# Patient Record
Sex: Male | Born: 1955 | Race: White | Hispanic: No | Marital: Single | Smoking: Former smoker
Health system: Southern US, Community
[De-identification: ages and names within clinical notes are randomized; demographics above are authoritative.]

## PROBLEM LIST (undated history)

## (undated) DIAGNOSIS — J40 Bronchitis, not specified as acute or chronic: Secondary | ICD-10-CM

## (undated) DIAGNOSIS — J449 Chronic obstructive pulmonary disease, unspecified: Secondary | ICD-10-CM

## (undated) DIAGNOSIS — G4730 Sleep apnea, unspecified: Secondary | ICD-10-CM

## (undated) DIAGNOSIS — I509 Heart failure, unspecified: Secondary | ICD-10-CM

## (undated) DIAGNOSIS — I10 Essential (primary) hypertension: Secondary | ICD-10-CM

## (undated) DIAGNOSIS — E119 Type 2 diabetes mellitus without complications: Secondary | ICD-10-CM

## (undated) DIAGNOSIS — E1142 Type 2 diabetes mellitus with diabetic polyneuropathy: Secondary | ICD-10-CM

## (undated) DIAGNOSIS — E7800 Pure hypercholesterolemia, unspecified: Secondary | ICD-10-CM

## (undated) DIAGNOSIS — G629 Polyneuropathy, unspecified: Secondary | ICD-10-CM

## (undated) HISTORY — DX: Chronic obstructive pulmonary disease, unspecified: J44.9

## (undated) HISTORY — PX: FEMUR SURGERY: SHX943

## (undated) HISTORY — PX: HAND SURGERY: SHX662

## (undated) HISTORY — PX: APPENDECTOMY: SHX54

## (undated) HISTORY — PX: TRACHEOSTOMY: SUR1362

## (undated) HISTORY — DX: Type 2 diabetes mellitus with diabetic polyneuropathy: E11.42

---

## 1998-07-15 ENCOUNTER — Encounter: Payer: Self-pay | Admitting: Emergency Medicine

## 1998-07-15 ENCOUNTER — Inpatient Hospital Stay: Admission: EM | Admit: 1998-07-15 | Discharge: 1998-08-06 | Payer: Self-pay | Admitting: Emergency Medicine

## 1998-07-16 ENCOUNTER — Encounter: Payer: Self-pay | Admitting: Pulmonary Disease

## 1998-07-17 ENCOUNTER — Encounter: Payer: Self-pay | Admitting: Pulmonary Disease

## 1998-07-19 ENCOUNTER — Encounter: Payer: Self-pay | Admitting: *Deleted

## 1998-07-20 ENCOUNTER — Encounter: Payer: Self-pay | Admitting: Pulmonary Disease

## 1998-10-08 ENCOUNTER — Encounter: Payer: Self-pay | Admitting: Emergency Medicine

## 1998-10-08 ENCOUNTER — Emergency Department (HOSPITAL_COMMUNITY): Admission: EM | Admit: 1998-10-08 | Discharge: 1998-10-08 | Payer: Self-pay | Admitting: Emergency Medicine

## 1998-11-08 ENCOUNTER — Ambulatory Visit (HOSPITAL_COMMUNITY): Admission: RE | Admit: 1998-11-08 | Discharge: 1998-11-08 | Payer: Self-pay | Admitting: *Deleted

## 1999-04-30 ENCOUNTER — Ambulatory Visit (HOSPITAL_COMMUNITY): Admission: RE | Admit: 1999-04-30 | Discharge: 1999-04-30 | Payer: Self-pay | Admitting: *Deleted

## 1999-04-30 ENCOUNTER — Encounter: Payer: Self-pay | Admitting: *Deleted

## 1999-05-24 ENCOUNTER — Emergency Department (HOSPITAL_COMMUNITY): Admission: EM | Admit: 1999-05-24 | Discharge: 1999-05-24 | Payer: Self-pay | Admitting: Emergency Medicine

## 1999-06-02 ENCOUNTER — Emergency Department (HOSPITAL_COMMUNITY): Admission: EM | Admit: 1999-06-02 | Discharge: 1999-06-02 | Payer: Self-pay | Admitting: Emergency Medicine

## 1999-07-15 ENCOUNTER — Emergency Department (HOSPITAL_COMMUNITY): Admission: EM | Admit: 1999-07-15 | Discharge: 1999-07-15 | Payer: Self-pay | Admitting: Emergency Medicine

## 2001-06-26 ENCOUNTER — Emergency Department (HOSPITAL_COMMUNITY): Admission: EM | Admit: 2001-06-26 | Discharge: 2001-06-26 | Payer: Self-pay | Admitting: Emergency Medicine

## 2001-06-26 ENCOUNTER — Encounter: Payer: Self-pay | Admitting: Emergency Medicine

## 2002-04-02 ENCOUNTER — Emergency Department (HOSPITAL_COMMUNITY): Admission: EM | Admit: 2002-04-02 | Discharge: 2002-04-02 | Payer: Self-pay | Admitting: Emergency Medicine

## 2003-03-23 ENCOUNTER — Emergency Department (HOSPITAL_COMMUNITY): Admission: EM | Admit: 2003-03-23 | Discharge: 2003-03-24 | Payer: Self-pay | Admitting: Emergency Medicine

## 2003-04-03 ENCOUNTER — Inpatient Hospital Stay (HOSPITAL_COMMUNITY): Admission: AC | Admit: 2003-04-03 | Discharge: 2003-04-19 | Payer: Self-pay

## 2003-04-24 ENCOUNTER — Encounter: Admission: RE | Admit: 2003-04-24 | Discharge: 2003-07-23 | Payer: Self-pay | Admitting: Emergency Medicine

## 2003-06-22 ENCOUNTER — Encounter: Admission: RE | Admit: 2003-06-22 | Discharge: 2003-09-20 | Payer: Self-pay | Admitting: Orthopedic Surgery

## 2003-09-01 ENCOUNTER — Ambulatory Visit (HOSPITAL_COMMUNITY): Admission: RE | Admit: 2003-09-01 | Discharge: 2003-09-02 | Payer: Self-pay | Admitting: Orthopedic Surgery

## 2003-09-01 HISTORY — PX: FEMUR HARDWARE REMOVAL: SUR1124

## 2003-09-21 ENCOUNTER — Encounter: Admission: RE | Admit: 2003-09-21 | Discharge: 2003-10-27 | Payer: Self-pay | Admitting: Orthopedic Surgery

## 2007-06-09 ENCOUNTER — Encounter: Payer: Self-pay | Admitting: Emergency Medicine

## 2007-06-09 ENCOUNTER — Ambulatory Visit: Admission: RE | Admit: 2007-06-09 | Discharge: 2007-06-09 | Payer: Self-pay | Admitting: Emergency Medicine

## 2007-06-09 ENCOUNTER — Ambulatory Visit: Payer: Self-pay | Admitting: *Deleted

## 2009-03-21 ENCOUNTER — Ambulatory Visit: Payer: Self-pay | Admitting: Family Medicine

## 2009-03-21 ENCOUNTER — Ambulatory Visit: Payer: Self-pay | Admitting: Cardiovascular Disease

## 2009-03-21 ENCOUNTER — Inpatient Hospital Stay (HOSPITAL_COMMUNITY): Admission: EM | Admit: 2009-03-21 | Discharge: 2009-03-24 | Payer: Self-pay | Admitting: Emergency Medicine

## 2009-03-23 ENCOUNTER — Encounter: Payer: Self-pay | Admitting: Family Medicine

## 2009-04-04 ENCOUNTER — Telehealth: Payer: Self-pay | Admitting: Critical Care Medicine

## 2010-04-04 NOTE — Progress Notes (Signed)
Summary: dme equipt  Phone Note Call from Patient   Caller: [email or web address]  Call For: Dayanis Bergquist Summary of Call: home health asking for orders for new humidity set up for trach, air compressor, new concentrator (his 55yrs old),need durable hosp bed pt has gained 123pds since he got his last bed needs new one  Initial call taken by: Oneita Jolly,  April 04, 2009 12:20 PM  Follow-up for Phone Call        I have absolutely no idea who this is,  I have never seen this patient   Follow-up by: Storm Frisk MD,  April 04, 2009 4:17 PM  Additional Follow-up for Phone Call Additional follow up Details #1::        well it was a pt set up in hosp and you consulted on the pt that is all i could find i can tell them to use prim dr if you  want me to Additional Follow-up by: Oneita Jolly,  April 04, 2009 4:37 PM    Additional Follow-up for Phone Call Additional follow up Details #2::    I just dont remember  just give them what they need  pt will need an outpt f/u OV   Follow-up by: Storm Frisk MD,  April 04, 2009 4:40 PM  Additional Follow-up for Phone Call Additional follow up Details #3:: Details for Additional Follow-up Action Taken: orders given to [facility] for the items they requested and pt will be made an appt to flu in office Additional Follow-up by: Oneita Jolly,  April 04, 2009 4:46 PM

## 2010-05-20 LAB — DIFFERENTIAL
Basophils Absolute: 0 10*3/uL (ref 0.0–0.1)
Basophils Relative: 0 % (ref 0–1)
Eosinophils Relative: 1 % (ref 0–5)
Lymphocytes Relative: 14 % (ref 12–46)
Monocytes Absolute: 0.5 10*3/uL (ref 0.1–1.0)

## 2010-05-20 LAB — COMPREHENSIVE METABOLIC PANEL
AST: 30 U/L (ref 0–37)
Alkaline Phosphatase: 95 U/L (ref 39–117)
BUN: 22 mg/dL (ref 6–23)
CO2: 34 mEq/L — ABNORMAL HIGH (ref 19–32)
Chloride: 91 mEq/L — ABNORMAL LOW (ref 96–112)
Creatinine, Ser: 1.01 mg/dL (ref 0.4–1.5)
GFR calc Af Amer: >60 mL/min (ref >60)
GFR calc non Af Amer: >60 mL/min (ref >60)
Potassium: 4.4 mEq/L (ref 3.5–5.1)
Total Bilirubin: 0.6 mg/dL (ref 0.3–1.2)

## 2010-05-20 LAB — BASIC METABOLIC PANEL WITH GFR
BUN: 31 mg/dL — ABNORMAL HIGH (ref 6–23)
CO2: 40 meq/L — ABNORMAL HIGH (ref 19–32)
Calcium: 8.9 mg/dL (ref 8.4–10.5)
Chloride: 90 meq/L — ABNORMAL LOW (ref 96–112)
Creatinine, Ser: 0.85 mg/dL (ref 0.4–1.5)
GFR calc non Af Amer: >60 mL/min
Glucose, Bld: 264 mg/dL — ABNORMAL HIGH (ref 70–99)
Potassium: 4.6 meq/L (ref 3.5–5.1)
Sodium: 136 meq/L (ref 135–145)

## 2010-05-20 LAB — GLUCOSE, CAPILLARY
Glucose-Capillary: 205 mg/dL — ABNORMAL HIGH (ref 70–99)
Glucose-Capillary: 263 mg/dL — ABNORMAL HIGH (ref 70–99)
Glucose-Capillary: 273 mg/dL — ABNORMAL HIGH (ref 70–99)
Glucose-Capillary: 283 mg/dL — ABNORMAL HIGH (ref 70–99)
Glucose-Capillary: 284 mg/dL — ABNORMAL HIGH (ref 70–99)
Glucose-Capillary: 285 mg/dL — ABNORMAL HIGH (ref 70–99)
Glucose-Capillary: 288 mg/dL — ABNORMAL HIGH (ref 70–99)
Glucose-Capillary: 290 mg/dL — ABNORMAL HIGH (ref 70–99)
Glucose-Capillary: 334 mg/dL — ABNORMAL HIGH (ref 70–99)
Glucose-Capillary: 344 mg/dL — ABNORMAL HIGH (ref 70–99)
Glucose-Capillary: 384 mg/dL — ABNORMAL HIGH (ref 70–99)
Glucose-Capillary: 413 mg/dL — ABNORMAL HIGH (ref 70–99)

## 2010-05-20 LAB — BASIC METABOLIC PANEL
BUN: 40 mg/dL — ABNORMAL HIGH (ref 6–23)
Calcium: 8.8 mg/dL (ref 8.4–10.5)
Chloride: 89 mEq/L — ABNORMAL LOW (ref 96–112)
GFR calc non Af Amer: >60 mL/min (ref >60)
Glucose, Bld: 304 mg/dL — ABNORMAL HIGH (ref 70–99)
Potassium: 5.1 mEq/L (ref 3.5–5.1)
Potassium: 5.1 mEq/L (ref 3.5–5.1)

## 2010-05-20 LAB — CBC
HCT: 44.5 % (ref 39.0–52.0)
HCT: 45.2 % (ref 39.0–52.0)
HCT: 48.3 % (ref 39.0–52.0)
HCT: 49 % (ref 39.0–52.0)
Hemoglobin: 14.6 g/dL (ref 13.0–17.0)
Hemoglobin: 15 g/dL (ref 13.0–17.0)
Hemoglobin: 15.9 g/dL (ref 13.0–17.0)
Hemoglobin: 16.2 g/dL (ref 13.0–17.0)
MCHC: 32.8 g/dL (ref 30.0–36.0)
MCHC: 32.9 g/dL (ref 30.0–36.0)
MCHC: 33.2 g/dL (ref 30.0–36.0)
MCV: 87.8 fL (ref 78.0–100.0)
MCV: 88.9 fL (ref 78.0–100.0)
MCV: 89 fL (ref 78.0–100.0)
MCV: 89.3 fL (ref 78.0–100.0)
Platelets: 181 K/uL (ref 150–400)
Platelets: 185 10*3/uL (ref 150–400)
Platelets: 197 K/uL (ref 150–400)
Platelets: 203 K/uL (ref 150–400)
RBC: 5 MIL/uL (ref 4.22–5.81)
RBC: 5.08 MIL/uL (ref 4.22–5.81)
RBC: 5.42 MIL/uL (ref 4.22–5.81)
RBC: 5.58 MIL/uL (ref 4.22–5.81)
RDW: 17 % — ABNORMAL HIGH (ref 11.5–15.5)
RDW: 17.3 % — ABNORMAL HIGH (ref 11.5–15.5)
RDW: 17.8 % — ABNORMAL HIGH (ref 11.5–15.5)
WBC: 10.7 K/uL — ABNORMAL HIGH (ref 4.0–10.5)
WBC: 12.1 K/uL — ABNORMAL HIGH (ref 4.0–10.5)
WBC: 12.4 K/uL — ABNORMAL HIGH (ref 4.0–10.5)
WBC: 14.9 10*3/uL — ABNORMAL HIGH (ref 4.0–10.5)

## 2010-05-20 LAB — BRAIN NATRIURETIC PEPTIDE: Pro B Natriuretic peptide (BNP): 141 pg/mL — ABNORMAL HIGH (ref 0.0–100.0)

## 2010-05-20 LAB — CULTURE, RESPIRATORY W GRAM STAIN

## 2010-05-20 LAB — POCT I-STAT 3, ART BLOOD GAS (G3+)
Acid-Base Excess: 10 mmol/L — ABNORMAL HIGH (ref 0.0–2.0)
Bicarbonate: 38.7 meq/L — ABNORMAL HIGH (ref 20.0–24.0)
O2 Saturation: 91 %
Patient temperature: 99.5
TCO2: 41 mmol/L (ref 0–100)
pCO2 arterial: 63.1 mmHg (ref 35.0–45.0)
pH, Arterial: 7.398 (ref 7.350–7.450)
pO2, Arterial: 65 mmHg — ABNORMAL LOW (ref 80.0–100.0)

## 2010-05-20 LAB — POCT CARDIAC MARKERS
CKMB, poc: 4.2 ng/mL (ref 1.0–8.0)
Myoglobin, poc: 316 ng/mL (ref 12–200)
Troponin i, poc: <0.05 ng/mL (ref 0.00–0.09)

## 2010-05-20 LAB — HEMOGLOBIN A1C
Hgb A1c MFr Bld: 10 % — ABNORMAL HIGH (ref 4.6–6.1)
Mean Plasma Glucose: 240 mg/dL

## 2010-06-10 ENCOUNTER — Other Ambulatory Visit: Payer: Self-pay | Admitting: Family Medicine

## 2010-07-19 NOTE — Op Note (Signed)
[place]  Patient:    AMANTE, FOMBY                         MRN: [id number] Proc. Date: 04/30/99 Adm. Date:  [id number] Disc. Date: [id number] Attending:  Harmon Pier CC:         Cornelius Moras. Egbert Garibaldi, D.D.S.                           Operative Report  PREOPERATIVE DIAGNOSIS:  Dental caries, teeth numbers 1, 2, 3, 4, 13, 14, 15, 16K and 32, impacted tooth #17, sleep apnea.  POSTOPERATIVE DIAGNOSIS:  Dental caries, teeth numbers 1, 2, 3, 4, 13, 14, 15, 6K and 32, impacted tooth #17, sleep apnea.  OPERATION PERFORMED:  Surgical extraction of teeth numbers 1, 2, 3, 4, 13, 14, 5, 16, 17, 32 and K.  SURGEON:  Scott R. Egbert Garibaldi, D.D.S.  ANESTHESIA:  General tracheostomy tube.  PREOPERATIVE MEDICINES:  Ancef 1 gm IV piggyback.  INDICATIONS FOR PROCEDURE:  Antavious is a 55 year old Caucasian male referred by ____________ for removal of multiple teeth.  Marks teeth have been hurting on and off for some time.  He does have a history of sleep apnea and obesity, and congestive heart failure.  He has a permanent tracheostomy site.  PAST MEDICAL HISTORY:  No known drug allergies.  MEDICATIONS ON ADMISSION:  Lasix, Prilosec, guanefesin.  HOSPITALIZATIONS:  Numerous surgeries but no joints replacements or valve replacements reported.  He has had a tracheostomy site placed by Dr. Carlena Sax for his sleep apnea disorder.  He does have a history of obesity, congestive heart failure, and sleep apnea as reported.  CLINICAL EXAM:  Extraoral.  A large 55 year old Caucasian male.  Intraoral. Gross caries in numbers 1, 2, 3, 4, 13, 14, 15 and 16 K.  Panorex revealed impacted tooth #17.  Decision was made to do Marks procedure in the main operating room due to  his sleep apnea and obesity conditions.  DESCRIPTION OF PROCEDURE:  The patient was brought to the main operating room, placed in supine position.  The tracheostomy tube was then changed to  a corrugated tracheostomy tube with a cuff on and anesthesia was then induced via IV and inhalational techniques.  At this point the patient was prepped and draped in standard fashion for an intraoral maxillofacial surgical procedure and the oral  cavity was then suctioned and throat pack was placed.  2% Xylocaine with 1:100,000 epinephrine total of 8 cc was given bilateral to inferior alveolar, along the buccal posterior superior alveolar, greater palatine an minor buccal infiltration was done in the maxillary right and left regions.  A #15 blade was then used to  incise from tooth #1 to tooth #4 area where a periosteal elevator was used to reflect the buccal tissues gently.  A #8 round bur under copious normal saline irrigation was then used for minor osseous reduction around tooth numbers 1, 2, 3, and 4.  Using a straight elevator and an upper anatomic forcep for tooth #3, the tooth was then removed.  The palatal root and the mesobuccal root were fractured an using a surgical handpiece, copious normal saline irrigation, minor osseous reduction was then done and a periosteal elevator was used to retrieve the mesial buccal and the palatal roots without complications.  Tooth numbers 1, 2 and 4 were then removed using a straight elevator and a ronguer.  A minor alveoloplasty was then done.  Curettage, irrigation and suture with 3-0 chromic gut suture x 4. t this point the attention was then drawn to the low right side where a full thickness mucoperiosteal flap was advanced for teeth numbers 31 and 32.  Then using a handpiece under copious normal saline irrigation, a trough was then cut on on the buccal tooth 32 at which point the [facility] elevator was used to elevate tooth #32 out of the socket.  Once again, curettage followed and then using a rongeur and light irrigation one 3-0 chromic gut was placed on the patients right side.  At this point the bite block was then transferred  from the patients left side to the right side and the upper left region then addressed.  Using a #15 blade, an incision as then done from tooth #16 to tooth 13 area on the buccal and a periosteal elevator was used to retract the buccal tissue without complications.  Using a #8 round ur and a copious amount of saline irrigation, a buccal trial was then cut from tooth numbers 13, 14, 15 and 16 using a straight elevator and a rongeur.  Tooth numbers 13, 15, and 16 were then removed in segments without problems.  Using a straight elevator followed by upper left hand ____________ forcep tooth #14 was then removed without complications.  Alveoloplasty was then done followed by copious  normal saline irrigation and continuous interlocking sutures then placed in the  patients upper left region.  At this point attention was then drawn to tooth K, at which point a full thickness mucoperiosteal flap was then elevated using a #15 blade and periosteal and using a straight elevator, tooth #K was removed.  A small portion of mesial root was noted to be fractured into the socket at which point a #8 round bur was used to further remove osseous structure, then elevation of a small fragment was then done with a periosteal elevator. Normal saline irrigation followed and then a 3-0 chromic gut suture was then placed.  At this point tooth #17 was then addressed.  A full thickness mucoperiosteal flap was then done from 17 to 18 and a copious normal saline irrigation was then used with a #8 round bur nd a buccal trough was cut around tooth #17.  Minor elevation was then done and tooth #17 was removed without complication.  Curettage followed and irrigation and then one 3-0 chromic gut in a figure-of-eight fashion was then done in this region. The surgical procedure was then found to be complete.  The oral cavity was suctioned, the throat pack was removed and Vaseline was placed on the patients  lips. The patient was then transferred over to his normal uncuffed trach tube and transported to recovery room.  Estimated blood loss for this procedure was 15 cc.  Urine output due to void.  Fluid replacement approximately 500 cc. DD:  04/30/99  TD:  04/30/99 Job: [id number]

## 2010-07-19 NOTE — Op Note (Signed)
NAME:  Tyrone Cunningham, OHLIN                            ACCOUNT NO.:  [id number]   MEDICAL RECORD NO.:  [id number]                   PATIENT TYPE:  OIB   LOCATION:  5019                                 FACILITY:  [facility]   PHYSICIAN:  Elana Alm. Thurston Hole, M.D.              DATE OF BIRTH:  04-14-1955   DATE OF PROCEDURE:  09/01/2003  DATE OF DISCHARGE:                                 OPERATIVE REPORT   PREOPERATIVE DIAGNOSIS:  Retained hardware, left distal femur.   POSTOPERATIVE DIAGNOSIS:  Retained hardware, left distal femur.   PROCEDURE:  Removal of retained hardware, two interlocking distal femoral  rod screws.   SURGEON:  Elana Alm. Thurston Hole, M.D.   ASSISTANT:  Julien Girt, P.A.   ANESTHESIA:  General.   Operative time of 30 minutes.   COMPLICATIONS:  None.   DESCRIPTION OF PROCEDURE:  Mr. Allende was brought to the operating room on  September 01, 2003, and placed on the operating table in supine position.  After  an adequate level of general anesthesia was obtained, he received Ancef 1 g  IV preoperatively for prophylaxis.  His left knee was examined under  anesthesia.  Range of motion 0-120 degrees and knee stable to ligamentous  exam.  Left leg was prepped using sterile Duraprep and draped using sterile  technique after a Foley catheter was placed under sterile conditions.  Initially using fluoroscopy and through his previous distal lateral  incision, a new 3-4 cm incision was made.  The underlying subcutaneous  tissues were incised along with the skin incision.  Using fluoroscopic  control, the two distal interlocking femoral rod screws were removed in  order to dynamize the femoral rod and help improve healing of the femoral  shaft fracture.  These were removed without complications.  The wound was  then irrigated and closed using 2-0 Vicryl and skin staples.  Sterile  dressings were applied.  The patient was then awakened and extubated and  taken to the recovery room in stable  condition.  Needle and sponge counts  correct x2 at the end of the case.                                               Robert A. Thurston Hole, M.D.    RAW/MEDQ  D:  09/01/2003  T:  09/02/2003  Job:  [id number]

## 2010-08-14 ENCOUNTER — Other Ambulatory Visit: Payer: Self-pay | Admitting: Family Medicine

## 2011-02-18 ENCOUNTER — Ambulatory Visit (INDEPENDENT_AMBULATORY_CARE_PROVIDER_SITE_OTHER): Payer: Medicare Other | Admitting: Emergency Medicine

## 2011-02-18 ENCOUNTER — Ambulatory Visit: Payer: Self-pay | Admitting: Emergency Medicine

## 2011-02-18 DIAGNOSIS — R070 Pain in throat: Secondary | ICD-10-CM

## 2011-02-18 DIAGNOSIS — E119 Type 2 diabetes mellitus without complications: Secondary | ICD-10-CM

## 2011-03-05 ENCOUNTER — Ambulatory Visit (INDEPENDENT_AMBULATORY_CARE_PROVIDER_SITE_OTHER): Payer: Medicare Other

## 2011-03-05 DIAGNOSIS — E1065 Type 1 diabetes mellitus with hyperglycemia: Secondary | ICD-10-CM | POA: Diagnosis not present

## 2011-03-05 DIAGNOSIS — R070 Pain in throat: Secondary | ICD-10-CM | POA: Diagnosis not present

## 2011-03-05 DIAGNOSIS — E119 Type 2 diabetes mellitus without complications: Secondary | ICD-10-CM | POA: Diagnosis not present

## 2011-03-05 DIAGNOSIS — R05 Cough: Secondary | ICD-10-CM

## 2011-03-09 ENCOUNTER — Emergency Department (HOSPITAL_COMMUNITY): Payer: Medicare Other

## 2011-03-09 ENCOUNTER — Inpatient Hospital Stay (HOSPITAL_COMMUNITY)
Admission: EM | Admit: 2011-03-09 | Discharge: 2011-03-15 | DRG: 389 | Disposition: A | Discharge status: Home / Self Care | Payer: Medicare Other | Source: Ambulatory Visit | Attending: Family Medicine | Admitting: Family Medicine

## 2011-03-09 ENCOUNTER — Ambulatory Visit (INDEPENDENT_AMBULATORY_CARE_PROVIDER_SITE_OTHER): Payer: Medicare Other

## 2011-03-09 ENCOUNTER — Other Ambulatory Visit: Payer: Self-pay

## 2011-03-09 ENCOUNTER — Encounter: Payer: Self-pay | Admitting: Emergency Medicine

## 2011-03-09 DIAGNOSIS — I10 Essential (primary) hypertension: Secondary | ICD-10-CM | POA: Diagnosis present

## 2011-03-09 DIAGNOSIS — K565 Intestinal adhesions [bands], unspecified as to partial versus complete obstruction: Secondary | ICD-10-CM | POA: Diagnosis present

## 2011-03-09 DIAGNOSIS — K828 Other specified diseases of gallbladder: Secondary | ICD-10-CM | POA: Diagnosis present

## 2011-03-09 DIAGNOSIS — K5900 Constipation, unspecified: Secondary | ICD-10-CM | POA: Diagnosis present

## 2011-03-09 DIAGNOSIS — H9190 Unspecified hearing loss, unspecified ear: Secondary | ICD-10-CM | POA: Diagnosis present

## 2011-03-09 DIAGNOSIS — K56609 Unspecified intestinal obstruction, unspecified as to partial versus complete obstruction: Secondary | ICD-10-CM | POA: Diagnosis present

## 2011-03-09 DIAGNOSIS — K7689 Other specified diseases of liver: Secondary | ICD-10-CM | POA: Diagnosis present

## 2011-03-09 DIAGNOSIS — R0902 Hypoxemia: Secondary | ICD-10-CM | POA: Diagnosis not present

## 2011-03-09 DIAGNOSIS — Z09 Encounter for follow-up examination after completed treatment for conditions other than malignant neoplasm: Secondary | ICD-10-CM | POA: Diagnosis not present

## 2011-03-09 DIAGNOSIS — R4589 Other symptoms and signs involving emotional state: Secondary | ICD-10-CM | POA: Diagnosis present

## 2011-03-09 DIAGNOSIS — Z87891 Personal history of nicotine dependence: Secondary | ICD-10-CM | POA: Diagnosis not present

## 2011-03-09 DIAGNOSIS — G4730 Sleep apnea, unspecified: Secondary | ICD-10-CM | POA: Diagnosis present

## 2011-03-09 DIAGNOSIS — E860 Dehydration: Secondary | ICD-10-CM | POA: Diagnosis not present

## 2011-03-09 DIAGNOSIS — K3189 Other diseases of stomach and duodenum: Secondary | ICD-10-CM | POA: Diagnosis present

## 2011-03-09 DIAGNOSIS — Z23 Encounter for immunization: Secondary | ICD-10-CM | POA: Diagnosis not present

## 2011-03-09 DIAGNOSIS — R509 Fever, unspecified: Secondary | ICD-10-CM

## 2011-03-09 DIAGNOSIS — E119 Type 2 diabetes mellitus without complications: Secondary | ICD-10-CM | POA: Diagnosis present

## 2011-03-09 DIAGNOSIS — I509 Heart failure, unspecified: Secondary | ICD-10-CM | POA: Diagnosis not present

## 2011-03-09 DIAGNOSIS — Z794 Long term (current) use of insulin: Secondary | ICD-10-CM | POA: Diagnosis not present

## 2011-03-09 DIAGNOSIS — R142 Eructation: Secondary | ICD-10-CM | POA: Diagnosis not present

## 2011-03-09 DIAGNOSIS — I517 Cardiomegaly: Secondary | ICD-10-CM | POA: Diagnosis not present

## 2011-03-09 DIAGNOSIS — E7800 Pure hypercholesterolemia, unspecified: Secondary | ICD-10-CM | POA: Diagnosis present

## 2011-03-09 DIAGNOSIS — Z930 Tracheostomy status: Secondary | ICD-10-CM

## 2011-03-09 DIAGNOSIS — J029 Acute pharyngitis, unspecified: Secondary | ICD-10-CM | POA: Diagnosis not present

## 2011-03-09 DIAGNOSIS — J8410 Pulmonary fibrosis, unspecified: Secondary | ICD-10-CM | POA: Diagnosis present

## 2011-03-09 DIAGNOSIS — G609 Hereditary and idiopathic neuropathy, unspecified: Secondary | ICD-10-CM | POA: Diagnosis present

## 2011-03-09 DIAGNOSIS — R141 Gas pain: Secondary | ICD-10-CM | POA: Diagnosis not present

## 2011-03-09 DIAGNOSIS — Z6841 Body Mass Index (BMI) 40.0 and over, adult: Secondary | ICD-10-CM

## 2011-03-09 DIAGNOSIS — G8929 Other chronic pain: Secondary | ICD-10-CM | POA: Diagnosis present

## 2011-03-09 DIAGNOSIS — J40 Bronchitis, not specified as acute or chronic: Secondary | ICD-10-CM | POA: Diagnosis not present

## 2011-03-09 DIAGNOSIS — E785 Hyperlipidemia, unspecified: Secondary | ICD-10-CM | POA: Diagnosis present

## 2011-03-09 DIAGNOSIS — R109 Unspecified abdominal pain: Secondary | ICD-10-CM | POA: Diagnosis not present

## 2011-03-09 HISTORY — DX: Heart failure, unspecified: I50.9

## 2011-03-09 HISTORY — DX: Bronchitis, not specified as acute or chronic: J40

## 2011-03-09 HISTORY — DX: Sleep apnea, unspecified: G47.30

## 2011-03-09 HISTORY — DX: Essential (primary) hypertension: I10

## 2011-03-09 HISTORY — DX: Polyneuropathy, unspecified: G62.9

## 2011-03-09 HISTORY — DX: Pure hypercholesterolemia, unspecified: E78.00

## 2011-03-09 LAB — COMPREHENSIVE METABOLIC PANEL
ALT: 23 U/L (ref 0–53)
AST: 17 U/L (ref 0–37)
Alkaline Phosphatase: 72 U/L (ref 39–117)
Calcium: 10.9 mg/dL — ABNORMAL HIGH (ref 8.4–10.5)
GFR calc Af Amer: >90 mL/min (ref >90)
Glucose, Bld: 241 mg/dL — ABNORMAL HIGH (ref 70–99)
Potassium: 4.3 mEq/L (ref 3.5–5.1)
Sodium: 135 mEq/L (ref 135–145)
Total Protein: 8.7 g/dL — ABNORMAL HIGH (ref 6.0–8.3)

## 2011-03-09 LAB — DIFFERENTIAL
Basophils Absolute: 0 10*3/uL (ref 0.0–0.1)
Eosinophils Absolute: 0 10*3/uL (ref 0.0–0.7)
Lymphocytes Relative: 8 % — ABNORMAL LOW (ref 12–46)
Lymphs Abs: 1.1 10*3/uL (ref 0.7–4.0)
Neutrophils Relative %: 89 % — ABNORMAL HIGH (ref 43–77)

## 2011-03-09 LAB — CBC
Platelets: 197 10*3/uL (ref 150–400)
RBC: 5.36 MIL/uL (ref 4.22–5.81)
RDW: 14.9 % (ref 11.5–15.5)
WBC: 13.7 10*3/uL — ABNORMAL HIGH (ref 4.0–10.5)

## 2011-03-09 MED ORDER — HYDROMORPHONE HCL PF 1 MG/ML IJ SOLN
1.0000 mg | Freq: Once | INTRAMUSCULAR | Status: AC
  Administered 2011-03-09: 1 mg via INTRAVENOUS
  Filled 2011-03-09: qty 1

## 2011-03-09 MED ORDER — ONDANSETRON HCL 4 MG PO TABS: 4.0000 mg | ORAL_TABLET | Freq: Four times a day (QID) | ORAL | Status: DC | PRN

## 2011-03-09 MED ORDER — IOHEXOL 300 MG/ML  SOLN
40.0000 mL | Freq: Once | INTRAMUSCULAR | Status: AC | PRN
  Administered 2011-03-09: 40 mL via ORAL

## 2011-03-09 MED ORDER — ONDANSETRON HCL 4 MG/2ML IJ SOLN
4.0000 mg | Freq: Once | INTRAMUSCULAR | Status: AC
  Administered 2011-03-09: 4 mg via INTRAVENOUS
  Filled 2011-03-09: qty 2

## 2011-03-09 MED ORDER — SODIUM CHLORIDE 0.9 % IV BOLUS (SEPSIS)
500.0000 mL | INTRAVENOUS | Status: AC
  Administered 2011-03-09: 500 mL via INTRAVENOUS

## 2011-03-09 MED ORDER — GABAPENTIN 300 MG PO CAPS
300.0000 mg | ORAL_CAPSULE | Freq: Three times a day (TID) | ORAL | Status: DC
  Administered 2011-03-10 (×2): 300 mg via ORAL
  Filled 2011-03-09 (×4): qty 1

## 2011-03-09 MED ORDER — SODIUM CHLORIDE 0.9 % IV SOLN: Freq: Once | INTRAVENOUS | Status: DC

## 2011-03-09 MED ORDER — IOHEXOL 300 MG/ML  SOLN
100.0000 mL | Freq: Once | INTRAMUSCULAR | Status: AC | PRN
  Administered 2011-03-09: 100 mL via INTRAVENOUS

## 2011-03-09 MED ORDER — INSULIN ASPART 100 UNIT/ML ~~LOC~~ SOLN
0.0000 [IU] | Freq: Three times a day (TID) | SUBCUTANEOUS | Status: DC
  Administered 2011-03-10 (×2): 3 [IU] via SUBCUTANEOUS
  Filled 2011-03-09: qty 3

## 2011-03-09 MED ORDER — PANTOPRAZOLE SODIUM 40 MG IV SOLR
40.0000 mg | INTRAVENOUS | Status: DC
  Administered 2011-03-10 – 2011-03-11 (×3): 40 mg via INTRAVENOUS
  Filled 2011-03-09 (×4): qty 40

## 2011-03-09 MED ORDER — PNEUMOCOCCAL VAC POLYVALENT 25 MCG/0.5ML IJ INJ
0.5000 mL | INJECTION | INTRAMUSCULAR | Status: AC
  Administered 2011-03-10: 0.5 mL via INTRAMUSCULAR
  Filled 2011-03-09: qty 0.5

## 2011-03-09 MED ORDER — FLUTICASONE-SALMETEROL 500-50 MCG/DOSE IN AEPB
1.0000 | INHALATION_SPRAY | Freq: Two times a day (BID) | RESPIRATORY_TRACT | Status: DC
  Administered 2011-03-09 – 2011-03-15 (×12): 1 via RESPIRATORY_TRACT
  Filled 2011-03-09: qty 14

## 2011-03-09 MED ORDER — HEPARIN SODIUM (PORCINE) 5000 UNIT/ML IJ SOLN
5000.0000 [IU] | Freq: Three times a day (TID) | INTRAMUSCULAR | Status: DC
  Administered 2011-03-10 – 2011-03-15 (×16): 5000 [IU] via SUBCUTANEOUS
  Filled 2011-03-09 (×19): qty 1

## 2011-03-09 MED ORDER — FUROSEMIDE 40 MG PO TABS
40.0000 mg | ORAL_TABLET | Freq: Every day | ORAL | Status: DC
  Administered 2011-03-10: 40 mg via ORAL
  Filled 2011-03-09: qty 1

## 2011-03-09 MED ORDER — ONDANSETRON HCL 4 MG/2ML IJ SOLN: 4.0000 mg | Freq: Four times a day (QID) | INTRAMUSCULAR | Status: DC | PRN

## 2011-03-09 MED ORDER — LISINOPRIL 5 MG PO TABS
5.0000 mg | ORAL_TABLET | Freq: Every day | ORAL | Status: DC
  Administered 2011-03-10: 5 mg via ORAL
  Filled 2011-03-09: qty 1

## 2011-03-09 MED ORDER — ALBUTEROL SULFATE HFA 108 (90 BASE) MCG/ACT IN AERS
2.0000 | INHALATION_SPRAY | RESPIRATORY_TRACT | Status: DC | PRN
  Administered 2011-03-09: 2 via RESPIRATORY_TRACT
  Filled 2011-03-09: qty 6.7

## 2011-03-09 MED ORDER — SODIUM CHLORIDE 0.9 % IV SOLN
INTRAVENOUS | Status: DC
  Administered 2011-03-09 – 2011-03-13 (×7): via INTRAVENOUS

## 2011-03-09 MED ORDER — ALBUTEROL SULFATE (5 MG/ML) 0.5% IN NEBU
INHALATION_SOLUTION | RESPIRATORY_TRACT | Status: AC
  Administered 2011-03-09: 19:00:00
  Filled 2011-03-09: qty 1

## 2011-03-09 MED ORDER — SODIUM CHLORIDE 0.9 % IV BOLUS (SEPSIS)
1000.0000 mL | Freq: Once | INTRAVENOUS | Status: AC
  Administered 2011-03-09: 1000 mL via INTRAVENOUS

## 2011-03-09 MED ORDER — HYDROCODONE-ACETAMINOPHEN 5-325 MG PO TABS: 1.0000 | ORAL_TABLET | Freq: Four times a day (QID) | ORAL | Status: DC | PRN

## 2011-03-09 MED ORDER — HYDROMORPHONE HCL PF 1 MG/ML IJ SOLN
1.0000 mg | INTRAMUSCULAR | Status: DC | PRN
  Administered 2011-03-10 – 2011-03-12 (×14): 1 mg via INTRAVENOUS
  Filled 2011-03-09 (×15): qty 1

## 2011-03-09 NOTE — ED Notes (Signed)
Pt cleans trach per self with home bristled. Thick yellow/gray sputum noted.

## 2011-03-09 NOTE — ED Notes (Signed)
Pt talking, ;aughing states I feel so much better

## 2011-03-09 NOTE — ED Provider Notes (Signed)
Medical screening examination/treatment/procedure(s) were performed by non-physician practitioner and as supervising physician I was immediately available for consultation/collaboration.   Guss Farruggia, MD 03/09/11 1704 

## 2011-03-09 NOTE — ED Notes (Signed)
Pt on continuous SPo2 monitor. Pts primary complaint is abdominal pain. Due to size pt has difficulty find position of comfort in stretcher.Reposition pt frequently per request. Resp informed of pt. PA at bedside on arrival.

## 2011-03-09 NOTE — ED Notes (Signed)
Assumed care of pt.  No distress noted.  Pt reports that he is in pain but is noted to be laughing and sitting up talking with friends.  Skin warm, dry and intact.  Neuro intact.

## 2011-03-09 NOTE — ED Notes (Signed)
Patient transported to X-ray on trach collar with o2 intact.

## 2011-03-09 NOTE — ED Notes (Signed)
Pt on blood pressure cuff and continuous pulse oximetry and NRB w/mask over his trach

## 2011-03-09 NOTE — ED Notes (Signed)
Resp at bedside

## 2011-03-09 NOTE — H&P (Signed)
Tyrone Cunningham is an 56 y.o. male.   PCP[facility] Chief Complaint: abdominal pain HPI: This is a 56 YO Caucasian male presenting with the above.  He first started experiencing abdominal pain on Thursday but did not become severe until late Friday. It has been worsening since then. He last ate yesterday around 6 pm because of the abdominal pain which makes it difficult to eat.  He reports this normally happens every 2-3 years. He usually takes Metamucil and it resolves in the next 1-2 days.  This time, the abdominal pain did not resolve. He saw PCP Dr. Cleta Alberts today who was concerned and sent him to the ED by ambulance.   Last had a bowel movement on Wednesday or Thursday. "It wasn't as good as it usually is" when asked about amount and quality of stool.  Difficulty tolerating liquids as well. Last drank about 12 hours ago.   At the time of this interview, patient feels significantly improved ("100% better") since he first came to the ED. The NGT has helped him the most.   ROS: "Very little bit" of nausea this morning Fevers? "Feels hot and sweaty" Denies any new difficulty breathing Last took home medications this morning  Past Medical History  Diagnosis Date  . Bronchitis   . CHF (congestive heart failure)   . Diabetes mellitus   . Sleep apnea   . Hypertension   . Hypercholesteremia   . Hearing loss   . Peripheral neuropathy   . Sleep apnea     Severe sleep apnea requiring tracheostomy  Chronic pain (back, legs, feet, arms)  Past Surgical History  Procedure Date  . Tracheostomy   . Appendectomy   Repair of deviated septum Ear tubes in the past bilaterally for multiple ear infections Tonsillectomy  History reviewed. No pertinent family history. Social History:  reports that he has quit smoking. He has never used smokeless tobacco. His alcohol and drug histories not on file. Lives alone in [place]. Has housekeeper who comes in occasionally.  Quit smoking  12-13 years ago. Had smoked > 1 ppd. Denies recent illicit drug use. Denies drinking alcohol since 30 years ago.   Allergies: No Known Allergies  Medications Prior to Admission  Medication Dose Route Frequency Provider Last Rate Last Dose  . 0.9 %  sodium chloride infusion   Intravenous Once Grant Fontana, [place] 10 mL/hr at 03/09/11 1559    . albuterol (PROVENTIL) (5 MG/ML) 0.5% nebulizer solution           . HYDROmorphone (DILAUDID) injection 1 mg  1 mg Intravenous Once Grant Fontana, PA   1 mg at 03/09/11 1344  . HYDROmorphone (DILAUDID) injection 1 mg  1 mg Intravenous Once Grant Fontana, PA   1 mg at 03/09/11 1517  . HYDROmorphone (DILAUDID) injection 1 mg  1 mg Intravenous Once Grant Fontana, PA   1 mg at 03/09/11 1647  . HYDROmorphone (DILAUDID) injection 1 mg  1 mg Intravenous Once [employer], PA      . iohexol (OMNIPAQUE) 300 MG/ML solution 100 mL  100 mL Intravenous Once PRN Medication Radiologist   100 mL at 03/09/11 1802  . iohexol (OMNIPAQUE) 300 MG/ML solution 40 mL  40 mL Oral Once PRN Medication Radiologist   40 mL at 03/09/11 1550  . ondansetron (ZOFRAN) injection 4 mg  4 mg Intravenous Once Grant Fontana, [place]   4 mg at 03/09/11 1349  . ondansetron (ZOFRAN) injection 4 mg  4  mg Intravenous Once Grant Fontana, [place]   4 mg at 03/09/11 1645  . sodium chloride 0.9 % bolus 1,000 mL  1,000 mL Intravenous Once Grant Fontana, PA   1,000 mL at 03/09/11 1350  . sodium chloride 0.9 % bolus 500 mL  500 mL Intravenous STAT Jennifer A Pitylak, PA   500 mL at 03/09/11 1758  . DISCONTD: 0.9 %  sodium chloride infusion   Intravenous Once Grant Fontana, [place]       No current outpatient prescriptions on file as of 03/09/2011.    Results for orders placed during the hospital encounter of 03/09/11 (from the past 48 hour(s))  CBC     Status: Abnormal   Collection Time   03/09/11  2:11 PM      Component Value Range Comment   WBC 13.7 (*) 4.0 - 10.5 (K/uL)     RBC 5.36  4.22 - 5.81 (MIL/uL)    Hemoglobin 16.1  13.0 - 17.0 (g/dL)    HCT 16.1  09.6 - 04.5 (%)    MCV 87.9  78.0 - 100.0 (fL)    MCH 30.0  26.0 - 34.0 (pg)    MCHC 34.2  30.0 - 36.0 (g/dL)    RDW 40.9  81.1 - 91.4 (%)    Platelets 197  150 - 400 (K/uL)   DIFFERENTIAL     Status: Abnormal   Collection Time   03/09/11  2:11 PM      Component Value Range Comment   Neutrophils Relative 89 (*) 43 - 77 (%)    Neutro Abs 12.2 (*) 1.7 - 7.7 (K/uL)    Lymphocytes Relative 8 (*) 12 - 46 (%)    Lymphs Abs 1.1  0.7 - 4.0 (K/uL)    Monocytes Relative 3  3 - 12 (%)    Monocytes Absolute 0.3  0.1 - 1.0 (K/uL)    Eosinophils Relative 0  0 - 5 (%)    Eosinophils Absolute 0.0  0.0 - 0.7 (K/uL)    Basophils Relative 0  0 - 1 (%)    Basophils Absolute 0.0  0.0 - 0.1 (K/uL)   COMPREHENSIVE METABOLIC PANEL     Status: Abnormal   Collection Time   03/09/11  2:11 PM      Component Value Range Comment   Sodium 135  135 - 145 (mEq/L)    Potassium 4.3  3.5 - 5.1 (mEq/L)    Chloride 91 (*) 96 - 112 (mEq/L)    CO2 33 (*) 19 - 32 (mEq/L)    Glucose, Bld 241 (*) 70 - 99 (mg/dL)    BUN 28 (*) 6 - 23 (mg/dL)    Creatinine, Ser 7.82  0.50 - 1.35 (mg/dL)    Calcium 95.6 (*) 8.4 - 10.5 (mg/dL)    Total Protein 8.7 (*) 6.0 - 8.3 (g/dL)    Albumin 3.8  3.5 - 5.2 (g/dL)    AST 17  0 - 37 (U/L)    ALT 23  0 - 53 (U/L)    Alkaline Phosphatase 72  39 - 117 (U/L)    Total Bilirubin 0.4  0.3 - 1.2 (mg/dL)    GFR calc non Af Amer >90  >90 (mL/min)    GFR calc Af Amer >90  >90 (mL/min)    Ct Abdomen Pelvis W Contrast  03/09/2011  *RADIOLOGY REPORT*  Clinical Data: Abdominal distention.  CT ABDOMEN AND PELVIS WITH CONTRAST  Technique:  Multidetector CT imaging of the abdomen and pelvis was performed  following the standard protocol during bolus administration of intravenous contrast.  Contrast: OMNIPAQUE IOHEXOL 300 MG/ML IV SOLN  Comparison: Abdominal radiographs 03/09/2011.  Findings: The lung bases are clear.   Morbid obesity.  There is diffuse fatty infiltration of the liver but no focal hepatic lesions or intrahepatic biliary dilatation.  Gallbladder is distended.  No common bile duct dilatation.  The pancreas is normal.  The spleen is normal in size.  No focal lesions.  The adrenal glands and kidneys are unremarkable.  There is marked distention of the stomach, duodenum and proximal small bowel.  There is a transition to normal/decompressed small bowel in the mid abdomen.  This is likely due to adhesions.  No masses seen.  The colon is grossly normal.  No mesenteric or retroperitoneal masses or adenopathy.  There are scattered lymph nodes.  The aorta is normal in caliber.  Moderate atherosclerotic changes, advanced for age.  No retroperitoneal process.  The bladder is distended.  The prostate gland and seminal vesicles are unremarkable.  No pelvic mass, adenopathy or free pelvic fluid collections.  No inguinal mass or hernia.  The bony pelvis is intact.  IMPRESSION:  1.  Morbid obesity. 2.  Proximal small bowel obstruction likely due to adhesions in the mid abdomen. 3.  Diffuse fatty infiltration of the liver and gallbladder distention. 4.  Mild distention of the bladder.  Original Report Authenticated By: P. Loralie Champagne, M.D.   Dg Abd Acute W/chest  03/09/2011  *RADIOLOGY REPORT*  Clinical Data: Abdominal distention.  Mid abdominal pain.  ACUTE ABDOMEN SERIES (ABDOMEN 2 VIEW & CHEST 1 VIEW)  Comparison: Chest dated 03/21/2009.  Findings: Enlarged cardiac silhouette with a mild decrease in size. Diffusely prominent interstitial markings with mild improvement. Tracheostomy tube in satisfactory position.  Stomach distended with gas and fluid.  No bowel dilatation or free peritoneal air. Prominent stool in the colon, especially on the right.  Hardware fixation of the left hip.  IMPRESSION:  1.  Cardiomegaly, chronic bronchitic changes and chronic interstitial lung disease. 2.  Gastric distention.  Original Report  Authenticated By: Darrol Angel, M.D.    Review of Systems  HENT: Negative for sore throat.   Respiratory: Negative for hemoptysis.   Cardiovascular: Negative for chest pain.  Gastrointestinal: Negative for heartburn.  Genitourinary: Negative for dysuria and frequency.    Blood pressure 142/82, pulse 105, temperature 98.6 F (37 C), temperature source Oral, resp. rate 20, SpO2 96.00%. Physical Exam  Gen: NAD, NGT in place, accompanied by brother Viviann Spare), morbidly obese Psych: conversant, engaged, appropriate, not anxious appearing HEENT   [place]/AT   Eyes: normal   Ears: L TM normal; R TM could not be visualized due to cerumen; no otalgia or erythema bilaterally   Nose: no nasal congestion or discharge   Throat: no exudates or erythema   Neck: no LAD CV: RRR Pulm: decreased BS, large chest cavity, CTAB withotu w/r/r, fair air movement Abd: obese, decreased BS, soft, mild-moderate diffuse central tenderness, distended Ext: 1+ pitting pretibial edema bilaterally; 2+ pedal pulses bilaterally Skin: excoriations on abdomen, arms, and legs (patient attributes to his new kitten)  Assessment/Plan This is a 56 YO Caucasian male morbidly obese who is s/p appendectomy presenting with worsening abdominal pain for the past 4 days and constipation with a CT abdomen concerning for proximal small bowel obstructions likely due to adhesions. Surgery has been consulted in the ED and requested we admit the patient for management of his other chronic medical problems.  Small bowel obstruction. -Will f/u Surgery recommendations. Patient reports significant improvement in abdominal pain after NGT placed.  -Will continue Dilaudid prn for abdominal pain.  -Will keep NPO for now.   Respiratory: History of severe sleep apnea now with tracheostomy. He does not use CPAP machine at home. History of ?COPD. He uses albuterol at home. He was wheezing on admission but was not wheezing at the time of my interview  with the patient, after he had received an albuterol neb treatment. -Will continue albuterol nebs q4 prn and follow-up on respiratory therapy recommendations.   CV: History of CHF, HTN. 2D-ECHO 03/2009 was of poor quality due to the patient's body habitus and EF could not be calculated, however, was not thought to be significantly depressed.  -Will continue home anti-hypertensives.  -Will continue fluids @ 75 while NPO. Patient with mild pretibial edema.  FEN/GI -NS @ 75 -NPO  PPx -Heparin SQ for DVT PPx -Protonix IV 40 mg qd  Dispo: Pending surgery recommendations and clinical improvement. Patient's brothers asked to be contacted if any concerning events.    Brother Eberardo Demello. Home number [phone].    Brother Lilli Light. Home number [phone].  CODE: FULL   OH PARK, ANGELA 03/09/2011, 7:25 PM

## 2011-03-09 NOTE — ED Provider Notes (Signed)
5:00 PM patient seen and evaluated, care taken over from [place], [place]. Patient reports that he has had diffuse abdominal pain, worse in the upper quadrant for the past four days. Constipated x 4 days as well. Passing gas. Lives at home. Trachea patient. Coughing. Nausea and vomiting this AM. Hx of abdominal surgery, appendectomy. Diffuse tenderness, distended. No guarding. Diaphoretic.   Results for orders placed during the hospital encounter of 03/09/11  CBC      Component Value Range   WBC 13.7 (*) 4.0 - 10.5 (K/uL)   RBC 5.36  4.22 - 5.81 (MIL/uL)   Hemoglobin 16.1  13.0 - 17.0 (g/dL)   HCT 13.0  86.5 - 78.4 (%)   MCV 87.9  78.0 - 100.0 (fL)   MCH 30.0  26.0 - 34.0 (pg)   MCHC 34.2  30.0 - 36.0 (g/dL)   RDW 69.6  29.5 - 28.4 (%)   Platelets 197  150 - 400 (K/uL)  DIFFERENTIAL      Component Value Range   Neutrophils Relative 89 (*) 43 - 77 (%)   Neutro Abs 12.2 (*) 1.7 - 7.7 (K/uL)   Lymphocytes Relative 8 (*) 12 - 46 (%)   Lymphs Abs 1.1  0.7 - 4.0 (K/uL)   Monocytes Relative 3  3 - 12 (%)   Monocytes Absolute 0.3  0.1 - 1.0 (K/uL)   Eosinophils Relative 0  0 - 5 (%)   Eosinophils Absolute 0.0  0.0 - 0.7 (K/uL)   Basophils Relative 0  0 - 1 (%)   Basophils Absolute 0.0  0.0 - 0.1 (K/uL)  COMPREHENSIVE METABOLIC PANEL      Component Value Range   Sodium 135  135 - 145 (mEq/L)   Potassium 4.3  3.5 - 5.1 (mEq/L)   Chloride 91 (*) 96 - 112 (mEq/L)   CO2 33 (*) 19 - 32 (mEq/L)   Glucose, Bld 241 (*) 70 - 99 (mg/dL)   BUN 28 (*) 6 - 23 (mg/dL)   Creatinine, Ser 1.32  0.50 - 1.35 (mg/dL)   Calcium 44.0 (*) 8.4 - 10.5 (mg/dL)   Total Protein 8.7 (*) 6.0 - 8.3 (g/dL)   Albumin 3.8  3.5 - 5.2 (g/dL)   AST 17  0 - 37 (U/L)   ALT 23  0 - 53 (U/L)   Alkaline Phosphatase 72  39 - 117 (U/L)   Total Bilirubin 0.4  0.3 - 1.2 (mg/dL)   GFR calc non Af Amer >90  >90 (mL/min)   GFR calc Af Amer >90  >90 (mL/min)   Ct Abdomen Pelvis W Contrast  03/09/2011  *RADIOLOGY REPORT*  Clinical  Data: Abdominal distention.  CT ABDOMEN AND PELVIS WITH CONTRAST  Technique:  Multidetector CT imaging of the abdomen and pelvis was performed following the standard protocol during bolus administration of intravenous contrast.  Contrast: OMNIPAQUE IOHEXOL 300 MG/ML IV SOLN  Comparison: Abdominal radiographs 03/09/2011.  Findings: The lung bases are clear.  Morbid obesity.  There is diffuse fatty infiltration of the liver but no focal hepatic lesions or intrahepatic biliary dilatation.  Gallbladder is distended.  No common bile duct dilatation.  The pancreas is normal.  The spleen is normal in size.  No focal lesions.  The adrenal glands and kidneys are unremarkable.  There is marked distention of the stomach, duodenum and proximal small bowel.  There is a transition to normal/decompressed small bowel in the mid abdomen.  This is likely due to adhesions.  No masses seen.  The colon is grossly normal.  No mesenteric or retroperitoneal masses or adenopathy.  There are scattered lymph nodes.  The aorta is normal in caliber.  Moderate atherosclerotic changes, advanced for age.  No retroperitoneal process.  The bladder is distended.  The prostate gland and seminal vesicles are unremarkable.  No pelvic mass, adenopathy or free pelvic fluid collections.  No inguinal mass or hernia.  The bony pelvis is intact.  IMPRESSION:  1.  Morbid obesity. 2.  Proximal small bowel obstruction likely due to adhesions in the mid abdomen. 3.  Diffuse fatty infiltration of the liver and gallbladder distention. 4.  Mild distention of the bladder.  Original Report Authenticated By: P. Loralie Champagne, M.D.   Dg Abd Acute W/chest  03/09/2011  *RADIOLOGY REPORT*  Clinical Data: Abdominal distention.  Mid abdominal pain.  ACUTE ABDOMEN SERIES (ABDOMEN 2 VIEW & CHEST 1 VIEW)  Comparison: Chest dated 03/21/2009.  Findings: Enlarged cardiac silhouette with a mild decrease in size. Diffusely prominent interstitial markings with mild  improvement. Tracheostomy tube in satisfactory position.  Stomach distended with gas and fluid.  No bowel dilatation or free peritoneal air. Prominent stool in the colon, especially on the right.  Hardware fixation of the left hip.  IMPRESSION:  1.  Cardiomegaly, chronic bronchitic changes and chronic interstitial lung disease. 2.  Gastric distention.  Original Report Authenticated By: Darrol Angel, M.D.   Patient seen and re-evaluated. Resting comfortably. VSS stable. NAD. Patient notified of testing results. Stated agreement and understanding. Patient stated understanding to treatment plan and diagnosis. NG tube. Pain medication  6:21 PM patient has partial small bowel bowel obstruction, likely due to adhesions. Prior appendectomy. Diffuse tenderness. No free fluid. Continued pain. NG tube placed. Discussed with general surgeon, who will see the patient in the ED. Advised for medicine to admit.   6:51 PM spoke to dr. Cleta Alberts to update him on his status.  6:51 PM spoke to family practice who will admit the patient.   NG tube in place, they removed 3 L of fluid.   Demetrius Charity, [place] 03/09/11 2114

## 2011-03-09 NOTE — ED Notes (Signed)
Pt reports abdominal pain since Thursday. Pt reports vomiting this morning. Pt from [facility] for further eval. Pt presents with abdominal distention. Last normal BM 3-4 days ago.

## 2011-03-09 NOTE — Consult Note (Addendum)
Reason for Consult:sbo Referring Physician: Dr. Lelon Frohlich Tyrone Cunningham is an 56 y.o. male.  HPI: 56 yo wm presents with abd pain since last thurs. He went to medical doc that day for a lung infection and was started on an unknown abx. Felt worse. Developed nausea and vomiting. Minimal gas or bm.  Past Medical History  Diagnosis Date  . Bronchitis   . CHF (congestive heart failure)   . Diabetes mellitus   . Sleep apnea   . Hypertension   . Hypercholesteremia   . Hearing loss   . Peripheral neuropathy   . Sleep apnea     Severe sleep apnea requiring tracheostomy    Past Surgical History  Procedure Date  . Tracheostomy   . Appendectomy     History reviewed. No pertinent family history.  Social History:  reports that he has quit smoking. He has never used smokeless tobacco. His alcohol and drug histories not on file.  Allergies: No Known Allergies  Medications: I have reviewed the patient's current medications.  Results for orders placed during the hospital encounter of 03/09/11 (from the past 48 hour(s))  CBC     Status: Abnormal   Collection Time   03/09/11  2:11 PM      Component Value Range Comment   WBC 13.7 (*) 4.0 - 10.5 (K/uL)    RBC 5.36  4.22 - 5.81 (MIL/uL)    Hemoglobin 16.1  13.0 - 17.0 (g/dL)    HCT 40.9  81.1 - 91.4 (%)    MCV 87.9  78.0 - 100.0 (fL)    MCH 30.0  26.0 - 34.0 (pg)    MCHC 34.2  30.0 - 36.0 (g/dL)    RDW 78.2  95.6 - 21.3 (%)    Platelets 197  150 - 400 (K/uL)   DIFFERENTIAL     Status: Abnormal   Collection Time   03/09/11  2:11 PM      Component Value Range Comment   Neutrophils Relative 89 (*) 43 - 77 (%)    Neutro Abs 12.2 (*) 1.7 - 7.7 (K/uL)    Lymphocytes Relative 8 (*) 12 - 46 (%)    Lymphs Abs 1.1  0.7 - 4.0 (K/uL)    Monocytes Relative 3  3 - 12 (%)    Monocytes Absolute 0.3  0.1 - 1.0 (K/uL)    Eosinophils Relative 0  0 - 5 (%)    Eosinophils Absolute 0.0  0.0 - 0.7 (K/uL)    Basophils Relative 0  0 - 1 (%)    Basophils  Absolute 0.0  0.0 - 0.1 (K/uL)   COMPREHENSIVE METABOLIC PANEL     Status: Abnormal   Collection Time   03/09/11  2:11 PM      Component Value Range Comment   Sodium 135  135 - 145 (mEq/L)    Potassium 4.3  3.5 - 5.1 (mEq/L)    Chloride 91 (*) 96 - 112 (mEq/L)    CO2 33 (*) 19 - 32 (mEq/L)    Glucose, Bld 241 (*) 70 - 99 (mg/dL)    BUN 28 (*) 6 - 23 (mg/dL)    Creatinine, Ser 0.86  0.50 - 1.35 (mg/dL)    Calcium 57.8 (*) 8.4 - 10.5 (mg/dL)    Total Protein 8.7 (*) 6.0 - 8.3 (g/dL)    Albumin 3.8  3.5 - 5.2 (g/dL)    AST 17  0 - 37 (U/L)    ALT 23  0 - 53 (  U/L)    Alkaline Phosphatase 72  39 - 117 (U/L)    Total Bilirubin 0.4  0.3 - 1.2 (mg/dL)    GFR calc non Af Amer >90  >90 (mL/min)    GFR calc Af Amer >90  >90 (mL/min)     Ct Abdomen Pelvis W Contrast  03/09/2011  *RADIOLOGY REPORT*  Clinical Data: Abdominal distention.  CT ABDOMEN AND PELVIS WITH CONTRAST  Technique:  Multidetector CT imaging of the abdomen and pelvis was performed following the standard protocol during bolus administration of intravenous contrast.  Contrast: OMNIPAQUE IOHEXOL 300 MG/ML IV SOLN  Comparison: Abdominal radiographs 03/09/2011.  Findings: The lung bases are clear.  Morbid obesity.  There is diffuse fatty infiltration of the liver but no focal hepatic lesions or intrahepatic biliary dilatation.  Gallbladder is distended.  No common bile duct dilatation.  The pancreas is normal.  The spleen is normal in size.  No focal lesions.  The adrenal glands and kidneys are unremarkable.  There is marked distention of the stomach, duodenum and proximal small bowel.  There is a transition to normal/decompressed small bowel in the mid abdomen.  This is likely due to adhesions.  No masses seen.  The colon is grossly normal.  No mesenteric or retroperitoneal masses or adenopathy.  There are scattered lymph nodes.  The aorta is normal in caliber.  Moderate atherosclerotic changes, advanced for age.  No retroperitoneal  process.  The bladder is distended.  The prostate gland and seminal vesicles are unremarkable.  No pelvic mass, adenopathy or free pelvic fluid collections.  No inguinal mass or hernia.  The bony pelvis is intact.  IMPRESSION:  1.  Morbid obesity. 2.  Proximal small bowel obstruction likely due to adhesions in the mid abdomen. 3.  Diffuse fatty infiltration of the liver and gallbladder distention. 4.  Mild distention of the bladder.  Original Report Authenticated By: P. Loralie Champagne, M.D.   Dg Abd Acute W/chest  03/09/2011  *RADIOLOGY REPORT*  Clinical Data: Abdominal distention.  Mid abdominal pain.  ACUTE ABDOMEN SERIES (ABDOMEN 2 VIEW & CHEST 1 VIEW)  Comparison: Chest dated 03/21/2009.  Findings: Enlarged cardiac silhouette with a mild decrease in size. Diffusely prominent interstitial markings with mild improvement. Tracheostomy tube in satisfactory position.  Stomach distended with gas and fluid.  No bowel dilatation or free peritoneal air. Prominent stool in the colon, especially on the right.  Hardware fixation of the left hip.  IMPRESSION:  1.  Cardiomegaly, chronic bronchitic changes and chronic interstitial lung disease. 2.  Gastric distention.  Original Report Authenticated By: Darrol Angel, M.D.    Review of Systems  Constitutional: Negative.   HENT: Negative.   Eyes: Negative.   Respiratory: Positive for shortness of breath.   Cardiovascular: Negative.   Gastrointestinal: Positive for nausea, vomiting and abdominal pain.  Genitourinary: Negative.   Musculoskeletal: Negative.   Skin: Negative.   Neurological: Negative.   Endo/Heme/Allergies: Negative.   Psychiatric/Behavioral: Negative.    Blood pressure 108/64, pulse 105, temperature 98.6 F (37 C), temperature source Oral, resp. rate 20, SpO2 98.00%. Physical Exam  Constitutional: He is oriented to person, place, and time.       Morbidly obese  HENT:  Head: Normocephalic and atraumatic.  Eyes: Conjunctivae and EOM are  normal. Pupils are equal, round, and reactive to light.  Neck:       trached  Cardiovascular: Normal rate, regular rhythm and normal heart sounds.   Respiratory: Effort normal and breath  sounds normal.  GI: Soft. He exhibits distension. He exhibits no mass. There is no tenderness.  Musculoskeletal: Normal range of motion.  Neurological: He is alert and oriented to person, place, and time.  Skin: Skin is warm and dry.  Psychiatric: He has a normal mood and affect. His behavior is normal.    Assessment/Plan: sbo by ct. Most likely etiology would be adhesions. Agree with ng tube and bowel rest. Repeat abd xrays in am. Will follow  TOTH III,PAUL S 03/09/2011, 9:34 PM

## 2011-03-09 NOTE — ED Notes (Signed)
Pt sitting upright on side of bed for comfort. Pt suctioned with minimal thick secretions noted. Pt states feeling better. Pt dropped trach brush on floor and attempted to reuse. Gave pt new trach care kit, educate on infection control. Pt verbalized understanding. Pt drinking contrast.

## 2011-03-09 NOTE — ED Provider Notes (Signed)
History     CSN: [id number]  Arrival date & time 03/09/11  1304   First MD Initiated Contact with Patient 03/09/11 1307      Chief Complaint  Patient presents with  . Abdominal Pain    (Consider location/radiation/quality/duration/timing/severity/associated sxs/prior treatment) Patient is a 56 y.o. male presenting with abdominal pain. The history is provided by the patient and medical records.  Abdominal Pain The primary symptoms of the illness include abdominal pain, nausea and vomiting. The primary symptoms of the illness do not include fever, fatigue, shortness of breath, diarrhea, hematemesis, hematochezia or dysuria. The current episode started more than 2 days ago. The onset of the illness was sudden. The problem has not changed since onset. The patient has had a change in bowel habit. Risk factors for an acute abdominal problem include a history of abdominal surgery. Additional symptoms associated with the illness include anorexia and constipation. Symptoms associated with the illness do not include diaphoresis, heartburn, urgency, frequency or back pain. Significant associated medical issues include diabetes.  Patient has had generalized abd pain for the past 4 days. States he has not had a bowel movement for the past 3-4 days. He thinks that he has been able to pass a small amount of flatus but less than usual. Developed nausea and vomiting this morning. No urinary sx. Abdomen feels distended. No known aggravating or alleviating factors for pain. Denies associated chest pain, shortness of breath.  Hx of appendectomy but no other abd surgeries.  He went to urgent care this morning and was sent here for further eval.  He has COPD and has a tracheostomy secondary to severe sleep apnea. Sats by EMS were 88%. He is apparently supposed to be on home O2 but is noncompliant with this.  Past Medical History  Diagnosis Date  . Bronchitis   . CHF (congestive heart failure)   . Diabetes  mellitus   . Sleep apnea   . Hypertension   . Hypercholesteremia   . Hearing loss   . Peripheral neuropathy   . Sleep apnea     Severe sleep apnea requiring tracheostomy    Past Surgical History  Procedure Date  . Tracheostomy   . Appendectomy     History reviewed. No pertinent family history.  History  Substance Use Topics  . Smoking status: Former Games developer  . Smokeless tobacco: Never Used  . Alcohol Use:       Review of Systems  Constitutional: Positive for appetite change. Negative for fever, diaphoresis and fatigue.  HENT: Negative for trouble swallowing.   Respiratory: Negative for chest tightness and shortness of breath.   Cardiovascular: Negative for chest pain and palpitations.  Gastrointestinal: Positive for nausea, vomiting, abdominal pain, constipation, abdominal distention and anorexia. Negative for heartburn, diarrhea, blood in stool, hematochezia, anal bleeding, rectal pain and hematemesis.  Genitourinary: Negative for dysuria, urgency, frequency and difficulty urinating.  Musculoskeletal: Negative for myalgias and back pain.  Neurological: Negative for dizziness and weakness.    Allergies  Review of patient's allergies indicates not on file.  Home Medications   Current Outpatient Rx  Name Route Sig Dispense Refill  . ALBUTEROL SULFATE HFA 108 (90 BASE) MCG/ACT IN AERS Inhalation Inhale 2 puffs into the lungs every 6 (six) hours as needed.      . ATORVASTATIN CALCIUM 10 MG PO TABS Oral Take 10 mg by mouth daily.      Marland Kitchen FLUTICASONE-SALMETEROL 500-50 MCG/DOSE IN AEPB Inhalation Inhale 1 puff into the lungs every 12 (  twelve) hours.      . FUROSEMIDE 40 MG PO TABS Oral Take 40 mg by mouth daily.      Marland Kitchen GABAPENTIN 300 MG PO CAPS Oral Take 300 mg by mouth 3 (three) times daily.      Marland Kitchen HYDROCODONE-ACETAMINOPHEN 5-325 MG PO TABS Oral Take 1 tablet by mouth every 6 (six) hours as needed.     . INSULIN ASPART 100 UNIT/ML [place] SOLN Subcutaneous Inject 6-25 Units into  the skin 4 (four) times daily. On sliding scale    . INSULIN GLARGINE 100 UNIT/ML Ricardo SOLN Subcutaneous Inject 80 Units into the skin every morning.     Marland Kitchen LISINOPRIL 5 MG PO TABS Oral Take 5 mg by mouth daily.        BP 152/86  Pulse 87  Temp(Src) 98.6 F (37 C) (Oral)  Resp 20  SpO2 100%  Physical Exam  Nursing note and vitals reviewed. Constitutional: He appears well-developed and well-nourished. No distress.  HENT:  Head: Normocephalic and atraumatic.       tracheostomy  Eyes: Conjunctivae and EOM are normal.  Neck: Normal range of motion.  Cardiovascular: Normal rate, regular rhythm and normal heart sounds.   Pulmonary/Chest: Effort normal. He has wheezes. He exhibits no tenderness.       Diffuse expiratory wheezing  Abdominal: He exhibits distension. Bowel sounds are decreased. There is generalized tenderness.  Musculoskeletal: Normal range of motion.  Neurological: He is alert.  Skin: Skin is warm and dry. No rash noted. He is not diaphoretic.  Psychiatric: He has a normal mood and affect.    ED Course  Procedures (including critical care time)  Labs Reviewed  CBC - Abnormal; Notable for the following:    WBC 13.7 (*)    All other components within normal limits  DIFFERENTIAL - Abnormal; Notable for the following:    Neutrophils Relative 89 (*)    Neutro Abs 12.2 (*)    Lymphocytes Relative 8 (*)    All other components within normal limits  COMPREHENSIVE METABOLIC PANEL - Abnormal; Notable for the following:    Chloride 91 (*)    CO2 33 (*)    Glucose, Bld 241 (*)    BUN 28 (*)    Calcium 10.9 (*)    Total Protein 8.7 (*)    All other components within normal limits   Dg Abd Acute W/chest  03/09/2011  *RADIOLOGY REPORT*  Clinical Data: Abdominal distention.  Mid abdominal pain.  ACUTE ABDOMEN SERIES (ABDOMEN 2 VIEW & CHEST 1 VIEW)  Comparison: Chest dated 03/21/2009.  Findings: Enlarged cardiac silhouette with a mild decrease in size. Diffusely prominent  interstitial markings with mild improvement. Tracheostomy tube in satisfactory position.  Stomach distended with gas and fluid.  No bowel dilatation or free peritoneal air. Prominent stool in the colon, especially on the right.  Hardware fixation of the left hip.  IMPRESSION:  1.  Cardiomegaly, chronic bronchitic changes and chronic interstitial lung disease. 2.  Gastric distention.  Original Report Authenticated By: Darrol Angel, M.D.     No diagnosis found.    MDM  1:39 PM Pt assessed. Abd distended. Will send for acute abd series then labs.  3:22 PM Acute abd series shows gastric distension with fluid/air but otherwise unremarkable. WBC 13.7. Plan to CT abd/pelvis.  4:54 PM Pt done drinking contrast. Awaiting transport to CT. Signout given to [employer], PA-C. She will dispo as appropriate.    Grant Fontana, [place] 03/09/11 1700

## 2011-03-10 ENCOUNTER — Inpatient Hospital Stay (HOSPITAL_COMMUNITY): Payer: Medicare Other

## 2011-03-10 DIAGNOSIS — K56609 Unspecified intestinal obstruction, unspecified as to partial versus complete obstruction: Secondary | ICD-10-CM | POA: Diagnosis present

## 2011-03-10 DIAGNOSIS — E860 Dehydration: Secondary | ICD-10-CM

## 2011-03-10 LAB — GLUCOSE, CAPILLARY
Glucose-Capillary: 181 mg/dL — ABNORMAL HIGH (ref 70–99)
Glucose-Capillary: 166 mg/dL — ABNORMAL HIGH (ref 70–99)

## 2011-03-10 LAB — CBC
MCH: 29.6 pg (ref 26.0–34.0)
MCV: 90.6 fL (ref 78.0–100.0)
Platelets: 202 10*3/uL (ref 150–400)
RDW: 15.4 % (ref 11.5–15.5)
WBC: 13.5 10*3/uL — ABNORMAL HIGH (ref 4.0–10.5)

## 2011-03-10 LAB — URINALYSIS, ROUTINE W REFLEX MICROSCOPIC
Glucose, UA: 100 mg/dL — AB
Leukocytes, UA: NEGATIVE
Protein, ur: >300 mg/dL — AB
Specific Gravity, Urine: >1.046 — ABNORMAL HIGH (ref 1.005–1.030)
Urobilinogen, UA: 0.2 mg/dL (ref 0.0–1.0)

## 2011-03-10 LAB — CREATININE, SERUM
Creatinine, Ser: 0.91 mg/dL (ref 0.50–1.35)
GFR calc non Af Amer: >90 mL/min (ref >90)

## 2011-03-10 LAB — URINE MICROSCOPIC-ADD ON

## 2011-03-10 LAB — BASIC METABOLIC PANEL
Calcium: 10.6 mg/dL — ABNORMAL HIGH (ref 8.4–10.5)
Creatinine, Ser: 0.97 mg/dL (ref 0.50–1.35)
GFR calc Af Amer: >90 mL/min (ref >90)
Sodium: 136 mEq/L (ref 135–145)

## 2011-03-10 MED ORDER — BISACODYL 10 MG RE SUPP
10.0000 mg | Freq: Once | RECTAL | Status: AC
  Administered 2011-03-10: 10 mg via RECTAL
  Filled 2011-03-10: qty 1

## 2011-03-10 MED ORDER — ALBUTEROL SULFATE (5 MG/ML) 0.5% IN NEBU
INHALATION_SOLUTION | RESPIRATORY_TRACT | Status: AC
  Administered 2011-03-10: 2.5 mg
  Filled 2011-03-10: qty 0.5

## 2011-03-10 MED ORDER — INSULIN ASPART 100 UNIT/ML ~~LOC~~ SOLN
0.0000 [IU] | SUBCUTANEOUS | Status: DC
  Administered 2011-03-10 – 2011-03-11 (×4): 3 [IU] via SUBCUTANEOUS
  Administered 2011-03-11: 2 [IU] via SUBCUTANEOUS
  Administered 2011-03-11: 3 [IU] via SUBCUTANEOUS
  Administered 2011-03-11 (×2): 2 [IU] via SUBCUTANEOUS
  Administered 2011-03-12: 5 [IU] via SUBCUTANEOUS
  Administered 2011-03-12: 3 [IU] via SUBCUTANEOUS
  Administered 2011-03-12 (×3): 2 [IU] via SUBCUTANEOUS
  Administered 2011-03-13: 3 [IU] via SUBCUTANEOUS
  Administered 2011-03-13: 5 [IU] via SUBCUTANEOUS
  Administered 2011-03-13: 2 [IU] via SUBCUTANEOUS
  Administered 2011-03-13: 5 [IU] via SUBCUTANEOUS
  Administered 2011-03-13: 2 [IU] via SUBCUTANEOUS
  Administered 2011-03-13: 3 [IU] via SUBCUTANEOUS
  Administered 2011-03-14: 5 [IU] via SUBCUTANEOUS
  Administered 2011-03-14 (×2): 3 [IU] via SUBCUTANEOUS
  Administered 2011-03-14: 5 [IU] via SUBCUTANEOUS

## 2011-03-10 MED ORDER — PHENOL 1.4 % MT LIQD
1.0000 | OROMUCOSAL | Status: DC | PRN
  Administered 2011-03-10: 1 via OROMUCOSAL
  Filled 2011-03-10: qty 177

## 2011-03-10 NOTE — Progress Notes (Signed)
Inpatient Diabetes Program Recommendations  AACE/ADA: New Consensus Statement on Inpatient Glycemic Control (2009)  Target Ranges:  Prepandial:   less than 140 mg/dL      Peak postprandial:   less than 180 mg/dL (1-2 hours)      Critically ill patients:  140 - 180 mg/dL   Home DM medications: Lantus 80 units daily in AM Novolog 6-25 units tid with meals (per SSI)  Inpatient Diabetes Program Recommendations Correction (SSI): Please change Novolog Moderate SSI to Q4 hours while pt NPO (currently ordered tid with meals) HgbA1C: Please check A1C to assess home glucose control.  Note:

## 2011-03-10 NOTE — Progress Notes (Signed)
Ng output clear, some flatus after suppository, abdomen nontender, hopefully will resolve conservatively

## 2011-03-10 NOTE — H&P (Signed)
Family Medicine Teaching Service Attending Note  I interviewed and examined patient Tyrone Cunningham and reviewed their tests and x-rays.  I discussed with Dr. Madolyn Frieze and reviewed their note for today.  I agree with their assessment and plan.     Additionally  Currently no acute distress with mild diffuse abdomen pain.  His belly seems about the same size to him as usual Appreciate surgery consult Continue NPO and limit narcotics as much as possible.  Ambulate. Given degree of NG drainage and his body habitus would increase IVF to 125 cc at least Hopefully will resolve without surgery

## 2011-03-10 NOTE — Progress Notes (Signed)
Family MedicineTeaching Service Daily Resident Note  Patient name: Tyrone Cunningham Medical record number: [id number] Date of birth: Feb 20, 1956 Age: 56 y.o. Gender: male Length of Stay:  LOS: 1 day   Subjective: Patient reportedly feels much better today after bowel decompression with NG tube.  He is concerned about his home medications and would like as many as possible converted IVs that he can maintain his current regimen. He voices no new concerns at this time and is understanding treatment plan  Objective: Vitals: Patient Vitals for the past 24 hrs:  BP Temp Temp src Pulse Resp SpO2 Height Weight  03/10/11 1330 106/42 mmHg 98.6 F (37 C) - 98  20  94 % - -  03/10/11 1239 - - - - - 94 % - -  03/10/11 0925 - - - 105  20  - - -  03/10/11 0924 - - - - - 93 % - -  03/10/11 0500 137/76 mmHg 98.4 F (36.9 C) - 97  18  98 % - -  03/10/11 0300 - - - 97  18  99 % - -  03/09/11 2357 - - - 102  18  93 % - -  03/09/11 2135 131/69 mmHg 99.1 F (37.3 C) Oral 106  20  99 % 5\' 7"  (1.702 m) 360 lb 4.8 oz (163.431 kg)  03/09/11 2100 108/64 mmHg - - 105  - 98 % - -  03/09/11 1902 142/82 mmHg - - 105  - 96 % - -  03/09/11 1759 177/91 mmHg - - 101  - 97 % - -   Wt Readings from Last 3 Encounters:  03/09/11 360 lb 4.8 oz (163.431 kg)    Intake/Output Summary (Last 24 hours) at 03/10/11 1520 Last data filed at 03/10/11 1500  Gross per 24 hour  Intake   1275 ml  Output   5000 ml  Net  -3725 ml   PE: GENERAL:   Adult Caucasian obese male. Examined in [facility] 5100, in no acute distress, nasogastric tube is in place, trach collar in place. Able to speak with manual occlusion of tracheostomy site. HNEENT: Atraumatic, normocephalic, trach collar in place, broad-based neck, THORAX: HEART: Distant heart sounds, regular rate appreciated, no murmur appreciated LUNGS: Decreased breath sounds, clear to auscultation ABDOMEN:  Morbidly obese, positive however hypoactive bowel sounds, minimally tender over the  central abdomen, EXTREMITIES: 2+ out of 4 bilateral lower extremity edema, mild venous stasis changes >SKIN: Multiple small open wounds on upper and lower extremities, no overt signs of cellulitis or overwhelming infection Labs: Hematology:  Lab 03/10/11 0535 03/09/11 1411  WBC 13.5* 13.7*  HGB 15.7 16.1  HCT 48.1 47.1  PLT 202 197  RDW 15.4 14.9  MCV 90.6 87.9  MCHC 32.6 34.2   Metabolic:  Lab 03/10/11 0535 03/10/11 0036 03/09/11 1411  NA 136 -- 135  K 4.5 -- 4.3  CL 92* -- 91*  CO2 33* -- 33*  BUN 31* -- 28*  CREATININE 0.97 0.91 0.89  GLUCOSE 145* -- 241*  CALCIUM 10.6* -- 10.9*   Diabetes Management:  Lab 03/10/11 1159 03/10/11 0751  GLUCAP 193* 181*   Micro: NO micro  Imaging: KUB 1/7: Findings: Decompression of the stomach with nasogastric tube. No  plain film evidence of bowel obstruction. CT demonstrated proximal  small bowel obstructive pattern with loops predominately fluid and  content filled and therefore may not be adequately assessed by  plain film exam.  Upright view is motion degraded limiting detection  of small amount  of free intraperitoneal air. No gross free intraperitoneal air  noted.  IMPRESSION:  Decompressed stomach with nasogastric tube placed. Please see  above.  Medications:    . albuterol      . albuterol      . bisacodyl  10 mg Rectal Once  . Fluticasone-Salmeterol  1 puff Inhalation Q12H  . heparin  5,000 Units Subcutaneous Q8H  .  HYDROmorphone (DILAUDID) injection  1 mg Intravenous Once  .  HYDROmorphone (DILAUDID) injection  1 mg Intravenous Once  . insulin aspart  0-15 Units Subcutaneous Q4H  . ondansetron (ZOFRAN) IV  4 mg Intravenous Once  . pantoprazole (PROTONIX) IV  40 mg Intravenous Q24H  . pneumococcal 23 valent vaccine  0.5 mL Intramuscular Tomorrow-1000  . sodium chloride  500 mL Intravenous STAT  . DISCONTD: sodium chloride   Intravenous Once  . DISCONTD: sodium chloride   Intravenous Once  . DISCONTD:  furosemide  40 mg Oral Daily  . DISCONTD: gabapentin  300 mg Oral TID  . DISCONTD: insulin aspart  0-15 Units Subcutaneous TID WC  . DISCONTD: lisinopril  5 mg Oral Daily   Continuous Infusions:    . sodium chloride 75 mL/hr at 03/10/11 1500   PRN Meds:.albuterol, HYDROmorphone, iohexol, iohexol, ondansetron (ZOFRAN) IV, ondansetron, DISCONTD: HYDROcodone-acetaminophen  Assessment & Plan: 56 year old Caucasian male with morbid obesity who is status post appendectomy now presenting with a small bowel obstruction.  1. Small bowel obstruction:  Patient is currently n.p.o. with NG tube placed. Surgery following with no current plans for surgical intervention. Currently has home medications written by mouth, consideration of transitioning to IV meds acutely.  *Dilaudid IV 2. Respiratory (severe sleep apnea status post tracheostomy): Patient does not use CPAP at home, on home albuterol &Advair will continue home regimen at this time  *Albuterol  *Advair  3. Cardiovascular (CHF, hypertension):  Unknown EF at this time (unable to calculate on last ECHO due to body habitus) however no clinical signs or symptoms of acute CHF exacerbation. We will monitor and if needed control his hypertension with IV antihypertensives until he is able to tolerate by mouth. We will be moderate with fluid resuscitation due to his CHF, however due to his ongoing losses he will likely need continuous IV fluids.  Holding lasix and lisinopril at this time.   4. Endocrine (DM, HLD) - hold statin until POing *SSI  FEN/GI: NPO IVFs: NS @ 37ml/hr PPX: Heparin SQ & Protonix IV 40mg  qd  ======> DISPOSITION:  Continue with n.p.o. and NG tube. Surgery continuing to follow will defer to them for surgical plan. Will convert to by mouth at the recommendation, well-controlled hypertensive and chronic ongoing medical issues with IV medications at this time.  Gaspar Bidding, DO Family Medicine Resident PGY-1 03/10/2011 3:20  PM

## 2011-03-10 NOTE — Progress Notes (Signed)
I interviewed and examined this patient and discussed the care plan with Dr.Rigby and the FPTS team and agree with assessment and plan as documented in the progress note for today.    Tyrone Cunningham A. Sheffield Slider, MD Family Medicine Teaching Service Attending  03/10/2011 7:58 PM

## 2011-03-10 NOTE — Progress Notes (Signed)
Subjective: Pt ok. Feels better with NG in. Good amount of bilious output in canister. Denies flatus but has felt some 'grumblings' Denies much pain  Objective: Vital signs in last 24 hours: Temp:  [98.4 F (36.9 C)-99.1 F (37.3 C)] 98.4 F (36.9 C) (01/07 0500) Pulse Rate:  [87-106] 97  (01/07 0500) Resp:  [18-20] 18  (01/07 0500) BP: (108-177)/(64-91) 137/76 mmHg (01/07 0500) SpO2:  [93 %-100 %] 98 % (01/07 0500) FiO2 (%):  [28 %] 28 % (01/07 0500) Weight:  [163.431 kg (360 lb 4.8 oz)] 360 lb 4.8 oz (163.431 kg) (01/06 2135) Last BM Date: 03/06/11  Intake/Output this shift:    Physical Exam: BP 137/76  Pulse 97  Temp(Src) 98.4 F (36.9 C) (Oral)  Resp 18  Ht 5\' 7"  (1.702 m)  Wt 163.431 kg (360 lb 4.8 oz)  BMI 56.43 kg/m2  SpO2 98% Abdomen: protuberant but soft. Few BS NT  Labs: CBC  Basename 03/10/11 0535 03/09/11 1411  WBC 13.5* 13.7*  HGB 15.7 16.1  HCT 48.1 47.1  PLT 202 197   BMET  Basename 03/10/11 0535 03/10/11 0036 03/09/11 1411  NA 136 -- 135  K 4.5 -- 4.3  CL 92* -- 91*  CO2 33* -- 33*  GLUCOSE 145* -- 241*  BUN 31* -- 28*  CREATININE 0.97 0.91 --  CALCIUM 10.6* -- 10.9*   LFT  Basename 03/09/11 1411  PROT 8.7*  ALBUMIN 3.8  AST 17  ALT 23  ALKPHOS 72  BILITOT 0.4  BILIDIR --  IBILI --  LIPASE --   PT/INR No results found for this basename: LABPROT:2,INR:2 in the last 72 hours ABG No results found for this basename: PHART:2,PCO2:2,PO2:2,HCO3:2 in the last 72 hours  Studies/Results: Ct Abdomen Pelvis W Contrast  03/09/2011  *RADIOLOGY REPORT*  Clinical Data: Abdominal distention.  CT ABDOMEN AND PELVIS WITH CONTRAST  Technique:  Multidetector CT imaging of the abdomen and pelvis was performed following the standard protocol during bolus administration of intravenous contrast.  Contrast: OMNIPAQUE IOHEXOL 300 MG/ML IV SOLN  Comparison: Abdominal radiographs 03/09/2011.  Findings: The lung bases are clear.  Morbid obesity.   There is diffuse fatty infiltration of the liver but no focal hepatic lesions or intrahepatic biliary dilatation.  Gallbladder is distended.  No common bile duct dilatation.  The pancreas is normal.  The spleen is normal in size.  No focal lesions.  The adrenal glands and kidneys are unremarkable.  There is marked distention of the stomach, duodenum and proximal small bowel.  There is a transition to normal/decompressed small bowel in the mid abdomen.  This is likely due to adhesions.  No masses seen.  The colon is grossly normal.  No mesenteric or retroperitoneal masses or adenopathy.  There are scattered lymph nodes.  The aorta is normal in caliber.  Moderate atherosclerotic changes, advanced for age.  No retroperitoneal process.  The bladder is distended.  The prostate gland and seminal vesicles are unremarkable.  No pelvic mass, adenopathy or free pelvic fluid collections.  No inguinal mass or hernia.  The bony pelvis is intact.  IMPRESSION:  1.  Morbid obesity. 2.  Proximal small bowel obstruction likely due to adhesions in the mid abdomen. 3.  Diffuse fatty infiltration of the liver and gallbladder distention. 4.  Mild distention of the bladder.  Original Report Authenticated By: P. Loralie Champagne, M.D.   Dg Abd 2 Views  03/10/2011  *RADIOLOGY REPORT*  Clinical Data: Followup small bowel obstruction.  ABDOMEN -  2 VIEW  Comparison: 03/09/2011  Findings: Decompression of the stomach with nasogastric tube.  No plain film evidence of bowel obstruction.  CT demonstrated proximal small bowel obstructive pattern with loops predominately fluid and content filled and therefore may not be adequately assessed by plain film exam.  Upright view is motion degraded limiting detection of small amount of free intraperitoneal air.  No gross free intraperitoneal air noted.  IMPRESSION: Decompressed stomach with nasogastric tube placed.  Please see above.  Original Report Authenticated By: Fuller Canada, M.D.   Dg Abd Acute  W/chest  03/09/2011  *RADIOLOGY REPORT*  Clinical Data: Abdominal distention.  Mid abdominal pain.  ACUTE ABDOMEN SERIES (ABDOMEN 2 VIEW & CHEST 1 VIEW)  Comparison: Chest dated 03/21/2009.  Findings: Enlarged cardiac silhouette with a mild decrease in size. Diffusely prominent interstitial markings with mild improvement. Tracheostomy tube in satisfactory position.  Stomach distended with gas and fluid.  No bowel dilatation or free peritoneal air. Prominent stool in the colon, especially on the right.  Hardware fixation of the left hip.  IMPRESSION:  1.  Cardiomegaly, chronic bronchitic changes and chronic interstitial lung disease. 2.  Gastric distention.  Original Report Authenticated By: Darrol Angel, M.D.    Assessment: Active Problems:  Small bowel obstruction  Plan: X-rays look better, also looks like some retained colonic stool. Will try suppository, then enema. If we get some bowel function, we can possibly clamp, remove NG tomorrow. Discussed plan of care with pt including possibility of surgery if no improvement.  LOS: 1 day    Marianna Fuss 03/10/2011

## 2011-03-11 ENCOUNTER — Inpatient Hospital Stay (HOSPITAL_COMMUNITY): Payer: Medicare Other

## 2011-03-11 LAB — GLUCOSE, CAPILLARY: Glucose-Capillary: 167 mg/dL — ABNORMAL HIGH (ref 70–99)

## 2011-03-11 MED ORDER — ALBUTEROL SULFATE (5 MG/ML) 0.5% IN NEBU
INHALATION_SOLUTION | RESPIRATORY_TRACT | Status: AC
  Administered 2011-03-11: 2.5 mg via RESPIRATORY_TRACT
  Filled 2011-03-11: qty 0.5

## 2011-03-11 MED ORDER — LORAZEPAM 2 MG/ML IJ SOLN
INTRAMUSCULAR | Status: AC
  Administered 2011-03-11: 1 mg via INTRAVENOUS
  Filled 2011-03-11: qty 1

## 2011-03-11 MED ORDER — LORAZEPAM 2 MG/ML IJ SOLN
1.0000 mg | Freq: Four times a day (QID) | INTRAMUSCULAR | Status: DC | PRN
  Administered 2011-03-11 – 2011-03-12 (×3): 1 mg via INTRAVENOUS
  Filled 2011-03-11 (×2): qty 1

## 2011-03-11 MED ORDER — TIOTROPIUM BROMIDE MONOHYDRATE 18 MCG IN CAPS
18.0000 ug | ORAL_CAPSULE | Freq: Every day | RESPIRATORY_TRACT | Status: DC
  Administered 2011-03-11 – 2011-03-14 (×4): 18 ug via RESPIRATORY_TRACT
  Filled 2011-03-11: qty 5

## 2011-03-11 MED ORDER — ALBUTEROL SULFATE (5 MG/ML) 0.5% IN NEBU
2.5000 mg | INHALATION_SOLUTION | RESPIRATORY_TRACT | Status: DC | PRN
Start: 2011-03-11 — End: 2011-03-15
  Administered 2011-03-11: 2.5 mg via RESPIRATORY_TRACT

## 2011-03-11 MED ORDER — ALBUTEROL SULFATE (5 MG/ML) 0.5% IN NEBU
2.5000 mg | INHALATION_SOLUTION | Freq: Four times a day (QID) | RESPIRATORY_TRACT | Status: DC
  Administered 2011-03-11 – 2011-03-15 (×18): 2.5 mg via RESPIRATORY_TRACT
  Filled 2011-03-11 (×18): qty 0.5

## 2011-03-11 NOTE — Progress Notes (Signed)
Subjective: Denies abdominal pain or nausea/vomiting. Patient received 5 doses of Dilaudid over the past 24 hours; last dose at 01:00 today. He has not been requiring any Zofran.  NGT is hurting is throat and would really like it removed.  Received an enema and had a bowel movement yesterday.   Objective: Vital signs in last 24 hours: Temp:  [98.4 F (36.9 C)-98.7 F (37.1 C)] 98.7 F (37.1 C) (01/08 0453) Pulse Rate:  [96-110] 98  (01/08 0501) Resp:  [16-20] 16  (01/08 0501) BP: (106-134)/(42-78) 121/65 mmHg (01/08 0453) SpO2:  [93 %-100 %] 96 % (01/08 0501) FiO2 (%):  [2 %-28 %] 28 % (01/08 0501) Last BM Date: 03/10/11  Intake/Output from previous day: 01/07 0701 - 01/08 0700 In: 1657 [I.V.:1657] Out: 2300 [Urine:650; Emesis/NG output:1650] Intake/Output this shift:   Physical exam: Gen: NAD, morbidly obese Psych: cantankerous, irritated Abd: obese, NABS, soft, NT, decreased distension  Lab Results:   Basename 03/10/11 0535 03/09/11 1411  WBC 13.5* 13.7*  HGB 15.7 16.1  HCT 48.1 47.1  PLT 202 197   BMET  Basename 03/10/11 0535 03/10/11 0036 03/09/11 1411  NA 136 -- 135  K 4.5 -- 4.3  CL 92* -- 91*  CO2 33* -- 33*  GLUCOSE 145* -- 241*  BUN 31* -- 28*  CREATININE 0.97 0.91 --  CALCIUM 10.6* -- 10.9*   PT/INR No results found for this basename: LABPROT:2,INR:2 in the last 72 hours ABG No results found for this basename: PHART:2,PCO2:2,PO2:2,HCO3:2 in the last 72 hours  Studies/Results: Ct Abdomen Pelvis W Contrast  03/09/2011  *RADIOLOGY REPORT*  Clinical Data: Abdominal distention.  CT ABDOMEN AND PELVIS WITH CONTRAST  Technique:  Multidetector CT imaging of the abdomen and pelvis was performed following the standard protocol during bolus administration of intravenous contrast.  Contrast: OMNIPAQUE IOHEXOL 300 MG/ML IV SOLN  Comparison: Abdominal radiographs 03/09/2011.  Findings: The lung bases are clear.  Morbid obesity.  There is diffuse fatty  infiltration of the liver but no focal hepatic lesions or intrahepatic biliary dilatation.  Gallbladder is distended.  No common bile duct dilatation.  The pancreas is normal.  The spleen is normal in size.  No focal lesions.  The adrenal glands and kidneys are unremarkable.  There is marked distention of the stomach, duodenum and proximal small bowel.  There is a transition to normal/decompressed small bowel in the mid abdomen.  This is likely due to adhesions.  No masses seen.  The colon is grossly normal.  No mesenteric or retroperitoneal masses or adenopathy.  There are scattered lymph nodes.  The aorta is normal in caliber.  Moderate atherosclerotic changes, advanced for age.  No retroperitoneal process.  The bladder is distended.  The prostate gland and seminal vesicles are unremarkable.  No pelvic mass, adenopathy or free pelvic fluid collections.  No inguinal mass or hernia.  The bony pelvis is intact.  IMPRESSION:  1.  Morbid obesity. 2.  Proximal small bowel obstruction likely due to adhesions in the mid abdomen. 3.  Diffuse fatty infiltration of the liver and gallbladder distention. 4.  Mild distention of the bladder.  Original Report Authenticated By: P. Loralie Champagne, M.D.   Dg Abd 2 Views  03/11/2011  *RADIOLOGY REPORT*  Clinical Data: Small bowel obstruction  ABDOMEN - 2 VIEW  Comparison: 03/10/2011  Findings: Stable NG tube position.  Study is limited by patient's large body habitus.  There is nonspecific nonobstructive bowel gas pattern.  Moderate stool seen throughout the  colon.  Postsurgical changes proximal left femur.  No free abdominal air.  IMPRESSION: NG tube in place.  Nonspecific nonobstructive bowel gas pattern. Moderate colonic stool. No free abdominal air.  Original Report Authenticated By: Natasha Mead, M.D.   Dg Abd 2 Views  03/10/2011  *RADIOLOGY REPORT*  Clinical Data: Followup small bowel obstruction.  ABDOMEN - 2 VIEW  Comparison: 03/09/2011  Findings: Decompression of the  stomach with nasogastric tube.  No plain film evidence of bowel obstruction.  CT demonstrated proximal small bowel obstructive pattern with loops predominately fluid and content filled and therefore may not be adequately assessed by plain film exam.  Upright view is motion degraded limiting detection of small amount of free intraperitoneal air.  No gross free intraperitoneal air noted.  IMPRESSION: Decompressed stomach with nasogastric tube placed.  Please see above.  Original Report Authenticated By: Fuller Canada, M.D.   Dg Abd Acute W/chest  03/09/2011  *RADIOLOGY REPORT*  Clinical Data: Abdominal distention.  Mid abdominal pain.  ACUTE ABDOMEN SERIES (ABDOMEN 2 VIEW & CHEST 1 VIEW)  Comparison: Chest dated 03/21/2009.  Findings: Enlarged cardiac silhouette with a mild decrease in size. Diffusely prominent interstitial markings with mild improvement. Tracheostomy tube in satisfactory position.  Stomach distended with gas and fluid.  No bowel dilatation or free peritoneal air. Prominent stool in the colon, especially on the right.  Hardware fixation of the left hip.  IMPRESSION:  1.  Cardiomegaly, chronic bronchitic changes and chronic interstitial lung disease. 2.  Gastric distention.  Original Report Authenticated By: Darrol Angel, M.D.    Assessment/Plan: Small bowel obstruction. Patient doing well with resolution of abdominal pain and nausea. Constipation resolved s/p enema yesterday.   -Will clamp NGT now and if he tolerates this, will remove later today.   -If NGT removed this afternoon, will start clears and Miralax this evening.     LOS: 2 days    OH PARK, ANGELA 03/11/2011

## 2011-03-11 NOTE — Progress Notes (Signed)
Family Medicine Teaching Service Daily Resident Note   Patient name: Tyrone Cunningham Medical record number: [id number] Date of birth: 1955/04/15 Age: 56 y.o. Gender: male Length of Stay:  LOS: 2 days             SUBJECTIVE  Patient really frustrated however his discomfort is much improved. He did required a lot of overnight and states it is not helping very much but he is not wanting to continue to use it. Discussed the plan of care which he was agreeable with the end of the discussion however was upset and initially with the situation. No new concerns at this time and voices understanding of care plan.            OBJECTIVE  Vitals: Patient Vitals for the past 24 hrs:  BP Temp Temp src Pulse Resp SpO2  03/11/11 0501 - - - 98  16  96 %  03/11/11 0453 121/65 mmHg 98.7 F (37.1 C) Oral 98  18  96 %  03/11/11 0119 - - - - - 96 %  03/11/11 0015 - - - 100  18  95 %  03/11/11 0012 - - - - - 95 %  03/10/11 2332 - - - - - 95 %  03/10/11 2120 134/78 mmHg 98.4 F (36.9 C) Oral 110  18  95 %  03/10/11 1921 - - - 105  18  100 %  03/10/11 1525 - - - 96  20  95 %  03/10/11 1330 106/42 mmHg 98.6 F (37 C) - 98  20  94 %  03/10/11 1239 - - - - - 94 %  03/10/11 0925 - - - 105  20  -  03/10/11 0924 - - - - - 93 %   Wt Readings from Last 3 Encounters:  03/09/11 360 lb 4.8 oz (163.431 kg)    Intake/Output Summary (Last 24 hours) at 03/11/11 0844 Last data filed at 03/11/11 0700  Gross per 24 hour  Intake 1657.03 ml  Output   2300 ml  Net -642.97 ml    PE: GENERAL: Adult Caucasian obese male. Examined in [facility] 5100, in no acute distress, nasogastric tube is in place, trach collar in place. Able to speak with manual occlusion of tracheostomy site.  HNEENT: Atraumatic, normocephalic, trach collar in place, broad-based neck,  THORAX:  HEART: Distant heart sounds, regular rate appreciated, no murmur appreciated  LUNGS: Decreased breath sounds, clear to auscultation  ABDOMEN: Morbidly obese, positive  however hypoactive bowel sounds, minimally tender over the central abdomen,  EXTREMITIES: 2+ out of 4 bilateral lower extremity edema, mild venous stasis changes  >SKIN: Multiple small open wounds on upper and lower extremities, no overt signs of cellulitis or overwhelming infection  LABS: Hematology:  Lab 03/10/11 0535 03/09/11 1411  WBC 13.5* 13.7*  HGB 15.7 16.1  HCT 48.1 47.1  PLT 202 197  RDW 15.4 14.9  MCV 90.6 87.9  MCHC 32.6 34.2   Metabolic:  Lab 03/10/11 0535 03/10/11 0036 03/09/11 1411  NA 136 -- 135  K 4.5 -- 4.3  CL 92* -- 91*  CO2 33* -- 33*  BUN 31* -- 28*  CREATININE 0.97 0.91 0.89  GLUCOSE 145* -- 241*  CALCIUM 10.6* -- 10.9*  MG -- -- --  PHOS -- -- --    Lab 03/09/11 1411  ALKPHOS 72  BILITOT 0.4  BILIDIR --  PROT 8.7*  ALT 23  AST 17   Diabetes Management:  Lab 03/11/11 0746  03/11/11 0449 03/10/11 2325  GLUCAP 167* 139* 166*   MICRO None  IMAGING: 2View ABD 03/11/11:  NG tube in place. Nonspecific nonobstructive bowel gas pattern.  Moderate colonic stool.  No free abdominal air.            ASSESSMENT & PLAN  56 year old Caucasian male with morbid obesity who is status post appendectomy now presenting with a small bowel obstruction.   1. Small bowel obstruction:  Plan is to clamp NG tube today we'll and measure residual later. Plan to discontinue NG tube later today, and start on clear liquids. Will advance diet as tolerated over the next 2-3 days; continue to defer to surgery for advancement; no current plans for surgical intervention. Home po meds held continue to monitor.   *Dilaudid IV  2. Respiratory (severe sleep apnea status post tracheostomy): Patient does not use CPAP at home, on home albuterol &Advair will continue home regimen at this time  *Albuterol  *Advair *Spiriva  3. Cardiovascular (CHF, hypertension):  Unknown EF at this time (unable to calculate on last ECHO due to body habitus) however no clinical signs or symptoms  of acute CHF exacerbation. We will monitor and if needed control his hypertension with IV antihypertensives until he is able to tolerate by mouth. We will be moderate with fluid resuscitation due to his CHF, however due to his ongoing losses he will likely need continuous IV fluids.  Holding lasix and lisinopril at this time will continue to monitor and make changes as appropriate.  Would benefit from beta blocker as OP.  Will hold off at this time  4. Endocrine (DM, HLD) - hold statin until POing *SSI  --- FEN/GI: NPO, plan to advance after NG out.   --- IVFs: NS @ 33ml/hr; will plan on KVO with toleration of po fluids  --- PPx: Heparin SQ & Protonix IV 40mg  qd            DISPOSITION  Continue with n.p.o. and NG tube. Surgery continuing to follow will defer to them for surgical plan. Will convert to by mouth at their recommendation.  We will continue to monitor and control HTN and chronic ongoing medical issues with IV medications at this time as needed.   Gaspar Bidding, DO [facility] Resident - PGY-1 03/11/2011 8:44 AM

## 2011-03-11 NOTE — Progress Notes (Signed)
Remove ngt tube today

## 2011-03-11 NOTE — Progress Notes (Signed)
I interviewed and examined this patient and discussed the care plan with Dr. Berline Chough and the [facility] team and agree with assessment and plan as documented in the progress note for today. He has the NG tube out and is taking sips and chips without producing nausea. His abdomen is very quiet vs BS muffled by adipose tissue. No further BM's today per the patient.    Jamiah Homeyer A. Sheffield Slider, MD Family Medicine Teaching Service Attending  03/11/2011 5:36 PM

## 2011-03-12 LAB — GLUCOSE, CAPILLARY
Glucose-Capillary: 121 mg/dL — ABNORMAL HIGH (ref 70–99)
Glucose-Capillary: 122 mg/dL — ABNORMAL HIGH (ref 70–99)
Glucose-Capillary: 124 mg/dL — ABNORMAL HIGH (ref 70–99)
Glucose-Capillary: 190 mg/dL — ABNORMAL HIGH (ref 70–99)

## 2011-03-12 LAB — CBC
MCV: 91.4 fL (ref 78.0–100.0)
Platelets: 177 10*3/uL (ref 150–400)
RBC: 4.86 MIL/uL (ref 4.22–5.81)
WBC: 9.5 10*3/uL (ref 4.0–10.5)

## 2011-03-12 LAB — BASIC METABOLIC PANEL
CO2: 32 mEq/L (ref 19–32)
Calcium: 9.1 mg/dL (ref 8.4–10.5)
Chloride: 97 mEq/L (ref 96–112)
GFR calc Af Amer: >90 mL/min (ref >90)
Sodium: 140 mEq/L (ref 135–145)

## 2011-03-12 MED ORDER — PANTOPRAZOLE SODIUM 40 MG PO TBEC
40.0000 mg | DELAYED_RELEASE_TABLET | Freq: Every day | ORAL | Status: DC
  Administered 2011-03-12 – 2011-03-14 (×3): 40 mg via ORAL
  Filled 2011-03-12 (×3): qty 1

## 2011-03-12 MED ORDER — TRAZODONE HCL 50 MG PO TABS
50.0000 mg | ORAL_TABLET | Freq: Three times a day (TID) | ORAL | Status: DC | PRN
  Administered 2011-03-13 – 2011-03-15 (×3): 50 mg via ORAL
  Filled 2011-03-12 (×3): qty 1

## 2011-03-12 MED ORDER — GABAPENTIN 300 MG PO CAPS
300.0000 mg | ORAL_CAPSULE | Freq: Three times a day (TID) | ORAL | Status: DC
  Administered 2011-03-12 – 2011-03-15 (×9): 300 mg via ORAL
  Filled 2011-03-12 (×11): qty 1

## 2011-03-12 MED ORDER — HYDROCODONE-ACETAMINOPHEN 5-325 MG PO TABS
1.0000 | ORAL_TABLET | Freq: Four times a day (QID) | ORAL | Status: DC | PRN
  Administered 2011-03-12 – 2011-03-13 (×3): 1 via ORAL
  Filled 2011-03-12 (×3): qty 1

## 2011-03-12 MED ORDER — POLYETHYLENE GLYCOL 3350 17 G PO PACK
17.0000 g | PACK | Freq: Two times a day (BID) | ORAL | Status: DC
  Administered 2011-03-12 – 2011-03-15 (×7): 17 g via ORAL
  Filled 2011-03-12 (×8): qty 1

## 2011-03-12 NOTE — Progress Notes (Signed)
Agree with above, he is passing flatus and can advance diet tomorrow as tolerated.

## 2011-03-12 NOTE — Progress Notes (Signed)
  Subjective: No new complaints.  Abdominal pain is minimal. He received several doses of prn Dilaudid in the past 24 hours, but he says this is more for his chronic pain than his abdominal pain. He currently denies any abdominal pain at rest, however, he reports some abdominal pain with movement.  No bowel movements yesterday.  He reports only taking ice chips since yesterday after his NGT was removed. He denies nausea.   Objective: Vital signs in last 24 hours: Temp:  [98.5 F (36.9 C)] 98.5 F (36.9 C) (01/09 0610) Pulse Rate:  [93-103] 95  (01/09 0610) Resp:  [16-20] 18  (01/09 0610) BP: (112-134)/(54-66) 134/66 mmHg (01/09 0610) SpO2:  [93 %-100 %] 95 % (01/09 0610) FiO2 (%):  [28 %] 28 % (01/09 0610) Last BM Date: 03/10/11  Intake/Output from previous day: 04/07/22 0701 - 01/09 0700 In: 1600 [I.V.:1600] Out: 575 [Urine:575] Intake/Output this shift:    Physical exam: Gen: NAD, sitting up in bed, morbidly obese Psych: irritable Abd: obese, soft, NT, some distension    Lab Results:   [facility] 03/12/11 0650 03/10/11 0535  WBC 9.5 13.5*  HGB 14.1 15.7  HCT 44.4 48.1  PLT 177 202   BMET  Basename 03/10/11 0535 03/10/11 0036 03/09/11 1411  NA 136 -- 135  K 4.5 -- 4.3  CL 92* -- 91*  CO2 33* -- 33*  GLUCOSE 145* -- 241*  BUN 31* -- 28*  CREATININE 0.97 0.91 --  CALCIUM 10.6* -- 10.9*   PT/INR No results found for this basename: LABPROT:2,INR:2 in the last 72 hours ABG No results found for this basename: PHART:2,PCO2:2,PO2:2,HCO3:2 in the last 72 hours  Studies/Results: Dg Abd 2 Views  04/08/11  *RADIOLOGY REPORT*  Clinical Data: Small bowel obstruction  ABDOMEN - 2 VIEW  Comparison: 03/10/2011  Findings: Stable NG tube position.  Study is limited by patient's large body habitus.  There is nonspecific nonobstructive bowel gas pattern.  Moderate stool seen throughout the colon.  Postsurgical changes proximal left femur.  No free abdominal air.  IMPRESSION: NG  tube in place.  Nonspecific nonobstructive bowel gas pattern. Moderate colonic stool. No free abdominal air.  Original Report Authenticated By: Natasha Mead, M.D.    Anti-infectives: Anti-infectives    None      Assessment/Plan: Small bowel obstruction. Patient continues to do well after NGT was removed yesterday afternoon.  -Clear liquid diet, advance as tolerated.  -Consider transitioning patient back to his home chronic pain medication regimen. He had been placed on Dilaudid prn during this hospitalization for his acute pain, which is significantly improved.  -Will order Miralax scheduled for constipation PPx. Last bowel movement was on Monday after receiving an enema.    LOS: 3 days    OH PARK, ANGELA 03/12/2011

## 2011-03-12 NOTE — Progress Notes (Signed)
Patient has expressed his desire to have medications to "put him to sleep" . Advised him we are not able to provide sleep medication during the day shift. Pt has complained that he would like to eat solid food, explained to him that his bowel obst was still present and per md he needs to have a bm. Started pt on mirilax at 1154. Pt requested another dose of mirilax at 1530. He said he wants to speed things up. Explained his mirilax is due again tonight. Pt states he wants to walk to the bathroom and probably will not use his call bell for assistance. Encouraged pt to please notify staff when he is ready to amb to br for safety assistance. He refused.

## 2011-03-12 NOTE — Progress Notes (Signed)
PGY-1 Daily Progress Note Family Medicine Teaching Service Tyrone Cunningham. Marti Sleigh, MD Service Pager: [phone]   Subjective: able to tolerate ice chips last night. Not very willing to talk this morning. Says no concerns or questions. Unaware food try of liquids was at bedside but willing to try them. Mild diffuse abdominal pain but no nausea or vomiting.   Objective:  Temp:  [98.5 F (36.9 C)] 98.5 F (36.9 C) (01/09 0610) Pulse Rate:  [93-103] 95  (01/09 0610) Resp:  [16-20] 18  (01/09 0610) BP: (112-134)/(54-66) 134/66 mmHg (01/09 0610) SpO2:  [93 %-100 %] 96 % (01/09 0931) FiO2 (%):  [28 %] 28 % (01/09 0931)  Intake/Output Summary (Last 24 hours) at 03/12/11 1002 Last data filed at 03/12/11 0651  Gross per 24 hour  Intake   1600 ml  Output    575 ml  Net   1025 ml    Gen:  NAD, trach colar in place, obese HEENT: moist mucous membranes CV: Distant heart sounds, Regular rate and rhythm, no murmurs rubs or gallops PULM: clear to auscultation bilaterally but distant, decreased breath sounds ABD: diffuse very mild tenderness. Soft. Normal bowel sounds.  EXT: venous stasis changes. 2+ edema bilaterally Neuro: Alert and oriented x3 SKIN: Multiple small open wounds on upper and lower extremities, no overt signs of cellulitis or overwhelming infection  Labs and imaging:   CBC  Lab 03/12/11 0650 03/10/11 0535 03/09/11 1411  WBC 9.5 13.5* 13.7*  HGB 14.1 15.7 16.1  HCT 44.4 48.1 47.1  PLT 177 202 197   BMET  Lab 03/12/11 0650 03/10/11 0535 03/10/11 0036 03/09/11 1411  NA 140 136 -- 135  K 4.2 4.5 -- 4.3  CL 97 92* -- 91*  CO2 32 33* -- 33*  BUN 24* 31* -- 28*  CREATININE 0.88 0.97 0.91 --  CALCIUM 9.1 10.6* -- 10.9*  PROT -- -- -- 8.7*  BILITOT -- -- -- 0.4  ALKPHOS -- -- -- 72  ALT -- -- -- 23  AST -- -- -- 17  GLUCOSE 130* 145* -- 241*    CBG (last 3)   Basename 03/12/11 0413 03/12/11 0008 03/11/11 1956  GLUCAP 122* 121* 155*      Dg Abd 2  Views  03/11/2011  *RADIOLOGY REPORT*  Clinical Data: Small bowel obstruction  ABDOMEN - 2 VIEW  Comparison: 03/10/2011  Findings: Stable NG tube position.  Study is limited by patient's large body habitus.  There is nonspecific nonobstructive bowel gas pattern.  Moderate stool seen throughout the colon.  Postsurgical changes proximal left femur.  No free abdominal air.  IMPRESSION: NG tube in place.  Nonspecific nonobstructive bowel gas pattern. Moderate colonic stool. No free abdominal air.  Original Report Authenticated By: Natasha Mead, M.D.     Assessment  56 year old Caucasian male with morbid obesity who is status post appendectomy now presenting with a small bowel obstruction.  1. Small bowel obstruction: Clamped NG yesterday then d/ced and allowed ice chips, now advanced to clear liquids. Will advance diet as tolerated over the next 1-2 days; continue to defer to surgery for advancement; no current plans for surgical intervention. Home po meds held continue to monitor.    *Dilaudid IV 1mg  q2. only got 4 doses in 24 hours. Will switch back to chronic pain meds at home of vicodin and neurontin.  2. Respiratory (severe sleep apnea status post tracheostomy): Patient does not use CPAP at home, on home albuterol &Advair will continue home regimen at  this time    *Albuterol prn q4   *Advair    *Spiriva    *will place order to wean o2 as patient not on o2 at home  3. Cardiovascular  1. CHF: 1.  Unknown EF at this time (unable to calculate on last ECHO due to body habitus) however no clinical signs or symptoms of acute CHF exacerbation. No overt signs of failure. On 75 ml/hr IVF-will decrease to 50ml as patient to attempt PO today to avoid volume overload 1.  Holding lasix and lisinopril at this time will continue to monitor and make changes as appropriate. Would benefit from beta blocker as OP. Will hold off at this time 2. HTN-peak at 134/66. Well controlled without medication. Holding home  lisinopril  4. Endocrine (DM, HLD) - hold statin until POing well  *SSI -->received 8 units with blood sugars <155.  5. Anxiety due to stress-1mg  Ativan q6 yesterday. Will change to trazodone 50mg  q8hours due to desire to avoid respiratory depression.  6. Chronic pain-see problem #1     --- FEN/GI: Ice chips  --- IVFs: NS @ 20ml/hr-->50ml; will plan on KVO with toleration of po fluids  --- PPx: Heparin SQ & Protonix IV 40mg  qd --- Dispo: Surgery continuing to follow will defer to them for surgical plan. Advanced diet at their recommendation. We will continue to monitor and control HTN and chronic ongoing medical issues with IV medications at this time as needed. Will order PT today to avoid deconditioning.    Tana Conch, MD PGY1, Family Medicine Teaching Service [phone]

## 2011-03-12 NOTE — Progress Notes (Signed)
I interviewed and examined this patient and discussed the care plan with Dr. Durene Cal and the [facility] team and agree with assessment and plan as documented in the progress note for today. He is more talkative this afternoon. He's been tolerating clear liquids well and hopes the Miralax will cause a BM this evening. Still has little appetite.     Ashtan Girtman A. Sheffield Slider, MD Family Medicine Teaching Service Attending  03/12/2011 3:19 PM

## 2011-03-13 LAB — BASIC METABOLIC PANEL
BUN: 12 mg/dL (ref 6–23)
Calcium: 8.6 mg/dL (ref 8.4–10.5)
GFR calc Af Amer: >90 mL/min (ref >90)
GFR calc non Af Amer: >90 mL/min (ref >90)
Potassium: 5.1 mEq/L (ref 3.5–5.1)
Sodium: 134 mEq/L — ABNORMAL LOW (ref 135–145)

## 2011-03-13 LAB — GLUCOSE, CAPILLARY
Glucose-Capillary: 130 mg/dL — ABNORMAL HIGH (ref 70–99)
Glucose-Capillary: 168 mg/dL — ABNORMAL HIGH (ref 70–99)
Glucose-Capillary: 202 mg/dL — ABNORMAL HIGH (ref 70–99)
Glucose-Capillary: 205 mg/dL — ABNORMAL HIGH (ref 70–99)

## 2011-03-13 MED ORDER — LISINOPRIL 5 MG PO TABS
5.0000 mg | ORAL_TABLET | Freq: Every day | ORAL | Status: DC
  Administered 2011-03-13 – 2011-03-15 (×3): 5 mg via ORAL
  Filled 2011-03-13 (×3): qty 1

## 2011-03-13 MED ORDER — HYDROCODONE-ACETAMINOPHEN 5-325 MG PO TABS
1.0000 | ORAL_TABLET | Freq: Four times a day (QID) | ORAL | Status: DC
  Administered 2011-03-13 – 2011-03-15 (×9): 1 via ORAL
  Filled 2011-03-13 (×9): qty 1

## 2011-03-13 NOTE — Progress Notes (Signed)
Family Medicine Teaching Service Daily Resident Note   Patient name: Tyrone Cunningham Medical record number: [id number] Date of birth: 09/28/1955 Age: 56 y.o. Gender: male Length of Stay:  LOS: 4 days             SUBJECTIVE  Pt reports improved pain but is frustrated with having to ask for his pain meds since he is a chronic pain pt.  Diet is advancing and he is tolerating it well.  NO BM yet.  No other complaints at this time.  Pt does have home O2 on a PRN basis.  Pt refusing PT as this is no different than his baseline activity level.               OBJECTIVE  Vitals: Patient Vitals for the past 24 hrs:  BP Temp Temp src Pulse Resp SpO2  03/13/11 0934 - - - - - 98 %  03/13/11 0933 - - - 104  16  98 %  03/13/11 0334 - - - - - 93 %  03/13/11 0039 - - - - - 96 %  03/12/11 2230 153/84 mmHg 99.2 F (37.3 C) Oral 93  18  97 %  03/12/11 2215 - - - - - 99 %  03/12/11 1643 - - - - - 96 %  03/12/11 1351 130/55 mmHg 97.7 F (36.5 C) Oral 98  18  95 %  03/12/11 1211 - - - - - 94 %   Wt Readings from Last 3 Encounters:  03/09/11 360 lb 4.8 oz (163.431 kg)    Intake/Output Summary (Last 24 hours) at 03/13/11 0957 Last data filed at 03/13/11 0700  Gross per 24 hour  Intake 2901.25 ml  Output   1550 ml  Net 1351.25 ml    PE: GENERAL: Adult Caucasian obese male. Examined in [facility] 5100, in no acute distress, nasogastric tube is in place, trach collar in place. Able to speak with manual occlusion of tracheostomy site.  HNEENT: Atraumatic, normocephalic, trach collar in place, broad-based neck,  THORAX:  HEART: Distant heart sounds, regular rate appreciated, no murmur appreciated  LUNGS: Decreased breath sounds, clear to auscultation  ABDOMEN: Morbidly obese, positive however hypoactive bowel sounds, minimally tender over the central abdomen,  EXTREMITIES: 2+ out of 4 bilateral lower extremity edema, mild venous stasis changes  >SKIN: Multiple small open wounds on upper and lower extremities,  no overt signs of cellulitis or overwhelming infection   LABS: Hematology:  Lab 03/12/11 0650 03/10/11 0535 03/09/11 1411  WBC 9.5 13.5* 13.7*  HGB 14.1 15.7 16.1  HCT 44.4 48.1 47.1  PLT 177 202 197  RDW 15.2 15.4 14.9  MCV 91.4 90.6 87.9  MCHC 31.8 32.6 34.2   Metabolic:  Lab 03/13/11 0835 03/12/11 0650 03/10/11 0535  NA 134* 140 136  K 5.1 4.2 4.5  CL 97 97 92*  CO2 26 32 33*  BUN 12 24* 31*  CREATININE 0.66 0.88 0.97  GLUCOSE 145* 130* 145*  CALCIUM 8.6 9.1 10.6*  MG -- -- --  PHOS -- -- --    Lab 03/09/11 1411  ALKPHOS 72  BILITOT 0.4  BILIDIR --  PROT 8.7*  ALT 23  AST 17    Diabetes Management:  Lab 03/13/11 0746 03/13/11 0415 03/13/11 0006 03/12/11 2011 03/12/11 1719 03/12/11 1143 03/12/11 0745 03/12/11 0413 03/12/11 0008 03/11/11 1956  GLUCAP 168* 149* 182* 190* 144* 191* 124* 122* 121* 155*   MICRO None  IMAGING: none  ASSESSMENT & PLAN  56 year old Caucasian male with morbid obesity who is status post appendectomy now presenting with a small bowel obstruction.  1. Small bowel obstruction: Clamped NG 1/8 then d/ced and allowed ice chips, now advanced to full clear liquids. Will advance diet as tolerated over the next 1-2 days; continue to defer to surgery for advancement; no current plans for surgical intervention. Home po meds held continue to monitor. Will schedule pain meds   *Hydrocodone q 6o 2. Respiratory (severe sleep apnea status post tracheostomy): Patient does not use CPAP at home, on home albuterol &Advair will continue home regimen at this time    *Albuterol prn q4   *Advair    *Spiriva    *weaning o2 3. Cardiovascular (CHF & HTN) Unknown EF at this time (unable to calculate on last ECHO due to body habitus) however no clinical signs or symptoms of acute CHF exacerbation. No overt signs of failure.  153/84 peak today restart home lisinopril today; will continue to hold lasix.Would benefit from beta blocker as OP. Will hold off  at this time - decreased IVFs to 49ml/hr yesterday.   *lisinopril  4. Endocrine (DM, HLD) - hold statin until POing well  *SSI -->received 8 units with blood sugars <155.  5. Anxiety due to stress -Trazodone 50mg  q6o prn  6. Chronic pain-see problem #1     --- FEN/GI: Full Liquid diet, continue to advance.  On Miralax.  Avoiding stimulant laxatives --- IVFs: NS 25ml/hr  --- PPx: Heparin SQ & Protonix IV            DISPOSITION  Continue to advance diet per surgery recommendations.  D/c pending BM.  Continue to monitor BP and BCGs and continue to ween O2.  Has home O2 currently. Hopeful for d/c 1/11.  Gaspar Bidding, DO [facility] Resident - PGY-1 03/13/2011 9:57 AM

## 2011-03-13 NOTE — Progress Notes (Signed)
Pt adamantly refusing to participate with physical therapy at this time. States "I didn't come to the hospital to move around, I came because my belly is hurting." Reports he can do everything for himself when he isn't attached to so many cords. I offered to get them organized, he refused. Observed pt perform supine->sit modI with elevated bed and increased effort on his part. Per pt request PT will sign off at this point. Please reorder if pt more agreeable. Thanks!  Ivonne Andrew PT, DPT [phone]

## 2011-03-13 NOTE — Progress Notes (Signed)
  Subjective: No new complaints.  The abdominal pain is minimal. He is passing gas, however, has not had a bowel movement yet. He has received 2 doses of Miralax.  He is tolerating his clear liquid diet.   Objective: Vital signs in last 24 hours: Temp:  [97.7 F (36.5 C)-99.2 F (37.3 C)] 99.2 F (37.3 C) (01/09 2230) Pulse Rate:  [93-98] 93  (01/09 2230) Resp:  [18] 18  (01/09 2230) BP: (130-153)/(55-84) 153/84 mmHg (01/09 2230) SpO2:  [93 %-99 %] 93 % (01/10 0334) FiO2 (%):  [28 %] 28 % (01/10 0334) Last BM Date: 03/10/11  Intake/Output from previous day: 01/09 0701 - 01/10 0700 In: 2101.3 [P.O.:920; I.V.:1181.3] Out: 1550 [Urine:1550] Intake/Output this shift:    Physical exam: Gen: NAD Psych: less communicative today but alert and appropriate to questions Abd: decreased BS, obese, soft, NT, some distension   Lab Results:   [facility] 03/12/11 0650  WBC 9.5  HGB 14.1  HCT 44.4  PLT 177   BMET  Basename 03/12/11 0650  NA 140  K 4.2  CL 97  CO2 32  GLUCOSE 130*  BUN 24*  CREATININE 0.88  CALCIUM 9.1   PT/INR No results found for this basename: LABPROT:2,INR:2 in the last 72 hours ABG No results found for this basename: PHART:2,PCO2:2,PO2:2,HCO3:2 in the last 72 hours  Studies/Results: No results found.  Anti-infectives: Anti-infectives    None      Assessment/Plan: Small bowel obstruction. Resolving.  -Will advance diet. -Continue Miralax bid.  -Surgery signing-off. We appreciate the primary care team's help in the management of this patient.     LOS: 4 days    OH PARK, [place] 03/13/2011

## 2011-03-13 NOTE — Progress Notes (Signed)
Can advance diet, no evidence obstruction, call back if needed

## 2011-03-14 DIAGNOSIS — E1140 Type 2 diabetes mellitus with diabetic neuropathy, unspecified: Secondary | ICD-10-CM | POA: Insufficient documentation

## 2011-03-14 DIAGNOSIS — I509 Heart failure, unspecified: Secondary | ICD-10-CM | POA: Insufficient documentation

## 2011-03-14 DIAGNOSIS — J449 Chronic obstructive pulmonary disease, unspecified: Secondary | ICD-10-CM | POA: Insufficient documentation

## 2011-03-14 DIAGNOSIS — H9190 Unspecified hearing loss, unspecified ear: Secondary | ICD-10-CM | POA: Insufficient documentation

## 2011-03-14 DIAGNOSIS — E7800 Pure hypercholesterolemia, unspecified: Secondary | ICD-10-CM | POA: Insufficient documentation

## 2011-03-14 DIAGNOSIS — I10 Essential (primary) hypertension: Secondary | ICD-10-CM | POA: Insufficient documentation

## 2011-03-14 LAB — GLUCOSE, CAPILLARY
Glucose-Capillary: 205 mg/dL — ABNORMAL HIGH (ref 70–99)
Glucose-Capillary: 197 mg/dL — ABNORMAL HIGH (ref 70–99)
Glucose-Capillary: 174 mg/dL — ABNORMAL HIGH (ref 70–99)
Glucose-Capillary: 242 mg/dL — ABNORMAL HIGH (ref 70–99)

## 2011-03-14 MED ORDER — SENNOSIDES-DOCUSATE SODIUM 8.6-50 MG PO TABS
1.0000 | ORAL_TABLET | Freq: Every evening | ORAL | Status: DC | PRN
  Administered 2011-03-14: 1 via ORAL
  Filled 2011-03-14: qty 1

## 2011-03-14 MED ORDER — INSULIN ASPART 100 UNIT/ML ~~LOC~~ SOLN
0.0000 [IU] | Freq: Three times a day (TID) | SUBCUTANEOUS | Status: DC
  Administered 2011-03-14: 5 [IU] via SUBCUTANEOUS
  Administered 2011-03-15 (×2): 3 [IU] via SUBCUTANEOUS

## 2011-03-14 MED ORDER — GLYCERIN (LAXATIVE) 2.1 G RE SUPP
1.0000 | Freq: Once | RECTAL | Status: AC
  Administered 2011-03-14: 1 via RECTAL
  Filled 2011-03-14: qty 1

## 2011-03-14 NOTE — Progress Notes (Signed)
I interviewed and examined this patient and discussed the care plan with Dr. Berline Chough and the [facility] team and agree with assessment and plan as documented in the progress note for today.    Tyrone Stineman A. Sheffield Slider, MD Family Medicine Teaching Service Attending  03/14/2011 6:02 PM

## 2011-03-14 NOTE — Discharge Summary (Signed)
Family Medicine Teaching Service DISCHARGE SUMMARY   Patient name: Tyrone Cunningham Medical record number: [id number] Date of birth: May 20, 1955 Age: 56 y.o. Gender: male  Attending Physician: Carney Living, MD Primary Care Provider: Lucilla Edin, MD, MD Consultants: general surgery  Dates of Hospitalization:  03/09/2011 to 03/15/2011 Length of Stay: 6 days  Admission Diagnoses:  Abdominal Pain.   Discharge Diagnoses:  Active Problems:  Small bowel obstruction  Patient Active Problem List  Diagnoses Date Noted  . Bronchitis   . CHF (congestive heart failure)   . Hypertension   . Hypercholesteremia   . Hearing loss   . Peripheral neuropathy   . Small bowel obstruction 03/10/2011  . Sleep apnea     Brief Hospital Course   Tyrone Cunningham is a 56 y.o. year old male who presented with a 2 day history of abdominal pain & anorexia to his PCP who was concerned for SBO due to prior episodes of similar symptoms.  He was transported to the ED by EMS for further evaluation.  On CT scan he was found to have a partial SBO and a NG tube was placed for bowel decompression.  General surgery was consulted and directed the advancement of his diet and ordered an enema that resulted in 1 large bowel movement.  His NG tube was removed on day 3 of hospitalization and his diet was advanced.  The pt did have flatus and was tolerating his advancing diet well however still had not had a BM on his own until day 6 of hospitalization with the assistance of glycerine suppositories and sennacot laxative.  He was felt ready for discharge at that time and is to return to his home regimen of medications for his ongoing chronic medical problems.  He will be maintained on a bowel regimen of metamucil (reports he ran out prior to this episode) as well as provided a prescription for PEG PRN.   The patient did complain of anxiety and difficulty sleeping.  He was initiated on Trazodone and responded well to it.  He request a  prescription at time of discharge and will need to discuss this further with his PCP.  He additionally was treated with Spiriva during this hospitalization for his chronic lung disease to help reduce his chronic albuterol dependence and will be provided a rx for this at time of d/c; continue home advair and albuterol.    Significant Diagnostics:  Hematology:  Lab 03/12/11 0650 03/10/11 0535 03/09/11 1411  WBC 9.5 13.5* 13.7*  HGB 14.1 15.7 16.1  HCT 44.4 48.1 47.1  PLT 177 202 197  RDW 15.2 15.4 14.9  MCV 91.4 90.6 87.9  MCHC 31.8 32.6 34.2   Metabolic:  Lab 03/13/11 0835 03/12/11 0650 03/10/11 0535  NA 134* 140 136  K 5.1 4.2 4.5  CL 97 97 92*  CO2 26 32 33*  BUN 12 24* 31*  CREATININE 0.66 0.88 0.97  GLUCOSE 145* 130* 145*  CALCIUM 8.6 9.1 10.6*  MG -- -- --  PHOS -- -- --    Lab 03/09/11 1411  ALKPHOS 72  BILITOT 0.4  BILIDIR --  PROT 8.7*  ALT 23  AST 17    Lab 03/15/11 0731 03/14/11 2135 03/14/11 1658 03/14/11 1226 03/14/11 0746 03/14/11 0402 03/13/11 2359 03/13/11 2023 03/13/11 1640 03/13/11 1148  GLUCAP 176* 242* 203* 174* 197* 205* 205* 202* 130* 234*   MICRO None  IMAGING: 03/09/11 - Acute Abdomen Series: 1. Cardiomegaly, chronic bronchitic changes and chronic  interstitial lung disease. 2. Gastric distention. 03/09/11 - CT Abdomen/Pelvis 1. Morbid obesity. 2. Proximal small bowel obstruction likely due to adhesions in the mid abdomen. 3. Diffuse fatty infiltration of the liver and gallbladder distention. 4. Mild distention of the bladder. 03/10/11 - Ab XRay: Decompressed stomach with nasogastric tube placed 03/11/11 - Ab XRay: NG tube in place. Nonspecific nonobstructive bowel gas pattern. Moderate colonic stool. No free abdominal air.  Procedures:   NG Tube from 1/6 - 1/9  Discharge Vitals and Exam:  Vitals: Patient Vitals for the past 24 hrs:  BP Temp Temp src Pulse Resp SpO2  03/15/11 0827 - - - - - 94 %  03/15/11 0825 - - - - - 94 %  03/15/11  0603 97/53 mmHg 97.7 F (36.5 C) Oral 102  18  100 %  03/15/11 0423 - - - 85  18  98 %  03/14/11 2317 - - - 86  20  94 %  03/14/11 2147 139/81 mmHg 98.9 F (37.2 C) Oral 87  19  91 %  03/14/11 1929 - - - 85  20  94 %  03/14/11 1546 - - - - - 92 %  03/14/11 1500 143/82 mmHg 98.6 F (37 C) Oral 86  20  97 %  03/14/11 1204 - - - - - 98 %  03/14/11 1201 - - - 90  18  96 %   Wt Readings from Last 3 Encounters:  03/09/11 360 lb 4.8 oz (163.431 kg)    Intake/Output Summary (Last 24 hours) at 03/15/11 0948 Last data filed at 03/15/11 0436  Gross per 24 hour  Intake    480 ml  Output   2000 ml  Net  -1520 ml   PE: GENERAL: Adult Caucasian obese male. Examined in [facility] 5100, in no acute distress, nasogastric tube is in place, trach collar in place. Able to speak with manual occlusion of tracheostomy site.  HNEENT: Atraumatic, normocephalic, trach collar in place, broad-based neck,  THORAX:  HEART: Distant heart sounds, regular rate appreciated, no murmur appreciated  LUNGS: Decreased breath sounds, clear to auscultation  ABDOMEN: Morbidly obese, positive however hypoactive bowel sounds, minimally tender over the central abdomen,  EXTREMITIES: 2+ out of 4 bilateral lower extremity edema, mild venous stasis changes  >SKIN: Multiple small open wounds on upper and lower extremities; no infection, improved.      Discharge Information   Disposition: to home Discharge Diet: Resume diet Discharge Condition:  Improved Discharge Activity: Ad lib   Current Discharge Medication List    START taking these medications   Details  polyethylene glycol powder (MIRALAX) powder Take 17 g by mouth daily. Qty: 255 g, Refills: 0    psyllium (METAMUCIL SMOOTH TEXTURE) 28 % packet Take 1 packet by mouth 2 (two) times daily.    tiotropium (SPIRIVA) 18 MCG inhalation capsule Place 1 capsule (18 mcg total) into inhaler and inhale daily. Qty: 30 capsule, Refills: 0    traZODone (DESYREL) 50 MG tablet  Take 1 tablet (50 mg total) by mouth at bedtime. Qty: 30 tablet, Refills: 0      CONTINUE these medications which have NOT CHANGED   Details  albuterol (PROVENTIL HFA;VENTOLIN HFA) 108 (90 BASE) MCG/ACT inhaler Inhale 2 puffs into the lungs every 6 (six) hours as needed.      atorvastatin (LIPITOR) 10 MG tablet Take 10 mg by mouth daily.      Fluticasone-Salmeterol (ADVAIR) 500-50 MCG/DOSE AEPB Inhale 1 puff into the lungs every  12 (twelve) hours.      furosemide (LASIX) 40 MG tablet Take 40 mg by mouth daily.      gabapentin (NEURONTIN) 300 MG capsule Take 300 mg by mouth 3 (three) times daily.      HYDROcodone-acetaminophen (NORCO) 5-325 MG per tablet Take 1 tablet by mouth every 6 (six) hours as needed.     insulin aspart (NOVOLOG) 100 UNIT/ML injection Inject 6-25 Units into the skin 4 (four) times daily. On sliding scale    insulin glargine (LANTUS) 100 UNIT/ML injection Inject 80 Units into the skin every morning.     lisinopril (PRINIVIL,ZESTRIL) 5 MG tablet Take 5 mg by mouth daily.          Follow Up Issues and Recommendations:    Discharge Orders    Future Appointments: Provider: Department: Dept Phone: Center:   05/20/2011 2:00 PM Lucilla Edin, MD [facility] [phone] [facility]     Future Orders Please Complete By Expires   Diet - low sodium heart healthy      Increase activity slowly         Pending Results: none  Will need follow up regarding his bowel habits.  Pt is to continue with Miralax daily until on regular BM schedule and consider daily fiber supplementation.  Follow respiratory status with new addition of Spiriva.  Will need close follow up of his chronic medical conditions and pt would benefit from dietary modification as an OP.    Gaspar Bidding, DO [facility] Resident - PGY-1 03/15/2011 9:48 AM

## 2011-03-14 NOTE — Progress Notes (Signed)
Family Medicine Teaching Service Daily Resident Note   Patient name: Tyrone Cunningham Medical record number: [id number] Date of birth: Oct 19, 1955 Age: 56 y.o. Gender: male Length of Stay:  LOS: 5 days             SUBJECTIVE  Pt with no new complaints. No BM.  + Flatus             OBJECTIVE  Vitals: Patient Vitals for the past 24 hrs:  BP Temp Temp src Pulse Resp SpO2  03/14/11 1204 - - - - - 98 %  03/14/11 1201 - - - 90  18  96 %  03/14/11 0800 - - - - - 98 %  03/14/11 0618 124/69 mmHg 98.5 F (36.9 C) Oral 84  16  97 %  03/14/11 0000 - - - 88  16  97 %  03/13/11 2140 129/62 mmHg 98.7 F (37.1 C) Oral 87  16  97 %  03/13/11 2059 - - - 89  16  100 %  03/13/11 1641 - - - 91  16  99 %  03/13/11 1434 119/65 mmHg 98.4 F (36.9 C) Oral 94  18  92 %  03/13/11 1250 - - - 92  16  97 %   Wt Readings from Last 3 Encounters:  03/09/11 360 lb 4.8 oz (163.431 kg)    Intake/Output Summary (Last 24 hours) at 03/14/11 1248 Last data filed at 03/14/11 1227  Gross per 24 hour  Intake 1700.81 ml  Output   1925 ml  Net -224.19 ml    PE: GENERAL: Adult Caucasian obese male. Examined in [facility] 5100, in no acute distress, nasogastric tube is in place, trach collar in place. Able to speak with manual occlusion of tracheostomy site.  HNEENT: Atraumatic, normocephalic, trach collar in place, broad-based neck,  THORAX:  HEART: Distant heart sounds, regular rate appreciated, no murmur appreciated  LUNGS: Decreased breath sounds, clear to auscultation  ABDOMEN: Morbidly obese, positive however hypoactive bowel sounds, minimally tender over the central abdomen,  EXTREMITIES: 2+ out of 4 bilateral lower extremity edema, mild venous stasis changes  >SKIN: Multiple small open wounds on upper and lower extremities, no overt signs of cellulitis or overwhelming infection  LABS: Hematology:  Lab 03/12/11 0650 03/10/11 0535 03/09/11 1411  WBC 9.5 13.5* 13.7*  HGB 14.1 15.7 16.1  HCT 44.4 48.1 47.1  PLT  177 202 197  RDW 15.2 15.4 14.9  MCV 91.4 90.6 87.9  MCHC 31.8 32.6 34.2   Metabolic:  Lab 03/13/11 0835 03/12/11 0650 03/10/11 0535  NA 134* 140 136  K 5.1 4.2 4.5  CL 97 97 92*  CO2 26 32 33*  BUN 12 24* 31*  CREATININE 0.66 0.88 0.97  GLUCOSE 145* 130* 145*  CALCIUM 8.6 9.1 10.6*  MG -- -- --  PHOS -- -- --    Lab 03/09/11 1411  ALKPHOS 72  BILITOT 0.4  BILIDIR --  PROT 8.7*  ALT 23  AST 17    Lab 03/14/11 1226 03/14/11 0746 03/14/11 0402 03/13/11 2359 03/13/11 2023 03/13/11 1640 03/13/11 1148 03/13/11 0746 03/13/11 0415 03/13/11 0006  GLUCAP 174* 197* 205* 205* 202* 130* 234* 168* 149* 182*   MICRO None  IMAGING: No new            ASSESSMENT & PLAN  56 year old Caucasian male with morbid obesity who is status post appendectomy now presenting with a small bowel obstruction.   1. Small bowel obstruction: Clamped NG 1/8  then d/ced and allowed ice chips, now advanced to full clear liquids. Will advance diet as tolerated over the next 1-2 days; continue to defer to surgery for advancement; no current plans for surgical intervention. Home po meds held continue to monitor. Will schedule pain meds *Hydrocodone q 6o   2. Respiratory (severe sleep apnea status post tracheostomy): Patient does not use CPAP at home, on home albuterol &Advair will continue home regimen at this time  *Albuterol prn q4  *Advair  *Spiriva  *weaning o2   3. Cardiovascular (CHF & HTN) Unknown EF at this time (unable to calculate on last ECHO due to body habitus) however no clinical signs or symptoms of acute CHF exacerbation. No overt signs of failure. 153/84 peak today restart home lisinopril today; will continue to hold lasix.Would benefit from beta blocker as OP. Will hold off at this time - decreased IVFs to 44ml/hr yesterday.  *lisinopril   4. Endocrine (DM, HLD) - hold statin until POing well *SSI -->received 8 units with blood sugars <155.   5. Anxiety due to stress *Trazodone 50mg   q6o prn   6. Chronic pain-see problem #1   --- FEN/GI: Bariatric Diet. On Miralax BID. Glycerine suppository X 1 --- IVFs: KVO --- PPx: Heparin SQ & Protonix IV            DISPOSITION  Continue to advance diet per surgery recommendations. D/c pending BM. Continue to monitor BP and BCGs and continue to ween O2. Has home O2 currently. Hopeful for d/c later today.  If not in AM post BM.    Gaspar Bidding, DO [facility] Resident - PGY-1 03/14/2011 12:48 PM

## 2011-03-14 NOTE — Progress Notes (Signed)
I interviewed and examined this patient and discussed the care plan with Dr. Berline Chough and the [facility] team and agree with assessment and plan as documented in the progress note for today.    Nikyla Navedo A. Sheffield Slider, MD Family Medicine Teaching Service Attending  03/14/2011 6:11 AM

## 2011-03-14 NOTE — Progress Notes (Signed)
Glycerin supp given earlier without results, passing flatus, no bm. Given prune juice in addition to Miralax.

## 2011-03-15 LAB — GLUCOSE, CAPILLARY: Glucose-Capillary: 176 mg/dL — ABNORMAL HIGH (ref 70–99)

## 2011-03-15 MED ORDER — TRAZODONE HCL 50 MG PO TABS: 50.0000 mg | ORAL_TABLET | Freq: Every day | ORAL | Status: AC

## 2011-03-15 MED ORDER — POLYETHYLENE GLYCOL 3350 17 GM/SCOOP PO POWD: 17.0000 g | Freq: Every day | ORAL | Status: AC

## 2011-03-15 MED ORDER — PSYLLIUM 28 % PO PACK: 1.0000 | PACK | Freq: Two times a day (BID) | ORAL | Status: DC

## 2011-03-15 MED ORDER — TIOTROPIUM BROMIDE MONOHYDRATE 18 MCG IN CAPS: 18.0000 ug | ORAL_CAPSULE | Freq: Every day | RESPIRATORY_TRACT | Status: DC

## 2011-03-15 NOTE — Discharge Summary (Signed)
I interviewed and examined this patient and discussed the care plan with Dr. Berline Chough and the [facility] team and agree with assessment and plan as documented in the discharge note for today.    Jamear Carbonneau A. Sheffield Slider, MD Family Medicine Teaching Service Attending  03/15/2011 12:47 PM

## 2011-03-15 NOTE — Discharge Planning (Signed)
DISCHARGED PER WC WITH ALL PERSONAL BELONGINGS, COPY OF HOME INSTRUCTIONS. PT VERBALIZES UNDERSTANDING OF INSTRUCTIONS, RX SENT TO PT PHARMACY.  ACOMP. BY BROTHER.

## 2011-03-15 NOTE — Progress Notes (Signed)
Pt reported having a small BM this PM.

## 2011-03-21 NOTE — ED Provider Notes (Signed)
Medical screening examination/treatment/procedure(s) were performed by non-physician practitioner and as supervising physician I was immediately available for consultation/collaboration.    Zayda Angell L Derrick Orris, MD 03/21/11 2245 

## 2011-03-26 ENCOUNTER — Ambulatory Visit (INDEPENDENT_AMBULATORY_CARE_PROVIDER_SITE_OTHER): Payer: Medicare Other

## 2011-03-26 DIAGNOSIS — K56609 Unspecified intestinal obstruction, unspecified as to partial versus complete obstruction: Secondary | ICD-10-CM | POA: Diagnosis not present

## 2011-03-26 DIAGNOSIS — J449 Chronic obstructive pulmonary disease, unspecified: Secondary | ICD-10-CM | POA: Diagnosis not present

## 2011-03-26 DIAGNOSIS — G4733 Obstructive sleep apnea (adult) (pediatric): Secondary | ICD-10-CM | POA: Diagnosis not present

## 2011-04-03 DIAGNOSIS — Z9181 History of falling: Secondary | ICD-10-CM | POA: Diagnosis not present

## 2011-04-03 DIAGNOSIS — R269 Unspecified abnormalities of gait and mobility: Secondary | ICD-10-CM | POA: Diagnosis not present

## 2011-04-03 DIAGNOSIS — M5430 Sciatica, unspecified side: Secondary | ICD-10-CM | POA: Diagnosis not present

## 2011-04-03 DIAGNOSIS — H81399 Other peripheral vertigo, unspecified ear: Secondary | ICD-10-CM | POA: Diagnosis not present

## 2011-04-03 DIAGNOSIS — M545 Low back pain: Secondary | ICD-10-CM | POA: Diagnosis not present

## 2011-04-03 DIAGNOSIS — F341 Dysthymic disorder: Secondary | ICD-10-CM | POA: Diagnosis not present

## 2011-04-03 DIAGNOSIS — G541 Lumbosacral plexus disorders: Secondary | ICD-10-CM | POA: Diagnosis not present

## 2011-04-03 DIAGNOSIS — R42 Dizziness and giddiness: Secondary | ICD-10-CM | POA: Diagnosis not present

## 2011-04-10 ENCOUNTER — Telehealth: Payer: Self-pay

## 2011-04-10 NOTE — Telephone Encounter (Signed)
.  [facility] PT STATES DR DAUB HAD WANTED HIM TO GET THE NEBULIZER TO TAKE SEVERAL TIMES A DAY AND DIDN'T KNOW WHETHER DR DAUB WANTED HIM TO CONTINUE TAKING IT. PLEASE CALL [phone] AND HE USES [facility]

## 2011-04-11 ENCOUNTER — Other Ambulatory Visit (HOSPITAL_COMMUNITY): Payer: Self-pay | Admitting: Sports Medicine

## 2011-04-12 NOTE — Telephone Encounter (Signed)
Med records please pull chart.

## 2011-04-14 NOTE — Telephone Encounter (Signed)
Dr. Cleta Alberts,  Do you want this patient to continue nebs?   The chart is in your box in the provider lounge.  csj

## 2011-04-14 NOTE — Telephone Encounter (Signed)
CHART AT NURSES STATION. PLEASE REVIEW

## 2011-04-14 NOTE — Telephone Encounter (Signed)
Please contact Tyrone Cunningham at him now I do want to continue his nebulizers as well as he's continuing to have respiratory difficulty. It is okay to call in refills for his nebulizer.

## 2011-04-15 NOTE — Telephone Encounter (Signed)
Please relay Dr. Ellis Parents response tot he patient. csj

## 2011-04-15 NOTE — Telephone Encounter (Signed)
LMOM to CB. 

## 2011-04-15 NOTE — Telephone Encounter (Signed)
Pt called back and said the only thing he wanted to know when he called [place] was whether Dr Cleta Alberts had increased his nebulizer medication, as discussed at OV, before he called to reorder it. Pt stated that he called [facility] pharmacy and it appears that it was increased. Asked pt if we needed to check with pharmacy to make sure it was correct. Pt stated he thought it was done. Pt agreed to CB if there was a problem and he needed [place] to contact pharmacy.

## 2011-04-16 ENCOUNTER — Other Ambulatory Visit: Payer: Self-pay | Admitting: Family Medicine

## 2011-04-16 MED ORDER — INSULIN GLARGINE 100 UNIT/ML ~~LOC~~ SOLN: 80.0000 [IU] | SUBCUTANEOUS | Status: DC

## 2011-04-29 ENCOUNTER — Other Ambulatory Visit: Payer: Self-pay | Admitting: Physician Assistant

## 2011-04-29 DIAGNOSIS — F4542 Pain disorder with related psychological factors: Secondary | ICD-10-CM | POA: Diagnosis not present

## 2011-04-29 DIAGNOSIS — H81399 Other peripheral vertigo, unspecified ear: Secondary | ICD-10-CM | POA: Diagnosis not present

## 2011-04-29 DIAGNOSIS — M545 Low back pain: Secondary | ICD-10-CM | POA: Diagnosis not present

## 2011-04-29 DIAGNOSIS — F341 Dysthymic disorder: Secondary | ICD-10-CM | POA: Diagnosis not present

## 2011-04-29 DIAGNOSIS — G541 Lumbosacral plexus disorders: Secondary | ICD-10-CM | POA: Diagnosis not present

## 2011-04-29 MED ORDER — TIOTROPIUM BROMIDE MONOHYDRATE 18 MCG IN CAPS: 18.0000 ug | ORAL_CAPSULE | Freq: Every day | RESPIRATORY_TRACT | Status: DC

## 2011-05-02 ENCOUNTER — Other Ambulatory Visit: Payer: Self-pay

## 2011-05-02 MED ORDER — FUROSEMIDE 40 MG PO TABS: 40.0000 mg | ORAL_TABLET | Freq: Every day | ORAL | Status: DC

## 2011-05-03 ENCOUNTER — Other Ambulatory Visit: Payer: Self-pay | Admitting: *Deleted

## 2011-05-03 MED ORDER — FUROSEMIDE 40 MG PO TABS: ORAL_TABLET | ORAL | Status: DC

## 2011-05-17 ENCOUNTER — Other Ambulatory Visit: Payer: Self-pay | Admitting: Emergency Medicine

## 2011-05-20 ENCOUNTER — Ambulatory Visit (INDEPENDENT_AMBULATORY_CARE_PROVIDER_SITE_OTHER): Payer: Medicare Other | Admitting: Emergency Medicine

## 2011-05-20 DIAGNOSIS — R52 Pain, unspecified: Secondary | ICD-10-CM

## 2011-05-20 DIAGNOSIS — I10 Essential (primary) hypertension: Secondary | ICD-10-CM

## 2011-05-20 DIAGNOSIS — E782 Mixed hyperlipidemia: Secondary | ICD-10-CM | POA: Diagnosis not present

## 2011-05-20 DIAGNOSIS — E669 Obesity, unspecified: Secondary | ICD-10-CM

## 2011-05-20 DIAGNOSIS — G4733 Obstructive sleep apnea (adult) (pediatric): Secondary | ICD-10-CM

## 2011-05-20 DIAGNOSIS — E785 Hyperlipidemia, unspecified: Secondary | ICD-10-CM

## 2011-05-20 LAB — POCT GLYCOSYLATED HEMOGLOBIN (HGB A1C): Hemoglobin A1C: 9.9

## 2011-05-20 NOTE — Progress Notes (Signed)
  Subjective:    Patient ID: Tyrone Cunningham, male    DOB: May 08, 1955, 56 y.o.   MRN: [id number]  HPI patient in for recheck. He still is under a great deal of stress related to his situation at home. He has forgot about taking care of his diabetes eats whatever he wants. His breathing has been stable he has had no worsening.    Review of Systems specifically denies chest pain or worsening shortness of breath. His feet have been okay without any new sores or swelling.     Objective:   Physical Exam  HENT:  Head: Normocephalic.  Neck:       Neck exam reveals tracheostomy site to be patent.  Cardiovascular: Normal rate and regular rhythm.  Exam reveals no gallop and no friction rub.   No murmur heard. Pulmonary/Chest: No respiratory distress. He has no wheezes. He has no rales. He exhibits no tenderness.          Assessment & Plan:   Assessment is diabetes not well controlled due to poor compliance. We'll check glucose and hemoglobin A1c. His recovery status is stable. His feet appear to show no signs of ulceration or cellulitis. Hemoglobin A1c 9.9 glucose 268.

## 2011-05-26 ENCOUNTER — Other Ambulatory Visit: Payer: Self-pay | Admitting: *Deleted

## 2011-05-26 MED ORDER — ATORVASTATIN CALCIUM 10 MG PO TABS: 10.0000 mg | ORAL_TABLET | Freq: Every day | ORAL | Status: DC

## 2011-05-27 ENCOUNTER — Other Ambulatory Visit: Payer: Self-pay | Admitting: Physician Assistant

## 2011-05-27 DIAGNOSIS — F4542 Pain disorder with related psychological factors: Secondary | ICD-10-CM | POA: Diagnosis not present

## 2011-05-27 DIAGNOSIS — F341 Dysthymic disorder: Secondary | ICD-10-CM | POA: Diagnosis not present

## 2011-05-27 DIAGNOSIS — M542 Cervicalgia: Secondary | ICD-10-CM | POA: Diagnosis not present

## 2011-05-27 DIAGNOSIS — Z79899 Other long term (current) drug therapy: Secondary | ICD-10-CM | POA: Diagnosis not present

## 2011-05-27 DIAGNOSIS — G541 Lumbosacral plexus disorders: Secondary | ICD-10-CM | POA: Diagnosis not present

## 2011-05-27 DIAGNOSIS — H81399 Other peripheral vertigo, unspecified ear: Secondary | ICD-10-CM | POA: Diagnosis not present

## 2011-05-28 ENCOUNTER — Other Ambulatory Visit: Payer: Self-pay | Admitting: Physician Assistant

## 2011-06-05 ENCOUNTER — Encounter: Payer: Self-pay | Admitting: Physician Assistant

## 2011-07-01 ENCOUNTER — Telehealth: Payer: Self-pay

## 2011-07-01 NOTE — Telephone Encounter (Signed)
PT CAME BY 104 TO GET ALBUTEROL RX CLEARED UP. LAST VISIT DR DAUB RAISED ALBUTEROL TO 5X PER DAY. BUT THE CURRENT REFILL WAS FOR 3X PER DAY. HE IS NOT DUE FOR A REFILL UNTIL MAY 10, BUT HE IS GOING TO RUN OUT BEFORE THEN DUE TO TAKING UP TO 5X/DAY. CAN YOU CHECK INTO THIS PLEASE.  HE GETS HIS MEDS THRU [facility]. THEY SAY THE RX IS FOR 3X.DAY.

## 2011-07-03 NOTE — Telephone Encounter (Signed)
The patient called again regarding his Rx for Albuterol.  Patient states that he will run out of Rx before May 10th and that the rationing of his Albuterol is causing him panic attacks.  Please call patient at [phone]

## 2011-07-03 NOTE — Telephone Encounter (Signed)
Pt reports that it is not the inhaler he needs, but his nebulizer albuterol med that he gets through [facility] that he is afraid he will run out of bf the 10th. Pt thinks he will have enough through the wkend until Dr Cleta Alberts can review this mes. Dr Cleta Alberts did you intend to inc his neb tx to 5 x day? Pt reports that some days he only needs 4, but sometimes does need 5 txs.

## 2011-07-04 ENCOUNTER — Other Ambulatory Visit: Payer: Self-pay | Admitting: Physician Assistant

## 2011-07-04 NOTE — Telephone Encounter (Signed)
Called [facility] and gave VO for change in his nebulizer meds to be used up to 5 x day. They have to fax over a new order to be signed, but will go ahead and ship meds according to old Rx so that he will receive it on Mon since he is almost out, and then will ship him the balance after they receive new order back. LMOM for pt to give him this info.

## 2011-07-04 NOTE — Telephone Encounter (Signed)
Please call patient and let him know we will call the pharmacy so he can have up to 5 neb. Treatments per day. Please go ahead and call this into the pharmacy

## 2011-07-10 ENCOUNTER — Other Ambulatory Visit: Payer: Self-pay | Admitting: Physician Assistant

## 2011-07-15 DIAGNOSIS — H7200 Central perforation of tympanic membrane, unspecified ear: Secondary | ICD-10-CM | POA: Diagnosis not present

## 2011-07-15 DIAGNOSIS — G4730 Sleep apnea, unspecified: Secondary | ICD-10-CM | POA: Diagnosis not present

## 2011-07-22 DIAGNOSIS — F4542 Pain disorder with related psychological factors: Secondary | ICD-10-CM | POA: Diagnosis not present

## 2011-07-22 DIAGNOSIS — H81399 Other peripheral vertigo, unspecified ear: Secondary | ICD-10-CM | POA: Diagnosis not present

## 2011-07-22 DIAGNOSIS — F341 Dysthymic disorder: Secondary | ICD-10-CM | POA: Diagnosis not present

## 2011-07-22 DIAGNOSIS — G541 Lumbosacral plexus disorders: Secondary | ICD-10-CM | POA: Diagnosis not present

## 2011-07-27 ENCOUNTER — Other Ambulatory Visit: Payer: Self-pay | Admitting: Physician Assistant

## 2011-07-27 ENCOUNTER — Other Ambulatory Visit: Payer: Self-pay | Admitting: Internal Medicine

## 2011-07-30 ENCOUNTER — Telehealth: Payer: Self-pay | Admitting: Family Medicine

## 2011-07-30 NOTE — Telephone Encounter (Signed)
Dr. Cleta Alberts completed forms for patient re: [facility] certification.  LMOM for patient to CB--would he like them mailed to him?

## 2011-07-30 NOTE — Telephone Encounter (Signed)
Forms are in TLs box.Marland KitchenMarland Kitchen

## 2011-08-01 ENCOUNTER — Ambulatory Visit (INDEPENDENT_AMBULATORY_CARE_PROVIDER_SITE_OTHER): Payer: Medicare Other | Admitting: Emergency Medicine

## 2011-08-01 VITALS — BP 125/85 | HR 103 | Temp 98.7°F | Resp 18

## 2011-08-01 DIAGNOSIS — R0602 Shortness of breath: Secondary | ICD-10-CM

## 2011-08-01 DIAGNOSIS — B86 Scabies: Secondary | ICD-10-CM

## 2011-08-01 DIAGNOSIS — R21 Rash and other nonspecific skin eruption: Secondary | ICD-10-CM | POA: Diagnosis not present

## 2011-08-01 DIAGNOSIS — G4733 Obstructive sleep apnea (adult) (pediatric): Secondary | ICD-10-CM | POA: Diagnosis not present

## 2011-08-01 DIAGNOSIS — J45909 Unspecified asthma, uncomplicated: Secondary | ICD-10-CM | POA: Diagnosis not present

## 2011-08-01 MED ORDER — ALBUTEROL SULFATE (2.5 MG/3ML) 0.083% IN NEBU: 2.5000 mg | INHALATION_SOLUTION | Freq: Once | RESPIRATORY_TRACT | Status: DC

## 2011-08-01 MED ORDER — IPRATROPIUM BROMIDE 0.02 % IN SOLN: 0.5000 mg | Freq: Once | RESPIRATORY_TRACT | Status: DC

## 2011-08-01 MED ORDER — PERMETHRIN 5 % EX CREA: TOPICAL_CREAM | Freq: Once | CUTANEOUS | Status: DC

## 2011-08-01 MED ORDER — ALBUTEROL SULFATE (2.5 MG/3ML) 0.083% IN NEBU
2.5000 mg | INHALATION_SOLUTION | Freq: Once | RESPIRATORY_TRACT | Status: AC
  Administered 2011-08-01: 2.5 mg via RESPIRATORY_TRACT

## 2011-08-01 NOTE — Progress Notes (Signed)
  Subjective:    Patient ID: Tyrone Cunningham, male    DOB: August 09, 1955, 56 y.o.   MRN: [id number]  HPI patient came because his girlfriend was diagnosed with scabies. He is in a good treatment for this disorder he left his O2 at home. He forgot to get his himself and nebulizer treatments before he came. He feels somewhat short of breath with a cough and some wheezing the    Review of Systems     Objective:   Physical Exam physical exam reveals a gentleman with tracheostomy tube was not in distress. His chest exam reveals occasional diffuse wheezes with poor air exchange. Cardiac exam reveals a rapid rate but no murmurs.  Examination of the skin reveals multiple excoriated areas on his arms and legs.      Assessment & Plan:  Go ahead and give a neb treatment along with some O2 for now so he would be stable to go home on a scooter. Have called in a prescription for Elimite to use for his scabies. Patient's O2 increased to 91 and he felt significantly better after a albuterol treatment. He is going to go ahead and pick up his prescription go home and use his regular medications.

## 2011-08-01 NOTE — Patient Instructions (Signed)
Scabies Scabies are small bugs (mites) that burrow under the skin and cause red bumps and severe itching. These bugs can only be seen with a microscope. Scabies are highly contagious. They can spread easily from person to person by direct contact. They are also spread through sharing clothing or linens that have the scabies mites living in them. It is not unusual for an entire family to become infected through shared towels, clothing, or bedding.  HOME CARE INSTRUCTIONS   Your caregiver may prescribe a cream or lotion to kill the mites. If this cream is prescribed; massage the cream into the entire area of the body from the neck to the bottom of both feet. Also massage the cream into the scalp and face if your child is less than 1 year old. Avoid the eyes and mouth.   Leave the cream on for 8 to12 hours. Do not wash your hands after application. Your child should bathe or shower after the 8 to 12 hour application period. Sometimes it is helpful to apply the cream to your child at right before bedtime.   One treatment is usually effective and will eliminate approximately 95% of infestations. For severe cases, your caregiver may decide to repeat the treatment in 1 week. Everyone in your household should be treated with one application of the cream.   New rashes or burrows should not appear after successful treatment within 24 to 48 hours; however the itching and rash may last for 2 to 4 weeks after successful treatment. If your symptoms persist longer than this, see your caregiver.   Your caregiver also may prescribe a medication to help with the itching or to help the rash go away more quickly.   Scabies can live on clothing or linens for up to 3 days. Your entire child's recently used clothing, towels, stuffed toys, and bed linens should be washed in hot water and then dried in a dryer for at least 20 minutes on high heat. Items that cannot be washed should be enclosed in a plastic bag for at least 3  days.   To help relieve itching, bathe your child in a cool bath or apply cool washcloths to the affected areas.   Your child may return to school after treatment with the prescribed cream.  SEEK MEDICAL CARE IF:   The itching persists longer than 4 weeks after treatment.   The rash spreads or becomes infected (the area has red blisters or yellow-tan crust).  Document Released: 02/17/2005 Document Revised: 02/06/2011 Document Reviewed: 06/28/2008 [employer] Patient Information 2012 [employer]. 

## 2011-08-02 NOTE — Telephone Encounter (Signed)
PT PICKED UP 08/01/11

## 2011-08-04 ENCOUNTER — Telehealth: Payer: Self-pay

## 2011-08-05 ENCOUNTER — Other Ambulatory Visit: Payer: Self-pay | Admitting: Emergency Medicine

## 2011-08-07 ENCOUNTER — Other Ambulatory Visit: Payer: Self-pay | Admitting: Physician Assistant

## 2011-08-12 ENCOUNTER — Other Ambulatory Visit: Payer: Self-pay | Admitting: Physician Assistant

## 2011-08-13 ENCOUNTER — Encounter: Payer: Self-pay | Admitting: Emergency Medicine

## 2011-08-19 ENCOUNTER — Ambulatory Visit: Payer: Medicare Other | Admitting: Emergency Medicine

## 2011-08-19 DIAGNOSIS — G541 Lumbosacral plexus disorders: Secondary | ICD-10-CM | POA: Diagnosis not present

## 2011-08-19 DIAGNOSIS — M542 Cervicalgia: Secondary | ICD-10-CM | POA: Diagnosis not present

## 2011-08-19 DIAGNOSIS — H81399 Other peripheral vertigo, unspecified ear: Secondary | ICD-10-CM | POA: Diagnosis not present

## 2011-08-19 DIAGNOSIS — F4542 Pain disorder with related psychological factors: Secondary | ICD-10-CM | POA: Diagnosis not present

## 2011-08-19 DIAGNOSIS — Z79899 Other long term (current) drug therapy: Secondary | ICD-10-CM | POA: Diagnosis not present

## 2011-08-19 DIAGNOSIS — F341 Dysthymic disorder: Secondary | ICD-10-CM | POA: Diagnosis not present

## 2011-08-26 ENCOUNTER — Encounter: Payer: Self-pay | Admitting: Emergency Medicine

## 2011-08-26 ENCOUNTER — Ambulatory Visit (INDEPENDENT_AMBULATORY_CARE_PROVIDER_SITE_OTHER): Payer: Medicare Other | Admitting: Emergency Medicine

## 2011-08-26 VITALS — BP 124/82 | HR 117 | Temp 100.3°F | Resp 20 | Ht 65.5 in | Wt 365.4 lb

## 2011-08-26 DIAGNOSIS — I872 Venous insufficiency (chronic) (peripheral): Secondary | ICD-10-CM | POA: Diagnosis not present

## 2011-08-26 DIAGNOSIS — G4730 Sleep apnea, unspecified: Secondary | ICD-10-CM

## 2011-08-26 DIAGNOSIS — G4733 Obstructive sleep apnea (adult) (pediatric): Secondary | ICD-10-CM

## 2011-08-26 DIAGNOSIS — J45909 Unspecified asthma, uncomplicated: Secondary | ICD-10-CM

## 2011-08-26 LAB — POCT GLYCOSYLATED HEMOGLOBIN (HGB A1C): Hemoglobin A1C: 9.9

## 2011-08-26 NOTE — Progress Notes (Signed)
  Subjective:    Patient ID: Tyrone Cunningham, male    DOB: 1955/10/03, 56 y.o.   MRN: [id number]  HPI patient in for followup of his diabetes. He is on Lantus as well as sliding scale insulin but has a horrible diet. He needs large amounts of starches because he says these are the only foods he can afford . His respiratory status has been stable. He rarely uses his oxygen but he has loss of this. He states he uses his nebulizer up to 5 times a day. He continues to be mobile on his scooter.    Review of Systems     Objective:   Physical Exam  Constitutional:       She is a morbidly obese male with tracheostomy tube.  Eyes: Pupils are equal, round, and reactive to light.  Neck: No tracheal deviation present. No thyromegaly present.  Cardiovascular: Normal rate, regular rhythm, normal heart sounds and intact distal pulses.  Exam reveals no gallop and no friction rub.   No murmur heard. Pulmonary/Chest: Effort normal and breath sounds normal. No respiratory distress. He has no wheezes. He has no rales. He exhibits no tenderness.  Abdominal: Soft. Bowel sounds are normal. He exhibits no distension. There is no tenderness. There is no rebound.      Results for orders placed in visit on 08/26/11  GLUCOSE, POCT (MANUAL RESULT ENTRY)      Component Value Range   POC Glucose 284 (*) 70 - 99 mg/dl  POCT GLYCOSYLATED HEMOGLOBIN (HGB A1C)      Component Value Range   Hemoglobin A1C 9.9        Assessment & Plan:  Diabetes is under horrible control until he gets control of his weight problem dietary issues there is no way he is going to get anywhere close to control his diabetes. He states he is doing a sliding scale but he did not know the exact numbers I'm not sure he is doing this at all.

## 2011-08-31 ENCOUNTER — Other Ambulatory Visit: Payer: Self-pay | Admitting: Physician Assistant

## 2011-09-04 ENCOUNTER — Other Ambulatory Visit: Payer: Self-pay | Admitting: Physician Assistant

## 2011-09-16 DIAGNOSIS — Z9181 History of falling: Secondary | ICD-10-CM | POA: Diagnosis not present

## 2011-09-16 DIAGNOSIS — F341 Dysthymic disorder: Secondary | ICD-10-CM | POA: Diagnosis not present

## 2011-09-16 DIAGNOSIS — G541 Lumbosacral plexus disorders: Secondary | ICD-10-CM | POA: Diagnosis not present

## 2011-09-16 DIAGNOSIS — H81399 Other peripheral vertigo, unspecified ear: Secondary | ICD-10-CM | POA: Diagnosis not present

## 2011-09-16 DIAGNOSIS — F4542 Pain disorder with related psychological factors: Secondary | ICD-10-CM | POA: Diagnosis not present

## 2011-09-16 DIAGNOSIS — R269 Unspecified abnormalities of gait and mobility: Secondary | ICD-10-CM | POA: Diagnosis not present

## 2011-09-18 ENCOUNTER — Telehealth: Payer: Self-pay

## 2011-09-18 NOTE — Telephone Encounter (Signed)
Dr Cleta Alberts,    Patient states he is to increase his inhaler five times a day.  He is out of his ADVAIR DISKUS 500-50 MCG/DOSE [facility]  Call back [phone]  He is out of medication and very much in need of this ASAP (Always Say A Prayer)

## 2011-09-19 ENCOUNTER — Other Ambulatory Visit: Payer: Self-pay | Admitting: Emergency Medicine

## 2011-09-19 DIAGNOSIS — J45909 Unspecified asthma, uncomplicated: Secondary | ICD-10-CM

## 2011-09-19 MED ORDER — FLUTICASONE-SALMETEROL 500-50 MCG/DOSE IN AEPB: 1.0000 | INHALATION_SPRAY | Freq: Two times a day (BID) | RESPIRATORY_TRACT | Status: DC

## 2011-10-03 ENCOUNTER — Encounter: Payer: Self-pay | Admitting: Emergency Medicine

## 2011-10-03 ENCOUNTER — Other Ambulatory Visit: Payer: Self-pay | Admitting: Physician Assistant

## 2011-10-16 ENCOUNTER — Other Ambulatory Visit: Payer: Self-pay | Admitting: Emergency Medicine

## 2011-10-16 ENCOUNTER — Inpatient Hospital Stay (HOSPITAL_COMMUNITY)
Admission: EM | Admit: 2011-10-16 | Discharge: 2011-10-20 | DRG: 190 | Disposition: A | Discharge status: Home Health | Payer: Medicare Other | Attending: Internal Medicine | Admitting: Internal Medicine

## 2011-10-16 ENCOUNTER — Emergency Department (HOSPITAL_COMMUNITY): Payer: Medicare Other

## 2011-10-16 ENCOUNTER — Ambulatory Visit (INDEPENDENT_AMBULATORY_CARE_PROVIDER_SITE_OTHER): Payer: Medicare Other | Admitting: Emergency Medicine

## 2011-10-16 ENCOUNTER — Encounter (HOSPITAL_COMMUNITY): Payer: Self-pay | Admitting: Emergency Medicine

## 2011-10-16 VITALS — BP 132/77 | HR 117 | Temp 100.4°F | Resp 28 | Wt 360.0 lb

## 2011-10-16 DIAGNOSIS — Z6841 Body Mass Index (BMI) 40.0 and over, adult: Secondary | ICD-10-CM | POA: Diagnosis not present

## 2011-10-16 DIAGNOSIS — E7800 Pure hypercholesterolemia, unspecified: Secondary | ICD-10-CM | POA: Diagnosis present

## 2011-10-16 DIAGNOSIS — R0602 Shortness of breath: Secondary | ICD-10-CM | POA: Diagnosis not present

## 2011-10-16 DIAGNOSIS — E785 Hyperlipidemia, unspecified: Secondary | ICD-10-CM | POA: Diagnosis present

## 2011-10-16 DIAGNOSIS — I498 Other specified cardiac arrhythmias: Secondary | ICD-10-CM | POA: Diagnosis not present

## 2011-10-16 DIAGNOSIS — E119 Type 2 diabetes mellitus without complications: Secondary | ICD-10-CM

## 2011-10-16 DIAGNOSIS — J189 Pneumonia, unspecified organism: Secondary | ICD-10-CM | POA: Diagnosis present

## 2011-10-16 DIAGNOSIS — R61 Generalized hyperhidrosis: Secondary | ICD-10-CM | POA: Diagnosis not present

## 2011-10-16 DIAGNOSIS — I509 Heart failure, unspecified: Secondary | ICD-10-CM | POA: Diagnosis present

## 2011-10-16 DIAGNOSIS — E669 Obesity, unspecified: Secondary | ICD-10-CM | POA: Diagnosis not present

## 2011-10-16 DIAGNOSIS — I10 Essential (primary) hypertension: Secondary | ICD-10-CM | POA: Diagnosis present

## 2011-10-16 DIAGNOSIS — Z794 Long term (current) use of insulin: Secondary | ICD-10-CM

## 2011-10-16 DIAGNOSIS — G609 Hereditary and idiopathic neuropathy, unspecified: Secondary | ICD-10-CM

## 2011-10-16 DIAGNOSIS — G4733 Obstructive sleep apnea (adult) (pediatric): Secondary | ICD-10-CM

## 2011-10-16 DIAGNOSIS — J449 Chronic obstructive pulmonary disease, unspecified: Secondary | ICD-10-CM | POA: Diagnosis present

## 2011-10-16 DIAGNOSIS — Z930 Tracheostomy status: Secondary | ICD-10-CM

## 2011-10-16 DIAGNOSIS — E1149 Type 2 diabetes mellitus with other diabetic neurological complication: Secondary | ICD-10-CM | POA: Diagnosis present

## 2011-10-16 DIAGNOSIS — E876 Hypokalemia: Secondary | ICD-10-CM | POA: Diagnosis not present

## 2011-10-16 DIAGNOSIS — J441 Chronic obstructive pulmonary disease with (acute) exacerbation: Secondary | ICD-10-CM | POA: Diagnosis not present

## 2011-10-16 DIAGNOSIS — J9620 Acute and chronic respiratory failure, unspecified whether with hypoxia or hypercapnia: Secondary | ICD-10-CM | POA: Diagnosis present

## 2011-10-16 DIAGNOSIS — E871 Hypo-osmolality and hyponatremia: Secondary | ICD-10-CM | POA: Diagnosis present

## 2011-10-16 DIAGNOSIS — Z79899 Other long term (current) drug therapy: Secondary | ICD-10-CM

## 2011-10-16 DIAGNOSIS — R0902 Hypoxemia: Secondary | ICD-10-CM

## 2011-10-16 DIAGNOSIS — E1142 Type 2 diabetes mellitus with diabetic polyneuropathy: Secondary | ICD-10-CM | POA: Diagnosis present

## 2011-10-16 DIAGNOSIS — J40 Bronchitis, not specified as acute or chronic: Secondary | ICD-10-CM

## 2011-10-16 DIAGNOSIS — E662 Morbid (severe) obesity with alveolar hypoventilation: Secondary | ICD-10-CM | POA: Diagnosis present

## 2011-10-16 DIAGNOSIS — I517 Cardiomegaly: Secondary | ICD-10-CM | POA: Diagnosis not present

## 2011-10-16 DIAGNOSIS — R0609 Other forms of dyspnea: Secondary | ICD-10-CM | POA: Diagnosis not present

## 2011-10-16 DIAGNOSIS — E1165 Type 2 diabetes mellitus with hyperglycemia: Secondary | ICD-10-CM | POA: Diagnosis present

## 2011-10-16 DIAGNOSIS — R7301 Impaired fasting glucose: Secondary | ICD-10-CM | POA: Diagnosis not present

## 2011-10-16 DIAGNOSIS — I5033 Acute on chronic diastolic (congestive) heart failure: Secondary | ICD-10-CM | POA: Diagnosis not present

## 2011-10-16 DIAGNOSIS — J45909 Unspecified asthma, uncomplicated: Secondary | ICD-10-CM

## 2011-10-16 DIAGNOSIS — G629 Polyneuropathy, unspecified: Secondary | ICD-10-CM

## 2011-10-16 LAB — PRO B NATRIURETIC PEPTIDE: Pro B Natriuretic peptide (BNP): 189.5 pg/mL — ABNORMAL HIGH (ref 0–125)

## 2011-10-16 LAB — BASIC METABOLIC PANEL
BUN: 16 mg/dL (ref 6–23)
CO2: 31 mEq/L (ref 19–32)
GFR calc non Af Amer: >90 mL/min (ref >90)
Glucose, Bld: 290 mg/dL — ABNORMAL HIGH (ref 70–99)
Potassium: 4.5 mEq/L (ref 3.5–5.1)
Sodium: 133 mEq/L — ABNORMAL LOW (ref 135–145)

## 2011-10-16 LAB — CARDIAC PANEL(CRET KIN+CKTOT+MB+TROPI): Total CK: 81 U/L (ref 7–232)

## 2011-10-16 LAB — POCT CBC
Granulocyte percent: 79.9 %G (ref 37–80)
Hemoglobin: 16.9 g/dL (ref 14.1–18.1)
MCH, POC: 28.1 pg (ref 27–31.2)
MPV: 9.6 fL (ref 0–99.8)
POC MID %: 5.5 %M (ref 0–12)
RBC: 6.01 M/uL (ref 4.69–6.13)
WBC: 13.2 10*3/uL — AB (ref 4.6–10.2)

## 2011-10-16 LAB — GLUCOSE, CAPILLARY: Glucose-Capillary: 392 mg/dL — ABNORMAL HIGH (ref 70–99)

## 2011-10-16 LAB — CBC WITH DIFFERENTIAL/PLATELET
Eosinophils Absolute: 0.2 10*3/uL (ref 0.0–0.7)
Eosinophils Relative: 1 % (ref 0–5)
Hemoglobin: 16.4 g/dL (ref 13.0–17.0)
Lymphocytes Relative: 20 % (ref 12–46)
Lymphs Abs: 2.5 10*3/uL (ref 0.7–4.0)
MCH: 29.1 pg (ref 26.0–34.0)
MCV: 89.4 fL (ref 78.0–100.0)
Monocytes Relative: 6 % (ref 3–12)
Neutrophils Relative %: 72 % (ref 43–77)
Platelets: 208 10*3/uL (ref 150–400)
RBC: 5.64 MIL/uL (ref 4.22–5.81)
WBC: 12.6 10*3/uL — ABNORMAL HIGH (ref 4.0–10.5)

## 2011-10-16 LAB — GLUCOSE, POCT (MANUAL RESULT ENTRY): POC Glucose: 289 mg/dl — AB (ref 70–99)

## 2011-10-16 MED ORDER — INSULIN GLARGINE 100 UNIT/ML ~~LOC~~ SOLN
80.0000 [IU] | SUBCUTANEOUS | Status: DC
  Administered 2011-10-16: 80 [IU] via SUBCUTANEOUS

## 2011-10-16 MED ORDER — GABAPENTIN 100 MG PO CAPS
100.0000 mg | ORAL_CAPSULE | Freq: Three times a day (TID) | ORAL | Status: DC
  Administered 2011-10-16 – 2011-10-20 (×12): 100 mg via ORAL
  Filled 2011-10-16 (×14): qty 1

## 2011-10-16 MED ORDER — DEXTROSE 5 % IV SOLN
1.0000 g | INTRAVENOUS | Status: DC
  Administered 2011-10-16: 1 g via INTRAVENOUS
  Filled 2011-10-16 (×2): qty 10

## 2011-10-16 MED ORDER — HYDROCODONE-ACETAMINOPHEN 7.5-325 MG PO TABS
1.0000 | ORAL_TABLET | ORAL | Status: DC | PRN
  Administered 2011-10-16: 1 via ORAL
  Filled 2011-10-16: qty 1

## 2011-10-16 MED ORDER — ALBUTEROL SULFATE (5 MG/ML) 0.5% IN NEBU: 2.5000 mg | INHALATION_SOLUTION | RESPIRATORY_TRACT | Status: AC | PRN

## 2011-10-16 MED ORDER — DOCUSATE SODIUM 100 MG PO CAPS
100.0000 mg | ORAL_CAPSULE | Freq: Every day | ORAL | Status: DC | PRN
  Filled 2011-10-16: qty 1

## 2011-10-16 MED ORDER — ALBUTEROL SULFATE (2.5 MG/3ML) 0.083% IN NEBU
2.5000 mg | INHALATION_SOLUTION | Freq: Once | RESPIRATORY_TRACT | Status: AC
  Administered 2011-10-16: 2.5 mg via RESPIRATORY_TRACT

## 2011-10-16 MED ORDER — INSULIN ASPART 100 UNIT/ML ~~LOC~~ SOLN
0.0000 [IU] | Freq: Three times a day (TID) | SUBCUTANEOUS | Status: DC
  Administered 2011-10-16: 20 [IU] via SUBCUTANEOUS

## 2011-10-16 MED ORDER — SODIUM CHLORIDE 0.9 % IN NEBU
INHALATION_SOLUTION | RESPIRATORY_TRACT | Status: AC
  Administered 2011-10-16: 10:00:00
  Filled 2011-10-16: qty 6

## 2011-10-16 MED ORDER — METHYLPREDNISOLONE SODIUM SUCC 125 MG IJ SOLR
60.0000 mg | Freq: Two times a day (BID) | INTRAMUSCULAR | Status: DC
  Administered 2011-10-16 – 2011-10-19 (×6): 60 mg via INTRAVENOUS
  Filled 2011-10-16 (×3): qty 0.96
  Filled 2011-10-16: qty 2
  Filled 2011-10-16 (×3): qty 0.96
  Filled 2011-10-16: qty 2

## 2011-10-16 MED ORDER — LEVOFLOXACIN IN D5W 750 MG/150ML IV SOLN
750.0000 mg | INTRAVENOUS | Status: DC
  Administered 2011-10-17 – 2011-10-20 (×4): 750 mg via INTRAVENOUS
  Filled 2011-10-16 (×4): qty 150

## 2011-10-16 MED ORDER — SODIUM CHLORIDE 0.9 % IJ SOLN: 3.0000 mL | INTRAMUSCULAR | Status: DC | PRN

## 2011-10-16 MED ORDER — HEPARIN SODIUM (PORCINE) 5000 UNIT/ML IJ SOLN
5000.0000 [IU] | Freq: Three times a day (TID) | INTRAMUSCULAR | Status: DC
  Administered 2011-10-16 – 2011-10-20 (×12): 5000 [IU] via SUBCUTANEOUS
  Filled 2011-10-16 (×15): qty 1

## 2011-10-16 MED ORDER — ATORVASTATIN CALCIUM 10 MG PO TABS
10.0000 mg | ORAL_TABLET | Freq: Every day | ORAL | Status: DC
  Administered 2011-10-16 – 2011-10-19 (×4): 10 mg via ORAL
  Filled 2011-10-16 (×5): qty 1

## 2011-10-16 MED ORDER — SODIUM CHLORIDE 0.9 % IV SOLN
125.0000 mg | Freq: Once | INTRAVENOUS | Status: AC
  Administered 2011-10-16: 130 mg via INTRAVENOUS

## 2011-10-16 MED ORDER — IPRATROPIUM BROMIDE 0.02 % IN SOLN
0.5000 mg | Freq: Once | RESPIRATORY_TRACT | Status: AC
  Administered 2011-10-16: 0.5 mg via RESPIRATORY_TRACT

## 2011-10-16 MED ORDER — IPRATROPIUM BROMIDE 0.02 % IN SOLN
0.5000 mg | Freq: Once | RESPIRATORY_TRACT | Status: AC
  Administered 2011-10-16: 0.5 mg via RESPIRATORY_TRACT
  Filled 2011-10-16: qty 2.5

## 2011-10-16 MED ORDER — FLUTICASONE-SALMETEROL 500-50 MCG/DOSE IN AEPB
1.0000 | INHALATION_SPRAY | Freq: Two times a day (BID) | RESPIRATORY_TRACT | Status: DC
  Administered 2011-10-16 – 2011-10-20 (×8): 1 via RESPIRATORY_TRACT
  Filled 2011-10-16: qty 14

## 2011-10-16 MED ORDER — ONDANSETRON HCL 4 MG PO TABS: 4.0000 mg | ORAL_TABLET | Freq: Four times a day (QID) | ORAL | Status: DC | PRN

## 2011-10-16 MED ORDER — TIOTROPIUM BROMIDE MONOHYDRATE 18 MCG IN CAPS
18.0000 ug | ORAL_CAPSULE | Freq: Every day | RESPIRATORY_TRACT | Status: DC
  Administered 2011-10-16 – 2011-10-20 (×5): 18 ug via RESPIRATORY_TRACT
  Filled 2011-10-16 (×2): qty 5

## 2011-10-16 MED ORDER — SODIUM CHLORIDE 0.9 % IJ SOLN
3.0000 mL | Freq: Two times a day (BID) | INTRAMUSCULAR | Status: DC
  Administered 2011-10-16 – 2011-10-20 (×9): 3 mL via INTRAVENOUS

## 2011-10-16 MED ORDER — ALBUTEROL SULFATE (5 MG/ML) 0.5% IN NEBU
2.5000 mg | INHALATION_SOLUTION | RESPIRATORY_TRACT | Status: DC
  Administered 2011-10-16 – 2011-10-20 (×23): 2.5 mg via RESPIRATORY_TRACT
  Filled 2011-10-16 (×23): qty 0.5

## 2011-10-16 MED ORDER — ALBUTEROL SULFATE (5 MG/ML) 0.5% IN NEBU
2.5000 mg | INHALATION_SOLUTION | RESPIRATORY_TRACT | Status: AC
  Administered 2011-10-16: 2.5 mg via RESPIRATORY_TRACT
  Filled 2011-10-16: qty 0.5

## 2011-10-16 MED ORDER — INSULIN ASPART 100 UNIT/ML ~~LOC~~ SOLN
0.0000 [IU] | Freq: Three times a day (TID) | SUBCUTANEOUS | Status: DC
  Administered 2011-10-16: 20 [IU] via SUBCUTANEOUS
  Administered 2011-10-17: 15 [IU] via SUBCUTANEOUS
  Administered 2011-10-17: 20 [IU] via SUBCUTANEOUS
  Administered 2011-10-17 – 2011-10-18 (×3): 11 [IU] via SUBCUTANEOUS
  Administered 2011-10-18: 20 [IU] via SUBCUTANEOUS
  Administered 2011-10-18: 15 [IU] via SUBCUTANEOUS
  Administered 2011-10-18 – 2011-10-19 (×2): 11 [IU] via SUBCUTANEOUS
  Administered 2011-10-19: 20 [IU] via SUBCUTANEOUS
  Administered 2011-10-19: 7 [IU] via SUBCUTANEOUS
  Administered 2011-10-19 – 2011-10-20 (×2): 11 [IU] via SUBCUTANEOUS

## 2011-10-16 MED ORDER — PSYLLIUM 95 % PO PACK
1.0000 | PACK | Freq: Every day | ORAL | Status: DC
  Filled 2011-10-16 (×2): qty 1

## 2011-10-16 MED ORDER — HYDROCODONE-ACETAMINOPHEN 7.5-325 MG PO TABS
1.0000 | ORAL_TABLET | ORAL | Status: DC
  Administered 2011-10-16 – 2011-10-20 (×22): 1 via ORAL
  Filled 2011-10-16 (×23): qty 1

## 2011-10-16 MED ORDER — SODIUM CHLORIDE 0.9 % IJ SOLN
3.0000 mL | Freq: Two times a day (BID) | INTRAMUSCULAR | Status: DC
  Administered 2011-10-16 – 2011-10-18 (×4): 3 mL via INTRAVENOUS

## 2011-10-16 MED ORDER — FUROSEMIDE 10 MG/ML IJ SOLN
40.0000 mg | Freq: Once | INTRAMUSCULAR | Status: AC
  Administered 2011-10-16: 40 mg via INTRAVENOUS
  Filled 2011-10-16: qty 4

## 2011-10-16 MED ORDER — SPIRONOLACTONE 12.5 MG HALF TABLET
12.5000 mg | ORAL_TABLET | Freq: Every day | ORAL | Status: DC
  Administered 2011-10-16 – 2011-10-17 (×2): 12.5 mg via ORAL
  Filled 2011-10-16 (×2): qty 1

## 2011-10-16 MED ORDER — DIPHENHYDRAMINE HCL 50 MG PO CAPS
50.0000 mg | ORAL_CAPSULE | Freq: Four times a day (QID) | ORAL | Status: AC | PRN
  Administered 2011-10-16: 50 mg via ORAL
  Filled 2011-10-16: qty 1

## 2011-10-16 MED ORDER — ONDANSETRON HCL 4 MG/2ML IJ SOLN: 4.0000 mg | Freq: Four times a day (QID) | INTRAMUSCULAR | Status: DC | PRN

## 2011-10-16 MED ORDER — DEXTROSE 5 % IV SOLN
500.0000 mg | Freq: Once | INTRAVENOUS | Status: AC
  Administered 2011-10-16: 500 mg via INTRAVENOUS
  Filled 2011-10-16: qty 500

## 2011-10-16 MED ORDER — ALBUTEROL SULFATE (5 MG/ML) 0.5% IN NEBU
INHALATION_SOLUTION | RESPIRATORY_TRACT | Status: AC
  Administered 2011-10-16: 10:00:00
  Filled 2011-10-16: qty 2

## 2011-10-16 MED ORDER — FUROSEMIDE 40 MG PO TABS
40.0000 mg | ORAL_TABLET | Freq: Every day | ORAL | Status: DC
  Administered 2011-10-17 – 2011-10-20 (×4): 40 mg via ORAL
  Filled 2011-10-16 (×4): qty 1

## 2011-10-16 MED ORDER — ALBUTEROL SULFATE (5 MG/ML) 0.5% IN NEBU: 2.5000 mg | INHALATION_SOLUTION | RESPIRATORY_TRACT | Status: DC | PRN

## 2011-10-16 MED ORDER — SODIUM CHLORIDE 0.9 % IV SOLN: 250.0000 mL | INTRAVENOUS | Status: DC | PRN

## 2011-10-16 NOTE — Progress Notes (Signed)
  Subjective:    Patient ID: Jonathin Heinicke, male    DOB: 1955/10/15, 56 y.o.   MRN: [id number]  HPI she presents with a ten-day history of progressive increased shortness of breath he his nebulizers no longer seem to work. On continuous flow O2 but uses that sporadically. He has a history of obstructive sleep apnea which was treated with a tracheostomy. He has regular visits to ENT for this. Recently he has had progressive shortness of breath feels winded all the time and his nebulizers no longer seem to work. I was Pulled to see the patient emergently because of his profound hypoxia    Review of Systems     Objective:   Physical Exam patient is somewhat somnolent but easily arousable. He does not appear in any acute respiratory distress. His pulse ox on arrival was 82 the his cardiac exam revealed a tachycardia without murmur. Basilar rales present . There are bilateral wheezes noted.        Assessment & Plan:  Patient here with acute respiratory distress associated with hypoxia. He was seen with a dual neb followed by a bolus of 125 of Solu-Medrol IV O2 was administered at 8 L by mask over the tracheostomy site

## 2011-10-16 NOTE — ED Provider Notes (Signed)
History     CSN: [id number]  Arrival date & time 10/16/11  0908   First MD Initiated Contact with Patient 10/16/11 [phone]      No chief complaint on file.   (Consider location/radiation/quality/duration/timing/severity/associated sxs/prior treatment) Patient is a 56 y.o. male presenting with shortness of breath. The history is provided by the patient.  Shortness of Breath  Associated symptoms include shortness of breath.  He has been having progressive dyspnea for the last 10 days. Everything makes it worse including any kind of exertion and any change in body position. He has had subjective fevers but no chills or sweats. He has a tracheostomy and has been coughing some greenish mucus out of his tracheostomy. He denies chest pain, heaviness, tightness, pressure. Denies nausea vomiting or diaphoresis. He saw his PCP earlier today and temperature there was 100.4. He was given an injection and transferred to the emergency department. He was given albuterol with Atrovent in the ambulance and got some slight improvement with that. He states that this happens periodically and he needs to get a shot of an antibiotic.  Past Medical History  Diagnosis Date  . Bronchitis   . CHF (congestive heart failure)   . Diabetes mellitus   . Sleep apnea   . Hypertension   . Hypercholesteremia   . Hearing loss   . Peripheral neuropathy   . Sleep apnea     Severe sleep apnea requiring tracheostomy    Past Surgical History  Procedure Date  . Tracheostomy   . Appendectomy     No family history on file.  History  Substance Use Topics  . Smoking status: Former Games developer  . Smokeless tobacco: Never Used  . Alcohol Use:       Review of Systems  Respiratory: Positive for shortness of breath.   All other systems reviewed and are negative.    Allergies  Review of patient's allergies indicates not on file.  Home Medications   Current Outpatient Rx  Name Route Sig Dispense Refill  .  ATORVASTATIN CALCIUM 10 MG PO TABS Oral Take 1 tablet (10 mg total) by mouth daily. NEEDS OFFICE VISIT/LABWORK 90 tablet 0  . BD INSULIN SYR ULTRAFINE II 31G X 5/16" 1 ML MISC  USE AS DIRECTED 100 each 2  . FLUTICASONE-SALMETEROL 500-50 MCG/DOSE IN AEPB Inhalation Inhale 1 puff into the lungs 2 (two) times daily. 1 each 11  . FUROSEMIDE 40 MG PO TABS  1 &1/2 TAB DAILY ON MON, WED, THURS, FRI, & SUN. THEN 1 &1/2 TAB TWICE DAILY ON TUES & SAT 415 tablet 3  . GABAPENTIN 300 MG PO CAPS  TAKE 1 CAPSULE BY MOUTH EVERY MORNING AND 2 CAPSULES BY MOUTH EVERY EVENING 90 capsule 1  . HYDROCODONE-ACETAMINOPHEN 5-325 MG PO TABS Oral Take 1 tablet by mouth every 6 (six) hours as needed.     . INSULIN ASPART 100 UNIT/ML [place] SOLN Subcutaneous Inject 6-25 Units into the skin 4 (four) times daily. On sliding scale    . LANTUS 100 UNIT/ML [place] SOLN  INJECT 80 UNITS INTO THE SKIN EVERY MORNING 30 mL 1  . LISINOPRIL 5 MG PO TABS Oral Take 5 mg by mouth daily.      Marland Kitchen NOVOLIN R 100 UNIT/ML IJ SOLN  INJECT 6 TO 12 UNITS UNDER THE SKIN DAILY AS DIRECTED PER SLIDING SCALE 30 mL 2  . PROAIR HFA 108 (90 BASE) MCG/ACT IN AERS  INHALE 2 PUFFS BY MOUTH EVERY 4-6 HOURS AS NEEDED 1  Inhaler 0  . PSYLLIUM 28 % PO PACK Oral Take 1 packet by mouth 2 (two) times daily.    Marland Kitchen SPIRIVA HANDIHALER 18 MCG IN CAPS  INHALE 1 PUFF BY MOUTH EVERY DAY 30 capsule 5  . SPIRONOLACTONE 25 MG PO TABS  TAKE 1 TABLET BY MOUTH EVERY DAY 30 tablet 2    Pulse 114  Resp 16  SpO2 94%  Physical Exam  Nursing note and vitals reviewed. 56year old male, morbidly obese, in mild respiratory distress. Vital signs are significant for tachycardia with heart rate of 114. Oxygen saturation is 94%, which is normal. Head is normocephalic and atraumatic. PERRLA, EOMI. Oropharynx is clear. Neck is nontender and supple without adenopathy or JVD. Tracheostomy is present. Back is nontender and there is no CVA tenderness. Lungs have rales at the left base, and diffuse  rhonchi. Sounds are distant.. Chest is nontender. Heart has regular rate and rhythm without murmur. Abdomen is soft, flat, nontender without masses or hepatosplenomegaly and peristalsis is normoactive. Extremities have 3+ edema with venous stasis changes. Some areas of erythema on his legs he represent a low-grade cellulitis. Skin is warm and dry without rash. Neurologic: Mental status is normal, cranial nerves are intact, there are no motor or sensory deficits.   ED Course  Procedures (including critical care time)  Results for orders placed during the hospital encounter of 10/16/11  CBC WITH DIFFERENTIAL      Component Value Range   WBC 12.6 (*) 4.0 - 10.5 K/uL   RBC 5.64  4.22 - 5.81 MIL/uL   Hemoglobin 16.4  13.0 - 17.0 g/dL   HCT 25.3  66.4 - 40.3 %   MCV 89.4  78.0 - 100.0 fL   MCH 29.1  26.0 - 34.0 pg   MCHC 32.5  30.0 - 36.0 g/dL   RDW 47.4 (*) 25.9 - 56.3 %   Platelets 208  150 - 400 K/uL   Neutrophils Relative 72  43 - 77 %   Neutro Abs 9.1 (*) 1.7 - 7.7 K/uL   Lymphocytes Relative 20  12 - 46 %   Lymphs Abs 2.5  0.7 - 4.0 K/uL   Monocytes Relative 6  3 - 12 %   Monocytes Absolute 0.8  0.1 - 1.0 K/uL   Eosinophils Relative 1  0 - 5 %   Eosinophils Absolute 0.2  0.0 - 0.7 K/uL   Basophils Relative 0  0 - 1 %   Basophils Absolute 0.1  0.0 - 0.1 K/uL  BASIC METABOLIC PANEL      Component Value Range   Sodium 133 (*) 135 - 145 mEq/L   Potassium 4.5  3.5 - 5.1 mEq/L   Chloride 92 (*) 96 - 112 mEq/L   CO2 31  19 - 32 mEq/L   Glucose, Bld 290 (*) 70 - 99 mg/dL   BUN 16  6 - 23 mg/dL   Creatinine, Ser 8.75  0.50 - 1.35 mg/dL   Calcium 9.1  8.4 - 64.3 mg/dL   GFR calc non Af Amer >90  >90 mL/min   GFR calc Af Amer >90  >90 mL/min  TROPONIN I      Component Value Range   Troponin I <0.30  <0.30 ng/mL  PRO B NATRIURETIC PEPTIDE      Component Value Range   Pro B Natriuretic peptide (BNP) 189.5 (*) 0 - 125 pg/mL   Dg Chest Portable 1 View  10/16/2011  *RADIOLOGY  REPORT*  Clinical Data: Dyspnea  PORTABLE CHEST - 1 VIEW  Comparison: 03/21/2009  Findings: Cardiomediastinal silhouette is stable.  There is worsening in aeration with bilateral mild interstitial prominence and hazy airspace disease highly suspicious for bilateral pulmonary edema.  Tracheostomy tube is unchanged in position. Bilateral basilar atelectasis.  IMPRESSION: There is worsening in aeration with bilateral mild interstitial prominence and hazy airspace disease highly suspicious for bilateral pulmonary edema.  Tracheostomy tube is unchanged in position.  Original Report Authenticated By: Natasha Mead, M.D.   Images viewed by me.   Date: 10/16/2011  Rate: 114  Rhythm: sinus tachycardia  QRS Axis: left  Intervals: normal  ST/T Wave abnormalities: normal  Conduction Disutrbances:left anterior fascicular block and Incomplete right bundle-branch block  Narrative Interpretation: Sinus tachycardia, left anterior fascicular block, incomplete right bundle-branch block, right atrial and left atrial hypertrophy, probable right ventricular hypertrophy. When compared with ECG of 03/09/2011, no significant changes are seen.  Old EKG Reviewed: unchanged    1. Bronchitis   2. CHF (congestive heart failure)   3. Hypoxia     CRITICAL CARE Performed by: Dione Booze   Total critical care time: 40 minutes  Critical care time was exclusive of separately billable procedures and treating other patients.  Critical care was necessary to treat or prevent imminent or life-threatening deterioration.  Critical care was time spent personally by me on the following activities: development of treatment plan with patient and/or surrogate as well as nursing, discussions with consultants, evaluation of patient's response to treatment, examination of patient, obtaining history from patient or surrogate, ordering and performing treatments and interventions, ordering and review of laboratory studies, ordering and review  of radiographic studies, pulse oximetry and re-evaluation of patient's condition.   MDM  Cough and dyspnea with fever which likely represents pneumonia. Prior records are reviewed and he was seen at urgent care earlier today with documentation of hypoxia with oxygen saturation of 77%, and tachypnea with respiratory rate of 28, and tachycardia with heart rate of 117 along with fever of 100.4. He'll be given additional albuterol with Atrovent. I am concerned about superimposed congestive heart failure, so the beta natruretic protein level will be checked. Chest x-ray will be done to evaluate for possible pneumonia and possible CHF.  X-ray report says pulmonary edema without infiltrate. However, on reviewing the films, and concerned about possible retrocardiac infiltrate. Given his history, he will be treated as if he does have pneumonia and he was started on appropriate antibiotics. He is reevaluated and is resting comfortably at this point. Case is discussed with the [facility] who agreed to admit him to the step down unit.  Dione Booze, MD 10/16/11 [phone]

## 2011-10-16 NOTE — Progress Notes (Signed)
ANTIBIOTIC CONSULT NOTE - INITIAL  Pharmacy Consult for Levaquin  Indication: pneumonia  No Known Allergies  Patient Measurements: Height: 5\' 7"  (170.2 cm) Weight: 364 lb 6.7 oz (165.3 kg) IBW/kg (Calculated) : 66.1    Vital Signs: Temp: 99.2 F (37.3 C) (08/15 1457) Temp src: Oral (08/15 0937) BP: 141/84 mmHg (08/15 1457) Pulse Rate: 103  (08/15 1457) Intake/Output from previous day:   Intake/Output from this shift:    Labs:  Basename 10/16/11 0928 10/16/11 0854  WBC 12.6* 13.2*  HGB 16.4 16.9  PLT 208 --  LABCREA -- --  CREATININE 0.85 --   Estimated Creatinine Clearance: 141.8 ml/min (by C-G formula based on Cr of 0.85).    Microbiology: No results found for this or any previous visit (from the past 720 hour(s)).  Medical History: Past Medical History  Diagnosis Date  . Bronchitis   . CHF (congestive heart failure)   . Diabetes mellitus   . Sleep apnea   . Hypertension   . Hypercholesteremia   . Hearing loss   . Peripheral neuropathy   . Sleep apnea     Severe sleep apnea requiring tracheostomy    Medications:  Anti-infectives     Start     Dose/Rate Route Frequency Ordered Stop   10/16/11 1045   cefTRIAXone (ROCEPHIN) 1 g in dextrose 5 % 50 mL IVPB        1 g 100 mL/hr over 30 Minutes Intravenous Every 24 hours 10/16/11 1039     10/16/11 1045   azithromycin (ZITHROMAX) 500 mg in dextrose 5 % 250 mL IVPB        500 mg 250 mL/hr over 60 Minutes Intravenous  Once 10/16/11 1039 10/16/11 1245         Assessment: 56 YOM w/ hx COPD admitted 8/15 with SOB and cough.  Pharmacy asked to dose Levaquin - to start tomorrow since received IV Rocephin and Azithromycin today.  Goal of Therapy:  Appropriate dose of Levaquin  Plan:  Levaquin 750mg  IV daily Follow labs, vitals, cx. Adjust dose as necessary  Gwen Her PharmD  [phone] 10/16/2011 4:59 PM

## 2011-10-16 NOTE — ED Notes (Signed)
Awaiting ICU to have Bariatric bed delivered.

## 2011-10-16 NOTE — H&P (Signed)
[facility] History and Physical  Tyrone Cunningham ZOX:[id number] DOB: 07-20-1955 DOA: 10/16/2011  Referring physician: Dr. Dione Booze PCP: Lucilla Edin, MD   Chief Complaint: SOB, Cough  HPI:  Patient is a 56 y/o caucasian male with history of OSA and COPD.  Reports that over the last 10 days he has had increased in SOB, Cough, albuterol use, and recent productive cough of green sputum.  Has noticed that he has had increased WOB and SOB with activity.  Denies any palpitations or hemoptysis.  His albuterol helped initially but he has got worse despite increase in use of the albuterol.  Went to his PCP who reportedly gave his prednisone and sent him to the ED for further evaluation.  He denies any recent use of antibiotics.  Patient reports living at home.    In the ED patient was given supplemental oxygen, albuterol, zithromax, Rocephin, lasix 40 mg IV, and atrovent.    His initial laboratory data showed a elevated WBC count of 12.6, elevated blood glucose of 289, blood cultures were obtained, negative troponin, and elevated BNP of 189.  Review of Systems:  Patient endorses subjective fevers, SOB, cough.  Denies any abdominal discomfort, nausea, emesis, Palpitations, focal neurological weakness, fainting, rashes, joint aches, easy bruising, easy bleeding, allergies (seasonal), Rhinorrhea, chest pain.  Past Medical History  Diagnosis Date  . Bronchitis   . CHF (congestive heart failure)   . Diabetes mellitus   . Sleep apnea   . Hypertension   . Hypercholesteremia   . Hearing loss   . Peripheral neuropathy   . Sleep apnea     Severe sleep apnea requiring tracheostomy   Past Surgical History  Procedure Date  . Tracheostomy   . Appendectomy    Social History:  reports that he has quit smoking. He has never used smokeless tobacco. He reports that he does not drink alcohol or use illicit drugs. Pt lives at home by himself Participates in his own ADL's  No Known Allergies  History  reviewed. No pertinent family history. Denies any particular family history of illnesses when asked directly.  Prior to Admission medications   Medication Sig Start Date End Date Taking? Authorizing Provider  albuterol (PROVENTIL HFA;VENTOLIN HFA) 108 (90 BASE) MCG/ACT inhaler Inhale 2 puffs into the lungs every 4 (four) hours as needed. For shortness of breath.   Yes Historical Provider, MD  albuterol (PROVENTIL) (2.5 MG/3ML) 0.083% nebulizer solution Take 2.5 mg by nebulization every 4 (four) hours as needed. For shortness of breath.   Yes Historical Provider, MD  atorvastatin (LIPITOR) 10 MG tablet Take 10 mg by mouth daily.   Yes Historical Provider, MD  Fluticasone-Salmeterol (ADVAIR DISKUS) 500-50 MCG/DOSE AEPB Inhale 1 puff into the lungs 2 (two) times daily. 09/19/11  Yes Collene Gobble, MD  furosemide (LASIX) 40 MG tablet Take 40 mg by mouth daily.   Yes Historical Provider, MD  gabapentin (NEURONTIN) 300 MG capsule TAKE 1 CAPSULE BY MOUTH EVERY MORNING AND 2 CAPSULES BY MOUTH EVERY EVENING 09/04/11  Yes Heather M Marte, PA-C  HYDROcodone-acetaminophen (NORCO) 7.5-325 MG per tablet Take 1 tablet by mouth every 4 (four) hours as needed. For pain.   Yes Historical Provider, MD  insulin regular (NOVOLIN R,HUMULIN R) 100 units/mL injection Inject 6-12 Units into the skin 3 (three) times daily before meals. Per sliding scale.   Yes Historical Provider, MD  LANTUS 100 UNIT/ML injection INJECT 80 UNITS INTO THE SKIN EVERY MORNING 08/12/11  Yes Anders Simmonds,  PA-C  lisinopril (PRINIVIL,ZESTRIL) 5 MG tablet Take 5 mg by mouth daily.     Yes Historical Provider, MD  psyllium (METAMUCIL SMOOTH TEXTURE) 28 % packet Take 1 packet by mouth 2 (two) times daily. 03/15/11 03/14/12 Yes Andrena Mews, DO  SPIRIVA HANDIHALER 18 MCG inhalation capsule INHALE 1 PUFF BY MOUTH EVERY DAY 07/27/11  Yes Heather M Marte, PA-C  spironolactone (ALDACTONE) 25 MG tablet TAKE 1 TABLET BY MOUTH EVERY DAY 07/27/11  Yes Nelva Nay, PA-C  B-D INS SYR ULTRAFINE 1CC/31G 31G X 5/16" 1 ML MISC USE AS DIRECTED 07/10/11   Pattricia Boss, PA-C  permethrin (ELIMITE) 5 % cream APPLY TO THE AFFECTED AREA ONCE 10/16/11   Sondra Barges, PA-C   Physical Exam: Filed Vitals:   10/16/11 0927 10/16/11 0937  BP:  114/66  Pulse: 114 110  Temp:  99.2 F (37.3 C)  TempSrc:  Oral  Resp: 16 24  SpO2: 94% 96%     General:  Pt in NAD, Sitting up, Alert and oriented x 3  Eyes: EOMI, PERRLA  ENT: no masses, WNL   Neck: Trach in place, clean  Cardiovascular: RRR, No MRG  Respiratory: Rhales more at bases than at apices, with prolonged expiratory phase  Abdomen: soft, NT, ND  Skin: no rhashes, escoriations on abdomen.  Musculoskeletal: moves all extremities without discomfort  Psychiatric: Mood and affect appropriate  Neurologic: None focal, responds to questions appropriately.  Labs on Admission:  Basic Metabolic Panel:  Lab 10/16/11 1610  NA 133*  K 4.5  CL 92*  CO2 31  GLUCOSE 290*  BUN 16  CREATININE 0.85  CALCIUM 9.1  MG --  PHOS --   Liver Function Tests: No results found for this basename: AST:5,ALT:5,ALKPHOS:5,BILITOT:5,PROT:5,ALBUMIN:5 in the last 168 hours No results found for this basename: LIPASE:5,AMYLASE:5 in the last 168 hours No results found for this basename: AMMONIA:5 in the last 168 hours CBC:  Lab 10/16/11 0928 10/16/11 0854  WBC 12.6* 13.2*  NEUTROABS 9.1* --  HGB 16.4 16.9  HCT 50.4 55.2*  MCV 89.4 91.9  PLT 208 --   Cardiac Enzymes:  Lab 10/16/11 0928  CKTOTAL --  CKMB --  CKMBINDEX --  TROPONINI <0.30    BNP (last 3 results)  Basename 10/16/11 0928  PROBNP 189.5*   CBG: No results found for this basename: GLUCAP:5 in the last 168 hours  Radiological Exams on Admission: Dg Chest Portable 1 View  10/16/2011  *RADIOLOGY REPORT*  Clinical Data: Dyspnea  PORTABLE CHEST - 1 VIEW  Comparison: 03/21/2009  Findings: Cardiomediastinal silhouette is stable.  There is  worsening in aeration with bilateral mild interstitial prominence and hazy airspace disease highly suspicious for bilateral pulmonary edema.  Tracheostomy tube is unchanged in position. Bilateral basilar atelectasis.  IMPRESSION: There is worsening in aeration with bilateral mild interstitial prominence and hazy airspace disease highly suspicious for bilateral pulmonary edema.  Tracheostomy tube is unchanged in position.  Original Report Authenticated By: Natasha Mead, M.D.    EKG: Independently reviewed. Pending  Assessment/Plan Active Problems:  Sleep apnea  COPD exacerbation  OSA  CHF (congestive heart failure)  Hypertension  Hypercholesteremia  Community acquired pneumonia  Tracheostomy dependent   1. COPD exacerbation - Place on stepdown initially.  With continued improvement will plan on transferring to floor - nebulizers, systemic steroids, continue home regimen advair and spiriva, place on levaquin starting tomorrow given that he has had rocephin and azithromax today. - Sputum culture  2.  OSA - Will have respiratory therapy assess patient  - supplemental oxygen prn  3. Acute on chronic CHF - At this point uncertain if systolic or diastolic.  Last echo done 1/21 and EF was unable to assess at that time.  - Agree with lasix x 1 time today, continue lasix at home dose for tomorrow. - Will saline lock patient - BNP slightly elevated, will plan on ordering echocardiogram, and cycle cardiac enzymes. - Fluid restrict patient.  4. DM - Not well controlled.  Will place on SSI, monitor CBG's, continue home lantus regimen. - Adjust medications pending blood sugar readings - Diabetic diet.  5. HPL - Stable continue home regimen of statin  Please refer to orders for further details.  Code Status: Full Family Communication: No family at bedside Disposition Plan: Pending improvement in breathing status.  Time spent: > 50 minutes.  Penny Pia [facility] Pager  [phone]  If 7PM-7AM, please contact night-coverage [email or web address] Password [facility] 10/16/2011, 12:36 PM

## 2011-10-16 NOTE — ED Notes (Signed)
ZOX:[id number]<AV> Expected date:10/16/11<BR> Expected time: 9:03 AM<BR> Means of arrival:<BR> Comments:<BR> 81 male nursing home SOB

## 2011-10-16 NOTE — Progress Notes (Signed)
Disposition Note  Tyrone Cunningham, is a 56 y.o. male,   MRN: [id number]  -  DOB - 20-Aug-1955  Outpatient Primary MD for the patient is DAUB, [name], MD   Blood pressure 114/66, pulse 110, temperature 99.2 F (37.3 C), temperature source Oral, resp. rate 24, SpO2 96.00%.  Active Problems:  CHF (congestive heart failure)  Community acquired pneumonia  Sleep apnea  Bronchitis  Hypertension  Hypercholesteremia  Tracheostomy dependent   56 yo male with h/o osa, tracheostomy, CHF (EF undetermined in 2011 Echo), presents after going to his Urgent Care PCP's office this am for worsening SOB over the last 10 days.  His temp was 100.4 and oxygen sat was 77% in PCP office.  He has bilateral pulmonary edema on  CXR, and there is concern for retrocardiac opacities.  WBC 13.2.  Still awaiting serum creatinine.    He received steroids at urgent and antibiotics in the ED this am.  I have requested a Step down bed for Acute on Chronic respiratory failure likely due to a combination of CHF and possible PNA.   Algis Downs, PA-C [facility] Pager: [phone]

## 2011-10-16 NOTE — ED Notes (Signed)
Per pt, Went to UC for SOB this am and received steroid inj and was sent here.

## 2011-10-16 NOTE — ED Notes (Signed)
Pt. Was put on a gown,vitals signs ,EKG was done

## 2011-10-16 NOTE — ED Notes (Signed)
Materials notified of need for trach cleaning kit.

## 2011-10-16 NOTE — ED Notes (Signed)
Pt reports self care of size 7 trach since 2001. Pt performs all care for trach. Pt reports he does not sleep with O2 but will occasionally use O@ if SOB during day.

## 2011-10-17 DIAGNOSIS — I517 Cardiomegaly: Secondary | ICD-10-CM

## 2011-10-17 DIAGNOSIS — E1149 Type 2 diabetes mellitus with other diabetic neurological complication: Secondary | ICD-10-CM | POA: Insufficient documentation

## 2011-10-17 DIAGNOSIS — E119 Type 2 diabetes mellitus without complications: Secondary | ICD-10-CM

## 2011-10-17 LAB — CBC
MCH: 28.6 pg (ref 26.0–34.0)
MCHC: 31.8 g/dL (ref 30.0–36.0)
MCV: 90 fL (ref 78.0–100.0)
Platelets: 214 10*3/uL (ref 150–400)
RDW: 16.9 % — ABNORMAL HIGH (ref 11.5–15.5)

## 2011-10-17 LAB — BASIC METABOLIC PANEL
CO2: 37 mEq/L — ABNORMAL HIGH (ref 19–32)
Calcium: 8.7 mg/dL (ref 8.4–10.5)
Calcium: 9.4 mg/dL (ref 8.4–10.5)
Chloride: 88 mEq/L — ABNORMAL LOW (ref 96–112)
Creatinine, Ser: 0.78 mg/dL (ref 0.50–1.35)
GFR calc Af Amer: >90 mL/min (ref >90)
GFR calc Af Amer: >90 mL/min (ref >90)
Sodium: 130 mEq/L — ABNORMAL LOW (ref 135–145)

## 2011-10-17 LAB — GLUCOSE, CAPILLARY
Glucose-Capillary: 408 mg/dL — ABNORMAL HIGH (ref 70–99)
Glucose-Capillary: 409 mg/dL — ABNORMAL HIGH (ref 70–99)
Glucose-Capillary: 316 mg/dL — ABNORMAL HIGH (ref 70–99)

## 2011-10-17 LAB — CARDIAC PANEL(CRET KIN+CKTOT+MB+TROPI)
CK, MB: 3.3 ng/mL (ref 0.3–4.0)
Relative Index: INVALID (ref 0.0–2.5)
Total CK: 73 U/L (ref 7–232)
Troponin I: <0.3 ng/mL (ref <0.30)

## 2011-10-17 LAB — HEMOGLOBIN A1C: Hgb A1c MFr Bld: 10.7 % — ABNORMAL HIGH (ref <5.7)

## 2011-10-17 MED ORDER — SODIUM POLYSTYRENE SULFONATE 15 GM/60ML PO SUSP
15.0000 g | Freq: Once | ORAL | Status: AC
  Administered 2011-10-17: 15 g via ORAL
  Filled 2011-10-17 (×2): qty 60

## 2011-10-17 MED ORDER — POLYETHYLENE GLYCOL 3350 17 G PO PACK
17.0000 g | PACK | Freq: Every day | ORAL | Status: DC
  Administered 2011-10-17 – 2011-10-20 (×4): 17 g via ORAL
  Filled 2011-10-17 (×4): qty 1

## 2011-10-17 MED ORDER — INSULIN ASPART 100 UNIT/ML ~~LOC~~ SOLN
5.0000 [IU] | Freq: Three times a day (TID) | SUBCUTANEOUS | Status: DC
  Administered 2011-10-17 – 2011-10-18 (×4): 5 [IU] via SUBCUTANEOUS

## 2011-10-17 MED ORDER — DIPHENHYDRAMINE HCL 50 MG PO CAPS
50.0000 mg | ORAL_CAPSULE | Freq: Every evening | ORAL | Status: DC | PRN
  Administered 2011-10-18 – 2011-10-20 (×3): 50 mg via ORAL
  Filled 2011-10-17 (×3): qty 1

## 2011-10-17 MED ORDER — INSULIN GLARGINE 100 UNIT/ML ~~LOC~~ SOLN
90.0000 [IU] | SUBCUTANEOUS | Status: DC
  Administered 2011-10-17 – 2011-10-19 (×3): 90 [IU] via SUBCUTANEOUS

## 2011-10-17 NOTE — Progress Notes (Signed)
  Echocardiogram 2D Echocardiogram has been performed.  Cathie Beams 10/17/2011, 9:53 AM

## 2011-10-17 NOTE — Progress Notes (Signed)
CARE MANAGEMENT NOTE 10/17/2011  Patient:  Tyrone Cunningham, Tyrone Cunningham   Account Number:  [id number]  Date Initiated:  10/17/2011  Documentation initiated by:  DAVIS,RHONDA  Subjective/Objective Assessment:   pt with multiple medical conditions has perm, trach in place o2 dropped down into the low 80's, diabetic, sent from mdo with o2 level     Action/Plan:   from home   Anticipated DC Date:  10/20/2011   Anticipated DC Plan:  HOME W HOME HEALTH SERVICES  In-house referral  NA      DC Planning Services  NA      [facility] Choice  NA   Choice offered to / List presented to:  NA   DME arranged  NA      DME agency  NA     HH arranged  NA      HH agency  NA   Status of service:  In process, will continue to follow Medicare Important Message given?  NA - LOS <3 / Initial given by admissions (If response is "NO", the following Medicare IM given date fields will be blank) Date Medicare IM given:   Date Additional Medicare IM given:    Discharge Disposition:    Per UR Regulation:  Reviewed for med. necessity/level of care/duration of stay  If discussed at Long Length of Stay Meetings, dates discussed:    Comments:  [id number] Earlene Plater, RN, BSN, CCM: CHART REVIEWED AND UPDATED. NO DISCHARGE NEEDS PRESENT AT THIS TIME. CASE MANAGEMENT [phone]

## 2011-10-17 NOTE — Progress Notes (Signed)
Pt is not wearing the metal inner cannula of his trach, stating he breathes better without it. He is keeping it in his pocket. He reluctantly allowed me to clean it.  The inner canula was cleaned, wrapped and placed in a plastic bag and labeled with his name. Pt was shown the debris that was removed and advised this could have become a source of a respiratory infection. Reviewed with pt proper cleaning procedure and supplies. Pt taught procedure back for clarification.   Pt placed bag with inner cannula in his pocket.

## 2011-10-17 NOTE — Progress Notes (Addendum)
[facility] PROGRESS NOTE  Tyrone Cunningham ZOX:[id number] DOB: Feb 10, 1956 DOA: 10/16/2011 PCP: Lucilla Edin, MD  Assessment/Plan: Principal Problem:  *COPD with acute exacerbation Active Problems:  Sleep apnea  CHF (congestive heart failure)  Hypertension  Hypercholesteremia  Community acquired pneumonia  Tracheostomy dependent  OSA (obstructive sleep apnea)  1. COPD exacerbation - nebulizers, systemic steroids, continue home regimen advair and spiriva, place on levaquin  - Sputum culture   2. OSA  - Will have respiratory therapy assess patient  - supplemental oxygen prn   3. Acute on chronic CHF  - At this point uncertain if systolic or diastolic. Last echo done 1/21 and EF was unable to assess at that time.  - lasix at home dose - Will saline lock patient  - BNP slightly elevated, echocardiogram pending, and cycle cardiac enzymes.   4. DM  - Not well controlled. Will place on SSI, monitor CBG's, titrate up home lantus regimen.   - add Diabetic diet.  -watch closely as on steroids  5. Acute on chronic respiratory failure- -trach -weaning off O2 as possible -occasionally on home O2 (wears off and on)  6. Hyponatremia -fluid restrict -corrects some with increased blood sugars  7. hyperkalemia- Hold aldactone and give kay exelate   Code Status: full Family Communication: patient at bedside Disposition Plan: home when better   ?transfer to floor later today    HPI/Subjective: Patient says he feel like he has improved since admission Wanting medications to be changed to his home regimine  Objective: Filed Vitals:   10/17/11 0018 10/17/11 0404 10/17/11 0515 10/17/11 0819  BP:   138/72   Pulse:   101   Temp:  98 F (36.7 C) 98.2 F (36.8 C)   TempSrc:   Oral   Resp:      Height:      Weight:      SpO2: 98% 96% 94% 94%    Intake/Output Summary (Last 24 hours) at 10/17/11 0832 Last data filed at 10/17/11 0500  Gross per 24 hour  Intake    650 ml   Output    975 ml  Net   -325 ml   Filed Weights   10/16/11 1457  Weight: 165.3 kg (364 lb 6.7 oz)    Exam:   General:  Awake, oriented x 3, NAD, obese male with trach  Cardiovascular: slightly sinus tach  Respiratory: dec breath sounds B/L, +wheezing  Abdomen: +BS, soft, obese  Skin: no rashes or lesions  Data Reviewed: Basic Metabolic Panel:  Lab 10/17/11 8119 10/16/11 0928  NA 130* 133*  K 6.0* 4.5  CL 92* 92*  CO2 33* 31  GLUCOSE 315* 290*  BUN 28* 16  CREATININE 0.78 0.85  CALCIUM 8.7 9.1  MG -- --  PHOS -- --   Liver Function Tests: No results found for this basename: AST:5,ALT:5,ALKPHOS:5,BILITOT:5,PROT:5,ALBUMIN:5 in the last 168 hours No results found for this basename: LIPASE:5,AMYLASE:5 in the last 168 hours No results found for this basename: AMMONIA:5 in the last 168 hours CBC:  Lab 10/17/11 0645 10/16/11 0928 10/16/11 0854  WBC 15.3* 12.6* 13.2*  NEUTROABS -- 9.1* --  HGB 15.7 16.4 16.9  HCT 49.3 50.4 55.2*  MCV 90.0 89.4 91.9  PLT 214 208 --   Cardiac Enzymes:  Lab 10/17/11 0645 10/17/11 0012 10/16/11 1617 10/16/11 0928  CKTOTAL 68 73 81 --  CKMB 3.3 3.3 3.4 --  CKMBINDEX -- -- -- --  TROPONINI <0.30 <0.30 <0.30 <0.30   BNP (  last 3 results)  Basename 10/16/11 0928  PROBNP 189.5*   CBG:  Lab 10/17/11 0804 10/16/11 2155 10/16/11 1555  GLUCAP 294* 396* 392*    Recent Results (from the past 240 hour(s))  CULTURE, BLOOD (ROUTINE X 2)     Status: Normal (Preliminary result)   Collection Time   10/16/11  9:49 AM      Component Value Range Status Comment   Specimen Description BLOOD RIGHT HAND   Final    Special Requests BOTTLES DRAWN AEROBIC AND ANAEROBIC 5CC   Final    Culture  Setup Time 10/16/2011 14:19   Final    Culture     Final    Value:        BLOOD CULTURE RECEIVED NO GROWTH TO DATE CULTURE WILL BE HELD FOR 5 DAYS BEFORE ISSUING A FINAL NEGATIVE REPORT   Report Status PENDING   Incomplete   CULTURE, BLOOD (ROUTINE X 2)      Status: Normal (Preliminary result)   Collection Time   10/16/11  9:56 AM      Component Value Range Status Comment   Specimen Description BLOOD LEFT HAND   Final    Special Requests BOTTLES DRAWN AEROBIC AND ANAEROBIC 4CC   Final    Culture  Setup Time 10/16/2011 14:19   Final    Culture     Final    Value:        BLOOD CULTURE RECEIVED NO GROWTH TO DATE CULTURE WILL BE HELD FOR 5 DAYS BEFORE ISSUING A FINAL NEGATIVE REPORT   Report Status PENDING   Incomplete   MRSA PCR SCREENING     Status: Normal   Collection Time   10/16/11  4:34 PM      Component Value Range Status Comment   MRSA by PCR NEGATIVE  NEGATIVE Final      Studies: Dg Chest Portable 1 View  10/16/2011  *RADIOLOGY REPORT*  Clinical Data: Dyspnea  PORTABLE CHEST - 1 VIEW  Comparison: 03/21/2009  Findings: Cardiomediastinal silhouette is stable.  There is worsening in aeration with bilateral mild interstitial prominence and hazy airspace disease highly suspicious for bilateral pulmonary edema.  Tracheostomy tube is unchanged in position. Bilateral basilar atelectasis.  IMPRESSION: There is worsening in aeration with bilateral mild interstitial prominence and hazy airspace disease highly suspicious for bilateral pulmonary edema.  Tracheostomy tube is unchanged in position.  Original Report Authenticated By: Natasha Mead, M.D.    Scheduled Meds:   . albuterol  2.5 mg Nebulization STAT  . albuterol  2.5 mg Nebulization Q4H  . albuterol      . atorvastatin  10 mg Oral QHS  . azithromycin (ZITHROMAX) 500 MG IVPB  500 mg Intravenous Once  . Fluticasone-Salmeterol  1 puff Inhalation BID  . furosemide  40 mg Intravenous Once  . furosemide  40 mg Oral Daily  . gabapentin  100 mg Oral TID  . heparin  5,000 Units Subcutaneous Q8H  . HYDROcodone-acetaminophen  1 tablet Oral Q4H  . insulin aspart  0-20 Units Subcutaneous TID WC & HS  . insulin glargine  80 Units Subcutaneous Q24H  . ipratropium  0.5 mg Nebulization Once  .  levofloxacin (LEVAQUIN) IV  750 mg Intravenous Q24H  . methylPREDNISolone (SOLU-MEDROL) injection  60 mg Intravenous Q12H  . polyethylene glycol  17 g Oral Daily  . sodium chloride  3 mL Intravenous Q12H  . sodium chloride  3 mL Intravenous Q12H  . sodium chloride      .  spironolactone  12.5 mg Oral Daily  . tiotropium  18 mcg Inhalation Daily  . DISCONTD: cefTRIAXone (ROCEPHIN) IVPB 1 gram/50 mL D5W  1 g Intravenous Q24H  . DISCONTD: insulin aspart  0-20 Units Subcutaneous TID WC  . DISCONTD: psyllium  1 packet Oral Daily   Continuous Infusions:   Principal Problem:  *COPD with acute exacerbation Active Problems:  Sleep apnea  CHF (congestive heart failure)  Hypertension  Hypercholesteremia  Community acquired pneumonia  Tracheostomy dependent  OSA (obstructive sleep apnea)    Time spent: 35 min    Tyrone Cunningham  [facility] Pager [phone],  10/17/2011, 8:32 AM  LOS: 1 day

## 2011-10-17 NOTE — Progress Notes (Signed)
PHYSICAL THERAPY NOTE. -- Pt. Observed mobilizing in the room, RN states Pt. Does not need PT at this time. PT signing off.Janaiya Beauchesne PT [phone]

## 2011-10-17 NOTE — Clinical Documentation Improvement (Signed)
BMI DOCUMENTATION CLARIFICATION QUERY  THIS DOCUMENT IS NOT A PERMANENT PART OF THE MEDICAL RECORD  TO RESPOND TO THE THIS QUERY, FOLLOW THE INSTRUCTIONS BELOW:  1. If needed, update documentation for the patient's encounter via the notes activity.  2. Access this query again and click edit on the In [employer].  3. After updating, or not, click F2 to complete all highlighted (required) fields concerning your review. Select "additional documentation in the medical record" OR "no additional documentation provided".  4. Click Sign note button.  5. The deficiency will fall out of your In Basket *Please let us know if you are not able to complete this workflow by phone or e-mail (listed below).         10/17/11  Dear Dr.  Cena Benton, [facility]  In an effort to better capture your patient's severity of illness, reflect appropriate length of stay and utilization of resources, a review of the patient medical record has revealed the following indicators.    Based on your clinical judgment, please clarify and document in a progress note and/or discharge summary the clinical condition associated with the following supporting information:  In responding to this query please exercise your independent judgment.  The fact that a query is asked, does not imply that any particular answer is desired or expected.    Pt's BMI=  57.08                       Please clarify whether or not BMI can be linked to one of he diagnoses listed below and document in pn  and d/c summary.. Thank You!  [facility]ACT[facility]: When linking BMI to a diagnosis please document both BMI and diagnosis together in pn for accuracy of severity of illness (SOI) and risk of mortality (ROM).    Possible Clinical conditions  Morbid Obesity W/ BMI= 57.08  Underweight w/BMI=  Other condition___________________   Cannot Clinically determine _____________  Risk Factors: Sign & Symptoms: Weight:364 lbs Height 5'7" BMI=  57.08  Treatment Monitoring   Reviewed: additional documentation in the medical record ljh  Thank You,  Enis Slipper  RN, BSN, MSN/Inf, CCDS Clinical Documentation Specialist [facility] Dept Pager: [phone] / E-mail: [email or web address] Health Information Management [place]

## 2011-10-18 LAB — BASIC METABOLIC PANEL
CO2: 36 mEq/L — ABNORMAL HIGH (ref 19–32)
Calcium: 9.5 mg/dL (ref 8.4–10.5)
Chloride: 88 mEq/L — ABNORMAL LOW (ref 96–112)
Potassium: 4.9 mEq/L (ref 3.5–5.1)
Sodium: 129 mEq/L — ABNORMAL LOW (ref 135–145)

## 2011-10-18 LAB — CBC
Hemoglobin: 16.1 g/dL (ref 13.0–17.0)
MCH: 28.3 pg (ref 26.0–34.0)
Platelets: 218 10*3/uL (ref 150–400)
RBC: 5.69 MIL/uL (ref 4.22–5.81)
WBC: 15.7 10*3/uL — ABNORMAL HIGH (ref 4.0–10.5)

## 2011-10-18 LAB — GLUCOSE, CAPILLARY: Glucose-Capillary: 297 mg/dL — ABNORMAL HIGH (ref 70–99)

## 2011-10-18 MED ORDER — INSULIN ASPART 100 UNIT/ML ~~LOC~~ SOLN
10.0000 [IU] | Freq: Three times a day (TID) | SUBCUTANEOUS | Status: DC
  Administered 2011-10-18 – 2011-10-20 (×6): 10 [IU] via SUBCUTANEOUS

## 2011-10-18 NOTE — Progress Notes (Signed)
Cm spoke with patient concerning HH needs. Pt states receiving trach supplies from [facility]. Patient states had live-in caretaker up until a week ago. Patient states [facility] provided home health aide until recent incident which resulted in him no longer choosing care from agency. Per pt choice [facility] to provide HHRN/Respiratory care/HHA upon discharge. Patient states friends to assist in home care & clean house this Tuesday & Wednesday. Patient prefers [facility] to come later in week after house is cleaned. No other needs specified at this time. PCP: Lucilla Edin, MD.   Leonie Green [phone]

## 2011-10-18 NOTE — Progress Notes (Addendum)
[facility] PROGRESS NOTE  Tyrone Cunningham ZOX:[id number] DOB: 05/22/1955 DOA: 10/16/2011 PCP: Lucilla Edin, MD  Brief narrative: 56 year old male with history of OHS/OSA, COPD and CHF, Dyslipidemia, DM and HTN who presented with acute onset shortness of breath associated with cough productive of greenish sputum.  Assessment/Plan:  Principal Problem:  *COPD with acute exacerbation - will continue bronchodilators PRN - continue advair and spiriva - continue solumedrol 60 mg Q 12 hours - O2 support via nasal canula to keep O2 saturation above 90% - antibiotics for possible pneumonia, Levaquin  Active Problems:  Sleep apnea (OSA/OHS) - appreciate respiratory follow up   CHF (congestive heart failure), chronic, grade I diastolic - may continue lasix 40 mg daily PO - daily weight and strict intake and output - 2 D ECHO don eon this admission with grossly normal LV function   Hypertension - not sure why BP not recorded in EPIC - continue lasix   Hypercholesteremia - continue atorvastatin   Community acquired pneumonia - continue Levaquin IV - blood culture and respiratory culture negative to date   Tracheostomy dependent - per respiratory care  Hyponatremia - likely diuretic induced - sodium 129 - continue to monitor - check TSH   Diabetes mellitus, uncontrolled with complications including neuropathy - CBG 431, 297, 293, 316 - we will increase novolog to 10 units TIDAC and leave lantus 90 units Q 24 hours - appreciate diabetic education, diabetic consult - A1c on this admission 10.7 - continue gabapentin  Morbid obesity - nutrition consult  Code Status: Full  Family Communication: No family at bedside  Disposition Plan: Home when stable, no PT needed as patient able to ambulate on his own   Manson Passey, MD  [facility] Pager [phone]  If 7PM-7AM, please contact night-coverage [email or web address] Password [email or web address] 10/18/2011, 1:39 PM   LOS: 2  days   Consultants:  Nutrition  Procedures:  none  Antibiotics:  Levaqin  HPI/Subjective: No acute events overnight.  Objective: Filed Vitals:   10/18/11 0809 10/18/11 0817 10/18/11 1254 10/18/11 1256  BP:      Pulse:  101  93  Temp:      TempSrc:      Resp:  20  20  Height:      Weight:      SpO2: 91% 91% 94% 94%    Intake/Output Summary (Last 24 hours) at 10/18/11 1339 Last data filed at 10/18/11 1255  Gross per 24 hour  Intake    840 ml  Output   4275 ml  Net  -3435 ml    Exam:   General:  Pt is alert, follows commands appropriately, not in acute distress, morbidly obese  Cardiovascular: Regular rate and rhythm, S1/S2, no murmurs, no rubs, no gallops  Respiratory: diminished breath sounds bilaterally, wheezing appreciated  Abdomen: Soft, non tender, non distended, bowel sounds present, no guarding  Extremities: No edema, pulses DP and PT palpable bilaterally  Neuro: Grossly nonfocal  Data Reviewed: Basic Metabolic Panel:  Lab 10/18/11 8295 10/17/11 1840 10/17/11 0645 10/16/11 0928  NA 129* 130* 130* 133*  K 4.9 5.1 6.0* 4.5  CL 88* 88* 92* 92*  CO2 36* 37* 33* 31  GLUCOSE 295* 213* 315* 290*  BUN 30* 28* 28* 16  CREATININE 0.81 0.71 0.78 0.85  CALCIUM 9.5 9.4 8.7 9.1  MG -- -- -- --  PHOS -- -- -- --   CBC:  Lab 10/18/11 0531 10/17/11 0645 10/16/11 0928 10/16/11 0854  WBC 15.7*  15.3* 12.6* 13.2*  HGB 16.1 15.7 16.4 16.9  HCT 51.6 49.3 50.4 55.2*  MCV 90.7 90.0 89.4 91.9  PLT 218 214 208 --   Cardiac Enzymes:  Lab 10/17/11 0645 10/17/11 0012 10/16/11 1617 10/16/11 0928  CKTOTAL 68 73 81 --  CKMB 3.3 3.3 3.4 --  CKMBINDEX -- -- -- --  TROPONINI <0.30 <0.30 <0.30 <0.30   BNP: No components found with this basename: POCBNP:5 CBG:  Lab 10/18/11 1207 10/18/11 0736 10/17/11 2153 10/17/11 1646 10/17/11 1319  GLUCAP 431* 297* 293* 316* 408*    Recent Results (from the past 240 hour(s))  CULTURE, BLOOD (ROUTINE X 2)     Status:  Normal (Preliminary result)   Collection Time   10/16/11  9:49 AM      Component Value Range Status Comment   Specimen Description BLOOD RIGHT HAND   Final    Special Requests BOTTLES DRAWN AEROBIC AND ANAEROBIC 5CC   Final    Culture  Setup Time 10/16/2011 14:19   Final    Culture     Final    Value:        BLOOD CULTURE RECEIVED NO GROWTH TO DATE CULTURE WILL BE HELD FOR 5 DAYS BEFORE ISSUING A FINAL NEGATIVE REPORT   Report Status PENDING   Incomplete   CULTURE, BLOOD (ROUTINE X 2)     Status: Normal (Preliminary result)   Collection Time   10/16/11  9:56 AM      Component Value Range Status Comment   Specimen Description BLOOD LEFT HAND   Final    Special Requests BOTTLES DRAWN AEROBIC AND ANAEROBIC 4CC   Final    Culture  Setup Time 10/16/2011 14:19   Final    Culture     Final    Value:        BLOOD CULTURE RECEIVED NO GROWTH TO DATE CULTURE WILL BE HELD FOR 5 DAYS BEFORE ISSUING A FINAL NEGATIVE REPORT   Report Status PENDING   Incomplete   MRSA PCR SCREENING     Status: Normal   Collection Time   10/16/11  4:34 PM      Component Value Range Status Comment   MRSA by PCR NEGATIVE  NEGATIVE Final   CULTURE, RESPIRATORY     Status: Normal (Preliminary result)   Collection Time   10/17/11  5:15 AM      Component Value Range Status Comment   Specimen Description TRACHEAL ASPIRATE   Final    Special Requests NONE   Final    Gram Stain     Final    Value: NO WBC SEEN     NO SQUAMOUS EPITHELIAL CELLS SEEN     NO ORGANISMS SEEN   Culture Culture reincubated for better growth   Final    Report Status PENDING   Incomplete      Studies: No results found.  Scheduled Meds:   . albuterol  2.5 mg Nebulization Q4H  . atorvastatin  10 mg Oral QHS  . Fluticasone-Salmeterol  1 puff Inhalation BID  . furosemide  40 mg Oral Daily  . gabapentin  100 mg Oral TID  . heparin  5,000 Units Subcutaneous Q8H  . HYDROcodacetamin  1 tablet Oral Q4H  . insulin aspart  0-20 Units Subcutaneous TID  WC & HS  . insulin aspart  10 Units Subcutaneous TID WC  . insulin glargine  90 Units Subcutaneous Q24H  . levofloxacin (LEVAQUIN)   750 mg Intravenous Q24H  . methylPREDNISolone  60 mg Intravenous Q12H  . polyethylene glycol  17 g Oral Daily  . sodium polystyrene  15 g Oral Once  . tiotropium  18 mcg Inhalation Daily

## 2011-10-19 LAB — BASIC METABOLIC PANEL
CO2: 37 mEq/L — ABNORMAL HIGH (ref 19–32)
Calcium: 9.8 mg/dL (ref 8.4–10.5)
Creatinine, Ser: 0.7 mg/dL (ref 0.50–1.35)
Glucose, Bld: 267 mg/dL — ABNORMAL HIGH (ref 70–99)

## 2011-10-19 LAB — GLUCOSE, CAPILLARY: Glucose-Capillary: 266 mg/dL — ABNORMAL HIGH (ref 70–99)

## 2011-10-19 LAB — CBC
MCH: 28.5 pg (ref 26.0–34.0)
MCHC: 31.7 g/dL (ref 30.0–36.0)
MCV: 89.7 fL (ref 78.0–100.0)
Platelets: 202 10*3/uL (ref 150–400)
RBC: 5.55 MIL/uL (ref 4.22–5.81)

## 2011-10-19 MED ORDER — METHYLPREDNISOLONE SODIUM SUCC 125 MG IJ SOLR
60.0000 mg | INTRAMUSCULAR | Status: DC
  Administered 2011-10-20: 60 mg via INTRAVENOUS
  Filled 2011-10-19 (×2): qty 0.96

## 2011-10-19 NOTE — Progress Notes (Signed)
Nutrition dx:  Not adhering to nutrition-related recommendations r/t difficult relationship situation AEB pt comments; HgbA1c: 10.7  Intervention:  Brief education related to DM diet;  Provided. Pt was already aware of complex vs simple CHO and which foods contain CHO. Pt stated his past relationship situation caused him to not care anymore about his diet, but he is ready to follow diet recommendations now. Goals of nutrition therapy discussed.  Understanding confirmed.  RD contact information provided.  Monitoring:  Knowledge; for questions.  Please consult RD if new questions present.  Leonette Most RD, LDN

## 2011-10-19 NOTE — Progress Notes (Signed)
[facility] PROGRESS NOTE  Tyrone Cunningham ZOX:[id number] DOB: Sep 22, 1955 DOA: 10/16/2011 PCP: Lucilla Edin, MD  Brief narrative: 56 year old male with history of OHS/OSA, COPD and CHF, Dyslipidemia, DM and HTN who presented with acute onset shortness of breath associated with cough productive of greenish sputum.   Assessment/Plan:   Principal Problem:  *COPD with acute exacerbation  - continue bronchodilators PRN  - continue advair and spiriva  - continue solumedrol 60 mg Q 24 hours - O2 support via nasal canula to keep O2 saturation above 90%  - antibiotics for  pneumonia, Levaquin   Active Problems:  Sleep apnea (OSA/OHS)  - appreciate respiratory follow up   CHF (congestive heart failure), chronic, grade I diastolic  - may continue lasix 40 mg daily PO  - continue daily weight  - 2 D ECHO done on this admission with grossly normal LV function   Hypertension  - BP 109/41 - continue lasix   Hypercholesteremia  - continue atorvastatin   Community acquired pneumonia  - continue Levaquin IV - all blood culture and respiratory culture negative to date   Tracheostomy dependent  - per respiratory care   Hyponatremia  - likely diuretic induced  - sodium 131 today - continue to monitor  - check TSH - pending  Diabetes mellitus, uncontrolled with complications including neuropathy  - CBG 265, 340, 293 -  novolog  10 units TIDAC and lantus 90 units Q 24 hours  - appreciate diabetic education, diabetic consult  - A1c on this admission 10.7  - continue gabapentin   Morbid obesity  - nutrition consulted   Code Status: Full  Family Communication: No family at bedside  Disposition Plan: Home when stable, no PT needed as patient able to ambulate on his own   Consultants:  Nutrition Procedures:  none Antibiotics:  Gaspar Bidding, MD  [facility] Pager [phone]  If 7PM-7AM, please contact night-coverage [email or web address] Password  [name] 10/19/2011, 8:43 AM   LOS: 3 days   HPI/Subjective: No acute events overnight,   Objective: Filed Vitals:   10/19/11 0407 10/19/11 0542 10/19/11 0828 10/19/11 0830  BP:  109/41    Pulse: 87 78  88  Temp:  97.8 F (36.6 C)    TempSrc:  Oral    Resp: 18 20  18   Height:      Weight:  163.25 kg (359 lb 14.4 oz)    SpO2: 97% 98% 96% 96%    Intake/Output Summary (Last 24 hours) at 10/19/11 0843 Last data filed at 10/19/11 0700  Gross per 24 hour  Intake    840 ml  Output   4650 ml  Net  -3810 ml    Exam:   General:  Pt is alert, follows commands appropriately, not in acute distress  Cardiovascular: Regular rate and rhythm, S1/S2, no murmurs, no rubs, no gallops  Respiratory: some wheezing appreciated but no rhonchi  Abdomen: Soft, non tender, non distended, bowel sounds present, no guarding  Extremities: No edema, pulses DP and PT palpable bilaterally  Neuro: Grossly nonfocal  Data Reviewed: Basic Metabolic Panel:  Lab 10/19/11 8295 10/18/11 0531 10/17/11 1840 10/17/11 0645 10/16/11 0928  NA 131* 129* 130* 130* 133*  K 5.1 4.9 5.1 6.0* 4.5  CL 91* 88* 88* 92* 92*  CO2 37* 36* 37* 33* 31  GLUCOSE 267* 295* 213* 315* 290*  BUN 26* 30* 28* 28* 16  CREATININE 0.70 0.81 0.71 0.78 0.85  CALCIUM 9.8 9.5 9.4 8.7  9.1  MG -- -- -- -- --  PHOS -- -- -- -- --   CBC:  Lab 10/19/11 0529 10/18/11 0531 10/17/11 0645 10/16/11 0928 10/16/11 0854  WBC 13.0* 15.7* 15.3* 12.6* 13.2*  NEUTROABS -- -- -- 9.1* --  HGB 15.8 16.1 15.7 16.4 16.9  HCT 49.8 51.6 49.3 50.4 55.2*  MCV 89.7 90.7 90.0 89.4 91.9  PLT 202 218 214 208 --   Cardiac Enzymes:  Lab 10/17/11 0645 10/17/11 0012 10/16/11 1617 10/16/11 0928  CKTOTAL 68 73 81 --  CKMB 3.3 3.3 3.4 --  CKMBINDEX -- -- -- --  TROPONINI <0.30 <0.30 <0.30 <0.30   BNP: No components found with this basename: POCBNP:5 CBG:  Lab 10/19/11 0725 10/18/11 2056 10/18/11 1705 10/18/11 1207 10/18/11 0736  GLUCAP 265* 340* 293*  431* 297*    Recent Results (from the past 240 hour(s))  CULTURE, BLOOD (ROUTINE X 2)     Status: Normal (Preliminary result)   Collection Time   10/16/11  9:49 AM      Component Value Range Status Comment   Specimen Description BLOOD RIGHT HAND   Final    Special Requests BOTTLES DRAWN AEROBIC AND ANAEROBIC 5CC   Final    Culture  Setup Time 10/16/2011 14:19   Final    Culture     Final    Value:        BLOOD CULTURE RECEIVED NO GROWTH TO DATE CULTURE WILL BE HELD FOR 5 DAYS BEFORE ISSUING A FINAL NEGATIVE REPORT   Report Status PENDING   Incomplete   CULTURE, BLOOD (ROUTINE X 2)     Status: Normal (Preliminary result)   Collection Time   10/16/11  9:56 AM      Component Value Range Status Comment   Specimen Description BLOOD LEFT HAND   Final    Special Requests BOTTLES DRAWN AEROBIC AND ANAEROBIC 4CC   Final    Culture  Setup Time 10/16/2011 14:19   Final    Culture     Final    Value:        BLOOD CULTURE RECEIVED NO GROWTH TO DATE CULTURE WILL BE HELD FOR 5 DAYS BEFORE ISSUING A FINAL NEGATIVE REPORT   Report Status PENDING   Incomplete   MRSA PCR SCREENING     Status: Normal   Collection Time   10/16/11  4:34 PM      Component Value Range Status Comment   MRSA by PCR NEGATIVE  NEGATIVE Final   CULTURE, RESPIRATORY     Status: Normal (Preliminary result)   Collection Time   10/17/11  5:15 AM      Component Value Range Status Comment   Specimen Description TRACHEAL ASPIRATE   Final    Special Requests NONE   Final    Gram Stain     Final    Value: NO WBC SEEN     NO SQUAMOUS EPITHELIAL CELLS SEEN     NO ORGANISMS SEEN   Culture Culture reincubated for better growth   Final    Report Status PENDING   Incomplete      Studies: No results found.  Scheduled Meds:   . albuterol  2.5 mg Nebulization Q4H  . atorvastatin  10 mg Oral QHS  . Fluticasone-Salmeterol  1 puff Inhalation BID  . furosemide  40 mg Oral Daily  . gabapentin  100 mg Oral TID  . heparin  5,000 Units  Subcutaneous Q8H  . HYDROcodone-acetaminophen  1 tablet Oral  Q4H  . insulin aspart  0-20 Units Subcutaneous TID WC & HS  . insulin aspart  10 Units Subcutaneous TID WC  . insulin glargine  90 Units Subcutaneous Q24H  . levofloxacin (LEVAQUIN) IV  750 mg Intravenous Q24H  . methylPREDNISolone (SOLU-MEDROL) injection  60 mg Intravenous Q12H  . polyethylene glycol  17 g Oral Daily  . sodium chloride  3 mL Intravenous Q12H  . sodium chloride  3 mL Intravenous Q12H  . tiotropium  18 mcg Inhalation Daily  . DISCONTD: insulin aspart  5 Units Subcutaneous TID WC   Continuous Infusions:

## 2011-10-20 DIAGNOSIS — E1165 Type 2 diabetes mellitus with hyperglycemia: Secondary | ICD-10-CM | POA: Diagnosis present

## 2011-10-20 DIAGNOSIS — E669 Obesity, unspecified: Secondary | ICD-10-CM | POA: Diagnosis present

## 2011-10-20 LAB — GLUCOSE, CAPILLARY: Glucose-Capillary: 284 mg/dL — ABNORMAL HIGH (ref 70–99)

## 2011-10-20 LAB — CULTURE, RESPIRATORY W GRAM STAIN: Gram Stain: NONE SEEN

## 2011-10-20 MED ORDER — LEVOFLOXACIN 750 MG PO TABS: 750.0000 mg | ORAL_TABLET | Freq: Every day | ORAL | Status: AC

## 2011-10-20 MED ORDER — INSULIN GLARGINE 100 UNIT/ML ~~LOC~~ SOLN: 90.0000 [IU] | Freq: Every day | SUBCUTANEOUS | Status: DC

## 2011-10-20 MED ORDER — PREDNISONE 50 MG PO TABS: 50.0000 mg | ORAL_TABLET | Freq: Every day | ORAL | Status: AC

## 2011-10-20 MED ORDER — LEVOFLOXACIN 750 MG PO TABS: 750.0000 mg | ORAL_TABLET | Freq: Every day | ORAL | Status: DC

## 2011-10-20 NOTE — Progress Notes (Signed)
Inpatient Diabetes Program Recommendations  AACE/ADA: New Consensus Statement on Inpatient Glycemic Control (2013)  Target Ranges:  Prepandial:   less than 140 mg/dL      Peak postprandial:   less than 180 mg/dL (1-2 hours)      Critically ill patients:  140 - 180 mg/dL   Reason for Visit: Consult - Uncontrolled DM  Patient is a 56 y.o. male presenting with shortness of breath. Getting ready for discharge now.  States Dr. Cleta Alberts manages DM and he sees him on regular basis.  States he knows HgbA1C is high, because he just hasn't cared about it.  "But I'm going to start caring now."  Checks blood sugars and meds include Lantus 80 units QHS and Novolog S/S.  Marland KitchenActive Problems:  CHF (congestive heart failure)  Community acquired pneumonia  Sleep apnea  Bronchitis  Hypertension  Hypercholesteremia  Tracheostomy dependent Results for CASMER, YEPIZ (MRN [id number]) as of 10/20/2011 14:58  Ref. Range 10/18/2011 20:56 10/19/2011 05:29 10/19/2011 07:25 10/19/2011 11:42 10/19/2011 17:47 10/19/2011 21:20 10/20/2011 07:43 10/20/2011 11:46  Glucose-Capillary Latest Range: 70-99 mg/dL 409 (H)  811 (H) 914 (H) 266 (H) 237 (H) 109 (H) 284 (H)  Results for GABRIAL, DOMINE (MRN [id number]) as of 10/20/2011 14:58  Ref. Range 10/16/2011 09:28  Hemoglobin A1C Latest Range: <5.7 % 10.7 (H)     Sees Dr. Cleta Alberts for diabetes management. Discussed importance of controlling blood sugars at home.  Verbalized understanding and states he will f/u with PCP.

## 2011-10-20 NOTE — Discharge Summary (Signed)
Physician Discharge Summary  Tyrone Cunningham YNW:[id number] DOB: 1955/06/21 DOA: 10/16/2011  PCP: Lucilla Edin, MD  Admit date: 10/16/2011 Discharge date: 10/20/2011  Recommendations for Outpatient Follow-up:  1. Please check k level during outpatient follow up ( k of 5 on discharge and patient on lisinopril, aldactone and lasix) 2. Please check repeat CXR in 3-4 weeks to reevaluate   Discharge Diagnoses:  Principal Problem:  *COPD with acute exacerbation   Active Problems:  Uncontrolled diabetes mellitus  CHF (congestive heart failure), diastolic  Hypertension  Hypercholesteremia  ?Community acquired pneumonia  Tracheostomy dependent  OSA (obstructive sleep apnea)  Obesity   Discharge Condition: fair  Diet recommendation:  Low sodium and diabetic diet  Filed Weights   10/18/11 0543 10/19/11 0542 10/20/11 0624  Weight: 157.7 kg (347 lb 10.7 oz) 163.25 kg (359 lb 14.4 oz) 161.3 kg (355 lb 9.6 oz)    History of present illness:  56 y/o caucasian male with history of OSA and COPD presented with 10 days of  Increased SOB, Cough, albuterol use, and recent productive cough of green sputum. Has noticed that he has had increased WOB and SOB with activity. Denies any palpitations or hemoptysis. His albuterol helped initially but he has got worse despite increase in use of the albuterol. Went to his PCP who reportedly gave his prednisone and sent him to the ED for further evaluation. He denies any recent use of antibiotics.   Hospital Course:  *COPD with acute exacerbation  -Patient started on IV Solu-Medrol schedule nebs and Levaquin along with supplemental oxygen. -Initial symptoms on presentation was concerning for possible pneumonia however chest x-ray was not conclusive. -Blood culture was negative but tracheal aspirate showed moderate staph aureus ( MSSA) -Clinically improving and will be discharged on a course of by mouth prednisone and a total of 7 days of by mouth  Levaquin. -Appears that patient has not been compliant with his home had pain and spitting for along with his nebs and has been counseled to use his medications consistently -patient is trach dependent and necessary care provided per respiratory therapy.  Diabetes mellitus, uncontrolled with complications including neuropathy  -In the setting of dietary noncompliance. Patient also on steroid during hospitalization.  - patient  placed on novolog 10 units TID premeal and lantus dose increased to 90 units Q 24 hours  - appreciate diabetic education, diabetic consult  - A1c on this admission 10.7  - continue gabapentin  -counseled on dietary adherence and medication compliance.  CHF (congestive heart failure), chronic, grade I diastolic  - 2 D ECHO done on this admission with grossly normal LV function  -The home dose Lasix. He is clinically euvolemic  Hypertension  - BP stable - continue lasix   Hypercholesteremia  - continue atorvastatin    Hyponatremia  - likely diuretic induced  - sodium 131 today  - TSH within normal limits   Morbid obesity  -apreciate nutrition recommendations Patient has dietary non adherence.   She clinically stable to be discharged home with outpatient followup with his PCP in one week.    Procedures:  none  Consultations:  none  Discharge Exam: Filed Vitals:   10/20/11 0624  BP: 125/72  Pulse: 68  Temp: 97.8 F (36.6 C)  Resp: 16   Filed Vitals:   10/20/11 0032 10/20/11 0409 10/20/11 0624 10/20/11 0803  BP:   125/72   Pulse: 96 83 68   Temp:   97.8 F (36.6 C)   TempSrc:   Oral  Resp: 20 20 16    Height:      Weight:   161.3 kg (355 lb 9.6 oz)   SpO2: 98% 95% 99% 99%    General: Elderly morbidly obese male sitting up in bed in no acute distress HEENT: No pallor, moist oral mucosa, tracheostomy in place Cardiovascular: Normal S1 and S2 no murmurs rub or gallop Respiratory:  Clear breath sounds bilaterally no added  sounds Abd: soft, NT, nd, bs+ EXT: warm, no edema CNS: AAOX 3   Discharge Instructions  Discharge Orders    Future Appointments: Provider: Department: Dept Phone: Center:   11/25/2011 2:15 PM Collene Gobble, MD [facility] [phone] [facility]     Medication List  As of 10/20/2011 11:33 AM   TAKE these medications         albuterol (2.5 MG/3ML) 0.083% nebulizer solution   Commonly known as: PROVENTIL   Take 2.5 mg by nebulization every 4 (four) hours as needed. For shortness of breath.      albuterol 108 (90 BASE) MCG/ACT inhaler   Commonly known as: PROVENTIL HFA;VENTOLIN HFA   Inhale 2 puffs into the lungs every 4 (four) hours as needed. For shortness of breath.      atorvastatin 10 MG tablet   Commonly known as: LIPITOR   Take 10 mg by mouth daily.      B-D INS SYR ULTRAFINE 1CC/31G 31G X 5/16" 1 ML Misc   Generic drug: Insulin Syringe-Needle U-100   USE AS DIRECTED      Fluticasone-Salmeterol 500-50 MCG/DOSE Aepb   Commonly known as: ADVAIR   Inhale 1 puff into the lungs 2 (two) times daily.      furosemide 40 MG tablet   Commonly known as: LASIX   Take 40 mg by mouth daily.      gabapentin 300 MG capsule   Commonly known as: NEURONTIN   TAKE 1 CAPSULE BY MOUTH EVERY MORNING AND 2 CAPSULES BY MOUTH EVERY EVENING      HYDROcodone-acetaminophen 7.5-325 MG per tablet   Commonly known as: NORCO   Take 1 tablet by mouth every 4 (four) hours as needed. For pain.      insulin glargine 100 UNIT/ML injection   Commonly known as: LANTUS   Inject 90 Units into the skin daily.      insulin regular 100 units/mL injection   Commonly known as: NOVOLIN R,HUMULIN R   Inject 6-12 Units into the skin 3 (three) times daily before meals. Per sliding scale.      levofloxacin 750 MG tablet   Commonly known as: LEVAQUIN   Take 1 tablet (750 mg total) by mouth daily.( until 8/23)      lisinopril 5 MG tablet   Commonly known as: PRINIVIL,ZESTRIL   Take 5 mg by mouth daily.       permethrin 5 % cream   Commonly known as: ELIMITE   APPLY TO THE AFFECTED AREA ONCE      predniSONE 50 MG tablet   Commonly known as: DELTASONE   Take 1 tablet (50 mg total) by mouth daily.      psyllium 28 % packet   Commonly known as: METAMUCIL SMOOTH TEXTURE   Take 1 packet by mouth 2 (two) times daily.      SPIRIVA HANDIHALER 18 MCG inhalation capsule   Generic drug: tiotropium   INHALE 1 PUFF BY MOUTH EVERY DAY      spironolactone 25 MG tablet   Commonly known as: ALDACTONE  TAKE 1 TABLET BY MOUTH EVERY DAY           Follow-up Information    Follow up with DAUB, [name], MD in 1 week. (please check repeat k levels. also check repeat cxr in 4 weeks)    Contact information:   [address] [place] [address] [phone]           The results of significant diagnostics from this hospitalization (including imaging, microbiology, ancillary and laboratory) are listed below for reference.    Significant Diagnostic Studies: Dg Chest Portable 1 View  10/16/2011  *RADIOLOGY REPORT*  Clinical Data: Dyspnea  PORTABLE CHEST - 1 VIEW  Comparison: 03/21/2009  Findings: Cardiomediastinal silhouette is stable.  There is worsening in aeration with bilateral mild interstitial prominence and hazy airspace disease highly suspicious for bilateral pulmonary edema.  Tracheostomy tube is unchanged in position. Bilateral basilar atelectasis.  IMPRESSION: There is worsening in aeration with bilateral mild interstitial prominence and hazy airspace disease highly suspicious for bilateral pulmonary edema.  Tracheostomy tube is unchanged in position.  Original Report Authenticated By: Natasha Mead, M.D.    Microbiology: Recent Results (from the past 240 hour(s))  CULTURE, BLOOD (ROUTINE X 2)     Status: Normal (Preliminary result)   Collection Time   10/16/11  9:49 AM      Component Value Range Status Comment   Specimen Description BLOOD RIGHT HAND   Final    Special  Requests BOTTLES DRAWN AEROBIC AND ANAEROBIC 5CC   Final    Culture  Setup Time 10/16/2011 14:19   Final    Culture     Final    Value:        BLOOD CULTURE RECEIVED NO GROWTH TO DATE CULTURE WILL BE HELD FOR 5 DAYS BEFORE ISSUING A FINAL NEGATIVE REPORT   Report Status PENDING   Incomplete   CULTURE, BLOOD (ROUTINE X 2)     Status: Normal (Preliminary result)   Collection Time   10/16/11  9:56 AM      Component Value Range Status Comment   Specimen Description BLOOD LEFT HAND   Final    Special Requests BOTTLES DRAWN AEROBIC AND ANAEROBIC 4CC   Final    Culture  Setup Time 10/16/2011 14:19   Final    Culture     Final    Value:        BLOOD CULTURE RECEIVED NO GROWTH TO DATE CULTURE WILL BE HELD FOR 5 DAYS BEFORE ISSUING A FINAL NEGATIVE REPORT   Report Status PENDING   Incomplete   MRSA PCR SCREENING     Status: Normal   Collection Time   10/16/11  4:34 PM      Component Value Range Status Comment   MRSA by PCR NEGATIVE  NEGATIVE Final   CULTURE, RESPIRATORY     Status: Normal   Collection Time   10/17/11  5:15 AM      Component Value Range Status Comment   Specimen Description TRACHEAL ASPIRATE   Final    Special Requests NONE   Final    Gram Stain     Final    Value: NO WBC SEEN     NO SQUAMOUS EPITHELIAL CELLS SEEN     NO ORGANISMS SEEN   Culture     Final    Value: MODERATE STAPHYLOCOCCUS AUREUS     Note: RIFAMPIN AND GENTAMICIN SHOULD NOT BE USED AS SINGLE DRUGS FOR TREATMENT OF STAPH INFECTIONS.   Report Status  10/20/2011 FINAL   Final    Organism ID, Bacteria STAPHYLOCOCCUS AUREUS   Final      Labs: Basic Metabolic Panel:  Lab 10/19/11 6578 10/18/11 0531 10/17/11 1840 10/17/11 0645 10/16/11 0928  NA 131* 129* 130* 130* 133*  K 5.1 4.9 5.1 6.0* 4.5  CL 91* 88* 88* 92* 92*  CO2 37* 36* 37* 33* 31  GLUCOSE 267* 295* 213* 315* 290*  BUN 26* 30* 28* 28* 16  CREATININE 0.70 0.81 0.71 0.78 0.85  CALCIUM 9.8 9.5 9.4 8.7 9.1  MG -- -- -- -- --  PHOS -- -- -- -- --    Liver Function Tests: No results found for this basename: AST:5,ALT:5,ALKPHOS:5,BILITOT:5,PROT:5,ALBUMIN:5 in the last 168 hours No results found for this basename: LIPASE:5,AMYLASE:5 in the last 168 hours No results found for this basename: AMMONIA:5 in the last 168 hours CBC:  Lab 10/19/11 0529 10/18/11 0531 10/17/11 0645 10/16/11 0928 10/16/11 0854  WBC 13.0* 15.7* 15.3* 12.6* 13.2*  NEUTROABS -- -- -- 9.1* --  HGB 15.8 16.1 15.7 16.4 16.9  HCT 49.8 51.6 49.3 50.4 55.2*  MCV 89.7 90.7 90.0 89.4 91.9  PLT 202 218 214 208 --   Cardiac Enzymes:  Lab 10/17/11 0645 10/17/11 0012 10/16/11 1617 10/16/11 0928  CKTOTAL 68 73 81 --  CKMB 3.3 3.3 3.4 --  CKMBINDEX -- -- -- --  TROPONINI <0.30 <0.30 <0.30 <0.30   BNP: BNP (last 3 results)  Basename 10/16/11 0928  PROBNP 189.5*   CBG:  Lab 10/20/11 0743 10/19/11 2120 10/19/11 1747 10/19/11 1142 10/19/11 0725  GLUCAP 109* 237* 266* 380* 265*    Time coordinating discharge:45  minutes  Signed:  Angalina Ante  [facility] 10/20/2011, 11:33 AM

## 2011-10-20 NOTE — Care Management Note (Signed)
    Page 1 of 2   10/20/2011     1:49:37 PM   CARE MANAGEMENT NOTE 10/20/2011  Patient:  Tyrone Cunningham, Tyrone Cunningham   Account Number:  [id number]  Date Initiated:  10/17/2011  Documentation initiated by:  DAVIS,RHONDA  Subjective/Objective Assessment:   pt with multiple medical conditions has perm, trach in place o2 dropped down into the low 80's, diabetic, sent from mdo with o2 level     Action/Plan:   from home   Anticipated DC Date:  10/20/2011   Anticipated DC Plan:  HOME W HOME HEALTH SERVICES  In-house referral  NA      DC Planning Services  CM consult      Choice offered to / List presented to:  C-1 Patient   DME arranged  NA        HH arranged  HH-1 RN  HH-7 RESPIRATORY THERAPY      HH agency  [facility]   Status of service:  Completed, signed off Medicare Important Message given?  NA - LOS <3 / Initial given by admissions (If response is "NO", the following Medicare IM given date fields will be blank) Date Medicare IM given:   Date Additional Medicare IM given:    Discharge Disposition:  HOME W HOME HEALTH SERVICES  Per UR Regulation:  Reviewed for med. necessity/level of care/duration of stay  If discussed at Long Length of Stay Meetings, dates discussed:    Comments:  10/20/11 Tyrone Wandel RN,BSN NCM [phone] [facility] SUSAN DALE INFORMED OF D/C HOME W/HH,PATIENT HAS TRACH SUPPLIES,& CONTACTED [facility] DME LIASON(DARIN)ABOUT HOME TRAVEL TANK,& CONCERNS FOR CONCENTRATORS @ HOME,RESP THERAPY WILL EVAL FOR THOSE @ HOME.  10/18/11 1716 Leonie Green [phone] Cm spoke with patient concerning HH needs. Pt states receiving trach supplies from [facility]. Patient states had live-in caretaker up until a week ago. Patient states Lawanna [facility] provided home health aide until recent incident which resulted in him no longer choosing care from agency. Per pt choice [facility] to provide HHRN/Respiratory care/HHA upon discharge. Patient states friends to assist in home care & clean house this  Tuesday & Wednesday. Patient prefers [facility] to come later in week after house is cleaned. No other needs specified at this time. PCP: Lucilla Edin, MD.   [id number] Earlene Plater, RN, BSN, CCM: CHART REVIEWED AND UPDATED. NO DISCHARGE NEEDS PRESENT AT THIS TIME. CASE MANAGEMENT [phone]

## 2011-10-21 ENCOUNTER — Telehealth: Payer: Self-pay

## 2011-10-21 ENCOUNTER — Encounter: Payer: Self-pay | Admitting: Emergency Medicine

## 2011-10-21 DIAGNOSIS — I10 Essential (primary) hypertension: Secondary | ICD-10-CM | POA: Diagnosis not present

## 2011-10-21 DIAGNOSIS — I509 Heart failure, unspecified: Secondary | ICD-10-CM | POA: Diagnosis not present

## 2011-10-21 DIAGNOSIS — E1142 Type 2 diabetes mellitus with diabetic polyneuropathy: Secondary | ICD-10-CM | POA: Diagnosis not present

## 2011-10-21 DIAGNOSIS — E1149 Type 2 diabetes mellitus with other diabetic neurological complication: Secondary | ICD-10-CM | POA: Diagnosis not present

## 2011-10-21 DIAGNOSIS — J441 Chronic obstructive pulmonary disease with (acute) exacerbation: Secondary | ICD-10-CM | POA: Diagnosis not present

## 2011-10-21 DIAGNOSIS — G4733 Obstructive sleep apnea (adult) (pediatric): Secondary | ICD-10-CM | POA: Diagnosis not present

## 2011-10-21 NOTE — Telephone Encounter (Signed)
I called Kenney Houseman and could not leave message, sent through as a page.

## 2011-10-21 NOTE — Telephone Encounter (Signed)
Tanya from [facility] is calling to get some information about this pt who was just released from the hospital and is now in her care.  Best # [phone]

## 2011-10-21 NOTE — Telephone Encounter (Signed)
Tanya from [facility] is calling regarding pt.

## 2011-10-22 ENCOUNTER — Telehealth: Payer: Self-pay

## 2011-10-22 LAB — CULTURE, BLOOD (ROUTINE X 2): Culture: NO GROWTH

## 2011-10-22 NOTE — Telephone Encounter (Signed)
I think if we do get a Child psychotherapist to go out and do an assessment and he would  allow physical therapy to come to his house would be great. If he is agreeable to do this we can send a referral .

## 2011-10-22 NOTE — Telephone Encounter (Signed)
Pt called looking for medication refills.  Wants a return call to discuss.  [phone].

## 2011-10-22 NOTE — Telephone Encounter (Signed)
Dr. Cleta Alberts,  Archie Patten called back, states that she went to patients house and would like to send over orders for patient to have some additional help.  House was very dirty and cluttered, with no place to sit and patient did not appear to be sleeping on his bed.  He used to have help from [employer], but patient told her the government would need to assess his home for further help.  Patient would not let her touch his glucometer, or discuss his lack of test strips.  He said he knew how to take care of his DM.  Archie Patten would like to have PT and a home health aid ordered, but wondered what you thought about getting a Child psychotherapist to evaluate home?  Please advise and we can call her back.

## 2011-10-23 ENCOUNTER — Other Ambulatory Visit: Payer: Self-pay | Admitting: Physician Assistant

## 2011-10-23 ENCOUNTER — Other Ambulatory Visit: Payer: Self-pay

## 2011-10-23 MED ORDER — GABAPENTIN 300 MG PO CAPS: 300.0000 mg | ORAL_CAPSULE | Freq: Three times a day (TID) | ORAL | Status: DC

## 2011-10-23 MED ORDER — "INSULIN SYRINGE-NEEDLE U-100 31G X 5/16"" 1 ML MISC": Status: DC

## 2011-10-23 NOTE — Telephone Encounter (Signed)
Pt stated that someone has already called him and he thinks everything is OK w/his meds now. He will CB or have his pharm contact us if he needs anything.

## 2011-10-23 NOTE — Telephone Encounter (Signed)
Pt CB and stated that his pharm does not have a Rx for his 0.3 syringes for insulin and asked that we call in RFs. Called pharmacy and gave RFs for 1 year per protocol.

## 2011-10-24 NOTE — Telephone Encounter (Signed)
Tonya notified and she will put in order and have it faxed to [place] to sign.

## 2011-10-24 NOTE — Telephone Encounter (Signed)
Ok x 1

## 2011-10-25 DIAGNOSIS — H81399 Other peripheral vertigo, unspecified ear: Secondary | ICD-10-CM | POA: Diagnosis not present

## 2011-10-25 DIAGNOSIS — G541 Lumbosacral plexus disorders: Secondary | ICD-10-CM | POA: Diagnosis not present

## 2011-10-25 DIAGNOSIS — F4542 Pain disorder with related psychological factors: Secondary | ICD-10-CM | POA: Diagnosis not present

## 2011-10-25 DIAGNOSIS — F341 Dysthymic disorder: Secondary | ICD-10-CM | POA: Diagnosis not present

## 2011-10-29 ENCOUNTER — Telehealth: Payer: Self-pay | Admitting: Radiology

## 2011-10-29 ENCOUNTER — Ambulatory Visit (INDEPENDENT_AMBULATORY_CARE_PROVIDER_SITE_OTHER): Payer: Medicare Other | Admitting: Emergency Medicine

## 2011-10-29 ENCOUNTER — Ambulatory Visit: Payer: Medicare Other

## 2011-10-29 VITALS — Wt 353.0 lb

## 2011-10-29 DIAGNOSIS — E669 Obesity, unspecified: Secondary | ICD-10-CM

## 2011-10-29 DIAGNOSIS — I509 Heart failure, unspecified: Secondary | ICD-10-CM | POA: Diagnosis not present

## 2011-10-29 DIAGNOSIS — J209 Acute bronchitis, unspecified: Secondary | ICD-10-CM

## 2011-10-29 DIAGNOSIS — E1169 Type 2 diabetes mellitus with other specified complication: Secondary | ICD-10-CM

## 2011-10-29 DIAGNOSIS — J449 Chronic obstructive pulmonary disease, unspecified: Secondary | ICD-10-CM

## 2011-10-29 DIAGNOSIS — E119 Type 2 diabetes mellitus without complications: Secondary | ICD-10-CM | POA: Diagnosis not present

## 2011-10-29 LAB — POCT CBC
MCH, POC: 27.9 pg (ref 27–31.2)
MCV: 91.6 fL (ref 80–97)
MID (cbc): 0.8 (ref 0–0.9)
POC LYMPH PERCENT: 15.4 %L (ref 10–50)
Platelet Count, POC: 230 10*3/uL (ref 142–424)
RDW, POC: 18.5 %
WBC: 12.4 10*3/uL — AB (ref 4.6–10.2)

## 2011-10-29 LAB — BASIC METABOLIC PANEL
BUN: 21 mg/dL (ref 6–23)
Creat: 0.82 mg/dL (ref 0.50–1.35)

## 2011-10-29 LAB — GLUCOSE, POCT (MANUAL RESULT ENTRY): POC Glucose: 236 mg/dl — AB (ref 70–99)

## 2011-10-29 NOTE — Telephone Encounter (Signed)
Patient in need of portable oxygen equipment and hospital bed, these are ordered, he is currently a patient, can you get these items for him.

## 2011-10-29 NOTE — Progress Notes (Deleted)
  Subjective:    Patient ID: Tyrone Cunningham, male    DOB: 03/22/1955, 56 y.o.   MRN: [id number]  HPI    Review of Systems     Objective:   Physical Exam        Assessment & Plan:   

## 2011-10-29 NOTE — Progress Notes (Signed)
  Subjective:    Patient ID: Tyrone Cunningham, male    DOB: [birth year], 56 y.o.   MRN: [id number]  HPI patient here to follow up from recent hospitalization for acute on chronic CHF. His oxygen level was in the low 80's he now is back up to his baseline 92%. Patient requesting DME for hospital bed and new mattress the hospital bed needs new controls. He also needs smaller portable oxygen machine to take with him when he goes out. Patient requesting an appointment also with the pulmonologist, he has seen Dr Delford Field in the past. Referral made for this today.    Review of Systems     Objective:   Physical Exam  Patient much improved from last office visit. He is obese white male, heavy smoker.. his tracheostomy is in place. His cardiac exam is unremarkable. He is significantly diminished breath sounds in the bases but no audible wheezes. He has severe stasis changes of the lower extremities with 1+ to 2+ pitting edema.     Results for orders placed in visit on 10/29/11  POCT CBC      Component Value Range   WBC 12.4 (*) 4.6 - 10.2 K/uL   Lymph, poc 1.9  0.6 - 3.4   POC LYMPH PERCENT 15.4  10 - 50 %L   MID (cbc) 0.8  0 - 0.9   POC MID % 6.8  0 - 12 %M   POC Granulocyte 9.6 (*) 2 - 6.9   Granulocyte percent 77.8  37 - 80 %G   RBC 5.73  4.69 - 6.13 M/uL   Hemoglobin 16.0  14.1 - 18.1 g/dL   HCT, POC 40.9  81.1 - 53.7 %   MCV 91.6  80 - 97 fL   MCH, POC 27.9  27 - 31.2 pg   MCHC 30.5 (*) 31.8 - 35.4 g/dL   RDW, POC 91.4     Platelet Count, POC 230  142 - 424 K/uL   MPV 10.6  0 - 99.8 fL  GLUCOSE, POCT (MANUAL RESULT ENTRY)      Component Value Range   POC Glucose 236 (*) 70 - 99 mg/dl     [facility] reading (PRIMARY) by  Dr.Daub chest difficult to interpret due to patient's body size he does appear to be having increased markings in the bases with a borderline enlarged heart. There are no consolidated areas and there is no definite evidence of pleural effusions   Assessment & Plan:  Rx for  portable oxygen evaluation and new hospital bed and new mattress have been sent to [facility]. Appointment will be made with pulmonary for their assistance. He may need further cardiac evaluation. We'll try and see if we to get more assistance for him at home

## 2011-10-29 NOTE — Patient Instructions (Addendum)
Please discuss with the visiting nurse on Thursday about how we need to proceed with getting him in the hospital bed. [facility]. We'll also radiodigital portable oxygen. The week regarding her appointment with Dr. Delford Field. He will make a decision as to whether you need to get cardiology involved with your care. Continue same dose of Lantus . Continue to try and eat more healthfully. See me in one month. Continue on the Lasix one a day try and obtain a scale he can monitor your weight and if necessary he can take an extra dose of Lasix

## 2011-10-30 ENCOUNTER — Telehealth: Payer: Self-pay

## 2011-10-30 NOTE — Telephone Encounter (Signed)
Brooke called to report that patient does not have meter and scale to monitor oxygen.  She reported the home was being cleaned.   Patient needs a meter and scale.   Call back number:  [phone]

## 2011-10-30 NOTE — Telephone Encounter (Signed)
It would be fine to write orders for both of these.

## 2011-10-30 NOTE — Telephone Encounter (Signed)
Patient requesting an O2 sat monitor, he is also asking for a scale so he can weigh himself. Is it okay for these orders?

## 2011-10-31 NOTE — Telephone Encounter (Signed)
Nehemiah Settle, RN w/[facility] stated that we just need Rxs for these written so that pt can get from med supply store. Called pt and mailed him the Rxs as requested.

## 2011-11-03 ENCOUNTER — Telehealth: Payer: Self-pay

## 2011-11-03 DIAGNOSIS — Z930 Tracheostomy status: Secondary | ICD-10-CM

## 2011-11-03 DIAGNOSIS — G4730 Sleep apnea, unspecified: Secondary | ICD-10-CM

## 2011-11-03 NOTE — Telephone Encounter (Signed)
Dr Cleta Alberts, do you want to send in an order for a hospital bed for pt? See fax # below.

## 2011-11-03 NOTE — Telephone Encounter (Signed)
PATIENT STATES DR. DAUB NEEDED HIM TO CALL BACK WITH THE FAX NUMBER FOR [facility] SO THAT HE CAN GET A HOSPITAL BED. PLEASE CALL MR. Tyrone Cunningham WHEN IT HAS BEEN DONE. FAX # TO [facility] [phone].    PHONE # FOR MR. Tyrone Cunningham IS [phone]      MBC

## 2011-11-04 ENCOUNTER — Telehealth: Payer: Self-pay

## 2011-11-04 NOTE — Telephone Encounter (Signed)
Order faxed to [facility] and pt notified on VM that was done.

## 2011-11-04 NOTE — Telephone Encounter (Signed)
Natalie at [facility] needs a statement of medical necessity for the patient to have a bed.   Fax number [phone]

## 2011-11-04 NOTE — Telephone Encounter (Signed)
Dr. Ellis Parents notes are clear. From the phone message yesterday: "He suffers from morbid obesity associated with obstructive sleep apnea and has a tracheostomy placed. He must be positioned in the bed appropriately to prevent hypoxia."  Per the phone message yesterday, this was already addressed this afternoon.

## 2011-11-04 NOTE — Telephone Encounter (Signed)
Please place an order for an electrical hospital bed for the patient. He suffers from morbid obesity associated with obstructive sleep apnea and has a tracheostomy placed. He must be positioned in the bed appropriately to prevent hypoxia

## 2011-11-05 ENCOUNTER — Other Ambulatory Visit: Payer: Self-pay | Admitting: Emergency Medicine

## 2011-11-11 DIAGNOSIS — F341 Dysthymic disorder: Secondary | ICD-10-CM | POA: Diagnosis not present

## 2011-11-11 DIAGNOSIS — G541 Lumbosacral plexus disorders: Secondary | ICD-10-CM | POA: Diagnosis not present

## 2011-11-11 DIAGNOSIS — F4542 Pain disorder with related psychological factors: Secondary | ICD-10-CM | POA: Diagnosis not present

## 2011-11-11 DIAGNOSIS — H81399 Other peripheral vertigo, unspecified ear: Secondary | ICD-10-CM | POA: Diagnosis not present

## 2011-11-11 DIAGNOSIS — Z79899 Other long term (current) drug therapy: Secondary | ICD-10-CM | POA: Diagnosis not present

## 2011-11-11 DIAGNOSIS — M542 Cervicalgia: Secondary | ICD-10-CM | POA: Diagnosis not present

## 2011-11-13 NOTE — Progress Notes (Signed)
Completed form (Request for Independent Assessment for Personal Care Services) for [facility], signed by Dr Cleta Alberts, and faxed to [phone] with confirmation.

## 2011-11-20 ENCOUNTER — Other Ambulatory Visit: Payer: Self-pay | Admitting: Physician Assistant

## 2011-11-25 ENCOUNTER — Other Ambulatory Visit: Payer: Self-pay

## 2011-11-25 ENCOUNTER — Encounter: Payer: Self-pay | Admitting: Emergency Medicine

## 2011-11-25 ENCOUNTER — Ambulatory Visit (INDEPENDENT_AMBULATORY_CARE_PROVIDER_SITE_OTHER): Payer: Medicare Other | Admitting: Emergency Medicine

## 2011-11-25 ENCOUNTER — Other Ambulatory Visit: Payer: Self-pay | Admitting: Physician Assistant

## 2011-11-25 VITALS — BP 100/70 | HR 110 | Temp 99.0°F | Resp 20 | Ht 64.5 in | Wt 352.0 lb

## 2011-11-25 DIAGNOSIS — G4730 Sleep apnea, unspecified: Secondary | ICD-10-CM | POA: Diagnosis not present

## 2011-11-25 DIAGNOSIS — Z23 Encounter for immunization: Secondary | ICD-10-CM

## 2011-11-25 DIAGNOSIS — E119 Type 2 diabetes mellitus without complications: Secondary | ICD-10-CM

## 2011-11-25 LAB — GLUCOSE, POCT (MANUAL RESULT ENTRY): POC Glucose: 152 mg/dl — AB (ref 70–99)

## 2011-11-25 MED ORDER — LISINOPRIL 5 MG PO TABS: 5.0000 mg | ORAL_TABLET | Freq: Every day | ORAL | Status: DC

## 2011-11-25 NOTE — Progress Notes (Signed)
  Subjective:    Patient ID: Tyrone Cunningham, male    DOB: 1955/04/26, 56 y.o.   MRN: [id number]  HPI patient in for recheck he has felt well since his roommate left and moved down to [place]. He does not check his sugars at home. He has had no respiratory difficulty since his last visit here. He said home health come to visit with them and see if they can help with his situation. They're trying to get some assistance in the home to help with cleaning. Status has been good he has had no recent respiratory problems. He has already had his flu shot. Patient uses a combination of short acting Humulin insulin By sliding scale as well as Lantus for diabetes control.    Review of Systems  Constitutional: Negative.   HENT: Negative.   Respiratory: Positive for cough, shortness of breath and wheezing.   Cardiovascular: Positive for chest pain.  Gastrointestinal: Negative.   Musculoskeletal:       He has significant stasis changes of both lower extremities right greater than left pulses are present. He has 1+ edema. He had no lacerations present over his toes are bottom of the feet.  Neurological:       He does have some numbness in his feet       Objective:   Physical Exam HEENT exam is unremarkable. His tracheostomy in place. Breath sounds are diminished in the bases but no true wheezes. Cardiac exam tachycardia without murmurs rubs or gallops the examination was lower extremities reveals significant stasis changes of both legs but worse on the right.   Results for orders placed in visit on 11/25/11  GLUCOSE, POCT (MANUAL RESULT ENTRY)      Component Value Range   POC Glucose 152 (*) 70 - 99 mg/dl  POCT GLYCOSYLATED HEMOGLOBIN (HGB A1C)      Component Value Range   Hemoglobin A1C 8.9         Assessment & Plan:  Check sugar and hemoglobin A1c. We'll try and get increased assistance for him at home. Hemoglobin A1c is down to 8.9. This is down 1 point from his A1c 3 months ago. He is eating better  at home hopefully the home health nurses will help him on track with his diet.

## 2011-11-25 NOTE — Progress Notes (Deleted)
  Subjective:    Patient ID: Tyrone Cunningham, male    DOB: [birth year], 56 y.o.   MRN: [id number]  HPI    Review of Systems     Objective:   Physical Exam        Assessment & Plan:

## 2011-11-28 ENCOUNTER — Encounter: Payer: Self-pay | Admitting: Critical Care Medicine

## 2011-11-28 ENCOUNTER — Ambulatory Visit (INDEPENDENT_AMBULATORY_CARE_PROVIDER_SITE_OTHER): Payer: Medicare Other | Admitting: Critical Care Medicine

## 2011-11-28 VITALS — BP 122/60 | HR 118 | Temp 99.2°F | Ht 67.0 in | Wt 359.2 lb

## 2011-11-28 DIAGNOSIS — Z930 Tracheostomy status: Secondary | ICD-10-CM

## 2011-11-28 DIAGNOSIS — J449 Chronic obstructive pulmonary disease, unspecified: Secondary | ICD-10-CM | POA: Diagnosis not present

## 2011-11-28 DIAGNOSIS — J441 Chronic obstructive pulmonary disease with (acute) exacerbation: Secondary | ICD-10-CM

## 2011-11-28 MED ORDER — ALBUTEROL SULFATE (2.5 MG/3ML) 0.083% IN NEBU: 2.5000 mg | INHALATION_SOLUTION | Freq: Four times a day (QID) | RESPIRATORY_TRACT | Status: DC

## 2011-11-28 NOTE — Progress Notes (Signed)
Subjective:    Patient ID: Tyrone Cunningham, male    DOB: [birth year], 56 y.o.   MRN: [id number]  HPI 56 y.o. WM   Copd. Trach dependent.   Pt last seen PW 91yrs ago. Dr Cleta Alberts has been managing the pt.  MOre recently had to go to hosp one month ago for lung infection.   Pt exposed to passive smoke exposure.  Not much mucus out of trach.  Trach in place since 2001 for OSA.    No real chest pain.  Has not seen cardiology.  Pt notes some edema in legs.   Notes some dyspnea at rest.  Pt has oxygen at night.  [facility] is DME.    Past Medical History  Diagnosis Date  . Bronchitis   . CHF (congestive heart failure)   . DM type 2 with diabetic peripheral neuropathy   . Hypertension   . Hypercholesteremia   . Hearing loss   . Peripheral neuropathy   . Sleep apnea     Severe sleep apnea requiring tracheostomy  . COPD (chronic obstructive pulmonary disease)      Family History  Problem Relation Age of Onset  . Coronary artery disease Father      History   Social History  . Marital Status: Single    Spouse Name: N/A    Number of Children: N/A  . Years of Education: N/A   Occupational History  . Not on file.   Social History Main Topics  . Smoking status: Former Smoker -- 2.0 packs/day for 30 years    Types: Cigarettes    Quit date: 07/02/1999  . Smokeless tobacco: Never Used  . Alcohol Use: No  . Drug Use: No  . Sexually Active: No   Other Topics Concern  . Not on file   Social History Narrative  . No narrative on file     No Known Allergies   Outpatient Prescriptions Prior to Visit  Medication Sig Dispense Refill  . ADVAIR DISKUS 500-50 MCG/DOSE AEPB INHALE 1 PUFF BY MOUTH TWICE DAILY  14 each  5  . albuterol (PROVENTIL HFA;VENTOLIN HFA) 108 (90 BASE) MCG/ACT inhaler Inhale 2 puffs into the lungs every 4 (four) hours as needed. For shortness of breath.      Marland Kitchen atorvastatin (LIPITOR) 10 MG tablet TAKE 1 TABLET BY MOUTH DAILY  90 tablet  0  . HYDROcodone-acetaminophen (NORCO)  7.5-325 MG per tablet Take 1 tablet by mouth every 4 (four) hours as needed. For pain.      Marland Kitchen insulin glargine (LANTUS) 100 UNIT/ML injection Inject 90 Units into the skin daily.  30 mL  1  . insulin regular (NOVOLIN R,HUMULIN R) 100 units/mL injection Inject 6-12 Units into the skin 3 (three) times daily before meals. Per sliding scale.      . Insulin Syringe-Needle U-100 (B-D INS SYR ULTRAFINE 1CC/31G) 31G X 5/16" 1 ML MISC Use as Directed  100 each  1  . permethrin (ELIMITE) 5 % cream APPLY TO THE AFFECTED AREA ONCE  60 g  0  . polyethylene glycol powder (GLYCOLAX/MIRALAX) powder ADD 1 CUP OF POWDER IN 8 OUNCES OF WATER EVERY DAY  255 g  3  . SPIRIVA HANDIHALER 18 MCG inhalation capsule INHALE 1 PUFF BY MOUTH EVERY DAY  30 capsule  5  . spironolactone (ALDACTONE) 25 MG tablet TAKE 1 TABLET BY MOUTH EVERY DAY  30 tablet  2  . albuterol (PROVENTIL) (2.5 MG/3ML) 0.083% nebulizer solution Take 2.5 mg by nebulization.  5 x daily For shortness of breath.      Marland Kitchen atorvastatin (LIPITOR) 10 MG tablet Take 10 mg by mouth daily.      . Fluticasone-Salmeterol (ADVAIR DISKUS) 500-50 MCG/DOSE AEPB Inhale 1 puff into the lungs 2 (two) times daily.  1 each  11  . furosemide (LASIX) 40 MG tablet See Notes  60 tablet  0  . gabapentin (NEURONTIN) 300 MG capsule Take 1 capsule (300 mg total) by mouth 3 (three) times daily.  90 capsule  1  . NOVOLIN R 100 UNIT/ML injection INJECT 6 TO 12 UNITS UNDER THE SKIN EVERY DAY AS DIRECTED PER SLIDING SCALE  30 mL  0  . lisinopril (PRINIVIL,ZESTRIL) 5 MG tablet Take 1 tablet (5 mg total) by mouth daily.  90 tablet  0  . psyllium (METAMUCIL SMOOTH TEXTURE) 28 % packet Take 1 packet by mouth 2 (two) times daily.         Review of Systems  Constitutional: Positive for fatigue. Negative for fever, chills, diaphoresis, activity change, appetite change and unexpected weight change.  HENT: Positive for congestion, rhinorrhea, sneezing and dental problem. Negative for hearing loss,  ear pain, nosebleeds, sore throat, facial swelling, mouth sores, trouble swallowing, neck pain, neck stiffness, voice change, postnasal drip, sinus pressure, tinnitus and ear discharge.   Eyes: Negative for photophobia, discharge, itching and visual disturbance.  Respiratory: Positive for cough and shortness of breath. Negative for apnea, choking, chest tightness, wheezing and stridor.   Cardiovascular: Positive for chest pain and palpitations. Negative for leg swelling.  Gastrointestinal: Negative for nausea, vomiting, abdominal pain, constipation, blood in stool and abdominal distention.  Genitourinary: Negative for dysuria, urgency, frequency, hematuria, flank pain, decreased urine volume and difficulty urinating.  Musculoskeletal: Positive for back pain and gait problem. Negative for myalgias, joint swelling and arthralgias.  Skin: Negative for color change, pallor and rash.  Neurological: Negative for dizziness, tremors, seizures, syncope, speech difficulty, weakness, light-headedness, numbness and headaches.  Hematological: Negative for adenopathy. Does not bruise/bleed easily.  Psychiatric/Behavioral: Positive for disturbed wake/sleep cycle. Negative for confusion and agitation. The patient is not nervous/anxious.        Objective:   Physical Exam Filed Vitals:   11/28/11 1629  BP: 122/60  Pulse: 118  Temp: 99.2 F (37.3 C)  TempSrc: Oral  Height: 5\' 7"  (1.702 m)  Weight: 359 lb 3.2 oz (162.932 kg)  SpO2: 90%    Gen: obese , well-nourished, in no distress,  normal affect,metal  trach in place  ENT: No lesions,  mouth clear,  oropharynx clear, no postnasal drip  Neck: No JVD, no TMG, no carotid bruits  Lungs: No use of accessory muscles, no dullness to percussion, distant BS  Cardiovascular: RRR, heart sounds normal, no murmur or gallops, 2+  peripheral edema  Abdomen: soft and NT, no HSM,  BS normal  Musculoskeletal: No deformities, no cyanosis or clubbing  Neuro:  alert, non focal  Skin: Warm, no lesions or rashes  No results found.        Assessment & Plan:   COPD with acute exacerbation Copd Golds D oxygen dependent.  Severe OHS trach dependent Plan Stop ipratroprium in nebulizer Change albuterol to 4 times daily in nebulizer Stay on Advair and Spiriva Continue to use oxygen as before Return for recheck in 2 months    Updated Medication List Outpatient Encounter Prescriptions as of 11/28/2011  Medication Sig Dispense Refill  . ADVAIR DISKUS 500-50 MCG/DOSE AEPB INHALE 1 PUFF BY MOUTH TWICE  DAILY  14 each  5  . albuterol (PROVENTIL HFA;VENTOLIN HFA) 108 (90 BASE) MCG/ACT inhaler Inhale 2 puffs into the lungs every 4 (four) hours as needed. For shortness of breath.      Marland Kitchen albuterol (PROVENTIL) (2.5 MG/3ML) 0.083% nebulizer solution Take 3 mLs (2.5 mg total) by nebulization 4 (four) times daily.  75 mL    . atorvastatin (LIPITOR) 10 MG tablet TAKE 1 TABLET BY MOUTH DAILY  90 tablet  0  . furosemide (LASIX) 40 MG tablet Take 20 mg by mouth 2 (two) times daily.       Marland Kitchen gabapentin (NEURONTIN) 300 MG capsule 2 capsules in the morning and 1 in the evening      . HYDROcodone-acetaminophen (NORCO) 7.5-325 MG per tablet Take 1 tablet by mouth every 4 (four) hours as needed. For pain.      Marland Kitchen insulin glargine (LANTUS) 100 UNIT/ML injection Inject 90 Units into the skin daily.  30 mL  1  . insulin regular (NOVOLIN R,HUMULIN R) 100 units/mL injection Inject 6-12 Units into the skin 3 (three) times daily before meals. Per sliding scale.      . Insulin Syringe-Needle U-100 (B-D INS SYR ULTRAFINE 1CC/31G) 31G X 5/16" 1 ML MISC Use as Directed  100 each  1  . lisinopril (PRINIVIL,ZESTRIL) 10 MG tablet Take 10 mg by mouth daily.      . Multiple Vitamin (MULTIVITAMIN) tablet Take 1 tablet by mouth daily.      . permethrin (ELIMITE) 5 % cream APPLY TO THE AFFECTED AREA ONCE  60 g  0  . polyethylene glycol powder (GLYCOLAX/MIRALAX) powder ADD 1 CUP OF POWDER  IN 8 OUNCES OF WATER EVERY DAY  255 g  3  . SPIRIVA HANDIHALER 18 MCG inhalation capsule INHALE 1 PUFF BY MOUTH EVERY DAY  30 capsule  5  . spironolactone (ALDACTONE) 25 MG tablet TAKE 1 TABLET BY MOUTH EVERY DAY  30 tablet  2  . DISCONTD: albuterol (PROVENTIL) (2.5 MG/3ML) 0.083% nebulizer solution Take 2.5 mg by nebulization. 5 x daily For shortness of breath.      . DISCONTD: atorvastatin (LIPITOR) 10 MG tablet Take 10 mg by mouth daily.      Marland Kitchen DISCONTD: Fluticasone-Salmeterol (ADVAIR DISKUS) 500-50 MCG/DOSE AEPB Inhale 1 puff into the lungs 2 (two) times daily.  1 each  11  . DISCONTD: furosemide (LASIX) 40 MG tablet See Notes  60 tablet  0  . DISCONTD: gabapentin (NEURONTIN) 300 MG capsule Take 1 capsule (300 mg total) by mouth 3 (three) times daily.  90 capsule  1  . DISCONTD: ipratropium (ATROVENT) 0.02 % nebulizer solution Take 500 mcg by nebulization. Five times daily with albuterol      . DISCONTD: NOVOLIN R 100 UNIT/ML injection INJECT 6 TO 12 UNITS UNDER THE SKIN EVERY DAY AS DIRECTED PER SLIDING SCALE  30 mL  0  . DISCONTD: lisinopril (PRINIVIL,ZESTRIL) 5 MG tablet Take 1 tablet (5 mg total) by mouth daily.  90 tablet  0  . DISCONTD: psyllium (METAMUCIL SMOOTH TEXTURE) 28 % packet Take 1 packet by mouth 2 (two) times daily.

## 2011-11-28 NOTE — Patient Instructions (Addendum)
Stop ipratroprium in nebulizer Change albuterol to 4 times daily in nebulizer Stay on Advair and Spiriva Continue to use oxygen as before Return for recheck in 2 months

## 2011-11-30 NOTE — Assessment & Plan Note (Signed)
Copd Golds D oxygen dependent.  Severe OHS trach dependent Plan Stop ipratroprium in nebulizer Change albuterol to 4 times daily in nebulizer Stay on Advair and Spiriva Continue to use oxygen as before Return for recheck in 2 months

## 2011-12-02 DIAGNOSIS — Z930 Tracheostomy status: Secondary | ICD-10-CM | POA: Diagnosis not present

## 2011-12-09 DIAGNOSIS — F4542 Pain disorder with related psychological factors: Secondary | ICD-10-CM | POA: Diagnosis not present

## 2011-12-09 DIAGNOSIS — F341 Dysthymic disorder: Secondary | ICD-10-CM | POA: Diagnosis not present

## 2011-12-09 DIAGNOSIS — G541 Lumbosacral plexus disorders: Secondary | ICD-10-CM | POA: Diagnosis not present

## 2011-12-09 DIAGNOSIS — H81399 Other peripheral vertigo, unspecified ear: Secondary | ICD-10-CM | POA: Diagnosis not present

## 2011-12-17 ENCOUNTER — Telehealth: Payer: Self-pay

## 2011-12-17 NOTE — Telephone Encounter (Signed)
Charlyn Minerva, PT with [facility] needs verbal order for recert for patient's [facility] PT since his 60 days have expired.  Please call Mr. Nunzio Cory at [phone].

## 2011-12-18 NOTE — Telephone Encounter (Signed)
Dr Cleta Alberts has indicated to continue this for him, I have called Maurine Minister with [facility] to advise okay to continue home PT

## 2011-12-20 DIAGNOSIS — I10 Essential (primary) hypertension: Secondary | ICD-10-CM | POA: Diagnosis not present

## 2011-12-20 DIAGNOSIS — E1142 Type 2 diabetes mellitus with diabetic polyneuropathy: Secondary | ICD-10-CM | POA: Diagnosis not present

## 2011-12-20 DIAGNOSIS — I509 Heart failure, unspecified: Secondary | ICD-10-CM | POA: Diagnosis not present

## 2011-12-20 DIAGNOSIS — E1149 Type 2 diabetes mellitus with other diabetic neurological complication: Secondary | ICD-10-CM | POA: Diagnosis not present

## 2011-12-20 DIAGNOSIS — G4733 Obstructive sleep apnea (adult) (pediatric): Secondary | ICD-10-CM | POA: Diagnosis not present

## 2011-12-20 DIAGNOSIS — J441 Chronic obstructive pulmonary disease with (acute) exacerbation: Secondary | ICD-10-CM | POA: Diagnosis not present

## 2011-12-22 ENCOUNTER — Telehealth: Payer: Self-pay

## 2011-12-22 DIAGNOSIS — E1149 Type 2 diabetes mellitus with other diabetic neurological complication: Secondary | ICD-10-CM | POA: Diagnosis not present

## 2011-12-22 DIAGNOSIS — G4733 Obstructive sleep apnea (adult) (pediatric): Secondary | ICD-10-CM | POA: Diagnosis not present

## 2011-12-22 DIAGNOSIS — J441 Chronic obstructive pulmonary disease with (acute) exacerbation: Secondary | ICD-10-CM | POA: Diagnosis not present

## 2011-12-22 DIAGNOSIS — I509 Heart failure, unspecified: Secondary | ICD-10-CM | POA: Diagnosis not present

## 2011-12-22 DIAGNOSIS — E1142 Type 2 diabetes mellitus with diabetic polyneuropathy: Secondary | ICD-10-CM | POA: Diagnosis not present

## 2011-12-22 DIAGNOSIS — I10 Essential (primary) hypertension: Secondary | ICD-10-CM | POA: Diagnosis not present

## 2011-12-22 NOTE — Telephone Encounter (Signed)
Verbal order given. Tyrone Cunningham

## 2011-12-22 NOTE — Telephone Encounter (Signed)
Charlyn Minerva, physical therapist at [facility], is requesting Dr Cleta Alberts to authorize a nurse for disease teaching on diabetes and congestive heart failure, a home health nurse to assist with bathing, and an OT evaluation for this patient. I informed him Dr Cleta Alberts was out of the country until Monday and he would like a PA or another provider to approve this as soon as possible.  Best # for Charlyn Minerva [phone] at [facility]

## 2011-12-22 NOTE — Telephone Encounter (Signed)
I will approve/authorize. What do I need to do?

## 2011-12-24 ENCOUNTER — Telehealth: Payer: Self-pay

## 2011-12-24 DIAGNOSIS — E1142 Type 2 diabetes mellitus with diabetic polyneuropathy: Secondary | ICD-10-CM | POA: Diagnosis not present

## 2011-12-24 DIAGNOSIS — I10 Essential (primary) hypertension: Secondary | ICD-10-CM | POA: Diagnosis not present

## 2011-12-24 DIAGNOSIS — I509 Heart failure, unspecified: Secondary | ICD-10-CM | POA: Diagnosis not present

## 2011-12-24 DIAGNOSIS — G4733 Obstructive sleep apnea (adult) (pediatric): Secondary | ICD-10-CM | POA: Diagnosis not present

## 2011-12-24 DIAGNOSIS — J441 Chronic obstructive pulmonary disease with (acute) exacerbation: Secondary | ICD-10-CM | POA: Diagnosis not present

## 2011-12-24 DIAGNOSIS — E1149 Type 2 diabetes mellitus with other diabetic neurological complication: Secondary | ICD-10-CM | POA: Diagnosis not present

## 2011-12-24 NOTE — Telephone Encounter (Signed)
Ok to give verbal order.

## 2011-12-24 NOTE — Telephone Encounter (Signed)
Patient has declined nursing. He is asking for only home health aide for bathing. Has been advised by [facility] they can not do this for him as requested. I will advise Dr Cleta Alberts of situation when he returns to clinic.

## 2011-12-24 NOTE — Telephone Encounter (Signed)
Candee Furbish, RN from [facility] called asking for orders for nursing visits to monitor/teach DM and CHF for pt 2 x wk for 3 wks and then one add'l visit 3 weeks later. They will put him on a telemonitoring system which will monitor pt's weight/BP QD and contact us if it detects a problem. They would also like orders for home health aid for 2 x wk to help w/bathing. Is it ok for me to give them a verbal order for this?

## 2011-12-27 DIAGNOSIS — I10 Essential (primary) hypertension: Secondary | ICD-10-CM | POA: Diagnosis not present

## 2011-12-27 DIAGNOSIS — J441 Chronic obstructive pulmonary disease with (acute) exacerbation: Secondary | ICD-10-CM | POA: Diagnosis not present

## 2011-12-27 DIAGNOSIS — G4733 Obstructive sleep apnea (adult) (pediatric): Secondary | ICD-10-CM | POA: Diagnosis not present

## 2011-12-27 DIAGNOSIS — E1149 Type 2 diabetes mellitus with other diabetic neurological complication: Secondary | ICD-10-CM | POA: Diagnosis not present

## 2011-12-27 DIAGNOSIS — I509 Heart failure, unspecified: Secondary | ICD-10-CM | POA: Diagnosis not present

## 2011-12-27 DIAGNOSIS — E1142 Type 2 diabetes mellitus with diabetic polyneuropathy: Secondary | ICD-10-CM | POA: Diagnosis not present

## 2011-12-29 DIAGNOSIS — G4733 Obstructive sleep apnea (adult) (pediatric): Secondary | ICD-10-CM | POA: Diagnosis not present

## 2011-12-29 DIAGNOSIS — J441 Chronic obstructive pulmonary disease with (acute) exacerbation: Secondary | ICD-10-CM | POA: Diagnosis not present

## 2011-12-29 DIAGNOSIS — E1149 Type 2 diabetes mellitus with other diabetic neurological complication: Secondary | ICD-10-CM | POA: Diagnosis not present

## 2011-12-29 DIAGNOSIS — I10 Essential (primary) hypertension: Secondary | ICD-10-CM | POA: Diagnosis not present

## 2011-12-29 DIAGNOSIS — I509 Heart failure, unspecified: Secondary | ICD-10-CM | POA: Diagnosis not present

## 2011-12-29 DIAGNOSIS — E1142 Type 2 diabetes mellitus with diabetic polyneuropathy: Secondary | ICD-10-CM | POA: Diagnosis not present

## 2011-12-30 ENCOUNTER — Telehealth: Payer: Self-pay

## 2011-12-30 MED ORDER — GABAPENTIN 300 MG PO CAPS: 300.0000 mg | ORAL_CAPSULE | Freq: Two times a day (BID) | ORAL | Status: DC

## 2011-12-30 MED ORDER — INSULIN REGULAR HUMAN 100 UNIT/ML IJ SOLN: 6.0000 [IU] | Freq: Three times a day (TID) | INTRAMUSCULAR | Status: DC

## 2011-12-30 NOTE — Telephone Encounter (Signed)
Neurontin sent to pharmacy 

## 2011-12-30 NOTE — Telephone Encounter (Signed)
Spoke with pt, needs a refill on Neurontin. Pt states pharmacy has faxed this request several times. Please advise

## 2011-12-30 NOTE — Telephone Encounter (Signed)
Called pt informed RX sent to pharmacy. I sent in a moths worth of his Novalin per pt request.

## 2011-12-31 DIAGNOSIS — I509 Heart failure, unspecified: Secondary | ICD-10-CM | POA: Diagnosis not present

## 2011-12-31 DIAGNOSIS — G4733 Obstructive sleep apnea (adult) (pediatric): Secondary | ICD-10-CM | POA: Diagnosis not present

## 2011-12-31 DIAGNOSIS — J441 Chronic obstructive pulmonary disease with (acute) exacerbation: Secondary | ICD-10-CM | POA: Diagnosis not present

## 2011-12-31 DIAGNOSIS — E1149 Type 2 diabetes mellitus with other diabetic neurological complication: Secondary | ICD-10-CM | POA: Diagnosis not present

## 2011-12-31 DIAGNOSIS — I10 Essential (primary) hypertension: Secondary | ICD-10-CM | POA: Diagnosis not present

## 2011-12-31 DIAGNOSIS — E1142 Type 2 diabetes mellitus with diabetic polyneuropathy: Secondary | ICD-10-CM | POA: Diagnosis not present

## 2012-01-02 DIAGNOSIS — J441 Chronic obstructive pulmonary disease with (acute) exacerbation: Secondary | ICD-10-CM | POA: Diagnosis not present

## 2012-01-02 DIAGNOSIS — I10 Essential (primary) hypertension: Secondary | ICD-10-CM | POA: Diagnosis not present

## 2012-01-02 DIAGNOSIS — G4733 Obstructive sleep apnea (adult) (pediatric): Secondary | ICD-10-CM | POA: Diagnosis not present

## 2012-01-02 DIAGNOSIS — I509 Heart failure, unspecified: Secondary | ICD-10-CM | POA: Diagnosis not present

## 2012-01-02 DIAGNOSIS — E1149 Type 2 diabetes mellitus with other diabetic neurological complication: Secondary | ICD-10-CM | POA: Diagnosis not present

## 2012-01-02 DIAGNOSIS — E1142 Type 2 diabetes mellitus with diabetic polyneuropathy: Secondary | ICD-10-CM | POA: Diagnosis not present

## 2012-01-05 DIAGNOSIS — I509 Heart failure, unspecified: Secondary | ICD-10-CM | POA: Diagnosis not present

## 2012-01-05 DIAGNOSIS — E1142 Type 2 diabetes mellitus with diabetic polyneuropathy: Secondary | ICD-10-CM | POA: Diagnosis not present

## 2012-01-05 DIAGNOSIS — G4733 Obstructive sleep apnea (adult) (pediatric): Secondary | ICD-10-CM | POA: Diagnosis not present

## 2012-01-05 DIAGNOSIS — J441 Chronic obstructive pulmonary disease with (acute) exacerbation: Secondary | ICD-10-CM | POA: Diagnosis not present

## 2012-01-05 DIAGNOSIS — E1149 Type 2 diabetes mellitus with other diabetic neurological complication: Secondary | ICD-10-CM | POA: Diagnosis not present

## 2012-01-05 DIAGNOSIS — I10 Essential (primary) hypertension: Secondary | ICD-10-CM | POA: Diagnosis not present

## 2012-01-07 DIAGNOSIS — J441 Chronic obstructive pulmonary disease with (acute) exacerbation: Secondary | ICD-10-CM | POA: Diagnosis not present

## 2012-01-07 DIAGNOSIS — E1149 Type 2 diabetes mellitus with other diabetic neurological complication: Secondary | ICD-10-CM | POA: Diagnosis not present

## 2012-01-07 DIAGNOSIS — I10 Essential (primary) hypertension: Secondary | ICD-10-CM | POA: Diagnosis not present

## 2012-01-07 DIAGNOSIS — E1142 Type 2 diabetes mellitus with diabetic polyneuropathy: Secondary | ICD-10-CM | POA: Diagnosis not present

## 2012-01-07 DIAGNOSIS — I509 Heart failure, unspecified: Secondary | ICD-10-CM | POA: Diagnosis not present

## 2012-01-07 DIAGNOSIS — G4733 Obstructive sleep apnea (adult) (pediatric): Secondary | ICD-10-CM | POA: Diagnosis not present

## 2012-01-08 DIAGNOSIS — G541 Lumbosacral plexus disorders: Secondary | ICD-10-CM | POA: Diagnosis not present

## 2012-01-08 DIAGNOSIS — F341 Dysthymic disorder: Secondary | ICD-10-CM | POA: Diagnosis not present

## 2012-01-08 DIAGNOSIS — H81399 Other peripheral vertigo, unspecified ear: Secondary | ICD-10-CM | POA: Diagnosis not present

## 2012-01-08 DIAGNOSIS — F4542 Pain disorder with related psychological factors: Secondary | ICD-10-CM | POA: Diagnosis not present

## 2012-01-09 DIAGNOSIS — E1149 Type 2 diabetes mellitus with other diabetic neurological complication: Secondary | ICD-10-CM | POA: Diagnosis not present

## 2012-01-09 DIAGNOSIS — I10 Essential (primary) hypertension: Secondary | ICD-10-CM | POA: Diagnosis not present

## 2012-01-09 DIAGNOSIS — G4733 Obstructive sleep apnea (adult) (pediatric): Secondary | ICD-10-CM | POA: Diagnosis not present

## 2012-01-09 DIAGNOSIS — E1142 Type 2 diabetes mellitus with diabetic polyneuropathy: Secondary | ICD-10-CM | POA: Diagnosis not present

## 2012-01-09 DIAGNOSIS — J441 Chronic obstructive pulmonary disease with (acute) exacerbation: Secondary | ICD-10-CM | POA: Diagnosis not present

## 2012-01-09 DIAGNOSIS — I509 Heart failure, unspecified: Secondary | ICD-10-CM | POA: Diagnosis not present

## 2012-01-12 DIAGNOSIS — E1149 Type 2 diabetes mellitus with other diabetic neurological complication: Secondary | ICD-10-CM | POA: Diagnosis not present

## 2012-01-12 DIAGNOSIS — J441 Chronic obstructive pulmonary disease with (acute) exacerbation: Secondary | ICD-10-CM | POA: Diagnosis not present

## 2012-01-12 DIAGNOSIS — I10 Essential (primary) hypertension: Secondary | ICD-10-CM | POA: Diagnosis not present

## 2012-01-12 DIAGNOSIS — I509 Heart failure, unspecified: Secondary | ICD-10-CM | POA: Diagnosis not present

## 2012-01-12 DIAGNOSIS — E1142 Type 2 diabetes mellitus with diabetic polyneuropathy: Secondary | ICD-10-CM | POA: Diagnosis not present

## 2012-01-12 DIAGNOSIS — G4733 Obstructive sleep apnea (adult) (pediatric): Secondary | ICD-10-CM | POA: Diagnosis not present

## 2012-01-14 ENCOUNTER — Other Ambulatory Visit: Payer: Self-pay | Admitting: Physician Assistant

## 2012-01-14 DIAGNOSIS — E1149 Type 2 diabetes mellitus with other diabetic neurological complication: Secondary | ICD-10-CM | POA: Diagnosis not present

## 2012-01-14 DIAGNOSIS — E1142 Type 2 diabetes mellitus with diabetic polyneuropathy: Secondary | ICD-10-CM | POA: Diagnosis not present

## 2012-01-14 DIAGNOSIS — J441 Chronic obstructive pulmonary disease with (acute) exacerbation: Secondary | ICD-10-CM | POA: Diagnosis not present

## 2012-01-14 DIAGNOSIS — G4733 Obstructive sleep apnea (adult) (pediatric): Secondary | ICD-10-CM | POA: Diagnosis not present

## 2012-01-14 DIAGNOSIS — I10 Essential (primary) hypertension: Secondary | ICD-10-CM | POA: Diagnosis not present

## 2012-01-14 DIAGNOSIS — I509 Heart failure, unspecified: Secondary | ICD-10-CM | POA: Diagnosis not present

## 2012-01-16 ENCOUNTER — Telehealth: Payer: Self-pay

## 2012-01-16 ENCOUNTER — Encounter: Payer: Self-pay | Admitting: Radiology

## 2012-01-16 NOTE — Telephone Encounter (Signed)
PATIENT CALLED IN REGARDS TO RX REFILL REQUEST FOR NOVOLIN AND LANTUS FOR 3 VILES INSTEAD OF ONE. PLEASE CALL PATIENT BACK IF THIS IS POSSIBLE. THANK YOU!

## 2012-01-17 ENCOUNTER — Telehealth: Payer: Self-pay

## 2012-01-17 NOTE — Telephone Encounter (Signed)
Spoke with pt and called in Lantus with 1 refill.

## 2012-01-19 DIAGNOSIS — J441 Chronic obstructive pulmonary disease with (acute) exacerbation: Secondary | ICD-10-CM | POA: Diagnosis not present

## 2012-01-19 DIAGNOSIS — I509 Heart failure, unspecified: Secondary | ICD-10-CM | POA: Diagnosis not present

## 2012-01-19 DIAGNOSIS — G4733 Obstructive sleep apnea (adult) (pediatric): Secondary | ICD-10-CM | POA: Diagnosis not present

## 2012-01-19 DIAGNOSIS — I10 Essential (primary) hypertension: Secondary | ICD-10-CM | POA: Diagnosis not present

## 2012-01-19 DIAGNOSIS — E1142 Type 2 diabetes mellitus with diabetic polyneuropathy: Secondary | ICD-10-CM | POA: Diagnosis not present

## 2012-01-19 DIAGNOSIS — E1149 Type 2 diabetes mellitus with other diabetic neurological complication: Secondary | ICD-10-CM | POA: Diagnosis not present

## 2012-01-21 DIAGNOSIS — M542 Cervicalgia: Secondary | ICD-10-CM | POA: Diagnosis not present

## 2012-01-21 DIAGNOSIS — Z79899 Other long term (current) drug therapy: Secondary | ICD-10-CM | POA: Diagnosis not present

## 2012-01-21 DIAGNOSIS — F341 Dysthymic disorder: Secondary | ICD-10-CM | POA: Diagnosis not present

## 2012-01-21 DIAGNOSIS — F4542 Pain disorder with related psychological factors: Secondary | ICD-10-CM | POA: Diagnosis not present

## 2012-01-21 DIAGNOSIS — H81399 Other peripheral vertigo, unspecified ear: Secondary | ICD-10-CM | POA: Diagnosis not present

## 2012-01-21 DIAGNOSIS — G541 Lumbosacral plexus disorders: Secondary | ICD-10-CM | POA: Diagnosis not present

## 2012-01-22 ENCOUNTER — Telehealth: Payer: Self-pay | Admitting: *Deleted

## 2012-01-22 MED ORDER — TIOTROPIUM BROMIDE MONOHYDRATE 18 MCG IN CAPS: 18.0000 ug | ORAL_CAPSULE | Freq: Every day | RESPIRATORY_TRACT | Status: DC

## 2012-01-22 NOTE — Telephone Encounter (Signed)
Pharmacy requesting refill on spiriva.  Last filled on 12/20/2011

## 2012-01-22 NOTE — Telephone Encounter (Signed)
Sent!

## 2012-01-23 ENCOUNTER — Ambulatory Visit (INDEPENDENT_AMBULATORY_CARE_PROVIDER_SITE_OTHER): Payer: Medicare Other | Admitting: Critical Care Medicine

## 2012-01-23 ENCOUNTER — Encounter: Payer: Self-pay | Admitting: Critical Care Medicine

## 2012-01-23 VITALS — BP 114/70 | HR 101 | Temp 98.8°F | Wt 353.0 lb

## 2012-01-23 DIAGNOSIS — J449 Chronic obstructive pulmonary disease, unspecified: Secondary | ICD-10-CM

## 2012-01-23 NOTE — Assessment & Plan Note (Signed)
Gold stage D. COPD stable at this time Plan Maintain inhaled medications as prescribed  

## 2012-01-23 NOTE — Telephone Encounter (Signed)
LMOM to return call.

## 2012-01-23 NOTE — Progress Notes (Signed)
Subjective:    Patient ID: Tyrone Cunningham, male    DOB: [birth year], 56 y.o.   MRN: [id number]  HPI  56 y.o. WM   Copd. Trach dependent.   Pt last seen PW 9yrs ago. Dr Cleta Alberts has been managing the pt.  MOre recently had to go to hosp one month ago for lung infection.   Pt exposed to passive smoke exposure.  Not much mucus out of trach.  Trach in place since 2001 for OSA.    No real chest pain.  Has not seen cardiology.  Pt notes some edema in legs.   Notes some dyspnea at rest.  Pt has oxygen at night.  [facility] is DME.    01/23/2012 At last OV we recommended: Stop ipratroprium in nebulizer Change albuterol to 4 times daily in nebulizer Stay on Advair and Spiriva Continue to use oxygen as before  No real changes.  PT says is getting stronger.  No chest pain. Some edema in legs.  Notes mild amount of mucus.   Pt denies any significant sore throat, nasal congestion or excess secretions, fever, chills, sweats, unintended weight loss, pleurtic or exertional chest pain, orthopnea PND, or leg swelling Pt denies any increase in rescue therapy over baseline, denies waking up needing it or having any early am or nocturnal exacerbations of coughing/wheezing/or dyspnea. Pt also denies any obvious fluctuation in symptoms with  weather or environmental change or other alleviating or aggravating factors    Past Medical History  Diagnosis Date  . Bronchitis   . CHF (congestive heart failure)   . DM type 2 with diabetic peripheral neuropathy   . Hypertension   . Hypercholesteremia   . Hearing loss   . Peripheral neuropathy   . Sleep apnea     Severe sleep apnea requiring tracheostomy  . COPD (chronic obstructive pulmonary disease)      Family History  Problem Relation Age of Onset  . Coronary artery disease Father      History   Social History  . Marital Status: Single    Spouse Name: N/A    Number of Children: N/A  . Years of Education: N/A   Occupational History  . Not on file.   Social  History Main Topics  . Smoking status: Former Smoker -- 2.0 packs/day for 30 years    Types: Cigarettes    Quit date: 07/02/1999  . Smokeless tobacco: Never Used  . Alcohol Use: No  . Drug Use: No  . Sexually Active: No   Other Topics Concern  . Not on file   Social History Narrative  . No narrative on file     No Known Allergies   Outpatient Prescriptions Prior to Visit  Medication Sig Dispense Refill  . ADVAIR DISKUS 500-50 MCG/DOSE AEPB INHALE 1 PUFF BY MOUTH TWICE DAILY  14 each  5  . albuterol (PROVENTIL HFA;VENTOLIN HFA) 108 (90 BASE) MCG/ACT inhaler Inhale 2 puffs into the lungs every 4 (four) hours as needed. For shortness of breath.      Marland Kitchen albuterol (PROVENTIL) (2.5 MG/3ML) 0.083% nebulizer solution Take 3 mLs (2.5 mg total) by nebulization 4 (four) times daily.  75 mL    . atorvastatin (LIPITOR) 10 MG tablet TAKE 1 TABLET BY MOUTH DAILY  90 tablet  0  . furosemide (LASIX) 40 MG tablet Take 20 mg by mouth 2 (two) times daily.       Marland Kitchen gabapentin (NEURONTIN) 300 MG capsule Take 1 capsule (300 mg total) by mouth  2 (two) times daily. 2 capsules in the morning and 1 in the evening  90 capsule  1  . HYDROcodone-acetaminophen (NORCO) 7.5-325 MG per tablet Take 1 tablet by mouth every 4 (four) hours as needed. For pain.      Marland Kitchen insulin glargine (LANTUS) 100 UNIT/ML injection Inject 90 Units into the skin daily.  30 mL  1  . Insulin Syringe-Needle U-100 (B-D INS SYR ULTRAFINE 1CC/31G) 31G X 5/16" 1 ML MISC Use as Directed  100 each  1  . lisinopril (PRINIVIL,ZESTRIL) 10 MG tablet Take 10 mg by mouth daily.      . Multiple Vitamin (MULTIVITAMIN) tablet Take 1 tablet by mouth daily.      Marland Kitchen NOVOLIN R 100 UNIT/ML injection INJECT 6-12 UINTS IN THE SKIN BEFORE MEALS THREE TIMES DAILY PER SLIDING SCALE  10 mL  0  . permethrin (ELIMITE) 5 % cream APPLY TO THE AFFECTED AREA ONCE  60 g  0  . polyethylene glycol powder (GLYCOLAX/MIRALAX) powder ADD 1 CUP OF POWDER IN 8 OUNCES OF WATER EVERY DAY   255 g  3  . spironolactone (ALDACTONE) 25 MG tablet TAKE 1 TABLET BY MOUTH EVERY DAY  30 tablet  2  . tiotropium (SPIRIVA HANDIHALER) 18 MCG inhalation capsule Place 1 capsule (18 mcg total) into inhaler and inhale daily.  30 capsule  5     Review of Systems  Constitutional: Positive for fatigue. Negative for fever, chills, diaphoresis, activity change, appetite change and unexpected weight change.  HENT: Positive for congestion, rhinorrhea, sneezing and dental problem. Negative for hearing loss, ear pain, nosebleeds, sore throat, facial swelling, mouth sores, trouble swallowing, neck pain, neck stiffness, voice change, postnasal drip, sinus pressure, tinnitus and ear discharge.   Eyes: Negative for photophobia, discharge, itching and visual disturbance.  Respiratory: Positive for cough and shortness of breath. Negative for apnea, choking, chest tightness, wheezing and stridor.   Cardiovascular: Positive for chest pain and palpitations. Negative for leg swelling.  Gastrointestinal: Negative for nausea, vomiting, abdominal pain, constipation, blood in stool and abdominal distention.  Genitourinary: Negative for dysuria, urgency, frequency, hematuria, flank pain, decreased urine volume and difficulty urinating.  Musculoskeletal: Positive for back pain and gait problem. Negative for myalgias, joint swelling and arthralgias.  Skin: Negative for color change, pallor and rash.  Neurological: Negative for dizziness, tremors, seizures, syncope, speech difficulty, weakness, light-headedness, numbness and headaches.  Hematological: Negative for adenopathy. Does not bruise/bleed easily.  Psychiatric/Behavioral: Positive for sleep disturbance. Negative for confusion and agitation. The patient is not nervous/anxious.        Objective:   Physical Exam  Filed Vitals:   01/23/12 1330  BP: 114/70  Pulse: 101  Temp: 98.8 F (37.1 C)  Weight: 353 lb (160.12 kg)  SpO2: 90%    Gen: obese ,  well-nourished, in no distress,  normal affect,metal  trach in place  ENT: No lesions,  mouth clear,  oropharynx clear, no postnasal drip  Neck: No JVD, no TMG, no carotid bruits  Lungs: No use of accessory muscles, no dullness to percussion, distant BS  Cardiovascular: RRR, heart sounds normal, no murmur or gallops, 2+  peripheral edema  Abdomen: soft and NT, no HSM,  BS normal  Musculoskeletal: No deformities, no cyanosis or clubbing  Neuro: alert, non focal  Skin: Warm, no lesions or rashes  No results found.        Assessment & Plan:   [name] with frequent exacerbations Gold stage D. COPD stable  at this time Plan Maintain inhaled medications as prescribed    Updated Medication List Outpatient Encounter Prescriptions as of 01/23/2012  Medication Sig Dispense Refill  . ADVAIR DISKUS 500-50 MCG/DOSE AEPB INHALE 1 PUFF BY MOUTH TWICE DAILY  14 each  5  . albuterol (PROVENTIL HFA;VENTOLIN HFA) 108 (90 BASE) MCG/ACT inhaler Inhale 2 puffs into the lungs every 4 (four) hours as needed. For shortness of breath.      Marland Kitchen albuterol (PROVENTIL) (2.5 MG/3ML) 0.083% nebulizer solution Take 3 mLs (2.5 mg total) by nebulization 4 (four) times daily.  75 mL    . atorvastatin (LIPITOR) 10 MG tablet TAKE 1 TABLET BY MOUTH DAILY  90 tablet  0  . furosemide (LASIX) 40 MG tablet Take 20 mg by mouth 2 (two) times daily.       Marland Kitchen gabapentin (NEURONTIN) 300 MG capsule Take 1 capsule (300 mg total) by mouth 2 (two) times daily. 2 capsules in the morning and 1 in the evening  90 capsule  1  . HYDROcodone-acetaminophen (NORCO) 7.5-325 MG per tablet Take 1 tablet by mouth every 4 (four) hours as needed. For pain.      Marland Kitchen insulin glargine (LANTUS) 100 UNIT/ML injection Inject 90 Units into the skin daily.  30 mL  1  . Insulin Syringe-Needle U-100 (B-D INS SYR ULTRAFINE 1CC/31G) 31G X 5/16" 1 ML MISC Use as Directed  100 each  1  . lisinopril (PRINIVIL,ZESTRIL) 10 MG tablet Take 10 mg by mouth  daily.      . Multiple Vitamin (MULTIVITAMIN) tablet Take 1 tablet by mouth daily.      Marland Kitchen NOVOLIN R 100 UNIT/ML injection INJECT 6-12 UINTS IN THE SKIN BEFORE MEALS THREE TIMES DAILY PER SLIDING SCALE  10 mL  0  . permethrin (ELIMITE) 5 % cream APPLY TO THE AFFECTED AREA ONCE  60 g  0  . polyethylene glycol powder (GLYCOLAX/MIRALAX) powder ADD 1 CUP OF POWDER IN 8 OUNCES OF WATER EVERY DAY  255 g  3  . spironolactone (ALDACTONE) 25 MG tablet TAKE 1 TABLET BY MOUTH EVERY DAY  30 tablet  2  . tiotropium (SPIRIVA HANDIHALER) 18 MCG inhalation capsule Place 1 capsule (18 mcg total) into inhaler and inhale daily.  30 capsule  5

## 2012-01-23 NOTE — Patient Instructions (Signed)
No change in medications. Return in         4 months 

## 2012-01-25 DIAGNOSIS — G4733 Obstructive sleep apnea (adult) (pediatric): Secondary | ICD-10-CM | POA: Diagnosis not present

## 2012-01-25 DIAGNOSIS — E1142 Type 2 diabetes mellitus with diabetic polyneuropathy: Secondary | ICD-10-CM | POA: Diagnosis not present

## 2012-01-25 DIAGNOSIS — I10 Essential (primary) hypertension: Secondary | ICD-10-CM | POA: Diagnosis not present

## 2012-01-25 DIAGNOSIS — E1149 Type 2 diabetes mellitus with other diabetic neurological complication: Secondary | ICD-10-CM | POA: Diagnosis not present

## 2012-01-25 DIAGNOSIS — J441 Chronic obstructive pulmonary disease with (acute) exacerbation: Secondary | ICD-10-CM | POA: Diagnosis not present

## 2012-01-25 DIAGNOSIS — I509 Heart failure, unspecified: Secondary | ICD-10-CM | POA: Diagnosis not present

## 2012-02-16 ENCOUNTER — Telehealth: Payer: Self-pay

## 2012-02-16 NOTE — Telephone Encounter (Signed)
I do not think a refill of Elimite is appropriate. What other medications does he need?

## 2012-02-16 NOTE — Telephone Encounter (Signed)
Pt is requesting elimite for 3 months.  He has an appointment 03/09/2012 with dr. Cleta Alberts.    Please call [phone]  - He is going to run out of his current meds in about six days.

## 2012-02-17 NOTE — Telephone Encounter (Signed)
Called patient to advise. This is a one time only treatment.

## 2012-02-18 DIAGNOSIS — F341 Dysthymic disorder: Secondary | ICD-10-CM | POA: Diagnosis not present

## 2012-02-18 DIAGNOSIS — H81399 Other peripheral vertigo, unspecified ear: Secondary | ICD-10-CM | POA: Diagnosis not present

## 2012-02-18 DIAGNOSIS — F4542 Pain disorder with related psychological factors: Secondary | ICD-10-CM | POA: Diagnosis not present

## 2012-02-18 DIAGNOSIS — G541 Lumbosacral plexus disorders: Secondary | ICD-10-CM | POA: Diagnosis not present

## 2012-02-19 ENCOUNTER — Other Ambulatory Visit: Payer: Self-pay | Admitting: Radiology

## 2012-02-19 MED ORDER — INSULIN GLARGINE 100 UNIT/ML ~~LOC~~ SOLN: 90.0000 [IU] | Freq: Every day | SUBCUTANEOUS | Status: DC

## 2012-02-19 MED ORDER — FUROSEMIDE 40 MG PO TABS: 40.0000 mg | ORAL_TABLET | Freq: Two times a day (BID) | ORAL | Status: DC

## 2012-02-19 NOTE — Telephone Encounter (Signed)
Patient called about his lantus and furosemide. Discussed with Dr Cleta Alberts and sent this in.

## 2012-02-20 ENCOUNTER — Telehealth: Payer: Self-pay | Admitting: Family Medicine

## 2012-02-20 DIAGNOSIS — Z01818 Encounter for other preprocedural examination: Secondary | ICD-10-CM

## 2012-02-23 ENCOUNTER — Other Ambulatory Visit: Payer: Self-pay | Admitting: Physician Assistant

## 2012-02-27 ENCOUNTER — Other Ambulatory Visit: Payer: Self-pay | Admitting: Physician Assistant

## 2012-02-27 ENCOUNTER — Telehealth: Payer: Self-pay

## 2012-02-27 NOTE — Telephone Encounter (Signed)
Patient says [facility] has sent request for generic lipitor refill several times and we have not authorized it. Patient also needs novolin refilled.  [facility] - [place], [place] - [address] AT [facility] [address]

## 2012-02-28 NOTE — Telephone Encounter (Signed)
I cannot refill insulin, please review

## 2012-02-29 MED ORDER — ATORVASTATIN CALCIUM 10 MG PO TABS: 10.0000 mg | ORAL_TABLET | Freq: Every day | ORAL | Status: DC

## 2012-02-29 NOTE — Telephone Encounter (Signed)
Novolin refilled on 02/26/12. I have sent his lipitor. He will need OV for additional refills.

## 2012-03-03 ENCOUNTER — Telehealth: Payer: Self-pay | Admitting: Radiology

## 2012-03-03 NOTE — Telephone Encounter (Signed)
Error

## 2012-03-09 ENCOUNTER — Ambulatory Visit (INDEPENDENT_AMBULATORY_CARE_PROVIDER_SITE_OTHER): Payer: Medicare Other | Admitting: Emergency Medicine

## 2012-03-09 ENCOUNTER — Encounter: Payer: Self-pay | Admitting: Emergency Medicine

## 2012-03-09 ENCOUNTER — Telehealth: Payer: Self-pay

## 2012-03-09 VITALS — BP 133/76 | HR 109 | Temp 98.7°F | Resp 20 | Ht 64.5 in | Wt 361.0 lb

## 2012-03-09 DIAGNOSIS — R0902 Hypoxemia: Secondary | ICD-10-CM

## 2012-03-09 DIAGNOSIS — E669 Obesity, unspecified: Secondary | ICD-10-CM | POA: Diagnosis not present

## 2012-03-09 DIAGNOSIS — G4733 Obstructive sleep apnea (adult) (pediatric): Secondary | ICD-10-CM

## 2012-03-09 DIAGNOSIS — M542 Cervicalgia: Secondary | ICD-10-CM

## 2012-03-09 DIAGNOSIS — G629 Polyneuropathy, unspecified: Secondary | ICD-10-CM

## 2012-03-09 MED ORDER — GABAPENTIN 300 MG PO CAPS: ORAL_CAPSULE | ORAL | Status: DC

## 2012-03-09 NOTE — Telephone Encounter (Signed)
Returned call from voice mail regarding Gabapentin quantity.  Verified with Dr Cleta Alberts quantity should be #150.  Pharmacist aware.

## 2012-03-09 NOTE — Progress Notes (Signed)
  Subjective:    Patient ID: Tyrone Cunningham, male    DOB: 15-Jan-1956, 58 y.o.   MRN: [id number]  HPI patient enters for followup of his obstructive sleep apnea currently stable. Problem #2 is his diabetes mellitus which has been uncontrolled mainly secondary to his poor dietary habits. Problem #3 is his morbid obesity which is restricted because he is premature in his scooter and unable to exercise.    Review of Systems he also is having discomfort in his left forearm and left upper arm. He feels that he may have pulled a muscle in his neck but is not sure.     Objective:   Physical Exam Patient is alert and cooperative he has his O2 with him. Tracheostomy site looks clear of infection. His chest was clear to both auscultation and percussion his cardiac exam with a regular rate and rhythm. Pulse ox was 94%. Abdomen was morbidly obese. Extremity exam reveals severe stasis changes but no ulcerations are seen and there is no skin breakdown.  Results for orders placed in visit on 03/09/12  GLUCOSE, POCT (MANUAL RESULT ENTRY)      Component Value Range   POC Glucose 197 (*) 70 - 99 mg/dl  POCT GLYCOSYLATED HEMOGLOBIN (HGB A1C)      Component Value Range   Hemoglobin A1C 9.7     There is tenderness of the left biceps in the left forearm. He is unable to totally abduct the left arm.    Assessment & Plan:  Patient has to sit upright due to his obstructive sleep apnea and tracheostomy. He needs a electric hospital bed so he can be able to rest in an upright position. He was again advised he needs to be more conscientious about his diet and regular insulin usage. He is under a lot of stress since his roommate has moved back home. He is under pain management so I did not write for a pain medications. He did agree to increase his Neurontin dosage to see if we did give him some relief from his discomfort

## 2012-03-10 ENCOUNTER — Other Ambulatory Visit: Payer: Self-pay | Admitting: Emergency Medicine

## 2012-03-12 ENCOUNTER — Telehealth: Payer: Self-pay | Admitting: Family Medicine

## 2012-03-12 DIAGNOSIS — G4733 Obstructive sleep apnea (adult) (pediatric): Secondary | ICD-10-CM

## 2012-03-12 NOTE — Telephone Encounter (Signed)
Order for advanced home care for hospital bed

## 2012-03-16 ENCOUNTER — Ambulatory Visit (INDEPENDENT_AMBULATORY_CARE_PROVIDER_SITE_OTHER): Payer: Medicare Other | Admitting: Cardiovascular Disease

## 2012-03-16 ENCOUNTER — Encounter: Payer: Self-pay | Admitting: Cardiovascular Disease

## 2012-03-16 VITALS — BP 140/80 | HR 100 | Wt 361.0 lb

## 2012-03-16 DIAGNOSIS — R609 Edema, unspecified: Secondary | ICD-10-CM | POA: Insufficient documentation

## 2012-03-16 DIAGNOSIS — E7800 Pure hypercholesterolemia, unspecified: Secondary | ICD-10-CM

## 2012-03-16 DIAGNOSIS — IMO0002 Reserved for concepts with insufficient information to code with codable children: Secondary | ICD-10-CM

## 2012-03-16 DIAGNOSIS — I10 Essential (primary) hypertension: Secondary | ICD-10-CM | POA: Diagnosis not present

## 2012-03-16 DIAGNOSIS — Z01810 Encounter for preprocedural cardiovascular examination: Secondary | ICD-10-CM | POA: Diagnosis not present

## 2012-03-16 DIAGNOSIS — Z515 Encounter for palliative care: Secondary | ICD-10-CM | POA: Insufficient documentation

## 2012-03-16 DIAGNOSIS — E1165 Type 2 diabetes mellitus with hyperglycemia: Secondary | ICD-10-CM

## 2012-03-16 DIAGNOSIS — G4733 Obstructive sleep apnea (adult) (pediatric): Secondary | ICD-10-CM

## 2012-03-16 NOTE — Assessment & Plan Note (Signed)
Biggest risk of anesthesia or local for dental care is obesity and sleep apnea with tracheostomy.  Cardiac appears stable with normal EF by echo and no chest pain or history of CAD.  Would have Dr Delford Field see regarding pulmonary status for any procedure.  No cardiac contraindications

## 2012-03-16 NOTE — Progress Notes (Signed)
Patient ID: Tyrone Cunningham, male   DOB: 10/14/1955, 57 y.o.   MRN: [id number] 57 yo referred by Dr Cleta Alberts for preop clearance.  In talking to patient he has poor dentition and needs a lot of teeth pulled. He sees the pain clinic and apparently has taken too many narcotics in past.  He indicates he may not want his teeth pulled.  Last hospitalization 8/13.  COPD with morbid obesity and tracheostomy.  Rx for exacerbation and pneumonia.  Echo in August showed no valve disease and grossly normal EF.  Had diagnosis of CHF but BNP only 140 at time.  He is essentially wheel chair bound with hospital bed at home.  Always has edema in legs.  Does not have oxgyen at home.  No chest pain.  No palpitations.  No recent fevers or chills.  Has had flu shot  ROS: Denies fever, malais, weight loss, blurry vision, decreased visual acuity, cough, sputum, SOB, hemoptysis, pleuritic pain, palpitaitons, heartburn, abdominal pain, melena, lower extremity edema, claudication, or rash.  All other systems reviewed and negative   General: Affect appropriate Obese desheveled male HEENT:tracheostomy Neck supple with no adenopathy JVP normal no bruits no thyromegaly Lungs clear with no wheezing and good diaphragmatic motion Heart:  S1/S2 no murmur,rub, gallop or click PMI normal Abdomen: benighn, BS positve, no tenderness, no AAA no bruit.  No HSM or HJR Distal pulses intact with no bruits Plus two bilateral  edema Neuro non-focal Skin warm and dry No muscular weakness  Medications Current Outpatient Prescriptions  Medication Sig Dispense Refill  . ADVAIR DISKUS 500-50 MCG/DOSE AEPB INHALE 1 PUFF BY MOUTH TWICE DAILY  14 each  5  . albuterol (PROVENTIL HFA;VENTOLIN HFA) 108 (90 BASE) MCG/ACT inhaler Inhale 2 puffs into the lungs every 4 (four) hours as needed. For shortness of breath.      Marland Kitchen albuterol (PROVENTIL) (2.5 MG/3ML) 0.083% nebulizer solution Take 3 mLs (2.5 mg total) by nebulization 4 (four) times daily.  75 mL    .  atorvastatin (LIPITOR) 10 MG tablet Take 1 tablet (10 mg total) by mouth daily. Needs office visit  90 tablet  0  . furosemide (LASIX) 40 MG tablet TAKE 1 TABLET BY MOUTH TWICE DAILY .  60 tablet  0  . gabapentin (NEURONTIN) 300 MG capsule Take 2 capsules in the morning 1 capsule at lunch and 2 capsules in the evening  15 capsule  11  . HYDROcodone-acetaminophen (NORCO) 7.5-325 MG per tablet Take 1 tablet by mouth every 4 (four) hours as needed. For pain.      Marland Kitchen insulin glargine (LANTUS) 100 UNIT/ML injection Inject 90 Units into the skin daily.  30 mL  1  . Insulin Syringe-Needle U-100 (B-D INS SYR ULTRAFINE 1CC/31G) 31G X 5/16" 1 ML MISC Use as Directed  100 each  1  . lisinopril (PRINIVIL,ZESTRIL) 10 MG tablet Take 10 mg by mouth daily.      . Multiple Vitamin (MULTIVITAMIN) tablet Take 1 tablet by mouth daily.      Marland Kitchen NOVOLIN R 100 UNIT/ML injection INJECT 6 TO 12 UNITS FOUR TIMES DAILY AS DIRECTED PER SLIDING SCALE  30 mL  0  . permethrin (ELIMITE) 5 % cream APPLY TO THE AFFECTED AREA ONCE  60 g  0  . polyethylene glycol powder (GLYCOLAX/MIRALAX) powder DRINK 1 CAPFUL OF POWDER MIXED IN 8 OUNCES OF WATER EVERY DAY  255 g  3  . spironolactone (ALDACTONE) 25 MG tablet TAKE 1 TABLET BY MOUTH EVERY DAY  30 tablet  2  . tiotropium (SPIRIVA HANDIHALER) 18 MCG inhalation capsule Place 1 capsule (18 mcg total) into inhaler and inhale daily.  30 capsule  5    Allergies Review of patient's allergies indicates no known allergies.  Family History: Family History  Problem Relation Age of Onset  . Coronary artery disease Father     Social History: History   Social History  . Marital Status: Single    Spouse Name: N/A    Number of Children: N/A  . Years of Education: N/A   Occupational History  . Not on file.   Social History Main Topics  . Smoking status: Former Smoker -- 2.0 packs/day for 30 years    Types: Cigarettes    Quit date: 07/02/1999  . Smokeless tobacco: Never Used  .  Alcohol Use: No  . Drug Use: No  . Sexually Active: No   Other Topics Concern  . Not on file   Social History Narrative  . No narrative on file    Electrocardiogram:  10/17/11 SR rate 114 RBBB LAD  Assessment and Plan

## 2012-03-16 NOTE — Assessment & Plan Note (Signed)
Due to poor diet and morbid obesity.  Target A1c 7 or less

## 2012-03-16 NOTE — Assessment & Plan Note (Signed)
Well controlled.  Continue current medications and low sodium Dash type diet.    

## 2012-03-16 NOTE — Patient Instructions (Addendum)
Your physician recommends that you schedule a follow-up appointment in: AS NEEDED  Your physician recommends that you continue on your current medications as directed. Please refer to the Current Medication list given to you today.  

## 2012-03-16 NOTE — Assessment & Plan Note (Signed)
F/U Dr Delford Field.  Had flu shot no active wheezing

## 2012-03-16 NOTE — Assessment & Plan Note (Signed)
Dependant from obesity  Continue current dose of diuretic

## 2012-03-16 NOTE — Assessment & Plan Note (Signed)
Cholesterol is at goal.  Continue current dose of statin and diet Rx.  No myalgias or side effects.  F/U  LFT's in 6 months. No results found for this basename: [facility]   Labs with Dr. Cleta Alberts

## 2012-03-19 DIAGNOSIS — H81399 Other peripheral vertigo, unspecified ear: Secondary | ICD-10-CM | POA: Diagnosis not present

## 2012-03-19 DIAGNOSIS — F4542 Pain disorder with related psychological factors: Secondary | ICD-10-CM | POA: Diagnosis not present

## 2012-03-19 DIAGNOSIS — F341 Dysthymic disorder: Secondary | ICD-10-CM | POA: Diagnosis not present

## 2012-03-19 DIAGNOSIS — Z79899 Other long term (current) drug therapy: Secondary | ICD-10-CM | POA: Diagnosis not present

## 2012-03-19 DIAGNOSIS — M542 Cervicalgia: Secondary | ICD-10-CM | POA: Diagnosis not present

## 2012-03-19 DIAGNOSIS — G541 Lumbosacral plexus disorders: Secondary | ICD-10-CM | POA: Diagnosis not present

## 2012-03-22 ENCOUNTER — Telehealth: Payer: Self-pay

## 2012-03-22 ENCOUNTER — Other Ambulatory Visit: Payer: Self-pay | Admitting: Emergency Medicine

## 2012-03-22 DIAGNOSIS — R52 Pain, unspecified: Secondary | ICD-10-CM

## 2012-03-22 NOTE — Telephone Encounter (Signed)
Tyrone Cunningham cannot be put to sleep for any dental work. If he is going to require more than local anesthesia he needs clearance from Dr. Lazarus Salines or his pulmonary doctor.

## 2012-03-22 NOTE — Telephone Encounter (Signed)
Dr Cleta Alberts, do you want to clear pt for dental work?

## 2012-03-22 NOTE — Telephone Encounter (Signed)
Needs medical clearance for dental work   Dr. Ocie Doyne  - [phone]  Nothing has been scheduled - waiting for okay

## 2012-03-25 ENCOUNTER — Encounter (HOSPITAL_COMMUNITY): Payer: Self-pay | Admitting: *Deleted

## 2012-03-25 ENCOUNTER — Emergency Department (HOSPITAL_COMMUNITY)
Admission: EM | Admit: 2012-03-25 | Discharge: 2012-03-25 | Disposition: A | Discharge status: Home / Self Care | Payer: Medicare Other | Attending: Emergency Medicine | Admitting: Emergency Medicine

## 2012-03-25 ENCOUNTER — Telehealth: Payer: Self-pay

## 2012-03-25 DIAGNOSIS — Z79899 Other long term (current) drug therapy: Secondary | ICD-10-CM | POA: Insufficient documentation

## 2012-03-25 DIAGNOSIS — J4489 Other specified chronic obstructive pulmonary disease: Secondary | ICD-10-CM | POA: Insufficient documentation

## 2012-03-25 DIAGNOSIS — J449 Chronic obstructive pulmonary disease, unspecified: Secondary | ICD-10-CM | POA: Insufficient documentation

## 2012-03-25 DIAGNOSIS — Z7982 Long term (current) use of aspirin: Secondary | ICD-10-CM | POA: Diagnosis not present

## 2012-03-25 DIAGNOSIS — Z8669 Personal history of other diseases of the nervous system and sense organs: Secondary | ICD-10-CM | POA: Insufficient documentation

## 2012-03-25 DIAGNOSIS — I509 Heart failure, unspecified: Secondary | ICD-10-CM | POA: Insufficient documentation

## 2012-03-25 DIAGNOSIS — E7800 Pure hypercholesterolemia, unspecified: Secondary | ICD-10-CM | POA: Insufficient documentation

## 2012-03-25 DIAGNOSIS — I10 Essential (primary) hypertension: Secondary | ICD-10-CM | POA: Diagnosis not present

## 2012-03-25 DIAGNOSIS — Z8709 Personal history of other diseases of the respiratory system: Secondary | ICD-10-CM | POA: Insufficient documentation

## 2012-03-25 DIAGNOSIS — Z87891 Personal history of nicotine dependence: Secondary | ICD-10-CM | POA: Diagnosis not present

## 2012-03-25 DIAGNOSIS — E1142 Type 2 diabetes mellitus with diabetic polyneuropathy: Secondary | ICD-10-CM | POA: Diagnosis not present

## 2012-03-25 DIAGNOSIS — G909 Disorder of the autonomic nervous system, unspecified: Secondary | ICD-10-CM | POA: Insufficient documentation

## 2012-03-25 DIAGNOSIS — E1149 Type 2 diabetes mellitus with other diabetic neurological complication: Secondary | ICD-10-CM | POA: Insufficient documentation

## 2012-03-25 DIAGNOSIS — Z794 Long term (current) use of insulin: Secondary | ICD-10-CM | POA: Insufficient documentation

## 2012-03-25 DIAGNOSIS — E1140 Type 2 diabetes mellitus with diabetic neuropathy, unspecified: Secondary | ICD-10-CM

## 2012-03-25 MED ORDER — ALBUTEROL SULFATE (5 MG/ML) 0.5% IN NEBU
INHALATION_SOLUTION | RESPIRATORY_TRACT | Status: AC
  Administered 2012-03-25: 5 mg
  Filled 2012-03-25: qty 1

## 2012-03-25 MED ORDER — TRAMADOL HCL 50 MG PO TABS: 50.0000 mg | ORAL_TABLET | Freq: Four times a day (QID) | ORAL | Status: DC | PRN

## 2012-03-25 MED ORDER — TRAMADOL HCL 50 MG PO TABS
100.0000 mg | ORAL_TABLET | Freq: Once | ORAL | Status: AC
  Administered 2012-03-25: 100 mg via ORAL
  Filled 2012-03-25: qty 2

## 2012-03-25 NOTE — ED Notes (Signed)
Pt c/o bilateral hand pain from diabetic neuropathy. Pt states that he doesn't have a doctor to manage pain anymore because he didn't like him. Pt denies any other complaints

## 2012-03-25 NOTE — Telephone Encounter (Signed)
HELEN FROM [facility] STATED THEY HAVE BEEN FAXING OVER A FORM TO BE SIGNED BY THE DR AND RETURN TO THEM AND THEY STILL HASN'T HEARD FROM ANYONE. PLEASE CALL [phone] [phone]

## 2012-03-25 NOTE — Telephone Encounter (Signed)
I found the order in the scan stack and it had been faxed back to [facility] on 03/03/12. I refaxed order and received a confirmation. I tried to call Myriam Jacobson back to notify it was sent but there was no ans/no VM on her ext x 3.

## 2012-03-25 NOTE — ED Provider Notes (Signed)
Medical screening examination/treatment/procedure(s) were performed by non-physician practitioner and as supervising physician I was immediately available for consultation/collaboration.   Evagelia Knack H Boaz Berisha, MD 03/25/12 2344 

## 2012-03-25 NOTE — ED Notes (Signed)
PT with hx of neuropathy to hands and feet r/t diabetes is awaiting to see MD he was referred to for this pain.  However, it is taking too long and he is tired of suffering.

## 2012-03-25 NOTE — ED Provider Notes (Signed)
History   This chart was scribed for non-physician practitioner working with Richardean Canal, MD by Gerlean Ren, ED Scribe. This patient was seen in room TR11C/TR11C and the patient's care was started at 5:11 PM.    CSN: [id number]  Arrival date & time 03/25/12  1300   First MD Initiated Contact with Patient 03/25/12 1626      Chief Complaint  Patient presents with  . Extremity Pain     The history is provided by the patient. No language interpreter was used.   Tyrone Cunningham is a 57 y.o. male with h/o CHF, DM-II, HTN, peripheral neuropathy, and COPD who presents to the Emergency Department complaining of pain in bilateral hands and feet consistent with his h/o diabetic peripheral neuropathy. Patient states that the pain is constant.  He has tried everything, but states that he has not tried Ultram, which he requests at this visit.  His pain is moderate. Pt denies any further physical symptoms.  Pt is a former smoker and denies alcohol use.  Past Medical History  Diagnosis Date  . Bronchitis   . CHF (congestive heart failure)   . DM type 2 with diabetic peripheral neuropathy   . Hypertension   . Hypercholesteremia   . Hearing loss   . Peripheral neuropathy   . Sleep apnea     Severe sleep apnea requiring tracheostomy  . COPD (chronic obstructive pulmonary disease)     Past Surgical History  Procedure Date  . Tracheostomy   . Appendectomy   . Hand surgery   . Left femor surgery     Family History  Problem Relation Age of Onset  . Coronary artery disease Father     History  Substance Use Topics  . Smoking status: Former Smoker -- 2.0 packs/day for 30 years    Types: Cigarettes    Quit date: 07/02/1999  . Smokeless tobacco: Never Used  . Alcohol Use: No      Review of Systems  Musculoskeletal:       Hand pain, feet pain    Allergies  Review of patient's allergies indicates no known allergies.  Home Medications   Current Outpatient Rx  Name  Route  Sig  Dispense   Refill  . ADVAIR DISKUS 500-50 MCG/DOSE IN AEPB      INHALE 1 PUFF BY MOUTH TWICE DAILY   14 each   5   . ALBUTEROL SULFATE HFA 108 (90 BASE) MCG/ACT IN AERS   Inhalation   Inhale 2 puffs into the lungs every 4 (four) hours as needed. For shortness of breath.         . ALBUTEROL SULFATE (2.5 MG/3ML) 0.083% IN NEBU   Nebulization   Take 3 mLs (2.5 mg total) by nebulization 4 (four) times daily.   75 mL      . ASPIRIN 325 MG PO TABS   Oral   Take 325 mg by mouth daily.         . ATORVASTATIN CALCIUM 10 MG PO TABS   Oral   Take 1 tablet (10 mg total) by mouth daily. Needs office visit   90 tablet   0   . DIPHENHYDRAMINE-APAP (SLEEP) 25-500 MG PO TABS   Oral   Take 2 tablets by mouth at bedtime as needed. For pain         . FUROSEMIDE 40 MG PO TABS      TAKE 1 TABLET BY MOUTH TWICE DAILY .   60 tablet  0   . GABAPENTIN 300 MG PO CAPS      Take 2 capsules in the morning 1 capsule at lunch and 2 capsules in the evening   15 capsule   11   . HYDROCODONE-ACETAMINOPHEN 7.5-325 MG PO TABS   Oral   Take 1 tablet by mouth every 4 (four) hours as needed. For pain.         . INSULIN GLARGINE 100 UNIT/ML Wisdom SOLN   Subcutaneous   Inject 90 Units into the skin daily.   30 mL   1   . INSULIN SYRINGE-NEEDLE U-100 31G X 5/16" 1 ML MISC      Use as Directed   100 each   1   . LISINOPRIL 10 MG PO TABS   Oral   Take 10 mg by mouth daily.         Marland Kitchen ONE-DAILY MULTI VITAMINS PO TABS   Oral   Take 1 tablet by mouth daily.         Marland Kitchen NOVOLIN R 100 UNIT/ML IJ SOLN      INJECT 6 TO 12 UNITS FOUR TIMES DAILY AS DIRECTED PER SLIDING SCALE   30 mL   0   . PERMETHRIN 5 % EX CREA      APPLY TO THE AFFECTED AREA ONCE   60 g   0   . POLYETHYLENE GLYCOL 3350 PO POWD      DRINK 1 CAPFUL OF POWDER MIXED IN 8 OUNCES OF WATER EVERY DAY   255 g   3   . SPIRONOLACTONE 25 MG PO TABS      TAKE 1 TABLET BY MOUTH EVERY DAY   30 tablet   2   . TIOTROPIUM BROMIDE  MONOHYDRATE 18 MCG IN CAPS   Inhalation   Place 1 capsule (18 mcg total) into inhaler and inhale daily.   30 capsule   5     BP 181/98  Pulse 73  Temp 97.8 F (36.6 C) (Oral)  SpO2 99%  Physical Exam  Nursing note and vitals reviewed. Constitutional: He is oriented to person, place, and time. He appears well-developed and well-nourished. No distress.  HENT:  Head: Normocephalic and atraumatic.       stoma  Eyes: Conjunctivae normal and EOM are normal.  Neck: Normal range of motion. Neck supple.  Cardiovascular: Normal rate, regular rhythm and normal heart sounds.   No murmur heard. Pulmonary/Chest: Effort normal and breath sounds normal. No respiratory distress. He has no wheezes.  Abdominal: He exhibits no distension.  Musculoskeletal: Normal range of motion.  Neurological: He is alert and oriented to person, place, and time.  Skin: Skin is warm and dry.  Psychiatric: He has a normal mood and affect. His behavior is normal. Judgment and thought content normal.    ED Course  Procedures (including critical care time) DIAGNOSTIC STUDIES: Oxygen Saturation is 99% on room air, normal by my interpretation.    COORDINATION OF CARE: 5:16 PM- Patient informed of clinical course, understands medical decision-making process, and agrees with plan.   1. Diabetic neuropathy, painful       MDM  57 year old male requesting pain medicine for diabetic neuropathy. He specifically requests to try Ultram, will give him a prescription for Ultram and have him followup with his primary care provider. No other complaints at this time. Patient is stable and ready for discharge.   I personally performed the services described in this documentation, which was scribed in my presence. The  recorded information has been reviewed and is accurate.          Roxy Horseman, PA-C 03/25/12 1722

## 2012-04-02 ENCOUNTER — Ambulatory Visit (INDEPENDENT_AMBULATORY_CARE_PROVIDER_SITE_OTHER): Payer: Medicare Other | Admitting: Emergency Medicine

## 2012-04-02 VITALS — BP 146/78 | HR 78 | Temp 98.8°F | Resp 22 | Ht 66.0 in | Wt 365.0 lb

## 2012-04-02 DIAGNOSIS — G589 Mononeuropathy, unspecified: Secondary | ICD-10-CM | POA: Diagnosis not present

## 2012-04-02 DIAGNOSIS — G8929 Other chronic pain: Secondary | ICD-10-CM | POA: Diagnosis not present

## 2012-04-02 DIAGNOSIS — G629 Polyneuropathy, unspecified: Secondary | ICD-10-CM

## 2012-04-02 MED ORDER — TRAMADOL HCL 50 MG PO TABS: ORAL_TABLET | ORAL | Status: DC

## 2012-04-02 NOTE — Progress Notes (Signed)
  Subjective:    Patient ID: Tyrone Cunningham, male    DOB: 02/21/1956, 57 y.o.   MRN: [id number]  HPI Pt has been having body aches. He was given ultram for chronic pain. He says his hands are bothering him the most. The patient had been dismissed from pain management. We have been working on his referral to a different pain management Center. About one week ago he went to the emergency room and was given 4 days' worth of medication and told to followup with me .   Review of Systems     Objective:   Physical Exam patient is alert and cooperative. He has degenerative changes of both hands. He is a tracheostomy scar. His chest exam reveals decreased breath sounds in the bases. Abdomen soft nontender.        Assessment & Plan:  Patient here with chronic pain syndrome related to back disease peripheral neuropathy and previous left femur fracture. I did agree to write him for a total of 4 g per 24 hours in one week supplies with one refill in one week . I told him I would be the only person allowed to write this prescription. I told him I was actively working on referral to pain management .

## 2012-04-04 ENCOUNTER — Other Ambulatory Visit: Payer: Self-pay | Admitting: *Deleted

## 2012-04-04 MED ORDER — LISINOPRIL 10 MG PO TABS: 10.0000 mg | ORAL_TABLET | Freq: Every day | ORAL | Status: DC

## 2012-04-04 MED ORDER — SPIRONOLACTONE 25 MG PO TABS: 25.0000 mg | ORAL_TABLET | Freq: Every day | ORAL | Status: DC

## 2012-04-06 NOTE — Telephone Encounter (Signed)
Dr. Randa Evens office called again about clearance for surgery. Gave them Dr. Ellis Parents message about needing clearance from Dr. Lazarus Salines or his pulmonary Dr. Jovita Gamma them Dr. Raye Sorrow phone number.

## 2012-04-08 ENCOUNTER — Other Ambulatory Visit: Payer: Self-pay | Admitting: Physician Assistant

## 2012-04-09 ENCOUNTER — Telehealth: Payer: Self-pay | Admitting: Critical Care Medicine

## 2012-04-09 NOTE — Telephone Encounter (Signed)
I called Dr. Randa Evens office.  They are closed on Fridays.  Will call back on Monday.

## 2012-04-09 NOTE — Telephone Encounter (Signed)
Call Dr Barbette Merino office as well as send this note :  Let them know I have cleared this pt medically for planned teeth extraction procedures.  See form in your file Crystal.   Dorcas Carrow Beeper  [phone]  Cell  [phone]  If no response or cell goes to voicemail, call beeper [phone]

## 2012-04-12 NOTE — Telephone Encounter (Signed)
Called, spoke with Lennox Laity with Dr. Randa Evens office.  She is aware of below per Dr. Delford Field.  I have faxed this note along with last OV note to [phone] along with faxed surgery clearance request from their office.  Jodi aware and voiced no further questions or concerns at this time.

## 2012-04-13 NOTE — Telephone Encounter (Signed)
error 

## 2012-04-16 ENCOUNTER — Other Ambulatory Visit: Payer: Self-pay | Admitting: Emergency Medicine

## 2012-04-16 NOTE — Telephone Encounter (Signed)
Dr. Cleta Alberts I am forwarding this request to you based on your documentation from the office visit.

## 2012-04-16 NOTE — Telephone Encounter (Signed)
I'm going to okay his prescription request. He needs to get in to see me at 104 when he can

## 2012-04-16 NOTE — Telephone Encounter (Signed)
Spoke to pt who reports that he was going to come into walk-in today to see Dr Cleta Alberts but he can't get out of his driveway. He has the referral appt scheduled for 04/22/12, but is requesting RF of his Tramadol until then. Dr Cleta Alberts, please see Rx request in your in basket for review.

## 2012-04-16 NOTE — Telephone Encounter (Signed)
Called patient to advise Dr Cleta Alberts sent in meds. He needs to follow up with Dr Cleta Alberts.

## 2012-04-16 NOTE — Telephone Encounter (Signed)
Pt called to cancel his 2:00 with Dr. Cleta Alberts  Pt called to ask for his medications to be refilled.  Pt called again to let Dr. Cleta Alberts know he has heard from the referral place.   Pt would like to talk with Dr. Cleta Alberts  -  [phone]

## 2012-04-18 ENCOUNTER — Other Ambulatory Visit: Payer: Self-pay | Admitting: Physician Assistant

## 2012-04-22 ENCOUNTER — Other Ambulatory Visit: Payer: Self-pay | Admitting: Pain Medicine

## 2012-04-22 ENCOUNTER — Other Ambulatory Visit (HOSPITAL_COMMUNITY): Payer: Self-pay | Admitting: Oral Surgery

## 2012-04-22 DIAGNOSIS — M79609 Pain in unspecified limb: Secondary | ICD-10-CM | POA: Diagnosis not present

## 2012-04-22 DIAGNOSIS — G894 Chronic pain syndrome: Secondary | ICD-10-CM | POA: Diagnosis not present

## 2012-04-22 DIAGNOSIS — Z79899 Other long term (current) drug therapy: Secondary | ICD-10-CM | POA: Diagnosis not present

## 2012-04-22 DIAGNOSIS — E1142 Type 2 diabetes mellitus with diabetic polyneuropathy: Secondary | ICD-10-CM | POA: Diagnosis not present

## 2012-04-22 DIAGNOSIS — M542 Cervicalgia: Secondary | ICD-10-CM

## 2012-04-27 ENCOUNTER — Encounter (HOSPITAL_COMMUNITY): Payer: Self-pay | Admitting: Pharmacy Technician

## 2012-05-01 ENCOUNTER — Other Ambulatory Visit: Payer: Self-pay | Admitting: Emergency Medicine

## 2012-05-04 ENCOUNTER — Other Ambulatory Visit: Payer: Medicare Other

## 2012-05-06 NOTE — H&P (Signed)
HISTORY AND PHYSICAL  Tyrone Cunningham is a 57 y.o. male patient with CC: painful teeth.  No diagnosis found.  Past Medical History  Diagnosis Date  . Bronchitis   . CHF (congestive heart failure)   . DM type 2 with diabetic peripheral neuropathy   . Hypertension   . Hypercholesteremia   . Hearing loss   . Peripheral neuropathy   . Sleep apnea     Severe sleep apnea requiring tracheostomy  . COPD (chronic obstructive pulmonary disease)     No current facility-administered medications for this encounter.   Current Outpatient Prescriptions  Medication Sig Dispense Refill  . albuterol (PROVENTIL HFA;VENTOLIN HFA) 108 (90 BASE) MCG/ACT inhaler Inhale 2 puffs into the lungs every 4 (four) hours as needed. For shortness of breath.      Marland Kitchen albuterol (PROVENTIL) (2.5 MG/3ML) 0.083% nebulizer solution Take 3 mLs (2.5 mg total) by nebulization 4 (four) times daily.  75 mL    . atorvastatin (LIPITOR) 10 MG tablet Take 1 tablet (10 mg total) by mouth daily. Needs office visit  90 tablet  0  . Fluticasone-Salmeterol (ADVAIR) 500-50 MCG/DOSE AEPB Inhale 1 puff into the lungs every 12 (twelve) hours.      . furosemide (LASIX) 40 MG tablet Take 40 mg by mouth 2 (two) times daily.      Marland Kitchen gabapentin (NEURONTIN) 300 MG capsule Take 300-600 mg by mouth 3 (three) times daily. Take 2 capsules in the morning 1 capsule at lunch and 2 capsules in the evening      . insulin regular (NOVOLIN R,HUMULIN R) 100 units/mL injection Inject 6-12 Units into the skin 4 (four) times daily.      . Insulin Syringe-Needle U-100 (B-D INS SYR ULTRAFINE 1CC/31G) 31G X 5/16" 1 ML MISC Use as Directed  100 each  1  . lisinopril (PRINIVIL,ZESTRIL) 10 MG tablet Take 1 tablet (10 mg total) by mouth daily.  90 tablet  0  . Multiple Vitamin (MULTIVITAMIN) tablet Take 1 tablet by mouth daily.      . polyethylene glycol (MIRALAX / GLYCOLAX) packet Take 17 g by mouth daily.      . pregabalin (LYRICA) 75 MG capsule Take 75 mg by mouth at  bedtime.      Marland Kitchen spironolactone (ALDACTONE) 25 MG tablet Take 1 tablet (25 mg total) by mouth daily.  90 tablet  0  . tiotropium (SPIRIVA HANDIHALER) 18 MCG inhalation capsule Place 1 capsule (18 mcg total) into inhaler and inhale daily.  30 capsule  5  . traMADol (ULTRAM) 50 MG tablet Take 100 mg by mouth every 6 (six) hours as needed for pain.      Marland Kitchen LANTUS 100 UNIT/ML injection INJECT 90 UNITS INTO THE SKIN DAILY  30 mL  0   No Known Allergies Active Problems:   * No active hospital problems. *  Vitals: There were no vitals taken for this visit. Lab results:No results found for this or any previous visit (from the past 24 hour(s)). Radiology Results: No results found. General appearance: alert, cooperative, no distress and morbidly obese Head: Normocephalic, without obvious abnormality, atraumatic Eyes: negative Ears: normal TM's and external ear canals both ears Nose: Nares normal. Septum midline. Mucosa normal. No drainage or sinus tenderness. Throat: Dental caries teeth #'s 5, 6, 8, 9, 11, 12, 18, 19, 29, 30, 31 Neck: no adenopathy, supple, symmetrical, trachea midline, thyroid not enlarged, symmetric, no tenderness/mass/nodules and Patent tracheostomy  Resp: diminished breath sounds bilaterally Cardio: regular rate and  rhythm, S1, S2 normal, no murmur, click, rub or gallop  Assessment:56 WM COPD, OSA, Tracheostomy, Morbid Obesity, CHF, DM2, Chronic Pain with non-restorable teeth #'s 5, 6, 8, 9, 11, 12, 18, 19, 29, 30, 31.  Plan: Extract teeth #'s 5, 6, 8, 9, 11, 12, 18, 19, 29, 30, 31. Alveoloplasty. Day surgery.   [name] 05/06/2012

## 2012-05-07 ENCOUNTER — Encounter (HOSPITAL_COMMUNITY): Payer: Self-pay

## 2012-05-07 ENCOUNTER — Inpatient Hospital Stay (HOSPITAL_COMMUNITY)
Admission: RE | Admit: 2012-05-07 | Discharge: 2012-05-07 | Disposition: A | Discharge status: Home / Self Care | Payer: Medicare Other | Source: Ambulatory Visit

## 2012-05-07 NOTE — Pre-Procedure Instructions (Signed)
Ozzie Knobel  05/07/2012   Your procedure is scheduled on:  Tuesday, March 11  Report to [facility] at 0530 AM.  Call this number if you have problems the morning of surgery: [phone]   Remember:   Do not eat food or drink liquids after midnight.Monday night   Take these medicines the morning of surgery with A SIP OF WATER: Nebulizer treatment, inhaler as needed,Advair,Gabapentin,Spiriva, Tramadol,if needed   Do not wear jewelry, make-up or nail polish.  Do not wear lotions, powders, or perfumes.NO deodorant.   . Men may shave face and neck.  Do not bring valuables to the hospital.  Contacts, dentures or bridgework may not be worn into surgery.     Patients discharged the day of surgery will not be allowed to drive  home.  Name and phone number of your driver:   Special Instructions: Shower using CHG 2 nights before surgery and the night before surgery.  If you shower the day of surgery use CHG.  Use special wash - you have one bottle of CHG for all showers.  You should use approximately 1/3 of the bottle for each shower.   Please read over the following fact sheets that you were given: Pain Booklet, Coughing and Deep Breathing and Surgical Site Infection Prevention

## 2012-05-11 ENCOUNTER — Ambulatory Visit (HOSPITAL_COMMUNITY)
Admission: RE | Admit: 2012-05-11 | Discharge: 2012-05-11 | Disposition: A | Discharge status: Home / Self Care | Payer: Medicare Other | Source: Ambulatory Visit | Attending: Oral Surgery | Admitting: Oral Surgery

## 2012-05-11 ENCOUNTER — Ambulatory Visit (HOSPITAL_COMMUNITY): Payer: Medicare Other

## 2012-05-11 ENCOUNTER — Ambulatory Visit (HOSPITAL_COMMUNITY): Payer: Medicare Other | Admitting: Anesthesiology

## 2012-05-11 ENCOUNTER — Encounter (HOSPITAL_COMMUNITY): Payer: Self-pay | Admitting: Anesthesiology

## 2012-05-11 ENCOUNTER — Encounter (HOSPITAL_COMMUNITY)
Admission: RE | Disposition: A | Discharge status: Home / Self Care | Payer: Self-pay | Source: Ambulatory Visit | Attending: Oral Surgery

## 2012-05-11 DIAGNOSIS — G4730 Sleep apnea, unspecified: Secondary | ICD-10-CM | POA: Diagnosis not present

## 2012-05-11 DIAGNOSIS — E7800 Pure hypercholesterolemia, unspecified: Secondary | ICD-10-CM | POA: Diagnosis not present

## 2012-05-11 DIAGNOSIS — G4733 Obstructive sleep apnea (adult) (pediatric): Secondary | ICD-10-CM

## 2012-05-11 DIAGNOSIS — Z930 Tracheostomy status: Secondary | ICD-10-CM

## 2012-05-11 DIAGNOSIS — K029 Dental caries, unspecified: Secondary | ICD-10-CM | POA: Diagnosis not present

## 2012-05-11 DIAGNOSIS — E1165 Type 2 diabetes mellitus with hyperglycemia: Secondary | ICD-10-CM

## 2012-05-11 DIAGNOSIS — R609 Edema, unspecified: Secondary | ICD-10-CM

## 2012-05-11 DIAGNOSIS — Z01818 Encounter for other preprocedural examination: Secondary | ICD-10-CM | POA: Diagnosis not present

## 2012-05-11 DIAGNOSIS — IMO0002 Reserved for concepts with insufficient information to code with codable children: Secondary | ICD-10-CM

## 2012-05-11 DIAGNOSIS — J449 Chronic obstructive pulmonary disease, unspecified: Secondary | ICD-10-CM | POA: Diagnosis not present

## 2012-05-11 DIAGNOSIS — J4489 Other specified chronic obstructive pulmonary disease: Secondary | ICD-10-CM

## 2012-05-11 DIAGNOSIS — E1142 Type 2 diabetes mellitus with diabetic polyneuropathy: Secondary | ICD-10-CM | POA: Diagnosis not present

## 2012-05-11 DIAGNOSIS — Z794 Long term (current) use of insulin: Secondary | ICD-10-CM | POA: Diagnosis not present

## 2012-05-11 DIAGNOSIS — E119 Type 2 diabetes mellitus without complications: Secondary | ICD-10-CM

## 2012-05-11 DIAGNOSIS — E669 Obesity, unspecified: Secondary | ICD-10-CM

## 2012-05-11 DIAGNOSIS — K056 Periodontal disease, unspecified: Secondary | ICD-10-CM

## 2012-05-11 DIAGNOSIS — I509 Heart failure, unspecified: Secondary | ICD-10-CM

## 2012-05-11 DIAGNOSIS — E1149 Type 2 diabetes mellitus with other diabetic neurological complication: Secondary | ICD-10-CM | POA: Diagnosis not present

## 2012-05-11 DIAGNOSIS — I10 Essential (primary) hypertension: Secondary | ICD-10-CM

## 2012-05-11 DIAGNOSIS — K56609 Unspecified intestinal obstruction, unspecified as to partial versus complete obstruction: Secondary | ICD-10-CM

## 2012-05-11 DIAGNOSIS — J189 Pneumonia, unspecified organism: Secondary | ICD-10-CM

## 2012-05-11 DIAGNOSIS — Z01810 Encounter for preprocedural cardiovascular examination: Secondary | ICD-10-CM

## 2012-05-11 DIAGNOSIS — G629 Polyneuropathy, unspecified: Secondary | ICD-10-CM

## 2012-05-11 HISTORY — PX: MULTIPLE EXTRACTIONS WITH ALVEOLOPLASTY: SHX5342

## 2012-05-11 LAB — CBC
HCT: 45.3 % (ref 39.0–52.0)
MCHC: 34.2 g/dL (ref 30.0–36.0)
MCV: 84.4 fL (ref 78.0–100.0)
Platelets: 234 10*3/uL (ref 150–400)
RDW: 14.4 % (ref 11.5–15.5)

## 2012-05-11 LAB — BASIC METABOLIC PANEL
BUN: 25 mg/dL — ABNORMAL HIGH (ref 6–23)
Calcium: 10.4 mg/dL (ref 8.4–10.5)
Chloride: 91 mEq/L — ABNORMAL LOW (ref 96–112)
Creatinine, Ser: 0.8 mg/dL (ref 0.50–1.35)
GFR calc Af Amer: >90 mL/min (ref >90)

## 2012-05-11 LAB — GLUCOSE, CAPILLARY

## 2012-05-11 SURGERY — MULTIPLE EXTRACTION WITH ALVEOLOPLASTY
Anesthesia: General | Site: Mouth | Wound class: Clean Contaminated

## 2012-05-11 MED ORDER — AMOXICILLIN 500 MG PO CAPS: 500.0000 mg | ORAL_CAPSULE | Freq: Three times a day (TID) | ORAL | Status: DC

## 2012-05-11 MED ORDER — OXYCODONE-ACETAMINOPHEN 5-325 MG PO TABS
ORAL_TABLET | ORAL | Status: AC
  Filled 2012-05-11: qty 2

## 2012-05-11 MED ORDER — SODIUM CHLORIDE 0.9 % IR SOLN
Status: DC | PRN
  Administered 2012-05-11: 1000 mL

## 2012-05-11 MED ORDER — OXYCODONE-ACETAMINOPHEN 10-325 MG PO TABS: 1.0000 | ORAL_TABLET | ORAL | Status: DC | PRN

## 2012-05-11 MED ORDER — FENTANYL CITRATE 0.05 MG/ML IJ SOLN
INTRAMUSCULAR | Status: DC | PRN
  Administered 2012-05-11: 100 ug via INTRAVENOUS

## 2012-05-11 MED ORDER — HYDROMORPHONE HCL PF 1 MG/ML IJ SOLN: 0.2500 mg | INTRAMUSCULAR | Status: DC | PRN

## 2012-05-11 MED ORDER — ALBUTEROL SULFATE (5 MG/ML) 0.5% IN NEBU
INHALATION_SOLUTION | RESPIRATORY_TRACT | Status: AC
  Filled 2012-05-11: qty 0.5

## 2012-05-11 MED ORDER — OXYMETAZOLINE HCL 0.05 % NA SOLN
NASAL | Status: AC
  Filled 2012-05-11: qty 15

## 2012-05-11 MED ORDER — OXYCODONE HCL 5 MG/5ML PO SOLN: 5.0000 mg | Freq: Once | ORAL | Status: DC | PRN

## 2012-05-11 MED ORDER — MIDAZOLAM HCL 5 MG/5ML IJ SOLN
INTRAMUSCULAR | Status: DC | PRN
  Administered 2012-05-11: 2 mg via INTRAVENOUS

## 2012-05-11 MED ORDER — OXYCODONE-ACETAMINOPHEN 5-325 MG PO TABS
2.0000 | ORAL_TABLET | Freq: Once | ORAL | Status: AC
  Administered 2012-05-11: 2 via ORAL

## 2012-05-11 MED ORDER — 0.9 % SODIUM CHLORIDE (POUR BTL) OPTIME
TOPICAL | Status: DC | PRN
  Administered 2012-05-11: 1000 mL

## 2012-05-11 MED ORDER — OXYCODONE HCL 5 MG PO TABS: 5.0000 mg | ORAL_TABLET | Freq: Once | ORAL | Status: DC | PRN

## 2012-05-11 MED ORDER — LIDOCAINE-EPINEPHRINE 2 %-1:100000 IJ SOLN
INTRAMUSCULAR | Status: AC
  Filled 2012-05-11: qty 1

## 2012-05-11 MED ORDER — CEFAZOLIN SODIUM 1-5 GM-% IV SOLN
INTRAVENOUS | Status: AC
  Administered 2012-05-11: 1 g via INTRAVENOUS
  Filled 2012-05-11: qty 50

## 2012-05-11 MED ORDER — ROCURONIUM BROMIDE 100 MG/10ML IV SOLN
INTRAVENOUS | Status: DC | PRN
  Administered 2012-05-11: 50 mg via INTRAVENOUS

## 2012-05-11 MED ORDER — NEOSTIGMINE METHYLSULFATE 1 MG/ML IJ SOLN
INTRAMUSCULAR | Status: DC | PRN
  Administered 2012-05-11: 4 mg via INTRAVENOUS

## 2012-05-11 MED ORDER — LIDOCAINE HCL (CARDIAC) 20 MG/ML IV SOLN
INTRAVENOUS | Status: DC | PRN
  Administered 2012-05-11: 100 mg via INTRAVENOUS

## 2012-05-11 MED ORDER — ARTIFICIAL TEARS OP OINT
TOPICAL_OINTMENT | OPHTHALMIC | Status: DC | PRN
  Administered 2012-05-11: 1 via OPHTHALMIC

## 2012-05-11 MED ORDER — PROPOFOL 10 MG/ML IV BOLUS
INTRAVENOUS | Status: DC | PRN
  Administered 2012-05-11: 180 mg via INTRAVENOUS

## 2012-05-11 MED ORDER — LIDOCAINE-EPINEPHRINE 2 %-1:100000 IJ SOLN
INTRAMUSCULAR | Status: DC | PRN
  Administered 2012-05-11: 13 mL

## 2012-05-11 MED ORDER — ALBUTEROL SULFATE (5 MG/ML) 0.5% IN NEBU
2.5000 mg | INHALATION_SOLUTION | Freq: Four times a day (QID) | RESPIRATORY_TRACT | Status: DC | PRN
  Administered 2012-05-11: 2.5 mg via RESPIRATORY_TRACT

## 2012-05-11 MED ORDER — GLYCOPYRROLATE 0.2 MG/ML IJ SOLN
INTRAMUSCULAR | Status: DC | PRN
  Administered 2012-05-11: 0.6 mg via INTRAVENOUS

## 2012-05-11 MED ORDER — ONDANSETRON HCL 4 MG/2ML IJ SOLN
INTRAMUSCULAR | Status: DC | PRN
  Administered 2012-05-11: 4 mg via INTRAVENOUS

## 2012-05-11 MED ORDER — LACTATED RINGERS IV SOLN
INTRAVENOUS | Status: DC | PRN
  Administered 2012-05-11: 07:00:00 via INTRAVENOUS

## 2012-05-11 SURGICAL SUPPLY — 27 items
BUR CROSS CUT FISSURE 1.6 (BURR) ×2 IMPLANT
BUR EGG ELITE 4.0 (BURR) ×2 IMPLANT
CANISTER SUCTION 2500CC (MISCELLANEOUS) ×2 IMPLANT
CLOTH BEACON ORANGE TIMEOUT ST (SAFETY) ×2 IMPLANT
COVER SURGICAL LIGHT HANDLE (MISCELLANEOUS) ×2 IMPLANT
CRADLE DONUT ADULT HEAD (MISCELLANEOUS) ×2 IMPLANT
DECANTER SPIKE VIAL GLASS SM (MISCELLANEOUS) ×2 IMPLANT
GAUZE PACKING FOLDED 2  STR (GAUZE/BANDAGES/DRESSINGS) ×1
GAUZE PACKING FOLDED 2 STR (GAUZE/BANDAGES/DRESSINGS) ×1 IMPLANT
GLOVE BIO SURGEON STRL SZ 6.5 (GLOVE) ×2 IMPLANT
GLOVE BIO SURGEON STRL SZ7.5 (GLOVE) ×2 IMPLANT
GLOVE BIOGEL PI IND STRL 7.0 (GLOVE) ×1 IMPLANT
GLOVE BIOGEL PI INDICATOR 7.0 (GLOVE) ×1
GOWN STRL NON-REIN LRG LVL3 (GOWN DISPOSABLE) ×2 IMPLANT
GOWN STRL REIN XL XLG (GOWN DISPOSABLE) ×2 IMPLANT
KIT BASIN OR (CUSTOM PROCEDURE TRAY) ×2 IMPLANT
KIT ROOM TURNOVER OR (KITS) ×2 IMPLANT
NEEDLE 22X1 1/2 (OR ONLY) (NEEDLE) ×2 IMPLANT
NS IRRIG 1000ML POUR BTL (IV SOLUTION) ×2 IMPLANT
PAD ARMBOARD 7.5X6 YLW CONV (MISCELLANEOUS) ×4 IMPLANT
SUT CHROMIC 3 0 PS 2 (SUTURE) ×4 IMPLANT
SYR CONTROL 10ML LL (SYRINGE) ×2 IMPLANT
TOWEL OR 17X26 10 PK STRL BLUE (TOWEL DISPOSABLE) ×2 IMPLANT
TRAY ENT MC OR (CUSTOM PROCEDURE TRAY) ×2 IMPLANT
TUBING IRRIGATION (MISCELLANEOUS) ×2 IMPLANT
WATER STERILE IRR 1000ML POUR (IV SOLUTION) IMPLANT
YANKAUER SUCT BULB TIP NO VENT (SUCTIONS) ×2 IMPLANT

## 2012-05-11 NOTE — Transfer of Care (Signed)
Immediate Anesthesia Transfer of Care Note  Patient: Tyrone Cunningham  Procedure(s) Performed: Procedure(s): MULTIPLE EXTRACTION WITH ALVEOLOPLASTY (N/A)  Patient Location: PACU  Anesthesia Type:General  Level of Consciousness: awake and alert   Airway & Oxygen Therapy: Patient Spontanous Breathing and Patient connected to tracheostomy mask oxygen  Post-op Assessment: Report given to PACU RN  Post vital signs: Reviewed and stable  Complications: No apparent anesthesia complications

## 2012-05-11 NOTE — Op Note (Signed)
05/11/2012  8:38 AM  PATIENT:  [name]  57 y.o. male  PRE-OPERATIVE DIAGNOSIS:  NON RESTORABLE TEETH #'s 5, 6, 8, 9, 11, 12, 18, 19, 29, 30, 31  POST-OPERATIVE DIAGNOSIS:  SAME  PROCEDURE:  Procedure(s): MULTIPLE EXTRACTION TEETH #'s 5, 6, 8, 9, 11, 12, 18, 19, 29, 30, 31  WITH ALVEOLOPLASTY  SURGEON:  Surgeon(s): [name], DDS  ANESTHESIA:   local and general  EBL:  minimal  DRAINS: none   SPECIMEN:  No Specimen  COUNTS:  YES  PLAN OF CARE: Discharge to home after PACU  PATIENT DISPOSITION:  PACU - hemodynamically stable.   PROCEDURE DETAILS: Dictation # [id number]  [name], DMD 05/11/2012 8:38 AM

## 2012-05-11 NOTE — H&P (Signed)
H&P documentation  -History and Physical Reviewed  -Patient has been re-examined  -No change in the plan of care  Tyrone Cunningham M  

## 2012-05-11 NOTE — Preoperative (Signed)
Beta Blockers   Reason not to administer Beta Blockers:Not Applicable 

## 2012-05-11 NOTE — Anesthesia Postprocedure Evaluation (Signed)
  Anesthesia Post-op Note  Patient: Tyrone Cunningham  Procedure(s) Performed: Procedure(s): MULTIPLE EXTRACTION WITH ALVEOLOPLASTY (N/A)  Patient Location: PACU  Anesthesia Type:General  Level of Consciousness: awake and alert   Airway and Oxygen Therapy: Patient Spontanous Breathing  Post-op Pain: mild  Post-op Assessment: Post-op Vital signs reviewed, Patient's Cardiovascular Status Stable, Respiratory Function Stable, Patent Airway, No signs of Nausea or vomiting and Pain level controlled  Post-op Vital Signs: stable  Complications: No apparent anesthesia complications

## 2012-05-11 NOTE — Anesthesia Preprocedure Evaluation (Addendum)
Anesthesia Evaluation  Patient identified by MRN, date of birth, ID band Patient awake    Reviewed: Allergy & Precautions, H&P , NPO status , Patient's Chart, lab work & pertinent test results  Airway       Dental   Pulmonary sleep apnea , COPDformer smoker,  + rhonchi   + decreased breath sounds      Cardiovascular hypertension, Pt. on medications +CHF Rhythm:Regular Rate:Normal     Neuro/Psych  Neuromuscular disease    GI/Hepatic   Endo/Other  diabetes, Type 2, Insulin Dependent  Renal/GU      Musculoskeletal   Abdominal (+) + obese,   Peds  Hematology   Anesthesia Other Findings Trach in place  Reproductive/Obstetrics                          Anesthesia Physical Anesthesia Plan  ASA: III  Anesthesia Plan: General   Post-op Pain Management:    Induction: Intravenous  Airway Management Planned: Tracheostomy  Additional Equipment:   Intra-op Plan:   Post-operative Plan: Extubation in OR  Informed Consent: I have reviewed the patients History and Physical, chart, labs and discussed the procedure including the risks, benefits and alternatives for the proposed anesthesia with the patient or authorized representative who has indicated his/her understanding and acceptance.   Dental advisory given  Plan Discussed with: CRNA and Surgeon  Anesthesia Plan Comments:        Anesthesia Quick Evaluation

## 2012-05-12 NOTE — Op Note (Signed)
Tyrone Cunningham, Tyrone Cunningham                  ACCOUNT NO.:  [id number]  MEDICAL RECORD NO.:  [id number]  LOCATION:  [facility]                         FACILITY:  [facility]  PHYSICIAN:  [name], M.D.  DATE OF BIRTH:  [birth year]  DATE OF PROCEDURE:  05/11/2012 DATE OF DISCHARGE:  05/11/2012                              OPERATIVE REPORT   PREOPERATIVE DIAGNOSIS:  Nonrestorable teeth #5, #6, #8, #9, #11, #12, #18, #19, #29, #30, #31.  POSTOPERATIVE DIAGNOSIS:  Nonrestorable teeth #5, #6, #8, #9, #11, #12, #18, #19, #29, #30, #31.  PROCEDURE:  Extraction of teeth #5, #6, #8, #9, #11, #12, #18, #19, #29, #30, #31, alveoplasty right and left maxilla and mandible.  SURGEON:  [name], M.D.  ANESTHESIA:  General, Dr. Gypsy Balsam attending.  INDICATIONS FOR PROCEDURE:  Tyrone Cunningham is a 57 year old male who is referred to me by his general dentist for multiple extractions of nonrestorable teeth.  He has significant past medical history of morbid obesity, obstructive sleep apnea, has a tracheostomy in place.  He is diabetic. Because of his multiple medical problems, it was recommended that general anesthesia be used at the hospital for airway protection and the patient's safety.  PROCEDURE:  The patient was taken to the operating room, placed on the table in a supine position.  General anesthesia was administered intravenously and endotracheal tube was placed through the tracheostomy opening.  The patient was draped for the procedure and then a time-out was performed.  Then the posterior pharynx was suctioned and a throat pack was placed.  Then lidocaine 2% 1:100,000 epinephrine was infiltrated in an inferior alveolar block on the right and left side and buccal and palatal infiltration in the maxilla around the teeth to be removed, total of 14 mL was utilized with a solution.  A bite block was placed in the right side of the mouth and a sweetheart retractor was used to retract the tongue.  A 15 blade was  used make a full-thickness incision around teeth #18 and #19, and carried anteriorly to tooth #21 in the mandible and maxilla.  An incision was made around teeth #8, #9, #11, and #12.  Periosteum was reflected and then interproximal bone was removed around teeth #18 and #19, and around #10 and #11 the teeth were elevated with a 301 elevator and removed in part with the lower #151 forceps and the upper #150 forceps.  Parts of teeth #18 and #19 fractured off and additional bone was removed with the [employer]ker handpiece and fissure bur under irrigation.  Root tip picks were used to remove residual portions of the root tips.  In the maxilla, additional bone was removed after these teeth fractured and they were eventually removed with the 301 elevator and a rongeur.  Then the periosteum was further reflected to expose the alveolar crest in the maxilla mandible on the left side, and an egg-shaped bur was used under irrigation to perform an alveoplasty.  Then, the areas were irrigated and closed with 3-0 chromic.  The bite block and sweetheart were replaced on the other side of the mouth.  A 15 blade was used to make an incision around teeth #5  and #6, and around teeth #29, #30, #31.  The periosteum was reflected with a periosteal elevator.  Interproximal bone was removed with a handpiece under irrigation.  The teeth were elevated with a 301 elevator and forceps were used to remove the teeth.  Portions of teeth fractured and were removed by removing additional bone with the fissure hand piece bur under irrigation and 301 elevator and the rongeurs.  Then, the periosteum was further reflected and the alveolar crest was exposed and egg-shaped bur was used to perform the alveoplasty in the right maxilla and mandible.  Then, the areas were irrigated and closed with 3-0 chromic.  The oral cavity was inspected, found to have good contour, hemostasis, and closure.  The oral cavity was irrigated and  suctioned.  Throat pack was removed.  The patient was awakened and taken to the recovery room breathing spontaneously in good condition.  EBL minimum.  Complications none.  Specimens none.     [name], M.D.     SMJ/MEDQ  D:  05/11/2012  T:  05/12/2012  Job:  [id number]

## 2012-05-15 ENCOUNTER — Encounter (HOSPITAL_COMMUNITY): Payer: Self-pay | Admitting: Oral Surgery

## 2012-05-17 ENCOUNTER — Ambulatory Visit
Admission: RE | Admit: 2012-05-17 | Discharge: 2012-05-17 | Disposition: A | Discharge status: Home / Self Care | Payer: Medicare Other | Source: Ambulatory Visit | Attending: Pain Medicine | Admitting: Pain Medicine

## 2012-05-20 DIAGNOSIS — Z79899 Other long term (current) drug therapy: Secondary | ICD-10-CM | POA: Diagnosis not present

## 2012-05-20 DIAGNOSIS — R209 Unspecified disturbances of skin sensation: Secondary | ICD-10-CM | POA: Diagnosis not present

## 2012-05-20 DIAGNOSIS — M79609 Pain in unspecified limb: Secondary | ICD-10-CM | POA: Diagnosis not present

## 2012-05-20 DIAGNOSIS — G894 Chronic pain syndrome: Secondary | ICD-10-CM | POA: Diagnosis not present

## 2012-05-20 DIAGNOSIS — M25569 Pain in unspecified knee: Secondary | ICD-10-CM | POA: Diagnosis not present

## 2012-05-20 DIAGNOSIS — M542 Cervicalgia: Secondary | ICD-10-CM | POA: Diagnosis not present

## 2012-05-26 ENCOUNTER — Other Ambulatory Visit: Payer: Self-pay | Admitting: Physician Assistant

## 2012-05-26 DIAGNOSIS — Z930 Tracheostomy status: Secondary | ICD-10-CM | POA: Diagnosis not present

## 2012-05-26 DIAGNOSIS — H7200 Central perforation of tympanic membrane, unspecified ear: Secondary | ICD-10-CM | POA: Diagnosis not present

## 2012-05-26 DIAGNOSIS — G4733 Obstructive sleep apnea (adult) (pediatric): Secondary | ICD-10-CM | POA: Diagnosis not present

## 2012-05-28 ENCOUNTER — Telehealth: Payer: Self-pay

## 2012-05-28 ENCOUNTER — Other Ambulatory Visit: Payer: Self-pay | Admitting: Physician Assistant

## 2012-05-28 MED ORDER — INSULIN REGULAR HUMAN 100 UNIT/ML IJ SOLN: 12.0000 [IU] | Freq: Three times a day (TID) | INTRAMUSCULAR | Status: DC

## 2012-05-28 NOTE — Telephone Encounter (Signed)
It is okay to refill this medication. It was prescribed while he was in the hospital and is necessary for control of his diabetes.

## 2012-05-28 NOTE — Telephone Encounter (Signed)
He is to take 12-15 units prior to each meal. They can dispense a one-month supply with refills x1 year

## 2012-05-28 NOTE — Telephone Encounter (Addendum)
Patient is upset we denied pharmacies request to refill his  insulin regular (NOVOLIN R,HUMULIN R) 100 units/mL injection Not prescribed by our ppractice [phone]

## 2012-05-28 NOTE — Telephone Encounter (Signed)
I need you to dose this, patient does not know dosage.

## 2012-06-02 ENCOUNTER — Other Ambulatory Visit: Payer: Self-pay | Admitting: Physician Assistant

## 2012-06-07 ENCOUNTER — Other Ambulatory Visit: Payer: Self-pay | Admitting: Physician Assistant

## 2012-06-08 ENCOUNTER — Encounter: Payer: Self-pay | Admitting: Emergency Medicine

## 2012-06-08 ENCOUNTER — Ambulatory Visit (INDEPENDENT_AMBULATORY_CARE_PROVIDER_SITE_OTHER): Payer: Medicare Other | Admitting: Emergency Medicine

## 2012-06-08 VITALS — BP 130/66 | HR 104 | Temp 99.6°F | Resp 20 | Wt 351.0 lb

## 2012-06-08 DIAGNOSIS — I10 Essential (primary) hypertension: Secondary | ICD-10-CM | POA: Diagnosis not present

## 2012-06-08 DIAGNOSIS — E669 Obesity, unspecified: Secondary | ICD-10-CM

## 2012-06-08 DIAGNOSIS — IMO0001 Reserved for inherently not codable concepts without codable children: Secondary | ICD-10-CM

## 2012-06-08 DIAGNOSIS — G4730 Sleep apnea, unspecified: Secondary | ICD-10-CM | POA: Diagnosis not present

## 2012-06-08 DIAGNOSIS — E119 Type 2 diabetes mellitus without complications: Secondary | ICD-10-CM

## 2012-06-08 LAB — POCT GLYCOSYLATED HEMOGLOBIN (HGB A1C): Hemoglobin A1C: 9.7

## 2012-06-08 LAB — GLUCOSE, POCT (MANUAL RESULT ENTRY): POC Glucose: 242 mg/dl — AB (ref 70–99)

## 2012-06-08 NOTE — Progress Notes (Signed)
  Subjective:    Patient ID: Tyrone Cunningham, male    DOB: 12-14-1955, 57 y.o.   MRN: [id number]  HPI Here for routine follow up. Reports blood pressure has been good. Sugars have not been controlled. Is taking between 15-21 units of insulin. 90 of lantus daily.  Thought he had infection around trach site. Reports pain in lower right side of it. Had difficulty swallowing recently, but it has resolved.   Review of Systems     Objective:   Physical Exam patient is alert and cooperative. His neck is supple the area around trach site looks normal there is no redness. There is a minimal amount of thick  Mucous  material in the floor of the tracheostomy site. Breath sounds are diminished in the bases. Cardiac is regular examination of the feet reveals scaling over the skin bilaterally there are stasis changes bilaterally. There are no ulcerations seen. Capillary refill is less than 3 seconds bilaterally  Results for orders placed in visit on 06/08/12  GLUCOSE, POCT (MANUAL RESULT ENTRY)      Result Value Range   POC Glucose 242 (*) 70 - 99 mg/dl  POCT GLYCOSYLATED HEMOGLOBIN (HGB A1C)      Result Value Range   Hemoglobin A1C 9.7          Assessment & Plan:  Will go ahead and check glucose and A1c. His major issue is inability to exercise, poor eating habits. and persistent with weight issues.

## 2012-06-14 ENCOUNTER — Other Ambulatory Visit: Payer: Self-pay | Admitting: Physician Assistant

## 2012-06-16 ENCOUNTER — Telehealth: Payer: Self-pay

## 2012-06-16 NOTE — Telephone Encounter (Signed)
The forms are in box at TL desk.

## 2012-06-16 NOTE — Telephone Encounter (Signed)
The patient called stating [facility] sent over paperwork for his power chair and would like to know if it has been received. Pt states they resent it again today. Pt has req call back at [phone].

## 2012-06-17 ENCOUNTER — Telehealth: Payer: Self-pay

## 2012-06-17 DIAGNOSIS — M542 Cervicalgia: Secondary | ICD-10-CM | POA: Diagnosis not present

## 2012-06-17 DIAGNOSIS — G894 Chronic pain syndrome: Secondary | ICD-10-CM | POA: Diagnosis not present

## 2012-06-17 DIAGNOSIS — Z79899 Other long term (current) drug therapy: Secondary | ICD-10-CM | POA: Diagnosis not present

## 2012-06-17 DIAGNOSIS — M79609 Pain in unspecified limb: Secondary | ICD-10-CM | POA: Diagnosis not present

## 2012-06-17 NOTE — Telephone Encounter (Signed)
Tried to call, according to paperwork, patient must have face to face exam for wheelchair evaluation, can you please call with appt options? I can talk to him if he has questions, Tyrone Cunningham

## 2012-06-17 NOTE — Telephone Encounter (Signed)
Pt is calling back because he had a missed call. Call back number is [phone]

## 2012-06-17 NOTE — Telephone Encounter (Signed)
Told him he needs appt, he should be contacted with appt schedule.

## 2012-06-17 NOTE — Telephone Encounter (Signed)
His diagnosis is morbid obesity. 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 Peripheral neuropathy. Uncontrolled diabetes. Obstructive sleep apnea. I would be happy to put more information on his last office visit if you can help me get into his chart and I can dictate an addendum .

## 2012-06-17 NOTE — Telephone Encounter (Signed)
What is diagnosis for the power chair? Also we need to do an addendum to last office visit, indicating you did see him face to face and he needs replacement for the wheelchair, please see me about this. Fatema Rabe

## 2012-06-20 ENCOUNTER — Other Ambulatory Visit: Payer: Self-pay | Admitting: Physician Assistant

## 2012-06-21 NOTE — Telephone Encounter (Signed)
Appt made with Dr. Cleta Alberts for 4/28.

## 2012-06-24 ENCOUNTER — Other Ambulatory Visit: Payer: Self-pay | Admitting: *Deleted

## 2012-06-28 ENCOUNTER — Encounter: Payer: Self-pay | Admitting: Emergency Medicine

## 2012-06-28 ENCOUNTER — Ambulatory Visit (INDEPENDENT_AMBULATORY_CARE_PROVIDER_SITE_OTHER): Payer: Medicare Other | Admitting: Emergency Medicine

## 2012-06-28 VITALS — BP 116/79 | HR 109 | Temp 99.1°F | Resp 18 | Wt 351.0 lb

## 2012-06-28 DIAGNOSIS — G4733 Obstructive sleep apnea (adult) (pediatric): Secondary | ICD-10-CM | POA: Diagnosis not present

## 2012-06-28 DIAGNOSIS — R269 Unspecified abnormalities of gait and mobility: Secondary | ICD-10-CM | POA: Diagnosis not present

## 2012-06-28 DIAGNOSIS — R52 Pain, unspecified: Secondary | ICD-10-CM

## 2012-06-28 DIAGNOSIS — Z9989 Dependence on other enabling machines and devices: Secondary | ICD-10-CM

## 2012-06-28 NOTE — Progress Notes (Signed)
  Subjective:    Patient ID: Tyrone Cunningham, male    DOB: 03/13/1955, 58 y.o.   MRN: [id number]  HPI patient enters for evaluation of his status to see if he qualifies for a power wheelchair.Marland Kitchen He has severe COPD with obstructive sleep apnea and  has an indwelling tracheostomy tube. He has diabetes that has been poorly controlled. He has morbid obesity and recurrent need for O2 due to hypoxia. He also suffers from congestive heart failure. He is status post compound fracture of the left femur and because of this is on chronic pain management. He is essentially confined to his power  Wheelchair.. His pain level in the lower extremities ranges from 8-10 with any activity. He also complains of pain in the upper extremities when he has to use his arms for any activity. With activity his upper extremity pain is also a level of 5-6/10. He cannot walk more than 3-4 steps without shortness of breath. He is unstable with the use of a walker due to this history of left femur fracture. He is unable to use a manual wheelchair due to his upper extremity weakness and pain. His apartment is not suitable to accommodate a power scooter    Review of Systems     Objective:   Physical Exam patient is alert and cooperative. He is able to raise his arms over his head but complains of pain with this motion. He has a tracheostomy tube in place. He is unable to lift his left leg due to pain. I assisted him to a standing position and he was not able to ambulate due to to the pain in the lower extremities as well as his significant instability. His standing is extremely wide based. He cannot bear weight on the right or left leg for more than one to 2 seconds. Strength of the upper extremities is 4/5.Marland Kitchen Strength of the lower extremities is 3/5 both legs.        Assessment & Plan:  This is a patient with severe obstructive sleep apnea status post tracheostomy with hypoxia .He is O2 dependent. He has an unstable gait. His pain level is  between 8 to 10 out of 10 despite being under care of pain management. He is unable to manipulate a manual wheelchair. He is unable to use his upper extremities for any period of time. He is unable to perform ADLs without the assistance of his power chair. He is unable to get to the bathroom without the assistance of his power chair the only way he can continue to be independent is to have a power wheelchair .

## 2012-06-29 ENCOUNTER — Encounter: Payer: Self-pay | Admitting: Critical Care Medicine

## 2012-06-29 ENCOUNTER — Ambulatory Visit (INDEPENDENT_AMBULATORY_CARE_PROVIDER_SITE_OTHER): Payer: Medicare Other | Admitting: Critical Care Medicine

## 2012-06-29 VITALS — BP 136/62 | HR 102 | Temp 98.9°F | Ht 65.0 in

## 2012-06-29 DIAGNOSIS — J4489 Other specified chronic obstructive pulmonary disease: Secondary | ICD-10-CM

## 2012-06-29 DIAGNOSIS — J449 Chronic obstructive pulmonary disease, unspecified: Secondary | ICD-10-CM

## 2012-06-29 MED ORDER — ALBUTEROL SULFATE (2.5 MG/3ML) 0.083% IN NEBU: 2.5000 mg | INHALATION_SOLUTION | Freq: Four times a day (QID) | RESPIRATORY_TRACT | Status: DC

## 2012-06-29 NOTE — Assessment & Plan Note (Addendum)
Gold stage D. COPD with stable airway function and chronic tracheotomy status due to severe sleep apnea Plan Maintain nebulized therapy as prescribed Maintain tracheotomy status as prescribed Return 6  months

## 2012-06-29 NOTE — Patient Instructions (Addendum)
No change in medications. Return in        6 months        

## 2012-06-29 NOTE — Progress Notes (Signed)
Subjective:    Patient ID: Tyrone Cunningham, male    DOB: Mar 25, 1955, 57 y.o.   MRN: [id number]  HPI  57 y.o. WM   Copd. Trach dependent.   Pt last seen PW 7yrs ago. Dr Cleta Alberts has been managing the pt.  MOre recently had to go to hosp one month ago for lung infection.   Pt exposed to passive smoke exposure.  Not much mucus out of trach.  Trach in place since 2001 for OSA.    No real chest pain.  Has not seen cardiology.  Pt notes some edema in legs.   Notes some dyspnea at rest.  Pt has oxygen at night.  [facility] is DME.    06/29/2012 No new issues.    Pt denies any significant sore throat, nasal congestion or excess secretions, fever, chills, sweats, unintended weight loss, pleurtic or exertional chest pain, orthopnea PND, or leg swelling Pt denies any increase in rescue therapy over baseline, denies waking up needing it or having any early am or nocturnal exacerbations of coughing/wheezing/or dyspnea. Pt also denies any obvious fluctuation in symptoms with  weather or environmental change or other alleviating or aggravating factors   Past Medical History  Diagnosis Date  . Bronchitis   . CHF (congestive heart failure)   . DM type 2 with diabetic peripheral neuropathy   . Hypertension   . Hypercholesteremia   . Hearing loss   . Peripheral neuropathy   . Sleep apnea     Severe sleep apnea requiring tracheostomy  . COPD (chronic obstructive pulmonary disease)      Family History  Problem Relation Age of Onset  . Coronary artery disease Father      History   Social History  . Marital Status: Single    Spouse Name: N/A    Number of Children: N/A  . Years of Education: N/A   Occupational History  . Not on file.   Social History Main Topics  . Smoking status: Former Smoker -- 2.00 packs/day for 30 years    Types: Cigarettes    Quit date: 07/02/1999  . Smokeless tobacco: Never Used  . Alcohol Use: No  . Drug Use: No  . Sexually Active: No   Other Topics Concern  . Not on file    Social History Narrative  . No narrative on file     No Known Allergies   Outpatient Prescriptions Prior to Visit  Medication Sig Dispense Refill  . atorvastatin (LIPITOR) 10 MG tablet TAKE 1 TABLET BY MOUTH DAILY  90 tablet  0  . Fluticasone-Salmeterol (ADVAIR) 500-50 MCG/DOSE AEPB Inhale 1 puff into the lungs every 12 (twelve) hours.      . furosemide (LASIX) 40 MG tablet TAKE 1 TABLET BY MOUTH TWICE DAILY  60 tablet  0  . gabapentin (NEURONTIN) 300 MG capsule Take 300-600 mg by mouth 3 (three) times daily. Take 2 capsules in the morning 1 capsule at lunch and 2 capsules in the evening      . Insulin Syringe-Needle U-100 (B-D INS SYR ULTRAFINE 1CC/31G) 31G X 5/16" 1 ML MISC Use as Directed  100 each  1  . LANTUS 100 UNIT/ML injection INJECT 90 UNITS SUBCUTANEOUSLY DAILY  30 mL  2  . lisinopril (PRINIVIL,ZESTRIL) 5 MG tablet TAKE 1 TABLET BY MOUTH DAILY  90 tablet  0  . polyethylene glycol powder (GLYCOLAX/MIRALAX) powder DRINK 1 CAPFUL OF POWDER MIXED IN 8 OUNCES OF WATER EVERY DAY  255 g  5  .  pregabalin (LYRICA) 75 MG capsule Take 75 mg by mouth at bedtime.      Marland Kitchen spironolactone (ALDACTONE) 25 MG tablet Take 1 tablet (25 mg total) by mouth daily.  90 tablet  0  . tiotropium (SPIRIVA HANDIHALER) 18 MCG inhalation capsule Place 1 capsule (18 mcg total) into inhaler and inhale daily.  30 capsule  5  . traMADol (ULTRAM) 50 MG tablet Take 100 mg by mouth every 6 (six) hours as needed for pain.      . furosemide (LASIX) 40 MG tablet Take 40 mg by mouth 2 (two) times daily.      . insulin regular (NOVOLIN R,HUMULIN R) 100 units/mL injection Inject 0.12-0.15 mLs (12-15 Units total) into the skin 3 (three) times daily before meals.  10 mL  11  . polyethylene glycol (MIRALAX / GLYCOLAX) packet Take 17 g by mouth daily.      Marland Kitchen lisinopril (PRINIVIL,ZESTRIL) 10 MG tablet Take 1 tablet (10 mg total) by mouth daily.  90 tablet  0  . Multiple Vitamin (MULTIVITAMIN) tablet Take 1 tablet by mouth  daily.      Marland Kitchen oxyCODONE-acetaminophen (PERCOCET) 10-325 MG per tablet Take 1-2 tablets by mouth every 4 (four) hours as needed for pain.  40 tablet  0   No facility-administered medications prior to visit.     Review of Systems  Constitutional: Positive for fatigue. Negative for fever, chills, diaphoresis, activity change, appetite change and unexpected weight change.  HENT: Positive for congestion, rhinorrhea, sneezing and dental problem. Negative for hearing loss, ear pain, nosebleeds, sore throat, facial swelling, mouth sores, trouble swallowing, neck pain, neck stiffness, voice change, postnasal drip, sinus pressure, tinnitus and ear discharge.   Eyes: Negative for photophobia, discharge, itching and visual disturbance.  Respiratory: Positive for cough and shortness of breath. Negative for apnea, choking, chest tightness, wheezing and stridor.   Cardiovascular: Positive for chest pain and palpitations. Negative for leg swelling.  Gastrointestinal: Negative for nausea, vomiting, abdominal pain, constipation, blood in stool and abdominal distention.  Genitourinary: Negative for dysuria, urgency, frequency, hematuria, flank pain, decreased urine volume and difficulty urinating.  Musculoskeletal: Positive for back pain and gait problem. Negative for myalgias, joint swelling and arthralgias.  Skin: Negative for color change, pallor and rash.  Neurological: Negative for dizziness, tremors, seizures, syncope, speech difficulty, weakness, light-headedness, numbness and headaches.  Hematological: Negative for adenopathy. Does not bruise/bleed easily.  Psychiatric/Behavioral: Positive for sleep disturbance. Negative for confusion and agitation. The patient is not nervous/anxious.        Objective:   Physical Exam  Filed Vitals:   06/29/12 1110  BP: 136/62  Pulse: 102  Temp: 98.9 F (37.2 C)  TempSrc: Oral  Height: 5\' 5"  (1.651 m)  SpO2: 93%    Gen: obese , well-nourished, in no  distress,  normal affect,metal  trach in place  ENT: No lesions,  mouth clear,  oropharynx clear, no postnasal drip  Neck: No JVD, no TMG, no carotid bruits  Lungs: No use of accessory muscles, no dullness to percussion, distant BS  Cardiovascular: RRR, heart sounds normal, no murmur or gallops, 2+  peripheral edema  Abdomen: soft and NT, no HSM,  BS normal  Musculoskeletal: No deformities, no cyanosis or clubbing  Neuro: alert, non focal  Skin: Warm, no lesions or rashes  No results found.        Assessment & Plan:   [name] with frequent exacerbations Gold stage D. COPD with stable airway function and chronic  tracheotomy status due to severe sleep apnea Plan Maintain nebulized therapy as prescribed Maintain tracheotomy status as prescribed Return 6  months    Updated Medication List Outpatient Encounter Prescriptions as of 06/29/2012  Medication Sig Dispense Refill  . albuterol (PROVENTIL) (2.5 MG/3ML) 0.083% nebulizer solution Take 3 mLs (2.5 mg total) by nebulization 4 (four) times daily.  270 mL  6  . atorvastatin (LIPITOR) 10 MG tablet TAKE 1 TABLET BY MOUTH DAILY  90 tablet  0  . Fluticasone-Salmeterol (ADVAIR) 500-50 MCG/DOSE AEPB Inhale 1 puff into the lungs every 12 (twelve) hours.      . furosemide (LASIX) 40 MG tablet TAKE 1 TABLET BY MOUTH TWICE DAILY  60 tablet  0  . gabapentin (NEURONTIN) 300 MG capsule Take 300-600 mg by mouth 3 (three) times daily. Take 2 capsules in the morning 1 capsule at lunch and 2 capsules in the evening      . insulin regular (NOVOLIN R,HUMULIN R) 100 units/mL injection Inject 21-24 Units into the skin 4 (four) times daily - after meals and at bedtime.      . Insulin Syringe-Needle U-100 (B-D INS SYR ULTRAFINE 1CC/31G) 31G X 5/16" 1 ML MISC Use as Directed  100 each  1  . LANTUS 100 UNIT/ML injection INJECT 90 UNITS SUBCUTANEOUSLY DAILY  30 mL  2  . lisinopril (PRINIVIL,ZESTRIL) 5 MG tablet TAKE 1 TABLET BY MOUTH DAILY  90  tablet  0  . polyethylene glycol powder (GLYCOLAX/MIRALAX) powder DRINK 1 CAPFUL OF POWDER MIXED IN 8 OUNCES OF WATER EVERY DAY  255 g  5  . pregabalin (LYRICA) 75 MG capsule Take 75 mg by mouth at bedtime.      Marland Kitchen spironolactone (ALDACTONE) 25 MG tablet Take 1 tablet (25 mg total) by mouth daily.  90 tablet  0  . tiotropium (SPIRIVA HANDIHALER) 18 MCG inhalation capsule Place 1 capsule (18 mcg total) into inhaler and inhale daily.  30 capsule  5  . traMADol (ULTRAM) 50 MG tablet Take 100 mg by mouth every 6 (six) hours as needed for pain.      Marland Kitchen VOLTAREN 1 % GEL as needed.      . [DISCONTINUED] albuterol (PROVENTIL) (2.5 MG/3ML) 0.083% nebulizer solution Take 2.5 mg by nebulization 4 (four) times daily.      . [DISCONTINUED] furosemide (LASIX) 40 MG tablet Take 40 mg by mouth 2 (two) times daily.      . [DISCONTINUED] insulin regular (NOVOLIN R,HUMULIN R) 100 units/mL injection Inject 0.12-0.15 mLs (12-15 Units total) into the skin 3 (three) times daily before meals.  10 mL  11  . [DISCONTINUED] polyethylene glycol (MIRALAX / GLYCOLAX) packet Take 17 g by mouth daily.      . [DISCONTINUED] lisinopril (PRINIVIL,ZESTRIL) 10 MG tablet Take 1 tablet (10 mg total) by mouth daily.  90 tablet  0  . [DISCONTINUED] Multiple Vitamin (MULTIVITAMIN) tablet Take 1 tablet by mouth daily.      . [DISCONTINUED] oxyCODONE-acetaminophen (PERCOCET) 10-325 MG per tablet Take 1-2 tablets by mouth every 4 (four) hours as needed for pain.  40 tablet  0   No facility-administered encounter medications on file as of 06/29/2012.

## 2012-07-01 ENCOUNTER — Other Ambulatory Visit: Payer: Self-pay | Admitting: Physician Assistant

## 2012-07-03 ENCOUNTER — Other Ambulatory Visit: Payer: Self-pay | Admitting: Physician Assistant

## 2012-07-15 DIAGNOSIS — M1990 Unspecified osteoarthritis, unspecified site: Secondary | ICD-10-CM | POA: Diagnosis not present

## 2012-07-15 DIAGNOSIS — G894 Chronic pain syndrome: Secondary | ICD-10-CM | POA: Diagnosis not present

## 2012-07-15 DIAGNOSIS — IMO0001 Reserved for inherently not codable concepts without codable children: Secondary | ICD-10-CM | POA: Diagnosis not present

## 2012-07-15 DIAGNOSIS — Z79899 Other long term (current) drug therapy: Secondary | ICD-10-CM | POA: Diagnosis not present

## 2012-07-25 ENCOUNTER — Other Ambulatory Visit: Payer: Self-pay | Admitting: Physician Assistant

## 2012-07-27 ENCOUNTER — Other Ambulatory Visit: Payer: Self-pay | Admitting: Physician Assistant

## 2012-07-31 ENCOUNTER — Other Ambulatory Visit: Payer: Self-pay | Admitting: Physician Assistant

## 2012-08-05 ENCOUNTER — Telehealth: Payer: Self-pay | Admitting: Critical Care Medicine

## 2012-08-05 DIAGNOSIS — Z930 Tracheostomy status: Secondary | ICD-10-CM

## 2012-08-05 DIAGNOSIS — J449 Chronic obstructive pulmonary disease, unspecified: Secondary | ICD-10-CM

## 2012-08-05 NOTE — Telephone Encounter (Signed)
lmomtcb for Melissa to verify msg.

## 2012-08-05 NOTE — Telephone Encounter (Signed)
Order placed lmomtcb to inform Tyrone Cunningham

## 2012-08-05 NOTE — Telephone Encounter (Signed)
i am ok with this order 

## 2012-08-05 NOTE — Telephone Encounter (Signed)
Spoke with Melissa with [facility].  States pt lost or something happened to his inner cannula.  [facility] is not sure if they can order the inner cannula and trach separate so they would like an order for both.  Requesting order for a #7 Jackson trach and inner cannula.  Dr. Delford Field, pls advise if you are ok with this for pt.  Thank you.

## 2012-08-06 NOTE — Telephone Encounter (Signed)
Spoke with Tyrone Cunningham  She is aware order was placed Nothing further needed

## 2012-08-12 DIAGNOSIS — G894 Chronic pain syndrome: Secondary | ICD-10-CM | POA: Diagnosis not present

## 2012-08-12 DIAGNOSIS — Z79899 Other long term (current) drug therapy: Secondary | ICD-10-CM | POA: Diagnosis not present

## 2012-08-12 DIAGNOSIS — G5690 Unspecified mononeuropathy of unspecified upper limb: Secondary | ICD-10-CM | POA: Diagnosis not present

## 2012-08-12 DIAGNOSIS — G609 Hereditary and idiopathic neuropathy, unspecified: Secondary | ICD-10-CM | POA: Diagnosis not present

## 2012-08-27 ENCOUNTER — Other Ambulatory Visit: Payer: Self-pay | Admitting: Physician Assistant

## 2012-08-27 ENCOUNTER — Other Ambulatory Visit: Payer: Self-pay | Admitting: Emergency Medicine

## 2012-08-30 ENCOUNTER — Ambulatory Visit (INDEPENDENT_AMBULATORY_CARE_PROVIDER_SITE_OTHER): Payer: Medicare Other | Admitting: Emergency Medicine

## 2012-08-30 ENCOUNTER — Ambulatory Visit: Payer: Medicare Other

## 2012-08-30 VITALS — BP 131/79 | HR 116 | Temp 99.0°F | Resp 24

## 2012-08-30 DIAGNOSIS — R05 Cough: Secondary | ICD-10-CM

## 2012-08-30 DIAGNOSIS — E119 Type 2 diabetes mellitus without complications: Secondary | ICD-10-CM

## 2012-08-30 DIAGNOSIS — J209 Acute bronchitis, unspecified: Secondary | ICD-10-CM | POA: Diagnosis not present

## 2012-08-30 DIAGNOSIS — R059 Cough, unspecified: Secondary | ICD-10-CM | POA: Diagnosis not present

## 2012-08-30 LAB — POCT CBC
Hemoglobin: 13.3 g/dL — AB (ref 14.1–18.1)
MCH, POC: 28.5 pg (ref 27–31.2)
MPV: 9.8 fL (ref 0–99.8)
POC MID %: 5.8 %M (ref 0–12)
RBC: 4.67 M/uL — AB (ref 4.69–6.13)
WBC: 9.5 10*3/uL (ref 4.6–10.2)

## 2012-08-30 LAB — BASIC METABOLIC PANEL
BUN: 27 mg/dL — ABNORMAL HIGH (ref 6–23)
Chloride: 94 mEq/L — ABNORMAL LOW (ref 96–112)
Potassium: 4.8 mEq/L (ref 3.5–5.3)

## 2012-08-30 MED ORDER — DOXYCYCLINE HYCLATE 100 MG PO CAPS: 100.0000 mg | ORAL_CAPSULE | Freq: Two times a day (BID) | ORAL | Status: DC

## 2012-08-30 NOTE — Progress Notes (Signed)
  Subjective:    Patient ID: Tyrone Cunningham, male    DOB: [birth year], 57 y.o.   MRN: [id number]  HPI patient was in his usual state of health until 6-7 days ago when he developed a productive cough. This has persisted. He is known to have gold D. COPD. He is status post tracheostomy for obstructive sleep apnea. The last time he was admitted to the hospital culture grew staph aureus which was MSSA. He states he is missing the inner piece to this tracheostomy tube and is due to get that tomorrow.    Review of Systems     Objective:   Physical Exam Tyrone Cunningham looks at baseline today. He is not in any respiratory distress. Tracheostomy site appears normal. Chest exam of the abdomen is present in the bases without true rales. His cardiac exam revealed tachycardia without murmur. Extremities show 2+ edema to the knee.  [facility] reading (PRIMARY) by  Dr. Cleta Alberts Chest x-ray shows no significant infiltrates in the upper lobes there are bilateral patchy lower lobe infiltrates with a large heart. Interpretation is difficult due to patient's size. Patient is presenting with an acute exacerbation of chronic bronchitis. His last culture was staph aureus.   Results for orders placed in visit on 06/08/12  GLUCOSE, POCT (MANUAL RESULT ENTRY)      Result Value Range   POC Glucose 242 (*) 70 - 99 mg/dl  POCT GLYCOSYLATED HEMOGLOBIN (HGB A1C)      Result Value Range   Hemoglobin A1C 9.7     Results for orders placed in visit on 08/30/12  POCT CBC      Result Value Range   WBC 9.5  4.6 - 10.2 K/uL   Lymph, poc 1.6  0.6 - 3.4   POC LYMPH PERCENT 16.5  10 - 50 %L   MID (cbc) 0.6  0 - 0.9   POC MID % 5.8  0 - 12 %M   POC Granulocyte 7.4 (*) 2 - 6.9   Granulocyte percent 77.7  37 - 80 %G   RBC 4.67 (*) 4.69 - 6.13 M/uL   Hemoglobin 13.3 (*) 14.1 - 18.1 g/dL   HCT, POC 03.4 (*) 74.2 - 53.7 %   MCV 91.6  80 - 97 fL   MCH, POC 28.5  27 - 31.2 pg   MCHC 31.1 (*) 31.8 - 35.4 g/dL   RDW, POC 59.5     Platelet Count, POC 228   142 - 424 K/uL   MPV 9.8  0 - 99.8 fL  GLUCOSE, POCT (MANUAL RESULT ENTRY)      Result Value Range   POC Glucose 313 (*) 70 - 99 mg/dl       Assessment & Plan:     Will treat with Doxycycline 100mg  BID. He will continue Advair, Spiriva, Albuterol treatment. Home O2. Sugar continues to be out of control. Culture was done of purulent sputum.

## 2012-09-01 LAB — RESPIRATORY CULTURE OR RESPIRATORY AND SPUTUM CULTURE: Gram Stain: NONE SEEN

## 2012-09-07 ENCOUNTER — Other Ambulatory Visit: Payer: Self-pay | Admitting: Emergency Medicine

## 2012-09-09 DIAGNOSIS — Z79899 Other long term (current) drug therapy: Secondary | ICD-10-CM | POA: Diagnosis not present

## 2012-09-09 DIAGNOSIS — G5690 Unspecified mononeuropathy of unspecified upper limb: Secondary | ICD-10-CM | POA: Diagnosis not present

## 2012-09-09 DIAGNOSIS — G894 Chronic pain syndrome: Secondary | ICD-10-CM | POA: Diagnosis not present

## 2012-09-09 DIAGNOSIS — M79609 Pain in unspecified limb: Secondary | ICD-10-CM | POA: Diagnosis not present

## 2012-09-14 ENCOUNTER — Other Ambulatory Visit: Payer: Self-pay | Admitting: Pain Medicine

## 2012-09-14 ENCOUNTER — Ambulatory Visit (INDEPENDENT_AMBULATORY_CARE_PROVIDER_SITE_OTHER): Payer: Medicare Other | Admitting: Emergency Medicine

## 2012-09-14 ENCOUNTER — Encounter: Payer: Self-pay | Admitting: Emergency Medicine

## 2012-09-14 VITALS — BP 128/80 | HR 101 | Temp 99.1°F | Resp 20

## 2012-09-14 DIAGNOSIS — R0602 Shortness of breath: Secondary | ICD-10-CM | POA: Diagnosis not present

## 2012-09-14 DIAGNOSIS — M25562 Pain in left knee: Secondary | ICD-10-CM

## 2012-09-14 DIAGNOSIS — M549 Dorsalgia, unspecified: Secondary | ICD-10-CM

## 2012-09-14 DIAGNOSIS — M542 Cervicalgia: Secondary | ICD-10-CM

## 2012-09-14 DIAGNOSIS — M25561 Pain in right knee: Secondary | ICD-10-CM

## 2012-09-14 DIAGNOSIS — I10 Essential (primary) hypertension: Secondary | ICD-10-CM | POA: Diagnosis not present

## 2012-09-14 DIAGNOSIS — E119 Type 2 diabetes mellitus without complications: Secondary | ICD-10-CM | POA: Diagnosis not present

## 2012-09-14 NOTE — Progress Notes (Signed)
  Subjective:    Patient ID: Tyrone Cunningham, male    DOB: 1955/05/25, 57 y.o.   MRN: [id number]  HPI patient here with recent worsening shortness of breath. He has been decreasing in frequency he uses his albuterol because he has been fearful he will run out. He is alert more albuterol from the mail-order but is waiting for them to send him his supply. He has not had any worsening productive cough. He denies chest pain. He states when he gets in the shower he becomes somewhat claustrophobic and has to get out of the shower .    Review of Systems     Objective:   Physical Exam patient looks about the same. His tracheostomy site is patent. Chest exam reveals rales present in the lower lobes with no true wheezes. Extremity exam reveals trace edema with stasis changes. He has decreased sensation on the bottoms of both feet consistent with his diagnosis of peripheral neuropathy.        Assessment & Plan:  I did reorder his albuterol and ordered his diabetic shoes. I gave him 5 samples of albuterol for his nebulizer machine. I do think some of his respiratory difficulty is related to his known congestive heart failure.

## 2012-09-20 ENCOUNTER — Other Ambulatory Visit: Payer: Self-pay | Admitting: Emergency Medicine

## 2012-09-21 ENCOUNTER — Other Ambulatory Visit: Payer: Self-pay | Admitting: Pain Medicine

## 2012-09-21 ENCOUNTER — Encounter: Payer: Self-pay | Admitting: Cardiology

## 2012-09-21 ENCOUNTER — Ambulatory Visit (INDEPENDENT_AMBULATORY_CARE_PROVIDER_SITE_OTHER): Payer: Medicare Other | Admitting: Cardiology

## 2012-09-21 VITALS — BP 118/70 | HR 109 | Ht 65.0 in | Wt 363.6 lb

## 2012-09-21 DIAGNOSIS — J449 Chronic obstructive pulmonary disease, unspecified: Secondary | ICD-10-CM

## 2012-09-21 DIAGNOSIS — I10 Essential (primary) hypertension: Secondary | ICD-10-CM

## 2012-09-21 DIAGNOSIS — M25561 Pain in right knee: Secondary | ICD-10-CM

## 2012-09-21 DIAGNOSIS — R0602 Shortness of breath: Secondary | ICD-10-CM

## 2012-09-21 DIAGNOSIS — R000 Tachycardia, unspecified: Secondary | ICD-10-CM

## 2012-09-21 DIAGNOSIS — M25562 Pain in left knee: Secondary | ICD-10-CM

## 2012-09-21 DIAGNOSIS — I498 Other specified cardiac arrhythmias: Secondary | ICD-10-CM | POA: Diagnosis not present

## 2012-09-21 DIAGNOSIS — M542 Cervicalgia: Secondary | ICD-10-CM

## 2012-09-21 DIAGNOSIS — J4489 Other specified chronic obstructive pulmonary disease: Secondary | ICD-10-CM

## 2012-09-21 DIAGNOSIS — M545 Low back pain: Secondary | ICD-10-CM

## 2012-09-21 NOTE — Patient Instructions (Addendum)
No Changes were made today  Please schedule an appointment with your pulmonologist Dr.Wright in the next 1-2 weeks  Follow up with Dr.Nishan as needed

## 2012-09-21 NOTE — Progress Notes (Signed)
CARDIOLOGY OFFICE NOTE  Patient ID: Tyrone Cunningham MRN: [id number], DOB/AGE: 59-Jul-1957   Date of Visit: 09/21/2012  Primary Physician: Lucilla Edin, MD Primary Cardiologist: Eden Emms, MD Reason for Visit: SOB  History of Present Illness  Tyrone Cunningham is a 57 y.o. male with Gold stage V COPD, OSA, HTN and DM who presents today for evaluation of SOB, as requested by his PCP. His COPD is followed regularly by Dr. Delford Field.   Tyrone Cunningham reports increasing SOB at rest x 2 days last week. His symptoms were constant for 2 days. He denies CP, palpitations, dizziness, near syncope or syncope. He denies nausea or diaphoresis. He has chronic LE swelling and denies worsening from baseline. He hasn't walked in over one year. He cannot lay flat. He tells me he was running out of his "breathing medicines" 2 weeks ago so he decided to cut his inhaler frequency in half, hoping to conserve his medication until he could afford the refill. About 4 days into this regimen, he noticed increasing SOB which lasted 2 days in total. He states he started using his inhalers again, as directed by Dr. Delford Field, and has noticed the acute / worsened SOB has resolved. He has chronic SOB and states he is now back to baseline since restarting his pulmonary medications.  Past Medical History Past Medical History  Diagnosis Date  . Bronchitis   . CHF (congestive heart failure)   . DM type 2 with diabetic peripheral neuropathy   . Hypertension   . Hypercholesteremia   . Hearing loss   . Peripheral neuropathy   . Sleep apnea     Severe sleep apnea requiring tracheostomy  . COPD (chronic obstructive pulmonary disease)     Past Surgical History Past Surgical History  Procedure Laterality Date  . Tracheostomy    . Appendectomy    . Hand surgery    . Left femor surgery    . Multiple extractions with alveoloplasty N/A 05/11/2012    Procedure: MULTIPLE EXTRACTION WITH ALVEOLOPLASTY;  Surgeon: [name], DDS;  Location: [facility] OR;   Service: Oral Surgery;  Laterality: N/A;    Allergies/Intolerances No Known Allergies  Current Home Medications Current Outpatient Prescriptions  Medication Sig Dispense Refill  . albuterol (PROVENTIL) (2.5 MG/3ML) 0.083% nebulizer solution Take 3 mLs (2.5 mg total) by nebulization 4 (four) times daily.  270 mL  6  . atorvastatin (LIPITOR) 10 MG tablet TAKE 1 TABLET BY MOUTH ONCE DAILY  90 tablet  0  . Fluticasone-Salmeterol (ADVAIR) 500-50 MCG/DOSE AEPB Inhale 1 puff into the lungs every 12 (twelve) hours.      . furosemide (LASIX) 40 MG tablet TAKE 1 TABLET BY MOUTH TWICE DAILY  60 tablet  3  . gabapentin (NEURONTIN) 300 MG capsule Take 300-600 mg by mouth 3 (three) times daily. Take 2 capsules in the morning 1 capsule at lunch and 2 capsules in the evening      . insulin regular (NOVOLIN R,HUMULIN R) 100 units/mL injection Inject 21-24 Units into the skin 4 (four) times daily - after meals and at bedtime.      . Insulin Syringe-Needle U-100 (B-D INS SYR ULTRAFINE 1CC/31G) 31G X 5/16" 1 ML MISC Use as Directed  100 each  1  . LANTUS 100 UNIT/ML injection INJECT 90 UNITS SUBCUTANEOUSLY EVERY DAY  30 mL  0  . lisinopril (PRINIVIL,ZESTRIL) 5 MG tablet TAKE 1 TABLET BY MOUTH EVERY DAY  90 tablet  0  . oxyCODONE-acetaminophen (PERCOCET) 10-325 MG per  tablet Take 1 tablet by mouth every 6 (six) hours as needed for pain.      . polyethylene glycol powder (GLYCOLAX/MIRALAX) powder DRINK 1 CAPFUL OF POWDER MIXED IN 8 OUNCES OF WATER EVERY DAY  255 g  5  . pregabalin (LYRICA) 75 MG capsule Take 75 mg by mouth at bedtime.      Marland Kitchen PRESCRIPTION MEDICATION Gralise 600mg  1 tablet 3 times a day      . PROAIR HFA 108 (90 BASE) MCG/ACT inhaler INHALE 2 PUFFS BY MOUTH EVERY 4-6 HOURS AS NEEDED  8.5 g  4  . SPIRIVA HANDIHALER 18 MCG inhalation capsule PLACE 1 CAPSULE INTO INHALER AND INHALE DAILY  30 capsule  4  . spironolactone (ALDACTONE) 25 MG tablet TAKE 1 TABLET BY MOUTH EVERY DAY  90 tablet  1  . traMADol  (ULTRAM) 50 MG tablet Take 100 mg by mouth every 6 (six) hours as needed for pain.      Marland Kitchen VOLTAREN 1 % GEL as needed.       No current facility-administered medications for this visit.   Social History History   Social History  . Marital Status: Single    Spouse Name: N/A    Number of Children: N/A  . Years of Education: N/A   Occupational History  . Not on file.   Social History Main Topics  . Smoking status: Former Smoker -- 2.00 packs/day for 30 years    Types: Cigarettes    Quit date: 07/02/1999  . Smokeless tobacco: Never Used  . Alcohol Use: No  . Drug Use: No  . Sexually Active: No   Other Topics Concern  . Not on file   Social History Narrative  . No narrative on file    Review of Systems General: No chills, fever, night sweats or weight changes Cardiovascular: No chest pain, dyspnea on exertion, edema, orthopnea, palpitations, paroxysmal nocturnal dyspnea Dermatological: No rash, lesions or masses Respiratory: No cough, dyspnea Urologic: No hematuria, dysuria Abdominal: No nausea, vomiting, diarrhea, bright red blood per rectum, melena, or hematemesis Neurologic: No visual changes, weakness, changes in mental status All other systems reviewed and are otherwise negative except as noted above.  Physical Exam Vitals: Blood pressure 118/70, pulse 109, height 5\' 5"  (1.651 m), weight 363 lb 9.6 oz (164.928 kg), SpO2 90.00%.  General: Well developed, chronically ill appearing 57 y.o. male in no acute distress. HEENT: Normocephalic, atraumatic. EOMs intact. Sclera nonicteric. Oropharynx clear.  Neck: Supple. No JVD. Trach in place. Lungs: Significantly diminished breath sounds bilaterally. Respirations regular and unlabored. No wheezes, rales or rhonchi. Heart: Distant heart tones. Regular S1, S2 without murmurs, rub, S3 or S4. Abdomen: Large, rotund. Cannot lay flat for full abdominal exam.  Extremities: No clubbing or cyanosis. 1+ non-pitting LE edema  bilaterally. Psych: Normal affect. Neuro: Alert and oriented X 3. Moves all extremities spontaneously. Skin: Multiple excoriations on forearms bilaterally.    Diagnostics 12-lead ECG today shows sinus tachycardia at 110 bpm with incomplete RBBB; no ST-T wave abnormalities; PR 162, QRS 96, QT/QTc 322/435  Assessment and Plan 1. SOB x 2 days, resolved SOB in setting of noncompliance with pulmonary medications / inhalers with underlying Gold stage V COPD SOB has resolved with restarting his pulmonary medications 2. HTN Normotensive today Continue current regimen Followed by PCP 3. Sinus tachycardia Likely chronic in setting of severe lung disease 4. Gold stage V COPD Counseled regarding the importance of medication compliance Needs follow-up with Dr. Delford Field in next 1-2  weeks  Signed, Rick Duff, PA-C 09/21/2012, 1:04 PM

## 2012-09-22 ENCOUNTER — Other Ambulatory Visit: Payer: Medicare Other

## 2012-09-23 ENCOUNTER — Other Ambulatory Visit: Payer: Medicare Other

## 2012-09-23 DIAGNOSIS — H7200 Central perforation of tympanic membrane, unspecified ear: Secondary | ICD-10-CM | POA: Diagnosis not present

## 2012-09-23 DIAGNOSIS — J42 Unspecified chronic bronchitis: Secondary | ICD-10-CM | POA: Diagnosis not present

## 2012-09-23 DIAGNOSIS — Z930 Tracheostomy status: Secondary | ICD-10-CM | POA: Diagnosis not present

## 2012-09-24 ENCOUNTER — Ambulatory Visit
Admission: RE | Admit: 2012-09-24 | Discharge: 2012-09-24 | Disposition: A | Discharge status: Home / Self Care | Payer: Medicare Other | Source: Ambulatory Visit | Attending: Pain Medicine | Admitting: Pain Medicine

## 2012-09-24 ENCOUNTER — Other Ambulatory Visit: Payer: Self-pay | Admitting: Pain Medicine

## 2012-09-24 DIAGNOSIS — M542 Cervicalgia: Secondary | ICD-10-CM

## 2012-09-24 DIAGNOSIS — M25561 Pain in right knee: Secondary | ICD-10-CM

## 2012-09-24 DIAGNOSIS — M1710 Unilateral primary osteoarthritis, unspecified knee: Secondary | ICD-10-CM | POA: Diagnosis not present

## 2012-09-24 DIAGNOSIS — M545 Low back pain, unspecified: Secondary | ICD-10-CM

## 2012-09-24 DIAGNOSIS — M25562 Pain in left knee: Secondary | ICD-10-CM

## 2012-09-24 DIAGNOSIS — M47817 Spondylosis without myelopathy or radiculopathy, lumbosacral region: Secondary | ICD-10-CM | POA: Diagnosis not present

## 2012-09-24 DIAGNOSIS — M21869 Other specified acquired deformities of unspecified lower leg: Secondary | ICD-10-CM | POA: Diagnosis not present

## 2012-09-24 DIAGNOSIS — M47812 Spondylosis without myelopathy or radiculopathy, cervical region: Secondary | ICD-10-CM | POA: Diagnosis not present

## 2012-09-30 ENCOUNTER — Telehealth: Payer: Self-pay | Admitting: Emergency Medicine

## 2012-09-30 NOTE — Telephone Encounter (Signed)
This patient suffers from diabetic neuropathy. He has callus formation over the bottoms of both feet and has suffered from secondary cellulitis related to these. He is in need of diabetic shoes

## 2012-10-01 ENCOUNTER — Other Ambulatory Visit: Payer: Self-pay | Admitting: Physician Assistant

## 2012-10-07 DIAGNOSIS — M5137 Other intervertebral disc degeneration, lumbosacral region: Secondary | ICD-10-CM | POA: Diagnosis not present

## 2012-10-07 DIAGNOSIS — Z79899 Other long term (current) drug therapy: Secondary | ICD-10-CM | POA: Diagnosis not present

## 2012-10-07 DIAGNOSIS — G894 Chronic pain syndrome: Secondary | ICD-10-CM | POA: Diagnosis not present

## 2012-10-07 DIAGNOSIS — M5030 Other cervical disc degeneration, unspecified cervical region: Secondary | ICD-10-CM | POA: Diagnosis not present

## 2012-10-21 ENCOUNTER — Other Ambulatory Visit: Payer: Self-pay | Admitting: Emergency Medicine

## 2012-10-22 ENCOUNTER — Other Ambulatory Visit: Payer: Self-pay | Admitting: Physician Assistant

## 2012-10-29 ENCOUNTER — Ambulatory Visit (INDEPENDENT_AMBULATORY_CARE_PROVIDER_SITE_OTHER): Payer: Medicare Other | Admitting: Emergency Medicine

## 2012-10-29 VITALS — BP 142/90 | HR 100 | Temp 98.9°F | Resp 20

## 2012-10-29 DIAGNOSIS — E119 Type 2 diabetes mellitus without complications: Secondary | ICD-10-CM | POA: Diagnosis not present

## 2012-10-29 DIAGNOSIS — L02419 Cutaneous abscess of limb, unspecified: Secondary | ICD-10-CM | POA: Diagnosis not present

## 2012-10-29 DIAGNOSIS — L03115 Cellulitis of right lower limb: Secondary | ICD-10-CM

## 2012-10-29 LAB — GLUCOSE, POCT (MANUAL RESULT ENTRY): POC Glucose: 245 mg/dl — AB (ref 70–99)

## 2012-10-29 MED ORDER — MUPIROCIN 2 % EX OINT: TOPICAL_OINTMENT | Freq: Three times a day (TID) | CUTANEOUS | Status: DC

## 2012-10-29 MED ORDER — CLINDAMYCIN HCL 300 MG PO CAPS: 300.0000 mg | ORAL_CAPSULE | Freq: Three times a day (TID) | ORAL | Status: DC

## 2012-10-29 NOTE — Progress Notes (Signed)
  Subjective:    Patient ID: Tyrone Cunningham, male    DOB: 04-29-1955, 57 y.o.   MRN: [id number]  HPI Pt here complains of right leg sores, with some drainage,redness and tenderness. Cleaned with peroxide and antibiotic with no relief. X 1.5 weeks ago   Review of Systems     Objective:   Physical Exam there are open sores over the right shin which is draining is a serious somewhat purulent fluid. There signs of chronic stasis changes. The foot itself looks normal pulses are present. He does have a neuropathy involving the right foot   Results for orders placed in visit on 10/29/12  GLUCOSE, POCT (MANUAL RESULT ENTRY)      Result Value Range   POC Glucose 245 (*) 70 - 99 mg/dl  POCT GLYCOSYLATED HEMOGLOBIN (HGB A1C)      Result Value Range   Hemoglobin A1C 11.5          Assessment & Plan:  Wound culture was done we'll treat with Cleocin and Bactroban hemoglobin A1c and sugar were done today. He admits that he has not been eating right. He has not been taking his insulin as instructed his weight is up and his hemoglobin A1c is point

## 2012-11-01 LAB — WOUND CULTURE: Gram Stain: NONE SEEN

## 2012-11-04 DIAGNOSIS — M5030 Other cervical disc degeneration, unspecified cervical region: Secondary | ICD-10-CM | POA: Diagnosis not present

## 2012-11-04 DIAGNOSIS — M531 Cervicobrachial syndrome: Secondary | ICD-10-CM | POA: Diagnosis not present

## 2012-11-04 DIAGNOSIS — Z79899 Other long term (current) drug therapy: Secondary | ICD-10-CM | POA: Diagnosis not present

## 2012-11-04 DIAGNOSIS — G894 Chronic pain syndrome: Secondary | ICD-10-CM | POA: Diagnosis not present

## 2012-11-05 ENCOUNTER — Ambulatory Visit (INDEPENDENT_AMBULATORY_CARE_PROVIDER_SITE_OTHER): Payer: Medicare Other | Admitting: Emergency Medicine

## 2012-11-05 VITALS — BP 142/90 | HR 102 | Temp 99.4°F | Resp 20

## 2012-11-05 DIAGNOSIS — E119 Type 2 diabetes mellitus without complications: Secondary | ICD-10-CM | POA: Diagnosis not present

## 2012-11-05 DIAGNOSIS — L0291 Cutaneous abscess, unspecified: Secondary | ICD-10-CM

## 2012-11-05 DIAGNOSIS — B9562 Methicillin resistant Staphylococcus aureus infection as the cause of diseases classified elsewhere: Secondary | ICD-10-CM

## 2012-11-05 NOTE — Patient Instructions (Addendum)
MRSA Overview  MRSA stands for methicillin-resistant Staphylococcus aureus. It is a type of bacteria that is resistant to some common antibiotics. It can cause infections in the skin and many other places in the body. Staphylococcus aureus, often called "staph," is a bacteria that normally lives on the skin or in the nose. Staph on the surface of the skin or in the nose does not cause problems. However, if the staph enters the body through a cut, wound, or break in the skin, an infection can happen.  Up until recently, infections with the MRSA type of staph mainly occurred in hospitals and other healthcare settings. There are now increasing problems with MRSA infections in the community as well. Infections with MRSA may be very serious or even life-threatening. Most MRSA infections are acquired in one of two ways:  · Healthcare-associated MRSA (HA-MRSA)  · This can be acquired by people in any healthcare setting. MRSA can be a big problem for hospitalized people, people in nursing homes, people in rehabilitation facilities, people with weakened immune systems, dialysis patients, and those who have had surgery.  · Community-associated MRSA (CA-MRSA)  · Community spread of MRSA is becoming more common. It is known to spread in crowded settings, in jails and prisons, and in situations where there is close skin-to-skin contact, such as during sporting events or in locker rooms. MRSA can be spread through shared items, such as children's toys, razors, towels, or sports equipment.  CAUSES   All staph, including MRSA, are normally harmless unless they enter the body through a scratch, cut, or wound, such as with surgery. All staph, including MRSA, can be spread from person-to-person by touching contaminated objects or through direct contact.  SPECIAL GROUPS  MRSA can present problems for special groups of people. Some of these groups include:  · Breastfeeding women.  · The most common problem is MRSA infection of the  breast (mastitis). There is evidence that MRSA can be passed to an infant from infected breast milk. Your caregiver may recommend that you stop breastfeeding until the mastitis is under control.  · If you are breastfeeding and have a MRSA infection in a place other than the breast, you may usually continue breastfeeding while under treatment. If taking antibiotics, ask your caregiver if it is safe to continue breastfeeding while taking your prescribed medicines.  · Neonates (babies from birth to 1 month old) and infants (babies from 1 month to 1 year old).  · There is evidence that MRSA can be passed to a newborn at birth if the mother has MRSA on the skin, in or around the birth canal, or an infection in the uterus, cervix, or vagina. MRSA infection can have the same appearance as a normal newborn or infant rash or several other skin infections. This can make it hard to diagnose MRSA.  · Immune compromised people.  · If you have an immune system problem, you may have a higher chance of developing a MRSA infection.  · People after any type of surgery.  · Staph in general, including MRSA, is the most common cause of infections occurring at the site of recent surgery.  · People on long-term steroid medicines.  · These kinds of medicines can lower your resistance to infection. This can increase your chance of getting MRSA.  · People who have had frequent hospitalizations, live in nursing homes or other residential care facilities, have venous or urinary catheters, or have taken multiple courses of antibiotic therapy for any reason.    DIAGNOSIS   Diagnosis of MRSA is done by cultures of fluid samples that may come from:  · Swabs taken from cuts or wounds in infected areas.  · Nasal swabs.  · Saliva or deep cough specimens from the lungs (sputum).  · Urine.  · Blood.  Many people are "colonized" with MRSA but have no signs of infection. This means that people carry the MRSA germ on their skin or in their nose and may  never develop MRSA infection.   TREATMENT   Treatment varies and is based on how serious, how deep, or how extensive the infection is. For example:  · Some skin infections, such as a small boil or abscess, may be treated by draining yellowish-white fluid (pus) from the site of the infection.  · Deeper or more widespread soft tissue infections are usually treated with surgery to drain pus and with antibiotic medicine given by vein or by mouth. This may be recommended even if you are pregnant.  · Serious infections may require a hospital stay.  If antibiotics are given, they may be needed for several weeks.  PREVENTION   Because many people are colonized with staph, including MRSA, preventing the spread of the bacteria from person-to-person is most important. The best way to prevent the spread of bacteria and other germs is through proper hand washing or by using alcohol-based hand disinfectants. The following are other ways to help prevent MRSA infection within the hospital and community settings.   · Healthcare settings:  · Strict hand washing or hand disinfection procedures need to be followed before and after touching every patient.  · Patients infected with MRSA are placed in isolation to prevent the spread of the bacteria.  · Healthcare workers need to wear disposable gowns and gloves when touching or caring for patients infected with MRSA. Visitors may also be asked to wear a gown and gloves.  · Hospital surfaces need to be disinfected frequently.  · Community settings:  · Wash your hands frequently with soap and water for at least 15 seconds. Otherwise, use alcohol-based hand disinfectants when soap and water is not available.  · Make sure people who live with you wash their hands often, too.  · Do not share personal items. For example, avoid sharing razors and other personal hygiene items, towels, clothing, and athletic equipment.  · Wash and dry your clothes and bedding at the warmest temperatures  recommended on the labels.  · Keep wounds covered. Pus from infected sores may contain MRSA and other bacteria. Keep cuts and abrasions clean and covered with germ-free (sterile), dry bandages until they are healed.  · If you have a wound that appears infected, ask your caregiver if a culture for MRSA and other bacteria should be done.  · If you are breastfeeding, talk to your caregiver about MRSA. You may be asked to temporarily stop breastfeeding.  HOME CARE INSTRUCTIONS   · Take your antibiotics as directed. Finish them even if you start to feel better.  · Avoid close contact with those around you as much as possible. Do not use towels, razors, toothbrushes, bedding, or other items that will be used by others.  · To fight the infection, follow your caregiver's instructions for wound care. Wash your hands before and after changing your bandages.  · If you have an intravascular device, such as a catheter, make sure you know how to care for it.  · Be sure to tell any healthcare providers that you have MRSA   so they are aware of your infection.  SEEK IMMEDIATE MEDICAL CARE IF:   · The infection appears to be getting worse. Signs include:  · Increased warmth, redness, or tenderness around the wound site.  · A red line that extends from the infection site.  · A dark color in the area around the infection.  · Wound drainage that is tan, yellow, or green.  · A bad smell coming from the wound.  · You feel sick to your stomach (nauseous) and throw up (vomit) or cannot keep medicine down.  · You have a fever.  · Your baby is older than 3 months with a rectal temperature of 102° F (38.9° C) or higher.  · Your baby is 3 months old or younger with a rectal temperature of 100.4° F (38° C) or higher.  · You have difficulty breathing.  MAKE SURE YOU:   · Understand these instructions.  · Will watch your condition.  · Will get help right away if you are not doing well or get worse.  Document Released: 02/17/2005 Document Revised:  05/12/2011 Document Reviewed: 05/22/2010  [employer]® Patient Information ©2014 [employer].

## 2012-11-05 NOTE — Progress Notes (Signed)
  Subjective:    Patient ID: Tyrone Cunningham, male    DOB: 22-Dec-1955, 57 y.o.   MRN: [id number]  HPI patient here to followup on lesions on his right shin. He has had decreased drainage and decreased redness around the drainage sites. His culture grew MRSA and he was switched from Cleocin to doxycycline. He continues using his new computers and ointment    Review of Systems     Objective:   Physical Exam patient has significant stasis changes bilaterally. There are 2 ulcers each measuring 2 x 2 centimeters over the right mid shin. Pulses are intact        Assessment & Plan:  Patient is a MRSA cellulitis on top of chronic venous stasis changes and diabetes. He will continue his doxycycline and mucopiricin

## 2012-11-11 ENCOUNTER — Ambulatory Visit
Admission: RE | Admit: 2012-11-11 | Discharge: 2012-11-11 | Disposition: A | Discharge status: Home / Self Care | Payer: Medicare Other | Source: Ambulatory Visit | Attending: Pain Medicine | Admitting: Pain Medicine

## 2012-11-11 ENCOUNTER — Other Ambulatory Visit: Payer: Self-pay | Admitting: Pain Medicine

## 2012-11-11 DIAGNOSIS — M25551 Pain in right hip: Secondary | ICD-10-CM

## 2012-11-11 DIAGNOSIS — M169 Osteoarthritis of hip, unspecified: Secondary | ICD-10-CM | POA: Diagnosis not present

## 2012-11-12 ENCOUNTER — Other Ambulatory Visit: Payer: Self-pay | Admitting: Emergency Medicine

## 2012-11-19 ENCOUNTER — Ambulatory Visit (INDEPENDENT_AMBULATORY_CARE_PROVIDER_SITE_OTHER): Payer: Medicare Other | Admitting: Emergency Medicine

## 2012-11-19 VITALS — BP 138/72 | HR 104 | Temp 99.3°F | Resp 20

## 2012-11-19 DIAGNOSIS — Z23 Encounter for immunization: Secondary | ICD-10-CM | POA: Diagnosis not present

## 2012-11-19 DIAGNOSIS — L0291 Cutaneous abscess, unspecified: Secondary | ICD-10-CM

## 2012-11-19 DIAGNOSIS — L0390 Cellulitis, unspecified: Secondary | ICD-10-CM

## 2012-11-19 MED ORDER — DOXYCYCLINE HYCLATE 100 MG PO CAPS: 100.0000 mg | ORAL_CAPSULE | Freq: Two times a day (BID) | ORAL | Status: DC

## 2012-11-19 NOTE — Progress Notes (Signed)
  Subjective:    Patient ID: Tyrone Cunningham, male    DOB: [birth year], 57 y.o.   MRN: [id number]  HPI Pt here for a follow up of the right leg, still has not improved.    Review of Systems     Objective:   Physical Exam patient has significant stasis changes on his leg. There are 2 ulcerations present on the right shin and medial ulcer measures 2 and half by 2 and half centimeters the more lateral ulcer measures 4 x 2 cm. The tissue appears to be healing from the borders.        Assessment & Plan:  Patient here with culture proven saline this with ulceration of the right shin. Patient has severe underlying stasis changes. He will continue on doxycycline twice a day for the next 2 weeks he will continue soap and water cleaning followed by Bactroban ointment.Marland Kitchen

## 2012-11-19 NOTE — Addendum Note (Signed)
Addended by: Braxton Feathers on: 11/19/2012 03:23 PM   Modules accepted: Orders

## 2012-11-20 ENCOUNTER — Other Ambulatory Visit: Payer: Self-pay | Admitting: Emergency Medicine

## 2012-11-22 ENCOUNTER — Other Ambulatory Visit: Payer: Self-pay | Admitting: Physician Assistant

## 2012-11-24 DIAGNOSIS — G5600 Carpal tunnel syndrome, unspecified upper limb: Secondary | ICD-10-CM | POA: Diagnosis not present

## 2012-11-27 ENCOUNTER — Other Ambulatory Visit: Payer: Self-pay | Admitting: Emergency Medicine

## 2012-11-29 ENCOUNTER — Other Ambulatory Visit: Payer: Self-pay | Admitting: Emergency Medicine

## 2012-12-02 ENCOUNTER — Other Ambulatory Visit: Payer: Self-pay | Admitting: Emergency Medicine

## 2012-12-02 DIAGNOSIS — M461 Sacroiliitis, not elsewhere classified: Secondary | ICD-10-CM | POA: Diagnosis not present

## 2012-12-02 DIAGNOSIS — Z79899 Other long term (current) drug therapy: Secondary | ICD-10-CM | POA: Diagnosis not present

## 2012-12-02 DIAGNOSIS — G894 Chronic pain syndrome: Secondary | ICD-10-CM | POA: Diagnosis not present

## 2012-12-02 DIAGNOSIS — G5600 Carpal tunnel syndrome, unspecified upper limb: Secondary | ICD-10-CM | POA: Diagnosis not present

## 2012-12-02 NOTE — Telephone Encounter (Signed)
Pt of Dr.Daub. Is having trouble getting a cream he was advised to use. Uses [facility] at [address]. Call at friends house at [phone].

## 2012-12-03 ENCOUNTER — Other Ambulatory Visit: Payer: Self-pay | Admitting: Emergency Medicine

## 2012-12-03 ENCOUNTER — Ambulatory Visit: Payer: Medicare Other

## 2012-12-03 DIAGNOSIS — L0390 Cellulitis, unspecified: Secondary | ICD-10-CM

## 2012-12-03 MED ORDER — DOXYCYCLINE HYCLATE 100 MG PO CAPS: 100.0000 mg | ORAL_CAPSULE | Freq: Two times a day (BID) | ORAL | Status: DC

## 2012-12-03 MED ORDER — MUPIROCIN 2 % EX OINT: TOPICAL_OINTMENT | CUTANEOUS | Status: DC

## 2012-12-10 ENCOUNTER — Other Ambulatory Visit: Payer: Self-pay | Admitting: Physician Assistant

## 2012-12-14 ENCOUNTER — Ambulatory Visit (INDEPENDENT_AMBULATORY_CARE_PROVIDER_SITE_OTHER): Payer: Medicare Other | Admitting: Emergency Medicine

## 2012-12-14 ENCOUNTER — Encounter: Payer: Self-pay | Admitting: Emergency Medicine

## 2012-12-14 VITALS — BP 127/70 | HR 107 | Temp 99.6°F | Resp 24

## 2012-12-14 DIAGNOSIS — G4733 Obstructive sleep apnea (adult) (pediatric): Secondary | ICD-10-CM | POA: Diagnosis not present

## 2012-12-14 DIAGNOSIS — L0291 Cutaneous abscess, unspecified: Secondary | ICD-10-CM | POA: Diagnosis not present

## 2012-12-14 DIAGNOSIS — E119 Type 2 diabetes mellitus without complications: Secondary | ICD-10-CM

## 2012-12-14 DIAGNOSIS — M79609 Pain in unspecified limb: Secondary | ICD-10-CM

## 2012-12-14 DIAGNOSIS — M79661 Pain in right lower leg: Secondary | ICD-10-CM

## 2012-12-14 LAB — POCT GLYCOSYLATED HEMOGLOBIN (HGB A1C): Hemoglobin A1C: 10.5

## 2012-12-14 NOTE — Progress Notes (Signed)
  Subjective:    Patient ID: Tyrone Cunningham, male    DOB: 1955-10-04, 57 y.o.   MRN: [id number]  HPI patient here to followup obstructive sleep apnea status post placement of a tracheostomy tube. He has been under treatment for his severe cellulitis of his right leg open areas over the right shin. This recently grew MRSA the 2 days ago he had a 1-2 minute episode of severe left pleuritic chest pain but no recurrences since that time. He does have mild tenderness in both calves appear    Review of Systems     Objective:   Physical Exam patient is alert and cooperative he is in no distress. His neck is supple. There is a tracheostomy tube. Chest revealed rhonchi in the bases cardiac revealed a regular rate and rhythm. Extremity exam revealed dependent rubor of the toes. He has severe stasis changes which are worse on the right than the left. He has bilateral lower calf tenderness        Results for orders placed in visit on 12/14/12  GLUCOSE, POCT (MANUAL RESULT ENTRY)      Result Value Range   POC Glucose 359 (*) 70 - 99 mg/dl  POCT GLYCOSYLATED HEMOGLOBIN (HGB A1C)      Result Value Range   Hemoglobin A1C 10.5     Assessment & Plan:  Hemoglobin A1c is down a point which is better. It is still significantly out of control. We'll go ahead and check venous Dopplers of both legs because of his swelling calf tenderness and episode of left-sided chest pain

## 2012-12-17 ENCOUNTER — Other Ambulatory Visit: Payer: Self-pay | Admitting: Emergency Medicine

## 2012-12-17 NOTE — Telephone Encounter (Signed)
Dr Cleta Alberts, last month I put note on RF that pt needed OV/Labs for more RFs. He has been in since then, but chol wasn't discussed and hasn't had lipids checked for a while. Do you want to give pt RFs or have him RTC?

## 2012-12-29 DIAGNOSIS — G894 Chronic pain syndrome: Secondary | ICD-10-CM | POA: Diagnosis not present

## 2012-12-29 DIAGNOSIS — M1710 Unilateral primary osteoarthritis, unspecified knee: Secondary | ICD-10-CM | POA: Diagnosis not present

## 2012-12-29 DIAGNOSIS — M5137 Other intervertebral disc degeneration, lumbosacral region: Secondary | ICD-10-CM | POA: Diagnosis not present

## 2012-12-29 DIAGNOSIS — M461 Sacroiliitis, not elsewhere classified: Secondary | ICD-10-CM | POA: Diagnosis not present

## 2012-12-29 DIAGNOSIS — Z79899 Other long term (current) drug therapy: Secondary | ICD-10-CM | POA: Diagnosis not present

## 2013-01-06 ENCOUNTER — Encounter: Payer: Self-pay | Admitting: Critical Care Medicine

## 2013-01-06 ENCOUNTER — Ambulatory Visit (INDEPENDENT_AMBULATORY_CARE_PROVIDER_SITE_OTHER): Payer: Medicare Other | Admitting: Critical Care Medicine

## 2013-01-06 VITALS — BP 118/64 | HR 99 | Ht 65.0 in

## 2013-01-06 DIAGNOSIS — J449 Chronic obstructive pulmonary disease, unspecified: Secondary | ICD-10-CM | POA: Diagnosis not present

## 2013-01-06 DIAGNOSIS — J9610 Chronic respiratory failure, unspecified whether with hypoxia or hypercapnia: Secondary | ICD-10-CM | POA: Diagnosis not present

## 2013-01-06 NOTE — Progress Notes (Signed)
Subjective:    Patient ID: Tyrone Cunningham, male    DOB: 02/05/1956, 57 y.o.   MRN: [id number]  HPI  57 y.o. WM   Copd. Trach dependent.   Pt last seen PW 38yrs ago. Dr Cleta Alberts has been managing the pt.  MOre recently had to go to hosp one month ago for lung infection.   Pt exposed to passive smoke exposure.  Not much mucus out of trach.  Trach in place since 2001 for OSA.    No real chest pain.  Has not seen cardiology.  Pt notes some edema in legs.   Notes some dyspnea at rest.  Pt has oxygen at night.  [facility] is DME.    01/06/2013 Chief Complaint  Patient presents with  . 6 month follow up    Breathing is unchanged.  C/o increased daytime sleepiness.  Thinks he may need to start wearing his o2 qhs.  Does has occas SOB and coughing in the mornings with clear to slight green mucus.    Pt notes is sleepy daytime. Has not been using a concentrator lately due to the fact that the patient's concentrator is not functioning properly There been no other new issues recently Pt denies any significant sore throat, nasal congestion or excess secretions, fever, chills, sweats, unintended weight loss, pleurtic or exertional chest pain, orthopnea PND, or leg swelling Pt denies any increase in rescue therapy over baseline, denies waking up needing it or having any early am or nocturnal exacerbations of coughing/wheezing/or dyspnea. Pt also denies any obvious fluctuation in symptoms with  weather or environmental change or other alleviating or aggravating factors     Past Medical History  Diagnosis Date  . Bronchitis   . CHF (congestive heart failure)   . DM type 2 with diabetic peripheral neuropathy   . Hypertension   . Hypercholesteremia   . Hearing loss   . Peripheral neuropathy   . Sleep apnea     Severe sleep apnea requiring tracheostomy  . COPD (chronic obstructive pulmonary disease)      Family History  Problem Relation Age of Onset  . Coronary artery disease Father      History   Social  History  . Marital Status: Single    Spouse Name: N/A    Number of Children: N/A  . Years of Education: N/A   Occupational History  . Not on file.   Social History Main Topics  . Smoking status: Former Smoker -- 2.00 packs/day for 30 years    Types: Cigarettes    Quit date: 07/02/1999  . Smokeless tobacco: Never Used  . Alcohol Use: No  . Drug Use: No  . Sexual Activity: No   Other Topics Concern  . Not on file   Social History Narrative  . No narrative on file     No Known Allergies   Outpatient Prescriptions Prior to Visit  Medication Sig Dispense Refill  . ADVAIR DISKUS 500-50 MCG/DOSE AEPB INHALE 1 PUFF BY MOUTH TWICE DAILY  60 each  2  . albuterol (PROVENTIL) (2.5 MG/3ML) 0.083% nebulizer solution Take 3 mLs (2.5 mg total) by nebulization 4 (four) times daily.  270 mL  6  . atorvastatin (LIPITOR) 10 MG tablet TAKE 1 TABLET BY MOUTH DAILY.  30 tablet  0  . furosemide (LASIX) 40 MG tablet TAKE 1 TABLET BY MOUTH TWICE DAILY  180 tablet  0  . gabapentin (NEURONTIN) 300 MG capsule Take 300-600 mg by mouth 3 (three) times daily. 2-3  capsules daily      . insulin regular (NOVOLIN R,HUMULIN R) 100 units/mL injection Inject 21-24 Units into the skin 4 (four) times daily - after meals and at bedtime.      . Insulin Syringe-Needle U-100 (B-D INS SYR ULTRAFINE 1CC/31G) 31G X 5/16" 1 ML MISC Use as Directed  100 each  1  . LANTUS 100 UNIT/ML injection INJECT 90 UNITS UNDER THE SKIN EVERY DAY AS DIRECTED  30 mL  4  . lisinopril (PRINIVIL,ZESTRIL) 5 MG tablet TAKE 1 TABLET BY MOUTH EVERY DAY  90 tablet  0  . oxyCODONE-acetaminophen (PERCOCET) 10-325 MG per tablet Take 1 tablet by mouth every 6 (six) hours as needed for pain.      . polyethylene glycol powder (GLYCOLAX/MIRALAX) powder MIX 1 CAPFUL IN 8 OUNCES OF WATER AND DRINK EVERY DAY  255 g  3  . PROAIR HFA 108 (90 BASE) MCG/ACT inhaler INHALE 2 PUFFS BY MOUTH EVERY 4-6 HOURS AS NEEDED  8.5 g  4  . SPIRIVA HANDIHALER 18 MCG  inhalation capsule PLACE ONE CAPSULE INTO INHALER AND INHALE DAILY  30 capsule  2  . spironolactone (ALDACTONE) 25 MG tablet TAKE 1 TABLET BY MOUTH EVERY DAY  90 tablet  1  . traMADol (ULTRAM) 50 MG tablet Take 100 mg by mouth every 6 (six) hours as needed for pain.      Marland Kitchen VOLTAREN 1 % GEL as needed.      Marland Kitchen ADVAIR DISKUS 500-50 MCG/DOSE AEPB INHALE 1 PUFF BY MOUTH TWICE A DAY  60 each  1  . Fluticasone-Salmeterol (ADVAIR) 500-50 MCG/DOSE AEPB Inhale 1 puff into the lungs every 12 (twelve) hours.      . clindamycin (CLEOCIN) 300 MG capsule Take 1 capsule (300 mg total) by mouth 3 (three) times daily.  42 capsule  0  . doxycycline (VIBRAMYCIN) 100 MG capsule Take 1 capsule (100 mg total) by mouth 2 (two) times daily.  28 capsule  0  . mupirocin ointment (BACTROBAN) 2 % APPLY TOPICALLY THREE TIMES DAILY  22 g  2  . pregabalin (LYRICA) 75 MG capsule Take 75 mg by mouth at bedtime.      Marland Kitchen PRESCRIPTION MEDICATION Gralise 600mg  1 tablet 3 times a day       No facility-administered medications prior to visit.     Review of Systems  Constitutional: Positive for fatigue. Negative for fever, chills, diaphoresis, activity change, appetite change and unexpected weight change.  HENT: Positive for congestion, dental problem, rhinorrhea and sneezing. Negative for ear discharge, ear pain, facial swelling, hearing loss, mouth sores, nosebleeds, postnasal drip, sinus pressure, sore throat, tinnitus, trouble swallowing and voice change.   Eyes: Negative for photophobia, discharge, itching and visual disturbance.  Respiratory: Positive for cough and shortness of breath. Negative for apnea, choking, chest tightness, wheezing and stridor.   Cardiovascular: Positive for chest pain and palpitations. Negative for leg swelling.  Gastrointestinal: Negative for nausea, vomiting, abdominal pain, constipation, blood in stool and abdominal distention.  Genitourinary: Negative for dysuria, urgency, frequency, hematuria, flank  pain, decreased urine volume and difficulty urinating.  Musculoskeletal: Positive for back pain and gait problem. Negative for arthralgias, joint swelling, myalgias, neck pain and neck stiffness.  Skin: Negative for color change, pallor and rash.  Neurological: Negative for dizziness, tremors, seizures, syncope, speech difficulty, weakness, light-headedness, numbness and headaches.  Hematological: Negative for adenopathy. Does not bruise/bleed easily.  Psychiatric/Behavioral: Positive for sleep disturbance. Negative for confusion and agitation. The patient is not  nervous/anxious.        Objective:   Physical Exam  Filed Vitals:   01/06/13 1121  BP: 118/64  Pulse: 99  Height: 5\' 5"  (1.651 m)  SpO2: 90%    Gen: obese , well-nourished, in no distress,  normal affect,metal  trach in place  ENT: No lesions,  mouth clear,  oropharynx clear, no postnasal drip  Neck: No JVD, no TMG, no carotid bruits  Lungs: No use of accessory muscles, no dullness to percussion, distant BS  Cardiovascular: RRR, heart sounds normal, no murmur or gallops, 2+  peripheral edema  Abdomen: soft and NT, no HSM,  BS normal  Musculoskeletal: No deformities, no cyanosis or clubbing  Neuro: alert, non focal  Skin: Warm, no lesions or rashes  No results found.     Assessment & Plan:   [name] with frequent exacerbations Gold stage D. COPD with frequent exacerbations now improved Plan Maintain inhaled medications as prescribed Ensure oxygen therapy is delivered at night  Chronic respiratory failure Chronic respiratory failure and malfunctioning oxygen concentrator We'll obtain oxygen concentrator that has improved functionality    Updated Medication List Outpatient Encounter Prescriptions as of 01/06/2013  Medication Sig  . ADVAIR DISKUS 500-50 MCG/DOSE AEPB INHALE 1 PUFF BY MOUTH TWICE DAILY  . albuterol (PROVENTIL) (2.5 MG/3ML) 0.083% nebulizer solution Take 3 mLs (2.5 mg total) by  nebulization 4 (four) times daily.  Marland Kitchen atorvastatin (LIPITOR) 10 MG tablet TAKE 1 TABLET BY MOUTH DAILY.  . furosemide (LASIX) 40 MG tablet TAKE 1 TABLET BY MOUTH TWICE DAILY  . gabapentin (NEURONTIN) 300 MG capsule Take 300-600 mg by mouth 3 (three) times daily. 2-3 capsules daily  . GRALISE 600 MG TABS Take 3 tablets by mouth every morning.  . insulin regular (NOVOLIN R,HUMULIN R) 100 units/mL injection Inject 21-24 Units into the skin 4 (four) times daily - after meals and at bedtime.  . Insulin Syringe-Needle U-100 (B-D INS SYR ULTRAFINE 1CC/31G) 31G X 5/16" 1 ML MISC Use as Directed  . LANTUS 100 UNIT/ML injection INJECT 90 UNITS UNDER THE SKIN EVERY DAY AS DIRECTED  . lisinopril (PRINIVIL,ZESTRIL) 5 MG tablet TAKE 1 TABLET BY MOUTH EVERY DAY  . oxyCODONE-acetaminophen (PERCOCET) 10-325 MG per tablet Take 1 tablet by mouth every 6 (six) hours as needed for pain.  . polyethylene glycol powder (GLYCOLAX/MIRALAX) powder MIX 1 CAPFUL IN 8 OUNCES OF WATER AND DRINK EVERY DAY  . PROAIR HFA 108 (90 BASE) MCG/ACT inhaler INHALE 2 PUFFS BY MOUTH EVERY 4-6 HOURS AS NEEDED  . SPIRIVA HANDIHALER 18 MCG inhalation capsule PLACE ONE CAPSULE INTO INHALER AND INHALE DAILY  . spironolactone (ALDACTONE) 25 MG tablet TAKE 1 TABLET BY MOUTH EVERY DAY  . traMADol (ULTRAM) 50 MG tablet Take 100 mg by mouth every 6 (six) hours as needed for pain.  Marland Kitchen VOLTAREN 1 % GEL as needed.  . [DISCONTINUED] ADVAIR DISKUS 500-50 MCG/DOSE AEPB INHALE 1 PUFF BY MOUTH TWICE A DAY  . [DISCONTINUED] Fluticasone-Salmeterol (ADVAIR) 500-50 MCG/DOSE AEPB Inhale 1 puff into the lungs every 12 (twelve) hours.  . [DISCONTINUED] clindamycin (CLEOCIN) 300 MG capsule Take 1 capsule (300 mg total) by mouth 3 (three) times daily.  . [DISCONTINUED] doxycycline (VIBRAMYCIN) 100 MG capsule Take 1 capsule (100 mg total) by mouth 2 (two) times daily.  . [DISCONTINUED] mupirocin ointment (BACTROBAN) 2 % APPLY TOPICALLY THREE TIMES DAILY  .  [DISCONTINUED] pregabalin (LYRICA) 75 MG capsule Take 75 mg by mouth at bedtime.  . [DISCONTINUED] PRESCRIPTION  MEDICATION Gralise 600mg  1 tablet 3 times a day

## 2013-01-06 NOTE — Patient Instructions (Signed)
No changes in medications  Return 6 months

## 2013-01-07 NOTE — Assessment & Plan Note (Signed)
Gold stage D. COPD with frequent exacerbations now improved Plan Maintain inhaled medications as prescribed Ensure oxygen therapy is delivered at night

## 2013-01-07 NOTE — Assessment & Plan Note (Signed)
Chronic respiratory failure and malfunctioning oxygen concentrator We'll obtain oxygen concentrator that has improved functionality

## 2013-01-11 ENCOUNTER — Telehealth: Payer: Self-pay | Admitting: Critical Care Medicine

## 2013-01-11 NOTE — Telephone Encounter (Signed)
Spoke with Melissa and she states that she is going to call the Tyrone Cunningham to see what the issues are. They show that they delivered everything the Tyrone Cunningham should need. She states they will speak with him and figure out what is needed and fix it. Carron Curie, CMA

## 2013-01-11 NOTE — Telephone Encounter (Signed)
I spoke with pt. He reports he is having problems with [facility]. He was set up with O2 concentrator but equipment if for nasal. Pt has trach. I called Melissa and she is going to check on this.

## 2013-01-12 ENCOUNTER — Other Ambulatory Visit: Payer: Self-pay | Admitting: Emergency Medicine

## 2013-01-14 ENCOUNTER — Other Ambulatory Visit: Payer: Self-pay | Admitting: Emergency Medicine

## 2013-01-14 NOTE — Telephone Encounter (Signed)
Dr Cleta Alberts, you have recently seen pt in Oct and discussed DM but not any mention of HTN eval. Is it OK to RF pt's Rx for spirolactone for 6 mos or does pt need to RTC? I have pended Rx for your review.

## 2013-01-18 DIAGNOSIS — Z930 Tracheostomy status: Secondary | ICD-10-CM | POA: Diagnosis not present

## 2013-01-18 DIAGNOSIS — G4730 Sleep apnea, unspecified: Secondary | ICD-10-CM | POA: Diagnosis not present

## 2013-01-20 NOTE — Telephone Encounter (Signed)
error 

## 2013-01-26 DIAGNOSIS — IMO0001 Reserved for inherently not codable concepts without codable children: Secondary | ICD-10-CM | POA: Diagnosis not present

## 2013-01-26 DIAGNOSIS — M1710 Unilateral primary osteoarthritis, unspecified knee: Secondary | ICD-10-CM | POA: Diagnosis not present

## 2013-01-26 DIAGNOSIS — Z79899 Other long term (current) drug therapy: Secondary | ICD-10-CM | POA: Diagnosis not present

## 2013-01-26 DIAGNOSIS — G894 Chronic pain syndrome: Secondary | ICD-10-CM | POA: Diagnosis not present

## 2013-01-28 ENCOUNTER — Other Ambulatory Visit: Payer: Self-pay | Admitting: Emergency Medicine

## 2013-02-07 ENCOUNTER — Other Ambulatory Visit: Payer: Self-pay | Admitting: Emergency Medicine

## 2013-02-17 ENCOUNTER — Telehealth: Payer: Self-pay | Admitting: Critical Care Medicine

## 2013-02-17 DIAGNOSIS — J449 Chronic obstructive pulmonary disease, unspecified: Secondary | ICD-10-CM

## 2013-02-17 NOTE — Telephone Encounter (Signed)
i spoke with pt and is aware I will send order to [facility]. Nothing further needed

## 2013-02-22 DIAGNOSIS — Z79899 Other long term (current) drug therapy: Secondary | ICD-10-CM | POA: Diagnosis not present

## 2013-02-22 DIAGNOSIS — M5030 Other cervical disc degeneration, unspecified cervical region: Secondary | ICD-10-CM | POA: Diagnosis not present

## 2013-02-22 DIAGNOSIS — IMO0001 Reserved for inherently not codable concepts without codable children: Secondary | ICD-10-CM | POA: Diagnosis not present

## 2013-02-22 DIAGNOSIS — G894 Chronic pain syndrome: Secondary | ICD-10-CM | POA: Diagnosis not present

## 2013-02-25 ENCOUNTER — Other Ambulatory Visit: Payer: Self-pay | Admitting: Emergency Medicine

## 2013-03-01 ENCOUNTER — Other Ambulatory Visit: Payer: Self-pay | Admitting: Emergency Medicine

## 2013-03-10 ENCOUNTER — Telehealth: Payer: Self-pay | Admitting: *Deleted

## 2013-03-10 MED ORDER — POLYETHYLENE GLYCOL 3350 17 GM/SCOOP PO POWD: 17.0000 g | Freq: Every day | ORAL | Status: DC

## 2013-03-10 NOTE — Telephone Encounter (Signed)
lmom that rx was sent into pharmacy 

## 2013-03-10 NOTE — Telephone Encounter (Signed)
Miralax sent.

## 2013-03-10 NOTE — Telephone Encounter (Signed)
Pt would like a refill on his miralax.  This is second call, did not see in system. He was a little upset because he has been waiting for 2 days already.

## 2013-03-22 ENCOUNTER — Ambulatory Visit (INDEPENDENT_AMBULATORY_CARE_PROVIDER_SITE_OTHER): Payer: Medicare Other | Admitting: Emergency Medicine

## 2013-03-22 ENCOUNTER — Encounter: Payer: Self-pay | Admitting: Emergency Medicine

## 2013-03-22 VITALS — BP 120/70 | HR 105 | Temp 98.8°F | Resp 16

## 2013-03-22 DIAGNOSIS — I878 Other specified disorders of veins: Secondary | ICD-10-CM

## 2013-03-22 DIAGNOSIS — R609 Edema, unspecified: Secondary | ICD-10-CM | POA: Diagnosis not present

## 2013-03-22 DIAGNOSIS — I872 Venous insufficiency (chronic) (peripheral): Secondary | ICD-10-CM | POA: Diagnosis not present

## 2013-03-22 DIAGNOSIS — E119 Type 2 diabetes mellitus without complications: Secondary | ICD-10-CM

## 2013-03-22 LAB — BASIC METABOLIC PANEL
BUN: 22 mg/dL (ref 6–23)
CHLORIDE: 95 meq/L — AB (ref 96–112)
CO2: 28 mEq/L (ref 19–32)
Calcium: 9 mg/dL (ref 8.4–10.5)
Creat: 1.05 mg/dL (ref 0.50–1.35)
Glucose, Bld: 281 mg/dL — ABNORMAL HIGH (ref 70–99)
POTASSIUM: 4.4 meq/L (ref 3.5–5.3)
SODIUM: 131 meq/L — AB (ref 135–145)

## 2013-03-22 LAB — GLUCOSE, POCT (MANUAL RESULT ENTRY): POC Glucose: 341 mg/dl — AB (ref 70–99)

## 2013-03-22 LAB — POCT GLYCOSYLATED HEMOGLOBIN (HGB A1C): HEMOGLOBIN A1C: 12.1

## 2013-03-22 NOTE — Progress Notes (Addendum)
Subjective:    Patient ID: Tyrone Cunningham, male    DOB: 1955-06-14, 58 y.o.   MRN: [id number]  This chart was scribed for Tyrone Chris, MD by Blanchard Kelch, ED Scribe.   Chief Complaint  Patient presents with  . Diabetes    PCP: Lucilla Edin, MD  HPI  [name] is a 58 y.o. male with history of chronic respiratory failure, obesity, DM, tracheostomy dependent, OSA, CHF, HTN, hypercholesteremia and peripheral neuropathy. who presents to office for a follow up regarding his diabetes. He is still using Lantus sliding scale for his diabetes and states his blood sugar has been stable. He states he is doing well. He last saw Dr. Delford Field in November. He recently got a new oxygen machine and he believes it has made a difference in improving his breathing. His pulse ox in the office is 96% when tested by me. He has been skipping his Lasix occasionally if he is going out and doesn't know if there will be a bathroom close because he has not made it to the bathroom in time when taking the medication and going out. He states he has intermittent numbness and tingling in his toes. He goes to pain management once a month.    Patient Active Problem List   Diagnosis Date Noted  . Chronic respiratory failure 01/06/2013  . Edema 03/16/2012  . Obesity 10/20/2011  . DM (diabetes mellitus), type 2 with neurological complications 10/17/2011  . 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 OSA (obstructive sleep apnea) 10/16/2011  . [name] with frequent exacerbations   . CHF (congestive heart failure)   . Hypertension   . Hypercholesteremia   . Hearing loss   . Peripheral neuropathy    Past Medical History  Diagnosis Date  . Bronchitis   . CHF (congestive heart failure)   . DM type 2 with diabetic peripheral neuropathy   . Hypertension   . Hypercholesteremia   . Hearing loss   . Peripheral neuropathy   . Sleep apnea     Severe sleep apnea requiring tracheostomy  . COPD (chronic obstructive pulmonary  disease)    Past Surgical History  Procedure Laterality Date  . Tracheostomy    . Appendectomy    . Hand surgery    . Left femor surgery    . Multiple extractions with alveoloplasty N/A 05/11/2012    Procedure: MULTIPLE EXTRACTION WITH ALVEOLOPLASTY;  Surgeon: [name], DDS;  Location: [facility] OR;  Service: Oral Surgery;  Laterality: N/A;   No Known Allergies Prior to Admission medications   Medication Sig Start Date End Date Taking? Authorizing Provider  albuterol (PROVENTIL) (2.5 MG/3ML) 0.083% nebulizer solution Take 3 mLs (2.5 mg total) by nebulization 4 (four) times daily. 06/29/12   Storm Frisk, MD  atorvastatin (LIPITOR) 10 MG tablet TAKE 1 TABLET BY MOUTH EVERY DAY. PATIENT NEEDS OFFICE VISIT AND LABS FOR ADDITIONAL REFILLS 02/07/13   Collene Gobble, MD  Fluticasone-Salmeterol (ADVAIR DISKUS) 500-50 MCG/DOSE AEPB Inhale 1 puff into the lungs 2 (two) times daily. PATIENT NEEDS OFFICE VISIT FOR ADDITIONAL REFILLS 02/25/13   Collene Gobble, MD  furosemide (LASIX) 40 MG tablet TAKE 1 TABLET BY MOUTH TWICE DAILY 10/21/12   Nelva Nay, PA-C  gabapentin (NEURONTIN) 300 MG capsule Take 300-600 mg by mouth 3 (three) times daily. 2-3 capsules daily 03/09/12   Collene Gobble, MD  gabapentin (NEURONTIN) 300 MG capsule TAKE 2 CAPSULES BY MOUTH EVERY MORNING, TAKE 1 CAPSULE BY MOUTH  AT LUNCH, AND TAKE 2 CAPSULES BY MOUTH EVERY EVENING 01/28/13   Collene Gobble, MD  GRALISE 600 MG TABS Take 3 tablets by mouth every morning.    Historical Provider, MD  insulin regular (NOVOLIN R,HUMULIN R) 100 units/mL injection Inject 21-24 Units into the skin 4 (four) times daily - after meals and at bedtime. 05/28/12   Collene Gobble, MD  Insulin Syringe-Needle U-100 (B-D INS SYR ULTRAFINE 1CC/31G) 31G X 5/16" 1 ML MISC Use as Directed 10/23/11   Nelva Nay, PA-C  LANTUS 100 UNIT/ML injection INJECT 90 UNITS UNDER THE SKIN EVERY DAY AS DIRECTED 10/01/12   Collene Gobble, MD  lisinopril (PRINIVIL,ZESTRIL) 5 MG  tablet Take 1 tablet (5 mg total) by mouth daily. 03/01/13   Collene Gobble, MD  oxyCODONE-acetaminophen (PERCOCET) 10-325 MG per tablet Take 1 tablet by mouth every 6 (six) hours as needed for pain.    Historical Provider, MD  polyethylene glycol powder (GLYCOLAX/MIRALAX) powder Take 17 g by mouth daily. 03/10/13   Eleanore E Egan, PA-C  PROAIR HFA 108 (90 BASE) MCG/ACT inhaler INHALE 2 PUFFS BY MOUTH EVERY 4-6 HOURS AS NEEDED 07/31/12   Collene Gobble, MD  spironolactone (ALDACTONE) 25 MG tablet TAKE 1 TABLET BY MOUTH EVERY DAY 01/14/13   Collene Gobble, MD  tiotropium (SPIRIVA HANDIHALER) 18 MCG inhalation capsule Place 1 capsule (18 mcg total) into inhaler and inhale daily. PATIENT NEEDS OFFICE VISIT FOR ADDITIONAL REFILLS 02/25/13   Collene Gobble, MD  traMADol (ULTRAM) 50 MG tablet Take 100 mg by mouth every 6 (six) hours as needed for pain.    Historical Provider, MD  VOLTAREN 1 % GEL as needed.    Historical Provider, MD   History   Social History  . Marital Status: Single    Spouse Name: N/A    Number of Children: N/A  . Years of Education: N/A   Occupational History  . Not on file.   Social History Main Topics  . Smoking status: Former Smoker -- 2.00 packs/day for 30 years    Types: Cigarettes    Quit date: 07/02/1999  . Smokeless tobacco: Never Used  . Alcohol Use: No  . Drug Use: No  . Sexual Activity: No   Other Topics Concern  . Not on file   Social History Narrative  . No narrative on file    Review of Systems Review of Systems: Consitutional: No fever, chills, fatigue, night sweats, lymphadenopathy, or weight changes. Eyes: No visual changes, eye redness, or discharge. ENT/Mouth: Ears: No otalgia, tinnitus, hearing loss, discharge. Nose: No congestion, rhinorrhea, sinus pain, or epistaxis. Throat: No sore throat, post nasal drip, or teeth pain. Cardiovascular: No CP, palpitations, diaphoresis, DOE, edema, orthopnea, PND. Respiratory: No cough, hemoptysis. Positive  for chronic SOB. Gastrointestinal: No anorexia, dysphagia, reflux, pain, nausea, vomiting, hematemesis, diarrhea, constipation, BRBPR, or melena. Genitourinary: No dysuria, frequency, urgency, hematuria, incontinence, nocturia, decreased urinary stream, discharge, impotence, or testicular pain/masses. Musculoskeletal: No decreased ROM, myalgias, stiffness, joint swelling, or weakness. Skin: No rash, erythema, lesion changes, pain, warmth, jaundice, or pruritis. Neurological: No headache, dizziness, syncope, seizures, tremors, memory loss, coordination problems. paresthesias. Psychological: No anxiety, depression, hallucinations, SI/HI. Endocrine: No fatigue, polydipsia, polyphagia, polyuria All other systems were reviewed and are otherwise negative.      Objective:   Physical Exam  General: Well-developed, well-nourished male in no acute distress; appearance consistent with age of record HENT: normocephalic; atraumatic  Throat: tracheostomy present Eyes: pupils equal,  round and reactive to light; extraocular muscles intact Neck: supple Heart: regular rate and rhythm; no murmurs, rubs or gallops Lungs: clear to auscultation bilaterally; decreased breath sounds both bases Abdomen: soft; nondistended; nontender; no masses or hepatosplenomegaly; bowel sounds present Extremities: No deformity; full range of motion; pulses normal; severe bilateral stasis changes; scaling and peeling of toes and bottoms of both feet Neurologic: Awake, alert and oriented; motor function intact in all extremities and symmetric; no facial droop; decreased sensation in feet bilaterally.  Skin: Warm and dry;  Psychiatric: Normal mood and affect Results for orders placed in visit on 03/22/13  GLUCOSE, POCT (MANUAL RESULT ENTRY)      Result Value Range   POC Glucose 341 (*) 70 - 99 mg/dl  POCT GLYCOSYLATED HEMOGLOBIN (HGB A1C)      Result Value Range   Hemoglobin A1C 12.1          Assessment & Plan:  Patient  has not been watching his diet he's not been taking his insulin appropriately he's not interested in endocrine referral at the present time. I did advise him his high risk for complications related to his uncontrolled diabetes. He does understand and states he will try to work harder on getting his insulin regular and eating the appropriate foods  I personally performed the services described in this documentation, which was scribed in my presence. The recorded information has been reviewed and is accurate.

## 2013-03-24 DIAGNOSIS — IMO0002 Reserved for concepts with insufficient information to code with codable children: Secondary | ICD-10-CM | POA: Diagnosis not present

## 2013-03-24 DIAGNOSIS — G894 Chronic pain syndrome: Secondary | ICD-10-CM | POA: Diagnosis not present

## 2013-03-24 DIAGNOSIS — Z79899 Other long term (current) drug therapy: Secondary | ICD-10-CM | POA: Diagnosis not present

## 2013-03-24 DIAGNOSIS — M1710 Unilateral primary osteoarthritis, unspecified knee: Secondary | ICD-10-CM | POA: Diagnosis not present

## 2013-03-24 DIAGNOSIS — M5030 Other cervical disc degeneration, unspecified cervical region: Secondary | ICD-10-CM | POA: Diagnosis not present

## 2013-03-29 ENCOUNTER — Other Ambulatory Visit: Payer: Self-pay | Admitting: Emergency Medicine

## 2013-04-01 ENCOUNTER — Other Ambulatory Visit: Payer: Self-pay | Admitting: Emergency Medicine

## 2013-04-05 ENCOUNTER — Other Ambulatory Visit: Payer: Self-pay | Admitting: Emergency Medicine

## 2013-04-19 ENCOUNTER — Other Ambulatory Visit: Payer: Self-pay | Admitting: Physician Assistant

## 2013-05-06 ENCOUNTER — Other Ambulatory Visit: Payer: Self-pay | Admitting: Physician Assistant

## 2013-05-06 ENCOUNTER — Other Ambulatory Visit: Payer: Self-pay | Admitting: Emergency Medicine

## 2013-05-12 ENCOUNTER — Other Ambulatory Visit: Payer: Self-pay | Admitting: Emergency Medicine

## 2013-05-12 DIAGNOSIS — Z930 Tracheostomy status: Secondary | ICD-10-CM | POA: Diagnosis not present

## 2013-05-12 DIAGNOSIS — G4730 Sleep apnea, unspecified: Secondary | ICD-10-CM | POA: Diagnosis not present

## 2013-05-19 DIAGNOSIS — G894 Chronic pain syndrome: Secondary | ICD-10-CM | POA: Diagnosis not present

## 2013-05-19 DIAGNOSIS — M47812 Spondylosis without myelopathy or radiculopathy, cervical region: Secondary | ICD-10-CM | POA: Diagnosis not present

## 2013-05-19 DIAGNOSIS — Z79899 Other long term (current) drug therapy: Secondary | ICD-10-CM | POA: Diagnosis not present

## 2013-05-19 DIAGNOSIS — M5030 Other cervical disc degeneration, unspecified cervical region: Secondary | ICD-10-CM | POA: Diagnosis not present

## 2013-05-19 DIAGNOSIS — G5600 Carpal tunnel syndrome, unspecified upper limb: Secondary | ICD-10-CM | POA: Diagnosis not present

## 2013-06-09 ENCOUNTER — Other Ambulatory Visit: Payer: Self-pay | Admitting: Emergency Medicine

## 2013-06-21 ENCOUNTER — Ambulatory Visit (INDEPENDENT_AMBULATORY_CARE_PROVIDER_SITE_OTHER): Payer: Medicare Other | Admitting: Emergency Medicine

## 2013-06-21 ENCOUNTER — Encounter: Payer: Self-pay | Admitting: Emergency Medicine

## 2013-06-21 VITALS — BP 111/63 | HR 122 | Temp 100.2°F | Resp 18 | Wt 358.2 lb

## 2013-06-21 DIAGNOSIS — E1149 Type 2 diabetes mellitus with other diabetic neurological complication: Secondary | ICD-10-CM | POA: Diagnosis not present

## 2013-06-21 DIAGNOSIS — G4733 Obstructive sleep apnea (adult) (pediatric): Secondary | ICD-10-CM

## 2013-06-21 DIAGNOSIS — G894 Chronic pain syndrome: Secondary | ICD-10-CM

## 2013-06-21 LAB — GLUCOSE, POCT (MANUAL RESULT ENTRY): POC GLUCOSE: 138 mg/dL — AB (ref 70–99)

## 2013-06-21 LAB — POCT GLYCOSYLATED HEMOGLOBIN (HGB A1C): Hemoglobin A1C: 11.8

## 2013-06-21 MED ORDER — GABAPENTIN 300 MG PO CAPS: ORAL_CAPSULE | ORAL | Status: DC

## 2013-06-21 NOTE — Progress Notes (Signed)
Subjective:  This chart was scribed for Tyrone Chris, MD by Quintella Reichert, Scribe.  This patient was seen in [facility] Room 22 and the patient's care was started at 3:06 PM.   Patient ID: Tyrone Cunningham, male    DOB: 1955/04/29, 58 y.o.   MRN: [id number]  Chief Complaint  Patient presents with  . 3 month check up    HPI  HPI Comments: Tyrone Cunningham is a 58 y.o. male with h/o COPD (tracheostomy dependent), CHF, chronic respiratory failure, OSA, DM, HTN, hypercholesteremia, and obesity who presents to [facility] for a 15-month check up.  Pt states he is doing okay overall.  He has no new complaints about his breathing.  He is on oxygen more often than he used to but this helps him to feel better.  He hooks the oxygen on a mask over his tracheostomy.  He notes that he has a red rash on his lower legs, worse on the right side where his wife "tried to scrub some bumps off my leg" a few years ago.    Pt is requesting a refill on his Neurontin 300mg .  He takes 3 tablets in the morning, and sometimes 3-4 more in the evening.  He uses 90 units of Lantus/day, and 21-24 units of Novolog, but he admits to being frequently noncompliant.    He states he stopped eating rice and has been eating more salad.  He is still having "a little problem with the sweets."   Pt has his tracheostomy changed regularly by Dr. Lazarus Salines.   Patient Active Problem List   Diagnosis Date Noted  . Chronic respiratory failure 01/06/2013  . Edema 03/16/2012  . Obesity 10/20/2011  . DM (diabetes mellitus), type 2 with neurological complications 10/17/2011  . 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 OSA (obstructive sleep apnea) 10/16/2011  . [name] with frequent exacerbations   . CHF (congestive heart failure)   . Hypertension   . Hypercholesteremia   . Hearing loss   . Peripheral neuropathy     Past Medical History  Diagnosis Date  . Bronchitis   . CHF (congestive heart failure)   . DM type 2 with diabetic peripheral  neuropathy   . Hypertension   . Hypercholesteremia   . Hearing loss   . Peripheral neuropathy   . Sleep apnea     Severe sleep apnea requiring tracheostomy  . COPD (chronic obstructive pulmonary disease)     Past Surgical History  Procedure Laterality Date  . Tracheostomy    . Appendectomy    . Hand surgery    . Left femor surgery    . Multiple extractions with alveoloplasty N/A 05/11/2012    Procedure: MULTIPLE EXTRACTION WITH ALVEOLOPLASTY;  Surgeon: [name], DDS;  Location: [facility] OR;  Service: Oral Surgery;  Laterality: N/A;    Prior to Admission medications   Medication Sig Start Date End Date Taking? Authorizing Provider  albuterol (PROVENTIL) (2.5 MG/3ML) 0.083% nebulizer solution Take 3 mLs (2.5 mg total) by nebulization 4 (four) times daily. 06/29/12   Storm Frisk, MD  atorvastatin (LIPITOR) 10 MG tablet Take 1 tablet (10 mg total) by mouth daily.    Collene Gobble, MD  Fluticasone-Salmeterol (ADVAIR DISKUS) 500-50 MCG/DOSE AEPB Inhale 1 puff into the lungs 2 (two) times daily. 03/29/13   Collene Gobble, MD  furosemide (LASIX) 40 MG tablet TAKE 1 TABLET BY MOUTH TWICE DAILY 10/21/12   Nelva Nay, PA-C  gabapentin (NEURONTIN) 300 MG capsule Take  300-600 mg by mouth 3 (three) times daily. 2-3 capsules daily 03/09/12   Collene GobbleSteven A Daub, MD  gabapentin (NEURONTIN) 300 MG capsule TAKE 2 CAPSULES BY MOUTH EVERY MORNING, TAKE 1 CAPSULE BY MOUTH AT LUNCH, AND TAKE 2 CAPSULES BY MOUTH EVERY EVENING 01/28/13   Collene GobbleSteven A Daub, MD  gabapentin (NEURONTIN) 300 MG capsule TAKE 2 CAPSULES BY MOUTH EVERY MORNING AND 1 CAPSULE BY MOUTH EVERY EVENING    Collene GobbleSteven A Daub, MD  GRALISE 600 MG TABS Take 3 tablets by mouth every morning.    Historical Provider, MD  insulin regular (NOVOLIN R,HUMULIN R) 100 units/mL injection Inject 21-24 Units into the skin 4 (four) times daily - after meals and at bedtime. 05/28/12   Collene GobbleSteven A Daub, MD  Insulin Syringe-Needle U-100 (B-D INS SYR ULTRAFINE 1CC/31G) 31G X  5/16" 1 ML MISC Use as Directed 10/23/11   Heather M Marte, PA-C  LANTUS 100 UNIT/ML injection INJECT 90 UNITS UNDER THE SKIN EVERY DAY AS DIRECTED    Collene GobbleSteven A Daub, MD  lisinopril (PRINIVIL,ZESTRIL) 5 MG tablet TAKE 1 TABLET BY MOUTH EVERY DAY    Ryan M Dunn, PA-C  oxyCODONE-acetaminophen (PERCOCET) 10-325 MG per tablet Take 1 tablet by mouth every 6 (six) hours as needed for pain.    Historical Provider, MD  polyethylene glycol powder (GLYCOLAX/MIRALAX) powder Take 17 g by mouth daily. 03/10/13   Eleanore E Egan, PA-C  PROAIR HFA 108 (90 BASE) MCG/ACT inhaler INHALE 2 PUFFS BY MOUTH EVERY 4-6 HOURS AS NEEDED 07/31/12   Collene GobbleSteven A Daub, MD  SPIRIVA HANDIHALER 18 MCG inhalation capsule PLACE ONE CAPSULE INTO HANDIHALER AND INHALE DAILY 03/29/13   Collene GobbleSteven A Daub, MD  spironolactone (ALDACTONE) 25 MG tablet TAKE 1 TABLET BY MOUTH EVERY DAY 01/14/13   Collene GobbleSteven A Daub, MD  traMADol (ULTRAM) 50 MG tablet Take 100 mg by mouth every 6 (six) hours as needed for pain.    Historical Provider, MD  VOLTAREN 1 % GEL as needed.    Historical Provider, MD    History   Social History  . Marital Status: Single    Spouse Name: N/A    Number of Children: N/A  . Years of Education: N/A   Occupational History  . Not on file.   Social History Main Topics  . Smoking status: Former Smoker -- 2.00 packs/day for 30 years    Types: Cigarettes    Quit date: 07/02/1999  . Smokeless tobacco: Never Used  . Alcohol Use: No  . Drug Use: No  . Sexual Activity: No   Other Topics Concern  . Not on file   Social History Narrative  . No narrative on file     Review of Systems  Respiratory: Positive for shortness of breath.   Skin: Positive for rash.         Objective:   Physical Exam CONSTITUTIONAL: Morbidly obese male HEAD: Normocephalic/atraumatic EYES: EOMI/PERRL ENMT: Mucous membranes moist NECK: supple no meningeal signs. Trach site not infected. SPINE:entire spine nontender CV: S1/S2 noted, no  murmurs/rubs/gallops noted LUNGS: Decreased breath sounds in the bases ABDOMEN: morbidly obesesoft, nontender, no rebound or guarding GU:no cva tenderness NEURO: Pt is awake/alert, moves all extremitiesx4 EXTREMITIES: pulses normal, full ROM. Stasis changes of both lower legs, worse on the right. SKIN: warm, color normal PSYCH: no abnormalities of mood noted   BP 111/63  Pulse 122  Temp(Src) 100.2 F (37.9 C) (Oral)  Resp 18  Wt 358 lb 3.2 oz (162.478 kg)  SpO2 92% Body mass index is 59.61 kg/(m^2).  Results for orders placed in visit on 06/21/13  GLUCOSE, POCT (MANUAL RESULT ENTRY)      Result Value Ref Range   POC Glucose 138 (*) 70 - 99 mg/dl  POCT GLYCOSYLATED HEMOGLOBIN (HGB A1C)      Result Value Ref Range   Hemoglobin A1C 11.8          Assessment & Plan:   Hemoglobin A1c is the same. I'm sure this is all dietary and noncompliance. I don't think changing medications we'll make any difference. I did change his gabapentin he'll continue on his long-acting gabapentin in the morning and at night he can take 3-4 tablets as needed for pain  I personally performed the services described in this documentation, which was scribed in my presence. The recorded information has been reviewed and is accurate.

## 2013-06-30 ENCOUNTER — Other Ambulatory Visit: Payer: Self-pay | Admitting: Physician Assistant

## 2013-07-03 IMAGING — CR DG CHEST 2V
3 series · 3 of 3 positions shown · non-contrast
Comparison: 05/11/2012.

CLINICAL DATA: Cough.

CHEST - 2 VIEW

[PA (1 of 2)]
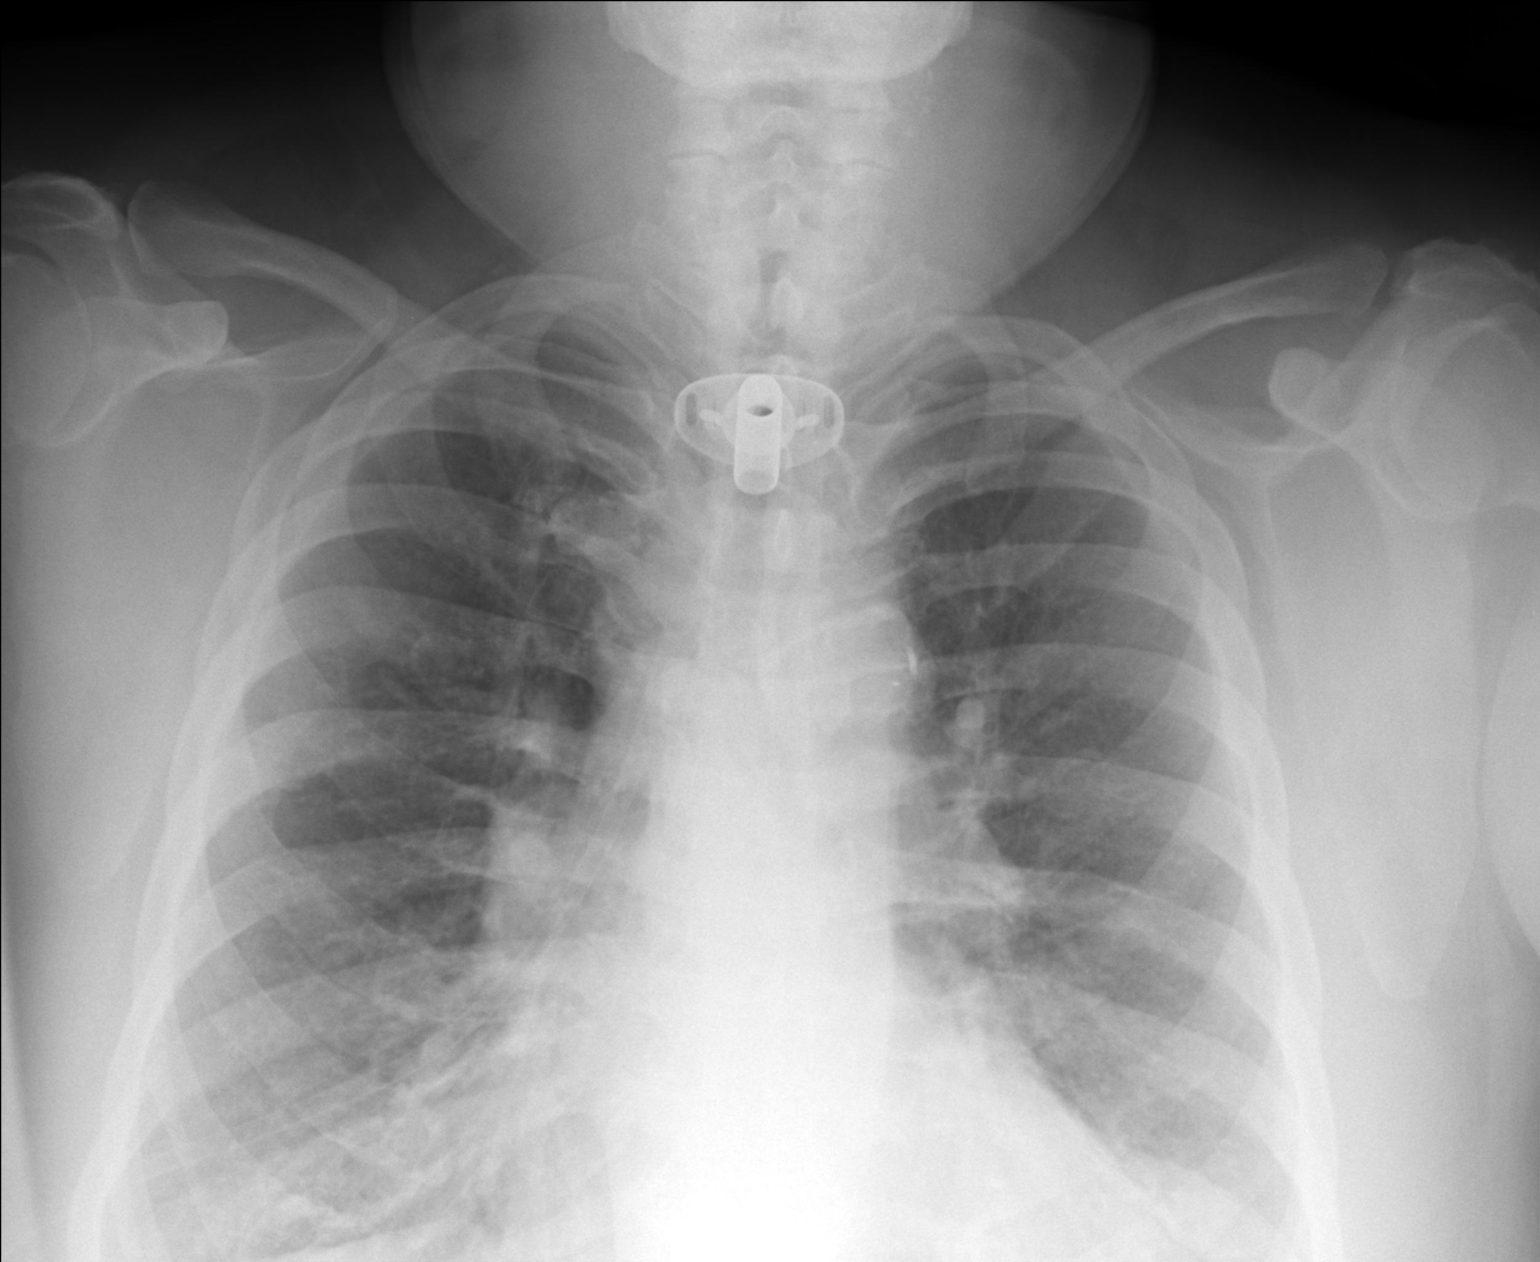

[lateral]
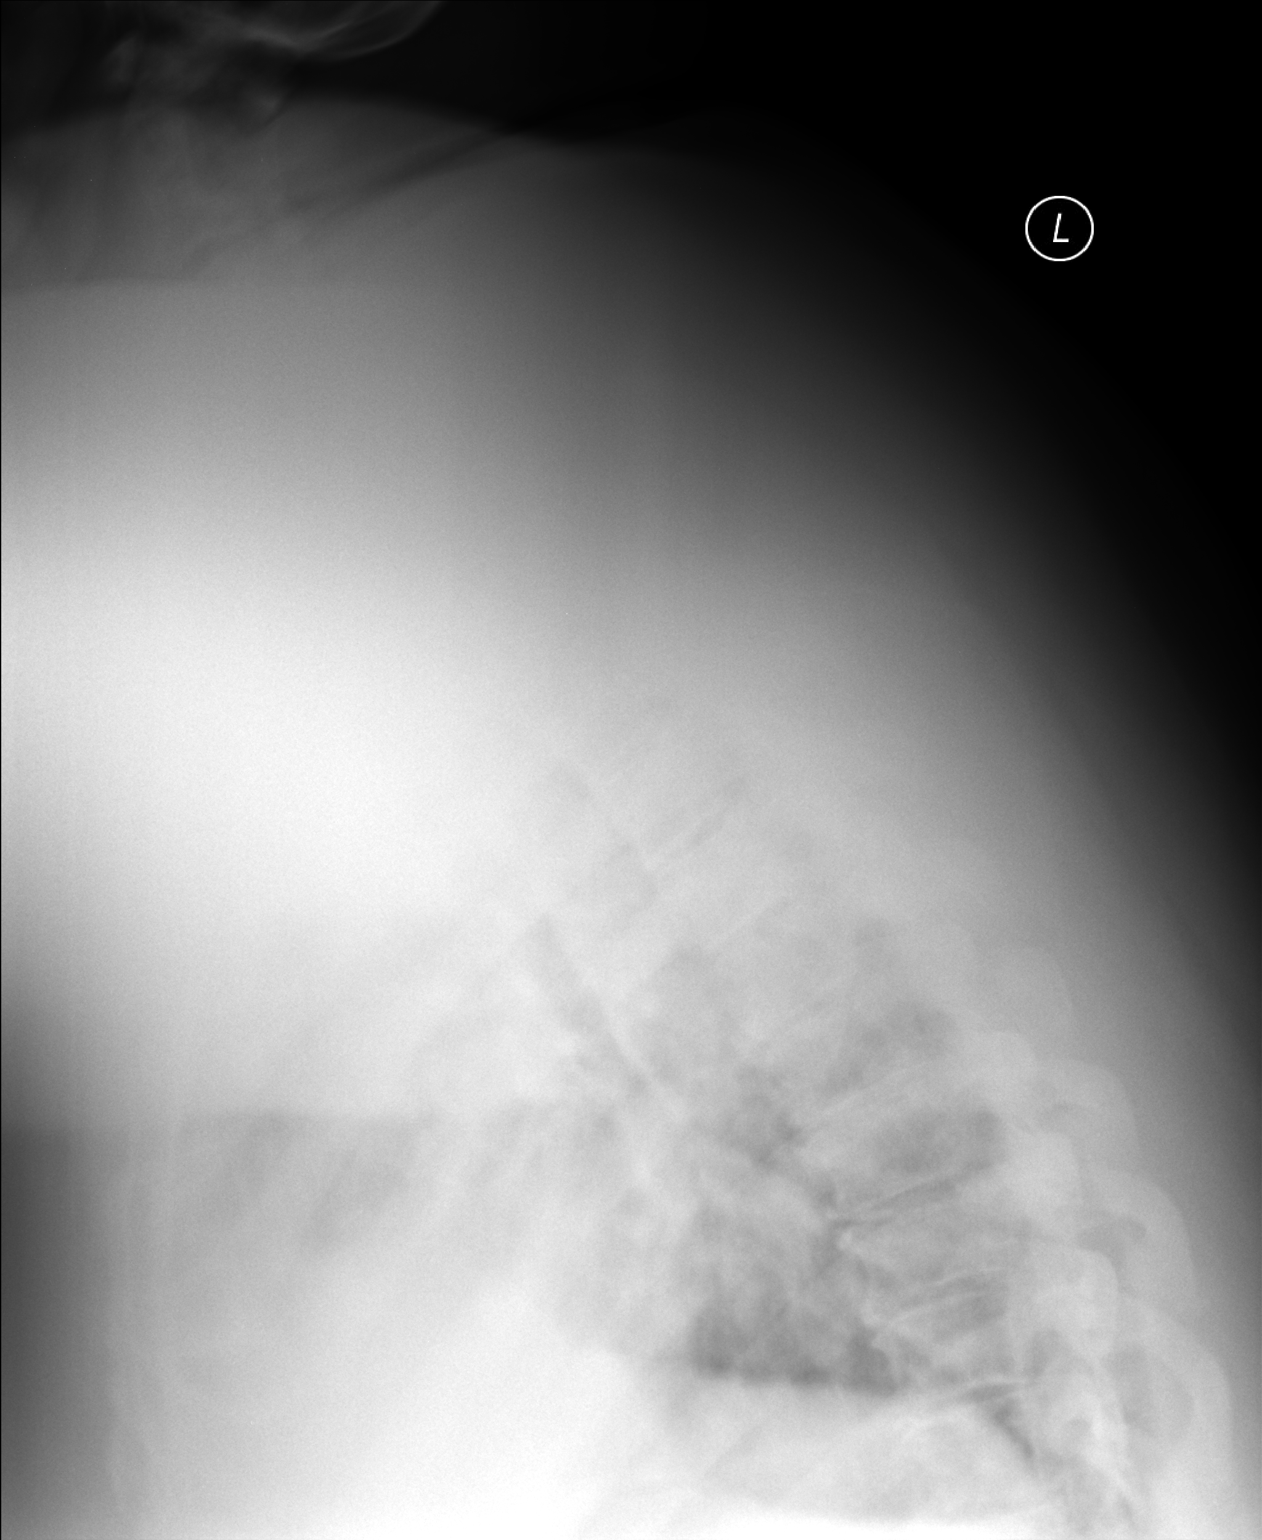

[PA (2 of 2)]
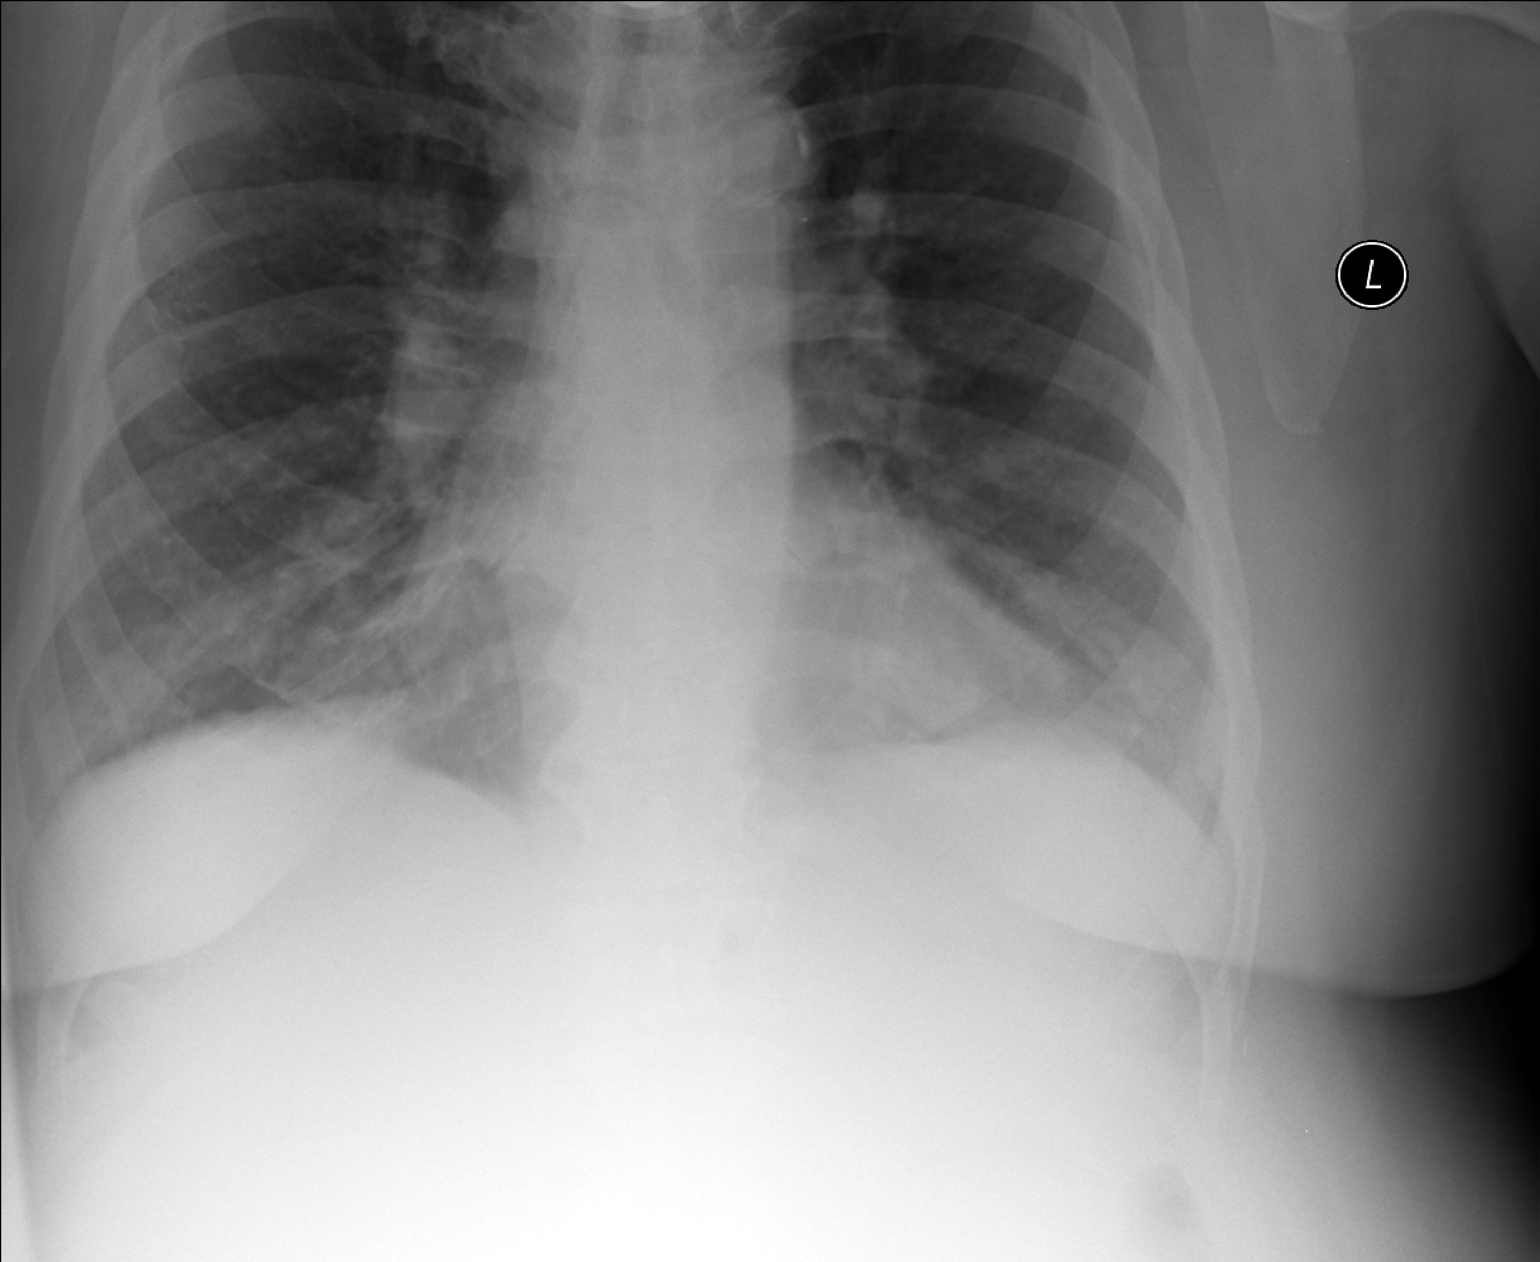

[3 of 3 positions shown; findings below may reference images not displayed]

FINDINGS: Tracheostomy is midline.  Heart size stable.  Mild
diffuse interstitial prominence appears unchanged.  No pleural
fluid.  Lateral view is degraded by motion and body habitus.
IMPRESSION: No acute findings.

Clinically significant discrepancy from primary report, if
provided: None

## 2013-07-09 ENCOUNTER — Other Ambulatory Visit: Payer: Self-pay | Admitting: Emergency Medicine

## 2013-07-14 DIAGNOSIS — M1710 Unilateral primary osteoarthritis, unspecified knee: Secondary | ICD-10-CM | POA: Diagnosis not present

## 2013-07-14 DIAGNOSIS — G894 Chronic pain syndrome: Secondary | ICD-10-CM | POA: Diagnosis not present

## 2013-07-14 DIAGNOSIS — Z79899 Other long term (current) drug therapy: Secondary | ICD-10-CM | POA: Diagnosis not present

## 2013-07-14 DIAGNOSIS — M5030 Other cervical disc degeneration, unspecified cervical region: Secondary | ICD-10-CM | POA: Diagnosis not present

## 2013-07-28 IMAGING — CR DG KNEE 1-2V*L*
3 series · 3 of 3 positions shown · non-contrast
Comparison: None.

CLINICAL DATA: Bilateral knee pain.

LEFT KNEE - 1-2 VIEW

[w knee lat. left * (1 of 2)]
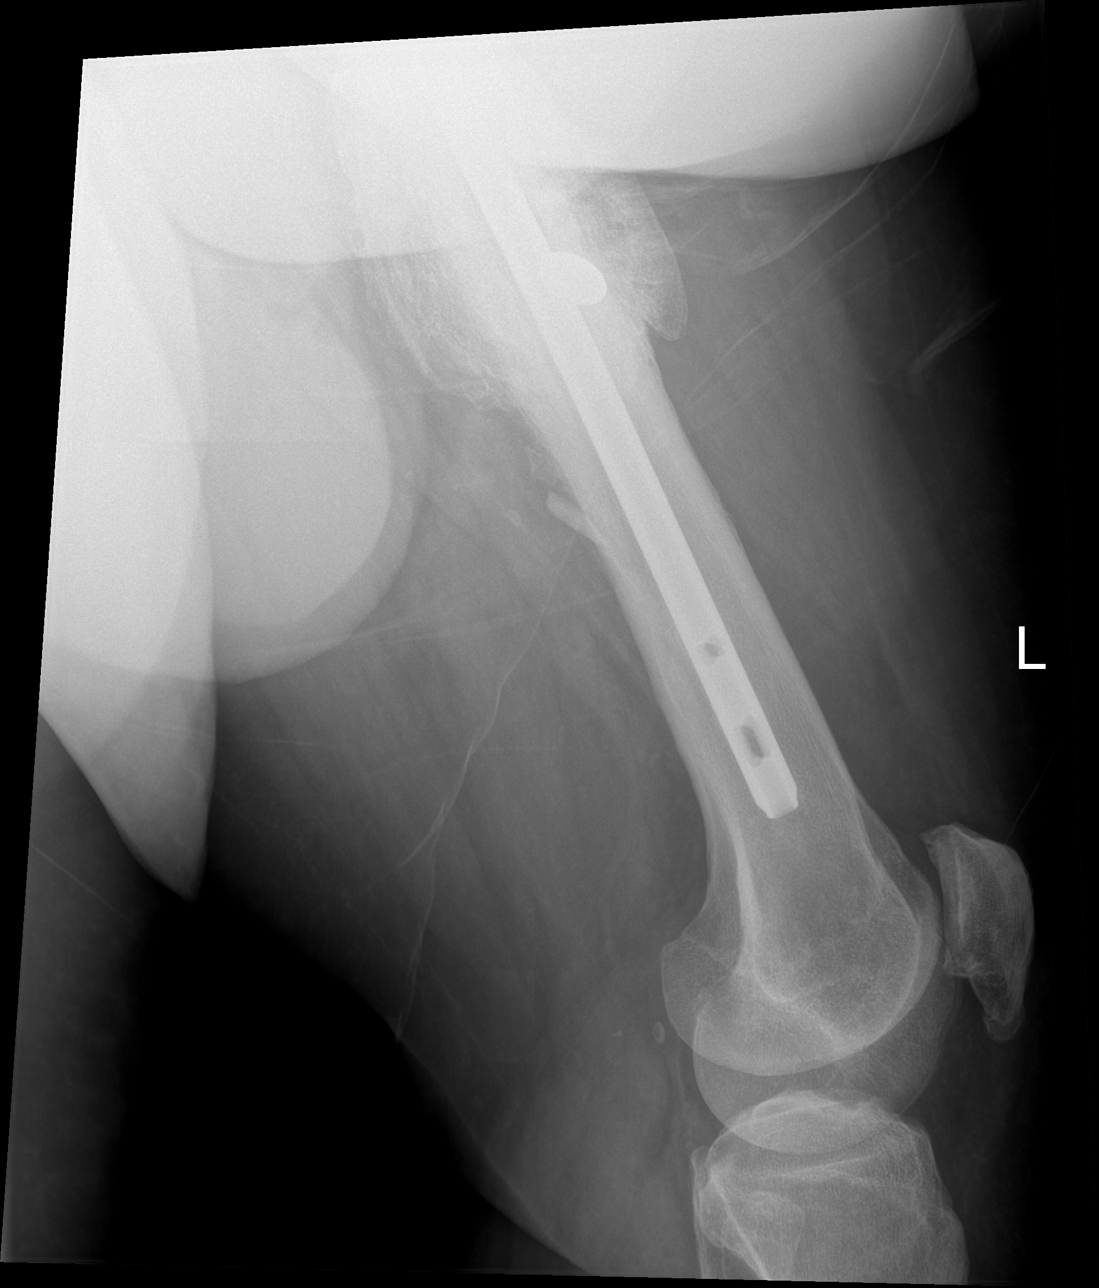

[w knee lat. left * (2 of 2)]
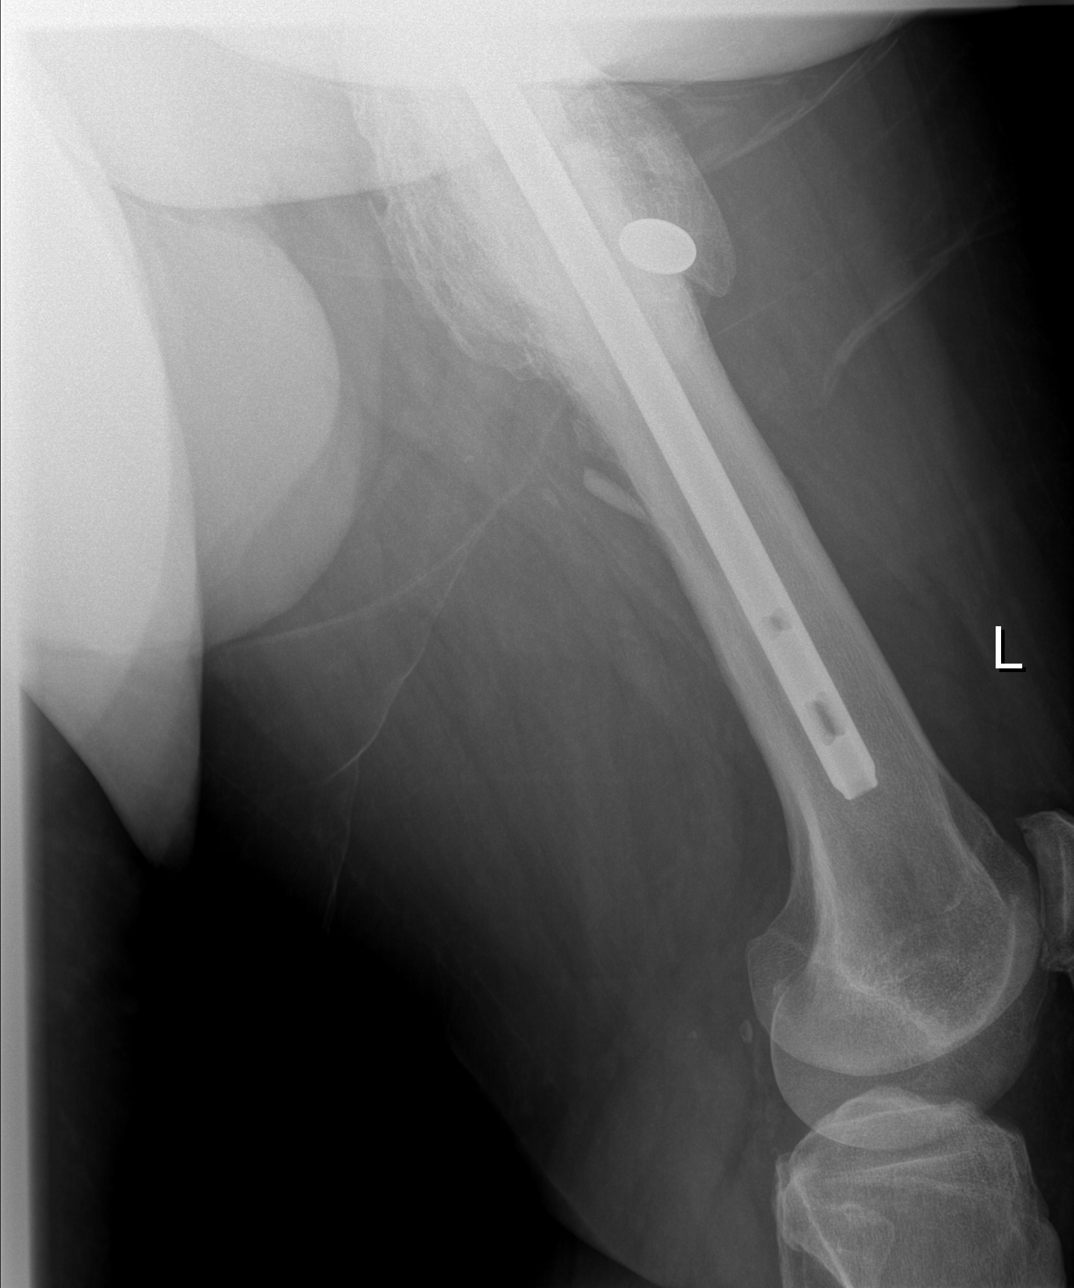

[w knee ap left *]
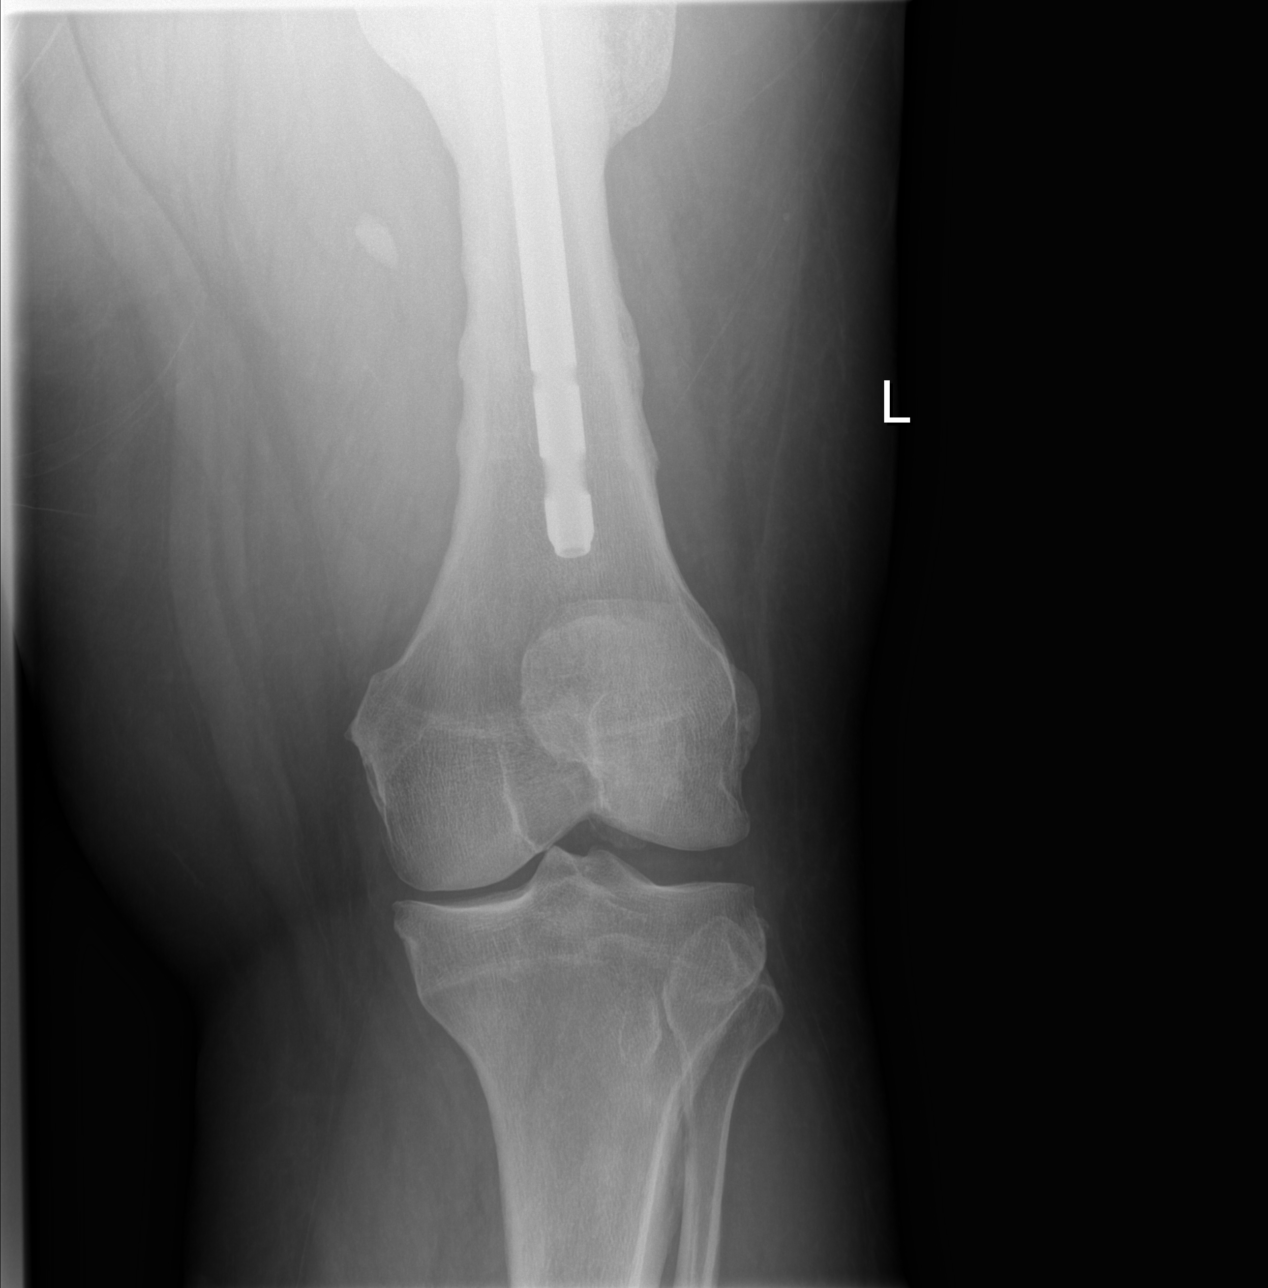

[3 of 3 positions shown; findings below may reference images not displayed]

FINDINGS: Antegrade left femoral nail is present.  Metallic object
projects over the fracture callus, presumably external to the
patient and not present on the prior exam of 3889.  The bone is
markedly hypertrophic around the old fracture plane.  Chronic
indolent infection cannot be excluded.  The left knee shows a mild
varus deformity with medial compartment joint space narrowing.
Patellofemoral osteoarthritis is present with small marginal
osteophytes.  Small likely degenerative effusion.
IMPRESSION: 1.  Moderate medial and patellofemoral osteoarthritis with
associated varus deformity of the knee.
2.  Antegrade left femoral nail with markedly hypertrophic bone
around the healed left VasoSeal femur fracture.  Chronic infection
cannot be excluded however this may represent a complicated
fracture healing without infection as well.  Presumably, this has
been followed by orthopedic surgery.
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
## 2013-08-04 ENCOUNTER — Other Ambulatory Visit: Payer: Self-pay | Admitting: Emergency Medicine

## 2013-08-09 ENCOUNTER — Telehealth: Payer: Self-pay

## 2013-08-09 NOTE — Telephone Encounter (Signed)
[facility] faxed Letter of Med Nec for renewal of Neb meds and equip. Put in Dr Ellis Parents box.

## 2013-08-31 ENCOUNTER — Other Ambulatory Visit: Payer: Self-pay | Admitting: Physician Assistant

## 2013-08-31 NOTE — Telephone Encounter (Signed)
Dr Cleta Albertsaub, you recently saw pt for check up but not for this med. Can we give RFs?

## 2013-09-01 ENCOUNTER — Other Ambulatory Visit: Payer: Self-pay | Admitting: Emergency Medicine

## 2013-09-08 DIAGNOSIS — M1990 Unspecified osteoarthritis, unspecified site: Secondary | ICD-10-CM | POA: Diagnosis not present

## 2013-09-08 DIAGNOSIS — M5030 Other cervical disc degeneration, unspecified cervical region: Secondary | ICD-10-CM | POA: Diagnosis not present

## 2013-09-08 DIAGNOSIS — G894 Chronic pain syndrome: Secondary | ICD-10-CM | POA: Diagnosis not present

## 2013-09-08 DIAGNOSIS — Z79899 Other long term (current) drug therapy: Secondary | ICD-10-CM | POA: Diagnosis not present

## 2013-09-10 ENCOUNTER — Other Ambulatory Visit: Payer: Self-pay | Admitting: Emergency Medicine

## 2013-09-13 ENCOUNTER — Other Ambulatory Visit: Payer: Self-pay | Admitting: Emergency Medicine

## 2013-09-20 ENCOUNTER — Ambulatory Visit (INDEPENDENT_AMBULATORY_CARE_PROVIDER_SITE_OTHER): Payer: Medicare Other | Admitting: Emergency Medicine

## 2013-09-20 ENCOUNTER — Encounter: Payer: Self-pay | Admitting: Emergency Medicine

## 2013-09-20 VITALS — BP 113/70 | HR 115 | Temp 99.9°F | Resp 18

## 2013-09-20 DIAGNOSIS — G4733 Obstructive sleep apnea (adult) (pediatric): Secondary | ICD-10-CM | POA: Diagnosis not present

## 2013-09-20 DIAGNOSIS — Z23 Encounter for immunization: Secondary | ICD-10-CM

## 2013-09-20 DIAGNOSIS — Z125 Encounter for screening for malignant neoplasm of prostate: Secondary | ICD-10-CM

## 2013-09-20 DIAGNOSIS — I10 Essential (primary) hypertension: Secondary | ICD-10-CM | POA: Diagnosis not present

## 2013-09-20 DIAGNOSIS — J441 Chronic obstructive pulmonary disease with (acute) exacerbation: Secondary | ICD-10-CM | POA: Diagnosis not present

## 2013-09-20 DIAGNOSIS — E119 Type 2 diabetes mellitus without complications: Secondary | ICD-10-CM | POA: Diagnosis not present

## 2013-09-20 DIAGNOSIS — R609 Edema, unspecified: Secondary | ICD-10-CM | POA: Diagnosis not present

## 2013-09-20 LAB — COMPREHENSIVE METABOLIC PANEL
ALBUMIN: 3.6 g/dL (ref 3.5–5.2)
ALT: 18 U/L (ref 0–53)
AST: 15 U/L (ref 0–37)
Alkaline Phosphatase: 95 U/L (ref 39–117)
BUN: 20 mg/dL (ref 6–23)
CHLORIDE: 93 meq/L — AB (ref 96–112)
CO2: 30 mEq/L (ref 19–32)
Calcium: 9.7 mg/dL (ref 8.4–10.5)
Creat: 0.9 mg/dL (ref 0.50–1.35)
GLUCOSE: 272 mg/dL — AB (ref 70–99)
Potassium: 4.6 mEq/L (ref 3.5–5.3)
SODIUM: 132 meq/L — AB (ref 135–145)
Total Bilirubin: 0.3 mg/dL (ref 0.2–1.2)
Total Protein: 6.9 g/dL (ref 6.0–8.3)

## 2013-09-20 LAB — CBC
HCT: 47.6 % (ref 39.0–52.0)
HEMOGLOBIN: 16.4 g/dL (ref 13.0–17.0)
MCH: 27.7 pg (ref 26.0–34.0)
MCHC: 34.5 g/dL (ref 30.0–36.0)
MCV: 80.5 fL (ref 78.0–100.0)
PLATELETS: 246 10*3/uL (ref 150–400)
RBC: 5.91 MIL/uL — AB (ref 4.22–5.81)
RDW: 16.2 % — ABNORMAL HIGH (ref 11.5–15.5)
WBC: 10 10*3/uL (ref 4.0–10.5)

## 2013-09-20 LAB — GLUCOSE, POCT (MANUAL RESULT ENTRY): POC GLUCOSE: 304 mg/dL — AB (ref 70–99)

## 2013-09-20 LAB — POCT GLYCOSYLATED HEMOGLOBIN (HGB A1C): HEMOGLOBIN A1C: 14

## 2013-09-20 NOTE — Progress Notes (Signed)
   Subjective:    Patient ID: Tyrone Cunningham, male    DOB: 05/27/1955, 58 y.o.   MRN: [id number]  HPI patient in for followup of his diabetes. His COPD has been stable he uses his nebulizer and inhalers as instructed. He has regular cleaning of his trach site. He has not been compliant regarding his insulin administration or his diet.    Review of Systems     Objective:   Physical Exam patient is alert and cooperative. Trach site does not appear infected. Breath sounds are symmetrical. He is tachycardic without irregularity or murmurs abdomen was protuberant without masses. Glucose 304      Assessment & Plan:  Diabetes continues to be controlled due to lack of exercise dietary indiscretions and am not sure he even checks his sugar as he states he does. He has never brought in the readings

## 2013-09-20 NOTE — Patient Instructions (Signed)
Pneumococcal Conjugate Vaccine  What You Need to Know  Your doctor recommends that you, or your child, get a dose of PCV13 vaccine today.  WHY GET VACCINATED?  Pneumococcal conjugate vaccine (called PCV13 or Prevnar 13) is recommended to protect infants and toddlers, and some older children and adults with certain health conditions, from pneumococcal disease.  Pneumococcal disease is caused by infection with Streptococcus pneumoniae bacteria. These bacteria can spread from person to person through close contact.  Pneumococcal disease can lead to severe health problems, including pneumonia, blood infections, and meningitis.  Meningitis is an infection of the covering of the brain. Pneumococcal meningitis is fairly rare (less than 1 case per 100,000 people each year), but it leads to other health problems, including deafness and brain damage. In children, it is fatal in about 1 case out of 10.  Children younger than two are at higher risk for serious disease than older children.  People with certain medical conditions, people over age 65, and cigarette smokers are also at higher risk.  Before vaccine, pneumococcal infections caused many problems each year in the [place] in children younger than 5, including:  · more than 700 cases of meningitis,  · 13,000 blood infections,  · about 5 million ear infections, and  · about 200 deaths.  About 4,000 adults still die each year because of pneumococcal infections.  Pneumococcal infections can be hard to treat because some strains are resistant to antibiotics. This makes prevention through vaccination even more important.  PCV13 VACCINE  There are more than 90 types of pneumococcal bacteria. PCV13 protects against 13 of them. These 13 strains cause most severe infections in children and about half of infections in adults.   PCV13 is routinely given to children at 2, 4, 6, and 12-15 months of age. Children in this age range are at greatest risk for serious diseases caused  by pneumococcal infection.  PCV13 vaccine may also be recommended for some older children or adults. Your doctor can give you details.  A second type of pneumococcal vaccine, called PPSV23, may also be given to some children and adults, including anyone over age 65. There is a separate Vaccine Information Statement for this vaccine.  PRECAUTIONS   Anyone who has ever had a life-threatening allergic reaction to a dose of this vaccine, to an earlier pneumococcal vaccine called PCV7 (or Prevnar), or to any vaccine containing diphtheria toxoid (for example, DTaP), should not get PCV13.  Anyone with a severe allergy to any component of PCV13 should not get the vaccine. Tell your doctor if the person being vaccinated has any severe allergies.  If the person scheduled for vaccination is sick, your doctor might decide to reschedule the shot on another day.  Your doctor can give you more information about any of these precautions.  RISKS   With any medicine, including vaccines, there is a chance of side effects. These are usually mild and go away on their own, but serious reactions are also possible.  Reported problems associated with PCV13 vary by dose and age, but generally:  · About half of children became drowsy after the shot, had a temporary loss of appetite, or had redness or tenderness where the shot was given.  · About 1 out of 3 had swelling where the shot was given.  · About 1 out of 3 had a mild fever, and about 1 in 20 had a higher fever (over 102.2° F or 39° C).  · Up to about 8   out of 10 became fussy or irritable.  Adults receiving the vaccine have reported redness, pain, and swelling where the shot was given. Mild fever, fatigue, headache, chills, or muscle pain have also been reported.  Life-threatening allergic reactions from any vaccine are very rare.  WHAT IF THERE IS A SERIOUS REACTION?  What should I look for?  Look for anything that concerns you, such as signs of a severe allergic reaction, very high  fever, or behavior changes.  Signs of a severe allergic reaction can include hives, swelling of the face and throat, difficulty breathing, a fast heartbeat, dizziness, and weakness. These would start a few minutes to a few hours after the vaccination.  What should I do?  · If you think it is a severe allergic reaction or other emergency that can't wait, get the person to the nearest hospital or call 9-1-1. Otherwise, call your doctor.  · Afterward, the reaction should be reported to the "Vaccine Adverse Event Reporting System" (VAERS). Your doctor might file this report, or you can do it yourself through the VAERS web site at www.vaers.hhs.gov, or by calling 1-800-822-7967.  VAERS is only for reporting reactions. They do not give medical advice.  THE NATIONAL VACCINE INJURY COMPENSATION PROGRAM  The National Vaccine Injury Compensation Program (VICP) was created in 1986.  Persons who believe they may have been injured by a vaccine can learn about the program and about filing a claim by calling 1-800-338-2382 or visiting the VICP website at www.hrsa.gov/vaccinecompensation.  HOW CAN I LEARN MORE?  · Ask your doctor.  · Call your local or state health department.  · Contact the Centers for Disease Control and Prevention (CDC):  ¨ Call 1-800-232-4636 (1-800-CDC-INFO) or  ¨ Visit CDC's website at www.cdc.gov/vaccines  CDC PCV13 Vaccine VIS (Interim) (04/30/11)  Document Released: 12/15/2005 Document Revised: 06/14/2012 Document Reviewed: 06/09/2012  ExitCare® Patient Information ©2015 [employer]. This information is not intended to replace advice given to you by your health care provider. Make sure you discuss any questions you have with your health care provider.

## 2013-09-21 LAB — PSA, MEDICARE: PSA: 0.31 ng/mL (ref <=4.00)

## 2013-09-28 ENCOUNTER — Other Ambulatory Visit: Payer: Self-pay | Admitting: Emergency Medicine

## 2013-09-30 ENCOUNTER — Other Ambulatory Visit: Payer: Self-pay | Admitting: Physician Assistant

## 2013-10-03 ENCOUNTER — Other Ambulatory Visit: Payer: Self-pay | Admitting: Emergency Medicine

## 2013-10-07 ENCOUNTER — Other Ambulatory Visit: Payer: Self-pay | Admitting: Physician Assistant

## 2013-10-11 ENCOUNTER — Other Ambulatory Visit: Payer: Self-pay | Admitting: Emergency Medicine

## 2013-10-31 ENCOUNTER — Other Ambulatory Visit: Payer: Self-pay | Admitting: Physician Assistant

## 2013-11-02 NOTE — Telephone Encounter (Signed)
Pt has appt on 12/22/13. Dr Cleta Alberts you saw pt in July for check up, but don't see chol discussed and no lipids lab. OK to RF until his Oct appt?

## 2013-11-03 DIAGNOSIS — M1710 Unilateral primary osteoarthritis, unspecified knee: Secondary | ICD-10-CM | POA: Diagnosis not present

## 2013-11-03 DIAGNOSIS — IMO0002 Reserved for concepts with insufficient information to code with codable children: Secondary | ICD-10-CM | POA: Diagnosis not present

## 2013-11-03 DIAGNOSIS — G894 Chronic pain syndrome: Secondary | ICD-10-CM | POA: Diagnosis not present

## 2013-11-03 DIAGNOSIS — M79609 Pain in unspecified limb: Secondary | ICD-10-CM | POA: Diagnosis not present

## 2013-11-03 DIAGNOSIS — Z79899 Other long term (current) drug therapy: Secondary | ICD-10-CM | POA: Diagnosis not present

## 2013-12-01 DIAGNOSIS — Z79899 Other long term (current) drug therapy: Secondary | ICD-10-CM | POA: Diagnosis not present

## 2013-12-01 DIAGNOSIS — M5412 Radiculopathy, cervical region: Secondary | ICD-10-CM | POA: Diagnosis not present

## 2013-12-01 DIAGNOSIS — Z79891 Long term (current) use of opiate analgesic: Secondary | ICD-10-CM | POA: Diagnosis not present

## 2013-12-01 DIAGNOSIS — G894 Chronic pain syndrome: Secondary | ICD-10-CM | POA: Diagnosis not present

## 2013-12-01 DIAGNOSIS — M47812 Spondylosis without myelopathy or radiculopathy, cervical region: Secondary | ICD-10-CM | POA: Diagnosis not present

## 2013-12-03 ENCOUNTER — Other Ambulatory Visit: Payer: Self-pay | Admitting: Emergency Medicine

## 2013-12-13 ENCOUNTER — Other Ambulatory Visit: Payer: Self-pay | Admitting: Physician Assistant

## 2013-12-14 ENCOUNTER — Other Ambulatory Visit: Payer: Self-pay | Admitting: Radiology

## 2013-12-14 DIAGNOSIS — G5602 Carpal tunnel syndrome, left upper limb: Secondary | ICD-10-CM | POA: Diagnosis not present

## 2013-12-14 MED ORDER — INSULIN GLARGINE 100 UNIT/ML ~~LOC~~ SOLN: SUBCUTANEOUS | Status: DC

## 2013-12-14 MED ORDER — "INSULIN SYRINGE-NEEDLE U-100 31G X 5/16"" 1 ML MISC": Status: DC

## 2013-12-22 ENCOUNTER — Ambulatory Visit: Payer: Medicare Other | Admitting: Emergency Medicine

## 2013-12-23 ENCOUNTER — Other Ambulatory Visit: Payer: Self-pay | Admitting: Emergency Medicine

## 2013-12-29 ENCOUNTER — Ambulatory Visit: Payer: Medicare Other | Admitting: Emergency Medicine

## 2014-01-03 ENCOUNTER — Encounter (HOSPITAL_COMMUNITY): Payer: Self-pay | Admitting: *Deleted

## 2014-01-03 ENCOUNTER — Encounter: Payer: Self-pay | Admitting: Emergency Medicine

## 2014-01-03 ENCOUNTER — Emergency Department (HOSPITAL_COMMUNITY): Payer: Medicare Other

## 2014-01-03 ENCOUNTER — Ambulatory Visit (INDEPENDENT_AMBULATORY_CARE_PROVIDER_SITE_OTHER): Payer: Medicare Other | Admitting: Emergency Medicine

## 2014-01-03 ENCOUNTER — Inpatient Hospital Stay (HOSPITAL_COMMUNITY)
Admission: EM | Admit: 2014-01-03 | Discharge: 2014-01-07 | DRG: 418 | Disposition: A | Discharge status: Home Health | Payer: Medicare Other | Attending: Internal Medicine | Admitting: Internal Medicine

## 2014-01-03 VITALS — BP 139/86 | HR 103 | Temp 99.4°F | Resp 18

## 2014-01-03 DIAGNOSIS — R109 Unspecified abdominal pain: Secondary | ICD-10-CM

## 2014-01-03 DIAGNOSIS — G4733 Obstructive sleep apnea (adult) (pediatric): Secondary | ICD-10-CM | POA: Diagnosis present

## 2014-01-03 DIAGNOSIS — E1140 Type 2 diabetes mellitus with diabetic neuropathy, unspecified: Secondary | ICD-10-CM

## 2014-01-03 DIAGNOSIS — I509 Heart failure, unspecified: Secondary | ICD-10-CM | POA: Diagnosis not present

## 2014-01-03 DIAGNOSIS — K5900 Constipation, unspecified: Secondary | ICD-10-CM | POA: Diagnosis present

## 2014-01-03 DIAGNOSIS — E7800 Pure hypercholesterolemia, unspecified: Secondary | ICD-10-CM | POA: Diagnosis present

## 2014-01-03 DIAGNOSIS — K810 Acute cholecystitis: Secondary | ICD-10-CM

## 2014-01-03 DIAGNOSIS — Z79899 Other long term (current) drug therapy: Secondary | ICD-10-CM | POA: Diagnosis not present

## 2014-01-03 DIAGNOSIS — R1011 Right upper quadrant pain: Secondary | ICD-10-CM | POA: Diagnosis not present

## 2014-01-03 DIAGNOSIS — Z23 Encounter for immunization: Secondary | ICD-10-CM | POA: Diagnosis not present

## 2014-01-03 DIAGNOSIS — Z930 Tracheostomy status: Secondary | ICD-10-CM | POA: Diagnosis not present

## 2014-01-03 DIAGNOSIS — R319 Hematuria, unspecified: Secondary | ICD-10-CM

## 2014-01-03 DIAGNOSIS — E785 Hyperlipidemia, unspecified: Secondary | ICD-10-CM | POA: Diagnosis present

## 2014-01-03 DIAGNOSIS — Z6841 Body Mass Index (BMI) 40.0 and over, adult: Secondary | ICD-10-CM | POA: Diagnosis not present

## 2014-01-03 DIAGNOSIS — Z87891 Personal history of nicotine dependence: Secondary | ICD-10-CM

## 2014-01-03 DIAGNOSIS — Z9981 Dependence on supplemental oxygen: Secondary | ICD-10-CM | POA: Diagnosis not present

## 2014-01-03 DIAGNOSIS — Z794 Long term (current) use of insulin: Secondary | ICD-10-CM

## 2014-01-03 DIAGNOSIS — E1142 Type 2 diabetes mellitus with diabetic polyneuropathy: Secondary | ICD-10-CM | POA: Diagnosis present

## 2014-01-03 DIAGNOSIS — N3946 Mixed incontinence: Secondary | ICD-10-CM | POA: Diagnosis not present

## 2014-01-03 DIAGNOSIS — J449 Chronic obstructive pulmonary disease, unspecified: Secondary | ICD-10-CM | POA: Diagnosis present

## 2014-01-03 DIAGNOSIS — H9190 Unspecified hearing loss, unspecified ear: Secondary | ICD-10-CM | POA: Diagnosis present

## 2014-01-03 DIAGNOSIS — E662 Morbid (severe) obesity with alveolar hypoventilation: Secondary | ICD-10-CM | POA: Diagnosis present

## 2014-01-03 DIAGNOSIS — J9620 Acute and chronic respiratory failure, unspecified whether with hypoxia or hypercapnia: Secondary | ICD-10-CM | POA: Diagnosis not present

## 2014-01-03 DIAGNOSIS — J9610 Chronic respiratory failure, unspecified whether with hypoxia or hypercapnia: Secondary | ICD-10-CM | POA: Diagnosis present

## 2014-01-03 DIAGNOSIS — I5030 Unspecified diastolic (congestive) heart failure: Secondary | ICD-10-CM | POA: Diagnosis not present

## 2014-01-03 DIAGNOSIS — I10 Essential (primary) hypertension: Secondary | ICD-10-CM | POA: Diagnosis present

## 2014-01-03 DIAGNOSIS — K8000 Calculus of gallbladder with acute cholecystitis without obstruction: Principal | ICD-10-CM | POA: Diagnosis present

## 2014-01-03 DIAGNOSIS — K8020 Calculus of gallbladder without cholecystitis without obstruction: Secondary | ICD-10-CM | POA: Diagnosis not present

## 2014-01-03 DIAGNOSIS — K7689 Other specified diseases of liver: Secondary | ICD-10-CM | POA: Diagnosis not present

## 2014-01-03 DIAGNOSIS — E1149 Type 2 diabetes mellitus with other diabetic neurological complication: Secondary | ICD-10-CM

## 2014-01-03 DIAGNOSIS — I5032 Chronic diastolic (congestive) heart failure: Secondary | ICD-10-CM | POA: Diagnosis present

## 2014-01-03 DIAGNOSIS — K819 Cholecystitis, unspecified: Secondary | ICD-10-CM | POA: Diagnosis not present

## 2014-01-03 DIAGNOSIS — K5909 Other constipation: Secondary | ICD-10-CM | POA: Diagnosis present

## 2014-01-03 LAB — URINALYSIS, ROUTINE W REFLEX MICROSCOPIC
Bilirubin Urine: NEGATIVE
GLUCOSE, UA: NEGATIVE mg/dL
KETONES UR: NEGATIVE mg/dL
Leukocytes, UA: NEGATIVE
Nitrite: NEGATIVE
Protein, ur: >300 mg/dL — AB
Specific Gravity, Urine: 1.019 (ref 1.005–1.030)
Urobilinogen, UA: 1 mg/dL (ref 0.0–1.0)
pH: 6 (ref 5.0–8.0)

## 2014-01-03 LAB — URINE MICROSCOPIC-ADD ON

## 2014-01-03 LAB — COMPREHENSIVE METABOLIC PANEL
ALBUMIN: 2.8 g/dL — AB (ref 3.5–5.2)
ALK PHOS: 86 U/L (ref 39–117)
ALT: 18 U/L (ref 0–53)
AST: 17 U/L (ref 0–37)
Anion gap: 11 (ref 5–15)
BILIRUBIN TOTAL: 0.3 mg/dL (ref 0.3–1.2)
BUN: 17 mg/dL (ref 6–23)
CHLORIDE: 92 meq/L — AB (ref 96–112)
CO2: 30 mEq/L (ref 19–32)
Calcium: 9.5 mg/dL (ref 8.4–10.5)
Creatinine, Ser: 0.71 mg/dL (ref 0.50–1.35)
GFR calc Af Amer: >90 mL/min (ref >90)
GFR calc non Af Amer: >90 mL/min (ref >90)
Glucose, Bld: 100 mg/dL — ABNORMAL HIGH (ref 70–99)
POTASSIUM: 4.4 meq/L (ref 3.7–5.3)
SODIUM: 133 meq/L — AB (ref 137–147)
Total Protein: 7.7 g/dL (ref 6.0–8.3)

## 2014-01-03 LAB — POCT CBC
GRANULOCYTE PERCENT: 11.7 % — AB (ref 37–80)
HCT, POC: 55.4 % — AB (ref 43.5–53.7)
Hemoglobin: 17.5 g/dL (ref 14.1–18.1)
Lymph, poc: 2.5 (ref 0.6–3.4)
MCH, POC: 26.8 pg — AB (ref 27–31.2)
MCHC: 31.6 g/dL — AB (ref 31.8–35.4)
MCV: 84.7 fL (ref 80–97)
MID (CBC): 0.2 (ref 0–0.9)
MPV: 8.1 fL (ref 0–99.8)
PLATELET COUNT, POC: 247 10*3/uL (ref 142–424)
POC GRANULOCYTE: 81.5 — AB (ref 2–6.9)
POC LYMPH PERCENT: 17.1 %L (ref 10–50)
POC MID %: 1.4 % (ref 0–12)
RBC: 6.54 M/uL — AB (ref 4.69–6.13)
RDW, POC: 17.6 %
WBC: 14.4 10*3/uL — AB (ref 4.6–10.2)

## 2014-01-03 LAB — POCT URINALYSIS DIPSTICK
Bilirubin, UA: NEGATIVE
Glucose, UA: NEGATIVE
Ketones, UA: NEGATIVE
Leukocytes, UA: NEGATIVE
Nitrite, UA: NEGATIVE
PH UA: 6
PROTEIN UA: 300
Spec Grav, UA: 1.02
Urobilinogen, UA: 2

## 2014-01-03 LAB — CBC WITH DIFFERENTIAL/PLATELET
BASOS ABS: 0 10*3/uL (ref 0.0–0.1)
BASOS PCT: 0 % (ref 0–1)
Eosinophils Absolute: 0.1 10*3/uL (ref 0.0–0.7)
Eosinophils Relative: 1 % (ref 0–5)
HCT: 52.8 % — ABNORMAL HIGH (ref 39.0–52.0)
HEMOGLOBIN: 17.2 g/dL — AB (ref 13.0–17.0)
Lymphocytes Relative: 14 % (ref 12–46)
Lymphs Abs: 1.7 10*3/uL (ref 0.7–4.0)
MCH: 27.3 pg (ref 26.0–34.0)
MCHC: 32.6 g/dL (ref 30.0–36.0)
MCV: 83.9 fL (ref 78.0–100.0)
Monocytes Absolute: 0.7 10*3/uL (ref 0.1–1.0)
Monocytes Relative: 5 % (ref 3–12)
NEUTROS ABS: 10 10*3/uL — AB (ref 1.7–7.7)
NEUTROS PCT: 80 % — AB (ref 43–77)
PLATELETS: 180 10*3/uL (ref 150–400)
RBC: 6.29 MIL/uL — ABNORMAL HIGH (ref 4.22–5.81)
RDW: 16.1 % — ABNORMAL HIGH (ref 11.5–15.5)
WBC: 12.4 10*3/uL — ABNORMAL HIGH (ref 4.0–10.5)

## 2014-01-03 LAB — GLUCOSE, POCT (MANUAL RESULT ENTRY)
POC Glucose: 57 mg/dl — AB (ref 70–99)
POC Glucose: 112 mg/dl — AB (ref 70–99)

## 2014-01-03 LAB — POCT GLYCOSYLATED HEMOGLOBIN (HGB A1C): HEMOGLOBIN A1C: 9.1

## 2014-01-03 LAB — LIPASE, BLOOD: Lipase: 35 U/L (ref 11–59)

## 2014-01-03 LAB — CBG MONITORING, ED: GLUCOSE-CAPILLARY: 79 mg/dL (ref 70–99)

## 2014-01-03 MED ORDER — SODIUM CHLORIDE 0.9 % IV BOLUS (SEPSIS)
500.0000 mL | Freq: Once | INTRAVENOUS | Status: AC
  Administered 2014-01-03: 500 mL via INTRAVENOUS

## 2014-01-03 MED ORDER — ALBUTEROL SULFATE (2.5 MG/3ML) 0.083% IN NEBU
2.5000 mg | INHALATION_SOLUTION | Freq: Once | RESPIRATORY_TRACT | Status: AC
  Administered 2014-01-03: 2.5 mg via RESPIRATORY_TRACT
  Filled 2014-01-03: qty 3

## 2014-01-03 MED ORDER — IOHEXOL 300 MG/ML  SOLN
100.0000 mL | Freq: Once | INTRAMUSCULAR | Status: AC | PRN
  Administered 2014-01-03: 100 mL via INTRAVENOUS

## 2014-01-03 NOTE — ED Notes (Signed)
Bed: [id number] Expected date:  Expected time:  Means of arrival:  Comments: Ems- abdominal pain

## 2014-01-03 NOTE — ED Notes (Signed)
[facility]  Pt here [facility] he was at doctor today and was noted to have elevated white count,  Pt has a trach that has been in place for 15 years and some SOB also,  Pt is alert and oriented,  Respiratory at bedside

## 2014-01-03 NOTE — ED Provider Notes (Signed)
CSN: [id number]     Arrival date & time 01/03/14  1628 History   First MD Initiated Contact with Patient 01/03/14 1651     Chief Complaint  Patient presents with  . Constipation     (Consider location/radiation/quality/duration/timing/severity/associated sxs/prior Treatment) The history is provided by the patient. No language interpreter was used.  Garin Mata is a 58 year old male with past medical history of CHF, bronchitis, diabetes, hypertension, hypercholesterolemia, COPD, sleep apnea presenting to the emergency department reporting that "I'm backed up." Patient reported that he has not had a normal bowel movement and approximate 4-5 days, reported that he had a partial bowel movement today at approximately 1:00 PM-reported that he does not know whether or not he needs to strain. Patient reported that he was seen and assessed by his primary care provider, Dr. Cleta Alberts who recommended patient to come to the emergency department secondary to constipation, elevated white blood cell count, history of diabetes and history of SBO approximate 2 years ago. Patient reported that he has been experiencing abdominal pain for the past week-reported that the pain starts high and has now went low. Denied nausea, vomiting, diarrhea, issues with passing of gas, fever, issues with urination, chest pain, shortness of breath, difficulty breathing. PCP Dr. Cleta Alberts  Past Medical History  Diagnosis Date  . Bronchitis   . CHF (congestive heart failure)   . DM type 2 with diabetic peripheral neuropathy   . Hypertension   . Hypercholesteremia   . Hearing loss   . Peripheral neuropathy   . Sleep apnea     Severe sleep apnea requiring tracheostomy  . COPD (chronic obstructive pulmonary disease)    Past Surgical History  Procedure Laterality Date  . Tracheostomy    . Appendectomy    . Hand surgery    . Left femor surgery    . Multiple extractions with alveoloplasty N/A 05/11/2012    Procedure: MULTIPLE EXTRACTION  WITH ALVEOLOPLASTY;  Surgeon: [name], DDS;  Location: [facility] OR;  Service: Oral Surgery;  Laterality: N/A;   Family History  Problem Relation Age of Onset  . Coronary artery disease Father    History  Substance Use Topics  . Smoking status: Former Smoker -- 2.00 packs/day for 30 years    Types: Cigarettes    Quit date: 07/02/1999  . Smokeless tobacco: Never Used  . Alcohol Use: No    Review of Systems  Constitutional: Negative for fever and chills.  Respiratory: Negative for cough, chest tightness and shortness of breath.   Cardiovascular: Negative for chest pain.  Gastrointestinal: Positive for abdominal pain and constipation. Negative for nausea, vomiting, blood in stool and anal bleeding.  Genitourinary: Negative for dysuria, urgency and hematuria.      Allergies  Review of patient's allergies indicates no known allergies.  Home Medications   Prior to Admission medications   Medication Sig Start Date End Date Taking? Authorizing Provider  albuterol (PROVENTIL HFA;VENTOLIN HFA) 108 (90 BASE) MCG/ACT inhaler Inhale 2 puffs into the lungs every 6 (six) hours as needed for wheezing or shortness of breath (shortness of breath).   Yes Historical Provider, MD  albuterol (PROVENTIL) (2.5 MG/3ML) 0.083% nebulizer solution Take 3 mLs (2.5 mg total) by nebulization 4 (four) times daily. 06/29/12  Yes Storm Frisk, MD  atorvastatin (LIPITOR) 10 MG tablet Take 10 mg by mouth daily.   Yes Historical Provider, MD  Fluticasone-Salmeterol (ADVAIR) 500-50 MCG/DOSE AEPB Inhale 1 puff into the lungs 2 (two) times daily.   Yes  Historical Provider, MD  furosemide (LASIX) 40 MG tablet Take 40 mg by mouth daily.   Yes Historical Provider, MD  gabapentin (NEURONTIN) 300 MG capsule Take 3 to 4 tablets at night for pain as needed Patient taking differently: Take 1,200 mg by mouth daily as needed (pain). Take 4 tablets by mouth daily at night as needed for pain. 06/21/13  Yes Collene Gobble, MD   GRALISE 600 MG TABS Take 3 tablets by mouth every morning.   Yes Historical Provider, MD  insulin aspart (NOVOLOG) 100 UNIT/ML injection Inject 21 Units into the skin 3 (three) times daily before meals.    Yes Historical Provider, MD  insulin glargine (LANTUS) 100 UNIT/ML injection Inject 90 Units into the skin daily.   Yes Historical Provider, MD  Insulin Syringe-Needle U-100 (B-D INS SYR ULTRAFINE 1CC/31G) 31G X 5/16" 1 ML MISC Use as Directed 12/14/13  Yes Collene Gobble, MD  lisinopril (PRINIVIL,ZESTRIL) 5 MG tablet Take 5 mg by mouth daily.   Yes Historical Provider, MD  oxyCODONE-acetaminophen (PERCOCET) 10-325 MG per tablet Take 1 tablet by mouth every 6 (six) hours as needed for pain (pain).    Yes Historical Provider, MD  polyethylene glycol (MIRALAX / GLYCOLAX) packet Take 17 g by mouth daily.   Yes Historical Provider, MD  spironolactone (ALDACTONE) 25 MG tablet Take 25 mg by mouth daily.   Yes Historical Provider, MD  tiotropium (SPIRIVA) 18 MCG inhalation capsule Place 18 mcg into inhaler and inhale daily.   Yes Historical Provider, MD  traMADol (ULTRAM) 50 MG tablet Take 100 mg by mouth every 6 (six) hours as needed for severe pain (pain).    Yes Historical Provider, MD  ADVAIR DISKUS 500-50 MCG/DOSE AEPB INHALE 1 PUFF INTO THE LUNGS TWICE DAILY    Collene Gobble, MD  atorvastatin (LIPITOR) 10 MG tablet TAKE ONE TABLET BY MOUTH DAILY 11/02/13   Collene Gobble, MD  furosemide (LASIX) 40 MG tablet TAKE 1 TABLET BY MOUTH TWICE DAILY    Collene Gobble, MD  insulin glargine (LANTUS) 100 UNIT/ML injection INJECT 90 UNITS UNDER THE SKIN EVERY DAY AS DIRECTED 12/14/13   Collene Gobble, MD  lisinopril (PRINIVIL,ZESTRIL) 5 MG tablet TAKE 1 TABLET BY MOUTH DAILY 12/05/13   Collene Gobble, MD  NOVOLIN R 100 UNIT/ML injection USE 21 UNITS FOUR TIMES DAILY BEFORE MEALS PER SLIDING SCALE    Collene Gobble, MD  polyethylene glycol powder (GLYCOLAX/MIRALAX) powder MIX 17 GRAMS(ONE CAPFUL) IN LIQUID OF CHOICE  AND DRINK DAILY    Collene Gobble, MD  PROAIR HFA 108 (90 BASE) MCG/ACT inhaler INHALE 2 PUFFS EVERY 4 TO 6 HOURS AS NEEDED 10/03/13   Collene Gobble, MD  spironolactone (ALDACTONE) 25 MG tablet TAKE 1 TABLET BY MOUTH EVERY DAY 10/12/13   Collene Gobble, MD  tiotropium (SPIRIVA HANDIHALER) 18 MCG inhalation capsule INHALE 1 CAPSULE IN HANDIHALER EVERY DAY AS DIRECTED 12/23/13   Morrell Riddle, PA-C  VOLTAREN 1 % GEL Apply 2 g topically daily as needed (hand pain).     Historical Provider, MD   BP 143/83 mmHg  Pulse 89  Temp(Src) 98.9 F (37.2 C) (Oral)  Resp 20  SpO2 100% Physical Exam  Constitutional: He is oriented to person, place, and time. He appears well-developed and well-nourished. No distress.  HENT:  Head: Normocephalic and atraumatic.  Eyes: Conjunctivae and EOM are normal. Right eye exhibits no discharge. Left eye exhibits no discharge.  Neck: Normal  range of motion. Neck supple.  Cardiovascular: Normal rate, regular rhythm and normal heart sounds.  Exam reveals no friction rub.   No murmur heard. Pulmonary/Chest: Breath sounds normal. No respiratory distress. He has no wheezes. He has no rales.  Trach placed without complication  Abdominal: Soft. He exhibits no distension. There is tenderness in the right lower quadrant, suprapubic area and left lower quadrant. There is no rebound and no guarding.  Obese Bowel sounds normal active and upper quadrants of the abdomen, hypoactive bowel sounds noted to the lower aspects of the abdomen Discomfort on palpation to the right lower quadrant, suprapubic and left lower quadrants of the abdomen Negative peritoneal signs   Musculoskeletal: Normal range of motion.  Neurological: He is alert and oriented to person, place, and time. No cranial nerve deficit. He exhibits normal muscle tone. Coordination normal.  Skin: Skin is warm and dry. No rash noted. He is not diaphoretic. No erythema.  Psychiatric: He has a normal mood and affect. His  behavior is normal. Thought content normal.  Nursing note and vitals reviewed.   ED Course  Procedures (including critical care time)  Results for orders placed or performed during the hospital encounter of 01/03/14  CBC with Differential  Result Value Ref Range   WBC 12.4 (H) 4.0 - 10.5 K/uL   RBC 6.29 (H) 4.22 - 5.81 MIL/uL   Hemoglobin 17.2 (H) 13.0 - 17.0 g/dL   HCT 62.152.8 (H) 30.839.0 - 65.752.0 %   MCV 83.9 78.0 - 100.0 fL   MCH 27.3 26.0 - 34.0 pg   MCHC 32.6 30.0 - 36.0 g/dL   RDW 84.616.1 (H) 96.211.5 - 95.215.5 %   Platelets 180 150 - 400 K/uL   Neutrophils Relative % 80 (H) 43 - 77 %   Neutro Abs 10.0 (H) 1.7 - 7.7 K/uL   Lymphocytes Relative 14 12 - 46 %   Lymphs Abs 1.7 0.7 - 4.0 K/uL   Monocytes Relative 5 3 - 12 %   Monocytes Absolute 0.7 0.1 - 1.0 K/uL   Eosinophils Relative 1 0 - 5 %   Eosinophils Absolute 0.1 0.0 - 0.7 K/uL   Basophils Relative 0 0 - 1 %   Basophils Absolute 0.0 0.0 - 0.1 K/uL  Comprehensive metabolic panel  Result Value Ref Range   Sodium 133 (L) 137 - 147 mEq/L   Potassium 4.4 3.7 - 5.3 mEq/L   Chloride 92 (L) 96 - 112 mEq/L   CO2 30 19 - 32 mEq/L   Glucose, Bld 100 (H) 70 - 99 mg/dL   BUN 17 6 - 23 mg/dL   Creatinine, Ser 8.410.71 0.50 - 1.35 mg/dL   Calcium 9.5 8.4 - 32.410.5 mg/dL   Total Protein 7.7 6.0 - 8.3 g/dL   Albumin 2.8 (L) 3.5 - 5.2 g/dL   AST 17 0 - 37 U/L   ALT 18 0 - 53 U/L   Alkaline Phosphatase 86 39 - 117 U/L   Total Bilirubin 0.3 0.3 - 1.2 mg/dL   GFR calc non Af Amer >90 >90 mL/min   GFR calc Af Amer >90 >90 mL/min   Anion gap 11 5 - 15  Urinalysis, Routine w reflex microscopic  Result Value Ref Range   Color, Urine YELLOW YELLOW   APPearance CLEAR CLEAR   Specific Gravity, Urine 1.019 1.005 - 1.030   pH 6.0 5.0 - 8.0   Glucose, UA NEGATIVE NEGATIVE mg/dL   Hgb urine dipstick TRACE (A) NEGATIVE   Bilirubin  Urine NEGATIVE NEGATIVE   Ketones, ur NEGATIVE NEGATIVE mg/dL   Protein, ur >161 (A) NEGATIVE mg/dL   Urobilinogen, UA 1.0 0.0  - 1.0 mg/dL   Nitrite NEGATIVE NEGATIVE   Leukocytes, UA NEGATIVE NEGATIVE  Lipase, blood  Result Value Ref Range   Lipase 35 11 - 59 U/L  Urine microscopic-add on  Result Value Ref Range   Squamous Epithelial / LPF RARE RARE   WBC, UA 0-2 <3 WBC/hpf   RBC / HPF 0-2 <3 RBC/hpf   Bacteria, UA RARE RARE   Casts HYALINE CASTS (A) NEGATIVE  POC CBG, ED  Result Value Ref Range   Glucose-Capillary 79 70 - 99 mg/dL    Labs Review Labs Reviewed  CBC WITH DIFFERENTIAL - Abnormal; Notable for the following:    WBC 12.4 (*)    RBC 6.29 (*)    Hemoglobin 17.2 (*)    HCT 52.8 (*)    RDW 16.1 (*)    Neutrophils Relative % 80 (*)    Neutro Abs 10.0 (*)    All other components within normal limits  COMPREHENSIVE METABOLIC PANEL - Abnormal; Notable for the following:    Sodium 133 (*)    Chloride 92 (*)    Glucose, Bld 100 (*)    Albumin 2.8 (*)    All other components within normal limits  URINALYSIS, ROUTINE W REFLEX MICROSCOPIC - Abnormal; Notable for the following:    Hgb urine dipstick TRACE (*)    Protein, ur >300 (*)    All other components within normal limits  URINE MICROSCOPIC-ADD ON - Abnormal; Notable for the following:    Casts HYALINE CASTS (*)    All other components within normal limits  LIPASE, BLOOD  CBG MONITORING, ED    Imaging Review Ct Abdomen Pelvis W Contrast  01/03/2014   CLINICAL DATA:  58 year old male with right upper quadrant abdominal pain for the past 4 days, with some associated shortness of breath.  EXAM: CT ABDOMEN AND PELVIS WITH CONTRAST  TECHNIQUE: Multidetector CT imaging of the abdomen and pelvis was performed using the standard protocol following bolus administration of intravenous contrast.  CONTRAST:  OMNIPAQUE IOHEXOL 300 MG/ML  SOLN  COMPARISON:  CT of the abdomen and pelvis 03/09/2011.  FINDINGS: Lower chest:  Mild cardiomegaly.  Mild bibasilar scarring.  Hepatobiliary: Large amount of amorphous high attenuation material layering  dependently in the gallbladder, presumably biliary sludge or noncalcified gallstones. Gallbladder is moderately distended. However, the gallbladder wall does not appear thickened, and there is no pericholecystic fluid or inflammatory changes. Diffuse decreased attenuation throughout the hepatic parenchyma, compatible with hepatic steatosis. No focal cystic or solid hepatic lesions. No intra or extrahepatic biliary ductal dilatation.  Pancreas: Very mild haziness adjacent to the pancreatic head. The appearance of the pancreas is otherwise unremarkable.  Spleen: Unremarkable.  Adrenals/Urinary Tract: Bilateral adrenal glands are normal in appearance. Bilateral kidneys are also normal in appearance mild bilateral perinephric stranding (nonspecific). No hydroureteronephrosis to indicate urinary tract obstruction at this time. Urinary bladder is unremarkable in appearance.  Stomach/Bowel: The stomach is normal in appearance. No pathologic dilatation of small bowel or colon.  Vascular/Lymphatic: Extensive atherosclerosis throughout the abdominal and pelvic vasculature, without evidence of aneurysm or dissection. No pathologically enlarged lymph nodes are noted in the abdomen or pelvis.  Reproductive: Prostate gland is unremarkable in appearance.  Other: No significant volume of ascites.  No pneumoperitoneum.  Musculoskeletal: There are no aggressive appearing lytic or blastic lesions noted in the  visualized portions of the skeleton. Status post ORIF in the left femoral neck.  IMPRESSION: 1. Large amount of biliary sludge and/or small noncalcified gallstones lying dependently in the gallbladder. No definite findings to suggest acute cholecystitis at this time. 2. There is very mild haziness in the peripancreatic fat adjacent to the pancreatic head. The possibility of mild pancreatitis warrants consideration, and correlation with lipase levels is recommended. 3. Hepatic steatosis. 4. Atherosclerosis. 5. Additional  incidental findings, as above.   Electronically Signed   By: Trudie Reed M.D.   On: 01/03/2014 20:06   US Abdomen Limited Ruq  01/03/2014   CLINICAL DATA:  Abdominal pain and constipation.  EXAM: US ABDOMEN LIMITED - RIGHT UPPER QUADRANT  COMPARISON:  None.  FINDINGS: Gallbladder:  Cholelithiasis with sludge and small stones layering in the gallbladder. No gallbladder wall thickening. Murphy's sign is positive. Changes are compatible with acute cholecystitis in the appropriate clinical setting.  Common bile duct:  Diameter: Not visualized due to body habitus and bowel gas. No intrahepatic bile duct dilatation.  Liver:  Diffusely heterogeneous liver parenchymal echotexture likely representing diffuse fatty infiltration. Portions of the liver are not visualized due to body habitus and shadowing.  IMPRESSION: Cholelithiasis with sludge and stones in the gallbladder and positive Murphy's sign. Changes are compatible with acute cholecystitis in the appropriate clinical setting.   Electronically Signed   By: Burman Nieves M.D.   On: 01/03/2014 23:55     EKG Interpretation None      1:33 AM This provider spoke with Dr. Michaell Cowing, Surgeon - discussed case, labs, imaging, vitals, ED course in great detail. Recommended patient to be admitted to Internal Medicine and surgery will consult in the morning.   1:58 PM This provider spoke with Dr. Clyde Lundborg, [facility] - discussed case, labs, imaging, vitals, CCS recommendations. Patient to be admitted to MedSurg.   MDM   Final diagnoses:  Constipation  Abdominal pain  Acute cholecystitis    Medications  sodium chloride 0.9 % bolus 500 mL (500 mLs Intravenous New Bag/Given 01/03/14 2013)  iohexol (OMNIPAQUE) 300 MG/ML solution 100 mL (100 mLs Intravenous Contrast Given 01/03/14 1929)  albuterol (PROVENTIL) (2.5 MG/3ML) 0.083% nebulizer solution 2.5 mg (2.5 mg Nebulization Given 01/03/14 2000)    Filed Vitals:   01/03/14 1814 01/03/14 2004 01/03/14 2105  01/03/14 2337  BP: 155/88  119/72 143/83  Pulse: 104  89 89  Temp:      TempSrc:      Resp: 19  20 20   SpO2: 98% 92% 99% 100%   CBC noted elevated white blood cell count of 12.4 with a left shift of neutrophils at 10.0. Hemoglobin 17.2, hematocrit 52.8. CMP unremarkable. Glucose 100, negative elevated anion gap-11.0 mEq per liter. Lipase negative elevation. Urinalysis noted trace hemoglobin negative for nitrites or leukocytes.CT abdomen and pelvis with contrast noted large amount of biliary sludge and/or small noncalcified gallstones lying dependently in the gallbladder - no definite findings of acute cholecystitis. There is mild haziness in the peripancreatic fat suggesting mild pancreatitis. Right upper quadrant ultrasound identified cholelithiasis with sludge and stones in the gallbladder with positive Murphy sign consistent with acute cholecystitis. This provider spoke with Dr. Michaell Cowing, [facility] surgeon who recommended patient to be admitted to internal medicine hospitalists and that surgical consult in the morning-reported the patient will most likely have cholecystectomy performed tomorrow. Patient started on IV antibiotics. This provider spoke with [facility] - patient to be admitted to MedSurg. Patient understood and agreed to  plan of care - patient stable for transfer.   Raymon MuttonMarissa Lijah Bourque, PA-C 01/04/14 [phone]

## 2014-01-03 NOTE — Progress Notes (Deleted)
Subjective:  This chart was scribed for Tyrone ChrisSteven Daub, MD by Carl Bestelina Holson, Medical Scribe. This patient was seen in Room 22 and the patient's care was started at 2:19 PM.   Patient ID: Tyrone Cunningham, male    DOB: 10/24/1955, 58 y.o.   MRN: [id number]  HPI HPI Comments: Tyrone Cunningham is a 58 y.o. male who presents to the [facility] for a DM follow-up and medication refill.  He has been checking his sugars but is not sure if they are accurate.    He is also complaining of constant abdominal pain with associated constipation that started 10 days ago.  He states that yesterday his pain decreased and today he is only experiencing intermittent pain.  He had a small bowel movement this morning.  He denies vomiting and fever as associated symptoms.     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   OSA (obstructive sleep apnea) 10/16/2011   [name] with frequent exacerbations    CHF (congestive heart failure)    Hypertension    Hypercholesteremia    Hearing loss    Peripheral neuropathy    Past Medical History  Diagnosis Date   Bronchitis    CHF (congestive heart failure)    DM type 2 with diabetic peripheral neuropathy    Hypertension    Hypercholesteremia    Hearing loss    Peripheral neuropathy    Sleep apnea     Severe sleep apnea requiring tracheostomy   COPD (chronic obstructive pulmonary disease)    Past Surgical History  Procedure Laterality Date   Tracheostomy     Appendectomy     Hand surgery     Left femor surgery     Multiple extractions with alveoloplasty N/A 05/11/2012    Procedure: MULTIPLE EXTRACTION WITH ALVEOLOPLASTY;  Surgeon: Georgia LopesScott M Jensen, DDS;  Location: [facility] OR;  Service: Oral Surgery;  Laterality: N/A;   No Known  Allergies Prior to Admission medications   Medication Sig Start Date End Date Taking? Authorizing Provider  ADVAIR DISKUS 500-50 MCG/DOSE AEPB INHALE 1 PUFF INTO THE LUNGS TWICE DAILY    Collene GobbleSteven A Daub, MD  albuterol (PROVENTIL) (2.5 MG/3ML) 0.083% nebulizer solution Take 3 mLs (2.5 mg total) by nebulization 4 (four) times daily. 06/29/12   Storm FriskPatrick E Wright, MD  atorvastatin (LIPITOR) 10 MG tablet TAKE ONE TABLET BY MOUTH DAILY 11/02/13   Collene GobbleSteven A Daub, MD  furosemide (LASIX) 40 MG tablet TAKE 1 TABLET BY MOUTH TWICE DAILY    Collene GobbleSteven A Daub, MD  gabapentin (NEURONTIN) 300 MG capsule Take 3 to 4 tablets at night for pain as needed 06/21/13   Collene GobbleSteven A Daub, MD  GRALISE 600 MG TABS Take 3 tablets by mouth every morning.    Historical Provider, MD  insulin glargine (LANTUS) 100 UNIT/ML injection INJECT 90 UNITS UNDER THE SKIN EVERY DAY AS DIRECTED 12/14/13   Collene GobbleSteven A Daub, MD  Insulin Syringe-Needle U-100 (B-D INS SYR ULTRAFINE 1CC/31G) 31G X 5/16" 1 ML MISC Use as Directed 12/14/13   Collene GobbleSteven A Daub, MD  lisinopril (PRINIVIL,ZESTRIL) 5 MG tablet TAKE 1 TABLET BY MOUTH DAILY 12/05/13   Collene GobbleSteven A Daub, MD  NOVOLIN R 100 UNIT/ML injection USE 21 UNITS FOUR TIMES DAILY BEFORE MEALS PER SLIDING SCALE  Collene Gobble, MD  oxyCODONE-acetaminophen (PERCOCET) 10-325 MG per tablet Take 1 tablet by mouth every 6 (six) hours as needed for pain.    Historical Provider, MD  polyethylene glycol powder (GLYCOLAX/MIRALAX) powder MIX 17 GRAMS(ONE CAPFUL) IN LIQUID OF CHOICE AND DRINK DAILY    Collene Gobble, MD  PROAIR HFA 108 (90 BASE) MCG/ACT inhaler INHALE 2 PUFFS EVERY 4 TO 6 HOURS AS NEEDED 10/03/13   Collene Gobble, MD  spironolactone (ALDACTONE) 25 MG tablet TAKE 1 TABLET BY MOUTH EVERY DAY 10/12/13   Collene Gobble, MD  tiotropium (SPIRIVA HANDIHALER) 18 MCG inhalation capsule INHALE 1 CAPSULE IN HANDIHALER EVERY DAY AS DIRECTED 12/23/13   Morrell Riddle, PA-C  traMADol (ULTRAM) 50 MG tablet Take 100 mg by mouth every 6 (six)  hours as needed for pain.    Historical Provider, MD  VOLTAREN 1 % GEL as needed.    Historical Provider, MD   History   Social History   Marital Status: Single    Spouse Name: N/A    Number of Children: N/A   Years of Education: N/A   Occupational History   Not on file.   Social History Main Topics   Smoking status: Former Smoker -- 2.00 packs/day for 30 years    Types: Cigarettes    Quit date: 07/02/1999   Smokeless tobacco: Never Used   Alcohol Use: No   Drug Use: No   Sexual Activity: No   Other Topics Concern   Not on file   Social History Narrative   No narrative on file     Review of Systems  Constitutional: Negative for fever.  Gastrointestinal: Positive for abdominal pain and constipation. Negative for vomiting.      Objective:  Physical Exam  Constitutional: He is oriented to person, place, and time. He appears well-nourished. No distress.  He has a tracheostomy.  Morbidly obese.  HENT:  Head: Normocephalic and atraumatic.  Eyes: EOM are normal.  Neck: Normal range of motion.  Cardiovascular: Normal rate, regular rhythm and normal heart sounds.   No murmur heard. Pulmonary/Chest: Effort normal. No respiratory distress. He has decreased breath sounds (both bases). He has no wheezes. He has no rales.  Abdominal: Soft. There is no tenderness.  Genitourinary: Rectum normal and prostate normal.  Brown stool.  Not impacted.  Musculoskeletal: Normal range of motion.  Severe stasis changes of both legs.  Poor foot hygiene.   Neurological: He is alert and oriented to person, place, and time.  Skin: Skin is warm and dry. He is not diaphoretic.  Psychiatric: He has a normal mood and affect. His behavior is normal.  Nursing note and vitals reviewed.  Results for orders placed or performed in visit on 01/03/14  POCT CBC  Result Value Ref Range   WBC 14.4 (A) 4.6 - 10.2 K/uL   Lymph, poc 2.5 0.6 - 3.4   POC LYMPH PERCENT 17.1 10 - 50 %L   MID  (cbc) 0.2 0 - 0.9   POC MID % 1.4 0 - 12 %M   POC Granulocyte 81.5 (A) 2 - 6.9   Granulocyte percent 11.7 (A) 37 - 80 %G   RBC 6.54 (A) 4.69 - 6.13 M/uL   Hemoglobin 17.5 14.1 - 18.1 g/dL   HCT, POC 14.7 (A) 82.9 - 53.7 %   MCV 84.7 80 - 97 fL   MCH, POC 26.8 (A) 27 - 31.2 pg   MCHC 31.6 (A) 31.8 - 35.4 g/dL  RDW, POC 17.6 %   Platelet Count, POC 247 142 - 424 K/uL   MPV 8.1 0 - 99.8 fL  POCT glucose (manual entry)  Result Value Ref Range   POC Glucose 57 (A) 70 - 99 mg/dl  POCT glycosylated hemoglobin (Hb A1C)  Result Value Ref Range   Hemoglobin A1C 9.1      BP 139/86 mmHg   Pulse 103   Temp(Src) 99.4 F (37.4 C) (Oral)   Resp 18   SpO2 91% Assessment & Plan:  Suspect abdominal pain is secondary to constipation related to his use of opiates.  Will check a CBC and UA.  If these are unremarkable, will treat as constipation for now.  Recheck if worsening pain.   I personally performed the services described in this documentation, which was scribed in my presence. The recorded information has been reviewed and is accurate.

## 2014-01-03 NOTE — Progress Notes (Addendum)
Subjective:  This chart was scribed for [name], MD by Carl Best, Medical Scribe. This patient was seen in Room 21 and the patient's care was started at 2:19 PM.   Patient ID: Tyrone Cunningham, male    DOB: 1955/04/25, 58 y.o.   MRN: [id number]  HPI HPI Comments: Tyrone Cunningham is a 58 y.o. male who presents to the [facility] for a DM follow-up and medication refill. He has been checking his sugars but is not sure if they are accurate.   He is also complaining of constant abdominal pain with associated constipation that started 10 days ago. He states that yesterday his pain decreased and today he is only experiencing intermittent pain. He had a small bowel movement this morning. He denies vomiting and fever as associated symptoms.   Patient Active Problem List   Diagnosis Date Noted  . Chronic respiratory failure 01/06/2013  . Edema 03/16/2012  . Obesity 10/20/2011  . DM (diabetes mellitus), type 2 with neurological complications 10/17/2011  . 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 OSA (obstructive sleep apnea) 10/16/2011  . [name] with frequent exacerbations   . CHF (congestive heart failure)   . Hypertension   . Hypercholesteremia   . Hearing loss   . Peripheral neuropathy    Past Medical History  Diagnosis Date  . Bronchitis   . CHF (congestive heart failure)   . DM type 2 with diabetic peripheral neuropathy   . Hypertension   . Hypercholesteremia   . Hearing loss   . Peripheral neuropathy   . Sleep apnea     Severe sleep apnea requiring tracheostomy  . COPD (chronic obstructive pulmonary disease)    Past Surgical History  Procedure Laterality Date  . Tracheostomy    . Appendectomy    . Hand surgery    . Left femor surgery    . Multiple extractions with alveoloplasty N/A 05/11/2012    Procedure: MULTIPLE EXTRACTION WITH ALVEOLOPLASTY;  Surgeon: [name], DDS;  Location: [facility] OR;  Service: Oral Surgery;  Laterality: N/A;   No Known  Allergies Prior to Admission medications   Medication Sig Start Date End Date Taking? Authorizing Provider  ADVAIR DISKUS 500-50 MCG/DOSE AEPB INHALE 1 PUFF INTO THE LUNGS TWICE DAILY   Yes Collene Gobble, MD  albuterol (PROVENTIL) (2.5 MG/3ML) 0.083% nebulizer solution Take 3 mLs (2.5 mg total) by nebulization 4 (four) times daily. 06/29/12  Yes Storm Frisk, MD  atorvastatin (LIPITOR) 10 MG tablet TAKE ONE TABLET BY MOUTH DAILY 11/02/13  Yes Collene Gobble, MD  furosemide (LASIX) 40 MG tablet TAKE 1 TABLET BY MOUTH TWICE DAILY   Yes Collene Gobble, MD  gabapentin (NEURONTIN) 300 MG capsule Take 3 to 4 tablets at night for pain as needed 06/21/13  Yes Collene Gobble, MD  GRALISE 600 MG TABS Take 3 tablets by mouth every morning.   Yes Historical Provider, MD  insulin glargine (LANTUS) 100 UNIT/ML injection INJECT 90 UNITS UNDER THE SKIN EVERY DAY AS DIRECTED 12/14/13  Yes Collene Gobble, MD  Insulin Syringe-Needle U-100 (B-D INS SYR ULTRAFINE 1CC/31G) 31G X 5/16" 1 ML MISC Use as Directed 12/14/13  Yes Collene Gobble, MD  lisinopril (PRINIVIL,ZESTRIL) 5 MG tablet TAKE 1 TABLET BY MOUTH DAILY 12/05/13  Yes Collene Gobble, MD  NOVOLIN R 100 UNIT/ML injection USE 21 UNITS FOUR TIMES DAILY BEFORE MEALS PER SLIDING SCALE   Yes Collene Gobble, MD  oxyCODONE-acetaminophen (PERCOCET)  10-325 MG per tablet Take 1 tablet by mouth every 6 (six) hours as needed for pain.   Yes Historical Provider, MD  polyethylene glycol powder (GLYCOLAX/MIRALAX) powder MIX 17 GRAMS(ONE CAPFUL) IN LIQUID OF CHOICE AND DRINK DAILY   Yes Collene GobbleSteven A Jacion Dismore, MD  PROAIR HFA 108 (90 BASE) MCG/ACT inhaler INHALE 2 PUFFS EVERY 4 TO 6 HOURS AS NEEDED 10/03/13  Yes Collene GobbleSteven A Breshay Ilg, MD  spironolactone (ALDACTONE) 25 MG tablet TAKE 1 TABLET BY MOUTH EVERY DAY 10/12/13  Yes Collene GobbleSteven A Tarvis Blossom, MD  tiotropium (SPIRIVA HANDIHALER) 18 MCG inhalation capsule INHALE 1 CAPSULE IN HANDIHALER EVERY DAY AS DIRECTED 12/23/13  Yes Morrell RiddleSarah L Weber, PA-C  traMADol (ULTRAM) 50  MG tablet Take 100 mg by mouth every 6 (six) hours as needed for pain.   Yes Historical Provider, MD  VOLTAREN 1 % GEL as needed.   Yes Historical Provider, MD   History   Social History  . Marital Status: Single    Spouse Name: N/A    Number of Children: N/A  . Years of Education: N/A   Occupational History  . Not on file.   Social History Main Topics  . Smoking status: Former Smoker -- 2.00 packs/day for 30 years    Types: Cigarettes    Quit date: 07/02/1999  . Smokeless tobacco: Never Used  . Alcohol Use: No  . Drug Use: No  . Sexual Activity: No   Other Topics Concern  . Not on file   Social History Narrative     Review of Systems Constitutional: Negative for fever.  Gastrointestinal: Positive for abdominal pain and constipation. Negative for vomiting.   Objective:  Physical Exam Constitutional: He is oriented to person, place, and time. He appears well-nourished. No distress.  He has a tracheostomy. Morbidly obese.  HENT:  Head: Normocephalic and atraumatic.  Eyes: EOM are normal.  Neck: Normal range of motion.  Cardiovascular: Normal rate, regular rhythm and normal heart sounds.  No murmur heard. Pulmonary/Chest: Effort normal. No respiratory distress. He has decreased breath sounds (both bases). He has no wheezes. He has no rales.  Abdominal: Soft. There is no tenderness.  Genitourinary: Rectum normal and prostate normal.  Brown stool. Not impacted.  Musculoskeletal: Normal range of motion.  Severe stasis changes of both legs. Poor foot hygiene.  Neurological: He is alert and oriented to person, place, and time.  Skin: Skin is warm and dry. He is not diaphoretic.  Psychiatric: He has a normal mood and affect. His behavior is normal.  Nursing note and vitals reviewed.  Results for orders placed or performed in visit on 01/03/14  POCT CBC  Result Value Ref Range   WBC 14.4 (A) 4.6 - 10.2 K/uL   Lymph, poc 2.5 0.6 - 3.4   POC LYMPH PERCENT 17.1  10 - 50 %L   MID (cbc) 0.2 0 - 0.9   POC MID % 1.4 0 - 12 %M   POC Granulocyte 81.5 (A) 2 - 6.9   Granulocyte percent 11.7 (A) 37 - 80 %G   RBC 6.54 (A) 4.69 - 6.13 M/uL   Hemoglobin 17.5 14.1 - 18.1 g/dL   HCT, POC 40.955.4 (A) 81.143.5 - 53.7 %   MCV 84.7 80 - 97 fL   MCH, POC 26.8 (A) 27 - 31.2 pg   MCHC 31.6 (A) 31.8 - 35.4 g/dL   RDW, POC 91.417.6 %   Platelet Count, POC 247 142 - 424 K/uL   MPV 8.1 0 - 99.8  fL  POCT glucose (manual entry)  Result Value Ref Range   POC Glucose 57 (A) 70 - 99 mg/dl  POCT glycosylated hemoglobin (Hb A1C)  Result Value Ref Range   Hemoglobin A1C 9.1   POCT urinalysis dipstick  Result Value Ref Range   Color, UA yellow    Clarity, UA clear    Glucose, UA neg    Bilirubin, UA neg    Ketones, UA neg    Spec Grav, UA 1.020    Blood, UA small    pH, UA 6.0    Protein, UA 300    Urobilinogen, UA 2.0    Nitrite, UA neg    Leukocytes, UA Negative     BP 139/86 mmHg  Pulse 103  Temp(Src) 99.4 F (37.4 C) (Oral)  Resp 18  SpO2 91% Assessment & Plan:  No definite specific area of tenderness however patient has had a previous small bowel obstruction. He also has had a previous appendectomy. His white count is elevated and in the setting of his constipation he could have another early small bowel obstruction are even diverticulitis or cholecystitis. [facility] to be evaluated for possible CT abdomen pelvis.white count is 14,400 with a left shift.issue was low but he was given crackers and water and his repeat sugar was in range.  I personally performed the services described in this documentation, which was scribed in my presence. The recorded information has been reviewed and is accurate.

## 2014-01-03 NOTE — ED Notes (Signed)
[facility] after a visit to his PCP for constipation. Patient had been constipated for 2 weeks after he had stopped taking his miralax for a few days.

## 2014-01-03 NOTE — ED Provider Notes (Signed)
Medical screening examination/treatment/procedure(s) were conducted as a shared visit with non-physician practitioner(s) and myself.  I personally evaluated the patient during the encounter.   EKG Interpretation None     Patient here complaining of 4 days of constipation. Seen in urgent care center and concern for small bowel obstruction as patient has a history of this. He denies any vomiting. His abdomen is distended. Will obtain abdominal CT  Toy BakerAnthony T Ireene Ballowe, MD 01/03/14 1807

## 2014-01-03 NOTE — ED Notes (Signed)
Pt being sent from urgent care by Dr Cleta Albertsaub. Pt has trach and hx DM, abdominal pain x1-2 weeks. Hx of SBO. WBC 14.4 with left shift.

## 2014-01-03 NOTE — ED Notes (Signed)
Patient was given a urinal and he will try again soon.

## 2014-01-04 ENCOUNTER — Telehealth: Payer: Self-pay

## 2014-01-04 ENCOUNTER — Encounter (HOSPITAL_COMMUNITY): Payer: Self-pay | Admitting: Surgery

## 2014-01-04 DIAGNOSIS — Z79899 Other long term (current) drug therapy: Secondary | ICD-10-CM | POA: Diagnosis not present

## 2014-01-04 DIAGNOSIS — Z930 Tracheostomy status: Secondary | ICD-10-CM

## 2014-01-04 DIAGNOSIS — I10 Essential (primary) hypertension: Secondary | ICD-10-CM

## 2014-01-04 DIAGNOSIS — K819 Cholecystitis, unspecified: Secondary | ICD-10-CM | POA: Diagnosis not present

## 2014-01-04 DIAGNOSIS — Z6841 Body Mass Index (BMI) 40.0 and over, adult: Secondary | ICD-10-CM | POA: Diagnosis not present

## 2014-01-04 DIAGNOSIS — G4733 Obstructive sleep apnea (adult) (pediatric): Secondary | ICD-10-CM

## 2014-01-04 DIAGNOSIS — I509 Heart failure, unspecified: Secondary | ICD-10-CM | POA: Diagnosis not present

## 2014-01-04 DIAGNOSIS — E1140 Type 2 diabetes mellitus with diabetic neuropathy, unspecified: Secondary | ICD-10-CM

## 2014-01-04 DIAGNOSIS — K810 Acute cholecystitis: Secondary | ICD-10-CM

## 2014-01-04 DIAGNOSIS — I5032 Chronic diastolic (congestive) heart failure: Secondary | ICD-10-CM | POA: Diagnosis not present

## 2014-01-04 DIAGNOSIS — E780 Pure hypercholesterolemia: Secondary | ICD-10-CM | POA: Diagnosis present

## 2014-01-04 DIAGNOSIS — K5909 Other constipation: Secondary | ICD-10-CM | POA: Diagnosis present

## 2014-01-04 DIAGNOSIS — E1142 Type 2 diabetes mellitus with diabetic polyneuropathy: Secondary | ICD-10-CM | POA: Diagnosis present

## 2014-01-04 DIAGNOSIS — H9190 Unspecified hearing loss, unspecified ear: Secondary | ICD-10-CM | POA: Diagnosis present

## 2014-01-04 DIAGNOSIS — I5030 Unspecified diastolic (congestive) heart failure: Secondary | ICD-10-CM

## 2014-01-04 DIAGNOSIS — K5900 Constipation, unspecified: Secondary | ICD-10-CM

## 2014-01-04 DIAGNOSIS — Z794 Long term (current) use of insulin: Secondary | ICD-10-CM | POA: Diagnosis not present

## 2014-01-04 DIAGNOSIS — J449 Chronic obstructive pulmonary disease, unspecified: Secondary | ICD-10-CM | POA: Diagnosis not present

## 2014-01-04 DIAGNOSIS — K8000 Calculus of gallbladder with acute cholecystitis without obstruction: Secondary | ICD-10-CM | POA: Diagnosis not present

## 2014-01-04 DIAGNOSIS — E662 Morbid (severe) obesity with alveolar hypoventilation: Secondary | ICD-10-CM | POA: Diagnosis present

## 2014-01-04 DIAGNOSIS — Z87891 Personal history of nicotine dependence: Secondary | ICD-10-CM | POA: Diagnosis not present

## 2014-01-04 DIAGNOSIS — E785 Hyperlipidemia, unspecified: Secondary | ICD-10-CM | POA: Diagnosis present

## 2014-01-04 DIAGNOSIS — J9610 Chronic respiratory failure, unspecified whether with hypoxia or hypercapnia: Secondary | ICD-10-CM | POA: Diagnosis not present

## 2014-01-04 DIAGNOSIS — R1011 Right upper quadrant pain: Secondary | ICD-10-CM | POA: Diagnosis not present

## 2014-01-04 DIAGNOSIS — J9620 Acute and chronic respiratory failure, unspecified whether with hypoxia or hypercapnia: Secondary | ICD-10-CM | POA: Diagnosis not present

## 2014-01-04 DIAGNOSIS — K8020 Calculus of gallbladder without cholecystitis without obstruction: Secondary | ICD-10-CM | POA: Diagnosis not present

## 2014-01-04 DIAGNOSIS — Z9981 Dependence on supplemental oxygen: Secondary | ICD-10-CM | POA: Diagnosis not present

## 2014-01-04 LAB — COMPREHENSIVE METABOLIC PANEL
ALT: 17 U/L (ref 0–53)
AST: 20 U/L (ref 0–37)
Albumin: 2.8 g/dL — ABNORMAL LOW (ref 3.5–5.2)
Alkaline Phosphatase: 83 U/L (ref 39–117)
Anion gap: 12 (ref 5–15)
BUN: 14 mg/dL (ref 6–23)
CALCIUM: 9.4 mg/dL (ref 8.4–10.5)
CO2: 29 mEq/L (ref 19–32)
CREATININE: 0.68 mg/dL (ref 0.50–1.35)
Chloride: 94 mEq/L — ABNORMAL LOW (ref 96–112)
GFR calc Af Amer: >90 mL/min (ref >90)
GFR calc non Af Amer: >90 mL/min (ref >90)
Glucose, Bld: 74 mg/dL (ref 70–99)
Potassium: 4.9 mEq/L (ref 3.7–5.3)
Sodium: 135 mEq/L — ABNORMAL LOW (ref 137–147)
TOTAL PROTEIN: 7.4 g/dL (ref 6.0–8.3)
Total Bilirubin: 0.7 mg/dL (ref 0.3–1.2)

## 2014-01-04 LAB — COMPLETE METABOLIC PANEL WITH GFR
ALT: 16 U/L (ref 0–53)
AST: 16 U/L (ref 0–37)
Albumin: 3.4 g/dL — ABNORMAL LOW (ref 3.5–5.2)
Alkaline Phosphatase: 94 U/L (ref 39–117)
BILIRUBIN TOTAL: 0.5 mg/dL (ref 0.2–1.2)
BUN: 16 mg/dL (ref 6–23)
CALCIUM: 9.4 mg/dL (ref 8.4–10.5)
CO2: 28 meq/L (ref 19–32)
CREATININE: 0.71 mg/dL (ref 0.50–1.35)
Chloride: 94 mEq/L — ABNORMAL LOW (ref 96–112)
GFR, Est Non African American: >89 mL/min
Glucose, Bld: 32 mg/dL — CL (ref 70–99)
Potassium: 4.5 mEq/L (ref 3.5–5.3)
Sodium: 135 mEq/L (ref 135–145)
Total Protein: 6.9 g/dL (ref 6.0–8.3)

## 2014-01-04 LAB — GLUCOSE, CAPILLARY
GLUCOSE-CAPILLARY: 74 mg/dL (ref 70–99)
GLUCOSE-CAPILLARY: 89 mg/dL (ref 70–99)
GLUCOSE-CAPILLARY: 206 mg/dL — AB (ref 70–99)
Glucose-Capillary: 90 mg/dL (ref 70–99)
Glucose-Capillary: 193 mg/dL — ABNORMAL HIGH (ref 70–99)

## 2014-01-04 LAB — URINE CULTURE: Colony Count: 40000

## 2014-01-04 LAB — CBC
HEMATOCRIT: 53.3 % — AB (ref 39.0–52.0)
HEMOGLOBIN: 17 g/dL (ref 13.0–17.0)
MCH: 27.2 pg (ref 26.0–34.0)
MCHC: 31.9 g/dL (ref 30.0–36.0)
MCV: 85.3 fL (ref 78.0–100.0)
Platelets: 158 10*3/uL (ref 150–400)
RBC: 6.25 MIL/uL — AB (ref 4.22–5.81)
RDW: 16.3 % — ABNORMAL HIGH (ref 11.5–15.5)
WBC: 9.4 10*3/uL (ref 4.0–10.5)

## 2014-01-04 LAB — PROTIME-INR
INR: 1.1 (ref 0.00–1.49)
PROTHROMBIN TIME: 14.4 s (ref 11.6–15.2)

## 2014-01-04 LAB — SURGICAL PCR SCREEN
MRSA, PCR: NEGATIVE
STAPHYLOCOCCUS AUREUS: NEGATIVE

## 2014-01-04 LAB — PRO B NATRIURETIC PEPTIDE: Pro B Natriuretic peptide (BNP): 94.1 pg/mL (ref 0–125)

## 2014-01-04 LAB — APTT: aPTT: 30 seconds (ref 24–37)

## 2014-01-04 MED ORDER — FENTANYL CITRATE 0.05 MG/ML IJ SOLN
25.0000 ug | INTRAMUSCULAR | Status: DC | PRN
  Administered 2014-01-05 (×2): 50 ug via INTRAVENOUS
  Administered 2014-01-06: 25 ug via INTRAVENOUS
  Administered 2014-01-06: 50 ug via INTRAVENOUS
  Administered 2014-01-06 – 2014-01-07 (×2): 25 ug via INTRAVENOUS
  Filled 2014-01-04 (×6): qty 2

## 2014-01-04 MED ORDER — PROMETHAZINE HCL 25 MG/ML IJ SOLN: 6.2500 mg | INTRAMUSCULAR | Status: DC | PRN

## 2014-01-04 MED ORDER — HEPARIN SODIUM (PORCINE) 5000 UNIT/ML IJ SOLN
5000.0000 [IU] | Freq: Three times a day (TID) | INTRAMUSCULAR | Status: AC
  Administered 2014-01-04 (×2): 5000 [IU] via SUBCUTANEOUS
  Filled 2014-01-04 (×4): qty 1

## 2014-01-04 MED ORDER — POLYETHYLENE GLYCOL 3350 17 G PO PACK
17.0000 g | PACK | Freq: Two times a day (BID) | ORAL | Status: DC
  Administered 2014-01-04 – 2014-01-07 (×5): 17 g via ORAL
  Filled 2014-01-04 (×5): qty 1

## 2014-01-04 MED ORDER — LISINOPRIL 5 MG PO TABS
5.0000 mg | ORAL_TABLET | Freq: Every day | ORAL | Status: DC
  Administered 2014-01-04 – 2014-01-07 (×3): 5 mg via ORAL
  Filled 2014-01-04: qty 1
  Filled 2014-01-04: qty 2
  Filled 2014-01-04 (×2): qty 1

## 2014-01-04 MED ORDER — ATORVASTATIN CALCIUM 10 MG PO TABS
10.0000 mg | ORAL_TABLET | Freq: Every day | ORAL | Status: DC
  Administered 2014-01-04 – 2014-01-06 (×3): 10 mg via ORAL
  Filled 2014-01-04 (×5): qty 1

## 2014-01-04 MED ORDER — INSULIN GLARGINE 100 UNIT/ML ~~LOC~~ SOLN
60.0000 [IU] | Freq: Every day | SUBCUTANEOUS | Status: DC
  Filled 2014-01-04: qty 0.6

## 2014-01-04 MED ORDER — SODIUM CHLORIDE 0.9 % IV SOLN: 250.0000 mL | INTRAVENOUS | Status: DC | PRN

## 2014-01-04 MED ORDER — LIP MEDEX EX OINT
1.0000 "application " | TOPICAL_OINTMENT | Freq: Two times a day (BID) | CUTANEOUS | Status: DC
  Administered 2014-01-04 – 2014-01-07 (×6): 1 via TOPICAL
  Filled 2014-01-04 (×5): qty 7

## 2014-01-04 MED ORDER — TIOTROPIUM BROMIDE MONOHYDRATE 18 MCG IN CAPS
18.0000 ug | ORAL_CAPSULE | Freq: Every day | RESPIRATORY_TRACT | Status: DC
  Administered 2014-01-04: 18 ug via RESPIRATORY_TRACT
  Filled 2014-01-04: qty 5

## 2014-01-04 MED ORDER — ALBUTEROL SULFATE (2.5 MG/3ML) 0.083% IN NEBU: 2.5000 mg | INHALATION_SOLUTION | Freq: Four times a day (QID) | RESPIRATORY_TRACT | Status: DC

## 2014-01-04 MED ORDER — DICLOFENAC SODIUM 1 % TD GEL
2.0000 g | Freq: Every day | TRANSDERMAL | Status: DC | PRN
  Filled 2014-01-04: qty 100

## 2014-01-04 MED ORDER — SPIRONOLACTONE 25 MG PO TABS
25.0000 mg | ORAL_TABLET | Freq: Every day | ORAL | Status: DC
Start: 2014-01-04 — End: 2014-01-07
  Administered 2014-01-04 – 2014-01-07 (×3): 25 mg via ORAL
  Filled 2014-01-04 (×4): qty 1

## 2014-01-04 MED ORDER — BUDESONIDE 0.5 MG/2ML IN SUSP
0.5000 mg | Freq: Two times a day (BID) | RESPIRATORY_TRACT | Status: DC
  Administered 2014-01-04 – 2014-01-07 (×7): 0.5 mg via RESPIRATORY_TRACT
  Filled 2014-01-04 (×6): qty 2

## 2014-01-04 MED ORDER — MOMETASONE FURO-FORMOTEROL FUM 200-5 MCG/ACT IN AERO
2.0000 | INHALATION_SPRAY | Freq: Two times a day (BID) | RESPIRATORY_TRACT | Status: DC
  Administered 2014-01-04: 2 via RESPIRATORY_TRACT
  Filled 2014-01-04: qty 8.8

## 2014-01-04 MED ORDER — BISACODYL 10 MG RE SUPP: 10.0000 mg | Freq: Two times a day (BID) | RECTAL | Status: DC | PRN

## 2014-01-04 MED ORDER — CHLORHEXIDINE GLUCONATE 4 % EX LIQD
1.0000 "application " | Freq: Once | CUTANEOUS | Status: DC
  Filled 2014-01-04 (×2): qty 15

## 2014-01-04 MED ORDER — GABAPENTIN 400 MG PO CAPS
1200.0000 mg | ORAL_CAPSULE | Freq: Every day | ORAL | Status: DC | PRN
  Filled 2014-01-04: qty 3

## 2014-01-04 MED ORDER — SODIUM CHLORIDE 0.9 % IJ SOLN
3.0000 mL | Freq: Two times a day (BID) | INTRAMUSCULAR | Status: DC
  Administered 2014-01-04 – 2014-01-07 (×6): 3 mL via INTRAVENOUS

## 2014-01-04 MED ORDER — FUROSEMIDE 40 MG PO TABS
40.0000 mg | ORAL_TABLET | Freq: Every day | ORAL | Status: DC
  Administered 2014-01-04 – 2014-01-07 (×3): 40 mg via ORAL
  Filled 2014-01-04 (×4): qty 1

## 2014-01-04 MED ORDER — POLYETHYLENE GLYCOL 3350 17 G PO PACK
17.0000 g | PACK | Freq: Two times a day (BID) | ORAL | Status: DC | PRN
  Filled 2014-01-04: qty 1

## 2014-01-04 MED ORDER — PIPERACILLIN-TAZOBACTAM 3.375 G IVPB
3.3750 g | Freq: Three times a day (TID) | INTRAVENOUS | Status: DC
  Administered 2014-01-04 – 2014-01-06 (×6): 3.375 g via INTRAVENOUS
  Filled 2014-01-04 (×10): qty 50

## 2014-01-04 MED ORDER — ACETAMINOPHEN 500 MG PO TABS
1000.0000 mg | ORAL_TABLET | Freq: Three times a day (TID) | ORAL | Status: DC
  Administered 2014-01-04 – 2014-01-07 (×8): 1000 mg via ORAL
  Filled 2014-01-04 (×13): qty 2

## 2014-01-04 MED ORDER — CETYLPYRIDINIUM CHLORIDE 0.05 % MT LIQD
7.0000 mL | Freq: Four times a day (QID) | OROMUCOSAL | Status: DC
  Administered 2014-01-04 – 2014-01-07 (×7): 7 mL via OROMUCOSAL

## 2014-01-04 MED ORDER — SODIUM CHLORIDE 0.9 % IJ SOLN: 3.0000 mL | INTRAMUSCULAR | Status: DC | PRN

## 2014-01-04 MED ORDER — MORPHINE SULFATE 2 MG/ML IJ SOLN
2.0000 mg | INTRAMUSCULAR | Status: DC | PRN
  Administered 2014-01-04: 2 mg via INTRAVENOUS
  Filled 2014-01-04: qty 1

## 2014-01-04 MED ORDER — MAGIC MOUTHWASH
15.0000 mL | Freq: Four times a day (QID) | ORAL | Status: DC | PRN
  Filled 2014-01-04: qty 15

## 2014-01-04 MED ORDER — PHENOL 1.4 % MT LIQD
2.0000 | OROMUCOSAL | Status: DC | PRN
  Filled 2014-01-04: qty 177

## 2014-01-04 MED ORDER — TRAMADOL HCL 50 MG PO TABS
100.0000 mg | ORAL_TABLET | Freq: Four times a day (QID) | ORAL | Status: DC | PRN
  Administered 2014-01-06 – 2014-01-07 (×2): 100 mg via ORAL
  Filled 2014-01-04 (×2): qty 2

## 2014-01-04 MED ORDER — ALBUTEROL SULFATE HFA 108 (90 BASE) MCG/ACT IN AERS: 1.0000 | INHALATION_SPRAY | Freq: Four times a day (QID) | RESPIRATORY_TRACT | Status: DC | PRN

## 2014-01-04 MED ORDER — ALUM & MAG HYDROXIDE-SIMETH 200-200-20 MG/5ML PO SUSP: 30.0000 mL | Freq: Four times a day (QID) | ORAL | Status: DC | PRN

## 2014-01-04 MED ORDER — ALBUTEROL SULFATE (2.5 MG/3ML) 0.083% IN NEBU
2.5000 mg | INHALATION_SOLUTION | Freq: Four times a day (QID) | RESPIRATORY_TRACT | Status: DC | PRN
  Administered 2014-01-06: 2.5 mg via RESPIRATORY_TRACT
  Filled 2014-01-04 (×2): qty 3

## 2014-01-04 MED ORDER — OXYCODONE-ACETAMINOPHEN 5-325 MG PO TABS: 1.0000 | ORAL_TABLET | Freq: Four times a day (QID) | ORAL | Status: DC | PRN

## 2014-01-04 MED ORDER — INSULIN ASPART 100 UNIT/ML ~~LOC~~ SOLN
0.0000 [IU] | Freq: Three times a day (TID) | SUBCUTANEOUS | Status: DC
  Administered 2014-01-04: 2 [IU] via SUBCUTANEOUS
  Administered 2014-01-05: 5 [IU] via SUBCUTANEOUS
  Administered 2014-01-05: 1 [IU] via SUBCUTANEOUS

## 2014-01-04 MED ORDER — SODIUM CHLORIDE 0.9 % IV SOLN
3.0000 g | Freq: Once | INTRAVENOUS | Status: DC
  Filled 2014-01-04: qty 3

## 2014-01-04 MED ORDER — POLYETHYLENE GLYCOL 3350 17 G PO PACK: 17.0000 g | PACK | Freq: Every day | ORAL | Status: DC

## 2014-01-04 MED ORDER — DIPHENHYDRAMINE HCL 50 MG/ML IJ SOLN: 12.5000 mg | Freq: Four times a day (QID) | INTRAMUSCULAR | Status: DC | PRN

## 2014-01-04 MED ORDER — ARFORMOTEROL TARTRATE 15 MCG/2ML IN NEBU
15.0000 ug | INHALATION_SOLUTION | Freq: Two times a day (BID) | RESPIRATORY_TRACT | Status: DC
  Administered 2014-01-04 – 2014-01-07 (×7): 15 ug via RESPIRATORY_TRACT
  Filled 2014-01-04 (×12): qty 2

## 2014-01-04 MED ORDER — OXYCODONE-ACETAMINOPHEN 10-325 MG PO TABS: 1.0000 | ORAL_TABLET | Freq: Four times a day (QID) | ORAL | Status: DC | PRN

## 2014-01-04 MED ORDER — MENTHOL 3 MG MT LOZG
1.0000 | LOZENGE | OROMUCOSAL | Status: DC | PRN
  Filled 2014-01-04: qty 9

## 2014-01-04 MED ORDER — ALBUTEROL SULFATE (2.5 MG/3ML) 0.083% IN NEBU
2.5000 mg | INHALATION_SOLUTION | Freq: Four times a day (QID) | RESPIRATORY_TRACT | Status: DC
  Administered 2014-01-04 – 2014-01-07 (×12): 2.5 mg via RESPIRATORY_TRACT
  Filled 2014-01-04 (×13): qty 3

## 2014-01-04 MED ORDER — OXYCODONE HCL 5 MG PO TABS
5.0000 mg | ORAL_TABLET | ORAL | Status: DC | PRN
  Administered 2014-01-04 – 2014-01-06 (×4): 10 mg via ORAL
  Filled 2014-01-04 (×5): qty 2

## 2014-01-04 MED ORDER — CHLORHEXIDINE GLUCONATE 0.12 % MT SOLN
15.0000 mL | Freq: Two times a day (BID) | OROMUCOSAL | Status: DC
  Administered 2014-01-04 – 2014-01-07 (×6): 15 mL via OROMUCOSAL
  Filled 2014-01-04 (×9): qty 15

## 2014-01-04 MED ORDER — GABAPENTIN 300 MG PO CAPS
600.0000 mg | ORAL_CAPSULE | Freq: Two times a day (BID) | ORAL | Status: DC
  Administered 2014-01-04 – 2014-01-07 (×6): 600 mg via ORAL
  Filled 2014-01-04 (×9): qty 2

## 2014-01-04 MED ORDER — INSULIN GLARGINE 100 UNIT/ML ~~LOC~~ SOLN
40.0000 [IU] | Freq: Every day | SUBCUTANEOUS | Status: DC
  Administered 2014-01-04: 40 [IU] via SUBCUTANEOUS
  Filled 2014-01-04 (×2): qty 0.4

## 2014-01-04 MED ORDER — OXYCODONE HCL 5 MG PO TABS: 5.0000 mg | ORAL_TABLET | Freq: Four times a day (QID) | ORAL | Status: DC | PRN

## 2014-01-04 NOTE — Progress Notes (Signed)
[facility] PROGRESS NOTE  Tyrone Cunningham ZOX:[id number] DOB: 29-Nov-1955 DOA: 01/03/2014 PCP: Lucilla Edin, MD  Assessment/Plan: Acute cholecystitis On clear liquids. Continue empiric Zosyn. Currently  Pain well controlled. Zofran when necessary for nausea. Seen by general surgery and planned on  cholecystectomy on 11/5 as patient is cleared for surgery by pulmonary consult. Nothing by mouth after midnight  COPD with chronic respiratory failure and chronic trach penance due to OSA/ OHS [name] changed to cuff 6 Shiley by pulmonary for surgery.recommend monitoring on stepdown postop Continue nebs and home inhaler  Type 2 diabetes mellitus A1c of 9.1. FSG < 100.Reduced Lantus to 40 units for surgery.  Chronic diastolic CHF Euvolemic. Holding Lasix. Continue Aldactone.  Hypertension Continue lisinopril  Chronic constipation Continue MiraLAX  DVT prophylaxis: Subcutaneous heparin   Code Status: full code Family Communication: partner at bedside Disposition Plan:  Home once stable   Consultants:  Surgery  Pulmonary  Procedures:  Cholecystectomy planned for 11/5  Antibiotics:  IV Zosyn  HPI/Subjective: Patient seen and examined.reports abdominal pain to be better. Denies nausea or vomiting  Objective: Filed Vitals:   01/04/14 1720  BP:   Pulse: 80  Temp:   Resp: 18    Intake/Output Summary (Last 24 hours) at 01/04/14 1802 Last data filed at 01/04/14 1512  Gross per 24 hour  Intake    240 ml  Output   1100 ml  Net   -860 ml   Filed Weights   01/04/14 0338  Weight: 155.4 kg (342 lb 9.5 oz)    Exam:   General:  Elderly obese male in no acute distress  HEENT: Trached, moist oral mucosa, no icterus  Chest:scattered rhonchi bilaterally  CVS: Normal S1-S2, no murmurs  Abdomen: Obese, nontender,bowel sounds present  extremities: Warm, no edema   Data Reviewed: Basic Metabolic Panel:  Recent Labs Lab 01/03/14 1506 01/03/14 1738  01/04/14 0750  NA 135 133* 135*  K 4.5 4.4 4.9  CL 94* 92* 94*  CO2 28 30 29   GLUCOSE 32* 100* 74  BUN 16 17 14   CREATININE 0.71 0.71 0.68  CALCIUM 9.4 9.5 9.4   Liver Function Tests:  Recent Labs Lab 01/03/14 1506 01/03/14 1738 01/04/14 0750  AST 16 17 20   ALT 16 18 17   ALKPHOS 94 86 83  BILITOT 0.5 0.3 0.7  PROT 6.9 7.7 7.4  ALBUMIN 3.4* 2.8* 2.8*    Recent Labs Lab 01/03/14 1738  LIPASE 35   No results for input(s): AMMONIA in the last 168 hours. CBC:  Recent Labs Lab 01/03/14 1502 01/03/14 1738 01/04/14 0750  WBC 14.4* 12.4* 9.4  NEUTROABS  --  10.0*  --   HGB 17.5 17.2* 17.0  HCT 55.4* 52.8* 53.3*  MCV 84.7 83.9 85.3  PLT  --  180 158   Cardiac Enzymes: No results for input(s): CKTOTAL, CKMB, CKMBINDEX, TROPONINI in the last 168 hours. BNP (last 3 results)  Recent Labs  01/04/14 0750  PROBNP 94.1   CBG:  Recent Labs Lab 01/03/14 2111 01/04/14 0339 01/04/14 0817 01/04/14 1204 01/04/14 1725  GLUCAP 79 74 90 89 193*    Recent Results (from the past 240 hour(s))  Surgical PCR screen     Status: None   Collection Time: 01/04/14  3:41 AM  Result Value Ref Range Status   MRSA, PCR NEGATIVE NEGATIVE Final   Staphylococcus aureus NEGATIVE NEGATIVE Final    Comment:        The Xpert SA Assay (FDA approved  for NASAL specimens in patients over 60 years of age), is one component of a comprehensive surveillance program.  Test performance has been validated by [facility] for patients greater than or equal to 60 year old. It is not intended to diagnose infection nor to guide or monitor treatment.      Studies: Ct Abdomen Pelvis W Contrast  01/03/2014   CLINICAL DATA:  58 year old male with right upper quadrant abdominal pain for the past 4 days, with some associated shortness of breath.  EXAM: CT ABDOMEN AND PELVIS WITH CONTRAST  TECHNIQUE: Multidetector CT imaging of the abdomen and pelvis was performed using the standard protocol  following bolus administration of intravenous contrast.  CONTRAST:  100mL OMNIPAQUE IOHEXOL 300 MG/ML  SOLN  COMPARISON:  CT of the abdomen and pelvis 03/09/2011.  FINDINGS: Lower chest:  Mild cardiomegaly.  Mild bibasilar scarring.  Hepatobiliary: Large amount of amorphous high attenuation material layering dependently in the gallbladder, presumably biliary sludge or noncalcified gallstones. Gallbladder is moderately distended. However, the gallbladder wall does not appear thickened, and there is no pericholecystic fluid or inflammatory changes. Diffuse decreased attenuation throughout the hepatic parenchyma, compatible with hepatic steatosis. No focal cystic or solid hepatic lesions. No intra or extrahepatic biliary ductal dilatation.  Pancreas: Very mild haziness adjacent to the pancreatic head. The appearance of the pancreas is otherwise unremarkable.  Spleen: Unremarkable.  Adrenals/Urinary Tract: Bilateral adrenal glands are normal in appearance. Bilateral kidneys are also normal in appearance mild bilateral perinephric stranding (nonspecific). No hydroureteronephrosis to indicate urinary tract obstruction at this time. Urinary bladder is unremarkable in appearance.  Stomach/Bowel: The stomach is normal in appearance. No pathologic dilatation of small bowel or colon.  Vascular/Lymphatic: Extensive atherosclerosis throughout the abdominal and pelvic vasculature, without evidence of aneurysm or dissection. No pathologically enlarged lymph nodes are noted in the abdomen or pelvis.  Reproductive: Prostate gland is unremarkable in appearance.  Other: No significant volume of ascites.  No pneumoperitoneum.  Musculoskeletal: There are no aggressive appearing lytic or blastic lesions noted in the visualized portions of the skeleton. Status post ORIF in the left femoral neck.  IMPRESSION: 1. Large amount of biliary sludge and/or small noncalcified gallstones lying dependently in the gallbladder. No definite findings to  suggest acute cholecystitis at this time. 2. There is very mild haziness in the peripancreatic fat adjacent to the pancreatic head. The possibility of mild pancreatitis warrants consideration, and correlation with lipase levels is recommended. 3. Hepatic steatosis. 4. Atherosclerosis. 5. Additional incidental findings, as above.   Electronically Signed   By: Trudie Reedaniel  Entrikin M.D.   On: 01/03/2014 20:06   [place] Abdomen Limited Ruq  01/03/2014   CLINICAL DATA:  Abdominal pain and constipation.  EXAM: US ABDOMEN LIMITED - RIGHT UPPER QUADRANT  COMPARISON:  None.  FINDINGS: Gallbladder:  Cholelithiasis with sludge and small stones layering in the gallbladder. No gallbladder wall thickening. Murphy's sign is positive. Changes are compatible with acute cholecystitis in the appropriate clinical setting.  Common bile duct:  Diameter: Not visualized due to body habitus and bowel gas. No intrahepatic bile duct dilatation.  Liver:  Diffusely heterogeneous liver parenchymal echotexture likely representing diffuse fatty infiltration. Portions of the liver are not visualized due to body habitus and shadowing.  IMPRESSION: Cholelithiasis with sludge and stones in the gallbladder and positive Murphy's sign. Changes are compatible with acute cholecystitis in the appropriate clinical setting.   Electronically Signed   By: Burman NievesWilliam  Stevens M.D.   On: 01/03/2014 23:55  Scheduled Meds: . acetaminophen  1,000 mg Oral TID  . albuterol  2.5 mg Nebulization QID  . antiseptic oral rinse  7 mL Mouth Rinse QID  . arformoterol  15 mcg Nebulization BID  . atorvastatin  10 mg Oral q1800  . budesonide (PULMICORT) nebulizer solution  0.5 mg Nebulization BID  . [START ON 01/05/2014] chlorhexidine  1 application Topical Once  . chlorhexidine  15 mL Mouth Rinse BID  . furosemide  40 mg Oral Daily  . gabapentin  600 mg Oral BID  . heparin  5,000 Units Subcutaneous 3 times per day  . insulin aspart  0-9 Units Subcutaneous TID WC  .  insulin glargine  40 Units Subcutaneous Daily  . lip balm  1 application Topical BID  . lisinopril  5 mg Oral Daily  . piperacillin-tazobactam (ZOSYN)  IV  3.375 g Intravenous 3 times per day  . polyethylene glycol  17 g Oral BID  . sodium chloride  3 mL Intravenous Q12H  . spironolactone  25 mg Oral Daily   Continuous Infusions:     Time spent: 20 minutes    Maybelline Kolarik  [facility] Pager [phone]. If 7PM-7AM, please contact night-coverage at [email or web address], password [facility] 01/04/2014, 6:02 PM  LOS: 1 day

## 2014-01-04 NOTE — Consult Note (Signed)
[facility]  [address] [place], [place] [address] Phone: [phone] FAX: [phone]     [name]  60-Jul-1957 [id number]  CARE TEAM:  PCP: Jenny Reichmann, MD  Outpatient Care Team: Patient Care Team: Darlyne Russian, MD as PCP - General (Family Medicine) Elsie Stain, MD as Attending Physician (Pulmonary Disease) Josue Hector, MD as Consulting Physician (Cardiology) Ladene Artist, MD as Consulting Physician (Gastroenterology)  Inpatient Treatment Team: Treatment Team: Attending Provider: Ivor Costa, MD; Consulting Physician: Nolon Nations, MD; Registered Nurse: Franco Collet, RN; Rounding Team: Fatima Blank, MD; Technician: Arlean Hopping, NT  This patient is a 58 y.o.male who presents today for surgical evaluation at the request of Jamse Mead, Dirk Dress ED PA.   Reason for evaluation: Abd pain ?gallbladder  Morbidly obese male with severe COPD.  Oxygen dependent with trach.  Poorly controlled diabetes.  Heart failure.  I struggled with chronic constipation.  Usually controlled parallax.  Noted it is worsened over the past week or so.  Felt some abdominal pain as well.  Worse with eating.  Pain is more constant.  Tends to be on the right side.  Came to the emergency room.  CT scan done.  No large stool burden.  No bowel obstruction.  There are some question of prior bowel obstruction.  Gallbladder noted to have moderate volume of sludge.  Ultrasound confirms positive Murphy sign.  Some possible peripancreatic inflammation as well.  Suspicious for cholecystitis.  Surgeon Ivin Booty recommended.  Medicine admission given medical complexity be requested with surgical consultation.  Patient notes he still has pain.  Has chronic metal trach stoma.  Talks with putting finger over it.  Narcotic medications to help.  Some decreased appetite.  Only able to eat crackers only.  Some mild nausea.  No retching.  Can walk maybe across the hallway  before feeling very short of breath.  COPD managed by pulmonologist.  Dr. Joya Gaskins saw him last year for clearance for tooth surgery  Past Medical History  Diagnosis Date  . Bronchitis   . CHF (congestive heart failure)   . DM type 2 with diabetic peripheral neuropathy   . Hypertension   . Hypercholesteremia   . Hearing loss   . Peripheral neuropathy   . Sleep apnea     Severe sleep apnea requiring tracheostomy  . COPD (chronic obstructive pulmonary disease)     Past Surgical History  Procedure Laterality Date  . Tracheostomy    . Appendectomy    . Hand surgery    . Femur surgery    . Multiple extractions with alveoloplasty N/A 05/11/2012    Procedure: MULTIPLE EXTRACTION WITH ALVEOLOPLASTY;  Surgeon: Gae Bon, DDS;  Location: [facility];  Service: Oral Surgery;  Laterality: N/A;  . Femur hardware removal  09/01/2003    Removal of retained hardware, two interlocking distal femoral    History   Social History  . Marital Status: Single    Spouse Name: N/A    Number of Children: N/A  . Years of Education: N/A   Occupational History  . Not on file.   Social History Main Topics  . Smoking status: Former Smoker -- 2.00 packs/day for 30 years    Types: Cigarettes    Quit date: 07/02/1999  . Smokeless tobacco: Never Used  . Alcohol Use: No  . Drug Use: No  . Sexual Activity: No   Other Topics Concern  . Not on  file   Social History Narrative    Family History  Problem Relation Age of Onset  . Coronary artery disease Father     Current Facility-Administered Medications  Medication Dose Route Frequency Provider Last Rate Last Dose  . 0.9 %  sodium chloride infusion  250 mL Intravenous PRN Ivor Costa, MD      . albuterol (PROVENTIL) (2.5 MG/3ML) 0.083% nebulizer solution 2.5 mg  2.5 mg Nebulization Q6H PRN Ivor Costa, MD      . albuterol (PROVENTIL) (2.5 MG/3ML) 0.083% nebulizer solution 2.5 mg  2.5 mg Nebulization QID Ivor Costa, MD      . antiseptic oral rinse ([place] /  CETYLPYRIDINIUM CHLORIDE 0.05%) solution 7 mL  7 mL Mouth Rinse QID Ivor Costa, MD      . atorvastatin (LIPITOR) tablet 10 mg  10 mg Oral q1800 Ivor Costa, MD      . chlorhexidine (PERIDEX) 0.12 % solution 15 mL  15 mL Mouth Rinse BID Ivor Costa, MD      . diclofenac sodium (VOLTAREN) 1 % transdermal gel 2 g  2 g Topical Daily PRN Ivor Costa, MD      . furosemide (LASIX) tablet 40 mg  40 mg Oral Daily Ivor Costa, MD      . gabapentin (NEURONTIN) capsule 1,200 mg  1,200 mg Oral Daily PRN Ivor Costa, MD      . gabapentin (NEURONTIN) capsule 600 mg  600 mg Oral BID Ivor Costa, MD      . heparin injection 5,000 Units  5,000 Units Subcutaneous 3 times per day Ivor Costa, MD   5,000 Units at 01/04/14 0456  . insulin aspart (novoLOG) injection 0-9 Units  0-9 Units Subcutaneous TID WC Ivor Costa, MD      . insulin glargine (LANTUS) injection 60 Units  60 Units Subcutaneous Daily Ivor Costa, MD      . lisinopril (PRINIVIL,ZESTRIL) tablet 5 mg  5 mg Oral Daily Ivor Costa, MD      . mometasone-formoterol (DULERA) 200-5 MCG/ACT inhaler 2 puff  2 puff Inhalation BID Ivor Costa, MD      . morphine 2 MG/ML injection 2 mg  2 mg Intravenous Q4H PRN Ivor Costa, MD   2 mg at 01/04/14 0517  . oxyCODONE-acetaminophen (PERCOCET/ROXICET) 5-325 MG per tablet 1 tablet  1 tablet Oral Q6H PRN Ivor Costa, MD       And  . oxyCODONE (Oxy IR/ROXICODONE) immediate release tablet 5 mg  5 mg Oral Q6H PRN Ivor Costa, MD      . piperacillin-tazobactam (ZOSYN) IVPB 3.375 g  3.375 g Intravenous 3 times per day Ivor Costa, MD   3.375 g at 01/04/14 0459  . polyethylene glycol (MIRALAX / GLYCOLAX) packet 17 g  17 g Oral Daily Ivor Costa, MD      . sodium chloride 0.9 % injection 3 mL  3 mL Intravenous Q12H Ivor Costa, MD   3 mL at 01/04/14 0401  . sodium chloride 0.9 % injection 3 mL  3 mL Intravenous PRN Ivor Costa, MD      . spironolactone (ALDACTONE) tablet 25 mg  25 mg Oral Daily Ivor Costa, MD      . tiotropium [facility]) inhalation capsule 18 mcg  18  mcg Inhalation Daily Ivor Costa, MD      . traMADol Veatrice Bourbon) tablet 100 mg  100 mg Oral Q6H PRN Ivor Costa, MD         No Known Allergies  ROS: Constitutional:  No fevers,  chills, sweats.  Weight stable Eyes:  No vision changes, No discharge HENT:  No sore throats, nasal drainage Lymph: No neck swelling, No bruising easily Pulmonary:  No cough, productive sputum CV: +orthopnea, PND  No exertional chest/neck/shoulder/arm pain.  [place] DOE GI:  No personal nor family history of GI/colon cancer, inflammatory bowel disease, irritable bowel syndrome, allergy such as Celiac Sprue, dietary/dairy problems, colitis, ulcers nor gastritis.  No recent sick contacts/gastroenteritis.  No travel outside the country.  No changes in diet. Renal: No UTIs, No hematuria Genital:  No drainage, bleeding, masses Musculoskeletal: No severe joint pain.  Good ROM major joints Skin:  No sores or lesions.  No rashes Heme/Lymph:  No easy bleeding.  No swollen lymph nodes Neuro: No focal weakness/numbness.  No seizures Psych: No suicidal ideation.  No hallucinations  BP 131/59 mmHg  Pulse 95  Temp(Src) 99.1 F (37.3 C) (Oral)  Resp 20  Ht 5' 7"  (1.702 m)  Wt 342 lb 9.5 oz (155.4 kg)  BMI 53.65 kg/m2  SpO2 98%  Physical Exam: General: Pt awakens alert/oriented x4 in mild acute distress Eyes: PERRL, normal EOM. Sclera nonicteric Neuro: CN II-XII intact w/o focal sensory/motor deficits. Lymph: No head/neck/groin lymphadenopathy Psych:  No delerium/psychosis/paranoia HENT: Normocephalic, Mucus membranes moist.  No thrush Neck: Supple, No tracheal deviation.  Trach clean.  No major secretions Chest: No pain.  Fair respiratory excursion.  Dec BS bases CV:  Pulses intact.  Regular rhythm Abdomen: Large panniculus.   Soft, Nondistended.  TTP RUQ ++Murphy's sign.  Elsewhere nontender.  No incarcerated hernias. Ext:  SCDs BLE.  2+ woody edema.  No cyanosis Skin: No petechiae / purpurea.  No major  sores Musculoskeletal: No severe joint pain.  Good ROM major joints   Results:   Labs: Results for orders placed or performed during the hospital encounter of 01/03/14 (from the past 48 hour(s))  CBC with Differential     Status: Abnormal   Collection Time: 01/03/14  5:38 PM  Result Value Ref Range   WBC 12.4 (H) 4.0 - 10.5 K/uL   RBC 6.29 (H) 4.22 - 5.81 MIL/uL   Hemoglobin 17.2 (H) 13.0 - 17.0 g/dL   HCT 52.8 (H) 39.0 - 52.0 %   MCV 83.9 78.0 - 100.0 fL   MCH 27.3 26.0 - 34.0 pg   MCHC 32.6 30.0 - 36.0 g/dL   RDW 16.1 (H) 11.5 - 15.5 %   Platelets 180 150 - 400 K/uL   Neutrophils Relative % 80 (H) 43 - 77 %   Neutro Abs 10.0 (H) 1.7 - 7.7 K/uL   Lymphocytes Relative 14 12 - 46 %   Lymphs Abs 1.7 0.7 - 4.0 K/uL   Monocytes Relative 5 3 - 12 %   Monocytes Absolute 0.7 0.1 - 1.0 K/uL   Eosinophils Relative 1 0 - 5 %   Eosinophils Absolute 0.1 0.0 - 0.7 K/uL   Basophils Relative 0 0 - 1 %   Basophils Absolute 0.0 0.0 - 0.1 K/uL  Comprehensive metabolic panel     Status: Abnormal   Collection Time: 01/03/14  5:38 PM  Result Value Ref Range   Sodium 133 (L) 137 - 147 mEq/L   Potassium 4.4 3.7 - 5.3 mEq/L   Chloride 92 (L) 96 - 112 mEq/L   CO2 30 19 - 32 mEq/L   Glucose, Bld 100 (H) 70 - 99 mg/dL   BUN 17 6 - 23 mg/dL   Creatinine, Ser 0.71 0.50 - 1.35 mg/dL  Calcium 9.5 8.4 - 10.5 mg/dL   Total Protein 7.7 6.0 - 8.3 g/dL   Albumin 2.8 (L) 3.5 - 5.2 g/dL   AST 17 0 - 37 U/L   ALT 18 0 - 53 U/L   Alkaline Phosphatase 86 39 - 117 U/L   Total Bilirubin 0.3 0.3 - 1.2 mg/dL   GFR calc non Af Amer >90 >90 mL/min   GFR calc Af Amer >90 >90 mL/min    Comment: (NOTE) The eGFR has been calculated using the CKD EPI equation. This calculation has not been validated in all clinical situations. eGFR's persistently <90 mL/min signify possible Chronic Kidney Disease.    Anion gap 11 5 - 15  Lipase, blood     Status: None   Collection Time: 01/03/14  5:38 PM  Result Value Ref  Range   Lipase 35 11 - 59 U/L  Urinalysis, Routine w reflex microscopic     Status: Abnormal   Collection Time: 01/03/14  6:14 PM  Result Value Ref Range   Color, Urine YELLOW YELLOW   APPearance CLEAR CLEAR   Specific Gravity, Urine 1.019 1.005 - 1.030   pH 6.0 5.0 - 8.0   Glucose, UA NEGATIVE NEGATIVE mg/dL   Hgb urine dipstick TRACE (A) NEGATIVE   Bilirubin Urine NEGATIVE NEGATIVE   Ketones, ur NEGATIVE NEGATIVE mg/dL   Protein, ur >300 (A) NEGATIVE mg/dL   Urobilinogen, UA 1.0 0.0 - 1.0 mg/dL   Nitrite NEGATIVE NEGATIVE   Leukocytes, UA NEGATIVE NEGATIVE  Urine microscopic-add on     Status: Abnormal   Collection Time: 01/03/14  6:14 PM  Result Value Ref Range   Squamous Epithelial / LPF RARE RARE   WBC, UA 0-2 <3 WBC/hpf   RBC / HPF 0-2 <3 RBC/hpf   Bacteria, UA RARE RARE   Casts HYALINE CASTS (A) NEGATIVE  POC CBG, ED     Status: None   Collection Time: 01/03/14  9:11 PM  Result Value Ref Range   Glucose-Capillary 79 70 - 99 mg/dL  Glucose, capillary     Status: None   Collection Time: 01/04/14  3:39 AM  Result Value Ref Range   Glucose-Capillary 74 70 - 99 mg/dL  Surgical PCR screen     Status: None   Collection Time: 01/04/14  3:41 AM  Result Value Ref Range   MRSA, PCR NEGATIVE NEGATIVE   Staphylococcus aureus NEGATIVE NEGATIVE    Comment:        The Xpert SA Assay (FDA approved for NASAL specimens in patients over 36 years of age), is one component of a comprehensive surveillance program.  Test performance has been validated by [employer] for patients greater than or equal to 11 year old. It is not intended to diagnose infection nor to guide or monitor treatment.     Imaging / Studies: Ct Abdomen Pelvis W Contrast  01/03/2014   CLINICAL DATA:  58 year old male with right upper quadrant abdominal pain for the past 4 days, with some associated shortness of breath.  EXAM: CT ABDOMEN AND PELVIS WITH CONTRAST  TECHNIQUE: Multidetector CT imaging of the  abdomen and pelvis was performed using the standard protocol following bolus administration of intravenous contrast.  CONTRAST:  137m OMNIPAQUE IOHEXOL 300 MG/ML  SOLN  COMPARISON:  CT of the abdomen and pelvis 03/09/2011.  FINDINGS: Lower chest:  Mild cardiomegaly.  Mild bibasilar scarring.  Hepatobiliary: Large amount of amorphous high attenuation material layering dependently in the gallbladder, presumably biliary sludge or noncalcified  gallstones. Gallbladder is moderately distended. However, the gallbladder wall does not appear thickened, and there is no pericholecystic fluid or inflammatory changes. Diffuse decreased attenuation throughout the hepatic parenchyma, compatible with hepatic steatosis. No focal cystic or solid hepatic lesions. No intra or extrahepatic biliary ductal dilatation.  Pancreas: Very mild haziness adjacent to the pancreatic head. The appearance of the pancreas is otherwise unremarkable.  Spleen: Unremarkable.  Adrenals/Urinary Tract: Bilateral adrenal glands are normal in appearance. Bilateral kidneys are also normal in appearance mild bilateral perinephric stranding (nonspecific). No hydroureteronephrosis to indicate urinary tract obstruction at this time. Urinary bladder is unremarkable in appearance.  Stomach/Bowel: The stomach is normal in appearance. No pathologic dilatation of small bowel or colon.  Vascular/Lymphatic: Extensive atherosclerosis throughout the abdominal and pelvic vasculature, without evidence of aneurysm or dissection. No pathologically enlarged lymph nodes are noted in the abdomen or pelvis.  Reproductive: Prostate gland is unremarkable in appearance.  Other: No significant volume of ascites.  No pneumoperitoneum.  Musculoskeletal: There are no aggressive appearing lytic or blastic lesions noted in the visualized portions of the skeleton. Status post ORIF in the left femoral neck.  IMPRESSION: 1. Large amount of biliary sludge and/or small noncalcified gallstones  lying dependently in the gallbladder. No definite findings to suggest acute cholecystitis at this time. 2. There is very mild haziness in the peripancreatic fat adjacent to the pancreatic head. The possibility of mild pancreatitis warrants consideration, and correlation with lipase levels is recommended. 3. Hepatic steatosis. 4. Atherosclerosis. 5. Additional incidental findings, as above.   Electronically Signed   By: Vinnie Langton M.D.   On: 01/03/2014 20:06   US Abdomen Limited Ruq  01/03/2014   CLINICAL DATA:  Abdominal pain and constipation.  EXAM: US ABDOMEN LIMITED - RIGHT UPPER QUADRANT  COMPARISON:  None.  FINDINGS: Gallbladder:  Cholelithiasis with sludge and small stones layering in the gallbladder. No gallbladder wall thickening. Murphy's sign is positive. Changes are compatible with acute cholecystitis in the appropriate clinical setting.  Common bile duct:  Diameter: Not visualized due to body habitus and bowel gas. No intrahepatic bile duct dilatation.  Liver:  Diffusely heterogeneous liver parenchymal echotexture likely representing diffuse fatty infiltration. Portions of the liver are not visualized due to body habitus and shadowing.  IMPRESSION: Cholelithiasis with sludge and stones in the gallbladder and positive Murphy's sign. Changes are compatible with acute cholecystitis in the appropriate clinical setting.   Electronically Signed   By: Lucienne Capers M.D.   On: 01/03/2014 23:55    Medications / Allergies: per chart  Antibiotics: Anti-infectives    Start     Dose/Rate Route Frequency Ordered Stop   01/04/14 0400  piperacillin-tazobactam (ZOSYN) IVPB 3.375 g     3.375 g12.5 mL/hr over 240 Minutes Intravenous 3 times per day 01/04/14 0350     01/04/14 0315  Ampicillin-Sulbactam (UNASYN) 3 g in sodium chloride 0.9 % 100 mL IVPB  Status:  Discontinued     3 g100 mL/hr over 60 Minutes Intravenous  Once 01/04/14 0302 01/04/14 0350      Assessment  Tyrone Cunningham  58 y.o. male        Problem List:  Principal Problem:   Cholecystitis Active Problems:   [name] with frequent exacerbations   CHF (congestive heart failure)   Hypertension   Hypercholesteremia   Tracheostomy dependent   OSA (obstructive sleep apnea)   DM (diabetes mellitus), type 2 with neurological complications   Constipation, chronic   Acute cholecystitis  Acute chole cystitis in medically complicated patient  Plan:  At some point he would benefit from cholecystectomy.  Reasonable to start with laparoscopic approach.  I am concerned given his chronic symptoms at this may be more severe.  Does not look or upon CAT scan.  No evidence of pancreatitis with normal lipase.  CT scan inflammation a soft call at best.  Given his prolonged pain, continue with IV Zosyn for now.  I did discuss with him.  My concern is he has increased operative risk given his severe COPD.  See if pulmonology can evaluate for clearance or tuning up at least this admission.  I did discuss with the patient:  The anatomy & physiology of hepatobiliary & pancreatic function was discussed.  The pathophysiology of gallbladder dysfunction was discussed.  Natural history risks without surgery was discussed.   I feel the risks of no intervention will lead to serious problems that outweigh the operative risks; therefore, I recommended cholecystectomy to remove the pathology.  I explained laparoscopic techniques with possible need for an open approach.  Probable cholangiogram to evaluate the bilary tract was explained as well.    Risks such as bleeding, infection, abscess, leak, injury to other organs, need for further treatment, stroke, heart attack, death, and other risks were discussed.  I noted a good likelihood this will help address the problem.  Possibility that this will not correct all abdominal symptoms was explained.  Goals of post-operative recovery were discussed as well.  We will work to minimize complications.  An  educational handout further explaining the pathology and treatment options was given as well.  Questions were answered.  The patient expresses understanding & wishes to proceed with surgery.  -oxygen and trach care for COPD.  Agree with suggesting diabetic meds.  Elevated hemoglobin A1c is sign of fair control.  Recommend he stay compliant on a bowel regimen with his Miralax.  -VTE prophylaxis- SCDs, etc  -mobilize as tolerated to help recovery    Adin Hector, M.D., F.A.C.S. Gastrointestinal and Minimally Invasive Surgery [facility] [address], [place], [place] [address] [phone] Main / Paging   01/04/2014  Note: Portions of this report may have been transcribed using voice recognition software. Every effort was made to ensure accuracy; however, inadvertent computerized transcription errors may be present.   Any transcriptional errors that result from this process are unintentional.

## 2014-01-04 NOTE — Progress Notes (Signed)
Pt refused his 16:00 neb tx. Pt in no distress at this time. Pt states he wants to rest/sleep.

## 2014-01-04 NOTE — H&P (Signed)
[facility] History and Physical  Tyrone Cunningham ZOX:[id number] DOB: Nov 29, 1955 DOA: 01/03/2014  Referring physician: ED physician PCP: Lucilla Edin, MD  Specialists:   Chief Complaint: Abdominal pain and constipation  HPI: Tyrone Cunningham is a 57 y.o. male past medical history of COPD (trach dependent), CHF, diabetes type 2, hypertension, hyperlipidemia, chronic constipation, who presents with abdominal pain and worsening constipation.  Patient reports that he started having abdominal pain 10 ago. His abdominal pain is located at right upper quadrant, constant, intermittently accentuated. It is aggravated by movement. He does not have nausea, vomiting or diarrhea, but has worsening constipation. He did not have bowel movement in the past 3-4 days. Patient does not have a fever, chills, chest pain. He has chronic cough and shortness of breath due to COPD, which has not changed from his baseline.   Patient was found to have leukocytosis with WBC 12.4, and cholecystitis on abdominal ultrasound. Patient is admitted to inpatient for further evaluation and treatment. Surgery was consulted.  Review of Systems: As presented in the history of presenting illness, rest negative.  Where does patient live?  Lives at home Can patient participate in ADLs? Yes  Allergy: No Known Allergies  Past Medical History  Diagnosis Date  . Bronchitis   . CHF (congestive heart failure)   . DM type 2 with diabetic peripheral neuropathy   . Hypertension   . Hypercholesteremia   . Hearing loss   . Peripheral neuropathy   . Sleep apnea     Severe sleep apnea requiring tracheostomy  . COPD (chronic obstructive pulmonary disease)     Past Surgical History  Procedure Laterality Date  . Tracheostomy    . Appendectomy    . Hand surgery    . Femur surgery    . Multiple extractions with alveoloplasty N/A 05/11/2012    Procedure: MULTIPLE EXTRACTION WITH ALVEOLOPLASTY;  Surgeon: [name], DDS;  Location: [facility]  OR;  Service: Oral Surgery;  Laterality: N/A;  . Femur hardware removal  09/01/2003    Removal of retained hardware, two interlocking distal femoral    Social History:  reports that he quit smoking about 14 years ago. His smoking use included Cigarettes. He has a 60 pack-year smoking history. He has never used smokeless tobacco. He reports that he does not drink alcohol or use illicit drugs.  Family History:  Family History  Problem Relation Age of Onset  . Coronary artery disease Father      Prior to Admission medications   Medication Sig Start Date End Date Taking? Authorizing Provider  albuterol (PROVENTIL HFA;VENTOLIN HFA) 108 (90 BASE) MCG/ACT inhaler Inhale 2 puffs into the lungs every 6 (six) hours as needed for wheezing or shortness of breath (shortness of breath).   Yes Historical Provider, MD  albuterol (PROVENTIL) (2.5 MG/3ML) 0.083% nebulizer solution Take 3 mLs (2.5 mg total) by nebulization 4 (four) times daily. 06/29/12  Yes Storm Frisk, MD  atorvastatin (LIPITOR) 10 MG tablet Take 10 mg by mouth daily.   Yes Historical Provider, MD  Fluticasone-Salmeterol (ADVAIR) 500-50 MCG/DOSE AEPB Inhale 1 puff into the lungs 2 (two) times daily.   Yes Historical Provider, MD  furosemide (LASIX) 40 MG tablet Take 40 mg by mouth daily.   Yes Historical Provider, MD  gabapentin (NEURONTIN) 300 MG capsule Take 3 to 4 tablets at night for pain as needed Patient taking differently: Take 1,200 mg by mouth daily as needed (pain). Take 4 tablets by mouth daily at night as  needed for pain. 06/21/13  Yes Collene GobbleSteven A Daub, MD  GRALISE 600 MG TABS Take 3 tablets by mouth every morning.   Yes Historical Provider, MD  insulin aspart (NOVOLOG) 100 UNIT/ML injection Inject 21 Units into the skin 3 (three) times daily before meals.    Yes Historical Provider, MD  insulin glargine (LANTUS) 100 UNIT/ML injection Inject 90 Units into the skin daily.   Yes Historical Provider, MD  Insulin Syringe-Needle U-100  (B-D INS SYR ULTRAFINE 1CC/31G) 31G X 5/16" 1 ML MISC Use as Directed 12/14/13  Yes Collene GobbleSteven A Daub, MD  lisinopril (PRINIVIL,ZESTRIL) 5 MG tablet Take 5 mg by mouth daily.   Yes Historical Provider, MD  oxyCODONE-acetaminophen (PERCOCET) 10-325 MG per tablet Take 1 tablet by mouth every 6 (six) hours as needed for pain (pain).    Yes Historical Provider, MD  polyethylene glycol (MIRALAX / GLYCOLAX) packet Take 17 g by mouth daily.   Yes Historical Provider, MD  spironolactone (ALDACTONE) 25 MG tablet Take 25 mg by mouth daily.   Yes Historical Provider, MD  tiotropium (SPIRIVA) 18 MCG inhalation capsule Place 18 mcg into inhaler and inhale daily.   Yes Historical Provider, MD  traMADol (ULTRAM) 50 MG tablet Take 100 mg by mouth every 6 (six) hours as needed for severe pain (pain).    Yes Historical Provider, MD  ADVAIR DISKUS 500-50 MCG/DOSE AEPB INHALE 1 PUFF INTO THE LUNGS TWICE DAILY    Collene GobbleSteven A Daub, MD  atorvastatin (LIPITOR) 10 MG tablet TAKE ONE TABLET BY MOUTH DAILY 11/02/13   Collene GobbleSteven A Daub, MD  furosemide (LASIX) 40 MG tablet TAKE 1 TABLET BY MOUTH TWICE DAILY    Collene GobbleSteven A Daub, MD  insulin glargine (LANTUS) 100 UNIT/ML injection INJECT 90 UNITS UNDER THE SKIN EVERY DAY AS DIRECTED 12/14/13   Collene GobbleSteven A Daub, MD  lisinopril (PRINIVIL,ZESTRIL) 5 MG tablet TAKE 1 TABLET BY MOUTH DAILY 12/05/13   Collene GobbleSteven A Daub, MD  NOVOLIN R 100 UNIT/ML injection USE 21 UNITS FOUR TIMES DAILY BEFORE MEALS PER SLIDING SCALE    Collene GobbleSteven A Daub, MD  polyethylene glycol powder (GLYCOLAX/MIRALAX) powder MIX 17 GRAMS(ONE CAPFUL) IN LIQUID OF CHOICE AND DRINK DAILY    Collene GobbleSteven A Daub, MD  PROAIR HFA 108 (90 BASE) MCG/ACT inhaler INHALE 2 PUFFS EVERY 4 TO 6 HOURS AS NEEDED 10/03/13   Collene GobbleSteven A Daub, MD  spironolactone (ALDACTONE) 25 MG tablet TAKE 1 TABLET BY MOUTH EVERY DAY 10/12/13   Collene GobbleSteven A Daub, MD  tiotropium (SPIRIVA HANDIHALER) 18 MCG inhalation capsule INHALE 1 CAPSULE IN HANDIHALER EVERY DAY AS DIRECTED 12/23/13   Morrell RiddleSarah L  Weber, PA-C  VOLTAREN 1 % GEL Apply 2 g topically daily as needed (hand pain).     Historical Provider, MD    Physical Exam: Filed Vitals:   01/03/14 2004 01/03/14 2105 01/03/14 2337 01/04/14 0153  BP:  119/72 143/83 152/82  Pulse:  89 89 89  Temp:      TempSrc:      Resp:  20 20 20   SpO2: 92% 99% 100% 100%   General: Not in acute distress HEENT:       Eyes: PERRL, EOMI, no scleral icterus       ENT: No discharge from the ears and nose, no pharynx injection, no tonsillar enlargement.        Neck: No JVD, no bruit, no mass felt.Trach placed without complication  Cardiac: S1/S2, RRR, No murmurs, gallops or rubs Pulm: Good air movement bilaterally. Clear  to auscultation bilaterally. No rales, wheezing, rhonchi or rubs. Abd: no distension. There is tenderness in the right upper quadrant, suprapubic area and left lower quadrant. No rebound pain. hypoactive bowel sounds  Ext: No edema. 2+DP/PT pulse bilaterally Musculoskeletal: No joint deformities, erythema, or stiffness, ROM full Skin: No rashes.  Neuro: Alert and oriented X3, cranial nerves II-XII grossly intact, muscle strength 5/5 in all extremeties, sensation to light touch intact.  Psych: Patient is not psychotic, no suicidal or hemocidal ideation.  Recent Labs Lab 01/03/14 1738  AST 17  ALT 18  ALKPHOS 86  BILITOT 0.3  PROT 7.7  ALBUMIN 2.8*    Recent Labs Lab 01/03/14 1738  LIPASE 35   No results for input(s): AMMONIA in the last 168 hours. CBC:  Recent Labs Lab 01/03/14 1502 01/03/14 1738  WBC 14.4* 12.4*  NEUTROABS  --  10.0*  HGB 17.5 17.2*  HCT 55.4* 52.8*  MCV 84.7 83.9  PLT  --  180   Cardiac Enzymes: No results for input(s): CKTOTAL, CKMB, CKMBINDEX, TROPONINI in the last 168 hours.  BNP (last 3 results) No results for input(s): PROBNP in the last 8760 hours. CBG:  Recent Labs Lab 01/03/14 2111  GLUCAP 79    Radiological Exams on Admission: Ct Abdomen Pelvis W Contrast  01/03/2014    CLINICAL DATA:  58 year old male with right upper quadrant abdominal pain for the past 4 days, with some associated shortness of breath.  EXAM: CT ABDOMEN AND PELVIS WITH CONTRAST  TECHNIQUE: Multidetector CT imaging of the abdomen and pelvis was performed using the standard protocol following bolus administration of intravenous contrast.  CONTRAST:  100mL OMNIPAQUE IOHEXOL 300 MG/ML  SOLN  COMPARISON:  CT of the abdomen and pelvis 03/09/2011.  FINDINGS: Lower chest:  Mild cardiomegaly.  Mild bibasilar scarring.  Hepatobiliary: Large amount of amorphous high attenuation material layering dependently in the gallbladder, presumably biliary sludge or noncalcified gallstones. Gallbladder is moderately distended. However, the gallbladder wall does not appear thickened, and there is no pericholecystic fluid or inflammatory changes. Diffuse decreased attenuation throughout the hepatic parenchyma, compatible with hepatic steatosis. No focal cystic or solid hepatic lesions. No intra or extrahepatic biliary ductal dilatation.  Pancreas: Very mild haziness adjacent to the pancreatic head. The appearance of the pancreas is otherwise unremarkable.  Spleen: Unremarkable.  Adrenals/Urinary Tract: Bilateral adrenal glands are normal in appearance. Bilateral kidneys are also normal in appearance mild bilateral perinephric stranding (nonspecific). No hydroureteronephrosis to indicate urinary tract obstruction at this time. Urinary bladder is unremarkable in appearance.  Stomach/Bowel: The stomach is normal in appearance. No pathologic dilatation of small bowel or colon.  Vascular/Lymphatic: Extensive atherosclerosis throughout the abdominal and pelvic vasculature, without evidence of aneurysm or dissection. No pathologically enlarged lymph nodes are noted in the abdomen or pelvis.  Reproductive: Prostate gland is unremarkable in appearance.  Other: No significant volume of ascites.  No pneumoperitoneum.  Musculoskeletal: There are no  aggressive appearing lytic or blastic lesions noted in the visualized portions of the skeleton. Status post ORIF in the left femoral neck.  IMPRESSION: 1. Large amount of biliary sludge and/or small noncalcified gallstones lying dependently in the gallbladder. No definite findings to suggest acute cholecystitis at this time. 2. There is very mild haziness in the peripancreatic fat adjacent to the pancreatic head. The possibility of mild pancreatitis warrants consideration, and correlation with lipase levels is recommended. 3. Hepatic steatosis. 4. Atherosclerosis. 5. Additional incidental findings, as above.   Electronically Signed   By: Reuel Boomaniel  Entrikin M.D.   On: 01/03/2014 20:06   US Abdomen Limited Ruq  01/03/2014   CLINICAL DATA:  Abdominal pain and constipation.  EXAM: US ABDOMEN LIMITED - RIGHT UPPER QUADRANT  COMPARISON:  None.  FINDINGS: Gallbladder:  Cholelithiasis with sludge and small stones layering in the gallbladder. No gallbladder wall thickening. Murphy's sign is positive. Changes are compatible with acute cholecystitis in the appropriate clinical setting.  Common bile duct:  Diameter: Not visualized due to body habitus and bowel gas. No intrahepatic bile duct dilatation.  Liver:  Diffusely heterogeneous liver parenchymal echotexture likely representing diffuse fatty infiltration. Portions of the liver are not visualized due to body habitus and shadowing.  IMPRESSION: Cholelithiasis with sludge and stones in the gallbladder and positive Murphy's sign. Changes are compatible with acute cholecystitis in the appropriate clinical setting.   Electronically Signed   By: Burman Nieves M.D.   On: 01/03/2014 23:55    Assessment/Plan Principal Problem:   Cholecystitis Active Problems:   [name] with frequent exacerbations   CHF (congestive heart failure)   Hypertension   Hypercholesteremia   Tracheostomy dependent   OSA (obstructive sleep apnea)   DM (diabetes mellitus), type 2 with  neurological complications   Constipation, chronic  Cholecystitis: as evidenced by abdominal ultrasound. Currently patient is hemodynamically stable, not septic. Surgeon was consulted, possible surgery tomorrow. - will admit to med-surg bed - NPO - IV zosyn - check INR/PTT - EKG - no IVF given his hx of CHF, but will hold lasix - pain control - Zofran for nausea - follow up surgeon's recs  COPD: trach-dependent. Stable at baseline. Lung auscultation is clear bilaterally. - Continue breathing treatment - consult to RT for trach management  DM-II: A1c 9.1. He is on Lantus 90 units daily at home - will decrease Lantus to 60 units daily while on NPO - SSI  Diastolic CHF: 2-D echo on 10/17/11 showed grade 1 diastolic dysfunction. Patient is euvolemic on admission. He was on Lasix at home. - Will hold Lasix In the setting of acute cholecystitis - continue spironolactone - check ProBNP in AM  Constipation: MiraLAX  HTN: continue lisinopril  DVT ppx: SQ Heparin    Code Status: Full code Family Communication: None at bed side.  Disposition Plan: Admit to inpatient   Date of Service 01/04/2014    Lorretta Harp [facility] Pager [phone]  If 7PM-7AM, please contact night-coverage [email or web address] Password [email or web address] 01/04/2014, 2:02 AM

## 2014-01-04 NOTE — Telephone Encounter (Signed)
Lm on Daria VM- Vitals - 1 value per visit 01/04/2014  Weight (lb) 342.6  Height 5\' 7" 

## 2014-01-04 NOTE — Telephone Encounter (Signed)
Daria from [facility] requesting patients height and weight. She needs it for him to get his Shower Chair. Please call her at [phone] ext #[phone] °

## 2014-01-04 NOTE — Consult Note (Signed)
Name: Tyrone BaileyMark Cunningham MRN: [id number] DOB: 09/13/1955    ADMISSION DATE:  01/03/2014 CONSULTATION DATE:  11/4  REFERRING MD :  Michaell CowingGross  CHIEF COMPLAINT:  Chronic Respiratory failure/ pre-op for Lap Chole   BRIEF PATIENT DESCRIPTION:  This is a 58 year old male f/b PW for chronic respiratory failure in setting of [name]. Also chronic trach dependent due to OSA. PCCM asked to see pre-op for pre-op clearance on 11/4 for Cholecystectomy.   SIGNIFICANT EVENTS  11/4: admitted w/ abd pain d/t acute Cholecystitis  11/4: PCCM asked to see pre-op. #7 metal Jean RosenthalJackson changed to 6 cuffed shiley for surgery   STUDIES:  11/3 US abd: Cholelithiasis with sludge and stones in the gallbladder and positive Murphy's sign. Changes are compatible with acute cholecystitis in the appropriate clinical setting. 11/3 CT abd: 1. Large amount of biliary sludge and/or small noncalcified gallstones lying dependently in the gallbladder. No definite findings to suggest acute cholecystitis at this time.2. There is very mild haziness in the peripancreatic fat adjacent to the pancreatic head.   HISTORY OF PRESENT ILLNESS:   This is a 58 year old male f/b PW for chronic respiratory failure in setting of [name]. Also chronic trach dependent due to OSA. Presented 11/3 w/ CC: abd pain and constipation. Dx eval c/w acute cholecystitis. Seen by surgery who asked for pre-op pulm eval.     PAST MEDICAL HISTORY :   has a past medical history of Bronchitis; CHF (congestive heart failure); DM type 2 with diabetic peripheral neuropathy; Hypertension; Hypercholesteremia; Hearing loss; Peripheral neuropathy; Sleep apnea; and COPD (chronic obstructive pulmonary disease).  has past surgical history that includes Tracheostomy; Appendectomy; Hand surgery; Femur Surgery; Multiple extractions with alveoloplasty (N/A, 05/11/2012); and Femur hardware removal (09/01/2003). Prior to Admission medications   Medication Sig Start Date End Date  Taking? Authorizing Provider  albuterol (PROVENTIL HFA;VENTOLIN HFA) 108 (90 BASE) MCG/ACT inhaler Inhale 2 puffs into the lungs every 6 (six) hours as needed for wheezing or shortness of breath (shortness of breath).   Yes Historical Provider, MD  albuterol (PROVENTIL) (2.5 MG/3ML) 0.083% nebulizer solution Take 3 mLs (2.5 mg total) by nebulization 4 (four) times daily. 06/29/12  Yes Storm FriskPatrick E Wright, MD  atorvastatin (LIPITOR) 10 MG tablet Take 10 mg by mouth daily.   Yes Historical Provider, MD  Fluticasone-Salmeterol (ADVAIR) 500-50 MCG/DOSE AEPB Inhale 1 puff into the lungs 2 (two) times daily.   Yes Historical Provider, MD  furosemide (LASIX) 40 MG tablet Take 40 mg by mouth daily.   Yes Historical Provider, MD  gabapentin (NEURONTIN) 300 MG capsule Take 3 to 4 tablets at night for pain as needed Patient taking differently: Take 1,200 mg by mouth daily as needed (pain). Take 4 tablets by mouth daily at night as needed for pain. 06/21/13  Yes Collene GobbleSteven A Daub, MD  GRALISE 600 MG TABS Take 3 tablets by mouth every morning.   Yes Historical Provider, MD  insulin aspart (NOVOLOG) 100 UNIT/ML injection Inject 21 Units into the skin 3 (three) times daily before meals.    Yes Historical Provider, MD  insulin glargine (LANTUS) 100 UNIT/ML injection Inject 90 Units into the skin daily.   Yes Historical Provider, MD  Insulin Syringe-Needle U-100 (B-D INS SYR ULTRAFINE 1CC/31G) 31G X 5/16" 1 ML MISC Use as Directed 12/14/13  Yes Collene GobbleSteven A Daub, MD  lisinopril (PRINIVIL,ZESTRIL) 5 MG tablet Take 5 mg by mouth daily.   Yes Historical Provider, MD  oxyCODONE-acetaminophen (PERCOCET)  10-325 MG per tablet Take 1 tablet by mouth every 6 (six) hours as needed for pain (pain).    Yes Historical Provider, MD  polyethylene glycol (MIRALAX / GLYCOLAX) packet Take 17 g by mouth daily.   Yes Historical Provider, MD  spironolactone (ALDACTONE) 25 MG tablet Take 25 mg by mouth daily.   Yes Historical Provider, MD  tiotropium  (SPIRIVA) 18 MCG inhalation capsule Place 18 mcg into inhaler and inhale daily.   Yes Historical Provider, MD  traMADol (ULTRAM) 50 MG tablet Take 100 mg by mouth every 6 (six) hours as needed for severe pain (pain).    Yes Historical Provider, MD  ADVAIR DISKUS 500-50 MCG/DOSE AEPB INHALE 1 PUFF INTO THE LUNGS TWICE DAILY    Collene GobbleSteven A Daub, MD  atorvastatin (LIPITOR) 10 MG tablet TAKE ONE TABLET BY MOUTH DAILY 11/02/13   Collene GobbleSteven A Daub, MD  furosemide (LASIX) 40 MG tablet TAKE 1 TABLET BY MOUTH TWICE DAILY    Collene GobbleSteven A Daub, MD  insulin glargine (LANTUS) 100 UNIT/ML injection INJECT 90 UNITS UNDER THE SKIN EVERY DAY AS DIRECTED 12/14/13   Collene GobbleSteven A Daub, MD  lisinopril (PRINIVIL,ZESTRIL) 5 MG tablet TAKE 1 TABLET BY MOUTH DAILY 12/05/13   Collene GobbleSteven A Daub, MD  NOVOLIN R 100 UNIT/ML injection USE 21 UNITS FOUR TIMES DAILY BEFORE MEALS PER SLIDING SCALE    Collene GobbleSteven A Daub, MD  polyethylene glycol powder (GLYCOLAX/MIRALAX) powder MIX 17 GRAMS(ONE CAPFUL) IN LIQUID OF CHOICE AND DRINK DAILY    Collene GobbleSteven A Daub, MD  PROAIR HFA 108 (90 BASE) MCG/ACT inhaler INHALE 2 PUFFS EVERY 4 TO 6 HOURS AS NEEDED 10/03/13   Collene GobbleSteven A Daub, MD  spironolactone (ALDACTONE) 25 MG tablet TAKE 1 TABLET BY MOUTH EVERY DAY 10/12/13   Collene GobbleSteven A Daub, MD  tiotropium (SPIRIVA HANDIHALER) 18 MCG inhalation capsule INHALE 1 CAPSULE IN HANDIHALER EVERY DAY AS DIRECTED 12/23/13   Morrell RiddleSarah L Weber, PA-C  VOLTAREN 1 % GEL Apply 2 g topically daily as needed (hand pain).     Historical Provider, MD   No Known Allergies  FAMILY HISTORY:  family history includes Coronary artery disease in his father. SOCIAL HISTORY:  reports that he quit smoking about 14 years ago. His smoking use included Cigarettes. He has a 60 pack-year smoking history. He has never used smokeless tobacco. He reports that he does not drink alcohol or use illicit drugs.  REVIEW OF SYSTEMS:   Constitutional: Negative for fever, chills, weight loss, malaise/fatigue and diaphoresis.    HENT: Negative for hearing loss, ear pain, nosebleeds, congestion, sore throat, neck pain, tinnitus and ear discharge.   Eyes: Negative for blurred vision, double vision, photophobia, pain, discharge and redness.  Respiratory: Negative for cough, hemoptysis, sputum production, shortness of breath, wheezing and stridor.   Cardiovascular: Negative for chest pain, palpitations, orthopnea, claudication, leg swelling and PND.  Gastrointestinal: Negative for heartburn, nausea, vomiting, abdominal pain, diarrhea, constipation, blood in stool and melena.  Genitourinary: Negative for dysuria, urgency, frequency, hematuria and flank pain.  Musculoskeletal: Negative for myalgias, back pain, joint pain and falls.  Skin: Negative for itching and rash.  Neurological: Negative for dizziness, tingling, tremors, sensory change, speech change, focal weakness, seizures, loss of consciousness, weakness and headaches.  Endo/Heme/Allergies: Negative for environmental allergies and polydipsia. Does not bruise/bleed easily.  SUBJECTIVE:   VITAL SIGNS: Temp:  [98.9 F (37.2 C)-99.4 F (37.4 C)] 99.1 F (37.3 C) (11/04 0628) Pulse Rate:  [84-108] 84 (11/04 0958) Resp:  [18-22] 20 (  11/04 0958) BP: (119-155)/(59-88) 131/59 mmHg (11/04 0628) SpO2:  [90 %-100 %] 97 % (11/04 0958) FiO2 (%):  [35 %] 35 % (11/04 0958) Weight:  [155.4 kg (342 lb 9.5 oz)] 155.4 kg (342 lb 9.5 oz) (11/04 0338)  PHYSICAL EXAMINATION: General:  Obese white male, sitting up in chair. No distress Neuro:  Awake, alert, anxious but no focal def  HEENT:  [place], unable to assess JVD. Had 7 metal Jean Rosenthal, have changed him to 6 cuffed. Has some residual blood from trach change trauma. Still able to phonate  Cardiovascular:  rrr Lungs:  Clear  Abdomen:  Obese, + bowel sounds  Musculoskeletal:  Intact  Skin:  Dry, intact    Recent Labs Lab 01/03/14 1738 01/04/14 0750  NA 133* 135*  K 4.4 4.9  CL 92* 94*  CO2 30 29  BUN 17 14  CREATININE  0.71 0.68  GLUCOSE 100* 74    Recent Labs Lab 01/03/14 1502 01/03/14 1738 01/04/14 0750  HGB 17.5 17.2* 17.0  HCT 55.4* 52.8* 53.3*  WBC 14.4* 12.4* 9.4  PLT  --  180 158   Ct Abdomen Pelvis W Contrast  01/03/2014   CLINICAL DATA:  58 year old male with right upper quadrant abdominal pain for the past 4 days, with some associated shortness of breath.  EXAM: CT ABDOMEN AND PELVIS WITH CONTRAST  TECHNIQUE: Multidetector CT imaging of the abdomen and pelvis was performed using the standard protocol following bolus administration of intravenous contrast.  CONTRAST:  OMNIPAQUE IOHEXOL 300 MG/ML  SOLN  COMPARISON:  CT of the abdomen and pelvis 03/09/2011.  FINDINGS: Lower chest:  Mild cardiomegaly.  Mild bibasilar scarring.  Hepatobiliary: Large amount of amorphous high attenuation material layering dependently in the gallbladder, presumably biliary sludge or noncalcified gallstones. Gallbladder is moderately distended. However, the gallbladder wall does not appear thickened, and there is no pericholecystic fluid or inflammatory changes. Diffuse decreased attenuation throughout the hepatic parenchyma, compatible with hepatic steatosis. No focal cystic or solid hepatic lesions. No intra or extrahepatic biliary ductal dilatation.  Pancreas: Very mild haziness adjacent to the pancreatic head. The appearance of the pancreas is otherwise unremarkable.  Spleen: Unremarkable.  Adrenals/Urinary Tract: Bilateral adrenal glands are normal in appearance. Bilateral kidneys are also normal in appearance mild bilateral perinephric stranding (nonspecific). No hydroureteronephrosis to indicate urinary tract obstruction at this time. Urinary bladder is unremarkable in appearance.  Stomach/Bowel: The stomach is normal in appearance. No pathologic dilatation of small bowel or colon.  Vascular/Lymphatic: Extensive atherosclerosis throughout the abdominal and pelvic vasculature, without evidence of aneurysm or dissection.  No pathologically enlarged lymph nodes are noted in the abdomen or pelvis.  Reproductive: Prostate gland is unremarkable in appearance.  Other: No significant volume of ascites.  No pneumoperitoneum.  Musculoskeletal: There are no aggressive appearing lytic or blastic lesions noted in the visualized portions of the skeleton. Status post ORIF in the left femoral neck.  IMPRESSION: 1. Large amount of biliary sludge and/or small noncalcified gallstones lying dependently in the gallbladder. No definite findings to suggest acute cholecystitis at this time. 2. There is very mild haziness in the peripancreatic fat adjacent to the pancreatic head. The possibility of mild pancreatitis warrants consideration, and correlation with lipase levels is recommended. 3. Hepatic steatosis. 4. Atherosclerosis. 5. Additional incidental findings, as above.   Electronically Signed   By: Trudie Reed M.D.   On: 01/03/2014 20:06   US Abdomen Limited Ruq  01/03/2014   CLINICAL DATA:  Abdominal pain and constipation.  EXAM: US ABDOMEN LIMITED - RIGHT UPPER QUADRANT  COMPARISON:  None.  FINDINGS: Gallbladder:  Cholelithiasis with sludge and small stones layering in the gallbladder. No gallbladder wall thickening. Murphy's sign is positive. Changes are compatible with acute cholecystitis in the appropriate clinical setting.  Common bile duct:  Diameter: Not visualized due to body habitus and bowel gas. No intrahepatic bile duct dilatation.  Liver:  Diffusely heterogeneous liver parenchymal echotexture likely representing diffuse fatty infiltration. Portions of the liver are not visualized due to body habitus and shadowing.  IMPRESSION: Cholelithiasis with sludge and stones in the gallbladder and positive Murphy's sign. Changes are compatible with acute cholecystitis in the appropriate clinical setting.   Electronically Signed   By: Burman Nieves M.D.   On: 01/03/2014 23:55    ASSESSMENT / PLAN:  Chronic respiratory failure  GOLD  D COPD OSA Tracheostomy status Acute Cholecystitis   Discussion His respiratory status is at baseline. He is at high risk for prolonged ventilator dependence, and difficulty weaning but these are not absolute contraindications. The fact that he is already tracheostomy dependant actually makes his pulmonary status a little more stable than if he were not. I have changed him to a 6 cuffed trach so that he can be ventilated during general anesthesia.   Plan Ok from [place] stand-point for surgery as long as he accepts the risk for prolonged vent dependence (we have discussed this) Continue bronchodilators (will switch to Brovana/budesonide) Would recover him in the stepdown unit post-op  Once he his off vent and stable we will change him back to 7 metal jackson trach.    Anders Simmonds ACNP-BC [facility] Pulmonary/Critical Care Pager # [phone] OR # [phone] if no answer  ATTENDING NOTE: Patient with tracheostomy due to sleep apnea.  Will need a cholecystectomy.  Trach in place is cuffless.  Will change to a cuffed tracheostomy, six shiley from cuffless 7 metal trach inorder to facilitate vent use during surgery.  Would recommend recovering in the SDU/ICU post op then will exchange trach to cuffless post-op.  Alyson Reedy, M.D. [facility] Pulmonary/Critical Care Medicine. Pager: [phone]. After hours pager: [phone].  01/04/2014, 10:54 AM

## 2014-01-04 NOTE — Telephone Encounter (Signed)
Daria from [facility] requesting patients height and weight. She needs it for him to get his Paediatric nursehower Chair. Please call her at [phone] ext [phone]

## 2014-01-04 NOTE — Progress Notes (Signed)
ANTIBIOTIC CONSULT NOTE - INITIAL  Pharmacy Consult for zosyn Indication: Intra-abdominal infection  No Known Allergies  Patient Measurements: Height: 5\' 7"  (170.2 cm) Weight: (!) 342 lb 9.5 oz (155.4 kg) IBW/kg (Calculated) : 66.1 Adjusted Body Weight:   Vital Signs: Temp: 99.1 F (37.3 C) (11/04 0628) Temp Source: Oral (11/04 0628) BP: 131/59 mmHg (11/04 0628) Pulse Rate: 95 (11/04 0628) Intake/Output from previous day: 11/03 0701 - 11/04 0700 In: -  Out: 300 [Urine:300] Intake/Output from this shift: Total I/O In: -  Out: 300 [Urine:300]  Labs:  Recent Labs  01/03/14 1502 01/03/14 1738  WBC 14.4* 12.4*  HGB 17.5 17.2*  PLT  --  180  CREATININE  --  0.71   Estimated Creatinine Clearance: 141.4 mL/min (by C-G formula based on Cr of 0.71). No results for input(s): VANCOTROUGH, VANCOPEAK, VANCORANDOM, GENTTROUGH, GENTPEAK, GENTRANDOM, TOBRATROUGH, TOBRAPEAK, TOBRARND, AMIKACINPEAK, AMIKACINTROU, AMIKACIN in the last 72 hours.   Microbiology: Recent Results (from the past 720 hour(s))  Surgical PCR screen     Status: None   Collection Time: 01/04/14  3:41 AM  Result Value Ref Range Status   MRSA, PCR NEGATIVE NEGATIVE Final   Staphylococcus aureus NEGATIVE NEGATIVE Final    Comment:        The Xpert SA Assay (FDA approved for NASAL specimens in patients over 60 years of age), is one component of a comprehensive surveillance program.  Test performance has been validated by [facility] for patients greater than or equal to 60 year old. It is not intended to diagnose infection nor to guide or monitor treatment.     Medical History: Past Medical History  Diagnosis Date  . Bronchitis   . CHF (congestive heart failure)   . DM type 2 with diabetic peripheral neuropathy   . Hypertension   . Hypercholesteremia   . Hearing loss   . Peripheral neuropathy   . Sleep apnea     Severe sleep apnea requiring tracheostomy  . COPD (chronic obstructive pulmonary  disease)     Medications:  Anti-infectives    Start     Dose/Rate Route Frequency Ordered Stop   01/04/14 0400  piperacillin-tazobactam (ZOSYN) IVPB 3.375 g     3.375 g12.5 mL/hr over 240 Minutes Intravenous 3 times per day 01/04/14 0350     01/04/14 0315  Ampicillin-Sulbactam (UNASYN) 3 g in sodium chloride 0.9 % 100 mL IVPB  Status:  Discontinued     3 g100 mL/hr over 60 Minutes Intravenous  Once 01/04/14 0302 01/04/14 0350     Assessment: Patient with intra-abdominal infection.  Goal of Therapy:  Zosyn based on renal function   Plan:  Zosyn 3.375g IV Q8H infused over 4hrs.   Darlina GuysGrimsley Jr, Jacquenette ShoneJulian Crowford 01/04/2014,7:00 AM

## 2014-01-04 NOTE — Plan of Care (Signed)
Problem: Phase I Progression Outcomes Goal: Hemodynamically stable Outcome: Completed/Met Date Met:  01/04/14     

## 2014-01-05 ENCOUNTER — Encounter (HOSPITAL_COMMUNITY)
Admission: EM | Disposition: A | Discharge status: Home Health | Payer: Self-pay | Source: Home / Self Care | Attending: Internal Medicine

## 2014-01-05 ENCOUNTER — Inpatient Hospital Stay (HOSPITAL_COMMUNITY): Payer: Medicare Other | Admitting: Anesthesiology

## 2014-01-05 ENCOUNTER — Encounter (HOSPITAL_COMMUNITY): Payer: Self-pay

## 2014-01-05 DIAGNOSIS — R109 Unspecified abdominal pain: Secondary | ICD-10-CM | POA: Diagnosis present

## 2014-01-05 DIAGNOSIS — R1011 Right upper quadrant pain: Secondary | ICD-10-CM

## 2014-01-05 DIAGNOSIS — J9620 Acute and chronic respiratory failure, unspecified whether with hypoxia or hypercapnia: Secondary | ICD-10-CM

## 2014-01-05 HISTORY — PX: CHOLECYSTECTOMY: SHX55

## 2014-01-05 LAB — GLUCOSE, CAPILLARY
GLUCOSE-CAPILLARY: 217 mg/dL — AB (ref 70–99)
Glucose-Capillary: 142 mg/dL — ABNORMAL HIGH (ref 70–99)
Glucose-Capillary: 159 mg/dL — ABNORMAL HIGH (ref 70–99)
Glucose-Capillary: 203 mg/dL — ABNORMAL HIGH (ref 70–99)
Glucose-Capillary: 324 mg/dL — ABNORMAL HIGH (ref 70–99)

## 2014-01-05 SURGERY — LAPAROSCOPIC CHOLECYSTECTOMY
Anesthesia: General

## 2014-01-05 MED ORDER — ALBUTEROL SULFATE (2.5 MG/3ML) 0.083% IN NEBU
INHALATION_SOLUTION | RESPIRATORY_TRACT | Status: AC
  Administered 2014-01-05: 2.5 mg
  Filled 2014-01-05: qty 3

## 2014-01-05 MED ORDER — ACETAMINOPHEN 10 MG/ML IV SOLN
1000.0000 mg | Freq: Once | INTRAVENOUS | Status: AC
  Administered 2014-01-05: 1000 mg via INTRAVENOUS
  Filled 2014-01-05: qty 100

## 2014-01-05 MED ORDER — FENTANYL CITRATE 0.05 MG/ML IJ SOLN
INTRAMUSCULAR | Status: DC | PRN
  Administered 2014-01-05 (×5): 50 ug via INTRAVENOUS

## 2014-01-05 MED ORDER — LACTATED RINGERS IV SOLN
INTRAVENOUS | Status: DC | PRN
  Administered 2014-01-05: 11:00:00 via INTRAVENOUS

## 2014-01-05 MED ORDER — ROCURONIUM BROMIDE 100 MG/10ML IV SOLN
INTRAVENOUS | Status: AC
  Filled 2014-01-05: qty 1

## 2014-01-05 MED ORDER — PHENYLEPHRINE HCL 10 MG/ML IJ SOLN
20.0000 mg | INTRAVENOUS | Status: DC | PRN
  Administered 2014-01-05: 50 ug/min via INTRAVENOUS

## 2014-01-05 MED ORDER — GLYCOPYRROLATE 0.2 MG/ML IJ SOLN
INTRAMUSCULAR | Status: DC | PRN
  Administered 2014-01-05: 0.6 mg via INTRAVENOUS

## 2014-01-05 MED ORDER — INSULIN GLARGINE 100 UNIT/ML ~~LOC~~ SOLN
60.0000 [IU] | Freq: Every day | SUBCUTANEOUS | Status: DC
  Administered 2014-01-05: 60 [IU] via SUBCUTANEOUS
  Filled 2014-01-05: qty 0.6

## 2014-01-05 MED ORDER — INSULIN ASPART 100 UNIT/ML ~~LOC~~ SOLN
0.0000 [IU] | Freq: Every day | SUBCUTANEOUS | Status: DC
Start: 2014-01-05 — End: 2014-01-07
  Administered 2014-01-05: 4 [IU] via SUBCUTANEOUS
  Administered 2014-01-06: 2 [IU] via SUBCUTANEOUS

## 2014-01-05 MED ORDER — INSULIN ASPART 100 UNIT/ML ~~LOC~~ SOLN
0.0000 [IU] | Freq: Three times a day (TID) | SUBCUTANEOUS | Status: DC
  Administered 2014-01-06: 8 [IU] via SUBCUTANEOUS
  Administered 2014-01-06: 3 [IU] via SUBCUTANEOUS
  Administered 2014-01-06: 5 [IU] via SUBCUTANEOUS
  Administered 2014-01-07: 3 [IU] via SUBCUTANEOUS
  Administered 2014-01-07: 2 [IU] via SUBCUTANEOUS

## 2014-01-05 MED ORDER — FENTANYL CITRATE 0.05 MG/ML IJ SOLN
INTRAMUSCULAR | Status: AC
  Filled 2014-01-05: qty 5

## 2014-01-05 MED ORDER — MIDAZOLAM HCL 5 MG/5ML IJ SOLN
INTRAMUSCULAR | Status: DC | PRN
Start: 2014-01-05 — End: 2014-01-05
  Administered 2014-01-05 (×2): 1 mg via INTRAVENOUS

## 2014-01-05 MED ORDER — BUPIVACAINE-EPINEPHRINE (PF) 0.25% -1:200000 IJ SOLN
INTRAMUSCULAR | Status: AC
  Filled 2014-01-05: qty 30

## 2014-01-05 MED ORDER — LACTATED RINGERS IV SOLN
INTRAVENOUS | Status: DC
  Administered 2014-01-05: 1000 mL via INTRAVENOUS

## 2014-01-05 MED ORDER — EPHEDRINE SULFATE 50 MG/ML IJ SOLN
INTRAMUSCULAR | Status: DC | PRN
  Administered 2014-01-05: 15 mg via INTRAVENOUS
  Administered 2014-01-05 (×2): 10 mg via INTRAVENOUS

## 2014-01-05 MED ORDER — HYDROMORPHONE HCL 1 MG/ML IJ SOLN
INTRAMUSCULAR | Status: AC
  Filled 2014-01-05: qty 1

## 2014-01-05 MED ORDER — HYDROGEN PEROXIDE 3 % EX SOLN
CUTANEOUS | Status: AC
  Filled 2014-01-05: qty 473

## 2014-01-05 MED ORDER — PROMETHAZINE HCL 25 MG/ML IJ SOLN: 6.2500 mg | INTRAMUSCULAR | Status: DC | PRN

## 2014-01-05 MED ORDER — PHENYLEPHRINE HCL 10 MG/ML IJ SOLN
INTRAMUSCULAR | Status: DC | PRN
  Administered 2014-01-05 (×2): 120 ug via INTRAVENOUS
  Administered 2014-01-05: 80 ug via INTRAVENOUS

## 2014-01-05 MED ORDER — ROCURONIUM BROMIDE 100 MG/10ML IV SOLN
INTRAVENOUS | Status: DC | PRN
  Administered 2014-01-05: 10 mg via INTRAVENOUS
  Administered 2014-01-05: 20 mg via INTRAVENOUS
  Administered 2014-01-05: 50 mg via INTRAVENOUS

## 2014-01-05 MED ORDER — HYDROMORPHONE HCL 1 MG/ML IJ SOLN
0.2500 mg | INTRAMUSCULAR | Status: DC | PRN
  Administered 2014-01-05 (×4): 0.5 mg via INTRAVENOUS

## 2014-01-05 MED ORDER — METOCLOPRAMIDE HCL 5 MG/ML IJ SOLN
INTRAMUSCULAR | Status: AC
  Filled 2014-01-05: qty 2

## 2014-01-05 MED ORDER — INSULIN GLARGINE 100 UNIT/ML ~~LOC~~ SOLN: 60.0000 [IU] | Freq: Every day | SUBCUTANEOUS | Status: DC

## 2014-01-05 MED ORDER — DEXAMETHASONE SODIUM PHOSPHATE 10 MG/ML IJ SOLN
INTRAMUSCULAR | Status: AC
  Filled 2014-01-05: qty 1

## 2014-01-05 MED ORDER — PROPOFOL 10 MG/ML IV BOLUS
INTRAVENOUS | Status: AC
  Filled 2014-01-05: qty 20

## 2014-01-05 MED ORDER — 0.9 % SODIUM CHLORIDE (POUR BTL) OPTIME
TOPICAL | Status: DC | PRN
  Administered 2014-01-05: 1000 mL

## 2014-01-05 MED ORDER — NEOSTIGMINE METHYLSULFATE 10 MG/10ML IV SOLN
INTRAVENOUS | Status: AC
  Filled 2014-01-05: qty 1

## 2014-01-05 MED ORDER — GLYCOPYRROLATE 0.2 MG/ML IJ SOLN
INTRAMUSCULAR | Status: AC
  Filled 2014-01-05: qty 5

## 2014-01-05 MED ORDER — PHENYLEPHRINE HCL 10 MG/ML IJ SOLN
INTRAMUSCULAR | Status: AC
  Filled 2014-01-05: qty 2

## 2014-01-05 MED ORDER — MEPERIDINE HCL 50 MG/ML IJ SOLN
6.2500 mg | INTRAMUSCULAR | Status: DC | PRN
Start: 2014-01-05 — End: 2014-01-05

## 2014-01-05 MED ORDER — DEXAMETHASONE SODIUM PHOSPHATE 4 MG/ML IJ SOLN
INTRAMUSCULAR | Status: DC | PRN
  Administered 2014-01-05: 10 mg via INTRAVENOUS

## 2014-01-05 MED ORDER — ONDANSETRON HCL 4 MG/2ML IJ SOLN
INTRAMUSCULAR | Status: DC | PRN
  Administered 2014-01-05: 4 mg via INTRAVENOUS

## 2014-01-05 MED ORDER — ONDANSETRON HCL 4 MG/2ML IJ SOLN
INTRAMUSCULAR | Status: AC
  Filled 2014-01-05: qty 2

## 2014-01-05 MED ORDER — LACTATED RINGERS IV SOLN
INTRAVENOUS | Status: DC | PRN
  Administered 2014-01-05: 1000 mL

## 2014-01-05 MED ORDER — NEOSTIGMINE METHYLSULFATE 10 MG/10ML IV SOLN
INTRAVENOUS | Status: DC | PRN
Start: 2014-01-05 — End: 2014-01-05
  Administered 2014-01-05: 5 mg via INTRAVENOUS

## 2014-01-05 MED ORDER — BUPIVACAINE-EPINEPHRINE 0.25% -1:200000 IJ SOLN
INTRAMUSCULAR | Status: DC | PRN
  Administered 2014-01-05: 20 mL

## 2014-01-05 MED ORDER — METOCLOPRAMIDE HCL 5 MG/ML IJ SOLN
INTRAMUSCULAR | Status: DC | PRN
  Administered 2014-01-05: 10 mg via INTRAVENOUS

## 2014-01-05 MED ORDER — MIDAZOLAM HCL 2 MG/2ML IJ SOLN
INTRAMUSCULAR | Status: AC
  Filled 2014-01-05: qty 2

## 2014-01-05 MED ORDER — LABETALOL HCL 5 MG/ML IV SOLN
5.0000 mg | INTRAVENOUS | Status: DC | PRN
  Administered 2014-01-05: 5 mg via INTRAVENOUS

## 2014-01-05 MED ORDER — LABETALOL HCL 5 MG/ML IV SOLN
INTRAVENOUS | Status: AC
  Filled 2014-01-05: qty 4

## 2014-01-05 SURGICAL SUPPLY — 41 items
APPLIER CLIP 5 13 M/L LIGAMAX5 (MISCELLANEOUS) ×3
APPLIER CLIP ROT 10 11.4 M/L (STAPLE)
BENZOIN TINCTURE PRP APPL 2/3 (GAUZE/BANDAGES/DRESSINGS) IMPLANT
CABLE HIGH FREQUENCY MONO STRZ (ELECTRODE) ×3 IMPLANT
CLIP APPLIE 5 13 M/L LIGAMAX5 (MISCELLANEOUS) ×1 IMPLANT
CLIP APPLIE ROT 10 11.4 M/L (STAPLE) IMPLANT
CLOSURE WOUND 1/2 X4 (GAUZE/BANDAGES/DRESSINGS)
COVER MAYO STAND STRL (DRAPES) IMPLANT
DECANTER SPIKE VIAL GLASS SM (MISCELLANEOUS) IMPLANT
DERMABOND ADVANCED (GAUZE/BANDAGES/DRESSINGS) ×2
DERMABOND ADVANCED .7 DNX12 (GAUZE/BANDAGES/DRESSINGS) ×1 IMPLANT
DRAPE C-ARM 42X120 X-RAY (DRAPES) IMPLANT
DRAPE LAPAROSCOPIC ABDOMINAL (DRAPES) ×3 IMPLANT
ELECT REM PT RETURN 9FT ADLT (ELECTROSURGICAL) ×3
ELECTRODE REM PT RTRN 9FT ADLT (ELECTROSURGICAL) ×1 IMPLANT
GLOVE BIO SURGEON STRL SZ7 (GLOVE) ×3 IMPLANT
GLOVE BIOGEL PI IND STRL 7.5 (GLOVE) ×1 IMPLANT
GLOVE BIOGEL PI INDICATOR 7.5 (GLOVE) ×2
GOWN STRL REUS W/TWL LRG LVL3 (GOWN DISPOSABLE) ×3 IMPLANT
GOWN STRL REUS W/TWL XL LVL3 (GOWN DISPOSABLE) ×6 IMPLANT
HOVERMATT SINGLE USE (MISCELLANEOUS) ×3 IMPLANT
KIT BASIN OR (CUSTOM PROCEDURE TRAY) ×3 IMPLANT
LIQUID BAND (GAUZE/BANDAGES/DRESSINGS) ×3 IMPLANT
POUCH RETRIEVAL ECOSAC 10 (ENDOMECHANICALS) ×1 IMPLANT
POUCH RETRIEVAL ECOSAC 10MM (ENDOMECHANICALS) ×2
SET CHOLANGIOGRAPH MIX (MISCELLANEOUS) IMPLANT
SET IRRIG TUBING LAPAROSCOPIC (IRRIGATION / IRRIGATOR) ×3 IMPLANT
SLEEVE XCEL OPT CAN 5 100 (ENDOMECHANICALS) ×6 IMPLANT
SOLUTION ANTI FOG 6CC (MISCELLANEOUS) ×3 IMPLANT
STRIP CLOSURE SKIN 1/2X4 (GAUZE/BANDAGES/DRESSINGS) IMPLANT
SUT MNCRL AB 4-0 PS2 18 (SUTURE) ×3 IMPLANT
SUT VICRYL 0 UR6 27IN ABS (SUTURE) ×3 IMPLANT
TAPE STRIPS DRAPE STRL (GAUZE/BANDAGES/DRESSINGS) ×3 IMPLANT
TOWEL OR 17X26 10 PK STRL BLUE (TOWEL DISPOSABLE) ×3 IMPLANT
TOWEL OR NON WOVEN STRL DISP B (DISPOSABLE) ×3 IMPLANT
TRAY LAPAROSCOPIC (CUSTOM PROCEDURE TRAY) ×3 IMPLANT
TROCAR BLADELESS OPT 5 100 (ENDOMECHANICALS) ×3 IMPLANT
TROCAR BLADELESS OPT 5 150 (ENDOMECHANICALS) ×9 IMPLANT
TROCAR XCEL BLUNT TIP 100MML (ENDOMECHANICALS) ×3 IMPLANT
TROCAR XCEL NON-BLD 11X100MML (ENDOMECHANICALS) IMPLANT
TUBING INSUFFLATION 10FT LAP (TUBING) ×3 IMPLANT

## 2014-01-05 NOTE — Anesthesia Preprocedure Evaluation (Addendum)
Anesthesia Evaluation  Patient identified by MRN, date of birth, ID band Patient awake    Reviewed: Allergy & Precautions, H&P , NPO status , Patient's Chart, lab work & pertinent test results  Airway Mallampati: Trach       Dental  (+) Dental Advisory Given   Pulmonary sleep apnea , COPDformer smoker,  + rhonchi   + decreased breath sounds      Cardiovascular hypertension, Pt. on medications +CHF Rhythm:Regular Rate:Normal     Neuro/Psych  Neuromuscular disease negative psych ROS   GI/Hepatic negative GI ROS, Neg liver ROS,   Endo/Other  diabetes, Type 2, Insulin Dependent  Renal/GU negative Renal ROS     Musculoskeletal negative musculoskeletal ROS (+)   Abdominal (+) + obese,   Peds  Hematology negative hematology ROS (+)   Anesthesia Other Findings Trach in place  Reproductive/Obstetrics                          Anesthesia Physical  Anesthesia Plan  ASA: IV  Anesthesia Plan: General   Post-op Pain Management:    Induction: Inhalational  Airway Management Planned: Tracheostomy  Additional Equipment:   Intra-op Plan:   Post-operative Plan: Extubation in OR  Informed Consent: I have reviewed the patients History and Physical, chart, labs and discussed the procedure including the risks, benefits and alternatives for the proposed anesthesia with the patient or authorized representative who has indicated his/her understanding and acceptance.   Dental advisory given  Plan Discussed with: CRNA  Anesthesia Plan Comments:       Anesthesia Quick Evaluation

## 2014-01-05 NOTE — Progress Notes (Signed)
Breath sounds improved after Albuterol, O2 increased to 98% via trach collar. Continues to be restless. Indicates little improvement in pain after 1mg  Dilaudid IV. Denis shortness of breath.

## 2014-01-05 NOTE — Progress Notes (Signed)
PT has secure trach tie, site is red, drain sponge applies, inner cannula WNL at this time, PT refuses suctioning, emergency equipment is in room at this time.

## 2014-01-05 NOTE — Progress Notes (Signed)
Dr. Renold DonGermeroth in to see regarding HR 115-120 and BP 182/74 after 2mg  dilaudid. Feeling better. C/O feeling hot.

## 2014-01-05 NOTE — Op Note (Signed)
Preoperative diagnosis: acute cholecystitis Postoperative diagnosis: same as above Procedure: Laparoscopic cholecystectomy  Surgeon: Dr. Harden MoMatt Lemmie Steinhaus Asst: Dr Avel Peaceodd Rosenbower Anesthesia: Gen. Estimated blood loss: Minimal Complications: None Drains: None Specimens: Gallbladder and contents to pathology Sponge count correct at completion Description to recovery stable  Indications: This a [age] yom who is morbidly obese and has multiple medical problems.  He has cholecystitis. We discussed laparoscopic cholecystectomy and risks/benefits.  Procedure: After informed consent was obtained he was taken to the operating room. He was given antibiotics. Sequential compression devices were on his legs. He was placed under general anesthesia without complication via an ETT. His abdomen was prepped and draped in the standard sterile surgical fashion. A surgical timeout was then performed.  I infiltrated marcaine above his umbilicus. I made a vertical incision and entered into the peritoneum bluntly. A 0 vicryl pursestring was placed and a hasson trocar introduced.The abdomen was insufflated to 15 mm Hg pressure. I then placed 3 further 5 mm trocars in the epigastrium and right upper quadrant under direct vision without complication. I could not move the gallbladder so I aspirated it.I then was able to grasp the gallbladder.I inserted another 5 mm trocar on the left side to be able to see the triangle.  I was able to dissect the ductal structures. I identified the critical view of safety. His cystic duct was very short so I aborted a cholangiogram. I clipped and divided the cystic artery. I then clipped the duct distally. I divided this. These clips completely traversed the duct and the duct was viable. I then removed the gallbladder from the liver bed without difficulty.  This was then placed in a bag. This was eventually removed from the umbilicus. Hemostasis was observed. I removed my hasson trocar and  closed my pursestring suture. I then placed 2 other 0 vicryl sutures. After I closed this I then removed the trocars and desufflated the abdomen. I then closed the skin with 4-0 Monocryl and glue. Steri-Strips were placed over this. He tolerated this was extended and transferred to the recovery room in stable condition.

## 2014-01-05 NOTE — Care Management Note (Signed)
    Page 1 of 2   01/05/2014     4:20:54 PM CARE MANAGEMENT NOTE 01/05/2014  Patient:  Tyrone Cunningham,Tyrone Cunningham   Account Number:  [id number]  Date Initiated:  01/05/2014  Documentation initiated by:  Indiya Izquierdo  Subjective/Objective Assessment:   [id number]-cholecystectomy in morbidly obese male, trach collar due to reps depression/ on Fi02 35-98% post op, iv labetalol for htn     Action/Plan:   tbd will follow for dcd needs or placement due to size and resp status/pt lives alone and depends on friends and neighbors -no family noted   Anticipated DC Date:  01/08/2014   Anticipated DC Plan:  SKILLED NURSING FACILITY  In-house referral  Clinical Social Worker      DC Planning Services  CM consult      [facility]  [facility]   Choice offered to / List presented to:  NA   DME arranged  NA      DME agency  NA     HH arranged  NA      HH agency  NA   Status of service:  In process, will continue to follow Medicare Important Message given?   (If response is "NO", the following Medicare IM given date fields will be blank) Date Medicare IM given:   Medicare IM given by:   Date Additional Medicare IM given:   Additional Medicare IM given by:    Discharge Disposition:    Per UR Regulation:  Reviewed for med. necessity/level of care/duration of stay  If discussed at Long Length of Stay Meetings, dates discussed:    Comments:  [id number]/Ashaya Raftery Earlene Plateravis, RN, BSN, CCM Chart reviewed. Discharge needs and patient's stay to be reviewed and followed by case manager.

## 2014-01-05 NOTE — Consult Note (Deleted)
Name: Tyrone BaileyMark Cunningham MRN: [id number] DOB: 11/21/1955    ADMISSION DATE:  01/03/2014 CONSULTATION DATE:  11/4  REFERRING MD :  Michaell CowingGross  CHIEF COMPLAINT:  Chronic Respiratory failure/ pre-op for Lap Chole   BRIEF PATIENT DESCRIPTION:  This is a 58 year old male f/b PW for chronic respiratory failure in setting of [name]. Also chronic trach dependent due to OSA. PCCM asked to see pre-op for pre-op clearance on 11/4 for Cholecystectomy.   SIGNIFICANT EVENTS  11/4: admitted w/ abd pain d/t acute Cholecystitis  11/4: PCCM asked to see pre-op. #7 metal Tyrone Cunningham changed to 6 cuffed shiley for surgery  11/5: OR planned with return to SDU/ICU post surgery  STUDIES:  11/3 US abd: Cholelithiasis with sludge and stones in the gallbladder and positive Murphy's sign. Changes are compatible with acute cholecystitis in the appropriate clinical setting. 11/3 CT abd: 1. Large amount of biliary sludge and/or small noncalcified gallstones lying dependently in the gallbladder. No definite findings to suggest acute cholecystitis at this time.2. There is very mild haziness in the peripancreatic fat adjacent to the pancreatic head.   HISTORY OF PRESENT ILLNESS:   This is a 58 year old male f/b PW for chronic respiratory failure in setting of [name]. Also chronic trach dependent due to OSA. Presented 11/3 w/ CC: abd pain and constipation. Dx eval c/w acute cholecystitis. Seen by surgery who asked for pre-op pulm eval.    PAST MEDICAL HISTORY :   has a past medical history of Bronchitis; CHF (congestive heart failure); DM type 2 with diabetic peripheral neuropathy; Hypertension; Hypercholesteremia; Hearing loss; Peripheral neuropathy; Sleep apnea; and COPD (chronic obstructive pulmonary disease).  has past surgical history that includes Tracheostomy; Appendectomy; Hand surgery; Femur Surgery; Multiple extractions with alveoloplasty (N/A, 05/11/2012); and Femur hardware removal (09/01/2003). Prior to Admission  medications   Medication Sig Start Date End Date Taking? Authorizing Provider  albuterol (PROVENTIL HFA;VENTOLIN HFA) 108 (90 BASE) MCG/ACT inhaler Inhale 2 puffs into the lungs every 6 (six) hours as needed for wheezing or shortness of breath (shortness of breath).   Yes Historical Provider, MD  albuterol (PROVENTIL) (2.5 MG/3ML) 0.083% nebulizer solution Take 3 mLs (2.5 mg total) by nebulization 4 (four) times daily. 06/29/12  Yes Storm FriskPatrick E Wright, MD  atorvastatin (LIPITOR) 10 MG tablet Take 10 mg by mouth daily.   Yes Historical Provider, MD  Fluticasone-Salmeterol (ADVAIR) 500-50 MCG/DOSE AEPB Inhale 1 puff into the lungs 2 (two) times daily.   Yes Historical Provider, MD  furosemide (LASIX) 40 MG tablet Take 40 mg by mouth daily.   Yes Historical Provider, MD  gabapentin (NEURONTIN) 300 MG capsule Take 3 to 4 tablets at night for pain as needed Patient taking differently: Take 1,200 mg by mouth daily as needed (pain). Take 4 tablets by mouth daily at night as needed for pain. 06/21/13  Yes Collene GobbleSteven A Daub, MD  GRALISE 600 MG TABS Take 3 tablets by mouth every morning.   Yes Historical Provider, MD  insulin aspart (NOVOLOG) 100 UNIT/ML injection Inject 21 Units into the skin 3 (three) times daily before meals.    Yes Historical Provider, MD  insulin glargine (LANTUS) 100 UNIT/ML injection Inject 90 Units into the skin daily.   Yes Historical Provider, MD  Insulin Syringe-Needle U-100 (B-D INS SYR ULTRAFINE 1CC/31G) 31G X 5/16" 1 ML MISC Use as Directed 12/14/13  Yes Collene GobbleSteven A Daub, MD  lisinopril (PRINIVIL,ZESTRIL) 5 MG tablet Take 5 mg by mouth daily.  Yes Historical Provider, MD  oxyCODONE-acetaminophen (PERCOCET) 10-325 MG per tablet Take 1 tablet by mouth every 6 (six) hours as needed for pain (pain).    Yes Historical Provider, MD  polyethylene glycol (MIRALAX / GLYCOLAX) packet Take 17 g by mouth daily.   Yes Historical Provider, MD  spironolactone (ALDACTONE) 25 MG tablet Take 25 mg by mouth  daily.   Yes Historical Provider, MD  tiotropium (SPIRIVA) 18 MCG inhalation capsule Place 18 mcg into inhaler and inhale daily.   Yes Historical Provider, MD  traMADol (ULTRAM) 50 MG tablet Take 100 mg by mouth every 6 (six) hours as needed for severe pain (pain).    Yes Historical Provider, MD  ADVAIR DISKUS 500-50 MCG/DOSE AEPB INHALE 1 PUFF INTO THE LUNGS TWICE DAILY    Collene Gobble, MD  atorvastatin (LIPITOR) 10 MG tablet TAKE ONE TABLET BY MOUTH DAILY 11/02/13   Collene Gobble, MD  furosemide (LASIX) 40 MG tablet TAKE 1 TABLET BY MOUTH TWICE DAILY    Collene Gobble, MD  insulin glargine (LANTUS) 100 UNIT/ML injection INJECT 90 UNITS UNDER THE SKIN EVERY DAY AS DIRECTED 12/14/13   Collene Gobble, MD  lisinopril (PRINIVIL,ZESTRIL) 5 MG tablet TAKE 1 TABLET BY MOUTH DAILY 12/05/13   Collene Gobble, MD  NOVOLIN R 100 UNIT/ML injection USE 21 UNITS FOUR TIMES DAILY BEFORE MEALS PER SLIDING SCALE    Collene Gobble, MD  polyethylene glycol powder (GLYCOLAX/MIRALAX) powder MIX 17 GRAMS(ONE CAPFUL) IN LIQUID OF CHOICE AND DRINK DAILY    Collene Gobble, MD  PROAIR HFA 108 (90 BASE) MCG/ACT inhaler INHALE 2 PUFFS EVERY 4 TO 6 HOURS AS NEEDED 10/03/13   Collene Gobble, MD  spironolactone (ALDACTONE) 25 MG tablet TAKE 1 TABLET BY MOUTH EVERY DAY 10/12/13   Collene Gobble, MD  tiotropium (SPIRIVA HANDIHALER) 18 MCG inhalation capsule INHALE 1 CAPSULE IN HANDIHALER EVERY DAY AS DIRECTED 12/23/13   Morrell Riddle, PA-C  VOLTAREN 1 % GEL Apply 2 g topically daily as needed (hand pain).     Historical Provider, MD   No Known Allergies  FAMILY HISTORY:  family history includes Coronary artery disease in his father. SOCIAL HISTORY:  reports that he quit smoking about 14 years ago. His smoking use included Cigarettes. He has a 60 pack-year smoking history. He has never used smokeless tobacco. He reports that he does not drink alcohol or use illicit drugs.  REVIEW OF SYSTEMS:   Constitutional: Negative for fever, chills,  weight loss, malaise/fatigue and diaphoresis.  HENT: Negative for hearing loss, ear pain, nosebleeds, congestion, sore throat, neck pain, tinnitus and ear discharge.   Eyes: Negative for blurred vision, double vision, photophobia, pain, discharge and redness.  Respiratory: Negative for cough, hemoptysis, sputum production, shortness of breath, wheezing and stridor.   Cardiovascular: Negative for chest pain, palpitations, orthopnea, claudication, leg swelling and PND.  Gastrointestinal: Negative for heartburn, nausea, vomiting, abdominal pain, diarrhea, constipation, blood in stool and melena.  Genitourinary: Negative for dysuria, urgency, frequency, hematuria and flank pain.  Musculoskeletal: Negative for myalgias, back pain, joint pain and falls.  Skin: Negative for itching and rash.  Neurological: Negative for dizziness, tingling, tremors, sensory change, speech change, focal weakness, seizures, loss of consciousness, weakness and headaches.  Endo/Heme/Allergies: Negative for environmental allergies and polydipsia. Does not bruise/bleed easily.  SUBJECTIVE:   VITAL SIGNS: Temp:  [98.8 F (37.1 C)-99.1 F (37.3 C)] 98.8 F (37.1 C) (11/05 0556) Pulse Rate:  [79-98] 92 (  11/05 0556) Resp:  [18-20] 18 (11/05 0556) BP: (126-135)/(61-68) 132/64 mmHg (11/05 0556) SpO2:  [95 %-99 %] 98 % (11/05 0556) FiO2 (%):  [35 %] 35 % (11/05 0556)  PHYSICAL EXAMINATION: General:  Obese white male, sitting up in chair. No distress Neuro:  Awake, alert, anxious but no focal def  HEENT:  Welcome, unable to assess JVD. Trach CDI. Still able to phonate  Cardiovascular:  rrr Lungs:  Clear  Abdomen:  Obese, + bowel sounds. Tender abdomen  Musculoskeletal:  Intact  Skin:  Dry, intact    Recent Labs Lab 01/03/14 1506 01/03/14 1738 01/04/14 0750  NA 135 133* 135*  K 4.5 4.4 4.9  CL 94* 92* 94*  CO2 28 30 29   BUN 16 17 14   CREATININE 0.71 0.71 0.68  GLUCOSE 32* 100* 74    Recent Labs Lab  01/03/14 1502 01/03/14 1738 01/04/14 0750  HGB 17.5 17.2* 17.0  HCT 55.4* 52.8* 53.3*  WBC 14.4* 12.4* 9.4  PLT  --  180 158   Ct Abdomen Pelvis W Contrast  01/03/2014   CLINICAL DATA:  58 year old male with right upper quadrant abdominal pain for the past 4 days, with some associated shortness of breath.  EXAM: CT ABDOMEN AND PELVIS WITH CONTRAST  TECHNIQUE: Multidetector CT imaging of the abdomen and pelvis was performed using the standard protocol following bolus administration of intravenous contrast.  CONTRAST:  100mL OMNIPAQUE IOHEXOL 300 MG/ML  SOLN  COMPARISON:  CT of the abdomen and pelvis 03/09/2011.  FINDINGS: Lower chest:  Mild cardiomegaly.  Mild bibasilar scarring.  Hepatobiliary: Large amount of amorphous high attenuation material layering dependently in the gallbladder, presumably biliary sludge or noncalcified gallstones. Gallbladder is moderately distended. However, the gallbladder wall does not appear thickened, and there is no pericholecystic fluid or inflammatory changes. Diffuse decreased attenuation throughout the hepatic parenchyma, compatible with hepatic steatosis. No focal cystic or solid hepatic lesions. No intra or extrahepatic biliary ductal dilatation.  Pancreas: Very mild haziness adjacent to the pancreatic head. The appearance of the pancreas is otherwise unremarkable.  Spleen: Unremarkable.  Adrenals/Urinary Tract: Bilateral adrenal glands are normal in appearance. Bilateral kidneys are also normal in appearance mild bilateral perinephric stranding (nonspecific). No hydroureteronephrosis to indicate urinary tract obstruction at this time. Urinary bladder is unremarkable in appearance.  Stomach/Bowel: The stomach is normal in appearance. No pathologic dilatation of small bowel or colon.  Vascular/Lymphatic: Extensive atherosclerosis throughout the abdominal and pelvic vasculature, without evidence of aneurysm or dissection. No pathologically enlarged lymph nodes are noted  in the abdomen or pelvis.  Reproductive: Prostate gland is unremarkable in appearance.  Other: No significant volume of ascites.  No pneumoperitoneum.  Musculoskeletal: There are no aggressive appearing lytic or blastic lesions noted in the visualized portions of the skeleton. Status post ORIF in the left femoral neck.  IMPRESSION: 1. Large amount of biliary sludge and/or small noncalcified gallstones lying dependently in the gallbladder. No definite findings to suggest acute cholecystitis at this time. 2. There is very mild haziness in the peripancreatic fat adjacent to the pancreatic head. The possibility of mild pancreatitis warrants consideration, and correlation with lipase levels is recommended. 3. Hepatic steatosis. 4. Atherosclerosis. 5. Additional incidental findings, as above.   Electronically Signed   By: Trudie Reedaniel  Entrikin M.D.   On: 01/03/2014 20:06   [place] Abdomen Limited Ruq  01/03/2014   CLINICAL DATA:  Abdominal pain and constipation.  EXAM: US ABDOMEN LIMITED - RIGHT UPPER QUADRANT  COMPARISON:  None.  FINDINGS:  Gallbladder:  Cholelithiasis with sludge and small stones layering in the gallbladder. No gallbladder wall thickening. Murphy's sign is positive. Changes are compatible with acute cholecystitis in the appropriate clinical setting.  Common bile duct:  Diameter: Not visualized due to body habitus and bowel gas. No intrahepatic bile duct dilatation.  Liver:  Diffusely heterogeneous liver parenchymal echotexture likely representing diffuse fatty infiltration. Portions of the liver are not visualized due to body habitus and shadowing.  IMPRESSION: Cholelithiasis with sludge and stones in the gallbladder and positive Murphy's sign. Changes are compatible with acute cholecystitis in the appropriate clinical setting.   Electronically Signed   By: Burman Nieves M.D.   On: 01/03/2014 23:55    ASSESSMENT / PLAN:  Chronic respiratory failure  [name] OSA Tracheostomy status Acute  Cholecystitis   Discussion His respiratory status is at baseline. He is at high risk for prolonged ventilator dependence, and difficulty weaning but these are not absolute contraindications. The fact that he is already tracheostomy dependant actually makes his pulmonary status a little more stable than if he were not. I have changed him to a 6 cuffed trach so that he can be ventilated during general anesthesia.   Plan Ok from [facility] stand-point for surgery as long as he accepts the risk for prolonged vent dependence (we have discussed this) Continue bronchodilators (will switch to Brovana/budesonide) Would recover him in the stepdown unit post-op  Once he his off vent and stable we will change him back to 7 metal jackson trach.

## 2014-01-05 NOTE — Progress Notes (Signed)
[facility] PROGRESS NOTE  Tyrone BaileyMark Cunningham ZOX:[id number] DOB: 09/22/1955 DOA: 01/03/2014 PCP: Tyrone Cunningham  Assessment/Plan: Acute cholecystitis  s/p lap choly today. tolerated well. Restarted on clears. Monitor in stepdown overnight due to his trach status. Continue empiric Zosyn for today. D/c in am.  Zofran when necessary for nausea.  pulm following   COPD with chronic respiratory failure and chronic trach penance due to OSA/ OHS Tyrone Cunningham changed to cuff 6 Shiley by pulmonary for surgery Stepdown monitoring post op Continue nebs and home inhaler  Type 2 diabetes mellitus A1c of 9.1. Resume home lantus  Chronic diastolic CHF Euvolemic. Resume lasix. Continue Aldactone.  Hypertension Continue lisinopril  Chronic constipation Continue MiraLAX  DVT prophylaxis: Subcutaneous heparin   Code Status: full code Family Communication: none at bedside Disposition Plan:  Home possibly in 1-2 days   Consultants:  Surgery  Pulmonary  Procedures:  Cholecystectomy on  11/5  Antibiotics:  IV Zosyn  HPI/Subjective: Patient seen and examined.after retunring to stepdown from OR. Reports minimal improvement in pain. Wants to eat.  Objective: Filed Vitals:   01/05/14 1549  BP: 148/79  Pulse: 104  Temp:   Resp: 12    Intake/Output Summary (Last 24 hours) at 01/05/14 1659 Last data filed at 01/05/14 1458  Gross per 24 hour  Intake    650 ml  Output   1450 ml  Net   -800 ml   Filed Weights   01/04/14 0338  Weight: 155.4 kg (342 lb 9.5 oz)    Exam:   General:  Elderly obese male in no acute distress  HEENT: Trached, moist oral mucosa,   Chest:clear bilaterally  CVS: Normal S1-S2, no murmurs  Abdomen: Obese, lap choly site clean. Tender to palpation, bowel sounds present  extremities: Warm, no edema   Data Reviewed: Basic Metabolic Panel:  Recent Labs Lab 01/03/14 1506 01/03/14 1738 01/04/14 0750  NA 135 133* 135*  K 4.5 4.4 4.9  CL  94* 92* 94*  CO2 28 30 29   GLUCOSE 32* 100* 74  BUN 16 17 14   CREATININE 0.71 0.71 0.68  CALCIUM 9.4 9.5 9.4   Liver Function Tests:  Recent Labs Lab 01/03/14 1506 01/03/14 1738 01/04/14 0750  AST 16 17 20   ALT 16 18 17   ALKPHOS 94 86 83  BILITOT 0.5 0.3 0.7  PROT 6.9 7.7 7.4  ALBUMIN 3.4* 2.8* 2.8*    Recent Labs Lab 01/03/14 1738  LIPASE 35   No results for input(s): AMMONIA in the last 168 hours. CBC:  Recent Labs Lab 01/03/14 1502 01/03/14 1738 01/04/14 0750  WBC 14.4* 12.4* 9.4  NEUTROABS  --  10.0*  --   HGB 17.5 17.2* 17.0  HCT 55.4* 52.8* 53.3*  MCV 84.7 83.9 85.3  PLT  --  180 158   Cardiac Enzymes: No results for input(s): CKTOTAL, CKMB, CKMBINDEX, TROPONINI in the last 168 hours. BNP (last 3 results)  Recent Labs  01/04/14 0750  PROBNP 94.1   CBG:  Recent Labs Lab 01/04/14 1725 01/04/14 2236 01/05/14 0738 01/05/14 1147 01/05/14 1321  GLUCAP 193* 206* 142* 159* 203*    Recent Results (from the past 240 hour(s))  Urine culture     Status: None   Collection Time: 01/03/14  3:33 PM  Result Value Ref Range Status   Colony Count 40,000 COLONIES/ML  Final   Organism ID, Bacteria Multiple bacterial morphotypes present, none  Final   Organism ID, Bacteria predominant. Suggest appropriate recollection if  Final   Organism ID, Bacteria clinically indicated.  Final  Surgical PCR screen     Status: None   Collection Time: 01/04/14  3:41 AM  Result Value Ref Range Status   MRSA, PCR NEGATIVE NEGATIVE Final   Staphylococcus aureus NEGATIVE NEGATIVE Final    Comment:        The Xpert SA Assay (FDA approved for NASAL specimens in patients over 60 years of age), is one component of a comprehensive surveillance program.  Test performance has been validated by [facility] for patients greater than or equal to 60 year old. It is not intended to diagnose infection nor to guide or monitor treatment.      Studies: Ct Abdomen Pelvis W  Contrast  01/03/2014   CLINICAL DATA:  [age] year old male with right upper quadrant abdominal pain for the past 4 days, with some associated shortness of breath.  EXAM: CT ABDOMEN AND PELVIS WITH CONTRAST  TECHNIQUE: Multidetector CT imaging of the abdomen and pelvis was performed using the standard protocol following bolus administration of intravenous contrast.  CONTRAST:  100mL OMNIPAQUE IOHEXOL 300 MG/ML  SOLN  COMPARISON:  CT of the abdomen and pelvis 03/09/2011.  FINDINGS: Lower chest:  Mild cardiomegaly.  Mild bibasilar scarring.  Hepatobiliary: Large amount of amorphous high attenuation material layering dependently in the gallbladder, presumably biliary sludge or noncalcified gallstones. Gallbladder is moderately distended. However, the gallbladder wall does not appear thickened, and there is no pericholecystic fluid or inflammatory changes. Diffuse decreased attenuation throughout the hepatic parenchyma, compatible with hepatic steatosis. No focal cystic or solid hepatic lesions. No intra or extrahepatic biliary ductal dilatation.  Pancreas: Very mild haziness adjacent to the pancreatic head. The appearance of the pancreas is otherwise unremarkable.  Spleen: Unremarkable.  Adrenals/Urinary Tract: Bilateral adrenal glands are normal in appearance. Bilateral kidneys are also normal in appearance mild bilateral perinephric stranding (nonspecific). No hydroureteronephrosis to indicate urinary tract obstruction at this time. Urinary bladder is unremarkable in appearance.  Stomach/Bowel: The stomach is normal in appearance. No pathologic dilatation of small bowel or colon.  Vascular/Lymphatic: Extensive atherosclerosis throughout the abdominal and pelvic vasculature, without evidence of aneurysm or dissection. No pathologically enlarged lymph nodes are noted in the abdomen or pelvis.  Reproductive: Prostate gland is unremarkable in appearance.  Other: No significant volume of ascites.  No pneumoperitoneum.   Musculoskeletal: There are no aggressive appearing lytic or blastic lesions noted in the visualized portions of the skeleton. Status post ORIF in the left femoral neck.  IMPRESSION: 1. Large amount of biliary sludge and/or small noncalcified gallstones lying dependently in the gallbladder. No definite findings to suggest acute cholecystitis at this time. 2. There is very mild haziness in the peripancreatic fat adjacent to the pancreatic head. The possibility of mild pancreatitis warrants consideration, and correlation with lipase levels is recommended. 3. Hepatic steatosis. 4. Atherosclerosis. 5. Additional incidental findings, as above.   Electronically Signed   By: Trudie Reedaniel  Entrikin M.D.   On: 01/03/2014 20:06   [place] Abdomen Limited Ruq  01/03/2014   CLINICAL DATA:  Abdominal pain and constipation.  EXAM: US ABDOMEN LIMITED - RIGHT UPPER QUADRANT  COMPARISON:  None.  FINDINGS: Gallbladder:  Cholelithiasis with sludge and small stones layering in the gallbladder. No gallbladder wall thickening. Murphy's sign is positive. Changes are compatible with acute cholecystitis in the appropriate clinical setting.  Common bile duct:  Diameter: Not visualized due to body habitus and bowel gas. No intrahepatic bile duct dilatation.  Liver:  Diffusely heterogeneous  liver parenchymal echotexture likely representing diffuse fatty infiltration. Portions of the liver are not visualized due to body habitus and shadowing.  IMPRESSION: Cholelithiasis with sludge and stones in the gallbladder and positive Murphy's sign. Changes are compatible with acute cholecystitis in the appropriate clinical setting.   Electronically Signed   By: Burman Nieves M.D.   On: 01/03/2014 23:55    Scheduled Meds: . acetaminophen  1,000 mg Oral TID  . albuterol  2.5 mg Nebulization QID  . antiseptic oral rinse  7 mL Mouth Rinse QID  . arformoterol  15 mcg Nebulization BID  . atorvastatin  10 mg Oral q1800  . budesonide (PULMICORT) nebulizer  solution  0.5 mg Nebulization BID  . chlorhexidine  15 mL Mouth Rinse BID  . furosemide  40 mg Oral Daily  . gabapentin  600 mg Oral BID  . HYDROmorphone      . HYDROmorphone      . insulin aspart  0-9 Units Subcutaneous TID WC  . insulin glargine  40 Units Subcutaneous Daily  . labetalol      . lip balm  1 application Topical BID  . lisinopril  5 mg Oral Daily  . piperacillin-tazobactam (ZOSYN)  IV  3.375 g Intravenous 3 times per day  . polyethylene glycol  17 g Oral BID  . sodium chloride  3 mL Intravenous Q12H  . spironolactone  25 mg Oral Daily   Continuous Infusions:     Time spent: 25 minutes    Jahzier Villalon  [facility] Pager [phone]. If 7PM-7AM, please contact night-coverage at [email or web address], password [facility] 01/05/2014, 4:59 PM  LOS: 2 days

## 2014-01-05 NOTE — Anesthesia Postprocedure Evaluation (Signed)
Anesthesia Post Note  Patient: Tyrone BaileyMark Cunningham  Procedure(s) Performed: Procedure(s) (LRB): LAPAROSCOPIC CHOLECYSTECTOMY (N/A)  Anesthesia type: General  Patient location: PACU  Post pain: Pain level controlled  Post assessment: Post-op Vital signs reviewed  Last Vitals: BP 166/75 mmHg  Pulse 105  Temp(Src) 36.7 C (Oral)  Resp 20  Ht 5\' 7"  (1.702 m)  Wt 342 lb 9.5 oz (155.4 kg)  BMI 53.65 kg/m2  SpO2 98%  Post vital signs: Reviewed  Level of consciousness: sedated  Complications: No apparent anesthesia complications

## 2014-01-05 NOTE — Progress Notes (Signed)
Moving all over bed  Upon arrival to PACU. IV not running. IV redressed and running better. Nods head yes when asked if hurting. Unable to give pain score. Breath sounds coarse throughout. Albuterol tx given as ordered . Order would not release.

## 2014-01-05 NOTE — Progress Notes (Signed)
Name: Tyrone BaileyMark Cunningham MRN: [id number] DOB: 04/14/1955    ADMISSION DATE:  01/03/2014 CONSULTATION DATE:  11/4  REFERRING MD :  Michaell CowingGross  CHIEF COMPLAINT:  Chronic Respiratory failure/ pre-op for Lap Chole   BRIEF PATIENT DESCRIPTION:  This is a 58 year old male f/b PW for chronic respiratory failure in setting of [name]. Also chronic trach dependent due to OSA. PCCM asked to see pre-op for pre-op clearance on 11/4 for Cholecystectomy.   SIGNIFICANT EVENTS  11/4: admitted w/ abd pain d/t acute Cholecystitis  11/4: PCCM asked to see pre-op. #7 metal Tyrone Cunningham changed to 6 cuffed shiley for surgery  11/5: OR planned with return to SDU/ICU post surgery  STUDIES:  11/3 US abd: Cholelithiasis with sludge and stones in the gallbladder and positive Murphy's sign. Changes are compatible with acute cholecystitis in the appropriate clinical setting. 11/3 CT abd: 1. Large amount of biliary sludge and/or small noncalcified gallstones lying dependently in the gallbladder. No definite findings to suggest acute cholecystitis at this time.2. There is very mild haziness in the peripancreatic fat adjacent to the pancreatic head.    SUBJECTIVE:  No distress.  VITAL SIGNS: Temp:  [98.8 F (37.1 C)-99.1 F (37.3 C)] 98.8 F (37.1 C) (11/05 0556) Pulse Rate:  [79-98] 92 (11/05 0556) Resp:  [18-20] 18 (11/05 0556) BP: (126-135)/(61-68) 132/64 mmHg (11/05 0556) SpO2:  [95 %-99 %] 95 % (11/05 0909) FiO2 (%):  [35 %] 35 % (11/05 0909)  PHYSICAL EXAMINATION: General:  Obese white male, sitting up in chair. No distress Neuro:  Awake, alert, anxious but no focal def  HEENT:  Tyrone Cunningham, unable to assess JVD. Trach CDI. Still able to phonate  Cardiovascular:  rrr Lungs:  Clear  Abdomen:  Obese, + bowel sounds. Tender abdomen  Musculoskeletal:  Intact  Skin:  Dry, intact    Recent Labs Lab 01/03/14 1506 01/03/14 1738 01/04/14 0750  NA 135 133* 135*  K 4.5 4.4 4.9  CL 94* 92* 94*  CO2 28 30 29   BUN 16  17 14   CREATININE 0.71 0.71 0.68  GLUCOSE 32* 100* 74    Recent Labs Lab 01/03/14 1502 01/03/14 1738 01/04/14 0750  HGB 17.5 17.2* 17.0  HCT 55.4* 52.8* 53.3*  WBC 14.4* 12.4* 9.4  PLT  --  180 158   Ct Abdomen Pelvis W Contrast  01/03/2014   CLINICAL DATA:  58 year old male with right upper quadrant abdominal pain for the past 4 days, with some associated shortness of breath.  EXAM: CT ABDOMEN AND PELVIS WITH CONTRAST  TECHNIQUE: Multidetector CT imaging of the abdomen and pelvis was performed using the standard protocol following bolus administration of intravenous contrast.  CONTRAST:  100mL OMNIPAQUE IOHEXOL 300 MG/ML  SOLN  COMPARISON:  CT of the abdomen and pelvis 03/09/2011.  FINDINGS: Lower chest:  Mild cardiomegaly.  Mild bibasilar scarring.  Hepatobiliary: Large amount of amorphous high attenuation material layering dependently in the gallbladder, presumably biliary sludge or noncalcified gallstones. Gallbladder is moderately distended. However, the gallbladder wall does not appear thickened, and there is no pericholecystic fluid or inflammatory changes. Diffuse decreased attenuation throughout the hepatic parenchyma, compatible with hepatic steatosis. No focal cystic or solid hepatic lesions. No intra or extrahepatic biliary ductal dilatation.  Pancreas: Very mild haziness adjacent to the pancreatic head. The appearance of the pancreas is otherwise unremarkable.  Spleen: Unremarkable.  Adrenals/Urinary Tract: Bilateral adrenal glands are normal in appearance. Bilateral kidneys are also normal in appearance mild bilateral perinephric stranding (nonspecific).  No hydroureteronephrosis to indicate urinary tract obstruction at this time. Urinary bladder is unremarkable in appearance.  Stomach/Bowel: The stomach is normal in appearance. No pathologic dilatation of small bowel or colon.  Vascular/Lymphatic: Extensive atherosclerosis throughout the abdominal and pelvic vasculature, without  evidence of aneurysm or dissection. No pathologically enlarged lymph nodes are noted in the abdomen or pelvis.  Reproductive: Prostate gland is unremarkable in appearance.  Other: No significant volume of ascites.  No pneumoperitoneum.  Musculoskeletal: There are no aggressive appearing lytic or blastic lesions noted in the visualized portions of the skeleton. Status post ORIF in the left femoral neck.  IMPRESSION: 1. Large amount of biliary sludge and/or small noncalcified gallstones lying dependently in the gallbladder. No definite findings to suggest acute cholecystitis at this time. 2. There is very mild haziness in the peripancreatic fat adjacent to the pancreatic head. The possibility of mild pancreatitis warrants consideration, and correlation with lipase levels is recommended. 3. Hepatic steatosis. 4. Atherosclerosis. 5. Additional incidental findings, as above.   Electronically Signed   By: Trudie Reedaniel  Entrikin M.D.   On: 01/03/2014 20:06   [place] Abdomen Limited Ruq  01/03/2014   CLINICAL DATA:  Abdominal pain and constipation.  EXAM: US ABDOMEN LIMITED - RIGHT UPPER QUADRANT  COMPARISON:  None.  FINDINGS: Gallbladder:  Cholelithiasis with sludge and small stones layering in the gallbladder. No gallbladder wall thickening. Murphy's sign is positive. Changes are compatible with acute cholecystitis in the appropriate clinical setting.  Common bile duct:  Diameter: Not visualized due to body habitus and bowel gas. No intrahepatic bile duct dilatation.  Liver:  Diffusely heterogeneous liver parenchymal echotexture likely representing diffuse fatty infiltration. Portions of the liver are not visualized due to body habitus and shadowing.  IMPRESSION: Cholelithiasis with sludge and stones in the gallbladder and positive Murphy's sign. Changes are compatible with acute cholecystitis in the appropriate clinical setting.   Electronically Signed   By: Burman NievesWilliam  Stevens M.D.   On: 01/03/2014 23:55    ASSESSMENT /  PLAN:  Chronic respiratory failure  [name] OSA Tracheostomy status Acute Cholecystitis   Discussion Plan for OR today. Anticipate sig chance he will require prolonged post-op ventilation  [facility] as long as he accepts the risk for prolonged vent dependence (we have discussed this) Continue bronchodilators (will switch to Brovana/budesonide) Would recover him in the stepdown unit post-op  Once he his off vent and stable we will change him back to 7 metal jackson trach.  We will see him post-op   Anders SimmondsPete Babcock ACNP-BC [facility]/Critical Care Pager # [phone] OR # [phone] if no answer  Patient seen and examined, agree with above note.  I dictated the care and orders written for this patient under my direction.  Alyson ReedyWesam G Chequita Mofield, MD [phone]

## 2014-01-05 NOTE — Transfer of Care (Addendum)
Immediate Anesthesia Transfer of Care Note  Patient: Tyrone Cunningham  Procedure(s) Performed: Procedure(s): LAPAROSCOPIC CHOLECYSTECTOMY (N/A)  Patient Location: PACU  Anesthesia Type:General  Level of Consciousness: cooperative, following commands, responds to stimulation, titrating pain meds to patient comfort.  Airway & Oxygen Therapy: Patient spontaneously breathing, ventilating well, oxygen via simple oxygen mask.  Post-op Assessment: Report given to PACU RN, vital signs reviewed and stable per patient baseline, moving all extremities.   Post vital signs: Reviewed and stable.  Complications: No apparent anesthesia complications

## 2014-01-06 ENCOUNTER — Telehealth: Payer: Self-pay | Admitting: Critical Care Medicine

## 2014-01-06 ENCOUNTER — Encounter (HOSPITAL_COMMUNITY): Payer: Self-pay | Admitting: General Surgery

## 2014-01-06 LAB — GLUCOSE, CAPILLARY
Glucose-Capillary: 257 mg/dL — ABNORMAL HIGH (ref 70–99)
Glucose-Capillary: 233 mg/dL — ABNORMAL HIGH (ref 70–99)
Glucose-Capillary: 158 mg/dL — ABNORMAL HIGH (ref 70–99)
Glucose-Capillary: 227 mg/dL — ABNORMAL HIGH (ref 70–99)

## 2014-01-06 MED ORDER — INSULIN GLARGINE 100 UNIT/ML ~~LOC~~ SOLN
90.0000 [IU] | Freq: Every day | SUBCUTANEOUS | Status: DC
  Administered 2014-01-06: 90 [IU] via SUBCUTANEOUS
  Filled 2014-01-06 (×2): qty 0.9

## 2014-01-06 MED ORDER — SENNOSIDES-DOCUSATE SODIUM 8.6-50 MG PO TABS
2.0000 | ORAL_TABLET | Freq: Every day | ORAL | Status: DC
  Administered 2014-01-06: 2 via ORAL
  Filled 2014-01-06 (×2): qty 2

## 2014-01-06 NOTE — Progress Notes (Signed)
[facility] PROGRESS NOTE  [name] Cyran WUJ:[id number] DOB: 01/16/1956 DOA: 01/03/2014 PCP: Lucilla EdinAUB, [name], MD  Brief narrative 58 year old morbidly obese male with history of COPD (trach dependent), diastolic CHF, type 2 diabetes mellitus, hypertension, hyperlipidemia, chronic constipation presented with abdominal pain and worsening constipation. Patient found to have acute cholecystitis on abdominal ultrasound. He was seen by pulmonary and cleared for surgery and underwent laparoscopic cholecystectomy on 11/5 and tolerated well.  Assessment/Plan: Acute cholecystitis Laproscopic cholecystectomy done on 11/5 and tolerated well. Patient monitored on stepdown overnight postop due to trach status. Patient was placed on cuffed trach prior to surgery. Advance diet to low-fat. Continue with pain control and antiemetics. Discontinue antibiotics.  COPD with chronic respiratory failure and chronic trach dependent due to OSA/OHS Has a cuff shiley preop. Change to metal Jean Rosenthaljackson today as patient is transferred to medical floor. Continue nebs and home inhaler  Type 2 diabetes mellitus A1c of 9.1. Resume home dose Lantus and continue sliding scale insulin  Chronic diastolic CHF Lasix and Aldactone. Euvolemic  Chronic constipation Continue MiraLAX. Add Senokot  Morbid obesity Counseled on diet and exercise.  DVT prophylaxis  Diet: Low-fat  Code Status: full code Family Communication: none at bedside Disposition Plan: transfer to medical floor. home tomorrow   Consultants:  [facility]  Pulmonary  Procedures:  lap cholecystectomy on 11/5  Antibiotics:  IV  zosyn 11/4-11/6  HPI/Subjective: Since seen and examined. Reports some abdominal pain over surgical site. Has not had bowel movement since yesterday.  Objective: Filed Vitals:   01/06/14 1059  BP:   Pulse: 74  Temp:   Resp: 18    Intake/Output Summary (Last 24 hours) at 01/06/14 1148 Last data filed at 01/06/14  0600  Gross per 24 hour  Intake   1110 ml  Output    975 ml  Net    135 ml   Filed Weights   01/04/14 0338 01/06/14 0500  Weight: 155.4 kg (342 lb 9.5 oz) 152.363 kg (335 lb 14.4 oz)    Exam:   General: Elderly obese male in no acute distress  HEENT: Trached, moist oral mucosa,   Chest:clear bilaterally  CVS: Normal S1-S2, no murmurs  Abdomen: Obese, lap choly site clean. Minimal tenderness to palpation over incision site. bowel sounds present  extremities: Warm, no edema  Data Reviewed: Basic Metabolic Panel:  Recent Labs Lab 01/03/14 1506 01/03/14 1738 01/04/14 0750  NA 135 133* 135*  K 4.5 4.4 4.9  CL 94* 92* 94*  CO2 28 30 29   GLUCOSE 32* 100* 74  BUN 16 17 14   CREATININE 0.71 0.71 0.68  CALCIUM 9.4 9.5 9.4   Liver Function Tests:  Recent Labs Lab 01/03/14 1506 01/03/14 1738 01/04/14 0750  AST 16 17 20   ALT 16 18 17   ALKPHOS 94 86 83  BILITOT 0.5 0.3 0.7  PROT 6.9 7.7 7.4  ALBUMIN 3.4* 2.8* 2.8*    Recent Labs Lab 01/03/14 1738  LIPASE 35   No results for input(s): AMMONIA in the last 168 hours. CBC:  Recent Labs Lab 01/03/14 1502 01/03/14 1738 01/04/14 0750  WBC 14.4* 12.4* 9.4  NEUTROABS  --  10.0*  --   HGB 17.5 17.2* 17.0  HCT 55.4* 52.8* 53.3*  MCV 84.7 83.9 85.3  PLT  --  180 158   Cardiac Enzymes: No results for input(s): CKTOTAL, CKMB, CKMBINDEX, TROPONINI in the last 168 hours. BNP (last 3 results)  Recent Labs  01/04/14 0750  PROBNP 94.1  CBG:  Recent Labs Lab 01/05/14 0738 01/05/14 1147 01/05/14 1321 01/05/14 1621 01/05/14 2103  GLUCAP 142* 159* 203* 217* 324*    Recent Results (from the past 240 hour(s))  Urine culture     Status: None   Collection Time: 01/03/14  3:33 PM  Result Value Ref Range Status   Colony Count 40,000 COLONIES/ML  Final   Organism ID, Bacteria Multiple bacterial morphotypes present, none  Final   Organism ID, Bacteria predominant. Suggest appropriate recollection if    Final   Organism ID, Bacteria clinically indicated.  Final  Surgical PCR screen     Status: None   Collection Time: 01/04/14  3:41 AM  Result Value Ref Range Status   MRSA, PCR NEGATIVE NEGATIVE Final   Staphylococcus aureus NEGATIVE NEGATIVE Final    Comment:        The Xpert SA Assay (FDA approved for NASAL specimens in patients over 60 years of age), is one component of a comprehensive surveillance program.  Test performance has been validated by [facility] for patients greater than or equal to 177 year old. It is not intended to diagnose infection nor to guide or monitor treatment.      Studies: No results found.  Scheduled Meds: . acetaminophen  1,000 mg Oral TID  . albuterol  2.5 mg Nebulization QID  . antiseptic oral rinse  7 mL Mouth Rinse QID  . arformoterol  15 mcg Nebulization BID  . atorvastatin  10 mg Oral q1800  . budesonide (PULMICORT) nebulizer solution  0.5 mg Nebulization BID  . chlorhexidine  15 mL Mouth Rinse BID  . furosemide  40 mg Oral Daily  . gabapentin  600 mg Oral BID  . insulin aspart  0-15 Units Subcutaneous TID WC  . insulin aspart  0-5 Units Subcutaneous QHS  . insulin glargine  60 Units Subcutaneous QHS  . lip balm  1 application Topical BID  . lisinopril  5 mg Oral Daily  . piperacillin-tazobactam (ZOSYN)  IV  3.375 g Intravenous 3 times per day  . polyethylene glycol  17 g Oral BID  . sodium chloride  3 mL Intravenous Q12H  . spironolactone  25 mg Oral Daily   Continuous Infusions:     Time spent: 25 minutes    Plumer Mittelstaedt  [facility] Pager [phone]. If 7PM-7AM, please contact night-coverage at [email or web address], password [email or web address] 01/06/2014, 11:48 AM  LOS: 3 days

## 2014-01-06 NOTE — Telephone Encounter (Signed)
I spoke with the pt and he states he is supposed to have a trach change before he leaves the hospital and he is concerned this will not happen before his discharge. I advised he needs to speak with his nurse at the hospital about this. He states he has and nothing is being done. I advised I will call his nurse and discuss with her. [facility] I called and spoke with [place] and advised her of the pt concerns. She states she will discuss his concerns with him and make sure he gets what he needs. Nothing further needed. Carron CurieJennifer Shakena Callari, CMA

## 2014-01-06 NOTE — Progress Notes (Signed)
   Name: Tyrone Cunningham MRN: [id number] DOB: 03/10/1955    ADMISSION DATE:  01/03/2014 CONSULTATION DATE:  11/4  REFERRING MD :  Tyrone Cunningham  CHIEF COMPLAINT:  Chronic Respiratory failure/ pre-op for Lap Chole   BRIEF PATIENT DESCRIPTION:  This is a 58 year old male f/b PW for chronic respiratory failure in setting of [name]. Also chronic trach dependent due to OSA. PCCM asked to see pre-op for pre-op clearance on 11/4 for Cholecystectomy.   SIGNIFICANT EVENTS  11/4: admitted w/ abd pain d/t acute Cholecystitis  11/4: PCCM asked to see pre-op. #7 metal Jean RosenthalJackson changed to 6 cuffed shiley for surgery  11/5: OR planned with return to SDU/ICU post surgery  STUDIES:  11/3 US abd: Cholelithiasis with sludge and stones in the gallbladder and positive Murphy's sign. Changes are compatible with acute cholecystitis in the appropriate clinical setting. 11/3 CT abd: 1. Large amount of biliary sludge and/or small noncalcified gallstones lying dependently in the gallbladder. No definite findings to suggest acute cholecystitis at this time.2. There is very mild haziness in the peripancreatic fat adjacent to the pancreatic head.    SUBJECTIVE:  No distress.  VITAL SIGNS: Temp:  [97.4 F (36.3 C)-99.1 F (37.3 C)] 98.1 F (36.7 C) (11/06 0800) Pulse Rate:  [78-117] 78 (11/06 0800) Resp:  [10-27] 16 (11/06 0800) BP: (94-189)/(54-99) 169/94 mmHg (11/06 0800) SpO2:  [86 %-99 %] 96 % (11/06 0848) FiO2 (%):  [35 %-98 %] 40 % (11/06 0848) Weight:  [152.363 kg (335 lb 14.4 oz)] 152.363 kg (335 lb 14.4 oz) (11/06 0500)  PHYSICAL EXAMINATION: General:  Obese white male, sitting up in chair. No distress Neuro:  Awake, alert, anxious but no focal def  HEENT:  Kennedy, unable to assess JVD. Trach CDI. Still able to phonate  Cardiovascular:  rrr Lungs:  Clear  Abdomen:  Obese, + bowel sounds. Tender abdomen  Musculoskeletal:  Intact  Skin:  Dry, intact    Recent Labs Lab 01/03/14 1506 01/03/14 1738  01/04/14 0750  NA 135 133* 135*  K 4.5 4.4 4.9  CL 94* 92* 94*  CO2 28 30 29   BUN 16 17 14   CREATININE 0.71 0.71 0.68  GLUCOSE 32* 100* 74    Recent Labs Lab 01/03/14 1502 01/03/14 1738 01/04/14 0750  HGB 17.5 17.2* 17.0  HCT 55.4* 52.8* 53.3*  WBC 14.4* 12.4* 9.4  PLT  --  180 158   No results found.  ASSESSMENT / PLAN:  Chronic respiratory failure  [name] OSA Tracheostomy status Acute Cholecystitis   Discussion Plan for OR today. Anticipate sig chance he will require prolonged post-op ventilation  Plan Change trach back to cuffless 6 then will need to change to a metal trach when one is available. Continue bronchodilators (will switch to Brovana/budesonide). Continue trach collar. Will need a 7 Tyrone Cunningham once out of the ICU PCCM will sign off, please call back if needed.  Alyson ReedyWesam G. Renika Shiflet, M.D. [facility]/Critical [facility] Medicine. Pager: [phone]. After hours pager: [phone].

## 2014-01-06 NOTE — Progress Notes (Signed)
1 Day Post-Op lap chole Subjective: Tolerating clears, denies nausea, on trach collar  Objective: Vital signs in last 24 hours: Temp:  [97.4 F (36.3 C)-99.1 F (37.3 C)] 98.3 F (36.8 C) (11/06 0410) Pulse Rate:  [79-117] 79 (11/06 0600) Resp:  [10-27] 16 (11/06 0600) BP: (94-189)/(54-109) 136/54 mmHg (11/06 0600) SpO2:  [86 %-99 %] 96 % (11/06 0600) FiO2 (%):  [35 %-98 %] 40 % (11/06 0600) Weight:  [335 lb 14.4 oz (152.363 kg)] 335 lb 14.4 oz (152.363 kg) (11/06 0500)   Intake/Output from previous day: 11/05 0701 - 11/06 0700 In: 1110 [P.O.:360; I.V.:600; IV Piggyback:150] Out: 2425 [Urine:2425] Intake/Output this shift:     General appearance: alert and cooperative GI: soft  Incision: no significant drainage  Lab Results:   Recent Labs  01/03/14 1738 01/04/14 0750  WBC 12.4* 9.4  HGB 17.2* 17.0  HCT 52.8* 53.3*  PLT 180 158   BMET  Recent Labs  01/03/14 1738 01/04/14 0750  NA 133* 135*  K 4.4 4.9  CL 92* 94*  CO2 30 29  GLUCOSE 100* 74  BUN 17 14  CREATININE 0.71 0.68  CALCIUM 9.5 9.4   PT/INR  Recent Labs  01/04/14 0905  LABPROT 14.4  INR 1.10   ABG No results for input(s): PHART, HCO3 in the last 72 hours.  Invalid input(s): PCO2, PO2  MEDS, Scheduled . acetaminophen  1,000 mg Oral TID  . albuterol  2.5 mg Nebulization QID  . antiseptic oral rinse  7 mL Mouth Rinse QID  . arformoterol  15 mcg Nebulization BID  . atorvastatin  10 mg Oral q1800  . budesonide (PULMICORT) nebulizer solution  0.5 mg Nebulization BID  . chlorhexidine  15 mL Mouth Rinse BID  . furosemide  40 mg Oral Daily  . gabapentin  600 mg Oral BID  . insulin aspart  0-15 Units Subcutaneous TID WC  . insulin aspart  0-5 Units Subcutaneous QHS  . insulin glargine  60 Units Subcutaneous QHS  . lip balm  1 application Topical BID  . lisinopril  5 mg Oral Daily  . piperacillin-tazobactam (ZOSYN)  IV  3.375 g Intravenous 3 times per day  . polyethylene glycol  17 g  Oral BID  . sodium chloride  3 mL Intravenous Q12H  . spironolactone  25 mg Oral Daily    Studies/Results: No results found.  Assessment: s/p Procedure(s): LAPAROSCOPIC CHOLECYSTECTOMY Patient Active Problem List   Diagnosis Date Noted  . Abdominal pain   . Constipation, chronic 01/04/2014  . Cholecystitis 01/04/2014  . Acute cholecystitis 01/04/2014  . Chronic respiratory failure 01/06/2013  . Edema 03/16/2012  . Obesity 10/20/2011  . DM (diabetes mellitus), type 2 with neurological complications 10/17/2011  . 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 OSA (obstructive sleep apnea) 10/16/2011  . [name] with frequent exacerbations   . CHF (congestive heart failure)   . Hypertension   . Hypercholesteremia   . Hearing loss   . Peripheral neuropathy     Doing well  Plan: Advance diet to fulls then as tolerated Can d/c when medically stable   LOS: 3 days     .Vanita PandaAlicia C Leanora Murin, MD [facility] [place] [phone]   01/06/2014 7:51 AM

## 2014-01-07 DIAGNOSIS — K5900 Constipation, unspecified: Secondary | ICD-10-CM

## 2014-01-07 LAB — GLUCOSE, CAPILLARY
GLUCOSE-CAPILLARY: 147 mg/dL — AB (ref 70–99)
Glucose-Capillary: 190 mg/dL — ABNORMAL HIGH (ref 70–99)

## 2014-01-07 MED ORDER — SENNOSIDES-DOCUSATE SODIUM 8.6-50 MG PO TABS: 2.0000 | ORAL_TABLET | Freq: Every day | ORAL | Status: DC

## 2014-01-07 MED ORDER — MORPHINE SULFATE 15 MG PO TABS: 15.0000 mg | ORAL_TABLET | Freq: Four times a day (QID) | ORAL | Status: DC | PRN

## 2014-01-07 NOTE — Progress Notes (Signed)
01/07/14 1530  Reviewed discharge instructions with patient. Pt verbalized understanding of discharge instructions. Copy of discharge and prescriptions given to patient.

## 2014-01-07 NOTE — Discharge Instructions (Signed)
Cholecystitis Cholecystitis is an inflammation of your gallbladder. It is usually caused by a buildup of gallstones or sludge (cholelithiasis) in your gallbladder. The gallbladder stores a fluid that helps digest fats (bile). Cholecystitis is serious and needs treatment right away.  CAUSES   Gallstones. Gallstones can block the tube that leads to your gallbladder, causing bile to build up. As bile builds up, the gallbladder becomes inflamed.  Bile duct problems, such as blockage from scarring or kinking.  Tumors. Tumors can stop bile from leaving your gallbladder correctly, causing bile to build up. As bile builds up, the gallbladder becomes inflamed. SYMPTOMS   Nausea.  Vomiting.  Abdominal pain, especially in the upper right area of your abdomen.  Abdominal tenderness or bloating.  Sweating.  Chills.  Fever.  Yellowing of the skin and the whites of the eyes (jaundice). DIAGNOSIS  Your caregiver may order blood tests to look for infection or gallbladder problems. Your caregiver may also order imaging tests, such as an ultrasound or computed tomography (CT) scan. Further tests may include a hepatobiliary iminodiacetic acid (HIDA) scan. This scan allows your caregiver to see your bile move from the liver to the gallbladder and to the small intestine. TREATMENT  A hospital stay is usually necessary to lessen the inflammation of your gallbladder. You may be required to not eat or drink (fast) for a certain amount of time. You may be given medicine to treat pain or an antibiotic medicine to treat an infection. Surgery may be needed to remove your gallbladder (cholecystectomy) once the inflammation has gone down. Surgery may be needed right away if you develop complications such as death of gallbladder tissue (gangrene) or a tear (perforation) of the gallbladder.  HOME CARE INSTRUCTIONS  Home care will depend on your treatment. In general:  If you were given antibiotics, take them as  directed. Finish them even if you start to feel better.  Only take over-the-counter or prescription medicines for pain, discomfort, or fever as directed by your caregiver.  Follow a low-fat diet until you see your caregiver again.  Keep all follow-up visits as directed by your caregiver. SEEK IMMEDIATE MEDICAL CARE IF:   Your pain is increasing and not controlled by medicines.  Your pain moves to another part of your abdomen or to your back.  You have a fever.  You have nausea and vomiting. MAKE SURE YOU:  Understand these instructions.  Will watch your condition.  Will get help right away if you are not doing well or get worse. Document Released: 02/17/2005 Document Revised: 05/12/2011 Document Reviewed: 01/03/2011 ExitCare Patient Information 2015 [employer]. This information is not intended to replace advice given to you by your health care provider. Make sure you discuss any questions you have with your health care provider.  

## 2014-01-07 NOTE — Progress Notes (Signed)
Patient ID: [name] Mundorf, male   DOB: 08/17/1955, 58 y.o.   MRN: [id number] 2 Days Post-Op  Subjective: Patient hoping for discharge today. Still some question about replacing his trach. He has incisional soreness when moving that is better.  Objective: Vital signs in last 24 hours: Temp:  [98.1 F (36.7 C)-98.6 F (37 C)] 98.1 F (36.7 C) (11/07 0601) Pulse Rate:  [76-92] 87 (11/07 0900) Resp:  [20-26] 20 (11/07 0601) BP: (110-134)/(50-95) 129/95 mmHg (11/07 0900) SpO2:  [92 %-100 %] 100 % (11/07 0812) FiO2 (%):  [28 %-35 %] 28 % (11/07 0821) Weight:  [326 lb 9.6 oz (148.145 kg)] 326 lb 9.6 oz (148.145 kg) (11/07 0601) Last BM Date: 01/06/14  Intake/Output from previous day: 11/06 0701 - 11/07 0700 In: -  Out: 2375 [Urine:2375] Intake/Output this shift: Total I/O In: 360 [P.O.:360] Out: 500 [Urine:500]  General appearance: alert, cooperative, no distress and morbidly obese GI: no apparent tenderness Incision/Wound: clean and dry  Lab Results:  No results for input(s): WBC, HGB, HCT, PLT in the last 72 hours. BMET No results for input(s): NA, K, CL, CO2, GLUCOSE, BUN, CREATININE, CALCIUM in the last 72 hours.   Studies/Results: No results found.  Anti-infectives: Anti-infectives    Start     Dose/Rate Route Frequency Ordered Stop   01/04/14 0400  piperacillin-tazobactam (ZOSYN) IVPB 3.375 g  Status:  Discontinued     3.375 g12.5 mL/hr over 240 Minutes Intravenous 3 times per day 01/04/14 0350 01/06/14 1154   01/04/14 0315  Ampicillin-Sulbactam (UNASYN) 3 g in sodium chloride 0.9 % 100 mL IVPB  Status:  Discontinued     3 g100 mL/hr over 60 Minutes Intravenous  Once 01/04/14 0302 01/04/14 0350      Assessment/Plan: s/p Procedure(s): LAPAROSCOPIC CHOLECYSTECTOMY No apparent postoperative complications. Okay for discharge when medically stable.   LOS: 4 days    Thayden Lemire T 01/07/2014

## 2014-01-07 NOTE — Progress Notes (Signed)
CARE MANAGEMENT NOTE 01/07/2014  Patient:  Tyrone Cunningham,Tyrone Cunningham   Account Number:  [id number]  Date Initiated:  01/05/2014  Documentation initiated by:  DAVIS,RHONDA  Subjective/Objective Assessment:   [id number]-cholecystectomy in morbidly obese male, trach collar due to reps depression/ on Fi02 35-98% post op, iv labetalol for htn     Action/Plan:   tbd will follow for dcd needs or placement due to size and resp status/pt lives alone and depends on friends and neighbors -no family noted   Anticipated DC Date:  01/08/2014   Anticipated DC Plan:  SKILLED NURSING FACILITY  In-house referral  Clinical Social Worker      DC Planning Services  CM consult      [facility]  [facility]   Choice offered to / List presented to:  NA   DME arranged  TRACH SUPPLIES      DME agency  [facility]     HH arranged  NA      HH agency  NA   Status of service:  Completed, signed off Medicare Important Message given?  YES (If response is "NO", the following Medicare IM given date fields will be blank) Date Medicare IM given:  01/07/2014 Medicare IM [facility]  [name] Date Additional Medicare IM given:   Additional Medicare IM given by:    Discharge Disposition:  HOME/SELF CARE  Per UR Regulation:  Reviewed for med. necessity/level of care/duration of stay  If discussed at Long Length of Stay Meetings, dates discussed:    Comments:  01/07/2014 1711 [facility]ed [facility] have HH respiratory follow up with pt. Isidoro DonningAlesia Sapphira Harjo RN CCM Case Mgmt phone [phone]  01/07/2014 1200 NCM spoke to pt and [facility]. Isidoro DonningAlesia Gracelyn Coventry RN CCM Case Mgmt phone [phone]  [id number]/Rhonda Earlene Plateravis, RN, BSN, CCM Chart reviewed. Discharge needs and patient's stay to be reviewed and followed by case manager.

## 2014-01-07 NOTE — Plan of Care (Signed)
Problem: Phase I Progression Outcomes Goal: OOB as tolerated unless otherwise ordered Outcome: Progressing  Comments:  Patient up in chair 

## 2014-01-07 NOTE — Progress Notes (Signed)
[facility] for scheduled dc home today. They will have delivered to the room. Isidoro DonningAlesia Karime Scheuermann RN CCM Case Mgmt phone [phone]

## 2014-01-07 NOTE — Discharge Summary (Addendum)
Physician Discharge Summary  Tyrone Cunningham RUE:[id number] DOB: [birth year] DOA: 01/03/2014  PCP: Lucilla Edin, MD  Admit date: 01/03/2014 Discharge date: 01/07/2014  Time spent: 35  minutes  Recommendations for Outpatient Follow-up:  Discharge home with outpatient PCP and surgery follow-up  Discharge Diagnoses:  Principal Problem:   Acute cholecystitis  Active Problems:   [name] with frequent exacerbations   CHF (congestive heart failure)   Hypertension   Hypercholesteremia   Tracheostomy dependent   OSA (obstructive sleep apnea)   DM (diabetes mellitus), type 2 with neurological complications   Constipation, chronic   Abdominal pain   Constipation   Discharge Condition: fair  Diet recommendation: diabetic/low fat diet  Filed Weights   01/04/14 0338 01/06/14 0500 01/07/14 0601  Weight: 155.4 kg (342 lb 9.5 oz) 152.363 kg (335 lb 14.4 oz) 148.145 kg (326 lb 9.6 oz)    History of present illness:  Please refer to admission H&P for details, but in brief, 58 year old morbidly obese male with history of COPD (trach dependent), diastolic CHF, type 2 diabetes mellitus, hypertension, hyperlipidemia, chronic constipation presented with abdominal pain and worsening constipation. Patient found to have acute cholecystitis on abdominal ultrasound. He was seen by pulmonary and cleared for surgery and underwent laparoscopic cholecystectomy on 11/5 and tolerated well.  Hospital Course:  Acute cholecystitis secondary to cholelithiasis Laproscopic cholecystectomy done on 11/5 and tolerated well. Patient monitored on stepdown overnight postop as patient was on trach collar. Patient was placed on cuffed trach prior to surgery. Advance diet to low-fat and tolerating well. Complains of abdominal pain off and on.. -off antibiotics. -Patient clinically stable and can be discharged home with outpatient follow-up with PCP and surgery. Pain medications prescribed.  COPD with chronic respiratory  failure and chronic trach dependent due to OSA/OHS patient has a [name]#7  Trach which was changed to size 6 cuff shiley preop.  Patient now transferred to medical floor and changed to his metal [place] today. He does not have an inner cannula on it and ordered a new trach with inner cannula for home which will be arranged for by his home health. Case manager has sent out the request. -Patient currently stable. Continue home inhalers and nebulizer  Type 2 diabetes mellitus A1c of 9.1. Resume home dose Lantus and sliding scale insulin  Chronic diastolic CHF Continue Lasix and Aldactone. Euvolemic  Chronic constipation Continue MiraLAX. Added Senokot  Morbid obesity Counseled on diet and exercise.  DVT prophylaxis  Diet: Low-fat  Code Status: full code Family Communication: none at bedside Disposition Plan: discharge home with home health   Consultants:  [place] [facility]  Pulmonary  Procedures:  lap cholecystectomy on 11/5  Antibiotics:  IV zosyn 11/4-11/6    Discharge Exam: Filed Vitals:   01/07/14 1400  BP: 119/70  Pulse: 88  Temp: 98.2 F (36.8 C)  Resp: 18    General: elderly obese male on trach in NAD  HEENT: Trached, moist oral mucosa,   Chest:clear bilaterally  CVS: Normal S1-S2, no murmurs  Abdomen: Obese, lap choly site clean. Non tender. bowel sounds present  extremities: Warm, no edema   Discharge Instructions You were cared for by a hospitalist during your hospital stay. If you have any questions about your discharge medications or the care you received while you were in the hospital after you are discharged, you can call the unit and asked to speak with the hospitalist on call if the hospitalist that took care of you is not available. Once  you are discharged, your primary care physician will handle any further medical issues. Please note that NO REFILLS for any discharge medications will be authorized once you are discharged, as it  is imperative that you return to your primary care physician (or establish a relationship with a primary care physician if you do not have one) for your aftercare needs so that they can reassess your need for medications and monitor your lab values.   Current Discharge Medication List    START taking these medications   Details  morphine (MSIR) 15 MG tablet Take 1 tablet (15 mg total) by mouth every 6 (six) hours as needed for severe pain. Qty: 30 tablet, Refills: 0    senna-docusate (SENOKOT-S) 8.6-50 MG per tablet Take 2 tablets by mouth at bedtime. Qty: 30 tablet, Refills: 0      CONTINUE these medications which have NOT CHANGED   Details  albuterol (PROVENTIL HFA;VENTOLIN HFA) 108 (90 BASE) MCG/ACT inhaler Inhale 2 puffs into the lungs every 6 (six) hours as needed for wheezing or shortness of breath (shortness of breath).    albuterol (PROVENTIL) (2.5 MG/3ML) 0.083% nebulizer solution Take 3 mLs (2.5 mg total) by nebulization 4 (four) times daily. Qty: 270 mL, Refills: 6    gabapentin (NEURONTIN) 300 MG capsule Take 3 to 4 tablets at night for pain as needed Qty: 120 capsule, Refills: 11    GRALISE 600 MG TABS Take 3 tablets by mouth every morning.    insulin aspart (NOVOLOG) 100 UNIT/ML injection Inject 21 Units into the skin 3 (three) times daily before meals.     insulin glargine (LANTUS) 100 UNIT/ML injection Inject 90 Units into the skin daily.    Insulin Syringe-Needle U-100 (B-D INS SYR ULTRAFINE 1CC/31G) 31G X 5/16" 1 ML MISC Use as Directed Qty: 100 each, Refills: 11    oxyCODONE-acetaminophen (PERCOCET) 10-325 MG per tablet Take 1 tablet by mouth every 6 (six) hours as needed for pain (pain).     spironolactone (ALDACTONE) 25 MG tablet Take 25 mg by mouth daily.    traMADol (ULTRAM) 50 MG tablet Take 100 mg by mouth every 6 (six) hours as needed for severe pain (pain).     ADVAIR DISKUS 500-50 MCG/DOSE AEPB INHALE 1 PUFF INTO THE LUNGS TWICE DAILY Qty: 60 each,  Refills: 5    atorvastatin (LIPITOR) 10 MG tablet TAKE ONE TABLET BY MOUTH DAILY Qty: 90 tablet, Refills: 0    furosemide (LASIX) 40 MG tablet TAKE 1 TABLET BY MOUTH TWICE DAILY Qty: 180 tablet, Refills: 1    lisinopril (PRINIVIL,ZESTRIL) 5 MG tablet TAKE 1 TABLET BY MOUTH DAILY Qty: 90 tablet, Refills: 0    NOVOLIN R 100 UNIT/ML injection USE 21 UNITS FOUR TIMES DAILY BEFORE MEALS PER SLIDING SCALE Qty: 30 mL, Refills: 5    polyethylene glycol powder (GLYCOLAX/MIRALAX) powder MIX 17 GRAMS(ONE CAPFUL) IN LIQUID OF CHOICE AND DRINK DAILY Qty: 527 g, Refills: 5    tiotropium (SPIRIVA HANDIHALER) 18 MCG inhalation capsule INHALE 1 CAPSULE IN HANDIHALER EVERY DAY AS DIRECTED Qty: 30 capsule, Refills: 0    VOLTAREN 1 % GEL Apply 2 g topically daily as needed (hand pain).       STOP taking these medications     polyethylene glycol (MIRALAX / GLYCOLAX) packet        No Known Allergies Follow-up Information    Follow up with DAUB, [name], MD. Schedule an appointment as soon as possible for a visit in 1 week.  Specialty:  Family Medicine   Contact information:   [address] [place] [address] [phone]       Follow up with [facility], MD. Schedule an appointment as soon as possible for a visit in 2 weeks.   Specialty:  General Surgery   Contact information:   [address] [place] [address] [phone]        The results of significant diagnostics from this hospitalization (including imaging, microbiology, ancillary and laboratory) are listed below for reference.    Significant Diagnostic Studies: Ct Abdomen Pelvis W Contrast  01/03/2014   CLINICAL DATA:  58 year old male with right upper quadrant abdominal pain for the past 4 days, with some associated shortness of breath.  EXAM: CT ABDOMEN AND PELVIS WITH CONTRAST  TECHNIQUE: Multidetector CT imaging of the abdomen and pelvis was performed using the standard protocol following bolus  administration of intravenous contrast.  CONTRAST:  OMNIPAQUE IOHEXOL 300 MG/ML  SOLN  COMPARISON:  CT of the abdomen and pelvis 03/09/2011.  FINDINGS: Lower chest:  Mild cardiomegaly.  Mild bibasilar scarring.  Hepatobiliary: Large amount of amorphous high attenuation material layering dependently in the gallbladder, presumably biliary sludge or noncalcified gallstones. Gallbladder is moderately distended. However, the gallbladder wall does not appear thickened, and there is no pericholecystic fluid or inflammatory changes. Diffuse decreased attenuation throughout the hepatic parenchyma, compatible with hepatic steatosis. No focal cystic or solid hepatic lesions. No intra or extrahepatic biliary ductal dilatation.  Pancreas: Very mild haziness adjacent to the pancreatic head. The appearance of the pancreas is otherwise unremarkable.  Spleen: Unremarkable.  Adrenals/Urinary Tract: Bilateral adrenal glands are normal in appearance. Bilateral kidneys are also normal in appearance mild bilateral perinephric stranding (nonspecific). No hydroureteronephrosis to indicate urinary tract obstruction at this time. Urinary bladder is unremarkable in appearance.  Stomach/Bowel: The stomach is normal in appearance. No pathologic dilatation of small bowel or colon.  Vascular/Lymphatic: Extensive atherosclerosis throughout the abdominal and pelvic vasculature, without evidence of aneurysm or dissection. No pathologically enlarged lymph nodes are noted in the abdomen or pelvis.  Reproductive: Prostate gland is unremarkable in appearance.  Other: No significant volume of ascites.  No pneumoperitoneum.  Musculoskeletal: There are no aggressive appearing lytic or blastic lesions noted in the visualized portions of the skeleton. Status post ORIF in the left femoral neck.  IMPRESSION: 1. Large amount of biliary sludge and/or small noncalcified gallstones lying dependently in the gallbladder. No definite findings to suggest acute  cholecystitis at this time. 2. There is very mild haziness in the peripancreatic fat adjacent to the pancreatic head. The possibility of mild pancreatitis warrants consideration, and correlation with lipase levels is recommended. 3. Hepatic steatosis. 4. Atherosclerosis. 5. Additional incidental findings, as above.   Electronically Signed   By: Trudie Reed M.D.   On: 01/03/2014 20:06   US Abdomen Limited Ruq  01/03/2014   CLINICAL DATA:  Abdominal pain and constipation.  EXAM: US ABDOMEN LIMITED - RIGHT UPPER QUADRANT  COMPARISON:  None.  FINDINGS: Gallbladder:  Cholelithiasis with sludge and small stones layering in the gallbladder. No gallbladder wall thickening. Murphy's sign is positive. Changes are compatible with acute cholecystitis in the appropriate clinical setting.  Common bile duct:  Diameter: Not visualized due to body habitus and bowel gas. No intrahepatic bile duct dilatation.  Liver:  Diffusely heterogeneous liver parenchymal echotexture likely representing diffuse fatty infiltration. Portions of the liver are not visualized due to body habitus and shadowing.  IMPRESSION: Cholelithiasis with sludge  and stones in the gallbladder and positive Murphy's sign. Changes are compatible with acute cholecystitis in the appropriate clinical setting.   Electronically Signed   By: Burman NievesWilliam  Stevens M.D.   On: 01/03/2014 23:55    Microbiology: Recent Results (from the past 240 hour(s))  Urine culture     Status: None   Collection Time: 01/03/14  3:33 PM  Result Value Ref Range Status   Colony Count 40,000 COLONIES/ML  Final   Organism ID, Bacteria Multiple bacterial morphotypes present, none  Final   Organism ID, Bacteria predominant. Suggest appropriate recollection if   Final   Organism ID, Bacteria clinically indicated.  Final  Surgical PCR screen     Status: None   Collection Time: 01/04/14  3:41 AM  Result Value Ref Range Status   MRSA, PCR NEGATIVE NEGATIVE Final   Staphylococcus aureus  NEGATIVE NEGATIVE Final    Comment:        The Xpert SA Assay (FDA approved for NASAL specimens in patients over 60 years of age), is one component of a comprehensive surveillance program.  Test performance has been validated by [facility] for patients greater than or equal to 60 year old. It is not intended to diagnose infection nor to guide or monitor treatment.      Labs: Basic Metabolic Panel:  Recent Labs Lab 01/03/14 1506 01/03/14 1738 01/04/14 0750  NA 135 133* 135*  K 4.5 4.4 4.9  CL 94* 92* 94*  CO2 28 30 29   GLUCOSE 32* 100* 74  BUN 16 17 14   CREATININE 0.71 0.71 0.68  CALCIUM 9.4 9.5 9.4   Liver Function Tests:  Recent Labs Lab 01/03/14 1506 01/03/14 1738 01/04/14 0750  AST 16 17 20   ALT 16 18 17   ALKPHOS 94 86 83  BILITOT 0.5 0.3 0.7  PROT 6.9 7.7 7.4  ALBUMIN 3.4* 2.8* 2.8*    Recent Labs Lab 01/03/14 1738  LIPASE 35   No results for input(s): AMMONIA in the last 168 hours. CBC:  Recent Labs Lab 01/03/14 1502 01/03/14 1738 01/04/14 0750  WBC 14.4* 12.4* 9.4  NEUTROABS  --  10.0*  --   HGB 17.5 17.2* 17.0  HCT 55.4* 52.8* 53.3*  MCV 84.7 83.9 85.3  PLT  --  180 158   Cardiac Enzymes: No results for input(s): CKTOTAL, CKMB, CKMBINDEX, TROPONINI in the last 168 hours. BNP: BNP (last 3 results)  Recent Labs  01/04/14 0750  PROBNP 94.1   CBG:  Recent Labs Lab 01/06/14 1043 01/06/14 1142 01/06/14 1559 01/06/14 2219 01/07/14 0811  GLUCAP 257* 233* 158* 227* 147*       Signed:  Reeanna Acri  [facility] 01/07/2014, 2:44 PM

## 2014-01-16 ENCOUNTER — Ambulatory Visit (INDEPENDENT_AMBULATORY_CARE_PROVIDER_SITE_OTHER): Payer: Medicare Other | Admitting: Emergency Medicine

## 2014-01-16 VITALS — BP 142/78 | HR 105 | Temp 99.6°F | Resp 16

## 2014-01-16 DIAGNOSIS — E1149 Type 2 diabetes mellitus with other diabetic neurological complication: Secondary | ICD-10-CM

## 2014-01-16 DIAGNOSIS — K810 Acute cholecystitis: Secondary | ICD-10-CM | POA: Diagnosis not present

## 2014-01-16 DIAGNOSIS — E1140 Type 2 diabetes mellitus with diabetic neuropathy, unspecified: Secondary | ICD-10-CM

## 2014-01-16 LAB — POCT CBC
GRANULOCYTE PERCENT: 80.7 % — AB (ref 37–80)
HCT, POC: 51.2 % (ref 43.5–53.7)
HEMOGLOBIN: 16.2 g/dL (ref 14.1–18.1)
Lymph, poc: 1.6 (ref 0.6–3.4)
MCH, POC: 27 pg (ref 27–31.2)
MCHC: 31.6 g/dL — AB (ref 31.8–35.4)
MCV: 85.6 fL (ref 80–97)
MID (cbc): 0.3 (ref 0–0.9)
MPV: 7.8 fL (ref 0–99.8)
POC GRANULOCYTE: 7.8 — AB (ref 2–6.9)
POC LYMPH PERCENT: 16.7 %L (ref 10–50)
POC MID %: 2.6 %M (ref 0–12)
Platelet Count, POC: 221 10*3/uL (ref 142–424)
RBC: 5.98 M/uL (ref 4.69–6.13)
RDW, POC: 18.8 %
WBC: 9.7 10*3/uL (ref 4.6–10.2)

## 2014-01-16 LAB — GLUCOSE, POCT (MANUAL RESULT ENTRY): POC GLUCOSE: 183 mg/dL — AB (ref 70–99)

## 2014-01-16 NOTE — Progress Notes (Signed)
Subjective:  This chart was scribed for Tyrone SpareSteven A. Cleta Albertsaub, MD by Richarda Overlieichard Holland, Medical scribe. This patient was seen in ROOM 3 and the patient's care was started 9:22 AM.   Patient ID: Tyrone Cunningham, male    DOB: 08/31/1955, 58 y.o.   MRN: [id number] Chief Complaint  Patient presents with  . Hospitalization Follow-up    gallbladder   . Abdominal Pain    HPI HPI Comments: Tyrone Cunningham is a 58 y.o. male with a history of tracheostomy and DM type 2 who presents to [facility] for a hospitalization follow-up for a cholecystectomy he had on 11/5 by Dr. Dwain SarnaWakefield. He states he was in the hospital for 5 days. Pt reports he is able to eat and able to move his bowels at this time. He reports that his blood sugar has been under control. He reports he is seeing Dr. Dwain SarnaWakefield tomorrow. He denies vomiting. He reports no modifying factors at this time.    Past Medical History  Diagnosis Date  . Bronchitis   . CHF (congestive heart failure)   . DM type 2 with diabetic peripheral neuropathy   . Hypertension   . Hypercholesteremia   . Hearing loss   . Peripheral neuropathy   . Sleep apnea     Severe sleep apnea requiring tracheostomy  . COPD (chronic obstructive pulmonary disease)    No Known Allergies Current Outpatient Prescriptions on File Prior to Visit  Medication Sig Dispense Refill  . ADVAIR DISKUS 500-50 MCG/DOSE AEPB INHALE 1 PUFF INTO THE LUNGS TWICE DAILY 60 each 5  . albuterol (PROVENTIL HFA;VENTOLIN HFA) 108 (90 BASE) MCG/ACT inhaler Inhale 2 puffs into the lungs every 6 (six) hours as needed for wheezing or shortness of breath (shortness of breath).    Marland Kitchen. albuterol (PROVENTIL) (2.5 MG/3ML) 0.083% nebulizer solution Take 3 mLs (2.5 mg total) by nebulization 4 (four) times daily. 270 mL 6  . atorvastatin (LIPITOR) 10 MG tablet TAKE ONE TABLET BY MOUTH DAILY 90 tablet 0  . furosemide (LASIX) 40 MG tablet TAKE 1 TABLET BY MOUTH TWICE DAILY 180 tablet 1  . gabapentin (NEURONTIN) 300 MG capsule Take  3 to 4 tablets at night for pain as needed (Patient taking differently: Take 1,200 mg by mouth daily as needed (pain). Take 4 tablets by mouth daily at night as needed for pain.) 120 capsule 11  . GRALISE 600 MG TABS Take 3 tablets by mouth every morning.    . insulin aspart (NOVOLOG) 100 UNIT/ML injection Inject 21 Units into the skin 3 (three) times daily before meals.     . insulin glargine (LANTUS) 100 UNIT/ML injection Inject 90 Units into the skin daily.    . Insulin Syringe-Needle U-100 (B-D INS SYR ULTRAFINE 1CC/31G) 31G X 5/16" 1 ML MISC Use as Directed 100 each 11  . lisinopril (PRINIVIL,ZESTRIL) 5 MG tablet TAKE 1 TABLET BY MOUTH DAILY 90 tablet 0  . NOVOLIN R 100 UNIT/ML injection USE 21 UNITS FOUR TIMES DAILY BEFORE MEALS PER SLIDING SCALE 30 mL 5  . oxyCODONE-acetaminophen (PERCOCET) 10-325 MG per tablet Take 1 tablet by mouth every 6 (six) hours as needed for pain (pain).     . polyethylene glycol powder (GLYCOLAX/MIRALAX) powder MIX 17 GRAMS(ONE CAPFUL) IN LIQUID OF CHOICE AND DRINK DAILY 527 g 5  . senna-docusate (SENOKOT-S) 8.6-50 MG per tablet Take 2 tablets by mouth at bedtime. 30 tablet 0  . spironolactone (ALDACTONE) 25 MG tablet Take 25 mg by mouth daily.    .Marland Kitchen  tiotropium (SPIRIVA HANDIHALER) 18 MCG inhalation capsule INHALE 1 CAPSULE IN HANDIHALER EVERY DAY AS DIRECTED 30 capsule 0  . traMADol (ULTRAM) 50 MG tablet Take 100 mg by mouth every 6 (six) hours as needed for severe pain (pain).     . VOLTAREN 1 % GEL Apply 2 g topically daily as needed (hand pain).     Marland Kitchen morphine (MSIR) 15 MG tablet Take 1 tablet (15 mg total) by mouth every 6 (six) hours as needed for severe pain. 30 tablet 0   No current facility-administered medications on file prior to visit.   Family History  Problem Relation Age of Onset  . Coronary artery disease Father    Past Surgical History  Procedure Laterality Date  . Tracheostomy    . Appendectomy    . Hand surgery    . Femur surgery    .  Multiple extractions with alveoloplasty N/A 05/11/2012    Procedure: MULTIPLE EXTRACTION WITH ALVEOLOPLASTY;  Surgeon: [name], DDS;  Location: [facility] OR;  Service: Oral Surgery;  Laterality: N/A;  . Femur hardware removal  09/01/2003    Removal of retained hardware, two interlocking distal femoral  . Cholecystectomy N/A 01/05/2014    Procedure: LAPAROSCOPIC CHOLECYSTECTOMY;  Surgeon: Emelia Loron, MD;  Location: [facility] ORS;  Service: General;  Laterality: N/A;   Filed Vitals:   01/16/14 0845  BP: 142/78  Pulse: 105  Temp: 99.6 F (37.6 C)  Resp: 16    Review of Systems  Constitutional: Negative for appetite change.  Gastrointestinal: Negative for vomiting and constipation.       Objective:  Physical Exam  Constitutional: He is oriented to person, place, and time. He appears well-developed and well-nourished.  HENT:  Head: Normocephalic and atraumatic.  Trach tube.   Neck: Normal range of motion. Neck supple. No tracheal deviation present.  Cardiovascular: Normal rate and regular rhythm.   Pulmonary/Chest: Effort normal and breath sounds normal. No respiratory distress. He has no wheezes. He has no rales.  Abdominal: He exhibits no distension. There is no tenderness.  Healed portal sites without abdominal tenderness.   Neurological: He is alert and oriented to person, place, and time.  Skin: Skin is warm and dry.  Psychiatric: He has a normal mood and affect. His behavior is normal.  Nursing note and vitals reviewed.  Results for orders placed or performed in visit on 01/16/14  POCT CBC  Result Value Ref Range   WBC 9.7 4.6 - 10.2 K/uL   Lymph, poc 1.6 0.6 - 3.4   POC LYMPH PERCENT 16.7 10 - 50 %L   MID (cbc) 0.3 0 - 0.9   POC MID % 2.6 0 - 12 %M   POC Granulocyte 7.8 (A) 2 - 6.9   Granulocyte percent 80.7 (A) 37 - 80 %G   RBC 5.98 4.69 - 6.13 M/uL   Hemoglobin 16.2 14.1 - 18.1 g/dL   HCT, POC 96.0 45.4 - 53.7 %   MCV 85.6 80 - 97 fL   MCH, POC 27.0 27 - 31.2 pg    MCHC 31.6 (A) 31.8 - 35.4 g/dL   RDW, POC 09.8 %   Platelet Count, POC 221 142 - 424 K/uL   MPV 7.8 0 - 99.8 fL  POCT glucose (manual entry)  Result Value Ref Range   POC Glucose 183 (A) 70 - 99 mg/dl       Assessment & Plan:  Patient looks great. He did well with his surgery. His white count is  normal. Hemoglobin A1c on last check was 9.1 which is significantly improved. I will make sure he has a follow-up appointment in 3 months at 104. I personally performed the services described in this documentation, which was scribed in my presence. The recorded information has been reviewed and is accurate.

## 2014-01-20 ENCOUNTER — Other Ambulatory Visit: Payer: Self-pay | Admitting: Physician Assistant

## 2014-01-31 ENCOUNTER — Telehealth: Payer: Self-pay

## 2014-01-31 DIAGNOSIS — G5691 Unspecified mononeuropathy of right upper limb: Secondary | ICD-10-CM | POA: Diagnosis not present

## 2014-01-31 DIAGNOSIS — G5602 Carpal tunnel syndrome, left upper limb: Secondary | ICD-10-CM | POA: Diagnosis not present

## 2014-01-31 DIAGNOSIS — G5692 Unspecified mononeuropathy of left upper limb: Secondary | ICD-10-CM | POA: Diagnosis not present

## 2014-01-31 NOTE — Telephone Encounter (Signed)
Please call patient again and leave detailed message. [phone] (H)

## 2014-02-16 ENCOUNTER — Other Ambulatory Visit: Payer: Self-pay | Admitting: Physician Assistant

## 2014-02-21 ENCOUNTER — Telehealth: Payer: Self-pay

## 2014-02-21 NOTE — Telephone Encounter (Signed)
Please see what we need to do to keep this patient on spur Riva. I do not think any change is appropriate

## 2014-02-21 NOTE — Telephone Encounter (Signed)
Dr Cleta Albertsaub, [facility] faxed an notice that they will not be covering Spriva starting Jan 1, and their preferred alternative is Incruse Ellipta. Do you want to Rx this for pt instead?

## 2014-02-27 NOTE — Telephone Encounter (Signed)
Talked w/Dr Cleta Albertsaub who explained that pt is unstable and recently released from hosp. He was started on Spiriva several yrs ago by pulmonary specialists who want him to remain on it as it has been effective for pt. In addition to COPD, pt has tracheostomy and chronic respiratory failure. Completed PA on covermymeds.

## 2014-02-28 NOTE — Telephone Encounter (Signed)
PA was approved from 03/03/14 - 03/03/15. Notified pt. Dr. Cleta Albertsaub, Lorain ChildesFYI

## 2014-03-01 ENCOUNTER — Other Ambulatory Visit: Payer: Self-pay | Admitting: Emergency Medicine

## 2014-03-17 ENCOUNTER — Other Ambulatory Visit: Payer: Self-pay | Admitting: Emergency Medicine

## 2014-03-29 ENCOUNTER — Other Ambulatory Visit: Payer: Self-pay | Admitting: Emergency Medicine

## 2014-04-06 ENCOUNTER — Ambulatory Visit: Payer: Medicare Other | Admitting: Emergency Medicine

## 2014-04-11 ENCOUNTER — Other Ambulatory Visit: Payer: Self-pay | Admitting: Emergency Medicine

## 2014-04-13 ENCOUNTER — Encounter: Payer: Self-pay | Admitting: Emergency Medicine

## 2014-04-13 ENCOUNTER — Other Ambulatory Visit: Payer: Self-pay | Admitting: Emergency Medicine

## 2014-04-13 ENCOUNTER — Ambulatory Visit (INDEPENDENT_AMBULATORY_CARE_PROVIDER_SITE_OTHER): Payer: Medicare Other | Admitting: Emergency Medicine

## 2014-04-13 VITALS — BP 130/74 | HR 106 | Temp 98.9°F | Resp 20 | Ht 65.0 in | Wt 347.0 lb

## 2014-04-13 DIAGNOSIS — E1140 Type 2 diabetes mellitus with diabetic neuropathy, unspecified: Secondary | ICD-10-CM

## 2014-04-13 DIAGNOSIS — R0602 Shortness of breath: Secondary | ICD-10-CM | POA: Diagnosis not present

## 2014-04-13 DIAGNOSIS — E1149 Type 2 diabetes mellitus with other diabetic neurological complication: Secondary | ICD-10-CM

## 2014-04-13 LAB — GLUCOSE, POCT (MANUAL RESULT ENTRY): POC GLUCOSE: 222 mg/dL — AB (ref 70–99)

## 2014-04-13 LAB — POCT GLYCOSYLATED HEMOGLOBIN (HGB A1C): HEMOGLOBIN A1C: 9.4

## 2014-04-13 NOTE — Progress Notes (Addendum)
This chart was scribed for Collene Gobble, MD by Lionel December, ED Scribe. This patient was seen in room 21 and the patient's care was started at 11:09 AM.    Patient ID: Tyrone Cunningham, male    DOB: 1956-01-01, 59 y.o.   MRN: [id number]  HPI  HPI Comments: Tyrone Cunningham is a 59 y.o. male who presents to [facility] with a history of COPD who is tracheostomy dependent is here for a follow up. Patient currently uses Novolog and lantis 90.  Notes that his breathing has gotten a little better. Patient goes to pain management (Dr. Loyola Mast). Patient has not seen an opthalmologist. Patient checks his sugar about 4 times a day but does not keep track of the numbers. He continues on oxygen therapy. He uses 6 L by continuous flow over his tracheostomy.     Patient Active Problem List   Diagnosis Date Noted  . Constipation 01/07/2014  . Abdominal pain   . Constipation, chronic 01/04/2014  . Acute cholecystitis 01/04/2014  . Chronic respiratory failure 01/06/2013  . Edema 03/16/2012  . Obesity 10/20/2011  . DM (diabetes mellitus), type 2 with neurological complications 10/17/2011  . 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 OSA (obstructive sleep apnea) 10/16/2011  . [name] with frequent exacerbations   . CHF (congestive heart failure)   . Hypertension   . Hypercholesteremia   . Hearing loss   . Peripheral neuropathy    Past Medical History  Diagnosis Date  . Bronchitis   . CHF (congestive heart failure)   . DM type 2 with diabetic peripheral neuropathy   . Hypertension   . Hypercholesteremia   . Hearing loss   . Peripheral neuropathy   . Sleep apnea     Severe sleep apnea requiring tracheostomy  . COPD (chronic obstructive pulmonary disease)    Past Surgical History  Procedure Laterality Date  . Tracheostomy    . Appendectomy    . Hand surgery    . Femur surgery    . Multiple extractions with alveoloplasty N/A 05/11/2012    Procedure: MULTIPLE EXTRACTION WITH  ALVEOLOPLASTY;  Surgeon: [name], DDS;  Location: [facility] OR;  Service: Oral Surgery;  Laterality: N/A;  . Femur hardware removal  09/01/2003    Removal of retained hardware, two interlocking distal femoral  . Cholecystectomy N/A 01/05/2014    Procedure: LAPAROSCOPIC CHOLECYSTECTOMY;  Surgeon: Emelia Loron, MD;  Location: [facility] ORS;  Service: General;  Laterality: N/A;   No Known Allergies Prior to Admission medications   Medication Sig Start Date End Date Taking? Authorizing Provider  ADVAIR DISKUS 500-50 MCG/DOSE AEPB INHALE 1 PUFF INTO THE LUNGS TWICE DAILY 03/17/14   Collene Gobble, MD  albuterol (PROVENTIL HFA;VENTOLIN HFA) 108 (90 BASE) MCG/ACT inhaler Inhale 2 puffs into the lungs every 6 (six) hours as needed for wheezing or shortness of breath (shortness of breath).    Historical Provider, MD  albuterol (PROVENTIL) (2.5 MG/3ML) 0.083% nebulizer solution Take 3 mLs (2.5 mg total) by nebulization 4 (four) times daily. 06/29/12   Storm Frisk, MD  atorvastatin (LIPITOR) 10 MG tablet TAKE 1 TABLET BY MOUTH DAILY 03/02/14   Collene Gobble, MD  furosemide (LASIX) 40 MG tablet TAKE 1 TABLET BY MOUTH TWICE DAILY    Collene Gobble, MD  gabapentin (NEURONTIN) 300 MG capsule Take 3 to 4 tablets at night for pain as needed Patient taking differently: Take 1,200 mg by mouth daily  as needed (pain). Take 4 tablets by mouth daily at night as needed for pain. 06/21/13   Collene GobbleSteven A Ciella Obi, MD  GRALISE 600 MG TABS Take 3 tablets by mouth every morning.    Historical Provider, MD  insulin aspart (NOVOLOG) 100 UNIT/ML injection Inject 21 Units into the skin 3 (three) times daily before meals.     Historical Provider, MD  insulin glargine (LANTUS) 100 UNIT/ML injection Inject 90 Units into the skin daily.    Historical Provider, MD  Insulin Syringe-Needle U-100 (INSULIN SYRINGE .3CC/31GX5/16") 31G X 5/16" 0.3 ML MISC USE AS DIRECTED 03/29/14   Chelle S Jeffery, PA-C  lisinopril (PRINIVIL,ZESTRIL) 5 MG tablet TAKE 1  TABLET BY MOUTH DAILY 12/05/13   Collene GobbleSteven A Graviela Nodal, MD  morphine (MSIR) 15 MG tablet Take 1 tablet (15 mg total) by mouth every 6 (six) hours as needed for severe pain. 01/07/14   Nishant Dhungel, MD  NOVOLIN R 100 UNIT/ML injection USE 21 UNITS FOUR TIMES DAILY BEFORE MEALS PER SLIDING SCALE    Collene GobbleSteven A Clarise Chacko, MD  oxyCODONE-acetaminophen (PERCOCET) 10-325 MG per tablet Take 1 tablet by mouth every 6 (six) hours as needed for pain (pain).     Historical Provider, MD  polyethylene glycol powder (GLYCOLAX/MIRALAX) powder MIX 17 GRAMS(ONE CAPFUL) IN LIQUID OF CHOICE AND DRINK DAILY    Collene GobbleSteven A Aneita Kiger, MD  senna-docusate (SENOKOT-S) 8.6-50 MG per tablet Take 2 tablets by mouth at bedtime. 01/07/14   Nishant Dhungel, MD  SPIRIVA HANDIHALER 18 MCG inhalation capsule INHALE THE CONTENTS OF 1 CAPSULE VIA HANDIHALER BY MOUTH EVERY DAY AS DIRECTED 04/12/14   Collene GobbleSteven A Zanyia Silbaugh, MD  spironolactone (ALDACTONE) 25 MG tablet Take 25 mg by mouth daily.    Historical Provider, MD  spironolactone (ALDACTONE) 25 MG tablet TAKE 1 TABLET BY MOUTH EVERY DAY 04/12/14   Collene GobbleSteven A Amylee Lodato, MD  traMADol (ULTRAM) 50 MG tablet Take 100 mg by mouth every 6 (six) hours as needed for severe pain (pain).     Historical Provider, MD  VOLTAREN 1 % GEL Apply 2 g topically daily as needed (hand pain).     Historical Provider, MD   History   Social History  . Marital Status: Single    Spouse Name: N/A  . Number of Children: N/A  . Years of Education: N/A   Occupational History  . Not on file.   Social History Main Topics  . Smoking status: Former Smoker -- 2.00 packs/day for 30 years    Types: Cigarettes    Quit date: 07/02/1999  . Smokeless tobacco: Never Used  . Alcohol Use: No  . Drug Use: No  . Sexual Activity: No   Other Topics Concern  . Not on file   Social History Narrative    Review of Systems     Objective:   Physical Exam CONSTITUTIONAL: disheveled male sitting in his wheel chair. in no distress HEAD:  Normocephalic/atraumatic EYES: EOMI/PERRL ENMT: Mucous membranes moist NECK: trachestomy tube in place SPINE/BACK:entire spine nontender CV: S1/S2 noted, no murmurs/rubs/gallops noted LUNGS: bilateral rhonci but good air exchange ABDOMEN: soft, nontender, no rebound or guarding, bowel sounds noted throughout abdomen GU:no cva tenderness NEURO: Pt is awake/alert/appropriate, moves all extremitiesx4.  No facial droop.   EXTREMITIES: extreme: dependent rubor, pulses present with decreased sensation to filament testing.  SKIN: warm, color normal PSYCH: no abnormalities of mood noted, alert and oriented to situation   I personally performed the services described in this documentation, which was scribed in  my presence. The recorded information has been reviewed and is accurate.   Filed Vitals:   04/13/14 1057  BP: 130/74  Pulse: 106  Temp: 98.9 F (37.2 C)  Resp: 20  Height:  (1.651 m)  Weight: 347 lb (157.398 kg)  SpO2: 91%   Results for orders placed or performed in visit on 04/13/14  POCT glucose (manual entry)  Result Value Ref Range   POC Glucose 222 (A) 70 - 99 mg/dl  POCT glycosylated hemoglobin (Hb A1C)  Result Value Ref Range   Hemoglobin A1C 9.4         Assessment & Plan:  I really have no idea what is going on at home. He does not follow a diet. He does not write down his sugars. It is unclear what dosages of insulin he takes. His weight is up 20 pounds from his last visit and his hemoglobin A1c is up to 9.4. I encouraged him to eat healthy. I encouraged him to write down his sugars when he checks it. He did not appear to be in congestive heart failure but it is worrisome regarding his weight gain of 20 pounds. I will put in a future order for patient to have a BNP done as a lab only encounter when he is in the area.He will continue O2 flow at 6 L over his tracheostomy.

## 2014-04-25 ENCOUNTER — Other Ambulatory Visit: Payer: Self-pay | Admitting: Emergency Medicine

## 2014-04-25 DIAGNOSIS — G4733 Obstructive sleep apnea (adult) (pediatric): Secondary | ICD-10-CM | POA: Diagnosis not present

## 2014-04-25 DIAGNOSIS — Z430 Encounter for attention to tracheostomy: Secondary | ICD-10-CM | POA: Diagnosis not present

## 2014-04-25 DIAGNOSIS — Z930 Tracheostomy status: Secondary | ICD-10-CM | POA: Diagnosis not present

## 2014-04-28 ENCOUNTER — Other Ambulatory Visit: Payer: Self-pay | Admitting: Emergency Medicine

## 2014-05-01 DIAGNOSIS — H2513 Age-related nuclear cataract, bilateral: Secondary | ICD-10-CM | POA: Diagnosis not present

## 2014-05-01 DIAGNOSIS — E11329 Type 2 diabetes mellitus with mild nonproliferative diabetic retinopathy without macular edema: Secondary | ICD-10-CM | POA: Diagnosis not present

## 2014-05-01 LAB — HM DIABETES EYE EXAM

## 2014-05-02 DIAGNOSIS — G894 Chronic pain syndrome: Secondary | ICD-10-CM | POA: Diagnosis not present

## 2014-05-02 DIAGNOSIS — G629 Polyneuropathy, unspecified: Secondary | ICD-10-CM | POA: Diagnosis not present

## 2014-05-02 DIAGNOSIS — Z79899 Other long term (current) drug therapy: Secondary | ICD-10-CM | POA: Diagnosis not present

## 2014-05-02 DIAGNOSIS — G5600 Carpal tunnel syndrome, unspecified upper limb: Secondary | ICD-10-CM | POA: Diagnosis not present

## 2014-05-05 ENCOUNTER — Telehealth: Payer: Self-pay

## 2014-05-05 NOTE — Telephone Encounter (Signed)
Called pt and LMOM to CB Received fax from [facility] to reorder pt's O2. Called to see how many liters he is on.  Form in nurses box

## 2014-05-06 ENCOUNTER — Other Ambulatory Visit: Payer: Self-pay | Admitting: Emergency Medicine

## 2014-05-06 NOTE — Telephone Encounter (Signed)
Pt called back yesterday to say that he is on 6L. Filled out form and sent to be faxed.

## 2014-05-18 ENCOUNTER — Other Ambulatory Visit: Payer: Self-pay | Admitting: Emergency Medicine

## 2014-05-19 ENCOUNTER — Other Ambulatory Visit: Payer: Self-pay | Admitting: Critical Care Medicine

## 2014-05-22 ENCOUNTER — Telehealth: Payer: Self-pay | Admitting: Critical Care Medicine

## 2014-05-22 MED ORDER — ALBUTEROL SULFATE (2.5 MG/3ML) 0.083% IN NEBU: 2.5000 mg | INHALATION_SOLUTION | Freq: Four times a day (QID) | RESPIRATORY_TRACT | Status: DC

## 2014-05-22 NOTE — Telephone Encounter (Signed)
Patient has not seen Dr. Delford FieldWright in 2 years.  This will need to be refilled through Dr. Cleta Albertsaub.    Sent to [facility] Cle[facility] at [facility] for follow up.  Dr. Cleta Albertsaub - patient requesting refill request for his Albuterol.  See message from Pharmacy below.

## 2014-05-22 NOTE — Telephone Encounter (Signed)
It is fine to refill the patient's albuterol.

## 2014-05-25 ENCOUNTER — Encounter: Payer: Self-pay | Admitting: Emergency Medicine

## 2014-05-30 ENCOUNTER — Other Ambulatory Visit: Payer: Self-pay | Admitting: Emergency Medicine

## 2014-06-04 ENCOUNTER — Other Ambulatory Visit: Payer: Self-pay | Admitting: Physician Assistant

## 2014-06-15 ENCOUNTER — Telehealth: Payer: Self-pay | Admitting: Family Medicine

## 2014-06-15 NOTE — Telephone Encounter (Signed)
lmom that Dr Cleta Albertsaub changed his appt on 07/25/14 to 07/11/14 this is his new appt date and patient is to arrive at 1:15

## 2014-06-26 ENCOUNTER — Telehealth: Payer: Self-pay

## 2014-06-26 DIAGNOSIS — M542 Cervicalgia: Secondary | ICD-10-CM | POA: Diagnosis not present

## 2014-06-26 DIAGNOSIS — M1990 Unspecified osteoarthritis, unspecified site: Secondary | ICD-10-CM | POA: Diagnosis not present

## 2014-06-26 DIAGNOSIS — Z79899 Other long term (current) drug therapy: Secondary | ICD-10-CM | POA: Diagnosis not present

## 2014-06-26 DIAGNOSIS — M25569 Pain in unspecified knee: Secondary | ICD-10-CM | POA: Diagnosis not present

## 2014-06-26 DIAGNOSIS — G894 Chronic pain syndrome: Secondary | ICD-10-CM | POA: Diagnosis not present

## 2014-06-26 NOTE — Telephone Encounter (Signed)
Can we rx pt a nebulizer?

## 2014-06-26 NOTE — Telephone Encounter (Signed)
PATIENT WOULD LIKE DR. DAUB TO KNOW THAT HE NEEDS A NEBULIZER MACHINE. HE SAID [facility] WILL NOT LET HIM HAVE ONE. BEST PHONE [phone] (CELL)  MBC

## 2014-06-26 NOTE — Telephone Encounter (Signed)
Patient called about the same thing. Please advise

## 2014-06-26 NOTE — Telephone Encounter (Signed)
Dr daub- Please advise. 

## 2014-06-27 NOTE — Telephone Encounter (Signed)
Yes please send in a request for her nebulizer machine.

## 2014-06-28 ENCOUNTER — Telehealth: Payer: Self-pay

## 2014-06-28 NOTE — Telephone Encounter (Signed)
Delaney Meigsamara-- Please write rx for this.

## 2014-06-28 NOTE — Telephone Encounter (Signed)
Lisa at [facility] is needing to have the cms 484 form completed by dr daub-it has been signed but there is a section that needs to be completed   They have faxed it twice today

## 2014-06-29 ENCOUNTER — Other Ambulatory Visit: Payer: Self-pay | Admitting: Physician Assistant

## 2014-06-29 MED ORDER — UNABLE TO FIND: Status: DC

## 2014-06-29 NOTE — Telephone Encounter (Signed)
Patient has been notified his prescription for his nebulizer is ready for pick up at [address]. Patient stated he would pick it up tomorrow morning.

## 2014-06-29 NOTE — Telephone Encounter (Signed)
To Susy FrizzleMatt is this printed and in my box ??

## 2014-06-29 NOTE — Telephone Encounter (Signed)
I have ordered a new nebulizer machine for pt. Dr. Cleta Albertsaub can print and sign and leave in nurses box. Thanks. This other form may be in your box.

## 2014-06-29 NOTE — Telephone Encounter (Signed)
I will see if Dr. Katrinka BlazingSmith can sign this since Dr. Cleta Albertsaub is not here for a couple of days

## 2014-06-29 NOTE — Telephone Encounter (Signed)
The nebulizer machine ordered and pended, just sign and it should print out where you are. Raydon requested this in previous message. He needs another machine. I have to find the paperwork lisa sent from [facility]. I dont know what a cms 484 form is.

## 2014-07-06 ENCOUNTER — Other Ambulatory Visit: Payer: Self-pay | Admitting: Emergency Medicine

## 2014-07-11 ENCOUNTER — Encounter: Payer: Self-pay | Admitting: Emergency Medicine

## 2014-07-11 ENCOUNTER — Ambulatory Visit (INDEPENDENT_AMBULATORY_CARE_PROVIDER_SITE_OTHER): Payer: Medicare Other | Admitting: Emergency Medicine

## 2014-07-11 VITALS — BP 120/56 | HR 100 | Temp 99.2°F | Resp 16 | Wt 345.0 lb

## 2014-07-11 DIAGNOSIS — H66011 Acute suppurative otitis media with spontaneous rupture of ear drum, right ear: Secondary | ICD-10-CM

## 2014-07-11 DIAGNOSIS — R0602 Shortness of breath: Secondary | ICD-10-CM

## 2014-07-11 DIAGNOSIS — G4733 Obstructive sleep apnea (adult) (pediatric): Secondary | ICD-10-CM

## 2014-07-11 DIAGNOSIS — E1140 Type 2 diabetes mellitus with diabetic neuropathy, unspecified: Secondary | ICD-10-CM | POA: Diagnosis not present

## 2014-07-11 DIAGNOSIS — I878 Other specified disorders of veins: Secondary | ICD-10-CM

## 2014-07-11 DIAGNOSIS — E1149 Type 2 diabetes mellitus with other diabetic neurological complication: Secondary | ICD-10-CM

## 2014-07-11 DIAGNOSIS — L03115 Cellulitis of right lower limb: Secondary | ICD-10-CM | POA: Diagnosis not present

## 2014-07-11 LAB — BASIC METABOLIC PANEL
BUN: 25 mg/dL — ABNORMAL HIGH (ref 6–23)
CHLORIDE: 96 meq/L (ref 96–112)
CO2: 29 meq/L (ref 19–32)
Calcium: 8.6 mg/dL (ref 8.4–10.5)
Creat: 0.85 mg/dL (ref 0.50–1.35)
Glucose, Bld: 169 mg/dL — ABNORMAL HIGH (ref 70–99)
Potassium: 4.6 mEq/L (ref 3.5–5.3)
Sodium: 136 mEq/L (ref 135–145)

## 2014-07-11 LAB — GLUCOSE, POCT (MANUAL RESULT ENTRY): POC Glucose: 194 mg/dl — AB (ref 70–99)

## 2014-07-11 LAB — POCT GLYCOSYLATED HEMOGLOBIN (HGB A1C): Hemoglobin A1C: 10.1

## 2014-07-11 MED ORDER — AMOXICILLIN-POT CLAVULANATE 875-125 MG PO TABS: 1.0000 | ORAL_TABLET | Freq: Two times a day (BID) | ORAL | Status: DC

## 2014-07-11 NOTE — Progress Notes (Addendum)
   Subjective:  This chart was scribed for Tyrone GobbleSteven A Gino Garrabrant, MD by Elon SpannerGarrett Cook, Medical Scribe. This patient was seen in room 23 and the patient's care was started at 1:13 PM.   Patient ID: Tyrone Cunningham, male    DOB: 03/03/1955, 59 y.o.   MRN: [id number]  HPI HPI Comments: Tyrone Cunningham is a 59 y.o. male with a history of HTN, DM and tracheotomy who is wheelchair bound and [facility].  He reports forgetting to take his DM medication regularly and rarely checks his blood sugar.  He notes some decreased sensation in his bilateral feet at times.  He uses at home oxygen except when he leaves the house at which time he uses Spiriva and Advair, allowing him to manage well for several hours without supplemental oxygen.    He reports some right ear pain and drainage onset 1.5 weeks ago that is no longer painful and only produce slight drainage.     He reports a family history of alzheimer's in the mother.   He lives with his girlfriend.  His brother recently had heart surgery.    Review of Systems     Objective:   Physical Exam  Nursing note and vitals reviewed.  CONSTITUTIONAL: Well developed/well nourished HEAD: Normocephalic/atraumatic EYES: EOMI/PERRL ENMT: Mucous membranes moist NECK: supple no meningeal signs there is tracheostomy tube. SPINE/BACK:entire spine nontender CV: S1/S2 noted, no murmurs/rubs/gallops noted LUNGS: Lungs are clear to auscultation bilaterally, no apparent distress there are decreased breath sounds in both bases ABDOMEN: soft, nontender, no rebound or guarding, bowel sounds noted throughout abdomen GU:no cva tenderness NEURO: Pt is awake/alert/appropriate, moves all extremitiesx4.  No facial droop.   EXTREMITIES: pulses normal/equal, full ROM there are no ulcerations. There is callus formation over the medial portion of both heels. SKIN: warm, color normal PSYCH: no abnormalities of mood noted, alert and oriented to situation Results for  orders placed or performed in visit on 07/11/14  POCT glucose (manual entry)  Result Value Ref Range   POC Glucose 194 (A) 70 - 99 mg/dl  POCT glycosylated hemoglobin (Hb A1C)  Result Value Ref Range   Hemoglobin A1C 10.1        Assessment & Plan:   Diabetes is under terrible control. He does not check his sugars. He does not watch his diet. It is unclear whether reaction he takes his medication on schedule. He does not have the money to afford the proper foods  To eat. He states he will try to do better.I personally performed the services described in this documentation, which was scribed in my presence. The recorded information has been reviewed and is accurate.  Earl LitesSteve Tarun Patchell, MD

## 2014-07-13 ENCOUNTER — Other Ambulatory Visit: Payer: Self-pay | Admitting: Physician Assistant

## 2014-07-14 ENCOUNTER — Other Ambulatory Visit: Payer: Self-pay | Admitting: Emergency Medicine

## 2014-07-21 ENCOUNTER — Other Ambulatory Visit: Payer: Self-pay | Admitting: Emergency Medicine

## 2014-07-21 DIAGNOSIS — Z23 Encounter for immunization: Secondary | ICD-10-CM | POA: Diagnosis not present

## 2014-07-25 ENCOUNTER — Ambulatory Visit: Payer: Medicare Other | Admitting: Emergency Medicine

## 2014-08-21 ENCOUNTER — Other Ambulatory Visit: Payer: Self-pay | Admitting: Emergency Medicine

## 2014-08-21 DIAGNOSIS — Z79899 Other long term (current) drug therapy: Secondary | ICD-10-CM | POA: Diagnosis not present

## 2014-08-21 DIAGNOSIS — G629 Polyneuropathy, unspecified: Secondary | ICD-10-CM | POA: Diagnosis not present

## 2014-08-21 DIAGNOSIS — M542 Cervicalgia: Secondary | ICD-10-CM | POA: Diagnosis not present

## 2014-08-21 DIAGNOSIS — M1990 Unspecified osteoarthritis, unspecified site: Secondary | ICD-10-CM | POA: Diagnosis not present

## 2014-08-21 DIAGNOSIS — M79643 Pain in unspecified hand: Secondary | ICD-10-CM | POA: Diagnosis not present

## 2014-08-21 DIAGNOSIS — G894 Chronic pain syndrome: Secondary | ICD-10-CM | POA: Diagnosis not present

## 2014-08-24 ENCOUNTER — Other Ambulatory Visit: Payer: Self-pay | Admitting: Emergency Medicine

## 2014-09-02 ENCOUNTER — Other Ambulatory Visit: Payer: Self-pay | Admitting: Emergency Medicine

## 2014-09-12 DIAGNOSIS — Z930 Tracheostomy status: Secondary | ICD-10-CM | POA: Diagnosis not present

## 2014-09-12 DIAGNOSIS — H6611 Chronic tubotympanic suppurative otitis media, right ear: Secondary | ICD-10-CM | POA: Diagnosis not present

## 2014-09-12 DIAGNOSIS — Z430 Encounter for attention to tracheostomy: Secondary | ICD-10-CM | POA: Diagnosis not present

## 2014-09-12 DIAGNOSIS — H7201 Central perforation of tympanic membrane, right ear: Secondary | ICD-10-CM | POA: Diagnosis not present

## 2014-09-26 ENCOUNTER — Telehealth: Payer: Self-pay | Admitting: Emergency Medicine

## 2014-09-26 NOTE — Telephone Encounter (Signed)
Mr. Costlow came and dropped off paperwork for him to receive a bus pass. This needs to be filled out by Dr. Cleta Alberts. He'd like a phone call when the paperwork is ready. I'll place the form in the Nurse's Box in 102. Pt ph# [phone] Thank you.

## 2014-10-05 ENCOUNTER — Other Ambulatory Visit: Payer: Self-pay | Admitting: Emergency Medicine

## 2014-10-07 ENCOUNTER — Other Ambulatory Visit: Payer: Self-pay | Admitting: Physician Assistant

## 2014-10-10 ENCOUNTER — Other Ambulatory Visit: Payer: Self-pay | Admitting: Emergency Medicine

## 2014-10-10 DIAGNOSIS — H7201 Central perforation of tympanic membrane, right ear: Secondary | ICD-10-CM | POA: Diagnosis not present

## 2014-10-11 ENCOUNTER — Telehealth: Payer: Self-pay

## 2014-10-11 NOTE — Telephone Encounter (Signed)
Tyrone Cunningham from [facility] states pt put in an application to them and brought the form filled out by the Dr, it is a few things missing on it and they cannot accept Have questions. Please call [phone]

## 2014-10-12 NOTE — Telephone Encounter (Signed)
Spoke with Victorino Dike and advised explanation to why Arjay is unable to ride [employer].

## 2014-10-16 DIAGNOSIS — G5600 Carpal tunnel syndrome, unspecified upper limb: Secondary | ICD-10-CM | POA: Diagnosis not present

## 2014-10-16 DIAGNOSIS — M542 Cervicalgia: Secondary | ICD-10-CM | POA: Diagnosis not present

## 2014-10-16 DIAGNOSIS — Z79899 Other long term (current) drug therapy: Secondary | ICD-10-CM | POA: Diagnosis not present

## 2014-10-16 DIAGNOSIS — G629 Polyneuropathy, unspecified: Secondary | ICD-10-CM | POA: Diagnosis not present

## 2014-10-16 DIAGNOSIS — G894 Chronic pain syndrome: Secondary | ICD-10-CM | POA: Diagnosis not present

## 2014-10-16 DIAGNOSIS — M1990 Unspecified osteoarthritis, unspecified site: Secondary | ICD-10-CM | POA: Diagnosis not present

## 2014-10-23 ENCOUNTER — Other Ambulatory Visit: Payer: Self-pay | Admitting: Emergency Medicine

## 2014-10-24 ENCOUNTER — Ambulatory Visit: Payer: Medicare Other | Admitting: Emergency Medicine

## 2014-10-26 ENCOUNTER — Encounter: Payer: Self-pay | Admitting: Emergency Medicine

## 2014-10-26 ENCOUNTER — Ambulatory Visit (INDEPENDENT_AMBULATORY_CARE_PROVIDER_SITE_OTHER): Payer: Medicare Other | Admitting: Emergency Medicine

## 2014-10-26 DIAGNOSIS — G4733 Obstructive sleep apnea (adult) (pediatric): Secondary | ICD-10-CM

## 2014-10-26 DIAGNOSIS — E1140 Type 2 diabetes mellitus with diabetic neuropathy, unspecified: Secondary | ICD-10-CM | POA: Diagnosis not present

## 2014-10-26 DIAGNOSIS — R0602 Shortness of breath: Secondary | ICD-10-CM

## 2014-10-26 DIAGNOSIS — R509 Fever, unspecified: Secondary | ICD-10-CM | POA: Diagnosis not present

## 2014-10-26 DIAGNOSIS — E1149 Type 2 diabetes mellitus with other diabetic neurological complication: Secondary | ICD-10-CM

## 2014-10-26 LAB — POCT CBC
Granulocyte percent: 79.2 %G (ref 37–80)
HCT, POC: 54.7 % — AB (ref 43.5–53.7)
HEMOGLOBIN: 16.3 g/dL (ref 14.1–18.1)
LYMPH, POC: 1.5 (ref 0.6–3.4)
MCH, POC: 25.4 pg — AB (ref 27–31.2)
MCHC: 29.8 g/dL — AB (ref 31.8–35.4)
MCV: 85.2 fL (ref 80–97)
MID (cbc): 0.5 (ref 0–0.9)
MPV: 7.3 fL (ref 0–99.8)
PLATELET COUNT, POC: 203 10*3/uL (ref 142–424)
POC Granulocyte: 7.8 — AB (ref 2–6.9)
POC LYMPH %: 15.4 % (ref 10–50)
POC MID %: 5.4 %M (ref 0–12)
RBC: 6.42 M/uL — AB (ref 4.69–6.13)
RDW, POC: 17.5 %
WBC: 9.9 10*3/uL (ref 4.6–10.2)

## 2014-10-26 LAB — COMPLETE METABOLIC PANEL WITH GFR
ALBUMIN: 3.3 g/dL — AB (ref 3.6–5.1)
ALK PHOS: 118 U/L — AB (ref 40–115)
ALT: 18 U/L (ref 9–46)
AST: 12 U/L (ref 10–35)
BILIRUBIN TOTAL: 0.4 mg/dL (ref 0.2–1.2)
BUN: 13 mg/dL (ref 7–25)
CALCIUM: 9 mg/dL (ref 8.6–10.3)
CO2: 30 mmol/L (ref 20–31)
Chloride: 95 mmol/L — ABNORMAL LOW (ref 98–110)
Creat: 0.83 mg/dL (ref 0.70–1.25)
GFR, Est African American: >89 mL/min (ref >=60)
GFR, Est Non African American: >89 mL/min (ref >=60)
Glucose, Bld: 315 mg/dL — ABNORMAL HIGH (ref 65–99)
POTASSIUM: 5.2 mmol/L (ref 3.5–5.3)
Sodium: 135 mmol/L (ref 135–146)
TOTAL PROTEIN: 6.8 g/dL (ref 6.1–8.1)

## 2014-10-26 LAB — GLUCOSE, POCT (MANUAL RESULT ENTRY): POC Glucose: 368 mg/dl — AB (ref 70–99)

## 2014-10-26 LAB — POCT GLYCOSYLATED HEMOGLOBIN (HGB A1C): HEMOGLOBIN A1C: 10.3

## 2014-10-26 LAB — LIPID PANEL
CHOL/HDL RATIO: 4.1 ratio (ref <=5.0)
CHOLESTEROL: 134 mg/dL (ref 125–200)
HDL: 33 mg/dL — ABNORMAL LOW (ref >=40)
LDL Cholesterol: 78 mg/dL (ref <130)
Triglycerides: 113 mg/dL (ref <150)
VLDL: 23 mg/dL (ref <30)

## 2014-10-26 MED ORDER — DOXYCYCLINE HYCLATE 100 MG PO TABS: 100.0000 mg | ORAL_TABLET | Freq: Two times a day (BID) | ORAL | Status: DC

## 2014-10-26 NOTE — Progress Notes (Addendum)
Patient ID: Tyrone Cunningham, male   DOB: 01-16-1956, 59 y.o.   MRN: [id number]    This chart was scribed for Tyrone Lites, MD by [facility], medical scribe at [facility].The patient was seen in exam room 22 and the patient's care was started at 1:34 PM.  Chief Complaint:  Chief Complaint  Patient presents with  . Diabetes   HPI: Tyrone Cunningham is a 59 y.o. male with a history of HTN, DM and tracheotomy who is wheelchair bound and reports to [facility] today for a diabetes follow up. He takes 100 units of lantus daily. Last A1C was 10.1 at 07/11/2014. Diet is not as good as it should be. Girlfriend is stressing him out, she has sporadic anger spells. Has not smoked for 15 years. He has cellulitis of the both legs, occasional soreness and pain.  Past Medical History  Diagnosis Date  . Bronchitis   . CHF (congestive heart failure)   . DM type 2 with diabetic peripheral neuropathy   . Hypertension   . Hypercholesteremia   . Hearing loss   . Peripheral neuropathy   . Sleep apnea     Severe sleep apnea requiring tracheostomy  . COPD (chronic obstructive pulmonary disease)    Past Surgical History  Procedure Laterality Date  . Tracheostomy    . Appendectomy    . Hand surgery    . Femur surgery    . Multiple extractions with alveoloplasty N/A 05/11/2012    Procedure: MULTIPLE EXTRACTION WITH ALVEOLOPLASTY;  Surgeon: [name], DDS;  Location: [facility] OR;  Service: Oral Surgery;  Laterality: N/A;  . Femur hardware removal  09/01/2003    Removal of retained hardware, two interlocking distal femoral  . Cholecystectomy N/A 01/05/2014    Procedure: LAPAROSCOPIC CHOLECYSTECTOMY;  Surgeon: Emelia Loron, MD;  Location: [facility] ORS;  Service: General;  Laterality: N/A;   Social History   Social History  . Marital Status: Single    Spouse Name: N/A  . Number of Children: N/A  . Years of Education: N/A   Social History Main Topics  . Smoking status: Former Smoker -- 2.00 packs/day for 30  years    Types: Cigarettes    Quit date: 07/02/1999  . Smokeless tobacco: Never Used  . Alcohol Use: No  . Drug Use: No  . Sexual Activity: No   Other Topics Concern  . None   Social History Narrative   Family History  Problem Relation Age of Onset  . Coronary artery disease Father    Not on File Prior to Admission medications   Medication Sig Start Date End Date Taking? Authorizing Provider  ADVAIR DISKUS 500-50 MCG/DOSE AEPB INHALE 1 PUFF BY MOUTH INTO THE LUNGS TWICE DAILY 04/17/14   Collene Gobble, MD  albuterol (PROVENTIL HFA;VENTOLIN HFA) 108 (90 BASE) MCG/ACT inhaler Inhale 2 puffs into the lungs every 6 (six) hours as needed for wheezing or shortness of breath (shortness of breath).    Historical Provider, MD  albuterol (PROVENTIL) (2.5 MG/3ML) 0.083% nebulizer solution Take 3 mLs (2.5 mg total) by nebulization 4 (four) times daily. 05/22/14   Collene Gobble, MD  amoxicillin-clavulanate (AUGMENTIN) 875-125 MG per tablet Take 1 tablet by mouth 2 (two) times daily. 07/11/14   Collene Gobble, MD  atorvastatin (LIPITOR) 10 MG tablet Take 1 tablet (10 mg total) by mouth daily. PATIENT NEEDS OFFICE VISIT/FASTING LABS FOR ADDITIONAL REFILLS 08/25/14   Collene Gobble, MD  furosemide (LASIX) 40 MG  tablet TAKE 1 TABLET BY MOUTH TWICE DAILY 09/05/14   Collene Gobble, MD  gabapentin (NEURONTIN) 300 MG capsule TAKE 3 TO 4 CAPSULES BY MOUTH EVERY NIGHT AT BEDTIME AS NEEDED FOR PAIN 10/11/14   Morrell Riddle, PA-C  GRALISE 600 MG TABS Take 3 tablets by mouth every morning.    Historical Provider, MD  insulin aspart (NOVOLOG) 100 UNIT/ML injection Inject 21 Units into the skin 3 (three) times daily before meals.     Historical Provider, MD  insulin glargine (LANTUS) 100 UNIT/ML injection Inject 90 Units into the skin daily.    Historical Provider, MD  insulin regular (NOVOLIN R) 100 units/mL injection Inject 21 units four times daily before meals and as needed per sliding scale. PATIENT NEEDS OFFICE VISIT  FOR ADDITIONAL REFILLS 10/06/14   Collene Gobble, MD  Insulin Syringe-Needle U-100 (INSULIN SYRINGE .3CC/31GX5/16") 31G X 5/16" 0.3 ML MISC USE AS DIRECTED 03/29/14   Chelle Jeffery, PA-C  lisinopril (PRINIVIL,ZESTRIL) 5 MG tablet TAKE 1 TABLET BY MOUTH DAILY 07/21/14   Collene Gobble, MD  morphine (MSIR) 15 MG tablet Take 1 tablet (15 mg total) by mouth every 6 (six) hours as needed for severe pain. Patient not taking: Reported on 07/11/2014 01/07/14   Nishant Dhungel, MD  oxyCODONE-acetaminophen (PERCOCET) 10-325 MG per tablet Take 1 tablet by mouth every 6 (six) hours as needed for pain (pain).     Historical Provider, MD  polyethylene glycol powder (GLYCOLAX/MIRALAX) powder MIX 1 CAPFUL IN 8 OUNCES OF LIQUID EVERY DAY 04/30/14   Collene Gobble, MD  senna-docusate (SENOKOT-S) 8.6-50 MG per tablet Take 2 tablets by mouth at bedtime. Patient not taking: Reported on 07/11/2014 01/07/14   Theda Belfast Dhungel, MD  SPIRIVA HANDIHALER 18 MCG inhalation capsule INHALE THE CONTENTS OF 1 CAPSULE VIA HANDIHALER BY MOUTH ONCE DAILY AS DIRECTED 10/09/14   Collene Gobble, MD  spironolactone (ALDACTONE) 25 MG tablet TAKE 1 TABLET BY MOUTH EVERY DAY 10/24/14   Chelle Jeffery, PA-C  traMADol (ULTRAM) 50 MG tablet Take 100 mg by mouth every 6 (six) hours as needed for severe pain (pain).     Historical Provider, MD  UNABLE TO FIND Nebulizer machine DX: J44.9, I50.9 06/29/14   Ethelda Chick, MD  VOLTAREN 1 % GEL Apply 2 g topically daily as needed (hand pain).     Historical Provider, MD   ROS: The patient denies fevers, chills, night sweats, unintentional weight loss, chest pain, palpitations, wheezing, dyspnea on exertion, nausea, vomiting, abdominal pain, dysuria, hematuria, melena, numbness, weakness, or tingling.  All other systems have been reviewed and were otherwise negative with the exception of those mentioned in the HPI and as above.    PHYSICAL EXAM: Filed Vitals:   10/26/14 1325  BP: 154/86  Pulse: 112  Temp: 100  F (37.8 C)  Resp: 22   There is no weight on file to calculate BMI.  General: Alert, no acute distress HEENT:  Normocephalic, atraumatic, oropharynx patent. There is a tracheostomy tube with some purulent discharge in the stoma Poor dentition   Eye: E[facility], [facility] Cardiovascular:  Regular rate and rhythm, no rubs murmurs or gallops.  No Carotid bruits, radial pulse intact. No pedal edema.  Respiratory: decreased breath sounds in the bases. No wheezes, rales, or rhonchi.  No cyanosis, no use of accessory musculature Abdominal: No organomegaly, abdomen is soft and non-tender, positive bowel sounds.  protuberant without masses. Musculoskeletal: Gait intact. No edema, tenderness, bilateral severe stasis changes with  redness around the stasis changes. No sores on his feet. Multiple sores on the rest of this feet. Skin: No rashes. Neurologic: Facial musculature symmetric. Psychiatric: Patient acts appropriately throughout our interaction. Lymphatic: No cervical or submandibular lymphadenopathy  LABS: Results for orders placed or performed in visit on 10/26/14  POCT glucose (manual entry)  Result Value Ref Range   POC Glucose 368 (A) 70 - 99 mg/dl  POCT CBC  Result Value Ref Range   WBC 9.9 4.6 - 10.2 K/uL   Lymph, poc 1.5 0.6 - 3.4   POC LYMPH PERCENT 15.4 10 - 50 %L   MID (cbc) 0.5 0 - 0.9   POC MID % 5.4 0 - 12 %M   POC Granulocyte 7.8 (A) 2 - 6.9   Granulocyte percent 79.2 37 - 80 %G   RBC 6.42 (A) 4.69 - 6.13 M/uL   Hemoglobin 16.3 14.1 - 18.1 g/dL   HCT, POC 16.1 (A) 09.6 - 53.7 %   MCV 85.2 80 - 97 fL   MCH, POC 25.4 (A) 27 - 31.2 pg   MCHC 29.8 (A) 31.8 - 35.4 g/dL   RDW, POC 04.5 %   Platelet Count, POC 203 142 - 424 K/uL   MPV 7.3 0 - 99.8 fL  POCT glycosylated hemoglobin (Hb A1C)  Result Value Ref Range   Hemoglobin A1C 10.3    ASSESSMENT/PLAN: His temp today was a little higher than usual. There was mild redness around his stasis changes. I did put him on  doxycycline for 10 days to help with this. Blood sugar remains out of control. His diet and weight are significant issues.I personally performed the services described in this documentation, which was scribed in my presence. The recorded information has been reviewed and is accurate. Gross sideeffects, risk and benefits, and alternatives of medications d/w patient. Patient is aware that all medications have potential sideeffects and we are unable to predict every sideeffect or drug-drug interaction that may occur.I personally performed the services described in this documentation, which was scribed in my presence. The recorded information has been reviewed and is accurate.   [name] MD 10/26/2014 1:34 PM

## 2014-10-27 LAB — MICROALBUMIN, URINE: MICROALB UR: 248.7 mg/dL — AB (ref <2.0)

## 2014-10-28 LAB — HEMOGLOBIN A1C: HEMOGLOBIN A1C: 10.3 % — AB (ref 4.0–6.0)

## 2014-11-05 ENCOUNTER — Other Ambulatory Visit: Payer: Self-pay | Admitting: Emergency Medicine

## 2014-11-06 ENCOUNTER — Other Ambulatory Visit: Payer: Self-pay | Admitting: Emergency Medicine

## 2014-11-08 ENCOUNTER — Other Ambulatory Visit: Payer: Self-pay | Admitting: *Deleted

## 2014-11-08 DIAGNOSIS — E1165 Type 2 diabetes mellitus with hyperglycemia: Secondary | ICD-10-CM

## 2014-11-08 DIAGNOSIS — IMO0002 Reserved for concepts with insufficient information to code with codable children: Secondary | ICD-10-CM

## 2014-11-08 NOTE — Patient Outreach (Signed)
[facility]) Care Management  11/08/2014  Tyrone Cunningham 11-Feb-1956 [id number]   MD referral:     Telephone call to patient who was advised of reason for call and of [facility] care management services.  HIPPA verification received.  Patient voices he has no health care concerns at this time but does voice that diabetes is out of control.  States he does not wish to talk about blood sugar levels. States he has had diabetic education training and  self administers insulin several times daily when he remembers to do it.  States his major concern now is to get a friend out of his home whom he has tried to help. Patient admits that he is stressed about the situation.  Patient is declining any type of health care teaching regarding his diabetic condition at this time. Patient declines depression screening.   Recommendations made to patient regarding referral to community clinical social worker regarding his stress level. Patient states he would  be willing to take a call and or possibly seeing social worker some where other than his home. States she does not wish to have anyone come to his home.   Plan: will refer to [name] to refer to clinical social worker.    Colleen Can, RN BSN CCM Care Management Coordinator [facility] Care Management  [phone]

## 2014-11-09 NOTE — Patient Outreach (Signed)
[facility]) Care Management  11/09/2014  Atha Mcbain [birth year] [id number]   Request from Colleen Can, RN to assign SW, assigned [employer], LCSW.  Thanks, Corrie Mckusick. Sharlee Blew [facility] Care Management [facility] CM Assistant Phone: [phone] Fax: [phone]

## 2014-11-13 ENCOUNTER — Other Ambulatory Visit: Payer: Self-pay | Admitting: Physician Assistant

## 2014-11-14 ENCOUNTER — Other Ambulatory Visit: Payer: Self-pay | Admitting: *Deleted

## 2014-11-14 NOTE — Patient Outreach (Addendum)
[facility]) Care Management  11/14/2014  [name] [birth year] [id number]  [name] received a new referral on patient from Colleen Can, Telephonic Nurse Case Manager with [facility] Care Management, indicating that patient may benefit from social work services to assist with coping with feelings of anxiety and depression.  Mrs. Tyrone Cunningham went on to say that patient is currently living in a stressful home environment with his roommate, in addition to being stressed about his current medical conditions (Heart Failure, Chronic Obstructive Pulmonary Disease, Uncontrolled Diabetes Mellitus, Type II).  Mrs. Tyrone Cunningham reports that patient is wheelchair bound, with limited ability to leave his home. [name] made an initial attempt to try and contact patient today to perform phone assessment, as well as assess and assist with social work needs and services, without success.  A HIPAA compliant message was left for patient on voicemail.  [name] is currently awaiting a return call.  [name] will fax a barriers letter to patient's Primary Care Physician, Dr. [name].  [name] will prescribe and print EMMI information to review with patient at the initial home visit.  Danford Bad, BSW, MSW, LCSW  Licensed Restaurant manager, fast food [facility]  Mailing [place] [address] [place], [place] [address] Physical Address-[address], [place], [place] [address] Toll Free Main # [phone] Fax # [phone] Cell # [phone]  Fax # [phone]  [email or web address]

## 2014-11-18 ENCOUNTER — Other Ambulatory Visit: Payer: Self-pay | Admitting: Emergency Medicine

## 2014-11-20 ENCOUNTER — Other Ambulatory Visit: Payer: Self-pay

## 2014-11-20 ENCOUNTER — Other Ambulatory Visit: Payer: Self-pay | Admitting: *Deleted

## 2014-11-20 NOTE — Telephone Encounter (Signed)
Pt states he needs a hand held bed side urinal.  Please advise  [phone]

## 2014-11-20 NOTE — Patient Outreach (Signed)
[facility]) Care Management  11/20/2014  Tyrone Cunningham 24-Dec-1955 [id number]  [name] made a second attempt to try and contact patient today to perform phone assessment, as well as assess and assist with social work needs and services, without success. A HIPAA compliant message was left on voicemail.  [name] is currently awaiting a return call.  [name] received a message from patient's Primary Care Physician, Dr. Lesle Cunningham indicating that "patient is able to get around using a motorized wheelchair. He does not have money for proper food and is very nonadherent to dietary issues. His roommate creates a stressful home environment".  [name] will continue to try and make contact with patient to address these issues.  Danford Bad, BSW, MSW, LCSW  Licensed Restaurant manager, fast food [facility]  Mailing [place] [address] [place], [place] [address] Physical Address[address], [place], [place] [address] Toll Free Main # [phone] Fax # [phone] Cell # [phone]  Fax # [phone]  [email or web address]

## 2014-11-21 MED ORDER — UNABLE TO FIND
Status: DC
Start: 2014-11-21 — End: 2016-09-13

## 2014-11-21 NOTE — Telephone Encounter (Signed)
That is fine for [place] to order that but I don't know how it should be ordered. Please help me with this

## 2014-11-21 NOTE — Telephone Encounter (Signed)
Spoke to pt and advised we will send in a Rx for urinal for him. He verified that he would like the Rx to be faxed to [facility] at [phone]. Dr Cleta Alberts, I have pended the Rx. Can you please print, sign, and have someone fax to [facility]? 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 If you would like to change or add to that, please do so before printing. Thank you.

## 2014-11-22 ENCOUNTER — Other Ambulatory Visit: Payer: Self-pay | Admitting: *Deleted

## 2014-11-22 NOTE — Telephone Encounter (Signed)
Faxed Rx

## 2014-11-22 NOTE — Patient Outreach (Signed)
[facility]) Care Management  11/22/2014  Tyrone Cunningham [birth year] [id number]   [name] has not been successful, in her many attempts, to get in contact with patient by phone to perform the initial phone assessment, as well as assess and assist with social work needs and services.  Therefore, [name] mailed patient a packet of resource information, including all of the following contents:  Prescribed and printed EMMI information pertaining specifically to Heart Failure, Chronic Obstructive Pulmonary Disease, Diabetes, Signs & Symptoms of Depression, and Anxiety-Related Treatment Options A List of [employer] and Resources that Assist with Finances "[employer] Book - Free Meals in [place]" A complete listing of all counseling services and resources in [place] that accept Medicare and Medicaid with a concentration on Depression and Anxiety Disorders A consent to receive case management services through [name] with [employer] Care Management [name]'s business card, including all contact information.  [name] will continue to await a return call from patient.  [name] will fax a correspondence letter to patient's Primary Care Physician, Dr. [name] to ensure that Dr. Cleta Alberts is aware of [name]'s involvement with patient's care.  Danford Bad, BSW, MSW, LCSW  Licensed Restaurant manager, fast food [facility]  Mailing [place] [address], [place], [place] [address] Physical Address[address], [place], [place] [address] Toll Free Main # [phone] Fax # [phone] Cell # [phone]  Fax # [phone]  [email or web address]

## 2014-11-27 ENCOUNTER — Other Ambulatory Visit: Payer: Self-pay | Admitting: *Deleted

## 2014-11-28 ENCOUNTER — Encounter: Payer: Self-pay | Admitting: *Deleted

## 2014-11-28 NOTE — Patient Outreach (Addendum)
[facility]) Care Management  11/28/2014  Tyrone Cunningham 09/28/1955 [id number]   [name] made a forth and final attempt to try and contact patient today to perform phone assessment, as well as assess and assist with social work needs and services, without success.  A HIPAA compliant message was left for patient on voicemail.  [name] continues to await a return call.  [name] will mail an outreach letter to patient's home, encouraging patient to contact [name] at his earliest convenience, if patient is interested in receiving social work services and resources through [name] with [employer].  If [name] does not receive a return call from patient within the next 10 business days, [name] will proceed with case closure.  Required number of phone attempts will have been made and outreach letter mailed.   In the message left for patient, [name] explained that [name] mailed a packet of information to patient's home, including all of the following information: Prescribed and printed EMMI information pertaining specifically to Heart Failure, Chronic Obstructive Pulmonary Disease, Diabetes, Signs & Symptoms of Depression, and Anxiety-Related Treatment Options A List of [employer] and Resources that Assist with Finances "[employer] Book - Free Meals in [place]" A complete listing of all counseling services and resources in [place] that accept Medicare and Medicaid with a concentration on Depression and Anxiety Disorders A consent to receive case management services through [name] with [employer] Care Management [name]'s business card, including all contact information. [name] is hopeful that patient will make the connection and return [name]'s call.  In the meantime, [name] will fax a correspondence letter to patient's Primary Care Physician, Dr. [name].  [name] will also notify patient's Telephonic Nurse Case Manager, also with [facility] Care Management, Colleen Can.  Danford Bad, BSW, MSW, LCSW  Licensed Restaurant manager, fast food [facility]  Mailing [place] [address] [place], [place] [address] Physical Address-[address], [place], [place] [address] Toll Free Main # [phone] Fax # [phone] Cell # [phone]  Fax # [phone]  [email or web address]

## 2014-11-30 NOTE — Patient Outreach (Signed)
[facility]) Care Management  11/30/2014  Tyrone Cunningham 05/27/1955 [id number]   Notification from Danford Bad, LCSW to close case due to patient refused [facility] Care Management services.  Thanks, Corrie Mckusick. Sharlee Blew [facility] Care Management [facility] CM Assistant Phone: [phone] Fax: [phone]

## 2014-12-01 ENCOUNTER — Other Ambulatory Visit: Payer: Self-pay | Admitting: Physician Assistant

## 2014-12-05 ENCOUNTER — Encounter: Payer: Self-pay | Admitting: Emergency Medicine

## 2014-12-05 DIAGNOSIS — Z23 Encounter for immunization: Secondary | ICD-10-CM | POA: Diagnosis not present

## 2014-12-10 ENCOUNTER — Other Ambulatory Visit: Payer: Self-pay | Admitting: Emergency Medicine

## 2014-12-11 ENCOUNTER — Other Ambulatory Visit: Payer: Self-pay | Admitting: Physician Assistant

## 2014-12-11 DIAGNOSIS — M792 Neuralgia and neuritis, unspecified: Secondary | ICD-10-CM | POA: Diagnosis not present

## 2014-12-11 DIAGNOSIS — M545 Low back pain: Secondary | ICD-10-CM | POA: Diagnosis not present

## 2014-12-11 DIAGNOSIS — Z79899 Other long term (current) drug therapy: Secondary | ICD-10-CM | POA: Diagnosis not present

## 2014-12-11 DIAGNOSIS — G894 Chronic pain syndrome: Secondary | ICD-10-CM | POA: Diagnosis not present

## 2014-12-21 DIAGNOSIS — Z930 Tracheostomy status: Secondary | ICD-10-CM | POA: Diagnosis not present

## 2014-12-21 DIAGNOSIS — Z430 Encounter for attention to tracheostomy: Secondary | ICD-10-CM | POA: Diagnosis not present

## 2014-12-21 DIAGNOSIS — H7202 Central perforation of tympanic membrane, left ear: Secondary | ICD-10-CM | POA: Diagnosis not present

## 2014-12-25 ENCOUNTER — Encounter: Payer: Self-pay | Admitting: Family Medicine

## 2015-01-08 DIAGNOSIS — Z79899 Other long term (current) drug therapy: Secondary | ICD-10-CM | POA: Diagnosis not present

## 2015-01-08 DIAGNOSIS — G894 Chronic pain syndrome: Secondary | ICD-10-CM | POA: Diagnosis not present

## 2015-01-08 DIAGNOSIS — M792 Neuralgia and neuritis, unspecified: Secondary | ICD-10-CM | POA: Diagnosis not present

## 2015-01-08 DIAGNOSIS — M545 Low back pain: Secondary | ICD-10-CM | POA: Diagnosis not present

## 2015-01-17 ENCOUNTER — Other Ambulatory Visit: Payer: Self-pay | Admitting: Emergency Medicine

## 2015-01-27 ENCOUNTER — Other Ambulatory Visit: Payer: Self-pay | Admitting: Physician Assistant

## 2015-02-06 ENCOUNTER — Ambulatory Visit: Payer: Medicare Other | Admitting: Emergency Medicine

## 2015-02-06 DIAGNOSIS — M79603 Pain in arm, unspecified: Secondary | ICD-10-CM | POA: Diagnosis not present

## 2015-02-06 DIAGNOSIS — M545 Low back pain: Secondary | ICD-10-CM | POA: Diagnosis not present

## 2015-02-06 DIAGNOSIS — Z79899 Other long term (current) drug therapy: Secondary | ICD-10-CM | POA: Diagnosis not present

## 2015-02-06 DIAGNOSIS — G894 Chronic pain syndrome: Secondary | ICD-10-CM | POA: Diagnosis not present

## 2015-02-06 DIAGNOSIS — M792 Neuralgia and neuritis, unspecified: Secondary | ICD-10-CM | POA: Diagnosis not present

## 2015-02-16 ENCOUNTER — Other Ambulatory Visit: Payer: Self-pay | Admitting: Emergency Medicine

## 2015-02-16 ENCOUNTER — Other Ambulatory Visit: Payer: Self-pay | Admitting: Physician Assistant

## 2015-02-27 ENCOUNTER — Telehealth: Payer: Self-pay | Admitting: Family Medicine

## 2015-02-27 NOTE — Telephone Encounter (Signed)
Tried to call patient to set up appointment but phone was out of order .  Will send a letter to have him call and set up appointment to follow up on diabetes.

## 2015-03-03 ENCOUNTER — Other Ambulatory Visit: Payer: Self-pay | Admitting: Emergency Medicine

## 2015-03-04 ENCOUNTER — Other Ambulatory Visit: Payer: Self-pay | Admitting: Physician Assistant

## 2015-03-05 ENCOUNTER — Other Ambulatory Visit: Payer: Self-pay | Admitting: Emergency Medicine

## 2015-03-08 DIAGNOSIS — G894 Chronic pain syndrome: Secondary | ICD-10-CM | POA: Diagnosis not present

## 2015-03-08 DIAGNOSIS — M79643 Pain in unspecified hand: Secondary | ICD-10-CM | POA: Diagnosis not present

## 2015-03-08 DIAGNOSIS — Z79899 Other long term (current) drug therapy: Secondary | ICD-10-CM | POA: Diagnosis not present

## 2015-03-08 DIAGNOSIS — M545 Low back pain: Secondary | ICD-10-CM | POA: Diagnosis not present

## 2015-03-16 ENCOUNTER — Other Ambulatory Visit: Payer: Self-pay | Admitting: Physician Assistant

## 2015-03-21 ENCOUNTER — Inpatient Hospital Stay (HOSPITAL_COMMUNITY)
Admission: EM | Admit: 2015-03-21 | Discharge: 2015-03-24 | DRG: 190 | Disposition: A | Discharge status: Home Health | Payer: Medicare Other | Attending: Internal Medicine | Admitting: Internal Medicine

## 2015-03-21 ENCOUNTER — Other Ambulatory Visit: Payer: Self-pay | Admitting: Physician Assistant

## 2015-03-21 ENCOUNTER — Emergency Department (HOSPITAL_COMMUNITY): Payer: Medicare Other

## 2015-03-21 ENCOUNTER — Ambulatory Visit (INDEPENDENT_AMBULATORY_CARE_PROVIDER_SITE_OTHER): Payer: Medicare Other | Admitting: Emergency Medicine

## 2015-03-21 ENCOUNTER — Encounter (HOSPITAL_COMMUNITY): Payer: Self-pay | Admitting: Emergency Medicine

## 2015-03-21 VITALS — BP 144/82 | HR 115 | Temp 99.0°F | Resp 18

## 2015-03-21 DIAGNOSIS — Z7982 Long term (current) use of aspirin: Secondary | ICD-10-CM

## 2015-03-21 DIAGNOSIS — G4733 Obstructive sleep apnea (adult) (pediatric): Secondary | ICD-10-CM

## 2015-03-21 DIAGNOSIS — E7800 Pure hypercholesterolemia, unspecified: Secondary | ICD-10-CM | POA: Diagnosis not present

## 2015-03-21 DIAGNOSIS — Z8249 Family history of ischemic heart disease and other diseases of the circulatory system: Secondary | ICD-10-CM | POA: Diagnosis not present

## 2015-03-21 DIAGNOSIS — J441 Chronic obstructive pulmonary disease with (acute) exacerbation: Secondary | ICD-10-CM

## 2015-03-21 DIAGNOSIS — E785 Hyperlipidemia, unspecified: Secondary | ICD-10-CM | POA: Diagnosis present

## 2015-03-21 DIAGNOSIS — H9190 Unspecified hearing loss, unspecified ear: Secondary | ICD-10-CM | POA: Diagnosis present

## 2015-03-21 DIAGNOSIS — J9621 Acute and chronic respiratory failure with hypoxia: Secondary | ICD-10-CM | POA: Diagnosis present

## 2015-03-21 DIAGNOSIS — E669 Obesity, unspecified: Secondary | ICD-10-CM | POA: Diagnosis present

## 2015-03-21 DIAGNOSIS — R0602 Shortness of breath: Secondary | ICD-10-CM | POA: Diagnosis not present

## 2015-03-21 DIAGNOSIS — E1149 Type 2 diabetes mellitus with other diabetic neurological complication: Secondary | ICD-10-CM

## 2015-03-21 DIAGNOSIS — Z794 Long term (current) use of insulin: Secondary | ICD-10-CM

## 2015-03-21 DIAGNOSIS — Z7951 Long term (current) use of inhaled steroids: Secondary | ICD-10-CM

## 2015-03-21 DIAGNOSIS — J449 Chronic obstructive pulmonary disease, unspecified: Secondary | ICD-10-CM | POA: Diagnosis present

## 2015-03-21 DIAGNOSIS — J4489 Other specified chronic obstructive pulmonary disease: Secondary | ICD-10-CM

## 2015-03-21 DIAGNOSIS — J9610 Chronic respiratory failure, unspecified whether with hypoxia or hypercapnia: Secondary | ICD-10-CM | POA: Diagnosis not present

## 2015-03-21 DIAGNOSIS — J9611 Chronic respiratory failure with hypoxia: Secondary | ICD-10-CM | POA: Diagnosis present

## 2015-03-21 DIAGNOSIS — Z6841 Body Mass Index (BMI) 40.0 and over, adult: Secondary | ICD-10-CM | POA: Diagnosis not present

## 2015-03-21 DIAGNOSIS — R05 Cough: Secondary | ICD-10-CM | POA: Diagnosis not present

## 2015-03-21 DIAGNOSIS — I878 Other specified disorders of veins: Secondary | ICD-10-CM | POA: Diagnosis not present

## 2015-03-21 DIAGNOSIS — R911 Solitary pulmonary nodule: Secondary | ICD-10-CM | POA: Diagnosis present

## 2015-03-21 DIAGNOSIS — E1142 Type 2 diabetes mellitus with diabetic polyneuropathy: Secondary | ICD-10-CM | POA: Diagnosis present

## 2015-03-21 DIAGNOSIS — Z930 Tracheostomy status: Secondary | ICD-10-CM | POA: Diagnosis not present

## 2015-03-21 DIAGNOSIS — I10 Essential (primary) hypertension: Secondary | ICD-10-CM | POA: Diagnosis present

## 2015-03-21 DIAGNOSIS — J189 Pneumonia, unspecified organism: Secondary | ICD-10-CM | POA: Diagnosis not present

## 2015-03-21 DIAGNOSIS — Z87891 Personal history of nicotine dependence: Secondary | ICD-10-CM | POA: Diagnosis not present

## 2015-03-21 DIAGNOSIS — Z79899 Other long term (current) drug therapy: Secondary | ICD-10-CM | POA: Diagnosis not present

## 2015-03-21 DIAGNOSIS — I5032 Chronic diastolic (congestive) heart failure: Secondary | ICD-10-CM | POA: Diagnosis present

## 2015-03-21 DIAGNOSIS — I509 Heart failure, unspecified: Secondary | ICD-10-CM

## 2015-03-21 LAB — COMPREHENSIVE METABOLIC PANEL
ALBUMIN: 2.9 g/dL — AB (ref 3.5–5.0)
ALK PHOS: 110 U/L (ref 38–126)
ALT: 20 U/L (ref 17–63)
AST: 20 U/L (ref 15–41)
Anion gap: 8 (ref 5–15)
BUN: 13 mg/dL (ref 6–20)
CALCIUM: 8.7 mg/dL — AB (ref 8.9–10.3)
CHLORIDE: 95 mmol/L — AB (ref 101–111)
CO2: 30 mmol/L (ref 22–32)
CREATININE: 0.81 mg/dL (ref 0.61–1.24)
GFR calc Af Amer: >60 mL/min (ref >60)
GFR calc non Af Amer: >60 mL/min (ref >60)
GLUCOSE: 197 mg/dL — AB (ref 65–99)
Potassium: 4.5 mmol/L (ref 3.5–5.1)
SODIUM: 133 mmol/L — AB (ref 135–145)
Total Bilirubin: 1.2 mg/dL (ref 0.3–1.2)
Total Protein: 7.3 g/dL (ref 6.5–8.1)

## 2015-03-21 LAB — CBC WITH DIFFERENTIAL/PLATELET
Basophils Absolute: 0 10*3/uL (ref 0.0–0.1)
Basophils Relative: 0 %
EOS ABS: 0.1 10*3/uL (ref 0.0–0.7)
EOS PCT: 1 %
HCT: 52.3 % — ABNORMAL HIGH (ref 39.0–52.0)
HEMOGLOBIN: 17.3 g/dL — AB (ref 13.0–17.0)
LYMPHS ABS: 1.5 10*3/uL (ref 0.7–4.0)
LYMPHS PCT: 14 %
MCH: 30.2 pg (ref 26.0–34.0)
MCHC: 33.1 g/dL (ref 30.0–36.0)
MCV: 91.4 fL (ref 78.0–100.0)
MONOS PCT: 8 %
Monocytes Absolute: 0.9 10*3/uL (ref 0.1–1.0)
NEUTROS PCT: 77 %
Neutro Abs: 8 10*3/uL — ABNORMAL HIGH (ref 1.7–7.7)
Platelets: 200 10*3/uL (ref 150–400)
RBC: 5.72 MIL/uL (ref 4.22–5.81)
RDW: 15.4 % (ref 11.5–15.5)
WBC: 10.5 10*3/uL (ref 4.0–10.5)

## 2015-03-21 LAB — POCT CBC
Granulocyte percent: 80.2 %G — AB (ref 37–80)
HCT, POC: 51.4 % (ref 43.5–53.7)
Hemoglobin: 17.2 g/dL (ref 14.1–18.1)
Lymph, poc: 1.4 (ref 0.6–3.4)
MCH: 29.1 pg (ref 27–31.2)
MCHC: 33.4 g/dL (ref 31.8–35.4)
MCV: 87.1 fL (ref 80–97)
MID (CBC): 0.7 (ref 0–0.9)
MPV: 7.5 fL (ref 0–99.8)
PLATELET COUNT, POC: 226 10*3/uL (ref 142–424)
POC Granulocyte: 8.6 — AB (ref 2–6.9)
POC LYMPH %: 12.9 % (ref 10–50)
POC MID %: 6.9 %M (ref 0–12)
RBC: 5.9 M/uL (ref 4.69–6.13)
RDW, POC: 16.5 %
WBC: 10.7 10*3/uL — AB (ref 4.6–10.2)

## 2015-03-21 LAB — BASIC METABOLIC PANEL
BUN: 12 mg/dL (ref 7–25)
CALCIUM: 8.7 mg/dL (ref 8.6–10.3)
CO2: 31 mmol/L (ref 20–31)
Chloride: 95 mmol/L — ABNORMAL LOW (ref 98–110)
Creat: 0.76 mg/dL (ref 0.70–1.25)
Glucose, Bld: 205 mg/dL — ABNORMAL HIGH (ref 65–99)
Potassium: 4.9 mmol/L (ref 3.5–5.3)
SODIUM: 133 mmol/L — AB (ref 135–146)

## 2015-03-21 LAB — POCT GLYCOSYLATED HEMOGLOBIN (HGB A1C): HEMOGLOBIN A1C: 10.2

## 2015-03-21 LAB — CBG MONITORING, ED: GLUCOSE-CAPILLARY: 152 mg/dL — AB (ref 65–99)

## 2015-03-21 LAB — BRAIN NATRIURETIC PEPTIDE: B Natriuretic Peptide: 164.4 pg/mL — ABNORMAL HIGH (ref 0.0–100.0)

## 2015-03-21 LAB — GLUCOSE, CAPILLARY: GLUCOSE-CAPILLARY: 303 mg/dL — AB (ref 65–99)

## 2015-03-21 LAB — TROPONIN I

## 2015-03-21 LAB — GLUCOSE, POCT (MANUAL RESULT ENTRY): POC GLUCOSE: 231 mg/dL — AB (ref 70–99)

## 2015-03-21 MED ORDER — SODIUM CHLORIDE 0.9 % IJ SOLN
3.0000 mL | Freq: Two times a day (BID) | INTRAMUSCULAR | Status: DC
Start: 2015-03-21 — End: 2015-03-24
  Administered 2015-03-21 – 2015-03-24 (×6): 3 mL via INTRAVENOUS

## 2015-03-21 MED ORDER — ALBUTEROL SULFATE (2.5 MG/3ML) 0.083% IN NEBU
2.5000 mg | INHALATION_SOLUTION | Freq: Once | RESPIRATORY_TRACT | Status: AC
  Administered 2015-03-21: 2.5 mg via RESPIRATORY_TRACT

## 2015-03-21 MED ORDER — SPIRONOLACTONE 25 MG PO TABS
25.0000 mg | ORAL_TABLET | Freq: Every day | ORAL | Status: DC
  Administered 2015-03-22 – 2015-03-24 (×2): 25 mg via ORAL
  Filled 2015-03-21 (×3): qty 1

## 2015-03-21 MED ORDER — POLYETHYLENE GLYCOL 3350 17 G PO PACK
17.0000 g | PACK | Freq: Every day | ORAL | Status: DC
  Administered 2015-03-21 – 2015-03-24 (×4): 17 g via ORAL
  Filled 2015-03-21 (×4): qty 1

## 2015-03-21 MED ORDER — IPRATROPIUM-ALBUTEROL 0.5-2.5 (3) MG/3ML IN SOLN
3.0000 mL | Freq: Four times a day (QID) | RESPIRATORY_TRACT | Status: DC
  Administered 2015-03-21 – 2015-03-22 (×2): 3 mL via RESPIRATORY_TRACT
  Filled 2015-03-21 (×2): qty 3

## 2015-03-21 MED ORDER — INSULIN GLARGINE 100 UNIT/ML ~~LOC~~ SOLN
90.0000 [IU] | Freq: Every day | SUBCUTANEOUS | Status: DC
  Administered 2015-03-21 – 2015-03-23 (×3): 90 [IU] via SUBCUTANEOUS
  Filled 2015-03-21 (×5): qty 0.9

## 2015-03-21 MED ORDER — ONDANSETRON HCL 4 MG/2ML IJ SOLN: 4.0000 mg | Freq: Four times a day (QID) | INTRAMUSCULAR | Status: DC | PRN

## 2015-03-21 MED ORDER — OXYCODONE-ACETAMINOPHEN 10-325 MG PO TABS: 1.0000 | ORAL_TABLET | Freq: Four times a day (QID) | ORAL | Status: DC | PRN

## 2015-03-21 MED ORDER — OXYCODONE HCL 5 MG PO TABS
5.0000 mg | ORAL_TABLET | Freq: Four times a day (QID) | ORAL | Status: DC | PRN
Start: 2015-03-21 — End: 2015-03-24
  Administered 2015-03-21 – 2015-03-24 (×7): 5 mg via ORAL
  Filled 2015-03-21 (×7): qty 1

## 2015-03-21 MED ORDER — IPRATROPIUM BROMIDE 0.02 % IN SOLN: 0.5000 mg | Freq: Once | RESPIRATORY_TRACT | Status: DC

## 2015-03-21 MED ORDER — ACETAMINOPHEN 650 MG RE SUPP: 650.0000 mg | Freq: Four times a day (QID) | RECTAL | Status: DC | PRN

## 2015-03-21 MED ORDER — OXYCODONE-ACETAMINOPHEN 5-325 MG PO TABS
1.0000 | ORAL_TABLET | Freq: Four times a day (QID) | ORAL | Status: DC | PRN
  Administered 2015-03-21 – 2015-03-24 (×7): 1 via ORAL
  Filled 2015-03-21 (×7): qty 1

## 2015-03-21 MED ORDER — LISINOPRIL 5 MG PO TABS
5.0000 mg | ORAL_TABLET | Freq: Every day | ORAL | Status: DC
  Administered 2015-03-22 – 2015-03-24 (×2): 5 mg via ORAL
  Filled 2015-03-21 (×3): qty 1

## 2015-03-21 MED ORDER — IPRATROPIUM-ALBUTEROL 0.5-2.5 (3) MG/3ML IN SOLN: 3.0000 mL | RESPIRATORY_TRACT | Status: DC | PRN

## 2015-03-21 MED ORDER — METHYLPREDNISOLONE SODIUM SUCC 125 MG IJ SOLR
125.0000 mg | Freq: Once | INTRAMUSCULAR | Status: AC
  Administered 2015-03-21: 125 mg via INTRAVENOUS
  Filled 2015-03-21: qty 2

## 2015-03-21 MED ORDER — GABAPENTIN 300 MG PO CAPS
300.0000 mg | ORAL_CAPSULE | Freq: Every evening | ORAL | Status: DC | PRN
  Filled 2015-03-21: qty 1

## 2015-03-21 MED ORDER — METHYLPREDNISOLONE SODIUM SUCC 125 MG IJ SOLR
60.0000 mg | Freq: Four times a day (QID) | INTRAMUSCULAR | Status: DC
  Administered 2015-03-21 – 2015-03-23 (×7): 60 mg via INTRAVENOUS
  Filled 2015-03-21 (×8): qty 0.96

## 2015-03-21 MED ORDER — ATORVASTATIN CALCIUM 10 MG PO TABS
10.0000 mg | ORAL_TABLET | Freq: Every day | ORAL | Status: DC
  Administered 2015-03-21 – 2015-03-23 (×3): 10 mg via ORAL
  Filled 2015-03-21 (×5): qty 1

## 2015-03-21 MED ORDER — DEXTROSE 5 % IV SOLN
1.0000 g | Freq: Once | INTRAVENOUS | Status: DC
  Filled 2015-03-21: qty 1

## 2015-03-21 MED ORDER — ACETAMINOPHEN 325 MG PO TABS: 650.0000 mg | ORAL_TABLET | Freq: Four times a day (QID) | ORAL | Status: DC | PRN

## 2015-03-21 MED ORDER — FUROSEMIDE 40 MG PO TABS
40.0000 mg | ORAL_TABLET | Freq: Two times a day (BID) | ORAL | Status: DC
  Administered 2015-03-22 – 2015-03-24 (×5): 40 mg via ORAL
  Filled 2015-03-21 (×5): qty 1

## 2015-03-21 MED ORDER — ALBUTEROL SULFATE (2.5 MG/3ML) 0.083% IN NEBU: 2.5000 mg | INHALATION_SOLUTION | RESPIRATORY_TRACT | Status: DC

## 2015-03-21 MED ORDER — DEXTROSE 5 % IV SOLN
500.0000 mg | Freq: Once | INTRAVENOUS | Status: AC
  Administered 2015-03-21: 500 mg via INTRAVENOUS
  Filled 2015-03-21: qty 500

## 2015-03-21 MED ORDER — INSULIN ASPART 100 UNIT/ML ~~LOC~~ SOLN
0.0000 [IU] | Freq: Three times a day (TID) | SUBCUTANEOUS | Status: DC
  Administered 2015-03-22: 20 [IU] via SUBCUTANEOUS
  Administered 2015-03-22: 11 [IU] via SUBCUTANEOUS
  Administered 2015-03-22: 15 [IU] via SUBCUTANEOUS
  Administered 2015-03-23: 7 [IU] via SUBCUTANEOUS
  Administered 2015-03-23: 15 [IU] via SUBCUTANEOUS
  Administered 2015-03-23 – 2015-03-24 (×3): 11 [IU] via SUBCUTANEOUS

## 2015-03-21 MED ORDER — DICLOFENAC SODIUM 1 % TD GEL: 2.0000 g | Freq: Every day | TRANSDERMAL | Status: DC | PRN

## 2015-03-21 MED ORDER — SODIUM CHLORIDE 0.9 % IV SOLN: 20.0000 mL | INTRAVENOUS | Status: DC

## 2015-03-21 MED ORDER — TRAMADOL HCL 50 MG PO TABS
100.0000 mg | ORAL_TABLET | Freq: Four times a day (QID) | ORAL | Status: DC | PRN
  Administered 2015-03-22 – 2015-03-24 (×5): 100 mg via ORAL
  Filled 2015-03-21 (×5): qty 2

## 2015-03-21 MED ORDER — DEXTROSE 5 % IV SOLN
1.0000 g | Freq: Once | INTRAVENOUS | Status: AC
  Administered 2015-03-21: 1 g via INTRAVENOUS
  Filled 2015-03-21: qty 10

## 2015-03-21 MED ORDER — ONDANSETRON HCL 4 MG PO TABS: 4.0000 mg | ORAL_TABLET | Freq: Four times a day (QID) | ORAL | Status: DC | PRN

## 2015-03-21 MED ORDER — HEPARIN SODIUM (PORCINE) 5000 UNIT/ML IJ SOLN
5000.0000 [IU] | Freq: Three times a day (TID) | INTRAMUSCULAR | Status: DC
  Administered 2015-03-21 – 2015-03-24 (×8): 5000 [IU] via SUBCUTANEOUS
  Filled 2015-03-21 (×11): qty 1

## 2015-03-21 NOTE — ED Notes (Signed)
Pt stated "The IV looked kinda funny so I tried to fix it and this here happened." Upon assessment, IV catheter had been completely removed from vein. This nurse cleaned up any bleeding that occurred.

## 2015-03-21 NOTE — ED Notes (Signed)
Patient transported to CT 

## 2015-03-21 NOTE — ED Notes (Signed)
Patient transported to X-ray 

## 2015-03-21 NOTE — Telephone Encounter (Signed)
Patient wants to see if he can get his medication so he won't have to go to the hospital from being stopped. He's aware that he needs an appointment but he wants he wants his medication regardless. [phone]

## 2015-03-21 NOTE — ED Notes (Signed)
EMS started having SOB for about 1 week.  HX of COPD.  EMS states he doesn't feel any worse today just decided to go to dr today.  EMS picked up from [facility] urgent care

## 2015-03-21 NOTE — H&P (Signed)
[facility] History and Physical  Dawaun Brancato ONG:[id number] DOB: August 30, 1955 DOA: 03/21/2015  Referring physician: Emergency Department PCP: Lucilla Edin, MD  Specialists:   Chief Complaint: SOB  HPI: Tyrone Cunningham is a 60 y.o. male with a hx of obesity, chronic hypoxia, diabetes type 2, hypertension, hyperlipidemia, OSA who initially presented to urgent care date of hospital admission with complaints of increasing shortness of breath 1 week as well as a cough productive of green mucus. At urgent care, patient was noted to be hypoxic with an O2 saturation of 80%. He was given O2, breathing treatments, and the patient was referred to the emergency department. In the ED, patient was again noted to be dyspneic and the patient was continued with neb treatments as well as IV steroids. Of note, chest x-ray was notable for nodular opacity measuring 2.2 x 1.0 cm over the right midlung, without frank edema or consolidation. Given patient's hypoxia, hospital service was consulted for consideration for admission.  Review of Systems: Review of Systems  Constitutional: Negative for fever, chills and weight loss.  HENT: Negative for hearing loss and tinnitus.   Eyes: Negative for pain and discharge.  Respiratory: Positive for cough, sputum production, shortness of breath and wheezing. Negative for hemoptysis.   Cardiovascular: Negative for palpitations and orthopnea.  Gastrointestinal: Negative for heartburn and nausea.  Genitourinary: Negative for urgency and frequency.  Musculoskeletal: Negative for back pain, falls and neck pain.  Neurological: Negative for tingling, tremors, seizures and loss of consciousness.  Psychiatric/Behavioral: Negative for hallucinations and memory loss.      Past Medical History  Diagnosis Date  . Bronchitis   . CHF (congestive heart failure) (HCC)   . DM type 2 with diabetic peripheral neuropathy (HCC)   . Hypertension   . Hypercholesteremia   . Hearing loss   .  Peripheral neuropathy (HCC)   . Sleep apnea     Severe sleep apnea requiring tracheostomy  . COPD (chronic obstructive pulmonary disease) [facility])    Past Surgical History  Procedure Laterality Date  . Tracheostomy    . Appendectomy    . Hand surgery    . Femur surgery    . Multiple extractions with alveoloplasty N/A 05/11/2012    Procedure: MULTIPLE EXTRACTION WITH ALVEOLOPLASTY;  Surgeon: [name], DDS;  Location: [facility] OR;  Service: Oral Surgery;  Laterality: N/A;  . Femur hardware removal  09/01/2003    Removal of retained hardware, two interlocking distal femoral  . Cholecystectomy N/A 01/05/2014    Procedure: LAPAROSCOPIC CHOLECYSTECTOMY;  Surgeon: Emelia Loron, MD;  Location: [facility] ORS;  Service: General;  Laterality: N/A;   Social History:  reports that he quit smoking about 15 years ago. His smoking use included Cigarettes. He has a 60 pack-year smoking history. He has never used smokeless tobacco. He reports that he does not drink alcohol or use illicit drugs.  where does patient live--home, ALF, SNF? and with whom if at home?  Can patient participate in ADLs?  No Known Allergies  Family History  Problem Relation Age of Onset  . Coronary artery disease Father      Prior to Admission medications   Medication Sig Start Date End Date Taking? Authorizing Provider  ADVAIR DISKUS 500-50 MCG/DOSE AEPB INHALE 1 PUFF BY MOUTH INTO THE LUNGS TWICE DAILY 03/05/15  Yes Collene Gobble, MD  albuterol (PROVENTIL HFA) 108 (90 BASE) MCG/ACT inhaler INHALE 2 PUFFS BY MOUTH EVERY 4 TO 6 HOURS AS NEEDED.  "OV NEEDED FOR REFILLS"  11/07/14  Yes Chelle Jeffery, PA-C  atorvastatin (LIPITOR) 10 MG tablet TAKE 1 TABLET(10 MG) BY MOUTH DAILY 11/20/14  Yes Collene Gobble, MD  furosemide (LASIX) 40 MG tablet TAKE 1 TABLET BY MOUTH TWICE DAILY   "OFFICE VISIT NEEDED FOR REFILLS" 12/11/14  Yes Chelle Jeffery, PA-C  gabapentin (NEURONTIN) 300 MG capsule TAKE 3 TO 4 CAPSULES BY MOUTH EVERY NIGHT AT BEDTIME AS  NEEDED FOR PAIN 11/15/14  Yes Collene Gobble, MD  GRALISE 600 MG TABS Take 3 tablets by mouth every morning.   Yes Historical Provider, MD  LANTUS 100 UNIT/ML injection INJECT 90 UNITS UNDER THE SKIN EVERY DAY AS DIRECTED 01/19/15  Yes Collene Gobble, MD  lisinopril (PRINIVIL,ZESTRIL) 5 MG tablet TAKE 1 TABLET BY MOUTH DAILY 07/21/14  Yes Collene Gobble, MD  NOVOLIN R 100 UNIT/ML injection INJECT 21 UNITS INTO THE SKIN FOUR TIMES DAILY BEFORE MEALS AND AS NEEDED PER SLIDING SCALE 01/19/15  Yes Collene Gobble, MD  oxyCODONE-acetaminophen (PERCOCET) 10-325 MG per tablet Take 1 tablet by mouth every 6 (six) hours as needed for pain (pain).    Yes Historical Provider, MD  polyethylene glycol powder (GLYCOLAX/MIRALAX) powder MIX ONE CAPFUL IN LIQUID OF CHOICE AND DRINK DAILY 01/28/15  Yes Chelle Jeffery, PA-C  spironolactone (ALDACTONE) 25 MG tablet TAKE 1 TABLET BY MOUTH EVERY DAY 10/24/14  Yes Chelle Jeffery, PA-C  tiotropium (SPIRIVA HANDIHALER) 18 MCG inhalation capsule INHALE TH CONTENTS OF 1 CAPSULE BY MOUTH VIA HANDIHALER ONCE DAILY AS DIRECTED  'OFFICE VISIT NEEDED' Patient taking differently: INHALE TH CONTENTS OF 1 CAPSULE BY MOUTH VIA HANDIHALER twice DAILY AS DIRECTED  'OFFICE VISIT NEEDED' 02/18/15  Yes Collene Gobble, MD  traMADol (ULTRAM) 50 MG tablet Take 100 mg by mouth every 6 (six) hours as needed for severe pain (pain).    Yes Historical Provider, MD  VOLTAREN 1 % GEL Apply 2 g topically daily as needed (hand pain).    Yes Historical Provider, MD  doxycycline (VIBRA-TABS) 100 MG tablet Take 1 tablet (100 mg total) by mouth 2 (two) times daily. Patient not taking: Reported on 03/21/2015 10/26/14   Collene Gobble, MD  Insulin Syringe-Needle U-100 (INSULIN SYRINGE .3CC/31GX5/16") 31G X 5/16" 0.3 ML MISC USE AS DIRECTED 03/29/14   Chelle Jeffery, PA-C  morphine (MSIR) 15 MG tablet Take 1 tablet (15 mg total) by mouth every 6 (six) hours as needed for severe pain. Patient not taking: Reported on  03/21/2015 01/07/14   Nishant Dhungel, MD  senna-docusate (SENOKOT-S) 8.6-50 MG per tablet Take 2 tablets by mouth at bedtime. Patient not taking: Reported on 03/21/2015 01/07/14   Eddie North, MD  UNABLE TO FIND Nebulizer machine DX: J44.9, I50.9 06/29/14   Ethelda Chick, MD  UNABLE TO FIND Hand held bedside urinal. Dx code: R26.9 11/21/14   Collene Gobble, MD   Physical Exam: Filed Vitals:   03/21/15 1245 03/21/15 1310 03/21/15 1537  BP: 107/79  131/87  Pulse: 111  105  Temp: 99.1 F (37.3 C)    TempSrc: Oral    Resp: 20  20  SpO2: 89% 95% 90%     General:  Awake, in nad  Eyes: PERRL B  ENT: membranes moist, dentition fair  Neck: trachea midline, neck supple  Cardiovascular: regular, s1, s2  Respiratory: wheezing throughout, decreased BS throughout   Abdomen: soft, obese, nondistended  Skin: normal skin turgor, no abnormal skin lesions seen  Musculoskeletal: perfused, no clubbing  Psychiatric: mood/affect normal//  no auditory/visual hallucinations  Neurologic: cn2-12 grossly intact, strength/sensation intact  Labs on Admission:  Basic Metabolic Panel:  Recent Labs Lab 03/21/15 1304  NA 133*  K 4.5  CL 95*  CO2 30  GLUCOSE 197*  BUN 13  CREATININE 0.81  CALCIUM 8.7*   Liver Function Tests:  Recent Labs Lab 03/21/15 1304  AST 20  ALT 20  ALKPHOS 110  BILITOT 1.2  PROT 7.3  ALBUMIN 2.9*   No results for input(s): LIPASE, AMYLASE in the last 168 hours. No results for input(s): AMMONIA in the last 168 hours. CBC:  Recent Labs Lab 03/21/15 1151 03/21/15 1304  WBC 10.7* 10.5  NEUTROABS  --  8.0*  HGB 17.2 17.3*  HCT 51.4 52.3*  MCV 87.1 91.4  PLT  --  200   Cardiac Enzymes:  Recent Labs Lab 03/21/15 1304  TROPONINI <0.03    BNP (last 3 results)  Recent Labs  03/21/15 1303  BNP 164.4*    ProBNP (last 3 results) No results for input(s): PROBNP in the last 8760 hours.  CBG: No results for input(s): GLUCAP in the last 168  hours.  Radiological Exams on Admission: Dg Chest 2 View  03/21/2015  CLINICAL DATA:  Shortness of breath and chest pain. Cough for 10 days EXAM: CHEST  2 VIEW COMPARISON:  August 30, 2012 FINDINGS: There is a 2.2 x 1.0 cm nodular opacity in the right mid lung region. There is no apparent edema or consolidation. Heart is upper normal in size with pulmonary vascularity within normal limits. Tracheostomy catheter tip is 5.1 cm above the carina. No pneumothorax. No adenopathy. No bone lesions. There is degenerative change in the thoracic spine. IMPRESSION: Nodular opacity right mid lung measuring 2.2 x 1.0 cm. Advise noncontrast enhanced chest CT to further assess. No frank edema or consolidation. Electronically Signed   By: Bretta Bang III M.D.   On: 03/21/2015 13:36     Assessment/Plan Principal Problem:   [name] with frequent exacerbations Active Problems:   CHF (congestive heart failure) (HCC)   Hypertension   Hypercholesteremia   Tracheostomy dependent (HCC)   DM (diabetes mellitus), type 2 with neurological complications (HCC)   Obesity   Chronic respiratory failure (HCC)   1. COPD exacerbation 1. Pt with decreased air movement 2. Continue on scheduled IV steroids 3. Cont on scheduled nebs as needed 4. Admit to med-tele 2. Possible Chronic Diastolic CHF 1. Last documented 2d echo from 2013 with normal LVEF 2. Stable 3. HTN 1. BP stable 2. Cont home meds 4. HLD 1. Cont statin per home regimen 5. Chronic hypoxia, trach dependent 1. Cont O2 as tolerated 2. Nebs and steroids per above 6. DM2 1. Cont home insulin regimen 2. Cont on SSI coverage 7. DVT prophylaxis 1. Heparin subq 8. Incidental Lung nodule 1. Pt could not tolerate CT in ED secondary to sob while laying flat 2. Consider trying re-imaging when respiratory function improves  Code Status: Full Family Communication: Pt in room Disposition Plan: Admit to med-tele   CHIU, Scheryl Marten [facility] Pager [phone]  If 7PM-7AM, please contact night-coverage [email or web address] Password [email or web address] 03/21/2015, 4:03 PM

## 2015-03-21 NOTE — ED Notes (Signed)
Pt had  of albuterol and EMS gave  albuterol

## 2015-03-21 NOTE — ED Provider Notes (Signed)
CSN: [id number]     Arrival date & time 03/21/15  1224 History   First MD Initiated Contact with Patient 03/21/15 1227     Chief Complaint  Patient presents with  . Shortness of Breath     HPI  Patient presents with concern of dyspnea, congestion, cough. Symptoms began about one week ago, since onset have been progressive. Patient went to his physician today, was found to be particularly dyspneic, sent here for evaluation. En route, the patient had 2 albuterol sessions. He notes that he has improved since he received these. He denies objective fever, but states that he always has a mild fever. No vomiting. Patient has a notable history of tracheostomy, CHF, COPD.   Past Medical History  Diagnosis Date  . Bronchitis   . CHF (congestive heart failure) (HCC)   . DM type 2 with diabetic peripheral neuropathy (HCC)   . Hypertension   . Hypercholesteremia   . Hearing loss   . Peripheral neuropathy (HCC)   . Sleep apnea     Severe sleep apnea requiring tracheostomy  . COPD (chronic obstructive pulmonary disease) [facility])    Past Surgical History  Procedure Laterality Date  . Tracheostomy    . Appendectomy    . Hand surgery    . Femur surgery    . Multiple extractions with alveoloplasty N/A 05/11/2012    Procedure: MULTIPLE EXTRACTION WITH ALVEOLOPLASTY;  Surgeon: [name], DDS;  Location: [facility] OR;  Service: Oral Surgery;  Laterality: N/A;  . Femur hardware removal  09/01/2003    Removal of retained hardware, two interlocking distal femoral  . Cholecystectomy N/A 01/05/2014    Procedure: LAPAROSCOPIC CHOLECYSTECTOMY;  Surgeon: Emelia Loron, MD;  Location: [facility] ORS;  Service: General;  Laterality: N/A;   Family History  Problem Relation Age of Onset  . Coronary artery disease Father    Social History  Substance Use Topics  . Smoking status: Former Smoker -- 2.00 packs/day for 30 years    Types: Cigarettes    Quit date: 07/02/1999  . Smokeless tobacco: Never Used  .  Alcohol Use: No    Review of Systems  Constitutional:       Per HPI, otherwise negative  HENT:       Per HPI, otherwise negative  Respiratory:       Per HPI, otherwise negative  Cardiovascular:       Per HPI, otherwise negative  Gastrointestinal: Negative for vomiting.  Endocrine:       Negative aside from HPI  Genitourinary:       Neg aside from HPI   Musculoskeletal:       Per HPI, otherwise negative  Skin: Negative.   Neurological: Negative for syncope.      Allergies  Review of patient's allergies indicates no known allergies.  Home Medications   Prior to Admission medications   Medication Sig Start Date End Date Taking? Authorizing Provider  ADVAIR DISKUS 500-50 MCG/DOSE AEPB INHALE 1 PUFF BY MOUTH INTO THE LUNGS TWICE DAILY 03/05/15  Yes Collene Gobble, MD  albuterol (PROVENTIL HFA) 108 (90 BASE) MCG/ACT inhaler INHALE 2 PUFFS BY MOUTH EVERY 4 TO 6 HOURS AS NEEDED.  "OV NEEDED FOR REFILLS" 11/07/14  Yes Chelle Jeffery, PA-C  atorvastatin (LIPITOR) 10 MG tablet TAKE 1 TABLET(10 MG) BY MOUTH DAILY 11/20/14  Yes Collene Gobble, MD  furosemide (LASIX) 40 MG tablet TAKE 1 TABLET BY MOUTH TWICE DAILY   "OFFICE VISIT NEEDED FOR REFILLS" 12/11/14  Yes  Chelle Jeffery, PA-C  gabapentin (NEURONTIN) 300 MG capsule TAKE 3 TO 4 CAPSULES BY MOUTH EVERY NIGHT AT BEDTIME AS NEEDED FOR PAIN 11/15/14  Yes Collene Gobble, MD  GRALISE 600 MG TABS Take 3 tablets by mouth every morning.   Yes Historical Provider, MD  LANTUS 100 UNIT/ML injection INJECT 90 UNITS UNDER THE SKIN EVERY DAY AS DIRECTED 01/19/15  Yes Collene Gobble, MD  lisinopril (PRINIVIL,ZESTRIL) 5 MG tablet TAKE 1 TABLET BY MOUTH DAILY 07/21/14  Yes Collene Gobble, MD  NOVOLIN R 100 UNIT/ML injection INJECT 21 UNITS INTO THE SKIN FOUR TIMES DAILY BEFORE MEALS AND AS NEEDED PER SLIDING SCALE 01/19/15  Yes Collene Gobble, MD  oxyCODONE-acetaminophen (PERCOCET) 10-325 MG per tablet Take 1 tablet by mouth every 6 (six) hours as needed for pain  (pain).    Yes Historical Provider, MD  polyethylene glycol powder (GLYCOLAX/MIRALAX) powder MIX ONE CAPFUL IN LIQUID OF CHOICE AND DRINK DAILY 01/28/15  Yes Chelle Jeffery, PA-C  spironolactone (ALDACTONE) 25 MG tablet TAKE 1 TABLET BY MOUTH EVERY DAY 10/24/14  Yes Chelle Jeffery, PA-C  tiotropium (SPIRIVA HANDIHALER) 18 MCG inhalation capsule INHALE TH CONTENTS OF 1 CAPSULE BY MOUTH VIA HANDIHALER ONCE DAILY AS DIRECTED  'OFFICE VISIT NEEDED' Patient taking differently: INHALE TH CONTENTS OF 1 CAPSULE BY MOUTH VIA HANDIHALER twice DAILY AS DIRECTED  'OFFICE VISIT NEEDED' 02/18/15  Yes Collene Gobble, MD  traMADol (ULTRAM) 50 MG tablet Take 100 mg by mouth every 6 (six) hours as needed for severe pain (pain).    Yes Historical Provider, MD  VOLTAREN 1 % GEL Apply 2 g topically daily as needed (hand pain).    Yes Historical Provider, MD  doxycycline (VIBRA-TABS) 100 MG tablet Take 1 tablet (100 mg total) by mouth 2 (two) times daily. Patient not taking: Reported on 03/21/2015 10/26/14   Collene Gobble, MD  Insulin Syringe-Needle U-100 (INSULIN SYRINGE .3CC/31GX5/16") 31G X 5/16" 0.3 ML MISC USE AS DIRECTED 03/29/14   Chelle Jeffery, PA-C  morphine (MSIR) 15 MG tablet Take 1 tablet (15 mg total) by mouth every 6 (six) hours as needed for severe pain. Patient not taking: Reported on 03/21/2015 01/07/14   Nishant Dhungel, MD  senna-docusate (SENOKOT-S) 8.6-50 MG per tablet Take 2 tablets by mouth at bedtime. Patient not taking: Reported on 03/21/2015 01/07/14   Eddie North, MD  UNABLE TO FIND Nebulizer machine DX: J44.9, I50.9 06/29/14   Ethelda Chick, MD  UNABLE TO FIND Hand held bedside urinal. Dx code: R26.9 11/21/14   Collene Gobble, MD   BP 107/79 mmHg  Pulse 111  Temp(Src) 99.1 F (37.3 C) (Oral)  Resp 20  SpO2 95% Physical Exam  Constitutional: He is oriented to person, place, and time. He appears well-developed. No distress.  Morbidly obese male with trach, speaking, awake, alert, oriented  appropriately  HENT:  Head: Normocephalic and atraumatic.  Eyes: Conjunctivae and EOM are normal.  Neck:    Cardiovascular: Regular rhythm.  Tachycardia present.   Pulmonary/Chest: No stridor. Tachypnea noted. He has decreased breath sounds. He has wheezes.  Abdominal: He exhibits no distension.  Musculoskeletal: He exhibits no edema.  Neurological: He is alert and oriented to person, place, and time.  Skin: Skin is warm and dry.  Psychiatric: He has a normal mood and affect.  Nursing note and vitals reviewed.   ED Course  Procedures (including critical care time) Labs Review Labs Reviewed  COMPREHENSIVE METABOLIC PANEL - Abnormal; Notable for the  following:    Sodium 133 (*)    Chloride 95 (*)    Glucose, Bld 197 (*)    Calcium 8.7 (*)    Albumin 2.9 (*)    All other components within normal limits  BRAIN NATRIURETIC PEPTIDE - Abnormal; Notable for the following:    B Natriuretic Peptide 164.4 (*)    All other components within normal limits  CBC WITH DIFFERENTIAL/PLATELET - Abnormal; Notable for the following:    Hemoglobin 17.3 (*)    HCT 52.3 (*)    Neutro Abs 8.0 (*)    All other components within normal limits  TROPONIN I    Imaging Review Dg Chest 2 View  03/21/2015  CLINICAL DATA:  Shortness of breath and chest pain. Cough for 10 days EXAM: CHEST  2 VIEW COMPARISON:  August 30, 2012 FINDINGS: There is a 2.2 x 1.0 cm nodular opacity in the right mid lung region. There is no apparent edema or consolidation. Heart is upper normal in size with pulmonary vascularity within normal limits. Tracheostomy catheter tip is 5.1 cm above the carina. No pneumothorax. No adenopathy. No bone lesions. There is degenerative change in the thoracic spine. IMPRESSION: Nodular opacity right mid lung measuring 2.2 x 1.0 cm. Advise noncontrast enhanced chest CT to further assess. No frank edema or consolidation. Electronically Signed   By: Bretta Bang III M.D.   On: 03/21/2015 13:36      After the initial x-ray discussed the findings with patient. Specifically we discussed the need to differentiate pneumonia versus mass. Patient was amenable to attempting CT scan.  I have personally reviewed and evaluated these images and lab results as part of my medical decision-making.    On repeat exam the patient is tachycardic, hypoxic, 87%, with trach collar. Lung sounds have improved. Patient was unable to tolerate CT scan. With concern for pneumonia, antibiotics will be started.  MDM  Obese male with tracheostomy presents with dyspnea, and on exam is tachypneic, tachycardic, hypoxic, with coarse breath sounds. After multiple nebulizer treatments, the patient improved. However, with concern for pneumonia, COPD exacerbation, as well as possible malignancy, patient continued to receive supplemental oxygen, antibiotics, steroids, and was admitted for further evaluation and management.  Gerhard Munch, MD 03/21/15 1537

## 2015-03-21 NOTE — Progress Notes (Signed)
By signing my name below, I, Raven Small, attest that this documentation has been prepared under the direction and in the presence of [name], MD.  Electronically Signed: Andrew Au, ED Scribe. 03/21/2015. 11:09 AM.  Chief Complaint:  Chief Complaint  Patient presents with  . Follow-up  . Medication Refill    miralax    HPI: Tyrone Cunningham is a 60 y.o. male who reports to [facility] today complaining of worsening SOB that began 1 week ago. Pt states he has not been feeling well for the past week. He reports associated productive cough with dark green mucous through tracheostomy tube, fatigue, gurgling for 1 week and improved mild CP 1 week ago.  Past Medical History  Diagnosis Date  . Bronchitis   . CHF (congestive heart failure) (HCC)   . DM type 2 with diabetic peripheral neuropathy (HCC)   . Hypertension   . Hypercholesteremia   . Hearing loss   . Peripheral neuropathy (HCC)   . Sleep apnea     Severe sleep apnea requiring tracheostomy  . COPD (chronic obstructive pulmonary disease) [facility])    Past Surgical History  Procedure Laterality Date  . Tracheostomy    . Appendectomy    . Hand surgery    . Femur surgery    . Multiple extractions with alveoloplasty N/A 05/11/2012    Procedure: MULTIPLE EXTRACTION WITH ALVEOLOPLASTY;  Surgeon: [name], DDS;  Location: [facility] OR;  Service: Oral Surgery;  Laterality: N/A;  . Femur hardware removal  09/01/2003    Removal of retained hardware, two interlocking distal femoral  . Cholecystectomy N/A 01/05/2014    Procedure: LAPAROSCOPIC CHOLECYSTECTOMY;  Surgeon: Emelia Loron, MD;  Location: [facility] ORS;  Service: General;  Laterality: N/A;   Social History   Social History  . Marital Status: Single    Spouse Name: N/A  . Number of Children: N/A  . Years of Education: N/A   Social History Main Topics  . Smoking status: Former Smoker -- 2.00 packs/day for 30 years    Types: Cigarettes    Quit date: 07/02/1999  . Smokeless tobacco:  Never Used  . Alcohol Use: No  . Drug Use: No  . Sexual Activity: No   Other Topics Concern  . None   Social History Narrative   Family History  Problem Relation Age of Onset  . Coronary artery disease Father    No Known Allergies Prior to Admission medications   Medication Sig Start Date End Date Taking? Authorizing Provider  ADVAIR DISKUS 500-50 MCG/DOSE AEPB INHALE 1 PUFF BY MOUTH INTO THE LUNGS TWICE DAILY 03/05/15  Yes Collene Gobble, MD  albuterol (PROVENTIL HFA) 108 (90 BASE) MCG/ACT inhaler INHALE 2 PUFFS BY MOUTH EVERY 4 TO 6 HOURS AS NEEDED.  "OV NEEDED FOR REFILLS" 11/07/14  Yes Chelle Jeffery, PA-C  atorvastatin (LIPITOR) 10 MG tablet TAKE 1 TABLET(10 MG) BY MOUTH DAILY 11/20/14  Yes Collene Gobble, MD  furosemide (LASIX) 40 MG tablet TAKE 1 TABLET BY MOUTH TWICE DAILY   "OFFICE VISIT NEEDED FOR REFILLS" 12/11/14  Yes Chelle Jeffery, PA-C  gabapentin (NEURONTIN) 300 MG capsule TAKE 3 TO 4 CAPSULES BY MOUTH EVERY NIGHT AT BEDTIME AS NEEDED FOR PAIN 11/15/14  Yes Collene Gobble, MD  GRALISE 600 MG TABS Take 3 tablets by mouth every morning.   Yes Historical Provider, MD  insulin aspart (NOVOLOG) 100 UNIT/ML injection Inject 21 Units into the skin 3 (three) times daily before meals.  Yes Historical Provider, MD  insulin glargine (LANTUS) 100 UNIT/ML injection Inject 90 Units into the skin daily.   Yes Historical Provider, MD  Insulin Syringe-Needle U-100 (INSULIN SYRINGE .3CC/31GX5/16") 31G X 5/16" 0.3 ML MISC USE AS DIRECTED 03/29/14  Yes Chelle Jeffery, PA-C  LANTUS 100 UNIT/ML injection INJECT 90 UNITS UNDER THE SKIN EVERY DAY AS DIRECTED 01/19/15  Yes Collene Gobble, MD  lisinopril (PRINIVIL,ZESTRIL) 5 MG tablet TAKE 1 TABLET BY MOUTH DAILY 07/21/14  Yes Collene Gobble, MD  NOVOLIN R 100 UNIT/ML injection INJECT 21 UNITS INTO THE SKIN FOUR TIMES DAILY BEFORE MEALS AND AS NEEDED PER SLIDING SCALE 01/19/15  Yes Collene Gobble, MD  oxyCODONE-acetaminophen (PERCOCET) 10-325 MG per tablet  Take 1 tablet by mouth every 6 (six) hours as needed for pain (pain).    Yes Historical Provider, MD  spironolactone (ALDACTONE) 25 MG tablet TAKE 1 TABLET BY MOUTH EVERY DAY 10/24/14  Yes Chelle Jeffery, PA-C  tiotropium (SPIRIVA HANDIHALER) 18 MCG inhalation capsule INHALE TH CONTENTS OF 1 CAPSULE BY MOUTH VIA HANDIHALER ONCE DAILY AS DIRECTED  'OFFICE VISIT NEEDED' 02/18/15  Yes Collene Gobble, MD  traMADol (ULTRAM) 50 MG tablet Take 100 mg by mouth every 6 (six) hours as needed for severe pain (pain).    Yes Historical Provider, MD  UNABLE TO FIND Nebulizer machine DX: J44.9, I50.9 06/29/14  Yes Ethelda Chick, MD  UNABLE TO FIND Hand held bedside urinal. Dx code: R26.9 11/21/14  Yes Collene Gobble, MD  VOLTAREN 1 % GEL Apply 2 g topically daily as needed (hand pain).    Yes Historical Provider, MD  doxycycline (VIBRA-TABS) 100 MG tablet Take 1 tablet (100 mg total) by mouth 2 (two) times daily. Patient not taking: Reported on 03/21/2015 10/26/14   Collene Gobble, MD  morphine (MSIR) 15 MG tablet Take 1 tablet (15 mg total) by mouth every 6 (six) hours as needed for severe pain. Patient not taking: Reported on 03/21/2015 01/07/14   Nishant Dhungel, MD  polyethylene glycol powder (GLYCOLAX/MIRALAX) powder MIX ONE CAPFUL IN LIQUID OF CHOICE AND DRINK DAILY Patient not taking: Reported on 03/21/2015 01/28/15   Chelle Jeffery, PA-C  senna-docusate (SENOKOT-S) 8.6-50 MG per tablet Take 2 tablets by mouth at bedtime. Patient not taking: Reported on 03/21/2015 01/07/14   Nishant Dhungel, MD     ROS: The patient denies fevers, chills, night sweats, unintentional weight loss, chest pain, palpitations, wheezing, dyspnea on exertion, nausea, vomiting, abdominal pain, dysuria, hematuria, melena, numbness, weakness, or tingling.   All other systems have been reviewed and were otherwise negative with the exception of those mentioned in the HPI and as above.    PHYSICAL EXAM: Filed Vitals:   03/21/15 1111  BP:  144/82  Pulse: 115  Temp: 99 F (37.2 C)  Resp: 18   He has spO2: 90 with pulse 112 on 2 L of O2.    General: Alert, Somewhat somnolent but not in distress.  HEENT:  Normocephalic, atraumatic, oropharynx patent.  Eye: Nonie Hoyer [facility] Cardiovascular:  Regular rate and rhythm, no rubs murmurs or gallops.  No Carotid bruits, radial pulse intact. No pedal edema.  Respiratory: Clear to auscultation bilaterally.  No wheezes or rales. No cyanosis, no use of accessory musculature. Coarse rhonchi on the right. Tracheostomy tube in place.   Abdominal: No organomegaly, abdomen is soft and non-tender, positive bowel sounds.  No masses. Musculoskeletal: Gait intact. Severe statis changes with 2+ edema.  Skin: No rashes.  Neurologic:  Facial musculature symmetric. Psychiatric: Patient acts appropriately throughout our interaction. Lymphatic: No cervical or submandibular lymphadenopathy    LABS: Results for orders placed or performed in visit on 03/21/15  POCT CBC  Result Value Ref Range   WBC 10.7 (A) 4.6 - 10.2 K/uL   Lymph, poc 1.4 0.6 - 3.4   POC LYMPH PERCENT 12.9 10 - 50 %L   MID (cbc) 0.7 0 - 0.9   POC MID % 6.9 0 - 12 %M   POC Granulocyte 8.6 (A) 2 - 6.9   Granulocyte percent 80.2 (A) 37 - 80 %G   RBC 5.90 4.69 - 6.13 M/uL   Hemoglobin 17.2 14.1 - 18.1 g/dL   HCT, POC 16.1 09.6 - 53.7 %   MCV 87.1 80 - 97 fL   MCH, POC 29.1 27 - 31.2 pg   MCHC 33.4 31.8 - 35.4 g/dL   RDW, POC 04.5 %   Platelet Count, POC 226 142 - 424 K/uL   MPV 7.5 0 - 99.8 fL  POCT glucose (manual entry)  Result Value Ref Range   POC Glucose 231 (A) 70 - 99 mg/dl  POCT glycosylated hemoglobin (Hb A1C)  Result Value Ref Range   Hemoglobin A1C 10.2     EKG/XRAY:    EKG:  Sinus tachy Q wave VI-VII. Possible previous septal infarct this is essentially unchanged from previous.  ASSESSMENT/PLAN: Patient with COPD( On examination at arrival his pulse ox was 80%. He was placed on O2 given an albuterol  nebulizer treatment. His diabetes is not been under any type of control. He typically cannot afford proper foods and his insulin administration has been very erratic in the past. There are rhonchi and wheezes noted on the right. He will be sent to the hospital for further evaluation. Some of his respiratory symptoms could be cardiac related.I personally performed the services described in this documentation, which was scribed in my presence. The recorded information has been reviewed and is accurate.Michaell Cowing sideeffects, risk and benefits, and alternatives of medications d/w patient. Patient is aware that all medications have potential sideeffects and we are unable to predict every sideeffect or drug-drug interaction that may occur.  [name] MD 03/21/2015 11:08 AM

## 2015-03-21 NOTE — ED Notes (Signed)
MD at bedside. 

## 2015-03-21 NOTE — ED Notes (Signed)
Bed: WA03 Expected date:  Expected time:  Means of arrival:  Comments: Ems-copd,trach,sob

## 2015-03-22 ENCOUNTER — Other Ambulatory Visit: Payer: Self-pay | Admitting: *Deleted

## 2015-03-22 DIAGNOSIS — J9610 Chronic respiratory failure, unspecified whether with hypoxia or hypercapnia: Secondary | ICD-10-CM

## 2015-03-22 LAB — COMPREHENSIVE METABOLIC PANEL
ALBUMIN: 2.8 g/dL — AB (ref 3.5–5.0)
ALT: 20 U/L (ref 17–63)
ANION GAP: 10 (ref 5–15)
AST: 16 U/L (ref 15–41)
Alkaline Phosphatase: 99 U/L (ref 38–126)
BUN: 27 mg/dL — ABNORMAL HIGH (ref 6–20)
CHLORIDE: 94 mmol/L — AB (ref 101–111)
CO2: 30 mmol/L (ref 22–32)
Calcium: 9.4 mg/dL (ref 8.9–10.3)
Creatinine, Ser: 1.08 mg/dL (ref 0.61–1.24)
GFR calc non Af Amer: >60 mL/min (ref >60)
Glucose, Bld: 305 mg/dL — ABNORMAL HIGH (ref 65–99)
POTASSIUM: 5.4 mmol/L — AB (ref 3.5–5.1)
SODIUM: 134 mmol/L — AB (ref 135–145)
TOTAL PROTEIN: 7.5 g/dL (ref 6.5–8.1)
Total Bilirubin: 0.5 mg/dL (ref 0.3–1.2)

## 2015-03-22 LAB — CBC
HCT: 52.1 % — ABNORMAL HIGH (ref 39.0–52.0)
Hemoglobin: 16.6 g/dL (ref 13.0–17.0)
MCH: 29.4 pg (ref 26.0–34.0)
MCHC: 31.9 g/dL (ref 30.0–36.0)
MCV: 92.4 fL (ref 78.0–100.0)
PLATELETS: 204 10*3/uL (ref 150–400)
RBC: 5.64 MIL/uL (ref 4.22–5.81)
RDW: 15.2 % (ref 11.5–15.5)
WBC: 8.2 10*3/uL (ref 4.0–10.5)

## 2015-03-22 LAB — GLUCOSE, CAPILLARY
GLUCOSE-CAPILLARY: 326 mg/dL — AB (ref 65–99)
Glucose-Capillary: 359 mg/dL — ABNORMAL HIGH (ref 65–99)
Glucose-Capillary: 285 mg/dL — ABNORMAL HIGH (ref 65–99)
Glucose-Capillary: 289 mg/dL — ABNORMAL HIGH (ref 65–99)

## 2015-03-22 LAB — BRAIN NATRIURETIC PEPTIDE: Brain Natriuretic Peptide: 136 pg/mL — ABNORMAL HIGH (ref 0.0–100.0)

## 2015-03-22 MED ORDER — IPRATROPIUM-ALBUTEROL 0.5-2.5 (3) MG/3ML IN SOLN
3.0000 mL | Freq: Four times a day (QID) | RESPIRATORY_TRACT | Status: DC
  Administered 2015-03-22 – 2015-03-24 (×9): 3 mL via RESPIRATORY_TRACT
  Filled 2015-03-22 (×9): qty 3

## 2015-03-22 NOTE — Care Management Note (Signed)
Case Management Note  Patient Details  Name: Tyrone Cunningham MRN: [id number] Date of Birth: 19-Apr-1955  Subjective/Objective:  60 y/o m admitted w/COPD.Hx:DM, chf,copd. From home. Has rw,power w/c,trach [facility] dme-he says he has no travel tank. [facility] dme rep Lecretia notified to f/u.  [facility] chosen for HHRN-med mgmnt,disease mgnt,copd protocal.PT cons-likely need HHPT.[facility] rep Clydie Braun aware of referral. Await HHRN/HHPT orders,f52f.  [facility] also following.              Action/Plan:d/c plan home w/HHC.   Expected Discharge Date:   (unknown)               Expected Discharge Plan:  Home/Self Care  In-House Referral:     Discharge planning Services  CM Consult  Post Acute Care Choice:    Choice offered to:     DME Arranged:    DME Agency:     HH Arranged:    HH Agency:     Status of Service:  In process, will continue to follow  Medicare Important Message Given:    Date Medicare IM Given:    Medicare IM give by:    Date Additional Medicare IM Given:    Additional Medicare Important Message give by:     If discussed at Long Length of Stay Meetings, dates discussed:    Additional Comments:  Lanier Clam, RN 03/22/2015, 11:46 AM

## 2015-03-22 NOTE — Clinical Documentation Improvement (Signed)
Internal Medicine  Can the diagnosis of Chronic Respiratory Failure be further specified? Please document response in next progress note; NOT in BPA drop down box. Thanks!   Document Acuity - Acute, Chronic, Acute on Chronic  Document Inclusion Of - Hypoxia, Hypercapnia, Combination of Both  Other  Clinically Undetermined  Document any associated diagnoses/conditions.  Supporting Information:  Janina Mayo dependent on room air when presented  Oxygen saturations ranging from 88 to 89% in room air  Respiratory rate 18 to 25; Heart rate > 100  Placed in 10L FiO2 with improvement in O2 saturations (93-99%)  Please exercise your independent, professional judgment when responding. A specific answer is not anticipated or expected.  Thank You,  Shellee Milo RN, BSN, CCDS Health Information Management [place] [phone]: Cell: [phone]

## 2015-03-22 NOTE — Progress Notes (Addendum)
Inpatient Diabetes Program Recommendations  AACE/ADA: New Consensus Statement on Inpatient Glycemic Control (2015)  Target Ranges:  Prepandial:   less than 140 mg/dL      Peak postprandial:   less than 180 mg/dL (1-2 hours)      Critically ill patients:  140 - 180 mg/dL    Results for TARIN, NAVAREZ (MRN [id number]) as of 03/22/2015 09:27  Ref. Range 03/21/2015 11:51  Hemoglobin A1C Unknown 10.2    Results for IOANE, BHOLA (MRN [id number]) as of 03/22/2015 09:27  Ref. Range 03/21/2015 16:29 03/21/2015 21:36 03/22/2015 07:56  Glucose-Capillary Latest Ref Range: 65-99 mg/dL 865 (H) 784 (H) 696 (H)    Admit with: SOB  History: DM, OSA, CHF, COPD  Home DM Meds: Lantus 90 units QHS       Novolin Regular insulin- 21 units QID with meals + SSI  Current Insulin Orders: Lantus 90 units QHS       Novolog Resistant SSI (0-20 units) TID AC     -Note patient currently receiving IV Solumedrol 60 mg Q6 hours.  -Expect glucose levels to be significantly elevated with steroid use.  -Glucose levels not well controlled at home as evidenced by A1c of 10.2%.  Patient sees Dr. Cle[facility]ts with [facility].  Per record review of Dr. Ellis Parents notes, patient with poor nutritional choices and sporadic insulin usage at home.  Saw that [facility] attempted to make contact with patient, however, [facility] could not make contact after 4 attempts.      MD- Please consider the following in-hospital insulin adjustments while patient getting IV steroids:   1. Change Novolog Resistant SSI to Q4 hour coverage (currently ordered as TID AC)  2. Start Novolog Meal Coverage- Novolog 6 units tidwc    --Will follow patient during hospitalization--  Ambrose Finland RN, MSN, CDE Diabetes Coordinator Inpatient Glycemic Control Team Team Pager: [phone] (8a-5p)

## 2015-03-22 NOTE — Progress Notes (Signed)
[facility] PROGRESS NOTE  Tyrone Cunningham BJY:[id number] DOB: May 29, 1955 DOA: 03/21/2015 PCP: Tyrone Edin, MD  HPI/Brief narrative 60 y.o. male with a hx of obesity, chronic hypoxia, diabetes type 2, hypertension, hyperlipidemia, OSA who initially presented to urgent care date of hospital admission with complaints of increasing shortness of breath 1 week as well as a cough productive of green mucus. At urgent care, patient was noted to be hypoxic with an O2 saturation of 80%. He was given O2, breathing treatments, and the patient was referred to the emergency department. In the ED, patient was again noted to be dyspneic and the patient was continued with neb treatments as well as IV steroids. Of note, chest x-ray was notable for nodular opacity measuring 2.2 x 1.0 cm over the right midlung, without frank edema or consolidation. Given patient's hypoxia, hospital service was consulted for consideration for admission.  Assessment/Plan: 1. Acute on chronic respiratory failure secondary to Cunningham exacerbation 1. Pt with decreased air movement on admit 2. Continue on scheduled IV steroids 3. Cont on scheduled nebs as needed 4. Improved overnight, still wheezing 2. Possible Chronic Diastolic CHF 1. Last documented 2d echo from 2013 with normal LVEF 2. Stable 3. Appears compensated 3. HTN 1. BP stable 2. Cont home meds 4. HLD 1. Cont statin per home regimen 5. Chronic hypoxia, trach dependent 1. Cont O2 as tolerated 2. Continue nebs and steroids per above 6. DM2 1. Cont home insulin regimen 2. Cont on SSI coverage 7. DVT prophylaxis 1. Heparin subq 8. Incidental Lung nodule 1. Pt could not tolerate CT in ED secondary to sob while laying flat 2. Consider trying re-imaging when respiratory function improves  Code Status: Full Family Communication: Pt in room Disposition Plan: anticipate d/c when off IV meds   Consultants:    Procedures:    Antibiotics: Anti-infectives    Start     Dose/Rate Route Frequency Ordered Stop   03/21/15 1600  azithromycin (ZITHROMAX) 500 mg in dextrose 5 % 250 mL IVPB     500 mg 250 mL/hr over 60 Minutes Intravenous  Once 03/21/15 1551 03/21/15 1809   03/21/15 1600  cefTRIAXone (ROCEPHIN) 1 g in dextrose 5 % 50 mL IVPB     1 g 100 mL/hr over 30 Minutes Intravenous  Once 03/21/15 1551 03/21/15 1825   03/21/15 1545  ceFEPIme (MAXIPIME) 1 g in dextrose 5 % 50 mL IVPB  Status:  Discontinued     1 g 100 mL/hr over 30 Minutes Intravenous  Once 03/21/15 1535 03/21/15 1551      HPI/Subjective: Feels better today, inquiring about going home  Objective: Filed Vitals:   03/22/15 0743 03/22/15 0942 03/22/15 1212 03/22/15 1331  BP:  128/73  117/48  Pulse: 78   85  Temp:    98 F (36.7 C)  TempSrc:    Oral  Resp: 20   20  Height:      Weight:      SpO2: 98%  94% 96%    Intake/Output Summary (Last 24 hours) at 03/22/15 1524 Last data filed at 03/22/15 0950  Gross per 24 hour  Intake    600 ml  Output    800 ml  Net   -200 ml   Filed Weights   03/22/15 0034  Weight: 154.1 kg (339 lb 11.7 oz)    Exam:   General:  Awake, in nad  Cardiovascular: regular, s1, s2  Respiratory: normal resp effort, end-expiratory wheezing  Abdomen: soft, obese, nondistended  Musculoskeletal: perfused, no clubbing   Data Reviewed: Basic Metabolic Panel:  Recent Labs Lab 03/21/15 1131 03/21/15 1304 03/22/15 0855  NA 133* 133* 134*  Cunningham 4.9 4.5 5.4*  CL 95* 95* 94*  CO2 GLUCOSE 205* 197* 305*  BUN 12 13 27*  CREATININE 0.76 0.81 1.08  CALCIUM 8.7 8.7* 9.4   Liver Function Tests:  Recent Labs Lab 03/21/15 1304 03/22/15 0855  AST 20 16  ALT 20 20  ALKPHOS 110 99  BILITOT 1.2 0.5  PROT 7.3 7.5  ALBUMIN 2.9* 2.8*   No results for input(s): LIPASE, AMYLASE in the last 168 hours. No results for input(s): AMMONIA in the last 168 hours. CBC:  Recent Labs Lab 03/21/15 1151 03/21/15 1304 03/22/15 0530  WBC  10.7* 10.5 8.2  NEUTROABS  --  8.0*  --   HGB 17.2 17.3* 16.6  HCT 51.4 52.3* 52.1*  MCV 87.1 91.4 92.4  PLT  --  200 204   Cardiac Enzymes:  Recent Labs Lab 03/21/15 1304  TROPONINI <0.03   BNP (last 3 results)  Recent Labs  03/21/15 1303  BNP 164.4*    ProBNP (last 3 results) No results for input(s): PROBNP in the last 8760 hours.  CBG:  Recent Labs Lab 03/21/15 1629 03/21/15 2136 03/22/15 0756 03/22/15 1135  GLUCAP 152* 303* 326* 359*    No results found for this or any previous visit (from the past 240 hour(s)).   Studies: Dg Chest 2 View  03/21/2015  CLINICAL DATA:  Shortness of breath and chest pain. Cough for 10 days EXAM: CHEST  2 VIEW COMPARISON:  August 30, 2012 FINDINGS: There is a 2.2 x 1.0 cm nodular opacity in the right mid lung region. There is no apparent edema or consolidation. Heart is upper normal in size with pulmonary vascularity within normal limits. Tracheostomy catheter tip is 5.1 cm above the carina. No pneumothorax. No adenopathy. No bone lesions. There is degenerative change in the thoracic spine. IMPRESSION: Nodular opacity right mid lung measuring 2.2 x 1.0 cm. Advise noncontrast enhanced chest CT to further assess. No frank edema or consolidation. Electronically Signed   By: Tyrone Cunningham M.D.   On: 03/21/2015 13:36    Scheduled Meds: . atorvastatin  10 mg Oral q1800  . furosemide  40 mg Oral BID  . heparin  5,000 Units Subcutaneous 3 times per day  . insulin aspart  0-20 Units Subcutaneous TID WC  . insulin glargine  90 Units Subcutaneous QHS  . ipratropium-albuterol  3 mL Nebulization QID  . lisinopril  5 mg Oral Daily  . methylPREDNISolone (SOLU-MEDROL) injection  60 mg Intravenous Q6H  . polyethylene glycol  17 g Oral Daily  . sodium chloride  3 mL Intravenous Q12H  . spironolactone  25 mg Oral Daily   Continuous Infusions: . sodium chloride      Principal Problem:   Tyrone Cunningham with frequent exacerbations Active  Problems:   CHF (congestive heart failure) (HCC)   Hypertension   Hypercholesteremia   Tracheostomy dependent (HCC)   DM (diabetes mellitus), type 2 with neurological complications (HCC)   Obesity   Chronic respiratory failure (HCC)   Cunningham with exacerbation (HCC)    Tyrone Cunningham  [facility] Pager [phone]. If 7PM-7AM, please contact night-coverage at [email or web address], password [facility] 03/22/2015, 3:24 PM  LOS: 1 day

## 2015-03-22 NOTE — Plan of Care (Signed)
Problem: Education: Goal: Knowledge of [facility] Education information/materials will improve Outcome: Progressing .  Problem: Health Behavior/Discharge Planning: Goal: Ability to manage health-related needs will improve Outcome: Progressing .  Problem: Physical Regulation: Goal: Ability to maintain clinical measurements within normal limits will improve Continue. Goal: Will remain free from infection Outcome: Progressing .  Problem: Skin Integrity: Goal: Risk for impaired skin integrity will decrease Continue

## 2015-03-22 NOTE — Consult Note (Addendum)
   [facility] CM Inpatient Consult   03/22/2015  Tyrone Cunningham [birth year] [id number]   Received referral from inpatient RNCM. Went to bedside speak with patient about [facility] services. He reports he is familiar with [facility] as he states "I did not need the program before, but now I do". He states he even thought about calling the after hours [facility] Care Management line before he came to ED. Written consent obtained for [facility] Care Management follow up. Will request for [facility] RNCM to be assigned for COPD and DM symptom and disease management and to assess for [facility] LCSW needs. He confirms his Primary Care MD is Dr. Cleta Alberts. States he rides the bus to MD appointments. Denies having issues with affording medications. He states he does not like his living situation however. Reports his friend, [name], lives with him but does not help. Confirmed his best contact number is his cell at [phone]. He also reports " I do not answer the phone on numbers I do not know". He states he will call back if a message is left however. Encouraged him to call  [facility] RNCM back once he is called for [facility] to be able to assist. Explained that [facility] will not interfere or replace services provided by home health. Spoke with inpatient RNCM who states patient will have [facility] for home health. [facility] Care Management packet information and contact numbers left at bedside. Appreciative of visit.   Raiford Noble, MSN-Ed, RN,BSN [facility] Liaison [phone]

## 2015-03-23 ENCOUNTER — Telehealth: Payer: Self-pay | Admitting: Physician Assistant

## 2015-03-23 DIAGNOSIS — J441 Chronic obstructive pulmonary disease with (acute) exacerbation: Principal | ICD-10-CM

## 2015-03-23 LAB — GLUCOSE, CAPILLARY
GLUCOSE-CAPILLARY: 318 mg/dL — AB (ref 65–99)
GLUCOSE-CAPILLARY: 283 mg/dL — AB (ref 65–99)
Glucose-Capillary: 244 mg/dL — ABNORMAL HIGH (ref 65–99)
Glucose-Capillary: 277 mg/dL — ABNORMAL HIGH (ref 65–99)

## 2015-03-23 LAB — BASIC METABOLIC PANEL
ANION GAP: 10 (ref 5–15)
BUN: 36 mg/dL — AB (ref 6–20)
CALCIUM: 9.2 mg/dL (ref 8.9–10.3)
CO2: 35 mmol/L — ABNORMAL HIGH (ref 22–32)
Chloride: 90 mmol/L — ABNORMAL LOW (ref 101–111)
Creatinine, Ser: 1.05 mg/dL (ref 0.61–1.24)
GFR calc Af Amer: >60 mL/min (ref >60)
Glucose, Bld: 363 mg/dL — ABNORMAL HIGH (ref 65–99)
POTASSIUM: 5.2 mmol/L — AB (ref 3.5–5.1)
SODIUM: 135 mmol/L (ref 135–145)

## 2015-03-23 MED ORDER — METHYLPREDNISOLONE SODIUM SUCC 125 MG IJ SOLR
60.0000 mg | Freq: Two times a day (BID) | INTRAMUSCULAR | Status: DC
  Administered 2015-03-23 – 2015-03-24 (×2): 60 mg via INTRAVENOUS
  Filled 2015-03-23 (×2): qty 0.96

## 2015-03-23 NOTE — Progress Notes (Addendum)
Inpatient Diabetes Program Recommendations  AACE/ADA: New Consensus Statement on Inpatient Glycemic Control (2015)  Target Ranges:  Prepandial:   less than 140 mg/dL      Peak postprandial:   less than 180 mg/dL (1-2 hours)      Critically ill patients:  140 - 180 mg/dL    Results for CHRISTIA, COAXUM (MRN [id number]) as of 03/23/2015 09:41  Ref. Range 03/22/2015 07:56 03/22/2015 11:35 03/22/2015 16:36 03/22/2015 21:11  Glucose-Capillary Latest Ref Range: 65-99 mg/dL 742 (H) 595 (H) 638 (H) 289 (H)   Results for BAYLER, GEHRIG (MRN [id number]) as of 03/23/2015 09:41  Ref. Range 03/23/2015 07:22  Glucose-Capillary Latest Ref Range: 65-99 mg/dL 188 (H)    Admit with: SOB  History: DM, OSA, CHF, COPD  Home DM Meds: Lantus 90 units QHS  Novolin Regular insulin- 21 units QID with meals + SSI  Current Insulin Orders: Lantus 90 units QHS   Novolog Resistant SSI (0-20 units) TID AC     -Note patient currently receiving IV Solumedrol 60 mg Q6 hours.  -Expect glucose levels to be significantly elevated with steroid use.  -Glucose levels not well controlled at home as evidenced by A1c of 10.2%. Patient sees Dr. Cleta Alberts with [facility]. Per record review of Dr. Ellis Parents notes, patient with poor nutritional choices and sporadic insulin usage at home. Saw that [facility] attempted to make contact with patient, however, [facility] could not make contact after 4 attempts.      MD- Please consider the following in-hospital insulin adjustments while patient getting IV steroids:   1. Start Novolog Meal Coverage- Novolog 10 units tidwc (per notes, patient takes 21 units Regular insulin with meals at home)  2. Increase Lantus to 100 units (would order as Lantus 50 units bid- start 1st dose today)     --Will follow patient during hospitalization--  Ambrose Finland RN, MSN, CDE Diabetes  Coordinator Inpatient Glycemic Control Team Team Pager: [phone] (8a-5p)

## 2015-03-23 NOTE — Evaluation (Addendum)
Physical Therapy Evaluation Patient Details Name: Tyrone Cunningham MRN: [id number] DOB: 1955-08-14 Today's Date: 03/23/2015   History of Present Illness  60 y.o. male with h/o DM, OSA, obesity, CHF admitted with COPD exacerbation, hypoxia, lung nodule found on imaging.  Clinical Impression  Pt reports he's non-ambulatory at baseline due to respiratory issues, he's independent with his power WC. Pt demonstrated independence with simulated WC transfer. SaO2 96% on 10L trach collar with activity. He appears to be at baseline with mobility, no further PT indicated, will sign off.     Follow Up Recommendations No PT follow up    Equipment Recommendations  None recommended by PT    Recommendations for Other Services       Precautions / Restrictions Precautions Precautions: Other (comment) Precaution Comments: on O2 at baseline, no falls in past year Restrictions Weight Bearing Restrictions: No      Mobility  Bed Mobility                  Transfers Overall transfer level: Independent Equipment used: None             General transfer comment: independent with sit to stand and with stand pivot transfer to recliner  Ambulation/Gait             General Gait Details: NT- pt non ambulatory at baseline  Stairs            Wheelchair Mobility    Modified Rankin (Stroke Patients Only)       Balance Overall balance assessment: No apparent balance deficits (not formally assessed)                                           Pertinent Vitals/Pain Pain Assessment: No/denies pain    Home Living Family/patient expects to be discharged to:: Private residence Living Arrangements: Non-relatives/Friends Available Help at Discharge: Available PRN/intermittently   Home Access: Level entry     Home Layout: One level Home Equipment: Environmental consultant - 2 wheels;Wheelchair - power;Walker - standard;Shower seat      Prior Function Level of Independence:  Independent with assistive device(s)         Comments: pt stated he doesn't walk at home due to "breathing problems", he uses his power chair for mobility, he's independent with WC transfers and with ADLs     Hand Dominance        Extremity/Trunk Assessment   Upper Extremity Assessment: Overall WFL for tasks assessed           Lower Extremity Assessment: Overall WFL for tasks assessed      Cervical / Trunk Assessment: Normal  Communication   Communication: No difficulties  Cognition Arousal/Alertness: Awake/alert Behavior During Therapy: WFL for tasks assessed/performed Overall Cognitive Status: Within Functional Limits for tasks assessed                      General Comments      Exercises        Assessment/Plan    PT Assessment Patent does not need any further PT services  PT Diagnosis     PT Problem List    PT Treatment Interventions     PT Goals (Current goals can be found in the Care Plan section) Acute Rehab PT Goals PT Goal Formulation: All assessment and education complete, DC therapy    Frequency  Barriers to discharge        Co-evaluation               End of Session Equipment Utilized During Treatment: Oxygen Activity Tolerance: Patient tolerated treatment well Patient left: in bed;with call bell/phone within reach Nurse Communication: Mobility status         Time: 4098-1191 PT Time Calculation (min) (ACUTE ONLY): 8 min   Charges:   PT Evaluation $PT Eval Low Complexity: 1 Procedure     PT G Codes:        Tamala Ser 03/23/2015, 11:40 AM [phone]

## 2015-03-23 NOTE — Progress Notes (Signed)
[facility] PROGRESS NOTE  Tyrone Cunningham ZOX:[id number] DOB: Nov 10, 1955 DOA: 03/21/2015 PCP: Tyrone Edin, MD  HPI/Brief narrative 60 y.o. male with a hx of obesity, chronic hypoxia, diabetes type 2, hypertension, hyperlipidemia, OSA who initially presented to urgent care date of hospital admission with complaints of increasing shortness of breath 1 week as well as a cough productive of green mucus. At urgent care, patient was noted to be hypoxic with an O2 saturation of 80%. He was given O2, breathing treatments, and the patient was referred to the emergency department. In the ED, patient was again noted to be dyspneic and the patient was continued with neb treatments as well as IV steroids. Of note, chest x-ray was notable for nodular opacity measuring 2.2 x 1.0 cm over the right midlung, without frank edema or consolidation. Given patient's hypoxia, hospital service was consulted for consideration for admission.  Assessment/Plan: 1. Acute on chronic respiratory failure secondary to COPD exacerbation 1. Pt with decreased air movement on admit 2. Continue on scheduled IV steroids 3. Cont on scheduled nebs as needed 4. Improved overnight, still wheezing 5. Baseline O2 noted to be 6L via trach QHS and PRN during the day 6. Wean steroids 2. Possible Chronic Diastolic CHF 1. Last documented 2d echo from 2013 with normal LVEF 2. Stable 3. Remains compensated 3. HTN 1. BP stable 2. Cont home meds 4. HLD 1. Cont statin per home regimen 5. Chronic hypoxia, trach dependent 1. Cont O2 as tolerated 2. Continue nebs and steroids per above 6. DM2 1. Cont home insulin regimen 2. Cont on SSI coverage 7. DVT prophylaxis 1. Heparin subq 8. Incidental Lung nodule 1. Pt could not tolerate CT in ED secondary to sob while laying flat 2. Consider trying re-imaging when respiratory function improves  Code Status: Full Family Communication: Pt in room Disposition Plan: anticipate d/c when off IV  meds   Consultants:    Procedures:    Antibiotics: Anti-infectives    Start     Dose/Rate Route Frequency Ordered Stop   03/21/15 1600  azithromycin (ZITHROMAX) 500 mg in dextrose 5 % 250 mL IVPB     500 mg 250 mL/hr over 60 Minutes Intravenous  Once 03/21/15 1551 03/21/15 1809   03/21/15 1600  cefTRIAXone (ROCEPHIN) 1 g in dextrose 5 % 50 mL IVPB     1 g 100 mL/hr over 30 Minutes Intravenous  Once 03/21/15 1551 03/21/15 1825   03/21/15 1545  ceFEPIme (MAXIPIME) 1 g in dextrose 5 % 50 mL IVPB  Status:  Discontinued     1 g 100 mL/hr over 30 Minutes Intravenous  Once 03/21/15 1535 03/21/15 1551      HPI/Subjective: Reports feeling somewhat better today  Objective: Filed Vitals:   03/23/15 0900 03/23/15 1220 03/23/15 1223 03/23/15 1400  BP: 121/64   150/74  Pulse: 86  88 83  Temp: 97.5 F (36.4 C)   97.7 F (36.5 C)  TempSrc: Oral   Oral  Resp: Height:      Weight:      SpO2: 96% 93% 93% 96%    Intake/Output Summary (Last 24 hours) at 03/23/15 1438 Last data filed at 03/23/15 0954  Gross per 24 hour  Intake      0 ml  Output    650 ml  Net   -650 ml   Filed Weights   03/22/15 0034 03/23/15 0700  Weight: 154.1 kg (339 lb 11.7 oz) 156.173 kg (344 lb 4.8  oz)    Exam:   General:  Awake, in nad, sitting in bed  Cardiovascular: regular, s1, s2  Respiratory: normal resp effort, decreased BS throughout  Abdomen: soft, obese, nondistended  Musculoskeletal: perfused, no clubbing   Data Reviewed: Basic Metabolic Panel:  Recent Labs Lab 03/21/15 1131 03/21/15 1304 03/22/15 0855 03/23/15 0458  NA 133* 133* 134* 135  Cunningham 4.9 4.5 5.4* 5.2*  CL 95* 95* 94* 90*  CO2 35*  GLUCOSE 205* 197* 305* 363*  BUN 12 13 27* 36*  CREATININE 0.76 0.81 1.08 1.05  CALCIUM 8.7 8.7* 9.4 9.2   Liver Function Tests:  Recent Labs Lab 03/21/15 1304 03/22/15 0855  AST 20 16  ALT 20 20  ALKPHOS 110 99  BILITOT 1.2 0.5  PROT 7.3 7.5  ALBUMIN  2.9* 2.8*   No results for input(s): LIPASE, AMYLASE in the last 168 hours. No results for input(s): AMMONIA in the last 168 hours. CBC:  Recent Labs Lab 03/21/15 1151 03/21/15 1304 03/22/15 0530  WBC 10.7* 10.5 8.2  NEUTROABS  --  8.0*  --   HGB 17.2 17.3* 16.6  HCT 51.4 52.3* 52.1*  MCV 87.1 91.4 92.4  PLT  --  200 204   Cardiac Enzymes:  Recent Labs Lab 03/21/15 1304  TROPONINI <0.03   BNP (last 3 results)  Recent Labs  03/21/15 1303  BNP 164.4*    ProBNP (last 3 results) No results for input(s): PROBNP in the last 8760 hours.  CBG:  Recent Labs Lab 03/22/15 1135 03/22/15 1636 03/22/15 2111 03/23/15 0722 03/23/15 1138  GLUCAP 359* 285* 289* 318* 244*    No results found for this or any previous visit (from the past 240 hour(s)).   Studies: No results found.  Scheduled Meds: . atorvastatin  10 mg Oral q1800  . furosemide  40 mg Oral BID  . heparin  5,000 Units Subcutaneous 3 times per day  . insulin aspart  0-20 Units Subcutaneous TID WC  . insulin glargine  90 Units Subcutaneous QHS  . ipratropium-albuterol  3 mL Nebulization QID  . lisinopril  5 mg Oral Daily  . methylPREDNISolone (SOLU-MEDROL) injection  60 mg Intravenous Q6H  . polyethylene glycol  17 g Oral Daily  . sodium chloride  3 mL Intravenous Q12H  . spironolactone  25 mg Oral Daily   Continuous Infusions: . sodium chloride      Principal Problem:   [name] with frequent exacerbations Active Problems:   CHF (congestive heart failure) (HCC)   Hypertension   Hypercholesteremia   Tracheostomy dependent (HCC)   DM (diabetes mellitus), type 2 with neurological complications (HCC)   Obesity   Chronic respiratory failure (HCC)   COPD with exacerbation (HCC)    Tyrone Cunningham  [facility] Pager [phone]. If 7PM-7AM, please contact night-coverage at [email or web address], password [facility] 03/23/2015, 2:38 PM  LOS: 2 days

## 2015-03-23 NOTE — Telephone Encounter (Signed)
Tyrone Cunningham - nurse case management at [facility] wants to know about his oxygen requirements - she is setting him up with portable O2 because it seems he needs oxygen all the time

## 2015-03-23 NOTE — Care Management Note (Signed)
Case Management Note  Patient Details  Name: Tyrone Cunningham MRN: [id number] Date of Birth: 01-28-1956  Subjective/Objective:  Spoke to Sarah-Dr. Ellis Parents office to confirm home 02-02 6l via trach qhs,prn during day-does not have a travel tank.  Patient will need travel tank for home-please check 02 sats, & document.MD please order 02-[facility] DME rep Lecretia-aware, & will bring trach travel tank to rm prior d/c.[facility] rep  Clydie Braun following for [facility] order, [facility] accepted patient.                 Action/Plan:d/c plan home w/HHC/home 02 via trach travel tank.   Expected Discharge Date:   (unknown)               Expected Discharge Plan:  Home/Self Care  In-House Referral:     Discharge planning Services  CM Consult  Post Acute Care Choice:  Durable Medical Equipment [facility] dme-home 02 6l  to trach qhs,prn during day-has no travel tank.) Choice offered to:     DME Arranged:    DME Agency:     HH Arranged:    HH Agency:  [facility]  Status of Service:  In process, will continue to follow  Medicare Important Message Given:    Date Medicare IM Given:    Medicare IM give by:    Date Additional Medicare IM Given:    Additional Medicare Important Message give by:     If discussed at Long Length of Stay Meetings, dates discussed:    Additional Comments:  Lanier Clam, RN 03/23/2015, 2:28 PM

## 2015-03-24 DIAGNOSIS — E1149 Type 2 diabetes mellitus with other diabetic neurological complication: Secondary | ICD-10-CM

## 2015-03-24 LAB — BASIC METABOLIC PANEL
Anion gap: 9 (ref 5–15)
BUN: 32 mg/dL — AB (ref 6–20)
CHLORIDE: 92 mmol/L — AB (ref 101–111)
CO2: 38 mmol/L — AB (ref 22–32)
Calcium: 9.7 mg/dL (ref 8.9–10.3)
Creatinine, Ser: 1.02 mg/dL (ref 0.61–1.24)
GFR calc Af Amer: >60 mL/min (ref >60)
GFR calc non Af Amer: >60 mL/min (ref >60)
GLUCOSE: 275 mg/dL — AB (ref 65–99)
POTASSIUM: 5.2 mmol/L — AB (ref 3.5–5.1)
Sodium: 139 mmol/L (ref 135–145)

## 2015-03-24 LAB — MAGNESIUM: Magnesium: 2.3 mg/dL (ref 1.7–2.4)

## 2015-03-24 LAB — GLUCOSE, CAPILLARY
GLUCOSE-CAPILLARY: 272 mg/dL — AB (ref 65–99)
GLUCOSE-CAPILLARY: 275 mg/dL — AB (ref 65–99)

## 2015-03-24 MED ORDER — PREDNISONE 5 MG PO TABS
5.0000 mg | ORAL_TABLET | Freq: Every day | ORAL | Status: DC
Start: 2015-03-24 — End: 2016-08-04

## 2015-03-24 MED ORDER — SODIUM POLYSTYRENE SULFONATE 15 GM/60ML PO SUSP
30.0000 g | Freq: Once | ORAL | Status: AC
  Administered 2015-03-24: 30 g via ORAL
  Filled 2015-03-24: qty 120

## 2015-03-24 NOTE — Progress Notes (Signed)
CM received call from RN stating pt needs loaner tank from [facility] to get home. CM called [facility] DME rep, Trey Paula who will deliver the tank to pt prior to discharge today.  CM also called [facility] rep, Tiffany to notify of discharge and HHRn/PT orders and face to face are in EPIC.  No other CM needs were communicated.  As soon as loaner tank is delivered to pt in room, pt can be discharged.

## 2015-03-24 NOTE — Progress Notes (Signed)
Received discharge orders for patient. This nurse contacted case management due to MD order for home health. Patient states he already has home health set up. This nurse advised case management of such. Case management advises they will make home health aware of the patient's discharge.   Patient is a trach patient and this nurse advised patient that he would need to be discharged with oxygen. This nurse asked if the person responsible for his transportation would be able to bring a portable O2 tank for discharge. Patient states "I don't need it." This nurse advised Tyrone Cunningham that he must be discharged with oxygen. Patient states "go ahead and bring it and I'll leave it right on the curb at the exit when I leave."   Patient states "I didn't get this way overnight and I know what I need." This nurse advised case management of conversation with patient, and case management advises that they will contact DME for a portable O2 tank for patient's discharge.  Advised patient that DME is being contacted for portable O2 prior to his discharge. Tyrone Cunningham stated "ok, whatever."

## 2015-03-24 NOTE — Discharge Summary (Signed)
Physician Discharge Summary  Tyrone Cunningham ZOX:[id number] DOB: Jun 07, 1955 DOA: 03/21/2015  PCP: Lucilla Edin, MD  Admit date: 03/21/2015 Discharge date: 03/24/2015  Time spent: 20 minutes  Recommendations for Outpatient Follow-up:  1. Follow up with PCP in 1-2 weeks  2. Recommend outpatient CT chest when lung function further improves and patient off steroids  Discharge Diagnoses:  Principal Problem:   [name] with frequent exacerbations Active Problems:   CHF (congestive heart failure) (HCC)   Hypertension   Hypercholesteremia   Tracheostomy dependent (HCC)   DM (diabetes mellitus), type 2 with neurological complications (HCC)   Obesity   Chronic respiratory failure (HCC)   COPD with exacerbation (HCC)   Discharge Condition: Improved  Diet recommendation: Diabetic  Filed Weights   03/22/15 0034 03/23/15 0700 03/24/15 0625  Weight: 154.1 kg (339 lb 11.7 oz) 156.173 kg (344 lb 4.8 oz) 155.538 kg (342 lb 14.4 oz)    History of present illness:  Please review dictated H and P from 1/18 for details. Briefly, 60 y.o. male with a hx of obesity, chronic hypoxia, diabetes type 2, hypertension, hyperlipidemia, OSA who initially presented to urgent care date of hospital admission with complaints of increasing shortness of breath 1 week as well as a cough productive of green mucus. At urgent care, patient was noted to be hypoxic with an O2 saturation of 80%. He was given O2, breathing treatments, and the patient was referred to the emergency department. In the ED, patient was again noted to be dyspneic and the patient was continued with neb treatments as well as IV steroids. Of note, chest x-ray was notable for nodular opacity measuring 2.2 x 1.0 cm over the right midlung, without frank edema or consolidation. Given patient's hypoxia, hospital service was consulted for consideration for admission.  Hospital Course:  1. Acute on chronic respiratory failure secondary to COPD  exacerbation 1. Pt presented with decreased air movement on admit 2. Patient was continued on scheduled IV steroids 3. Cont on scheduled nebs as needed 4. Baseline O2 noted to be 6L via trach QHS and PRN during the day 5. Continue steroid wean on discharge 6. Pt clinically improved 2. Possible Chronic Diastolic CHF 1. Last documented 2d echo from 2013 with normal LVEF 2. Stable 3. Remained compensated 3. HTN 1. BP stable 2. Cont home meds 4. HLD 1. Cont statin per home regimen 5. Chronic hypoxia, trach dependent 1. Cont O2 as tolerated 2. Continue nebs and steroids per above 6. DM2 1. Cont home insulin regimen 2. Cont on SSI coverage 7. DVT prophylaxis 1. Heparin subq 8. Incidental Lung nodule 1. Pt could not tolerate CT in ED secondary to sob while laying flat 2. Consider trying re-imaging when respiratory function improves as outpatient. Will notify pt's PCP to follow up  Discharge Exam: Filed Vitals:   03/24/15 0459 03/24/15 0625 03/24/15 0900 03/24/15 1244  BP:  140/79    Pulse: 82 82    Temp:  98.3 F (36.8 C)    TempSrc:  Oral    Resp: 18 20    Height:      Weight:  155.538 kg (342 lb 14.4 oz)    SpO2: 95% 93% 93% 93%    General: Awake, in nad Cardiovascular: regular, s1, s2 Respiratory: normal resp effort, decreased breath sounds throughout Discharge Instructions      Discharge Instructions    AMB Referral to [facility] Care Management    Complete by:  As directed   Please assign to  [facility] RNCM for disease and symptom management for COPD and DM. Also history of CHF. Please assess need for [facility] LCSW referral if needed. Written consent obtained. Currently at [facility]. Please call with questions. Raiford Noble, MSN-Ed, RN,BSN-THN [facility] Liaison-[phone]  Reason for consult:  Please assign to [facility] RNCM  Diagnoses of:  COPD/ Pneumonia  Expected date of contact:  1-3 days (reserved for hospital discharges)             Medication List    STOP taking these medications        doxycycline 100 MG tablet  Commonly known as:  VIBRA-TABS     morphine 15 MG tablet  Commonly known as:  MSIR     senna-docusate 8.6-50 MG tablet  Commonly known as:  Senokot-S      TAKE these medications        ADVAIR DISKUS 500-50 MCG/DOSE Aepb  Generic drug:  Fluticasone-Salmeterol  INHALE 1 PUFF BY MOUTH INTO THE LUNGS TWICE DAILY     albuterol 108 (90 Base) MCG/ACT inhaler  Commonly known as:  PROVENTIL HFA  INHALE 2 PUFFS BY MOUTH EVERY 4 TO 6 HOURS AS NEEDED.  "OV NEEDED FOR REFILLS"     atorvastatin 10 MG tablet  Commonly known as:  LIPITOR  TAKE 1 TABLET(10 MG) BY MOUTH DAILY     furosemide 40 MG tablet  Commonly known as:  LASIX  TAKE 1 TABLET BY MOUTH TWICE DAILY   "OFFICE VISIT NEEDED FOR REFILLS"     gabapentin 300 MG capsule  Commonly known as:  NEURONTIN  TAKE 3 TO 4 CAPSULES BY MOUTH EVERY NIGHT AT BEDTIME AS NEEDED FOR PAIN     GRALISE 600 MG Tabs  Generic drug:  Gabapentin (Once-Daily)  Take 3 tablets by mouth every morning.     INSULIN SYRINGE .3CC/31GX5/16" 31G X 5/16" 0.3 ML Misc  USE AS DIRECTED     LANTUS 100 UNIT/ML injection  Generic drug:  insulin glargine  INJECT 90 UNITS UNDER THE SKIN EVERY DAY AS DIRECTED     lisinopril 5 MG tablet  Commonly known as:  PRINIVIL,ZESTRIL  TAKE 1 TABLET BY MOUTH DAILY     NOVOLIN R 100 units/mL injection  Generic drug:  insulin regular  INJECT 21 UNITS INTO THE SKIN FOUR TIMES DAILY BEFORE MEALS AND AS NEEDED PER SLIDING SCALE     oxyCODONE-acetaminophen 10-325 MG tablet  Commonly known as:  PERCOCET  Take 1 tablet by mouth every 6 (six) hours as needed for pain (pain).     polyethylene glycol powder powder  Commonly known as:  GLYCOLAX/MIRALAX  MIX ONE CAPFUL IN LIQUID OF CHOICE AND DRINK DAILY AS DIRECTED     predniSONE 5 MG tablet  Commonly known as:  DELTASONE  Take 1 tablet (5 mg total) by mouth daily with breakfast.      spironolactone 25 MG tablet  Commonly known as:  ALDACTONE  TAKE 1 TABLET BY MOUTH EVERY DAY     tiotropium 18 MCG inhalation capsule  Commonly known as:  SPIRIVA HANDIHALER  INHALE TH CONTENTS OF 1 CAPSULE BY MOUTH VIA HANDIHALER ONCE DAILY AS DIRECTED  'OFFICE VISIT NEEDED'     traMADol 50 MG tablet  Commonly known as:  ULTRAM  Take 100 mg by mouth every 6 (six) hours as needed for severe pain (pain).     UNABLE TO FIND  Nebulizer machine DX: J44.9, I50.9     UNABLE TO FIND  Hand held bedside urinal. Dx code: R26.9     VOLTAREN 1 % Gel  Generic drug:  diclofenac sodium  Apply 2 g topically daily as needed (hand pain).       No Known Allergies Follow-up Information    Follow up with DAUB, [name], MD. Schedule an appointment as soon as possible for a visit in 1 week.   Specialty:  Family Medicine   Why:  Hospital follow up   Contact information:   [address] [place] [address] [phone]        The results of significant diagnostics from this hospitalization (including imaging, microbiology, ancillary and laboratory) are listed below for reference.    Significant Diagnostic Studies: Dg Chest 2 View  03/21/2015  CLINICAL DATA:  Shortness of breath and chest pain. Cough for 10 days EXAM: CHEST  2 VIEW COMPARISON:  August 30, 2012 FINDINGS: There is a 2.2 x 1.0 cm nodular opacity in the right mid lung region. There is no apparent edema or consolidation. Heart is upper normal in size with pulmonary vascularity within normal limits. Tracheostomy catheter tip is 5.1 cm above the carina. No pneumothorax. No adenopathy. No bone lesions. There is degenerative change in the thoracic spine. IMPRESSION: Nodular opacity right mid lung measuring 2.2 x 1.0 cm. Advise noncontrast enhanced chest CT to further assess. No frank edema or consolidation. Electronically Signed   By: Bretta Bang III M.D.   On: 03/21/2015 13:36    Microbiology: No results found for this or any  previous visit (from the past 240 hour(s)).   Labs: Basic Metabolic Panel:  Recent Labs Lab 03/21/15 1131 03/21/15 1304 03/22/15 0855 03/23/15 0458 03/24/15 0549  NA 133* 133* 134* 135 139  K 4.9 4.5 5.4* 5.2* 5.2*  CL 95* 95* 94* 90* 92*  CO2 35* 38*  GLUCOSE 205* 197* 305* 363* 275*  BUN 12 13 27* 36* 32*  CREATININE 0.76 0.81 1.08 1.05 1.02  CALCIUM 8.7 8.7* 9.4 9.2 9.7  MG  --   --   --   --  2.3   Liver Function Tests:  Recent Labs Lab 03/21/15 1304 03/22/15 0855  AST 20 16  ALT 20 20  ALKPHOS 110 99  BILITOT 1.2 0.5  PROT 7.3 7.5  ALBUMIN 2.9* 2.8*   No results for input(s): LIPASE, AMYLASE in the last 168 hours. No results for input(s): AMMONIA in the last 168 hours. CBC:  Recent Labs Lab 03/21/15 1151 03/21/15 1304 03/22/15 0530  WBC 10.7* 10.5 8.2  NEUTROABS  --  8.0*  --   HGB 17.2 17.3* 16.6  HCT 51.4 52.3* 52.1*  MCV 87.1 91.4 92.4  PLT  --  200 204   Cardiac Enzymes:  Recent Labs Lab 03/21/15 1304  TROPONINI <0.03   BNP: BNP (last 3 results)  Recent Labs  03/21/15 1303  BNP 164.4*    ProBNP (last 3 results) No results for input(s): PROBNP in the last 8760 hours.  CBG:  Recent Labs Lab 03/23/15 1138 03/23/15 1647 03/23/15 2028 03/24/15 0717 03/24/15 1148  GLUCAP 244* 277* 283* 272* 275*     Signed:  Kinsler Soeder K  [facility] 03/24/2015, 1:02 PM

## 2015-03-24 NOTE — Progress Notes (Signed)
Patient discharged with O2 loaner tank to get home.

## 2015-03-27 ENCOUNTER — Other Ambulatory Visit: Payer: Self-pay | Admitting: Emergency Medicine

## 2015-03-27 ENCOUNTER — Other Ambulatory Visit: Payer: Self-pay | Admitting: *Deleted

## 2015-03-27 DIAGNOSIS — R0602 Shortness of breath: Secondary | ICD-10-CM

## 2015-03-27 DIAGNOSIS — J441 Chronic obstructive pulmonary disease with (acute) exacerbation: Secondary | ICD-10-CM

## 2015-03-27 NOTE — Patient Outreach (Signed)
[facility]) Care Management  03/27/2015  Tyrone Cunningham 01-16-1956 [id number]   Assessment: Transition of care- week 1 (first attempt) Referral from hospital liaison (A.Hall) for disease symptom and management for COPD and diabetes.  Patient had recent hospitalization on 03/21/15- 03/24/15 for COPD exacerbation (COPD Gold).  Call placed to patient but phone number provided is not a working number and unable to leave a message.  Call placed to emergency contact/ brother Tyrone Cunningham) but unable to reach him. HIPAA compliant voice message left with name and contact information.   Plan: Will await return call from patient's brother. If unable to receive a call back, will schedule for next outreach call.  Ronny Korff A. Stephaie Dardis, BSN, RN-BC [facility] Community Care Management Coordinator Cell: [phone]

## 2015-03-28 ENCOUNTER — Telehealth: Payer: Self-pay

## 2015-03-28 DIAGNOSIS — G4733 Obstructive sleep apnea (adult) (pediatric): Secondary | ICD-10-CM | POA: Diagnosis not present

## 2015-03-28 DIAGNOSIS — E669 Obesity, unspecified: Secondary | ICD-10-CM | POA: Diagnosis not present

## 2015-03-28 DIAGNOSIS — J441 Chronic obstructive pulmonary disease with (acute) exacerbation: Secondary | ICD-10-CM | POA: Diagnosis not present

## 2015-03-28 DIAGNOSIS — I10 Essential (primary) hypertension: Secondary | ICD-10-CM | POA: Diagnosis not present

## 2015-03-28 DIAGNOSIS — E785 Hyperlipidemia, unspecified: Secondary | ICD-10-CM | POA: Diagnosis not present

## 2015-03-28 DIAGNOSIS — I5032 Chronic diastolic (congestive) heart failure: Secondary | ICD-10-CM | POA: Diagnosis not present

## 2015-03-28 DIAGNOSIS — Z794 Long term (current) use of insulin: Secondary | ICD-10-CM | POA: Diagnosis not present

## 2015-03-28 DIAGNOSIS — E1142 Type 2 diabetes mellitus with diabetic polyneuropathy: Secondary | ICD-10-CM | POA: Diagnosis not present

## 2015-03-28 NOTE — Telephone Encounter (Signed)
Home health nurse called requesting that dr Cleta Alberts call in a glucose meter for patient he states he has lot his and has not been checking his sugar, will need all supplies for a meter with test strips and lancets    [facility] [phone]

## 2015-03-29 ENCOUNTER — Other Ambulatory Visit: Payer: Self-pay | Admitting: Emergency Medicine

## 2015-03-29 ENCOUNTER — Other Ambulatory Visit: Payer: Self-pay | Admitting: *Deleted

## 2015-03-29 MED ORDER — BLOOD GLUCOSE MONITOR KIT: PACK | Status: DC

## 2015-03-29 NOTE — Telephone Encounter (Signed)
Meter and supplies sent to [employer].

## 2015-03-29 NOTE — Patient Outreach (Signed)
[facility]) Care Management  03/29/2015  Tyrone Cunningham 10-21-1955 [id number]   Assessment: Transition of care- week 1 (second attempt) Received a call back message from patient's brother Shayon Trompeter) and gave patient's new contact number (cell phone). Call placed to patient on new contact number provided by his brother but unable to reach him. HIPAA compliant voice message left with name and contact information.   Plan: Will await for return call. If unable to receive a call back, will schedule for next outreach call. Will attempt to contact primary provider's office to inquire of any contact informations that can be use to reach patient.   Ebert Forrester A. Arrington Yohe, BSN, RN-BC [facility] Community Care Management Coordinator Cell: [phone]

## 2015-03-30 ENCOUNTER — Ambulatory Visit (INDEPENDENT_AMBULATORY_CARE_PROVIDER_SITE_OTHER): Payer: Medicare Other | Admitting: Emergency Medicine

## 2015-03-30 ENCOUNTER — Telehealth: Payer: Self-pay

## 2015-03-30 ENCOUNTER — Other Ambulatory Visit: Payer: Self-pay | Admitting: Emergency Medicine

## 2015-03-30 ENCOUNTER — Ambulatory Visit (INDEPENDENT_AMBULATORY_CARE_PROVIDER_SITE_OTHER): Payer: Medicare Other

## 2015-03-30 ENCOUNTER — Other Ambulatory Visit: Payer: Self-pay | Admitting: *Deleted

## 2015-03-30 VITALS — BP 140/82 | HR 89 | Temp 99.4°F | Resp 18

## 2015-03-30 DIAGNOSIS — E1149 Type 2 diabetes mellitus with other diabetic neurological complication: Secondary | ICD-10-CM

## 2015-03-30 DIAGNOSIS — R938 Abnormal findings on diagnostic imaging of other specified body structures: Secondary | ICD-10-CM | POA: Diagnosis not present

## 2015-03-30 DIAGNOSIS — R9389 Abnormal findings on diagnostic imaging of other specified body structures: Secondary | ICD-10-CM

## 2015-03-30 DIAGNOSIS — R0602 Shortness of breath: Secondary | ICD-10-CM

## 2015-03-30 DIAGNOSIS — G4733 Obstructive sleep apnea (adult) (pediatric): Secondary | ICD-10-CM | POA: Diagnosis not present

## 2015-03-30 DIAGNOSIS — J441 Chronic obstructive pulmonary disease with (acute) exacerbation: Secondary | ICD-10-CM

## 2015-03-30 DIAGNOSIS — J449 Chronic obstructive pulmonary disease, unspecified: Secondary | ICD-10-CM | POA: Diagnosis not present

## 2015-03-30 LAB — GLUCOSE, POCT (MANUAL RESULT ENTRY): POC Glucose: 319 mg/dl — AB (ref 70–99)

## 2015-03-30 MED ORDER — ALBUTEROL SULFATE (2.5 MG/3ML) 0.083% IN NEBU: 2.5000 mg | INHALATION_SOLUTION | Freq: Four times a day (QID) | RESPIRATORY_TRACT | Status: DC | PRN

## 2015-03-30 NOTE — Telephone Encounter (Signed)
Please do not the case. I will mail him your phone number so he can call you.

## 2015-03-30 NOTE — Patient Instructions (Signed)
Because you received an x-ray today, you will receive an invoice from [facility]. Please contact [facility] at [phone] with questions or concerns regarding your invoice. Our billing staff will not be able to assist you with those questions. °

## 2015-03-30 NOTE — Progress Notes (Signed)
By signing my name below, I, Raven Small, attest that this documentation has been prepared under the direction and in the presence of Arlyss Queen, MD.  Electronically Signed: Thea Alken, ED Scribe. 03/30/2015. 8:15 AM.  Chief Complaint:  Chief Complaint  Patient presents with  . Hospitalization Follow-up    HPI: Tyrone Cunningham is a 60 y.o. male with hx of COPD who reports to [facility] today for a hospitalization f/u. Pt was seen here 03/21/2015 with SOB and productive cough. On examination he had 80% pulse ox as well as wheeze and rhonchi in his chest. He was sent to ED and admitted for 3 days. he was diagnosed with [name] with frequent exacerbation. He had an abnormal CXR with a vague density. He was supposed to have CT chest but could not tolerate due to SOB while laying flat. Since being discharge his breathing is much better. States he has been trying to stay active and getting things done. He is on his last dose of prednisone.  Pt is taking Lantus. He has not been taking his novolog or nebulizer regularly. He is also on Spiriva once a day and Advair twice a day   Pt is angry. States the women the is currently living with him as not been much of help. He reports that she is soon to move out. He denies depression.  Past Medical History  Diagnosis Date  . Bronchitis   . CHF (congestive heart failure) ([place])   . DM type 2 with diabetic peripheral neuropathy ([place])   . Hypertension   . Hypercholesteremia   . Hearing loss   . Peripheral neuropathy ([place])   . Sleep apnea     Severe sleep apnea requiring tracheostomy  . COPD (chronic obstructive pulmonary disease) [facility])    Past Surgical History  Procedure Laterality Date  . Tracheostomy    . Appendectomy    . Hand surgery    . Femur surgery    . Multiple extractions with alveoloplasty N/A 05/11/2012    Procedure: MULTIPLE EXTRACTION WITH ALVEOLOPLASTY;  Surgeon: Gae Bon, DDS;  Location: [place];  Service: Oral Surgery;  Laterality:  N/A;  . Femur hardware removal  09/01/2003    Removal of retained hardware, two interlocking distal femoral  . Cholecystectomy N/A 01/05/2014    Procedure: LAPAROSCOPIC CHOLECYSTECTOMY;  Surgeon: Rolm Bookbinder, MD;  Location[facility];  Service: General;  Laterality: N/A;   Social History   Social History  . Marital Status: Single    Spouse Name: N/A  . Number of Children: N/A  . Years of Education: N/A   Social History Main Topics  . Smoking status: Former Smoker -- 2.00 packs/day for 30 years    Types: Cigarettes    Quit date: 07/02/1999  . Smokeless tobacco: Never Used  . Alcohol Use: No  . Drug Use: No  . Sexual Activity: No   Other Topics Concern  . Not on file   Social History Narrative   Family History  Problem Relation Age of Onset  . Coronary artery disease Father    No Known Allergies Prior to Admission medications   Medication Sig Start Date End Date Taking? Authorizing Provider  ADVAIR DISKUS 500-50 MCG/DOSE AEPB INHALE 1 PUFF BY MOUTH INTO THE LUNGS TWICE DAILY 03/05/15   Darlyne Russian, MD  albuterol (PROVENTIL HFA) 108 (90 BASE) MCG/ACT inhaler INHALE 2 PUFFS BY MOUTH EVERY 4 TO 6 HOURS AS NEEDED.  "OV NEEDED FOR REFILLS" 11/07/14  Chelle Jeffery, PA-C  atorvastatin (LIPITOR) 10 MG tablet TAKE 1 TABLET(10 MG) BY MOUTH DAILY 11/20/14   Darlyne Russian, MD  blood glucose meter kit and supplies KIT Dispense based on patient and insurance preference. Use up to four times daily as directed.H47.65 03/29/15   Darlyne Russian, MD  furosemide (LASIX) 40 MG tablet TAKE 1 TABLET BY MOUTH TWICE DAILY   "OFFICE VISIT NEEDED FOR REFILLS" 12/11/14   Chelle Jeffery, PA-C  gabapentin (NEURONTIN) 300 MG capsule TAKE 3 TO 4 CAPSULES BY MOUTH EVERY NIGHT AT BEDTIME AS NEEDED FOR PAIN 11/15/14   Darlyne Russian, MD  GRALISE 600 MG TABS Take 3 tablets by mouth every morning.    Historical Provider, MD  Insulin Syringe-Needle U-100 (INSULIN SYRINGE .3CC/31GX5/16") 31G X 5/16" 0.3 ML MISC USE AS  DIRECTED 03/29/14   Chelle Jeffery, PA-C  LANTUS 100 UNIT/ML injection INJECT 90 UNITS UNDER THE SKIN EVERY DAY AS DIRECTED 01/19/15   Darlyne Russian, MD  lisinopril (PRINIVIL,ZESTRIL) 5 MG tablet TAKE 1 TABLET BY MOUTH DAILY 07/21/14   Darlyne Russian, MD  NOVOLIN R 100 UNIT/ML injection INJECT 21 UNITS INTO THE SKIN FOUR TIMES DAILY BEFORE MEALS AND AS NEEDED PER SLIDING SCALE 01/19/15   Darlyne Russian, MD  oxyCODONE-acetaminophen (PERCOCET) 10-325 MG per tablet Take 1 tablet by mouth every 6 (six) hours as needed for pain (pain).     Historical Provider, MD  polyethylene glycol powder (GLYCOLAX/MIRALAX) powder MIX ONE CAPFUL IN LIQUID OF CHOICE AND DRINK DAILY AS DIRECTED 03/21/15   Wardell Honour, MD  predniSONE (DELTASONE) 5 MG tablet Take 1 tablet (5 mg total) by mouth daily with breakfast. 03/24/15   Donne Hazel, MD  spironolactone (ALDACTONE) 25 MG tablet TAKE 1 TABLET BY MOUTH EVERY DAY 10/24/14   Chelle Jeffery, PA-C  tiotropium (SPIRIVA HANDIHALER) 18 MCG inhalation capsule INHALE TH CONTENTS OF 1 CAPSULE BY MOUTH VIA HANDIHALER ONCE DAILY AS DIRECTED  'OFFICE VISIT NEEDED' Patient taking differently: INHALE TH CONTENTS OF 1 CAPSULE BY MOUTH VIA HANDIHALER twice DAILY AS DIRECTED  'OFFICE VISIT NEEDED' 02/18/15   Darlyne Russian, MD  traMADol (ULTRAM) 50 MG tablet Take 100 mg by mouth every 6 (six) hours as needed for severe pain (pain).     Historical Provider, MD  UNABLE TO FIND Nebulizer machine DX: J44.9, I50.9 06/29/14   Wardell Honour, MD  UNABLE TO FIND Hand held bedside urinal. Dx code: R26.9 11/21/14   Darlyne Russian, MD  VOLTAREN 1 % GEL Apply 2 g topically daily as needed (hand pain).     Historical Provider, MD     ROS: The patient denies fevers, chills, night sweats, unintentional weight loss, chest pain, palpitations, wheezing, dyspnea on exertion, nausea, vomiting, abdominal pain, dysuria, hematuria, melena, numbness, weakness, or tingling.   All other systems have been reviewed and  were otherwise negative with the exception of those mentioned in the HPI and as above.    PHYSICAL EXAM: Filed Vitals:   03/30/15 0813  BP: 140/82  Pulse: 89  Temp: 99.4 F (37.4 C)  Resp: 18   There is no weight on file to calculate BMI.x   General: Alert, cooperative, no acute distress HEENT:  Normocephalic, atraumatic, oropharynx patent. Eye: [facility] Cardiovascular:  Regular rate and rhythm, no rubs murmurs or gallops.  No Carotid bruits, radial pulse intact. No pedal edema.  Respiratory: Clear to auscultation bilaterally.  No wheezes, rales, or rhonchi.  No cyanosis,  no use of accessory musculature. Tracheostomy in place. chest was clear. No wheeze.  Abdominal: No organomegaly, abdomen is soft and non-tender, positive bowel sounds.  No masses. Musculoskeletal: Gait intact. No edema, tenderness Skin: No rashes. Neurologic: Facial musculature symmetric. Psychiatric: Patient acts appropriately throughout our interaction. Lymphatic: No cervical or submandibular lymphadenopathy    LABS: Results for orders placed or performed in visit on 03/30/15  POCT glucose (manual entry)  Result Value Ref Range   POC Glucose 319 (A) 70 - 99 mg/dl     EKG/XRAY:   Primary read interpreted by [facility]. Dg Chest 2 View  03/21/2015  CLINICAL DATA:  Shortness of breath and chest pain. Cough for 10 days EXAM: CHEST  2 VIEW COMPARISON:  August 30, 2012 FINDINGS: There is a 2.2 x 1.0 cm nodular opacity in the right mid lung region. There is no apparent edema or consolidation. Heart is upper normal in size with pulmonary vascularity within normal limits. Tracheostomy catheter tip is 5.1 cm above the carina. No pneumothorax. No adenopathy. No bone lesions. There is degenerative change in the thoracic spine. IMPRESSION: Nodular opacity right mid lung measuring 2.2 x 1.0 cm. Advise noncontrast enhanced chest CT to further assess. No frank edema or consolidation. Electronically Signed   By:  Lowella Grip III M.D.   On: 03/21/2015 13:36     ASSESSMENT/PLAN: Chest x-ray looks improved. Glucose is not a goal . I doubt he is taking his insulin. He cannot find his albuterol nebulizer but we'll look again today. Overall he looks much better than when I admitted him to the hospital. He is due to stop his antibiotics. I have gone ahead and scheduled him for a CT of the chest rule out malignancy. His previous roommate is going to move out and moved to [place]. He was tearful when he talked about this but stated he was angry rather than sad. He denies suicidal thoughts. We will have to see if psychologically he improves once his roommate leaves. I personally performed the services described in this documentation, which was scribed in my presence. The recorded information has been reviewed and is accurate.   Probable let up and then she started with Unasyn. He usually get x-ray which is what is thought imaging imaging is worse services sugars are not at goal. I doubt he is taking his insulin prior to meals on sliding scale as he was instructed.   Gross sideeffects, risk and benefits, and alternatives of medications d/w patient. Patient is aware that all medications have potential sideeffects and we are unable to predict every sideeffect or drug-drug interaction that may occur.  Arlyss Queen MD 03/30/2015 8:14 AM

## 2015-03-30 NOTE — Telephone Encounter (Signed)
He was just here today,

## 2015-03-30 NOTE — Patient Outreach (Signed)
[facility]) Care Management  03/30/2015  Tyrone Cunningham [birth year] [id number]   Assessment: Transition of care follow-up call -week 1 (third attempt) Unable to receive any return call from patient to message left on his voicemail. Call placed to patient but unable to reach him. HIPAA compliant voice message left with name and contact number.   Call placed to patient's primary care provider's office to inquire of any contact informations to reach patient but the phone numbers that the office have are the same that care management coordinator has. Notified primary care provider's office (per [facility]) of unsuccessful attempts to contact patient and not returning back calls. She states she will notify patient's primary care provider about it. Noted that patient was seen at primary care provider's office for post hospital follow-up early this morning.  Plan: Will await for patient's return call. If unable to receive a call back, will schedule for next outreach call, if still unsuccessful, will send a Patient Outreach Letter.  Merrilee Ancona A. Kemo Spruce, BSN, RN-BC [facility] Community Care Management Coordinator Cell: [phone]

## 2015-03-30 NOTE — Telephone Encounter (Signed)
Karin Golden with [facility] called to let Dr Cleta Alberts know that she has been unsuccessful in reaching the patient thus far.  The listed home phone is disconnected, and she cannot reach him via cell phone.  She will be forced to close the case soon, if she does not hear from him.  Lorraine's CB#: [phone]

## 2015-04-02 NOTE — Telephone Encounter (Signed)
She will be sending out a letter to him

## 2015-04-03 DIAGNOSIS — E1142 Type 2 diabetes mellitus with diabetic polyneuropathy: Secondary | ICD-10-CM | POA: Diagnosis not present

## 2015-04-03 DIAGNOSIS — E785 Hyperlipidemia, unspecified: Secondary | ICD-10-CM | POA: Diagnosis not present

## 2015-04-03 DIAGNOSIS — J441 Chronic obstructive pulmonary disease with (acute) exacerbation: Secondary | ICD-10-CM | POA: Diagnosis not present

## 2015-04-03 DIAGNOSIS — I5032 Chronic diastolic (congestive) heart failure: Secondary | ICD-10-CM | POA: Diagnosis not present

## 2015-04-03 DIAGNOSIS — E669 Obesity, unspecified: Secondary | ICD-10-CM | POA: Diagnosis not present

## 2015-04-03 DIAGNOSIS — I10 Essential (primary) hypertension: Secondary | ICD-10-CM | POA: Diagnosis not present

## 2015-04-04 ENCOUNTER — Other Ambulatory Visit: Payer: Self-pay | Admitting: *Deleted

## 2015-04-04 ENCOUNTER — Encounter: Payer: Self-pay | Admitting: *Deleted

## 2015-04-04 DIAGNOSIS — J441 Chronic obstructive pulmonary disease with (acute) exacerbation: Secondary | ICD-10-CM | POA: Diagnosis not present

## 2015-04-04 DIAGNOSIS — E785 Hyperlipidemia, unspecified: Secondary | ICD-10-CM | POA: Diagnosis not present

## 2015-04-04 DIAGNOSIS — E1142 Type 2 diabetes mellitus with diabetic polyneuropathy: Secondary | ICD-10-CM | POA: Diagnosis not present

## 2015-04-04 DIAGNOSIS — I5032 Chronic diastolic (congestive) heart failure: Secondary | ICD-10-CM | POA: Diagnosis not present

## 2015-04-04 DIAGNOSIS — I10 Essential (primary) hypertension: Secondary | ICD-10-CM | POA: Diagnosis not present

## 2015-04-04 DIAGNOSIS — E669 Obesity, unspecified: Secondary | ICD-10-CM | POA: Diagnosis not present

## 2015-04-04 NOTE — Patient Outreach (Signed)
[facility]) Care Management  04/04/2015  Tyrone Cunningham 1955/05/19 [id number]   Assessment: Transition of care follow-up call (week 2) Unable to receive any response or call back from patient to messages left on his voicemail. Call placed to patient but unable to reach him. HIPAA compliant voice message left with name and contact information.   Plan: Will send a Patient Outreach Letter and wait for patient's response in 10 business days. If unable to receive any call back or response to letter sent, will close case.  Jeroline Wolbert A. Harvey Lingo, BSN, RN-BC [facility] Community Care Management Coordinator Cell: [phone]

## 2015-04-05 ENCOUNTER — Telehealth: Payer: Self-pay

## 2015-04-05 DIAGNOSIS — M79643 Pain in unspecified hand: Secondary | ICD-10-CM | POA: Diagnosis not present

## 2015-04-05 DIAGNOSIS — G894 Chronic pain syndrome: Secondary | ICD-10-CM | POA: Diagnosis not present

## 2015-04-05 DIAGNOSIS — Z79899 Other long term (current) drug therapy: Secondary | ICD-10-CM | POA: Diagnosis not present

## 2015-04-05 DIAGNOSIS — M545 Low back pain: Secondary | ICD-10-CM | POA: Diagnosis not present

## 2015-04-05 NOTE — Telephone Encounter (Signed)
[facility] Medicare Dept faxed form to be completed for DM supplies. Completed form and put in Dr Ellis Parents box for signature.

## 2015-04-06 DIAGNOSIS — J441 Chronic obstructive pulmonary disease with (acute) exacerbation: Secondary | ICD-10-CM | POA: Diagnosis not present

## 2015-04-06 DIAGNOSIS — E785 Hyperlipidemia, unspecified: Secondary | ICD-10-CM | POA: Diagnosis not present

## 2015-04-06 DIAGNOSIS — E1142 Type 2 diabetes mellitus with diabetic polyneuropathy: Secondary | ICD-10-CM | POA: Diagnosis not present

## 2015-04-06 DIAGNOSIS — I5032 Chronic diastolic (congestive) heart failure: Secondary | ICD-10-CM | POA: Diagnosis not present

## 2015-04-06 DIAGNOSIS — E669 Obesity, unspecified: Secondary | ICD-10-CM | POA: Diagnosis not present

## 2015-04-06 DIAGNOSIS — I10 Essential (primary) hypertension: Secondary | ICD-10-CM | POA: Diagnosis not present

## 2015-04-06 NOTE — Telephone Encounter (Signed)
Form completed and sent back

## 2015-04-09 DIAGNOSIS — J441 Chronic obstructive pulmonary disease with (acute) exacerbation: Secondary | ICD-10-CM | POA: Diagnosis not present

## 2015-04-09 DIAGNOSIS — I5032 Chronic diastolic (congestive) heart failure: Secondary | ICD-10-CM | POA: Diagnosis not present

## 2015-04-09 DIAGNOSIS — E785 Hyperlipidemia, unspecified: Secondary | ICD-10-CM | POA: Diagnosis not present

## 2015-04-09 DIAGNOSIS — E669 Obesity, unspecified: Secondary | ICD-10-CM | POA: Diagnosis not present

## 2015-04-09 DIAGNOSIS — I10 Essential (primary) hypertension: Secondary | ICD-10-CM | POA: Diagnosis not present

## 2015-04-09 DIAGNOSIS — E1142 Type 2 diabetes mellitus with diabetic polyneuropathy: Secondary | ICD-10-CM | POA: Diagnosis not present

## 2015-04-10 ENCOUNTER — Telehealth: Payer: Self-pay | Admitting: Physician Assistant

## 2015-04-10 DIAGNOSIS — E1142 Type 2 diabetes mellitus with diabetic polyneuropathy: Secondary | ICD-10-CM | POA: Diagnosis not present

## 2015-04-10 DIAGNOSIS — J9610 Chronic respiratory failure, unspecified whether with hypoxia or hypercapnia: Secondary | ICD-10-CM

## 2015-04-10 DIAGNOSIS — E669 Obesity, unspecified: Secondary | ICD-10-CM | POA: Diagnosis not present

## 2015-04-10 DIAGNOSIS — I5032 Chronic diastolic (congestive) heart failure: Secondary | ICD-10-CM | POA: Diagnosis not present

## 2015-04-10 DIAGNOSIS — Z930 Tracheostomy status: Secondary | ICD-10-CM

## 2015-04-10 DIAGNOSIS — J449 Chronic obstructive pulmonary disease, unspecified: Secondary | ICD-10-CM

## 2015-04-10 DIAGNOSIS — J441 Chronic obstructive pulmonary disease with (acute) exacerbation: Secondary | ICD-10-CM

## 2015-04-10 DIAGNOSIS — E785 Hyperlipidemia, unspecified: Secondary | ICD-10-CM | POA: Diagnosis not present

## 2015-04-10 DIAGNOSIS — I10 Essential (primary) hypertension: Secondary | ICD-10-CM | POA: Diagnosis not present

## 2015-04-10 NOTE — Telephone Encounter (Signed)
Placed order as instructed.

## 2015-04-10 NOTE — Telephone Encounter (Signed)
Rx faxed to Jarvis Newcomer at [facility].

## 2015-04-11 ENCOUNTER — Other Ambulatory Visit: Payer: Self-pay | Admitting: Emergency Medicine

## 2015-04-11 DIAGNOSIS — E669 Obesity, unspecified: Secondary | ICD-10-CM | POA: Diagnosis not present

## 2015-04-11 DIAGNOSIS — E785 Hyperlipidemia, unspecified: Secondary | ICD-10-CM | POA: Diagnosis not present

## 2015-04-11 DIAGNOSIS — I5032 Chronic diastolic (congestive) heart failure: Secondary | ICD-10-CM | POA: Diagnosis not present

## 2015-04-11 DIAGNOSIS — I10 Essential (primary) hypertension: Secondary | ICD-10-CM | POA: Diagnosis not present

## 2015-04-11 DIAGNOSIS — E1142 Type 2 diabetes mellitus with diabetic polyneuropathy: Secondary | ICD-10-CM | POA: Diagnosis not present

## 2015-04-11 DIAGNOSIS — J441 Chronic obstructive pulmonary disease with (acute) exacerbation: Secondary | ICD-10-CM | POA: Diagnosis not present

## 2015-04-12 ENCOUNTER — Encounter: Payer: Self-pay | Admitting: Cardiovascular Disease

## 2015-04-12 ENCOUNTER — Ambulatory Visit (INDEPENDENT_AMBULATORY_CARE_PROVIDER_SITE_OTHER): Payer: Medicare Other | Admitting: Cardiovascular Disease

## 2015-04-12 VITALS — BP 130/80 | HR 100 | Ht 68.0 in | Wt 342.8 lb

## 2015-04-12 DIAGNOSIS — I5032 Chronic diastolic (congestive) heart failure: Secondary | ICD-10-CM | POA: Diagnosis not present

## 2015-04-12 DIAGNOSIS — E669 Obesity, unspecified: Secondary | ICD-10-CM

## 2015-04-12 DIAGNOSIS — G4733 Obstructive sleep apnea (adult) (pediatric): Secondary | ICD-10-CM | POA: Diagnosis not present

## 2015-04-12 NOTE — Progress Notes (Signed)
Cardiology Office Note   Date:  04/12/2015   ID:  Tyrone Cunningham, DOB August 15, 1955, MRN [id number]  PCP:  Jenny Reichmann, MD  Cardiologist:   Thayer Headings, MD  ( previously saw [facility] in Jan. 2014)   No chief complaint on file.  Problem list 1. Severe COPD 2. History of obstructive sleep apnea-s/p  tracheostomy 3.  Essential hypertension 4. Hyperlipidemia 5.   History of Present Illness: Tyrone Cunningham is a 60 y.o. male who presents for follow up of of a hospitalization for pneumonia  He did not have any cardiac work up during the hospitalization  Was assumed to have some degree of CHF and was sent here for evaluation .   He has severe OSA - had a tracheostomy in 2001. Stopped smoking in April 2001.  Is able to walk some , mostly spends time in a chair or wheelchair.  No CP , Occasionally sharp chest twinges but no angina  Lives by himself.  Has a nurse, physical therapist, and social worker who come by to see him  Has been eating out mostly    Past Medical History  Diagnosis Date  . Bronchitis   . CHF (congestive heart failure) ([place])   . DM type 2 with diabetic peripheral neuropathy ([place])   . Hypertension   . Hypercholesteremia   . Hearing loss   . Peripheral neuropathy ([place])   . Sleep apnea     Severe sleep apnea requiring tracheostomy  . COPD (chronic obstructive pulmonary disease) [facility])     Past Surgical History  Procedure Laterality Date  . Tracheostomy    . Appendectomy    . Hand surgery    . Femur surgery    . Multiple extractions with alveoloplasty N/A 05/11/2012    Procedure: MULTIPLE EXTRACTION WITH ALVEOLOPLASTY;  Surgeon: Gae Bon, DDS;  Location: [place];  Service: Oral Surgery;  Laterality: N/A;  . Femur hardware removal  09/01/2003    Removal of retained hardware, two interlocking distal femoral  . Cholecystectomy N/A 01/05/2014    Procedure: LAPAROSCOPIC CHOLECYSTECTOMY;  Surgeon: Rolm Bookbinder, MD;  [facility];  Service: General;   Laterality: N/A;     Current Outpatient Prescriptions  Medication Sig Dispense Refill  . ACCU-CHEK FASTCLIX LANCETS MISC USE AS DIRECTED 9180 each 1  . ACCU-CHEK SMARTVIEW test strip USE TO CHECK BLOOD SUGAR UP TO FOUR TIMES DAILY 300 each 0  . ADVAIR DISKUS 500-50 MCG/DOSE AEPB INHALE 1 PUFF BY MOUTH INTO THE LUNGS TWICE DAILY 1 each 0  . albuterol (PROVENTIL HFA) 108 (90 BASE) MCG/ACT inhaler INHALE 2 PUFFS BY MOUTH EVERY 4 TO 6 HOURS AS NEEDED.  "OV NEEDED FOR REFILLS" 6.7 g 0  . albuterol (PROVENTIL) (2.5 MG/3ML) 0.083% nebulizer solution Take 3 mLs (2.5 mg total) by nebulization every 6 (six) hours as needed. Dx J44.1 150 mL 11  . atorvastatin (LIPITOR) 10 MG tablet TAKE 1 TABLET(10 MG) BY MOUTH DAILY 90 tablet 1  . blood glucose meter kit and supplies KIT Dispense based on patient and insurance preference. Use up to four times daily as directed.E11.42 1 each 0  . furosemide (LASIX) 40 MG tablet TAKE 1 TABLET BY MOUTH TWICE DAILY   "OFFICE VISIT NEEDED FOR REFILLS" 60 tablet 0  . gabapentin (NEURONTIN) 300 MG capsule TAKE 3 TO 4 CAPSULES BY MOUTH EVERY NIGHT AT BEDTIME AS NEEDED FOR PAIN 120 capsule 5  . GRALISE 600 MG TABS Take 3 tablets by mouth every morning.    Marland Kitchen  Insulin Syringe-Needle U-100 (INSULIN SYRINGE .3CC/31GX5/16") 31G X 5/16" 0.3 ML MISC USE AS DIRECTED 100 each 0  . LANTUS 100 UNIT/ML injection INJECT 90 UNITS UNDER THE SKIN EVERY DAY AS DIRECTED 30 mL 0  . lisinopril (PRINIVIL,ZESTRIL) 5 MG tablet TAKE 1 TABLET BY MOUTH DAILY 90 tablet 0  . NOVOLIN R 100 UNIT/ML injection ADMINISTER 21 UNITS UNDER THE SKIN FOUR TIMES DAILY BEFORE MEALS AS NEEDED PER SLIDING SCALE 30 mL 2  . oxyCODONE-acetaminophen (PERCOCET) 10-325 MG per tablet Take 1 tablet by mouth every 6 (six) hours as needed for pain (pain).     . polyethylene glycol powder (GLYCOLAX/MIRALAX) powder MIX ONE CAPFUL IN LIQUID OF CHOICE AND DRINK DAILY AS DIRECTED 527 g 0  . predniSONE (DELTASONE) 5 MG tablet Take 1  tablet (5 mg total) by mouth daily with breakfast.    . spironolactone (ALDACTONE) 25 MG tablet TAKE 1 TABLET BY MOUTH EVERY DAY 90 tablet 0  . tiotropium (SPIRIVA HANDIHALER) 18 MCG inhalation capsule INHALE TH CONTENTS OF 1 CAPSULE BY MOUTH VIA HANDIHALER ONCE DAILY AS DIRECTED  'OFFICE VISIT NEEDED' (Patient taking differently: INHALE TH CONTENTS OF 1 CAPSULE BY MOUTH VIA HANDIHALER twice DAILY AS DIRECTED  'OFFICE VISIT NEEDED') 90 capsule 0  . traMADol (ULTRAM) 50 MG tablet Take 100 mg by mouth every 6 (six) hours as needed for severe pain (pain).     Marland Kitchen UNABLE TO FIND Nebulizer machine DX: J44.9, I50.9 1 Device 0  . UNABLE TO FIND Hand held bedside urinal. Dx code: R26.9 1 Device 0  . VOLTAREN 1 % GEL Apply 2 g topically daily as needed (hand pain).      No current facility-administered medications for this visit.    Allergies:   Review of patient's allergies indicates no known allergies.    Social History:  The patient  reports that he quit smoking about 15 years ago. His smoking use included Cigarettes. He has a 60 pack-year smoking history. He has never used smokeless tobacco. He reports that he does not drink alcohol or use illicit drugs.   Family History:  The patient's family history includes Coronary artery disease in his father.    ROS:  Please see the history of present illness.    Review of Systems: Constitutional:  denies fever, chills, diaphoresis, appetite change and fatigue.  HEENT: denies photophobia, eye pain, redness, hearing loss, ear pain, congestion, sore throat, rhinorrhea, sneezing, neck pain, neck stiffness and tinnitus.  He has a tracheostomy in his neck   Respiratory: admits to SOB, DOE,    Cardiovascular: denies chest pain, palpitations and leg swelling.  Gastrointestinal: denies nausea, vomiting, abdominal pain, diarrhea, constipation, blood in stool.  Genitourinary: denies dysuria, urgency, frequency, hematuria, flank pain and difficulty urinating.    Musculoskeletal: denies  myalgias, back pain, joint swelling, arthralgias and gait problem.   Skin: denies pallor, rash and wound.  Neurological: denies dizziness, seizures, syncope, weakness, light-headedness, numbness and headaches.   Hematological: denies adenopathy, easy bruising, personal or family bleeding history.  Psychiatric/ Behavioral: denies suicidal ideation, mood changes, confusion, nervousness, sleep disturbance and agitation.       All other systems are reviewed and negative.    PHYSICAL EXAM: VS:  BP 130/80 mmHg  Pulse 100  Ht 5' 8"  (1.727 m)  Wt 342 lb 12.8 oz (155.493 kg)  BMI 52.13 kg/m2 , BMI Body mass index is 52.13 kg/(m^2). GEN: Well nourished, morbid obesity  HEENT: normal Neck: no JVD, carotid bruits, or masses  Cardiac: RRR; no murmurs, rubs, or gallops,no edema  Respiratory:  clear to auscultation bilaterally, normal work of breathing GI: soft, nontender, nondistended, + BS MS: no deformity or atrophy Skin: warm and dry, no rash Neuro:  Strength and sensation are intact Psych: normal   EKG:  EKG is not ordered today. The ekg ordered 03/21/15  demonstrates sinus tach , RBBB, no ST or T wave changes.    Recent Labs: 03/21/2015: B Natriuretic Peptide 164.4* 03/22/2015: ALT 20; Hemoglobin 16.6; Platelets 204 03/24/2015: BUN 32*; Creatinine, Ser 1.02; Magnesium 2.3; Potassium 5.2*; Sodium 139    Lipid Panel    Component Value Date/Time   CHOL 134 10/26/2014 1342   TRIG 113 10/26/2014 1342   HDL 33* 10/26/2014 1342   CHOLHDL 4.1 10/26/2014 1342   VLDL 23 10/26/2014 1342   LDLCALC 78 10/26/2014 1342      Wt Readings from Last 3 Encounters:  04/12/15 342 lb 12.8 oz (155.493 kg)  03/24/15 342 lb 14.4 oz (155.538 kg)  07/11/14 345 lb (156.491 kg)      Other studies Reviewed: Additional studies/ records that were reviewed today include: . Review of the above records demonstrates:    ASSESSMENT AND PLAN:  1.  Dyspnea:   He has multiple  reasons to have chronic dyspnea. He has morbid obesity with obesity hypoventilation. He has obstructive sleep apnea.  Had normal LV function by echo in 2012.   Has some diastolic dysfunction  BP is normal  He is on lasix and aldactone .  Will get an echo.    Assuming that his LV function is normal, we will see him on an as needed basis. He has known diastolic dysfunction   His main issue is his super morbid obesity .    He is completely limited by this .  He was seen by Dr. Johnsie Cancel about 3 years ago and his exam and symptoms are exactly the same.   He appears to have not made any progress since that time .   He does not have any angina.   Current medicines are reviewed at length with the patient today.  The patient does not have concerns regarding medicines.  The following changes have been made:  no change  Labs/ tests ordered today include:  No orders of the defined types were placed in this encounter.     Disposition:   FU with me as needed.      Christen Bedoya, Wonda Cheng, MD  04/12/2015 2:25 PM    [facility] [place], [place], [place]  [address] Phone: [phone]; Fax: [phone]   [facility]  [address] [place], [place]  [address] [phone]   Fax [phone]

## 2015-04-12 NOTE — Patient Instructions (Addendum)
Medication Instructions:  Your physician recommends that you continue on your current medications as directed. Please refer to the Current Medication list given to you today.   Labwork: None ordered  Testing/Procedures: Your physician has requested that you have an echocardiogram. Echocardiography is a painless test that uses sound waves to create images of your heart. It provides your doctor with information about the size and shape of your heart and how well your heart's chambers and valves are working. This procedure takes approximately one hour. There are no restrictions for this procedure.    Follow-Up: Your physician recommends that you schedule a follow-up appointment as needed with Dr. Elease Hashimoto.      If you need a refill on your cardiac medications before your next appointment, please call your pharmacy.

## 2015-04-13 ENCOUNTER — Telehealth: Payer: Self-pay

## 2015-04-13 DIAGNOSIS — E785 Hyperlipidemia, unspecified: Secondary | ICD-10-CM | POA: Diagnosis not present

## 2015-04-13 DIAGNOSIS — E1142 Type 2 diabetes mellitus with diabetic polyneuropathy: Secondary | ICD-10-CM | POA: Diagnosis not present

## 2015-04-13 DIAGNOSIS — J441 Chronic obstructive pulmonary disease with (acute) exacerbation: Secondary | ICD-10-CM | POA: Diagnosis not present

## 2015-04-13 DIAGNOSIS — I10 Essential (primary) hypertension: Secondary | ICD-10-CM | POA: Diagnosis not present

## 2015-04-13 DIAGNOSIS — I5032 Chronic diastolic (congestive) heart failure: Secondary | ICD-10-CM | POA: Diagnosis not present

## 2015-04-13 DIAGNOSIS — E669 Obesity, unspecified: Secondary | ICD-10-CM | POA: Diagnosis not present

## 2015-04-13 NOTE — Telephone Encounter (Signed)
Sue Lush from [facility] just needs to confirm the dosage of the novalog and prednizone  Please call [phone]

## 2015-04-14 ENCOUNTER — Other Ambulatory Visit: Payer: Self-pay | Admitting: Family Medicine

## 2015-04-16 DIAGNOSIS — I10 Essential (primary) hypertension: Secondary | ICD-10-CM | POA: Diagnosis not present

## 2015-04-16 DIAGNOSIS — I5032 Chronic diastolic (congestive) heart failure: Secondary | ICD-10-CM | POA: Diagnosis not present

## 2015-04-16 DIAGNOSIS — E669 Obesity, unspecified: Secondary | ICD-10-CM | POA: Diagnosis not present

## 2015-04-16 DIAGNOSIS — E785 Hyperlipidemia, unspecified: Secondary | ICD-10-CM | POA: Diagnosis not present

## 2015-04-16 DIAGNOSIS — J441 Chronic obstructive pulmonary disease with (acute) exacerbation: Secondary | ICD-10-CM | POA: Diagnosis not present

## 2015-04-16 DIAGNOSIS — E1142 Type 2 diabetes mellitus with diabetic polyneuropathy: Secondary | ICD-10-CM | POA: Diagnosis not present

## 2015-04-16 NOTE — Telephone Encounter (Signed)
Will forward to Dr. Barbaraann Faster.

## 2015-04-16 NOTE — Telephone Encounter (Signed)
Spoke with Sue Lush.  Upon review of chart, recommend the following:  1.  Administer Novolin R 21 units qac only; continue Lantus 90 units daily.  2.  Does not need to continue Prednisone per visit 03/30/15.

## 2015-04-17 DIAGNOSIS — I5032 Chronic diastolic (congestive) heart failure: Secondary | ICD-10-CM | POA: Diagnosis not present

## 2015-04-17 DIAGNOSIS — E785 Hyperlipidemia, unspecified: Secondary | ICD-10-CM | POA: Diagnosis not present

## 2015-04-17 DIAGNOSIS — E669 Obesity, unspecified: Secondary | ICD-10-CM | POA: Diagnosis not present

## 2015-04-17 DIAGNOSIS — E1142 Type 2 diabetes mellitus with diabetic polyneuropathy: Secondary | ICD-10-CM | POA: Diagnosis not present

## 2015-04-17 DIAGNOSIS — I10 Essential (primary) hypertension: Secondary | ICD-10-CM | POA: Diagnosis not present

## 2015-04-17 DIAGNOSIS — J441 Chronic obstructive pulmonary disease with (acute) exacerbation: Secondary | ICD-10-CM | POA: Diagnosis not present

## 2015-04-18 ENCOUNTER — Other Ambulatory Visit: Payer: Self-pay | Admitting: Family Medicine

## 2015-04-18 ENCOUNTER — Encounter: Payer: Self-pay | Admitting: Physician Assistant

## 2015-04-18 DIAGNOSIS — G894 Chronic pain syndrome: Secondary | ICD-10-CM | POA: Insufficient documentation

## 2015-04-18 DIAGNOSIS — M792 Neuralgia and neuritis, unspecified: Secondary | ICD-10-CM | POA: Insufficient documentation

## 2015-04-19 ENCOUNTER — Encounter: Payer: Self-pay | Admitting: *Deleted

## 2015-04-19 ENCOUNTER — Other Ambulatory Visit: Payer: Self-pay | Admitting: *Deleted

## 2015-04-19 DIAGNOSIS — E785 Hyperlipidemia, unspecified: Secondary | ICD-10-CM | POA: Diagnosis not present

## 2015-04-19 DIAGNOSIS — E669 Obesity, unspecified: Secondary | ICD-10-CM | POA: Diagnosis not present

## 2015-04-19 DIAGNOSIS — I10 Essential (primary) hypertension: Secondary | ICD-10-CM | POA: Diagnosis not present

## 2015-04-19 DIAGNOSIS — E1142 Type 2 diabetes mellitus with diabetic polyneuropathy: Secondary | ICD-10-CM | POA: Diagnosis not present

## 2015-04-19 DIAGNOSIS — I5032 Chronic diastolic (congestive) heart failure: Secondary | ICD-10-CM | POA: Diagnosis not present

## 2015-04-19 DIAGNOSIS — J441 Chronic obstructive pulmonary disease with (acute) exacerbation: Secondary | ICD-10-CM | POA: Diagnosis not present

## 2015-04-19 NOTE — Telephone Encounter (Signed)
Changed to TID dosing - placed form in box by TL

## 2015-04-19 NOTE — Telephone Encounter (Signed)
Got the form back stating that Medicare does not accept a range of testing times per day (instead of "up to 4 times" it must say "4" or "3" times a day). See notes on form and  highlighted areas, please. I am putting it back in Dr Ellis Parents box.

## 2015-04-19 NOTE — Patient Outreach (Signed)
[facility]) Care Management  04/19/2015  Tyrone Cunningham 1955-06-01 [id number]   Assessment: Case closure Unable to receive any call back from patient to messages left on his voice mailbox. Unable to receive any response to Patient Outreach Letter sent on 04/04/15. Primary care provider's office (per [facility]) was notified of unsuccessful attempts to contact patient on phone numbers provided.  Plan: Will close case due to difficulty to establish contact with patient. Will notify primary care provider of case closure.  Marcellino Fidalgo A. Eunique Balik, BSN, RN-BC [facility] Care Management Coordinator Cell: [phone]

## 2015-04-20 DIAGNOSIS — E785 Hyperlipidemia, unspecified: Secondary | ICD-10-CM | POA: Diagnosis not present

## 2015-04-20 DIAGNOSIS — I10 Essential (primary) hypertension: Secondary | ICD-10-CM | POA: Diagnosis not present

## 2015-04-20 DIAGNOSIS — J441 Chronic obstructive pulmonary disease with (acute) exacerbation: Secondary | ICD-10-CM | POA: Diagnosis not present

## 2015-04-20 DIAGNOSIS — E1142 Type 2 diabetes mellitus with diabetic polyneuropathy: Secondary | ICD-10-CM | POA: Diagnosis not present

## 2015-04-20 DIAGNOSIS — E669 Obesity, unspecified: Secondary | ICD-10-CM | POA: Diagnosis not present

## 2015-04-20 DIAGNOSIS — I5032 Chronic diastolic (congestive) heart failure: Secondary | ICD-10-CM | POA: Diagnosis not present

## 2015-04-20 NOTE — Telephone Encounter (Signed)
Dr Cleta Alberts, pt's last lipid test was in Aug, and was normal except for low HDL. He is seen regularly for check ups but haven't discussed chol since Aug. OK to give RFs for the rest of the year until Aug?

## 2015-04-23 DIAGNOSIS — E1142 Type 2 diabetes mellitus with diabetic polyneuropathy: Secondary | ICD-10-CM | POA: Diagnosis not present

## 2015-04-23 DIAGNOSIS — E669 Obesity, unspecified: Secondary | ICD-10-CM | POA: Diagnosis not present

## 2015-04-23 DIAGNOSIS — I10 Essential (primary) hypertension: Secondary | ICD-10-CM | POA: Diagnosis not present

## 2015-04-23 DIAGNOSIS — J441 Chronic obstructive pulmonary disease with (acute) exacerbation: Secondary | ICD-10-CM | POA: Diagnosis not present

## 2015-04-23 DIAGNOSIS — E785 Hyperlipidemia, unspecified: Secondary | ICD-10-CM | POA: Diagnosis not present

## 2015-04-23 DIAGNOSIS — I5032 Chronic diastolic (congestive) heart failure: Secondary | ICD-10-CM | POA: Diagnosis not present

## 2015-04-24 ENCOUNTER — Telehealth: Payer: Self-pay

## 2015-04-24 DIAGNOSIS — J441 Chronic obstructive pulmonary disease with (acute) exacerbation: Secondary | ICD-10-CM | POA: Diagnosis not present

## 2015-04-24 DIAGNOSIS — I5032 Chronic diastolic (congestive) heart failure: Secondary | ICD-10-CM | POA: Diagnosis not present

## 2015-04-24 DIAGNOSIS — E669 Obesity, unspecified: Secondary | ICD-10-CM | POA: Diagnosis not present

## 2015-04-24 DIAGNOSIS — E1142 Type 2 diabetes mellitus with diabetic polyneuropathy: Secondary | ICD-10-CM | POA: Diagnosis not present

## 2015-04-24 DIAGNOSIS — I10 Essential (primary) hypertension: Secondary | ICD-10-CM | POA: Diagnosis not present

## 2015-04-24 DIAGNOSIS — E785 Hyperlipidemia, unspecified: Secondary | ICD-10-CM | POA: Diagnosis not present

## 2015-04-24 NOTE — Telephone Encounter (Signed)
[facility] following [facility]   [phone]  [phone].  Andrey Campanile

## 2015-04-25 ENCOUNTER — Ambulatory Visit (HOSPITAL_COMMUNITY): Payer: Medicare Other | Attending: Cardiology

## 2015-04-25 ENCOUNTER — Other Ambulatory Visit: Payer: Self-pay

## 2015-04-25 DIAGNOSIS — I5032 Chronic diastolic (congestive) heart failure: Secondary | ICD-10-CM | POA: Insufficient documentation

## 2015-04-25 DIAGNOSIS — I110 Hypertensive heart disease with heart failure: Secondary | ICD-10-CM | POA: Insufficient documentation

## 2015-04-25 DIAGNOSIS — E119 Type 2 diabetes mellitus without complications: Secondary | ICD-10-CM | POA: Insufficient documentation

## 2015-04-25 DIAGNOSIS — I10 Essential (primary) hypertension: Secondary | ICD-10-CM | POA: Diagnosis not present

## 2015-04-25 DIAGNOSIS — I358 Other nonrheumatic aortic valve disorders: Secondary | ICD-10-CM | POA: Diagnosis not present

## 2015-04-25 DIAGNOSIS — E669 Obesity, unspecified: Secondary | ICD-10-CM | POA: Diagnosis not present

## 2015-04-25 DIAGNOSIS — J441 Chronic obstructive pulmonary disease with (acute) exacerbation: Secondary | ICD-10-CM | POA: Diagnosis not present

## 2015-04-25 DIAGNOSIS — E785 Hyperlipidemia, unspecified: Secondary | ICD-10-CM | POA: Diagnosis not present

## 2015-04-25 DIAGNOSIS — E1142 Type 2 diabetes mellitus with diabetic polyneuropathy: Secondary | ICD-10-CM | POA: Diagnosis not present

## 2015-04-25 DIAGNOSIS — I059 Rheumatic mitral valve disease, unspecified: Secondary | ICD-10-CM | POA: Diagnosis not present

## 2015-04-25 DIAGNOSIS — Z6841 Body Mass Index (BMI) 40.0 and over, adult: Secondary | ICD-10-CM | POA: Diagnosis not present

## 2015-04-25 MED ORDER — PERFLUTREN LIPID MICROSPHERE
1.0000 mL | INTRAVENOUS | Status: AC | PRN
  Administered 2015-04-25: 3 mL via INTRAVENOUS

## 2015-04-26 DIAGNOSIS — E785 Hyperlipidemia, unspecified: Secondary | ICD-10-CM | POA: Diagnosis not present

## 2015-04-26 DIAGNOSIS — E669 Obesity, unspecified: Secondary | ICD-10-CM | POA: Diagnosis not present

## 2015-04-26 DIAGNOSIS — J441 Chronic obstructive pulmonary disease with (acute) exacerbation: Secondary | ICD-10-CM | POA: Diagnosis not present

## 2015-04-26 DIAGNOSIS — I10 Essential (primary) hypertension: Secondary | ICD-10-CM | POA: Diagnosis not present

## 2015-04-26 DIAGNOSIS — I5032 Chronic diastolic (congestive) heart failure: Secondary | ICD-10-CM | POA: Diagnosis not present

## 2015-04-26 DIAGNOSIS — E1142 Type 2 diabetes mellitus with diabetic polyneuropathy: Secondary | ICD-10-CM | POA: Diagnosis not present

## 2015-04-30 ENCOUNTER — Other Ambulatory Visit: Payer: Self-pay | Admitting: Emergency Medicine

## 2015-04-30 DIAGNOSIS — E669 Obesity, unspecified: Secondary | ICD-10-CM | POA: Diagnosis not present

## 2015-04-30 DIAGNOSIS — I5032 Chronic diastolic (congestive) heart failure: Secondary | ICD-10-CM | POA: Diagnosis not present

## 2015-04-30 DIAGNOSIS — E1142 Type 2 diabetes mellitus with diabetic polyneuropathy: Secondary | ICD-10-CM | POA: Diagnosis not present

## 2015-04-30 DIAGNOSIS — J441 Chronic obstructive pulmonary disease with (acute) exacerbation: Secondary | ICD-10-CM | POA: Diagnosis not present

## 2015-04-30 DIAGNOSIS — E785 Hyperlipidemia, unspecified: Secondary | ICD-10-CM | POA: Diagnosis not present

## 2015-04-30 DIAGNOSIS — I10 Essential (primary) hypertension: Secondary | ICD-10-CM | POA: Diagnosis not present

## 2015-05-01 DIAGNOSIS — E785 Hyperlipidemia, unspecified: Secondary | ICD-10-CM | POA: Diagnosis not present

## 2015-05-01 DIAGNOSIS — E1142 Type 2 diabetes mellitus with diabetic polyneuropathy: Secondary | ICD-10-CM | POA: Diagnosis not present

## 2015-05-01 DIAGNOSIS — I5032 Chronic diastolic (congestive) heart failure: Secondary | ICD-10-CM | POA: Diagnosis not present

## 2015-05-01 DIAGNOSIS — E669 Obesity, unspecified: Secondary | ICD-10-CM | POA: Diagnosis not present

## 2015-05-01 DIAGNOSIS — J441 Chronic obstructive pulmonary disease with (acute) exacerbation: Secondary | ICD-10-CM | POA: Diagnosis not present

## 2015-05-01 DIAGNOSIS — I10 Essential (primary) hypertension: Secondary | ICD-10-CM | POA: Diagnosis not present

## 2015-05-03 DIAGNOSIS — M545 Low back pain: Secondary | ICD-10-CM | POA: Diagnosis not present

## 2015-05-03 DIAGNOSIS — G894 Chronic pain syndrome: Secondary | ICD-10-CM | POA: Diagnosis not present

## 2015-05-03 DIAGNOSIS — M79643 Pain in unspecified hand: Secondary | ICD-10-CM | POA: Diagnosis not present

## 2015-05-03 DIAGNOSIS — Z79899 Other long term (current) drug therapy: Secondary | ICD-10-CM | POA: Diagnosis not present

## 2015-05-04 DIAGNOSIS — E1142 Type 2 diabetes mellitus with diabetic polyneuropathy: Secondary | ICD-10-CM | POA: Diagnosis not present

## 2015-05-04 DIAGNOSIS — I10 Essential (primary) hypertension: Secondary | ICD-10-CM | POA: Diagnosis not present

## 2015-05-04 DIAGNOSIS — E785 Hyperlipidemia, unspecified: Secondary | ICD-10-CM | POA: Diagnosis not present

## 2015-05-04 DIAGNOSIS — J441 Chronic obstructive pulmonary disease with (acute) exacerbation: Secondary | ICD-10-CM | POA: Diagnosis not present

## 2015-05-04 DIAGNOSIS — E669 Obesity, unspecified: Secondary | ICD-10-CM | POA: Diagnosis not present

## 2015-05-04 DIAGNOSIS — I5032 Chronic diastolic (congestive) heart failure: Secondary | ICD-10-CM | POA: Diagnosis not present

## 2015-05-07 DIAGNOSIS — I5032 Chronic diastolic (congestive) heart failure: Secondary | ICD-10-CM | POA: Diagnosis not present

## 2015-05-07 DIAGNOSIS — E785 Hyperlipidemia, unspecified: Secondary | ICD-10-CM | POA: Diagnosis not present

## 2015-05-07 DIAGNOSIS — I10 Essential (primary) hypertension: Secondary | ICD-10-CM | POA: Diagnosis not present

## 2015-05-07 DIAGNOSIS — E1142 Type 2 diabetes mellitus with diabetic polyneuropathy: Secondary | ICD-10-CM | POA: Diagnosis not present

## 2015-05-07 DIAGNOSIS — J441 Chronic obstructive pulmonary disease with (acute) exacerbation: Secondary | ICD-10-CM | POA: Diagnosis not present

## 2015-05-07 DIAGNOSIS — E669 Obesity, unspecified: Secondary | ICD-10-CM | POA: Diagnosis not present

## 2015-05-08 DIAGNOSIS — I10 Essential (primary) hypertension: Secondary | ICD-10-CM | POA: Diagnosis not present

## 2015-05-08 DIAGNOSIS — J441 Chronic obstructive pulmonary disease with (acute) exacerbation: Secondary | ICD-10-CM | POA: Diagnosis not present

## 2015-05-08 DIAGNOSIS — E1142 Type 2 diabetes mellitus with diabetic polyneuropathy: Secondary | ICD-10-CM | POA: Diagnosis not present

## 2015-05-08 DIAGNOSIS — E669 Obesity, unspecified: Secondary | ICD-10-CM | POA: Diagnosis not present

## 2015-05-08 DIAGNOSIS — E785 Hyperlipidemia, unspecified: Secondary | ICD-10-CM | POA: Diagnosis not present

## 2015-05-08 DIAGNOSIS — I5032 Chronic diastolic (congestive) heart failure: Secondary | ICD-10-CM | POA: Diagnosis not present

## 2015-05-10 DIAGNOSIS — E669 Obesity, unspecified: Secondary | ICD-10-CM | POA: Diagnosis not present

## 2015-05-10 DIAGNOSIS — E1142 Type 2 diabetes mellitus with diabetic polyneuropathy: Secondary | ICD-10-CM | POA: Diagnosis not present

## 2015-05-10 DIAGNOSIS — J441 Chronic obstructive pulmonary disease with (acute) exacerbation: Secondary | ICD-10-CM | POA: Diagnosis not present

## 2015-05-10 DIAGNOSIS — I5032 Chronic diastolic (congestive) heart failure: Secondary | ICD-10-CM | POA: Diagnosis not present

## 2015-05-10 DIAGNOSIS — I10 Essential (primary) hypertension: Secondary | ICD-10-CM | POA: Diagnosis not present

## 2015-05-10 DIAGNOSIS — E785 Hyperlipidemia, unspecified: Secondary | ICD-10-CM | POA: Diagnosis not present

## 2015-05-11 DIAGNOSIS — E785 Hyperlipidemia, unspecified: Secondary | ICD-10-CM | POA: Diagnosis not present

## 2015-05-11 DIAGNOSIS — E669 Obesity, unspecified: Secondary | ICD-10-CM | POA: Diagnosis not present

## 2015-05-11 DIAGNOSIS — J441 Chronic obstructive pulmonary disease with (acute) exacerbation: Secondary | ICD-10-CM | POA: Diagnosis not present

## 2015-05-11 DIAGNOSIS — I10 Essential (primary) hypertension: Secondary | ICD-10-CM | POA: Diagnosis not present

## 2015-05-11 DIAGNOSIS — I5032 Chronic diastolic (congestive) heart failure: Secondary | ICD-10-CM | POA: Diagnosis not present

## 2015-05-11 DIAGNOSIS — E1142 Type 2 diabetes mellitus with diabetic polyneuropathy: Secondary | ICD-10-CM | POA: Diagnosis not present

## 2015-05-14 DIAGNOSIS — I5032 Chronic diastolic (congestive) heart failure: Secondary | ICD-10-CM | POA: Diagnosis not present

## 2015-05-14 DIAGNOSIS — I10 Essential (primary) hypertension: Secondary | ICD-10-CM | POA: Diagnosis not present

## 2015-05-14 DIAGNOSIS — E669 Obesity, unspecified: Secondary | ICD-10-CM | POA: Diagnosis not present

## 2015-05-14 DIAGNOSIS — J441 Chronic obstructive pulmonary disease with (acute) exacerbation: Secondary | ICD-10-CM | POA: Diagnosis not present

## 2015-05-14 DIAGNOSIS — E1142 Type 2 diabetes mellitus with diabetic polyneuropathy: Secondary | ICD-10-CM | POA: Diagnosis not present

## 2015-05-14 DIAGNOSIS — E785 Hyperlipidemia, unspecified: Secondary | ICD-10-CM | POA: Diagnosis not present

## 2015-05-15 DIAGNOSIS — I10 Essential (primary) hypertension: Secondary | ICD-10-CM | POA: Diagnosis not present

## 2015-05-15 DIAGNOSIS — I5032 Chronic diastolic (congestive) heart failure: Secondary | ICD-10-CM | POA: Diagnosis not present

## 2015-05-15 DIAGNOSIS — J441 Chronic obstructive pulmonary disease with (acute) exacerbation: Secondary | ICD-10-CM | POA: Diagnosis not present

## 2015-05-15 DIAGNOSIS — E1142 Type 2 diabetes mellitus with diabetic polyneuropathy: Secondary | ICD-10-CM | POA: Diagnosis not present

## 2015-05-15 DIAGNOSIS — E669 Obesity, unspecified: Secondary | ICD-10-CM | POA: Diagnosis not present

## 2015-05-15 DIAGNOSIS — E785 Hyperlipidemia, unspecified: Secondary | ICD-10-CM | POA: Diagnosis not present

## 2015-05-17 ENCOUNTER — Other Ambulatory Visit: Payer: Self-pay | Admitting: Emergency Medicine

## 2015-05-18 ENCOUNTER — Other Ambulatory Visit: Payer: Self-pay | Admitting: Emergency Medicine

## 2015-05-18 DIAGNOSIS — E669 Obesity, unspecified: Secondary | ICD-10-CM | POA: Diagnosis not present

## 2015-05-18 DIAGNOSIS — I5032 Chronic diastolic (congestive) heart failure: Secondary | ICD-10-CM | POA: Diagnosis not present

## 2015-05-18 DIAGNOSIS — J441 Chronic obstructive pulmonary disease with (acute) exacerbation: Secondary | ICD-10-CM | POA: Diagnosis not present

## 2015-05-18 DIAGNOSIS — E1142 Type 2 diabetes mellitus with diabetic polyneuropathy: Secondary | ICD-10-CM | POA: Diagnosis not present

## 2015-05-18 DIAGNOSIS — I10 Essential (primary) hypertension: Secondary | ICD-10-CM | POA: Diagnosis not present

## 2015-05-18 DIAGNOSIS — E785 Hyperlipidemia, unspecified: Secondary | ICD-10-CM | POA: Diagnosis not present

## 2015-05-21 ENCOUNTER — Telehealth: Payer: Self-pay

## 2015-05-21 DIAGNOSIS — I5032 Chronic diastolic (congestive) heart failure: Secondary | ICD-10-CM | POA: Diagnosis not present

## 2015-05-21 DIAGNOSIS — E669 Obesity, unspecified: Secondary | ICD-10-CM | POA: Diagnosis not present

## 2015-05-21 DIAGNOSIS — E785 Hyperlipidemia, unspecified: Secondary | ICD-10-CM | POA: Diagnosis not present

## 2015-05-21 DIAGNOSIS — I10 Essential (primary) hypertension: Secondary | ICD-10-CM | POA: Diagnosis not present

## 2015-05-21 DIAGNOSIS — J441 Chronic obstructive pulmonary disease with (acute) exacerbation: Secondary | ICD-10-CM | POA: Diagnosis not present

## 2015-05-21 DIAGNOSIS — E1142 Type 2 diabetes mellitus with diabetic polyneuropathy: Secondary | ICD-10-CM | POA: Diagnosis not present

## 2015-05-21 NOTE — Telephone Encounter (Signed)
Sue Lushndrea from [facility] states they need orders to continue seeing pt to do his meds and bp for 4 more weeks. Please call [phone]

## 2015-05-21 NOTE — Telephone Encounter (Signed)
Please give okay for patient to have continue with [facility] the next 4 weeks.

## 2015-05-22 DIAGNOSIS — E1142 Type 2 diabetes mellitus with diabetic polyneuropathy: Secondary | ICD-10-CM | POA: Diagnosis not present

## 2015-05-22 DIAGNOSIS — E785 Hyperlipidemia, unspecified: Secondary | ICD-10-CM | POA: Diagnosis not present

## 2015-05-22 DIAGNOSIS — J441 Chronic obstructive pulmonary disease with (acute) exacerbation: Secondary | ICD-10-CM | POA: Diagnosis not present

## 2015-05-22 DIAGNOSIS — E669 Obesity, unspecified: Secondary | ICD-10-CM | POA: Diagnosis not present

## 2015-05-22 DIAGNOSIS — I10 Essential (primary) hypertension: Secondary | ICD-10-CM | POA: Diagnosis not present

## 2015-05-22 DIAGNOSIS — I5032 Chronic diastolic (congestive) heart failure: Secondary | ICD-10-CM | POA: Diagnosis not present

## 2015-05-22 NOTE — Telephone Encounter (Signed)
Darra LisAdvised Andrea.

## 2015-05-24 DIAGNOSIS — E1142 Type 2 diabetes mellitus with diabetic polyneuropathy: Secondary | ICD-10-CM | POA: Diagnosis not present

## 2015-05-24 DIAGNOSIS — E669 Obesity, unspecified: Secondary | ICD-10-CM | POA: Diagnosis not present

## 2015-05-24 DIAGNOSIS — J441 Chronic obstructive pulmonary disease with (acute) exacerbation: Secondary | ICD-10-CM | POA: Diagnosis not present

## 2015-05-24 DIAGNOSIS — I10 Essential (primary) hypertension: Secondary | ICD-10-CM | POA: Diagnosis not present

## 2015-05-24 DIAGNOSIS — E785 Hyperlipidemia, unspecified: Secondary | ICD-10-CM | POA: Diagnosis not present

## 2015-05-24 DIAGNOSIS — I5032 Chronic diastolic (congestive) heart failure: Secondary | ICD-10-CM | POA: Diagnosis not present

## 2015-05-25 DIAGNOSIS — I10 Essential (primary) hypertension: Secondary | ICD-10-CM | POA: Diagnosis not present

## 2015-05-25 DIAGNOSIS — E1142 Type 2 diabetes mellitus with diabetic polyneuropathy: Secondary | ICD-10-CM | POA: Diagnosis not present

## 2015-05-25 DIAGNOSIS — I5032 Chronic diastolic (congestive) heart failure: Secondary | ICD-10-CM | POA: Diagnosis not present

## 2015-05-25 DIAGNOSIS — E785 Hyperlipidemia, unspecified: Secondary | ICD-10-CM | POA: Diagnosis not present

## 2015-05-25 DIAGNOSIS — J441 Chronic obstructive pulmonary disease with (acute) exacerbation: Secondary | ICD-10-CM | POA: Diagnosis not present

## 2015-05-25 DIAGNOSIS — E669 Obesity, unspecified: Secondary | ICD-10-CM | POA: Diagnosis not present

## 2015-05-27 DIAGNOSIS — E1142 Type 2 diabetes mellitus with diabetic polyneuropathy: Secondary | ICD-10-CM | POA: Diagnosis not present

## 2015-05-27 DIAGNOSIS — I10 Essential (primary) hypertension: Secondary | ICD-10-CM | POA: Diagnosis not present

## 2015-05-27 DIAGNOSIS — E669 Obesity, unspecified: Secondary | ICD-10-CM | POA: Diagnosis not present

## 2015-05-27 DIAGNOSIS — E785 Hyperlipidemia, unspecified: Secondary | ICD-10-CM | POA: Diagnosis not present

## 2015-05-27 DIAGNOSIS — I5032 Chronic diastolic (congestive) heart failure: Secondary | ICD-10-CM | POA: Diagnosis not present

## 2015-05-27 DIAGNOSIS — G4733 Obstructive sleep apnea (adult) (pediatric): Secondary | ICD-10-CM | POA: Diagnosis not present

## 2015-05-27 DIAGNOSIS — J441 Chronic obstructive pulmonary disease with (acute) exacerbation: Secondary | ICD-10-CM | POA: Diagnosis not present

## 2015-05-27 DIAGNOSIS — Z794 Long term (current) use of insulin: Secondary | ICD-10-CM | POA: Diagnosis not present

## 2015-05-28 DIAGNOSIS — I10 Essential (primary) hypertension: Secondary | ICD-10-CM | POA: Diagnosis not present

## 2015-05-28 DIAGNOSIS — E669 Obesity, unspecified: Secondary | ICD-10-CM | POA: Diagnosis not present

## 2015-05-28 DIAGNOSIS — E785 Hyperlipidemia, unspecified: Secondary | ICD-10-CM | POA: Diagnosis not present

## 2015-05-28 DIAGNOSIS — J441 Chronic obstructive pulmonary disease with (acute) exacerbation: Secondary | ICD-10-CM | POA: Diagnosis not present

## 2015-05-28 DIAGNOSIS — I5032 Chronic diastolic (congestive) heart failure: Secondary | ICD-10-CM | POA: Diagnosis not present

## 2015-05-28 DIAGNOSIS — E1142 Type 2 diabetes mellitus with diabetic polyneuropathy: Secondary | ICD-10-CM | POA: Diagnosis not present

## 2015-05-29 ENCOUNTER — Telehealth: Payer: Self-pay

## 2015-05-29 NOTE — Telephone Encounter (Signed)
Home health nurse cindy mayor is needing see if the liberty form has been completed about personal care -they state it was sent march 2nd   Best number [phone]

## 2015-05-30 NOTE — Telephone Encounter (Signed)
In your box

## 2015-05-31 ENCOUNTER — Ambulatory Visit: Payer: Medicare Other | Admitting: Emergency Medicine

## 2015-05-31 ENCOUNTER — Telehealth: Payer: Self-pay | Admitting: *Deleted

## 2015-05-31 DIAGNOSIS — E785 Hyperlipidemia, unspecified: Secondary | ICD-10-CM | POA: Diagnosis not present

## 2015-05-31 DIAGNOSIS — I10 Essential (primary) hypertension: Secondary | ICD-10-CM | POA: Diagnosis not present

## 2015-05-31 DIAGNOSIS — E1142 Type 2 diabetes mellitus with diabetic polyneuropathy: Secondary | ICD-10-CM | POA: Diagnosis not present

## 2015-05-31 DIAGNOSIS — M545 Low back pain: Secondary | ICD-10-CM | POA: Diagnosis not present

## 2015-05-31 DIAGNOSIS — G894 Chronic pain syndrome: Secondary | ICD-10-CM | POA: Diagnosis not present

## 2015-05-31 DIAGNOSIS — I5032 Chronic diastolic (congestive) heart failure: Secondary | ICD-10-CM | POA: Diagnosis not present

## 2015-05-31 DIAGNOSIS — M79643 Pain in unspecified hand: Secondary | ICD-10-CM | POA: Diagnosis not present

## 2015-05-31 DIAGNOSIS — J441 Chronic obstructive pulmonary disease with (acute) exacerbation: Secondary | ICD-10-CM | POA: Diagnosis not present

## 2015-05-31 DIAGNOSIS — Z79891 Long term (current) use of opiate analgesic: Secondary | ICD-10-CM | POA: Diagnosis not present

## 2015-05-31 DIAGNOSIS — Z79899 Other long term (current) drug therapy: Secondary | ICD-10-CM | POA: Diagnosis not present

## 2015-05-31 DIAGNOSIS — E669 Obesity, unspecified: Secondary | ICD-10-CM | POA: Diagnosis not present

## 2015-05-31 NOTE — Telephone Encounter (Signed)
Have them resend the note. This was to be faxed but I don't know if it was done or not.

## 2015-05-31 NOTE — Telephone Encounter (Signed)
Faxed signed orders to [employer]. Confirmation received at 3:15 pm.

## 2015-06-01 ENCOUNTER — Other Ambulatory Visit: Payer: Self-pay | Admitting: Emergency Medicine

## 2015-06-01 DIAGNOSIS — E669 Obesity, unspecified: Secondary | ICD-10-CM | POA: Diagnosis not present

## 2015-06-01 DIAGNOSIS — J441 Chronic obstructive pulmonary disease with (acute) exacerbation: Secondary | ICD-10-CM | POA: Diagnosis not present

## 2015-06-01 DIAGNOSIS — I10 Essential (primary) hypertension: Secondary | ICD-10-CM | POA: Diagnosis not present

## 2015-06-01 DIAGNOSIS — I5032 Chronic diastolic (congestive) heart failure: Secondary | ICD-10-CM | POA: Diagnosis not present

## 2015-06-01 DIAGNOSIS — E785 Hyperlipidemia, unspecified: Secondary | ICD-10-CM | POA: Diagnosis not present

## 2015-06-01 DIAGNOSIS — E1142 Type 2 diabetes mellitus with diabetic polyneuropathy: Secondary | ICD-10-CM | POA: Diagnosis not present

## 2015-06-04 NOTE — Telephone Encounter (Signed)
Faxed

## 2015-06-06 DIAGNOSIS — I10 Essential (primary) hypertension: Secondary | ICD-10-CM | POA: Diagnosis not present

## 2015-06-06 DIAGNOSIS — E785 Hyperlipidemia, unspecified: Secondary | ICD-10-CM | POA: Diagnosis not present

## 2015-06-06 DIAGNOSIS — E1142 Type 2 diabetes mellitus with diabetic polyneuropathy: Secondary | ICD-10-CM | POA: Diagnosis not present

## 2015-06-06 DIAGNOSIS — I5032 Chronic diastolic (congestive) heart failure: Secondary | ICD-10-CM | POA: Diagnosis not present

## 2015-06-06 DIAGNOSIS — E669 Obesity, unspecified: Secondary | ICD-10-CM | POA: Diagnosis not present

## 2015-06-06 DIAGNOSIS — J441 Chronic obstructive pulmonary disease with (acute) exacerbation: Secondary | ICD-10-CM | POA: Diagnosis not present

## 2015-06-11 ENCOUNTER — Other Ambulatory Visit: Payer: Self-pay | Admitting: Emergency Medicine

## 2015-06-13 DIAGNOSIS — I10 Essential (primary) hypertension: Secondary | ICD-10-CM | POA: Diagnosis not present

## 2015-06-13 DIAGNOSIS — J441 Chronic obstructive pulmonary disease with (acute) exacerbation: Secondary | ICD-10-CM | POA: Diagnosis not present

## 2015-06-13 DIAGNOSIS — E1142 Type 2 diabetes mellitus with diabetic polyneuropathy: Secondary | ICD-10-CM | POA: Diagnosis not present

## 2015-06-13 DIAGNOSIS — E785 Hyperlipidemia, unspecified: Secondary | ICD-10-CM | POA: Diagnosis not present

## 2015-06-13 DIAGNOSIS — I5032 Chronic diastolic (congestive) heart failure: Secondary | ICD-10-CM | POA: Diagnosis not present

## 2015-06-13 DIAGNOSIS — E669 Obesity, unspecified: Secondary | ICD-10-CM | POA: Diagnosis not present

## 2015-06-14 DIAGNOSIS — E1142 Type 2 diabetes mellitus with diabetic polyneuropathy: Secondary | ICD-10-CM | POA: Diagnosis not present

## 2015-06-14 DIAGNOSIS — J441 Chronic obstructive pulmonary disease with (acute) exacerbation: Secondary | ICD-10-CM | POA: Diagnosis not present

## 2015-06-14 DIAGNOSIS — I10 Essential (primary) hypertension: Secondary | ICD-10-CM | POA: Diagnosis not present

## 2015-06-14 DIAGNOSIS — I5032 Chronic diastolic (congestive) heart failure: Secondary | ICD-10-CM | POA: Diagnosis not present

## 2015-06-14 DIAGNOSIS — E669 Obesity, unspecified: Secondary | ICD-10-CM | POA: Diagnosis not present

## 2015-06-14 DIAGNOSIS — E785 Hyperlipidemia, unspecified: Secondary | ICD-10-CM | POA: Diagnosis not present

## 2015-06-18 DIAGNOSIS — E1142 Type 2 diabetes mellitus with diabetic polyneuropathy: Secondary | ICD-10-CM | POA: Diagnosis not present

## 2015-06-18 DIAGNOSIS — J441 Chronic obstructive pulmonary disease with (acute) exacerbation: Secondary | ICD-10-CM | POA: Diagnosis not present

## 2015-06-18 DIAGNOSIS — I10 Essential (primary) hypertension: Secondary | ICD-10-CM | POA: Diagnosis not present

## 2015-06-18 DIAGNOSIS — E785 Hyperlipidemia, unspecified: Secondary | ICD-10-CM | POA: Diagnosis not present

## 2015-06-18 DIAGNOSIS — I5032 Chronic diastolic (congestive) heart failure: Secondary | ICD-10-CM | POA: Diagnosis not present

## 2015-06-18 DIAGNOSIS — E669 Obesity, unspecified: Secondary | ICD-10-CM | POA: Diagnosis not present

## 2015-06-20 DIAGNOSIS — I10 Essential (primary) hypertension: Secondary | ICD-10-CM | POA: Diagnosis not present

## 2015-06-20 DIAGNOSIS — E669 Obesity, unspecified: Secondary | ICD-10-CM | POA: Diagnosis not present

## 2015-06-20 DIAGNOSIS — J441 Chronic obstructive pulmonary disease with (acute) exacerbation: Secondary | ICD-10-CM | POA: Diagnosis not present

## 2015-06-20 DIAGNOSIS — E785 Hyperlipidemia, unspecified: Secondary | ICD-10-CM | POA: Diagnosis not present

## 2015-06-20 DIAGNOSIS — I5032 Chronic diastolic (congestive) heart failure: Secondary | ICD-10-CM | POA: Diagnosis not present

## 2015-06-20 DIAGNOSIS — E1142 Type 2 diabetes mellitus with diabetic polyneuropathy: Secondary | ICD-10-CM | POA: Diagnosis not present

## 2015-06-21 ENCOUNTER — Encounter: Payer: Self-pay | Admitting: Emergency Medicine

## 2015-06-21 ENCOUNTER — Ambulatory Visit (INDEPENDENT_AMBULATORY_CARE_PROVIDER_SITE_OTHER): Payer: Medicare Other | Admitting: Emergency Medicine

## 2015-06-21 VITALS — BP 135/84 | HR 118 | Temp 100.1°F | Resp 16

## 2015-06-21 DIAGNOSIS — G4733 Obstructive sleep apnea (adult) (pediatric): Secondary | ICD-10-CM

## 2015-06-21 DIAGNOSIS — J441 Chronic obstructive pulmonary disease with (acute) exacerbation: Secondary | ICD-10-CM | POA: Diagnosis not present

## 2015-06-21 DIAGNOSIS — J449 Chronic obstructive pulmonary disease, unspecified: Secondary | ICD-10-CM | POA: Diagnosis not present

## 2015-06-21 DIAGNOSIS — E1149 Type 2 diabetes mellitus with other diabetic neurological complication: Secondary | ICD-10-CM

## 2015-06-21 LAB — POCT GLYCOSYLATED HEMOGLOBIN (HGB A1C): HEMOGLOBIN A1C: 10.3

## 2015-06-21 MED ORDER — DOXYCYCLINE HYCLATE 100 MG PO TABS: 100.0000 mg | ORAL_TABLET | Freq: Two times a day (BID) | ORAL | Status: DC

## 2015-06-21 NOTE — Patient Instructions (Addendum)
Please take 90 units of Lantus each day. For blood sugar between 150- 200   give 10 units of NovoLog. For blood sugar 200-300 give 15 units of NovoLog. For blood sugar 300-400 give 20 units of NovoLog. For blood sugar greater than 400 give 25 units of NovoLog Check your blood sugar before each meal and give your insulin at bedtime. Please call [facility] and see if they can help you with portable oxygen  IF you received an x-ray today, you will receive an invoice from [facility]. Please contact [facility] at [phone] with questions or concerns regarding your invoice.   IF you received labwork today, you will receive an invoice from [facility]. Please contact [facility] at [phone] with questions or concerns regarding your invoice.   Our billing staff will not be able to assist you with questions regarding bills from these companies.  You will be contacted with the lab results as soon as they are available. The fastest way to get your results is to activate your My Chart account. Instructions are located on the last page of this paperwork. If you have not heard from us regarding the results in 2 weeks, please contact this office.

## 2015-06-21 NOTE — Progress Notes (Addendum)
Patient ID: Rigby Leonhardt, male   DOB: Jul 11, 1955, 60 y.o.   MRN: [id number]     By signing my name below, I, Zola Button, attest that this documentation has been prepared under the direction and in the presence of Arlyss Queen, MD.  Electronically Signed: Zola Button, Medical Scribe. 06/21/2015. 4:06 PM.   Chief Complaint:  Chief Complaint  Patient presents with  . Follow-up  . COPD  . Diabetes  . Cough    green mucus, x 4 days    HPI: Tytus Strahle is a 60 y.o. male with a history of COPD, CHF, tracheostomy dependent, obesity, and chronic respiratory failure who reports to [facility] today for a follow-up. He is still undergoing physical therapy. Patient has been trying to obtain portable oxygen. He has noticed green mucus from his tracheostomy for the past 3-4 days. He states he has been compliant with his insulin, taking 90 units of Lantus a day and sliding scale Novolin. He believes his diet is slightly better than it was previously, but he still does not watch his diet carefully and still eats a lot of sweets.  Past Medical History  Diagnosis Date  . Bronchitis   . CHF (congestive heart failure) ([place])   . DM type 2 with diabetic peripheral neuropathy ([place])   . Hypertension   . Hypercholesteremia   . Hearing loss   . Peripheral neuropathy ([place])   . Sleep apnea     Severe sleep apnea requiring tracheostomy  . COPD (chronic obstructive pulmonary disease) [facility])    Past Surgical History  Procedure Laterality Date  . Tracheostomy    . Appendectomy    . Hand surgery    . Femur surgery    . Multiple extractions with alveoloplasty N/A 05/11/2012    Procedure: MULTIPLE EXTRACTION WITH ALVEOLOPLASTY;  Surgeon: Gae Bon, DDS;  Location: [place];  Service: Oral Surgery;  Laterality: N/A;  . Femur hardware removal  09/01/2003    Removal of retained hardware, two interlocking distal femoral  . Cholecystectomy N/A 01/05/2014    Procedure: LAPAROSCOPIC CHOLECYSTECTOMY;  Surgeon: Rolm Bookbinder,  MD;  Location: [facility];  Service: General;  Laterality: N/A;   Social History   Social History  . Marital Status: Single    Spouse Name: N/A  . Number of Children: N/A  . Years of Education: N/A   Social History Main Topics  . Smoking status: Former Smoker -- 2.00 packs/day for 30 years    Types: Cigarettes    Quit date: 07/02/1999  . Smokeless tobacco: Never Used  . Alcohol Use: No  . Drug Use: No  . Sexual Activity: No   Other Topics Concern  . None   Social History Narrative   Family History  Problem Relation Age of Onset  . Coronary artery disease Father    No Known Allergies Prior to Admission medications   Medication Sig Start Date End Date Taking? Authorizing Provider  ACCU-CHEK FASTCLIX LANCETS MISC USE AS DIRECTED 03/30/15   Darlyne Russian, MD  ACCU-CHEK SMARTVIEW test strip USE TO CHECK BLOOD SUGAR UP TO FOUR TIMES DAILY 03/30/15   Darlyne Russian, MD  ADVAIR DISKUS 500-50 MCG/DOSE AEPB INHALE 1 PUFF BY MOUTH INTO THE LUNGS TWICE DAILY 03/05/15   Darlyne Russian, MD  albuterol (PROVENTIL HFA) 108 (90 BASE) MCG/ACT inhaler INHALE 2 PUFFS BY MOUTH EVERY 4 TO 6 HOURS AS NEEDED.  "OV NEEDED FOR REFILLS" 11/07/14   Harrison Mons, PA-C  albuterol (PROVENTIL) (2.5 MG/3ML)  0.083% nebulizer solution Take 3 mLs (2.5 mg total) by nebulization every 6 (six) hours as needed. Dx J44.1 03/30/15   Darlyne Russian, MD  atorvastatin (LIPITOR) 10 MG tablet TAKE 1 TABLET(10 MG) BY MOUTH DAILY 04/22/15   Ezekiel Slocumb, PA-C  blood glucose meter kit and supplies KIT Dispense based on patient and insurance preference. Use up to four times daily as directed.E11.42 03/29/15   Darlyne Russian, MD  furosemide (LASIX) 40 MG tablet TAKE 1 TABLET BY MOUTH TWICE DAILY   "OFFICE VISIT NEEDED FOR REFILLS" Patient taking differently: Take 40 mg by mouth daily.  12/11/14   Chelle Jeffery, PA-C  gabapentin (NEURONTIN) 300 MG capsule TAKE 3 TO 4 CAPSULES BY MOUTH EVERY NIGHT AT BEDTIME AS NEEDED FOR PAIN 06/11/15   Darlyne Russian, MD  GRALISE 600 MG TABS Take 3 tablets by mouth every morning.    Historical Provider, MD  Insulin Syringe-Needle U-100 (INSULIN SYRINGE .3CC/31GX5/16") 31G X 5/16" 0.3 ML MISC USE AS DIRECTED 03/29/14   Chelle Jeffery, PA-C  LANTUS 100 UNIT/ML injection ADMINISTER 90 UNITS UNDER THE SKIN EVERY DAY AS DIRECTED 06/04/15   Darlyne Russian, MD  lisinopril (PRINIVIL,ZESTRIL) 5 MG tablet TAKE 1 TABLET BY MOUTH DAILY 07/21/14   Darlyne Russian, MD  NOVOLIN R 100 UNIT/ML injection ADMINISTER 21 UNITS UNDER THE SKIN FOUR TIMES DAILY BEFORE MEALS AS NEEDED PER SLIDING SCALE 03/30/15   Darlyne Russian, MD  oxyCODONE-acetaminophen (PERCOCET) 10-325 MG per tablet Take 1 tablet by mouth every 6 (six) hours as needed for pain (pain).     Historical Provider, MD  polyethylene glycol powder (GLYCOLAX/MIRALAX) powder MIX 1 CAPFUL IN LIQUID OF CHOICE AND DRINK BY MOUTH DAILY AS DIRECTED 04/17/15   Darlyne Russian, MD  predniSONE (DELTASONE) 5 MG tablet Take 1 tablet (5 mg total) by mouth daily with breakfast. 03/24/15   Donne Hazel, MD  spironolactone (ALDACTONE) 25 MG tablet TAKE 1 TABLET BY MOUTH EVERY DAY 10/24/14   Chelle Jeffery, PA-C  tiotropium (SPIRIVA HANDIHALER) 18 MCG inhalation capsule INHALE TH CONTENTS OF 1 CAPSULE BY MOUTH VIA HANDIHALER ONCE DAILY AS DIRECTED  'OFFICE VISIT NEEDED' Patient taking differently: INHALE TH CONTENTS OF 1 CAPSULE BY MOUTH VIA HANDIHALER twice DAILY AS DIRECTED  'OFFICE VISIT NEEDED' 02/18/15   Darlyne Russian, MD  traMADol (ULTRAM) 50 MG tablet Take 100 mg by mouth every 6 (six) hours as needed for severe pain (pain).     Historical Provider, MD  UNABLE TO FIND Nebulizer machine DX: J44.9, I50.9 06/29/14   Wardell Honour, MD  UNABLE TO FIND Hand held bedside urinal. Dx code: R26.9 11/21/14   Darlyne Russian, MD  VOLTAREN 1 % GEL Apply 2 g topically daily as needed (hand pain).     Historical Provider, MD     ROS: The patient denies chills, night sweats, unintentional weight loss,  nausea, vomiting, dysuria, hematuria, melena, numbness, weakness, or tingling.  All other systems have been reviewed and were otherwise negative with the exception of those mentioned in the HPI and as above.    PHYSICAL EXAM: Filed Vitals:   06/21/15 1536  BP: 135/84  Pulse: 118  Temp: 100.1 F (37.8 C)  Resp: 16   There is no weight on file to calculate BMI.   General: Alert, no acute distress HEENT:  Normocephalic, atraumatic, oropharynx patent. Nose normal. Throat normal. Greenish discharge around his tracheostomy site. Eye: Juliette Mangle [facility] Cardiovascular:  Regular rate  and rhythm, no rubs murmurs or gallops.  No Carotid bruits, radial pulse intact. No pedal edema.  Respiratory: Bilateral wheezes with decreased breath sounds in the bases. Abdominal: No organomegaly, abdomen is soft and non-tender, positive bowel sounds.  No masses. Musculoskeletal: Gait intact. No edema, tenderness Skin: No rashes. Neurologic: Facial musculature symmetric. Psychiatric: Patient acts appropriately throughout our interaction. Lymphatic: No cervical or submandibular lymphadenopathy     LABS:  Results for orders placed or performed in visit on 06/21/15  POCT glycosylated hemoglobin (Hb A1C)  Result Value Ref Range   Hemoglobin A1C 10.3     EKG/XRAY:   Primary read interpreted by Dr. Everlene Farrier at [facility].   ASSESSMENT/PLAN: Patient has been noncompliant with his insulin. Patient has been noncompliant with diet. He recently has had a greenish discoloration to the phlegm from his tracheostomy site. He will be treated with  doxycycline for this.I placed him on a sliding scale insulin with NovoLog. 150-200 sugar gets 10 units.  200-300 sugar gets 15 units. 300-400 sugar gets 20 units. Greater than 400 sugar gets 25 units. He will continue on Lantus 90 units at night. [facility]. He does get Meals on Wheels.He does also get physical therapy at home. He is  [facility] about getting a mobile portable oxygen unit. If they are unable to help him will check with her oxygen supplier to see if we can get him some oxygen to have when he is out on his scooter.I personally performed the services described in this documentation, which was scribed in my presence. The recorded information has been reviewed and is accurate.    Gross sideeffects, risk and benefits, and alternatives of medications d/w patient. Patient is aware that all medications have potential sideeffects and we are unable to predict every sideeffect or drug-drug interaction that may occur.  Arlyss Queen MD 06/21/2015 4:06 PM

## 2015-06-23 ENCOUNTER — Other Ambulatory Visit: Payer: Self-pay | Admitting: Emergency Medicine

## 2015-06-23 DIAGNOSIS — E1142 Type 2 diabetes mellitus with diabetic polyneuropathy: Secondary | ICD-10-CM | POA: Diagnosis not present

## 2015-06-23 DIAGNOSIS — I10 Essential (primary) hypertension: Secondary | ICD-10-CM | POA: Diagnosis not present

## 2015-06-23 DIAGNOSIS — I5032 Chronic diastolic (congestive) heart failure: Secondary | ICD-10-CM | POA: Diagnosis not present

## 2015-06-23 DIAGNOSIS — E785 Hyperlipidemia, unspecified: Secondary | ICD-10-CM | POA: Diagnosis not present

## 2015-06-23 DIAGNOSIS — J441 Chronic obstructive pulmonary disease with (acute) exacerbation: Secondary | ICD-10-CM | POA: Diagnosis not present

## 2015-06-23 DIAGNOSIS — E669 Obesity, unspecified: Secondary | ICD-10-CM | POA: Diagnosis not present

## 2015-06-26 ENCOUNTER — Telehealth: Payer: Self-pay

## 2015-06-26 NOTE — Telephone Encounter (Signed)
PA approved through4/25/18. Notified pharm.

## 2015-06-26 NOTE — Telephone Encounter (Signed)
PA completed on covermymeds for Spiriva. I included notes from Dr Cleta Albertsaub that he wrote when I last did a PA for Spiriva: "pt is unstable and recently released from hosp. He was started on Spiriva several yrs ago by pulmonary specialists who want him to remain on it as it has been effective for pt. In addition to COPD, pt has tracheostomy and chronic respiratory failure". Pending.

## 2015-06-27 ENCOUNTER — Ambulatory Visit: Payer: Self-pay

## 2015-06-27 ENCOUNTER — Other Ambulatory Visit: Payer: Self-pay | Admitting: Emergency Medicine

## 2015-06-27 DIAGNOSIS — G4733 Obstructive sleep apnea (adult) (pediatric): Secondary | ICD-10-CM | POA: Diagnosis not present

## 2015-06-27 DIAGNOSIS — Z430 Encounter for attention to tracheostomy: Secondary | ICD-10-CM | POA: Diagnosis not present

## 2015-06-27 DIAGNOSIS — Z930 Tracheostomy status: Secondary | ICD-10-CM | POA: Diagnosis not present

## 2015-06-28 ENCOUNTER — Other Ambulatory Visit: Payer: Self-pay

## 2015-06-28 DIAGNOSIS — J441 Chronic obstructive pulmonary disease with (acute) exacerbation: Secondary | ICD-10-CM | POA: Diagnosis not present

## 2015-06-28 DIAGNOSIS — E1142 Type 2 diabetes mellitus with diabetic polyneuropathy: Secondary | ICD-10-CM | POA: Diagnosis not present

## 2015-06-28 DIAGNOSIS — E669 Obesity, unspecified: Secondary | ICD-10-CM | POA: Diagnosis not present

## 2015-06-28 DIAGNOSIS — I10 Essential (primary) hypertension: Secondary | ICD-10-CM | POA: Diagnosis not present

## 2015-06-28 DIAGNOSIS — E785 Hyperlipidemia, unspecified: Secondary | ICD-10-CM | POA: Diagnosis not present

## 2015-06-28 DIAGNOSIS — I5032 Chronic diastolic (congestive) heart failure: Secondary | ICD-10-CM | POA: Diagnosis not present

## 2015-06-28 DIAGNOSIS — M545 Low back pain: Secondary | ICD-10-CM | POA: Diagnosis not present

## 2015-06-28 DIAGNOSIS — Z79891 Long term (current) use of opiate analgesic: Secondary | ICD-10-CM | POA: Diagnosis not present

## 2015-06-28 DIAGNOSIS — G4733 Obstructive sleep apnea (adult) (pediatric): Secondary | ICD-10-CM | POA: Insufficient documentation

## 2015-06-28 DIAGNOSIS — Z79899 Other long term (current) drug therapy: Secondary | ICD-10-CM | POA: Diagnosis not present

## 2015-06-28 DIAGNOSIS — G894 Chronic pain syndrome: Secondary | ICD-10-CM | POA: Diagnosis not present

## 2015-06-28 DIAGNOSIS — Z430 Encounter for attention to tracheostomy: Secondary | ICD-10-CM | POA: Insufficient documentation

## 2015-06-28 DIAGNOSIS — M79643 Pain in unspecified hand: Secondary | ICD-10-CM | POA: Diagnosis not present

## 2015-06-28 NOTE — Patient Outreach (Signed)
[facility] Care Management  06/28/2015  Tyrone BaileyMark Cunningham 01/03/1956 [id number]   Telephone Screen  Referral Date:  06/22/15 Source:  MD office Issue:  COPD severe and DM uncontrolled.  Outreach call #1 to patient.  Patient reached but unable to complete call at this time.  Patient states his home health PT is working with him.   Patient interested in [facility] call back for introduction of services and Screening.  RN CM scheduled for next contact call within one week  Donato Schultzrystal Braian Tijerina, RN, BSN, [facility], CCM  [facility] Care Management Care Management Coordinator [phone] Direct [phone] Cell [phone] Office [phone] Fax

## 2015-06-29 ENCOUNTER — Ambulatory Visit: Payer: Self-pay

## 2015-07-02 ENCOUNTER — Ambulatory Visit: Payer: Medicare Other | Admitting: Adult Health

## 2015-07-03 ENCOUNTER — Ambulatory Visit: Payer: Self-pay

## 2015-07-03 ENCOUNTER — Other Ambulatory Visit: Payer: Self-pay | Admitting: Physician Assistant

## 2015-07-04 ENCOUNTER — Ambulatory Visit: Payer: Self-pay

## 2015-07-05 ENCOUNTER — Other Ambulatory Visit: Payer: Self-pay

## 2015-07-05 NOTE — Patient Outreach (Signed)
[facility] ([facility]) Care Management  07/05/2015  Keziah Guglielmo 10/25/1955 [id number]   Telephone Screen  Referral Date: 06/22/15 Source: MD office Issue: COPD severe and DM uncontrolled.  Providers: Primary MD:  Dr. Steven Daub  -  last appt: 06/21/15  HH: yes PT services.  Insurance:  Medicare and Medicaid.  Social: Patient lives alone "for most part." Mobility: states he can only walk 10 steps without having shortness of breath.  Falls: none  Pain: "always" Depression: none  Transportation:  Power chair and bus Caregiver: none  Consent:  NO - patient states "I do not want another person coming out to my house"  States he has had DM for 10 years and knows all about it.  Refuses Telephonic services as consideration for other option to [facility] engagement.   DME: oxygen, CBG meter, no scales, no cuff "states he does not know how to use a BP cuff.    [facility] conditions:  DM, COPD, CHF,  Admissions: 1 ER visits: 0 States he does not check his BS readings and takes his medication when he wants too.    Medications:  Patient taking less than 15 medications  Co-pay cost issues: no  Flu Vaccine: 12/02/14 Pneumonia Vaccine:  03/10/11 and 09/20/13  _Patient was recently discharged from hospital and all medications have been reviewed.  Objective:   Encounter Medications:  Outpatient Encounter Prescriptions as of 07/05/2015  Medication Sig  . ACCU-CHEK FASTCLIX LANCETS MISC USE AS DIRECTED  . ACCU-CHEK SMARTVIEW test strip USE TO CHECK BLOOD SUGAR UP TO FOUR TIMES DAILY  . ADVAIR DISKUS 500-50 MCG/DOSE AEPB INHALE 1 PUFF BY MOUTH INTO THE LUNGS TWICE DAILY  . albuterol (PROVENTIL HFA) 108 (90 BASE) MCG/ACT inhaler INHALE 2 PUFFS BY MOUTH EVERY 4 TO 6 HOURS AS NEEDED.  "OV NEEDED FOR REFILLS"  . albuterol (PROVENTIL) (2.5 MG/3ML) 0.083% nebulizer solution Take 3 mLs (2.5 mg total) by nebulization every 6 (six) hours as needed. Dx J44.1  . atorvastatin (LIPITOR) 10 MG tablet TAKE 1  TABLET(10 MG) BY MOUTH DAILY  . blood glucose meter kit and supplies KIT Dispense based on patient and insurance preference. Use up to four times daily as directed.E11.42  . doxycycline (VIBRA-TABS) 100 MG tablet Take 1 tablet (100 mg total) by mouth 2 (two) times daily.  . furosemide (LASIX) 40 MG tablet TAKE 1 TABLET BY MOUTH TWICE DAILY   "OFFICE VISIT NEEDED FOR REFILLS" (Patient taking differently: Take 40 mg by mouth daily. )  . gabapentin (NEURONTIN) 300 MG capsule TAKE 3 TO 4 CAPSULES BY MOUTH EVERY NIGHT AT BEDTIME AS NEEDED FOR PAIN  . GRALISE 600 MG TABS Take 3 tablets by mouth every morning.  . Insulin Syringe-Needle U-100 (INSULIN SYRINGE .3CC/31GX5/16") 31G X 5/16" 0.3 ML MISC USE AS DIRECTED  . LANTUS 100 UNIT/ML injection ADMINISTER 90 UNITS UNDER THE SKIN EVERY DAY AS DIRECTED  . lisinopril (PRINIVIL,ZESTRIL) 5 MG tablet TAKE 1 TABLET BY MOUTH DAILY  . NOVOLIN R 100 UNIT/ML injection ADMINISTER 21 UNITS UNDER THE SKIN FOUR TIMES DAILY BEFORE MEALS AS NEEDED PER SLIDING SCALE  . oxyCODONE-acetaminophen (PERCOCET) 10-325 MG per tablet Take 1 tablet by mouth every 6 (six) hours as needed for pain (pain).   . polyethylene glycol powder (GLYCOLAX/MIRALAX) powder MIX 1 CAPFUL IN LIQUID OF CHOICE AND DRINK BY MOUTH DAILY AS DIRECTED  . predniSONE (DELTASONE) 5 MG tablet Take 1 tablet (5 mg total) by mouth daily with breakfast.  . SPIRIVA HANDIHALER 18 MCG inhalation   capsule INHALE THE CONTENTS OF 1 CAPSULE ONCE DAILY AS DIRECTED  . spironolactone (ALDACTONE) 25 MG tablet TAKE 1 TABLET BY MOUTH EVERY DAY  . traMADol (ULTRAM) 50 MG tablet Take 100 mg by mouth every 6 (six) hours as needed for severe pain (pain).   Marland Kitchen UNABLE TO FIND Nebulizer machine DX: J44.9, I50.9  . UNABLE TO FIND Hand held bedside urinal. Dx code: R26.9  . VOLTAREN 1 % GEL Apply 2 g topically daily as needed (hand pain).    No facility-administered encounter medications on file as of 07/05/2015.    Functional Status:   In your present state of health, do you have any difficulty performing the following activities: 07/05/2015 03/21/2015  Hearing? N Y  Vision? N N  Difficulty concentrating or making decisions? N N  Walking or climbing stairs? Y Y  Dressing or bathing? Y N  Doing errands, shopping? Tempie Donning  Preparing Food and eating ? N -  Using the Toilet? N -  In the past six months, have you accidently leaked urine? N -  Do you have problems with loss of bowel control? N -  Managing your Medications? N -  Managing your Finances? N -  Housekeeping or managing your Housekeeping? Y -    Fall/Depression Screening: PHQ 2/9 Scores 07/05/2015 06/21/2015 03/30/2015 03/21/2015 11/09/2014 10/26/2014 07/11/2014  PHQ - 2 Score 0 0 0 0 - 0 0  PHQ- 9 Score - - - - - - -  Exception Documentation - - - - Patient refusal - -   Fall Risk  07/05/2015 06/21/2015 10/26/2014 07/11/2014  Falls in the past year? No No No No  Risk for fall due to : Impaired mobility - - -    Assessment:  Uncontrolled Diabetes, COPD and HF Disease management needs identified.   Healthcare barrier associated to patient's refusal to [facility] services even though patient advised that patient's MD made referral and healthcare needs discussed this call.    Plan: MD and patient notified via case closure letter.  [place] Management Assistant notified.    RN CM advised to please notify MD of any changes in condition prior to scheduled appt's.   RN CM provided contact name and # [phone] or main office # [phone] and 24-hour nurse line # [phone].  RN CM confirmed patient is aware of 911 services for urgent emergency needs.  Mariann Laster, RN, BSN, [facility], CCM  [facility] [employer] Management Coordinator [phone] Direct [phone] Cell [phone] Office [phone] Fax

## 2015-07-06 DIAGNOSIS — E669 Obesity, unspecified: Secondary | ICD-10-CM | POA: Diagnosis not present

## 2015-07-06 DIAGNOSIS — E1142 Type 2 diabetes mellitus with diabetic polyneuropathy: Secondary | ICD-10-CM | POA: Diagnosis not present

## 2015-07-06 DIAGNOSIS — J441 Chronic obstructive pulmonary disease with (acute) exacerbation: Secondary | ICD-10-CM | POA: Diagnosis not present

## 2015-07-06 DIAGNOSIS — I5032 Chronic diastolic (congestive) heart failure: Secondary | ICD-10-CM | POA: Diagnosis not present

## 2015-07-06 DIAGNOSIS — I10 Essential (primary) hypertension: Secondary | ICD-10-CM | POA: Diagnosis not present

## 2015-07-06 DIAGNOSIS — E785 Hyperlipidemia, unspecified: Secondary | ICD-10-CM | POA: Diagnosis not present

## 2015-07-10 ENCOUNTER — Other Ambulatory Visit: Payer: Self-pay | Admitting: Emergency Medicine

## 2015-07-10 DIAGNOSIS — E785 Hyperlipidemia, unspecified: Secondary | ICD-10-CM | POA: Diagnosis not present

## 2015-07-10 DIAGNOSIS — I10 Essential (primary) hypertension: Secondary | ICD-10-CM | POA: Diagnosis not present

## 2015-07-10 DIAGNOSIS — J441 Chronic obstructive pulmonary disease with (acute) exacerbation: Secondary | ICD-10-CM | POA: Diagnosis not present

## 2015-07-10 DIAGNOSIS — E1142 Type 2 diabetes mellitus with diabetic polyneuropathy: Secondary | ICD-10-CM | POA: Diagnosis not present

## 2015-07-10 DIAGNOSIS — I5032 Chronic diastolic (congestive) heart failure: Secondary | ICD-10-CM | POA: Diagnosis not present

## 2015-07-10 DIAGNOSIS — E669 Obesity, unspecified: Secondary | ICD-10-CM | POA: Diagnosis not present

## 2015-07-11 ENCOUNTER — Other Ambulatory Visit: Payer: Self-pay

## 2015-07-11 MED ORDER — FUROSEMIDE 40 MG PO TABS: ORAL_TABLET | ORAL | Status: DC

## 2015-07-16 ENCOUNTER — Other Ambulatory Visit: Payer: Self-pay | Admitting: Physician Assistant

## 2015-07-16 ENCOUNTER — Other Ambulatory Visit: Payer: Self-pay | Admitting: Emergency Medicine

## 2015-07-20 DIAGNOSIS — I10 Essential (primary) hypertension: Secondary | ICD-10-CM | POA: Diagnosis not present

## 2015-07-20 DIAGNOSIS — E1142 Type 2 diabetes mellitus with diabetic polyneuropathy: Secondary | ICD-10-CM | POA: Diagnosis not present

## 2015-07-20 DIAGNOSIS — I5032 Chronic diastolic (congestive) heart failure: Secondary | ICD-10-CM | POA: Diagnosis not present

## 2015-07-20 DIAGNOSIS — E669 Obesity, unspecified: Secondary | ICD-10-CM | POA: Diagnosis not present

## 2015-07-20 DIAGNOSIS — E785 Hyperlipidemia, unspecified: Secondary | ICD-10-CM | POA: Diagnosis not present

## 2015-07-20 DIAGNOSIS — J441 Chronic obstructive pulmonary disease with (acute) exacerbation: Secondary | ICD-10-CM | POA: Diagnosis not present

## 2015-07-26 DIAGNOSIS — Z79891 Long term (current) use of opiate analgesic: Secondary | ICD-10-CM | POA: Diagnosis not present

## 2015-07-26 DIAGNOSIS — Z79899 Other long term (current) drug therapy: Secondary | ICD-10-CM | POA: Diagnosis not present

## 2015-07-26 DIAGNOSIS — M79643 Pain in unspecified hand: Secondary | ICD-10-CM | POA: Diagnosis not present

## 2015-07-26 DIAGNOSIS — M545 Low back pain: Secondary | ICD-10-CM | POA: Diagnosis not present

## 2015-07-26 DIAGNOSIS — G894 Chronic pain syndrome: Secondary | ICD-10-CM | POA: Diagnosis not present

## 2015-08-01 ENCOUNTER — Other Ambulatory Visit: Payer: Self-pay | Admitting: Emergency Medicine

## 2015-08-03 ENCOUNTER — Telehealth: Payer: Self-pay

## 2015-08-03 NOTE — Telephone Encounter (Signed)
[facility]. Per protocol I faxed back their faxed request for order stamped with note that pt will need to contact us himself if he wishes to order from them.

## 2015-08-03 NOTE — Telephone Encounter (Signed)
[facility] called for pt to receive a refill on Albuterol Nebulizer solution 2.5mg  per 3ml.  Please advise pharmacy  [phone]

## 2015-08-06 ENCOUNTER — Telehealth: Payer: Self-pay

## 2015-08-06 DIAGNOSIS — R0602 Shortness of breath: Secondary | ICD-10-CM

## 2015-08-06 DIAGNOSIS — J441 Chronic obstructive pulmonary disease with (acute) exacerbation: Secondary | ICD-10-CM

## 2015-08-06 NOTE — Telephone Encounter (Signed)
Pt is needing to have his albuteral refilled  [facility] number [phone]

## 2015-08-08 ENCOUNTER — Other Ambulatory Visit: Payer: Self-pay | Admitting: Emergency Medicine

## 2015-08-08 MED ORDER — ALBUTEROL SULFATE (2.5 MG/3ML) 0.083% IN NEBU: 2.5000 mg | INHALATION_SOLUTION | Freq: Four times a day (QID) | RESPIRATORY_TRACT | Status: DC | PRN

## 2015-08-08 NOTE — Telephone Encounter (Signed)
Tried to call pt, no ans and VM is not set up. Need to clarify if neb solution or inhaler is needed. But see notes under 6/2 message. Since I can not reach pt, I will go ahead and send the req'd Rx into [facility].

## 2015-08-08 NOTE — Telephone Encounter (Signed)
Pt called back and verified that he did want the neb sol sent to [facility], which I advised I have already done. Pt will be out of this med tomorrow, so I will send in 1 box of #25 to local [facility].

## 2015-08-08 NOTE — Telephone Encounter (Signed)
Still no ans/no VM. 

## 2015-08-10 ENCOUNTER — Other Ambulatory Visit: Payer: Self-pay

## 2015-08-10 DIAGNOSIS — R0602 Shortness of breath: Secondary | ICD-10-CM

## 2015-08-10 DIAGNOSIS — J441 Chronic obstructive pulmonary disease with (acute) exacerbation: Secondary | ICD-10-CM

## 2015-08-10 MED ORDER — ALBUTEROL SULFATE (2.5 MG/3ML) 0.083% IN NEBU: 2.5000 mg | INHALATION_SOLUTION | Freq: Four times a day (QID) | RESPIRATORY_TRACT | Status: DC

## 2015-08-10 NOTE — Telephone Encounter (Signed)
Pharm called and advised that Medicare won't pay for Rx if "as needed" is on sig. They asked that we remove that from the sig. Re-sent Rx.

## 2015-08-20 ENCOUNTER — Telehealth: Payer: Self-pay

## 2015-08-20 NOTE — Telephone Encounter (Signed)
[facility] sent a Medicare form to be completed for albuterol neb solution. Completed and put in Dr Ellis Parentsaub's box.

## 2015-08-23 DIAGNOSIS — G894 Chronic pain syndrome: Secondary | ICD-10-CM | POA: Diagnosis not present

## 2015-08-23 DIAGNOSIS — Z79899 Other long term (current) drug therapy: Secondary | ICD-10-CM | POA: Diagnosis not present

## 2015-08-23 DIAGNOSIS — M79643 Pain in unspecified hand: Secondary | ICD-10-CM | POA: Diagnosis not present

## 2015-08-23 DIAGNOSIS — M545 Low back pain: Secondary | ICD-10-CM | POA: Diagnosis not present

## 2015-08-23 DIAGNOSIS — Z79891 Long term (current) use of opiate analgesic: Secondary | ICD-10-CM | POA: Diagnosis not present

## 2015-09-20 DIAGNOSIS — M545 Low back pain: Secondary | ICD-10-CM | POA: Diagnosis not present

## 2015-09-20 DIAGNOSIS — G894 Chronic pain syndrome: Secondary | ICD-10-CM | POA: Diagnosis not present

## 2015-09-20 DIAGNOSIS — Z79891 Long term (current) use of opiate analgesic: Secondary | ICD-10-CM | POA: Diagnosis not present

## 2015-09-20 DIAGNOSIS — Z79899 Other long term (current) drug therapy: Secondary | ICD-10-CM | POA: Diagnosis not present

## 2015-09-20 DIAGNOSIS — M79643 Pain in unspecified hand: Secondary | ICD-10-CM | POA: Diagnosis not present

## 2015-10-05 ENCOUNTER — Other Ambulatory Visit: Payer: Self-pay | Admitting: Emergency Medicine

## 2015-11-08 DIAGNOSIS — M79643 Pain in unspecified hand: Secondary | ICD-10-CM | POA: Diagnosis not present

## 2015-11-08 DIAGNOSIS — Z79899 Other long term (current) drug therapy: Secondary | ICD-10-CM | POA: Diagnosis not present

## 2015-11-08 DIAGNOSIS — G894 Chronic pain syndrome: Secondary | ICD-10-CM | POA: Diagnosis not present

## 2015-11-08 DIAGNOSIS — M545 Low back pain: Secondary | ICD-10-CM | POA: Diagnosis not present

## 2015-11-08 DIAGNOSIS — Z79891 Long term (current) use of opiate analgesic: Secondary | ICD-10-CM | POA: Diagnosis not present

## 2015-12-06 DIAGNOSIS — M545 Low back pain: Secondary | ICD-10-CM | POA: Diagnosis not present

## 2015-12-06 DIAGNOSIS — M79643 Pain in unspecified hand: Secondary | ICD-10-CM | POA: Diagnosis not present

## 2015-12-06 DIAGNOSIS — G894 Chronic pain syndrome: Secondary | ICD-10-CM | POA: Diagnosis not present

## 2015-12-06 DIAGNOSIS — Z79899 Other long term (current) drug therapy: Secondary | ICD-10-CM | POA: Diagnosis not present

## 2015-12-06 DIAGNOSIS — Z79891 Long term (current) use of opiate analgesic: Secondary | ICD-10-CM | POA: Diagnosis not present

## 2015-12-20 ENCOUNTER — Other Ambulatory Visit: Payer: Self-pay | Admitting: Emergency Medicine

## 2016-01-03 DIAGNOSIS — M79643 Pain in unspecified hand: Secondary | ICD-10-CM | POA: Diagnosis not present

## 2016-01-03 DIAGNOSIS — Z79891 Long term (current) use of opiate analgesic: Secondary | ICD-10-CM | POA: Diagnosis not present

## 2016-01-03 DIAGNOSIS — Z79899 Other long term (current) drug therapy: Secondary | ICD-10-CM | POA: Diagnosis not present

## 2016-01-03 DIAGNOSIS — M545 Low back pain: Secondary | ICD-10-CM | POA: Diagnosis not present

## 2016-01-03 DIAGNOSIS — G894 Chronic pain syndrome: Secondary | ICD-10-CM | POA: Diagnosis not present

## 2016-01-31 DIAGNOSIS — M545 Low back pain: Secondary | ICD-10-CM | POA: Diagnosis not present

## 2016-01-31 DIAGNOSIS — M79643 Pain in unspecified hand: Secondary | ICD-10-CM | POA: Diagnosis not present

## 2016-01-31 DIAGNOSIS — G894 Chronic pain syndrome: Secondary | ICD-10-CM | POA: Diagnosis not present

## 2016-01-31 DIAGNOSIS — Z79891 Long term (current) use of opiate analgesic: Secondary | ICD-10-CM | POA: Diagnosis not present

## 2016-01-31 DIAGNOSIS — Z79899 Other long term (current) drug therapy: Secondary | ICD-10-CM | POA: Diagnosis not present

## 2016-02-21 DIAGNOSIS — Z79891 Long term (current) use of opiate analgesic: Secondary | ICD-10-CM | POA: Diagnosis not present

## 2016-02-21 DIAGNOSIS — G894 Chronic pain syndrome: Secondary | ICD-10-CM | POA: Diagnosis not present

## 2016-02-21 DIAGNOSIS — Z79899 Other long term (current) drug therapy: Secondary | ICD-10-CM | POA: Diagnosis not present

## 2016-02-21 DIAGNOSIS — M545 Low back pain: Secondary | ICD-10-CM | POA: Diagnosis not present

## 2016-02-21 DIAGNOSIS — G5600 Carpal tunnel syndrome, unspecified upper limb: Secondary | ICD-10-CM | POA: Diagnosis not present

## 2016-02-21 DIAGNOSIS — M79643 Pain in unspecified hand: Secondary | ICD-10-CM | POA: Diagnosis not present

## 2016-03-12 DIAGNOSIS — G5600 Carpal tunnel syndrome, unspecified upper limb: Secondary | ICD-10-CM | POA: Diagnosis not present

## 2016-03-27 DIAGNOSIS — M545 Low back pain: Secondary | ICD-10-CM | POA: Diagnosis not present

## 2016-03-27 DIAGNOSIS — Z79899 Other long term (current) drug therapy: Secondary | ICD-10-CM | POA: Diagnosis not present

## 2016-03-27 DIAGNOSIS — M79643 Pain in unspecified hand: Secondary | ICD-10-CM | POA: Diagnosis not present

## 2016-03-27 DIAGNOSIS — Z79891 Long term (current) use of opiate analgesic: Secondary | ICD-10-CM | POA: Diagnosis not present

## 2016-03-27 DIAGNOSIS — G894 Chronic pain syndrome: Secondary | ICD-10-CM | POA: Diagnosis not present

## 2016-04-24 DIAGNOSIS — G894 Chronic pain syndrome: Secondary | ICD-10-CM | POA: Diagnosis not present

## 2016-04-24 DIAGNOSIS — M545 Low back pain: Secondary | ICD-10-CM | POA: Diagnosis not present

## 2016-04-24 DIAGNOSIS — M79643 Pain in unspecified hand: Secondary | ICD-10-CM | POA: Diagnosis not present

## 2016-04-24 DIAGNOSIS — Z79899 Other long term (current) drug therapy: Secondary | ICD-10-CM | POA: Diagnosis not present

## 2016-04-24 DIAGNOSIS — Z79891 Long term (current) use of opiate analgesic: Secondary | ICD-10-CM | POA: Diagnosis not present

## 2016-05-01 ENCOUNTER — Other Ambulatory Visit: Payer: Self-pay | Admitting: Emergency Medicine

## 2016-05-01 DIAGNOSIS — R0602 Shortness of breath: Secondary | ICD-10-CM

## 2016-05-01 DIAGNOSIS — J441 Chronic obstructive pulmonary disease with (acute) exacerbation: Secondary | ICD-10-CM

## 2016-05-23 DIAGNOSIS — M79643 Pain in unspecified hand: Secondary | ICD-10-CM | POA: Diagnosis not present

## 2016-05-23 DIAGNOSIS — Z79891 Long term (current) use of opiate analgesic: Secondary | ICD-10-CM | POA: Diagnosis not present

## 2016-05-23 DIAGNOSIS — G894 Chronic pain syndrome: Secondary | ICD-10-CM | POA: Diagnosis not present

## 2016-05-23 DIAGNOSIS — M545 Low back pain: Secondary | ICD-10-CM | POA: Diagnosis not present

## 2016-05-23 DIAGNOSIS — Z79899 Other long term (current) drug therapy: Secondary | ICD-10-CM | POA: Diagnosis not present

## 2016-06-03 ENCOUNTER — Other Ambulatory Visit: Payer: Self-pay | Admitting: Physician Assistant

## 2016-06-03 DIAGNOSIS — J441 Chronic obstructive pulmonary disease with (acute) exacerbation: Secondary | ICD-10-CM

## 2016-06-03 DIAGNOSIS — R0602 Shortness of breath: Secondary | ICD-10-CM

## 2016-06-19 DIAGNOSIS — G894 Chronic pain syndrome: Secondary | ICD-10-CM | POA: Diagnosis not present

## 2016-06-19 DIAGNOSIS — M545 Low back pain: Secondary | ICD-10-CM | POA: Diagnosis not present

## 2016-06-19 DIAGNOSIS — Z79899 Other long term (current) drug therapy: Secondary | ICD-10-CM | POA: Diagnosis not present

## 2016-06-19 DIAGNOSIS — M79643 Pain in unspecified hand: Secondary | ICD-10-CM | POA: Diagnosis not present

## 2016-06-19 DIAGNOSIS — Z79891 Long term (current) use of opiate analgesic: Secondary | ICD-10-CM | POA: Diagnosis not present

## 2016-07-14 ENCOUNTER — Ambulatory Visit
Admission: RE | Admit: 2016-07-14 | Discharge: 2016-07-14 | Disposition: A | Discharge status: Home / Self Care | Payer: Medicare Other | Source: Ambulatory Visit | Attending: Physician Assistant | Admitting: Physician Assistant

## 2016-07-14 ENCOUNTER — Other Ambulatory Visit: Payer: Self-pay | Admitting: Physician Assistant

## 2016-07-14 DIAGNOSIS — G8929 Other chronic pain: Secondary | ICD-10-CM

## 2016-07-14 DIAGNOSIS — M5442 Lumbago with sciatica, left side: Principal | ICD-10-CM

## 2016-07-14 DIAGNOSIS — M47816 Spondylosis without myelopathy or radiculopathy, lumbar region: Secondary | ICD-10-CM | POA: Diagnosis not present

## 2016-07-17 DIAGNOSIS — G894 Chronic pain syndrome: Secondary | ICD-10-CM | POA: Diagnosis not present

## 2016-07-17 DIAGNOSIS — M545 Low back pain: Secondary | ICD-10-CM | POA: Diagnosis not present

## 2016-07-17 DIAGNOSIS — Z79891 Long term (current) use of opiate analgesic: Secondary | ICD-10-CM | POA: Diagnosis not present

## 2016-07-17 DIAGNOSIS — Z79899 Other long term (current) drug therapy: Secondary | ICD-10-CM | POA: Diagnosis not present

## 2016-07-17 DIAGNOSIS — M79643 Pain in unspecified hand: Secondary | ICD-10-CM | POA: Diagnosis not present

## 2016-07-17 DIAGNOSIS — M47816 Spondylosis without myelopathy or radiculopathy, lumbar region: Secondary | ICD-10-CM | POA: Diagnosis not present

## 2016-07-23 ENCOUNTER — Other Ambulatory Visit: Payer: Self-pay | Admitting: Emergency Medicine

## 2016-07-23 DIAGNOSIS — J441 Chronic obstructive pulmonary disease with (acute) exacerbation: Secondary | ICD-10-CM

## 2016-07-23 DIAGNOSIS — R0602 Shortness of breath: Secondary | ICD-10-CM

## 2016-07-23 NOTE — Telephone Encounter (Signed)
Please call patient. Rx authorized. Needs to schedule follow-up and establish with new PCP since Dr. Cleta Albertsaub has retired.  Meds ordered this encounter  Medications  . albuterol (PROVENTIL) (2.5 MG/3ML) 0.083% nebulizer solution    Sig: USE 1 VIAL IN NEBULIZER EVERY 6 HOURS. MUST BE SEEN IN CLINIC FOR REFILLS Generic: VENTOLIN    Dispense:  30 vial    Refill:  0    Please notify patient that s/he needs an office visit +/- labsfor additional refills.

## 2016-08-04 ENCOUNTER — Encounter: Payer: Self-pay | Admitting: Physician Assistant

## 2016-08-04 ENCOUNTER — Other Ambulatory Visit: Payer: Self-pay | Admitting: Physician Assistant

## 2016-08-04 ENCOUNTER — Ambulatory Visit (INDEPENDENT_AMBULATORY_CARE_PROVIDER_SITE_OTHER): Payer: Medicare Other | Admitting: Physician Assistant

## 2016-08-04 ENCOUNTER — Telehealth: Payer: Self-pay

## 2016-08-04 VITALS — BP 158/84 | HR 112 | Temp 99.9°F | Resp 18 | Wt 283.0 lb

## 2016-08-04 DIAGNOSIS — J449 Chronic obstructive pulmonary disease, unspecified: Secondary | ICD-10-CM

## 2016-08-04 DIAGNOSIS — E1149 Type 2 diabetes mellitus with other diabetic neurological complication: Secondary | ICD-10-CM

## 2016-08-04 DIAGNOSIS — Z930 Tracheostomy status: Secondary | ICD-10-CM

## 2016-08-04 DIAGNOSIS — J441 Chronic obstructive pulmonary disease with (acute) exacerbation: Secondary | ICD-10-CM | POA: Diagnosis not present

## 2016-08-04 DIAGNOSIS — E1142 Type 2 diabetes mellitus with diabetic polyneuropathy: Secondary | ICD-10-CM

## 2016-08-04 DIAGNOSIS — G894 Chronic pain syndrome: Secondary | ICD-10-CM | POA: Diagnosis not present

## 2016-08-04 MED ORDER — TIOTROPIUM BROMIDE MONOHYDRATE 18 MCG IN CAPS: ORAL_CAPSULE | RESPIRATORY_TRACT | 1 refills | Status: DC

## 2016-08-04 MED ORDER — GABAPENTIN 300 MG PO CAPS: 300.0000 mg | ORAL_CAPSULE | Freq: Three times a day (TID) | ORAL | 0 refills | Status: DC

## 2016-08-04 MED ORDER — FLUTICASONE-SALMETEROL 500-50 MCG/DOSE IN AEPB: INHALATION_SPRAY | RESPIRATORY_TRACT | 1 refills | Status: DC

## 2016-08-04 MED ORDER — DICLOFENAC SODIUM 75 MG PO TBEC: 75.0000 mg | DELAYED_RELEASE_TABLET | Freq: Two times a day (BID) | ORAL | 0 refills | Status: DC

## 2016-08-04 MED ORDER — ALBUTEROL SULFATE HFA 108 (90 BASE) MCG/ACT IN AERS: 2.0000 | INHALATION_SPRAY | Freq: Four times a day (QID) | RESPIRATORY_TRACT | 3 refills | Status: DC | PRN

## 2016-08-04 MED ORDER — ALBUTEROL SULFATE HFA 108 (90 BASE) MCG/ACT IN AERS: INHALATION_SPRAY | RESPIRATORY_TRACT | 0 refills | Status: DC

## 2016-08-04 MED ORDER — POLYETHYLENE GLYCOL 3350 17 GM/SCOOP PO POWD: ORAL | 4 refills | Status: DC

## 2016-08-04 NOTE — Telephone Encounter (Signed)
So strange I wrote for albuterol - I have changed to ventolin.

## 2016-08-04 NOTE — Patient Instructions (Addendum)
Gabapentin - to get back on it - start with a pill at night and then in 4 days increase to 1 pill in the am and 1 before bed and then in 3-4 days increase to 1 pill 3x/day  Pt has lost his pain medication and he would like something until he gets to his pain management visit - we will put him on diclofenac to hopefully give him something to help with the pain - getting him back on the gabapentin to help with his pain    IF you received an x-ray today, you will receive an invoice from [facility]. Please contact [facility] at [phone] with questions or concerns regarding your invoice.   IF you received labwork today, you will receive an invoice from [facility]. Please contact [facility] at [phone] with questions or concerns regarding your invoice.   Our billing staff will not be able to assist you with questions regarding bills from these companies.  You will be contacted with the lab results as soon as they are available. The fastest way to get your results is to activate your My Chart account. Instructions are located on the last page of this paperwork. If you have not heard from us regarding the results in 2 weeks, please contact this office.

## 2016-08-04 NOTE — Telephone Encounter (Signed)
Medication reordered at time of visit today

## 2016-08-04 NOTE — Telephone Encounter (Signed)
proventil not covered  Need to change to ventolin or xopenex

## 2016-08-04 NOTE — Progress Notes (Signed)
Tyrone Cunningham  MRN: [id number] DOB: 07-07-1955  PCP: Mancel Bale, PA-C  Chief Complaint  Patient presents with  . Medication Refill    WOULD LIKE TO DISCUSS MEDICATION     Subjective:  Pt presents to clinic for medication refill.  He has not had any of his medications since 11/17 - his PCP retired and he has been in denial and has had no medical care other than his pain management.  1- would like non -narcotic pain medications until he goes back to pain management - he misplaced his medications - 6/14 goes back to pain clinic - he has been out for about 10 days and he knows he cannot get any more due to his pain contract  He gets percocet 10/325 - 5 pills a day from pain clinic  Out of medications since November - he is not checking sugars and taking none medications - but since stopping his insulin he feels like he has lost a lot of weight - he thinks about 100 lb weight loss.  He has not been breathing as good - he has albuterol but he knows he breaths better with his spriva and advair.  He does not check his blood sugar at all.  Review of Systems  Respiratory: Positive for shortness of breath (normal for him).     Patient Active Problem List   Diagnosis Date Noted  . Chronic pain syndrome 04/18/2015  . Neuropathic pain 04/18/2015  . CAP (community acquired pneumonia) 03/21/2015  . COPD with exacerbation ([place]) 03/21/2015  . Constipation 01/07/2014  . Abdominal pain   . Constipation, chronic 01/04/2014  . Acute cholecystitis 01/04/2014  . Chronic respiratory failure ([place]) 01/06/2013  . Edema 03/16/2012  . Obesity 10/20/2011  . DM (diabetes mellitus), type 2 with neurological complications ([place]) 38/75/6433  . 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 OSA (obstructive sleep apnea) 10/16/2011  . [name] with frequent exacerbations   . CHF (congestive heart failure) ([place])   . Hypertension   . Hypercholesteremia   . Hearing loss   . Peripheral neuropathy     Current  Outpatient Prescriptions on File Prior to Visit  Medication Sig Dispense Refill  . oxyCODONE-acetaminophen (PERCOCET) 10-325 MG per tablet Take 1 tablet by mouth every 6 (six) hours as needed for pain (pain).     Marland Kitchen ACCU-CHEK FASTCLIX LANCETS MISC USE AS DIRECTED (Patient not taking: Reported on 08/04/2016) 9180 each 1  . ACCU-CHEK SMARTVIEW test strip USE TO CHECK BLOOD SUGAR UP TO FOUR TIMES DAILY (Patient not taking: Reported on 08/04/2016) 300 each 0  . albuterol (PROVENTIL) (2.5 MG/3ML) 0.083% nebulizer solution USE 1 VIAL IN NEBULIZER EVERY 6 HOURS. MUST BE SEEN IN CLINIC FOR REFILLS Generic: VENTOLIN (Patient not taking: Reported on 08/04/2016) 30 vial 0  . atorvastatin (LIPITOR) 10 MG tablet TAKE 1 TABLET(10 MG) BY MOUTH DAILY (Patient not taking: Reported on 08/04/2016) 90 tablet 0  . blood glucose meter kit and supplies KIT Dispense based on patient and insurance preference. Use up to four times daily as directed.E11.42 (Patient not taking: Reported on 08/04/2016) 1 each 0  . furosemide (LASIX) 40 MG tablet TAKE 1 TABLET BY MOUTH TWICE DAILY (Patient not taking: Reported on 08/04/2016) 180 tablet 1  . Insulin Syringe-Needle U-100 (INSULIN SYRINGE .3CC/31GX5/16") 31G X 5/16" 0.3 ML MISC USE AS DIRECTED (Patient not taking: Reported on 08/04/2016) 100 each 0  . LANTUS 100 UNIT/ML injection ADMINISTER 90 UNITS UNDER THE SKIN EVERY DAY AS DIRECTED (  Patient not taking: Reported on 08/04/2016) 30 mL 2  . lisinopril (PRINIVIL,ZESTRIL) 5 MG tablet TAKE 1 TABLET BY MOUTH DAILY (Patient not taking: Reported on 08/04/2016) 90 tablet 0  . NOVOLIN R 100 UNIT/ML injection ADMINISTER 21 UNITS UNDER THE SKIN FOUR TIMES DAILY BEFORE MEALS AS NEEDED PER SLIDING SCALE (Patient not taking: Reported on 08/04/2016) 30 mL 2  . spironolactone (ALDACTONE) 25 MG tablet TAKE 1 TABLET BY MOUTH EVERY DAY (Patient not taking: Reported on 08/04/2016) 90 tablet 0  . UNABLE TO FIND Nebulizer machine DX: J44.9, I50.9 (Patient not taking: Reported  on 08/04/2016) 1 Device 0  . UNABLE TO FIND Hand held bedside urinal. Dx code: R26.9 (Patient not taking: Reported on 08/04/2016) 1 Device 0  . VOLTAREN 1 % GEL Apply 2 g topically daily as needed (hand pain).      No current facility-administered medications on file prior to visit.     No Known Allergies  Pt patients past, family and social history were reviewed and updated.   Objective:  BP (!) 158/84   Pulse (!) 112   Temp 99.9 F (37.7 C) (Oral)   Resp 18   Wt 283 lb (128.4 kg)   SpO2 94%   BMI 43.03 kg/m   Physical Exam  Constitutional: He is oriented to person, place, and time and well-developed, well-nourished, and in no distress.  HENT:  Head: Normocephalic and atraumatic.  Right Ear: External ear normal.  Left Ear: External ear normal.  Eyes: Conjunctivae are normal.  Neck: Normal range of motion.  Cardiovascular: Normal rate, regular rhythm and normal heart sounds.   No murmur heard. Pulmonary/Chest: Effort normal and breath sounds normal. He has no wheezes.  Prolonged expiration  Neurological: He is alert and oriented to person, place, and time. Gait normal.  Skin: Skin is warm and dry.  Psychiatric: Mood, memory, affect and judgment normal.    Wt Readings from Last 3 Encounters:  08/04/16 283 lb (128.4 kg)  04/12/15 (!) 342 lb 12.8 oz (155.5 kg)  03/24/15 (!) 342 lb 14.4 oz (155.5 kg)    Assessment and Plan :  COPD exacerbation ([place]) - Plan: tiotropium (SPIRIVA HANDIHALER) 18 MCG inhalation capsule, Fluticasone-Salmeterol (ADVAIR DISKUS) 500-50 MCG/DOSE AEPB, albuterol (PROVENTIL HFA) 108 (90 Base) MCG/ACT inhaler  [name] with frequent exacerbations - restart medications  Morbid obesity due to excess calories ([place]) - some weight loss with no insulin  DM (diabetes mellitus), type 2 with neurological complications ([place]) - Plan: CMP14+EGFR, Hemoglobin A1c, Lipid panel - check labs and patient will see me in 2 weeks to restart medications  Tracheostomy  dependent ([place]) - pt needs to call ENT to have replaced  Chronic pain syndrome - Plan: polyethylene glycol powder (GLYCOLAX/MIRALAX) powder, gabapentin (NEURONTIN) 300 MG capsule, diclofenac (VOLTAREN) 75 MG EC tablet - will restart gabapentin to help with pain for DM neuropathy - and gave the patient diclofenac to help him bridge from lost medication to pain management visit - d/w pt that he needs to keep locked up and not carry with him due to they can not be replaced.  Diabetic peripheral neuropathy ([place]) likely worse due to recent uncontrolled DM  Elevated BP - pt is in pain due to loss of pain medications and chronic pain - will reasses at next visit.  Windell Hummingbird PA-C  [facility] 08/04/2016 2:44 PM

## 2016-08-05 ENCOUNTER — Telehealth: Payer: Self-pay

## 2016-08-05 LAB — LIPID PANEL
CHOLESTEROL TOTAL: 192 mg/dL (ref 100–199)
Chol/HDL Ratio: 5.6 ratio — ABNORMAL HIGH (ref 0.0–5.0)
HDL: 34 mg/dL — AB (ref >39)
LDL Calculated: 106 mg/dL — ABNORMAL HIGH (ref 0–99)
TRIGLYCERIDES: 262 mg/dL — AB (ref 0–149)
VLDL Cholesterol Cal: 52 mg/dL — ABNORMAL HIGH (ref 5–40)

## 2016-08-05 LAB — CMP14+EGFR
ALBUMIN: 3.4 g/dL — AB (ref 3.6–4.8)
ALK PHOS: 94 IU/L (ref 39–117)
ALT: 15 IU/L (ref 0–44)
AST: 14 IU/L (ref 0–40)
Albumin/Globulin Ratio: 1.1 — ABNORMAL LOW (ref 1.2–2.2)
BILIRUBIN TOTAL: 0.3 mg/dL (ref 0.0–1.2)
BUN / CREAT RATIO: 16 (ref 10–24)
BUN: 13 mg/dL (ref 8–27)
CHLORIDE: 92 mmol/L — AB (ref 96–106)
CO2: 26 mmol/L (ref 18–29)
Calcium: 9.1 mg/dL (ref 8.6–10.2)
Creatinine, Ser: 0.82 mg/dL (ref 0.76–1.27)
GFR calc Af Amer: 109 mL/min/{1.73_m2} (ref >59)
GFR calc non Af Amer: 94 mL/min/{1.73_m2} (ref >59)
GLUCOSE: 431 mg/dL — AB (ref 65–99)
Globulin, Total: 3.2 g/dL (ref 1.5–4.5)
Potassium: 4.7 mmol/L (ref 3.5–5.2)
SODIUM: 133 mmol/L — AB (ref 134–144)
Total Protein: 6.6 g/dL (ref 6.0–8.5)

## 2016-08-05 LAB — HEMOGLOBIN A1C
Est. average glucose Bld gHb Est-mCnc: 309 mg/dL
HEMOGLOBIN A1C: 12.4 % — AB (ref 4.8–5.6)

## 2016-08-05 MED ORDER — UMECLIDINIUM BROMIDE 62.5 MCG/INH IN AEPB: 1.0000 | INHALATION_SPRAY | Freq: Every day | RESPIRATORY_TRACT | 3 refills | Status: DC

## 2016-08-05 NOTE — Telephone Encounter (Signed)
Spiriva not covered, requesting INCRUSE ELPT INH 62.5 MCG preffered. Or prior auth at [phone] Pt ID #[id number]

## 2016-08-05 NOTE — Telephone Encounter (Signed)
Switched to what was recommended

## 2016-08-11 ENCOUNTER — Other Ambulatory Visit: Payer: Self-pay | Admitting: Physician Assistant

## 2016-08-11 MED ORDER — UMECLIDINIUM BROMIDE 62.5 MCG/INH IN AEPB: 1.0000 | INHALATION_SPRAY | Freq: Every day | RESPIRATORY_TRACT | 3 refills | Status: DC

## 2016-08-14 DIAGNOSIS — G5600 Carpal tunnel syndrome, unspecified upper limb: Secondary | ICD-10-CM | POA: Diagnosis not present

## 2016-08-14 DIAGNOSIS — M47816 Spondylosis without myelopathy or radiculopathy, lumbar region: Secondary | ICD-10-CM | POA: Diagnosis not present

## 2016-08-14 DIAGNOSIS — G894 Chronic pain syndrome: Secondary | ICD-10-CM | POA: Diagnosis not present

## 2016-08-14 DIAGNOSIS — M545 Low back pain: Secondary | ICD-10-CM | POA: Diagnosis not present

## 2016-08-14 DIAGNOSIS — M79643 Pain in unspecified hand: Secondary | ICD-10-CM | POA: Diagnosis not present

## 2016-08-19 ENCOUNTER — Encounter: Payer: Self-pay | Admitting: Physician Assistant

## 2016-08-19 ENCOUNTER — Ambulatory Visit (INDEPENDENT_AMBULATORY_CARE_PROVIDER_SITE_OTHER): Payer: Medicare Other | Admitting: Physician Assistant

## 2016-08-19 VITALS — BP 155/92 | HR 116 | Temp 99.8°F

## 2016-08-19 DIAGNOSIS — E1159 Type 2 diabetes mellitus with other circulatory complications: Secondary | ICD-10-CM

## 2016-08-19 DIAGNOSIS — G894 Chronic pain syndrome: Secondary | ICD-10-CM | POA: Diagnosis not present

## 2016-08-19 DIAGNOSIS — E7800 Pure hypercholesterolemia, unspecified: Secondary | ICD-10-CM | POA: Diagnosis not present

## 2016-08-19 DIAGNOSIS — E1165 Type 2 diabetes mellitus with hyperglycemia: Secondary | ICD-10-CM | POA: Diagnosis not present

## 2016-08-19 DIAGNOSIS — I739 Peripheral vascular disease, unspecified: Secondary | ICD-10-CM | POA: Diagnosis not present

## 2016-08-19 DIAGNOSIS — G4700 Insomnia, unspecified: Secondary | ICD-10-CM | POA: Diagnosis not present

## 2016-08-19 DIAGNOSIS — I10 Essential (primary) hypertension: Secondary | ICD-10-CM

## 2016-08-19 DIAGNOSIS — IMO0002 Reserved for concepts with insufficient information to code with codable children: Secondary | ICD-10-CM

## 2016-08-19 MED ORDER — METFORMIN HCL ER 500 MG PO TB24: 1000.0000 mg | ORAL_TABLET | Freq: Two times a day (BID) | ORAL | 0 refills | Status: DC

## 2016-08-19 MED ORDER — SITAGLIPTIN PHOSPHATE 100 MG PO TABS: 100.0000 mg | ORAL_TABLET | Freq: Every day | ORAL | 0 refills | Status: DC

## 2016-08-19 MED ORDER — ATORVASTATIN CALCIUM 20 MG PO TABS: 20.0000 mg | ORAL_TABLET | Freq: Every day | ORAL | 3 refills | Status: DC

## 2016-08-19 MED ORDER — AMITRIPTYLINE HCL 25 MG PO TABS: 25.0000 mg | ORAL_TABLET | Freq: Every day | ORAL | 0 refills | Status: DC

## 2016-08-19 MED ORDER — LISINOPRIL 10 MG PO TABS: 10.0000 mg | ORAL_TABLET | Freq: Every day | ORAL | 3 refills | Status: DC

## 2016-08-19 MED ORDER — GABAPENTIN 300 MG PO CAPS: 300.0000 mg | ORAL_CAPSULE | Freq: Three times a day (TID) | ORAL | 0 refills | Status: DC

## 2016-08-19 NOTE — Progress Notes (Signed)
Tyrone Cunningham  MRN: [id number] DOB: 01/02/1956  PCP: Mancel Bale, PA-C  Chief Complaint  Patient presents with  . Diabetes    6 mth f/u    Subjective:  Pt presents to clinic for f/u on medication problems.  He has restarted all his COPD medications and thinks he might feel a little better.  He has gotten back on his pain medications from the pain clinic and is keeping better track of them.  He feels like restarting the gabapentin has helped with his pain.  He has not been checking his sugars and he is not sure that he is going to restart doing that.  He is not really interested in restarting his insulin because he does not like giving himself injections.  He is fine with oral medications.  He has been on something for sleep in the past but he cannot remember what it was - he is not sleeping well and he would like something - upon review of his chart it looks like he has been on Elavil in the past but he is not sure if it helped.    Review of Systems  Respiratory: Positive for shortness of breath (not changed for him). Negative for cough.   Musculoskeletal: Positive for gait problem (in auto wheel chair).  Skin: Positive for color change (peripheral vascular changes - dusky skin, edema bilaterall).    Patient Active Problem List   Diagnosis Date Noted  . Chronic pain syndrome 04/18/2015  . Neuropathic pain 04/18/2015  . CAP (community acquired pneumonia) 03/21/2015  . COPD with exacerbation ([place]) 03/21/2015  . Constipation 01/07/2014  . Abdominal pain   . Constipation, chronic 01/04/2014  . Acute cholecystitis 01/04/2014  . Chronic respiratory failure ([place]) 01/06/2013  . Edema 03/16/2012  . Obesity 10/20/2011  . DM (diabetes mellitus), type 2 with neurological complications ([place]) 63/84/6659  . 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 OSA (obstructive sleep apnea) 10/16/2011  . [name] with frequent exacerbations   . CHF (congestive heart failure) ([place])   .  Hypertension   . Hypercholesteremia   . Hearing loss   . Peripheral neuropathy     Current Outpatient Prescriptions on File Prior to Visit  Medication Sig Dispense Refill  . ACCU-CHEK FASTCLIX LANCETS MISC USE AS DIRECTED (Patient not taking: Reported on 08/04/2016) 9180 each 1  . ACCU-CHEK SMARTVIEW test strip USE TO CHECK BLOOD SUGAR UP TO FOUR TIMES DAILY (Patient not taking: Reported on 08/04/2016) 300 each 0  . albuterol (PROVENTIL HFA;VENTOLIN HFA) 108 (90 Base) MCG/ACT inhaler Inhale 2 puffs into the lungs every 6 (six) hours as needed for wheezing or shortness of breath. 3 Inhaler 3  . albuterol (PROVENTIL) (2.5 MG/3ML) 0.083% nebulizer solution USE 1 VIAL IN NEBULIZER EVERY 6 HOURS. MUST BE SEEN IN CLINIC FOR REFILLS Generic: VENTOLIN (Patient not taking: Reported on 08/04/2016) 30 vial 0  . blood glucose meter kit and supplies KIT Dispense based on patient and insurance preference. Use up to four times daily as directed.E11.42 (Patient not taking: Reported on 08/04/2016) 1 each 0  . Fluticasone-Salmeterol (ADVAIR DISKUS) 500-50 MCG/DOSE AEPB INHALE 1 PUFF BY MOUTH INTO THE LUNGS TWICE DAILY 180 each 1  . furosemide (LASIX) 40 MG tablet TAKE 1 TABLET BY MOUTH TWICE DAILY (Patient not taking: Reported on 08/04/2016) 180 tablet 1  . Insulin Syringe-Needle U-100 (INSULIN SYRINGE .3CC/31GX5/16") 31G X 5/16" 0.3 ML MISC USE AS DIRECTED (Patient not taking: Reported on 08/04/2016) 100 each 0  .  LANTUS 100 UNIT/ML injection ADMINISTER 90 UNITS UNDER THE SKIN EVERY DAY AS DIRECTED (Patient not taking: Reported on 08/04/2016) 30 mL 2  . NOVOLIN R 100 UNIT/ML injection ADMINISTER 21 UNITS UNDER THE SKIN FOUR TIMES DAILY BEFORE MEALS AS NEEDED PER SLIDING SCALE (Patient not taking: Reported on 08/04/2016) 30 mL 2  . oxyCODONE-acetaminophen (PERCOCET) 10-325 MG per tablet Take 1 tablet by mouth every 6 (six) hours as needed for pain (pain).     . polyethylene glycol powder (GLYCOLAX/MIRALAX) powder MIX ONE CAPFFUL  IN LIQUID OF CHOICE AND DRINK DAILY AS DIRECTED 527 g 4  . spironolactone (ALDACTONE) 25 MG tablet TAKE 1 TABLET BY MOUTH EVERY DAY (Patient not taking: Reported on 08/04/2016) 90 tablet 0  . umeclidinium bromide (INCRUSE ELLIPTA) 62.5 MCG/INH AEPB Inhale 1 puff into the lungs daily. 30 each 3  . umeclidinium bromide (INCRUSE ELLIPTA) 62.5 MCG/INH AEPB Inhale 1 puff into the lungs daily. 30 each 3  . UNABLE TO FIND Nebulizer machine DX: J44.9, I50.9 (Patient not taking: Reported on 08/04/2016) 1 Device 0  . UNABLE TO FIND Hand held bedside urinal. Dx code: R26.9 (Patient not taking: Reported on 08/04/2016) 1 Device 0  . VOLTAREN 1 % GEL Apply 2 g topically daily as needed (hand pain).      No current facility-administered medications on file prior to visit.     No Known Allergies  Pt patients past, family and social history were reviewed and updated.   Objective:  BP (!) 155/92 (BP Location: Right Arm, Patient Position: Sitting, Cuff Size: Large)   Pulse (!) 116   Temp 99.8 F (37.7 C) (Oral)   Physical Exam  Constitutional: He is oriented to person, place, and time and well-developed, well-nourished, and in no distress.  HENT:  Head: Normocephalic and atraumatic.  Right Ear: External ear normal.  Left Ear: External ear normal.  Eyes: Conjunctivae are normal.  Neck: Normal range of motion.  Cardiovascular: Normal rate, regular rhythm, normal heart sounds and intact distal pulses.   Pulmonary/Chest: Effort normal and breath sounds normal. He has no wheezes.  Musculoskeletal:       Right lower leg: He exhibits edema (2+ pitting edema).       Left lower leg: He exhibits edema (2+ pitting edema).  Neurological: He is alert and oriented to person, place, and time. Gait normal.  Skin: Skin is warm and dry.  Diabetic Foot Exam - Simple   Simple Foot Form Visual Inspection See comments:  Yes Sensation Testing See comments:  Yes Pulse Check See comments:  Yes Comments Pt seem to have   nail fungus of the toes. Pt seem to not have sensation of  the second to nor pinky toe on the right foot. Pulse on both feet was  faint seem to be some edema /swollen of the feet - no lesions on the feet  Psychiatric: Mood, memory, affect and judgment normal.    Assessment and Plan :  Insomnia, unspecified type - Plan: amitriptyline (ELAVIL) 25 MG tablet - trial to help with sleep and might get some pain control at the same time - we can increase the dose if needed  Chronic pain syndrome - Plan: gabapentin (NEURONTIN) 300 MG capsule - continue this medication and pain management  Uncontrolled type 2 diabetes mellitus with other circulatory complication, without long-term current use of insulin (HCC) - Plan: HM DIABETES FOOT EXAM, metFORMIN (GLUCOPHAGE XR) 500 MG 24 hr tablet, sitaGLIPtin (JANUVIA) 100 MG tablet - start oral  agents - pt is not sure he wants to be on insulin - we discussed the risks and benefits of being on and off insulin - he would like to try oral agents 1st - he does not want to check his sugars  Elevated cholesterol - Plan: atorvastatin (LIPITOR) 20 MG tablet - restart medications  Essential hypertension - Plan: lisinopril (PRINIVIL,ZESTRIL) 10 MG tablet - restart medications  Recheck in 8 weeks  Windell Hummingbird PA-C  [facility] 08/19/2016 3:09 PM

## 2016-08-19 NOTE — Patient Instructions (Addendum)
For diabetes For metformin (glucophage) start with a pill 2x/day for a week and then increase to 2 pills 2x/day  Januvia - this is also for your diabetes  For Blood pressure Start lisinopril   For cholesterol Start lipitor

## 2016-08-21 ENCOUNTER — Telehealth: Payer: Self-pay | Admitting: Physician Assistant

## 2016-08-21 NOTE — Telephone Encounter (Signed)
Pt would like you to call [facility] to give the ok to his power chair repair.

## 2016-08-21 NOTE — Telephone Encounter (Signed)
Ok to call and give verbal for wheel chair repair - if I need to write a letter for an order please write and I will sign.

## 2016-08-21 NOTE — Telephone Encounter (Signed)
Please advise 

## 2016-09-05 ENCOUNTER — Other Ambulatory Visit: Payer: Self-pay | Admitting: Emergency Medicine

## 2016-09-05 DIAGNOSIS — J441 Chronic obstructive pulmonary disease with (acute) exacerbation: Secondary | ICD-10-CM

## 2016-09-05 DIAGNOSIS — R0602 Shortness of breath: Secondary | ICD-10-CM

## 2016-09-05 MED ORDER — ALBUTEROL SULFATE (2.5 MG/3ML) 0.083% IN NEBU: 2.5000 mg | INHALATION_SOLUTION | Freq: Four times a day (QID) | RESPIRATORY_TRACT | 2 refills | Status: DC | PRN

## 2016-09-08 ENCOUNTER — Other Ambulatory Visit: Payer: Self-pay | Admitting: Physician Assistant

## 2016-09-08 DIAGNOSIS — G894 Chronic pain syndrome: Secondary | ICD-10-CM

## 2016-09-08 NOTE — Telephone Encounter (Signed)
PT NEED A REFILL ON DICLOFENAC/VOLTAREN PLEASE CALL PT

## 2016-09-09 ENCOUNTER — Telehealth: Payer: Self-pay | Admitting: Physician Assistant

## 2016-09-09 NOTE — Telephone Encounter (Signed)
Placed a call to pt unable to reach. There is no voicemail to leave message.

## 2016-09-09 NOTE — Telephone Encounter (Signed)
PATIENT SAID HE WILL BE OUT OF HIS ALBUTEROL ON Thursday (09/11/16). HE SAID THE PHARMACY SAID HE CAN NOT GET A REFILL UNTIL 09/24/2016. HE WILL USE 3 VIALS PER DAY INSTEAD OF 4 TO MAKE THEM LAST A LITTLE LONGER. BEST PHONE [phone] THEN DIAL 147. (CALL THIS NUMBER IF IT IS BEFORE Friday) ON Friday CALL [phone] (HIS CELL PHONE IS NOT WORKING RIGHT NOW) PHARMACY CHOICE IS [facility] ON [address]. MBC

## 2016-09-10 NOTE — Telephone Encounter (Signed)
Please advise....says it was discontinued by you and I don't see anything in your last office visit

## 2016-09-11 NOTE — Telephone Encounter (Signed)
This was to cover when he lost his narcotic Rx until he could get to pain management.

## 2016-09-11 NOTE — Telephone Encounter (Signed)
Unable to reach patient or leave message.

## 2016-09-11 NOTE — Telephone Encounter (Signed)
Unable to reach letter sent

## 2016-09-12 NOTE — Telephone Encounter (Signed)
Tyrone Cunningham PT ONLY HAS FOUR VITALS OF ALBUTEROL HE HAS AN APPOINTMENT TO SEE [facility] TOMORROW FOR MORE REFILLS PT STATES THAT  HE NEED REFILL ON PAIN MEDICINE I ADVISED PT THAT HE CAN NOT GET ANY PAIN MEDICINES FROM [facility] HE HAS TO SEE [facility] FOR THAT HE CAN ONLY GET HIS REFILL ON ALBUTEROL PT UNDERSTANDS PT PHONE IS NOT ON

## 2016-09-13 ENCOUNTER — Other Ambulatory Visit: Payer: Self-pay | Admitting: Emergency Medicine

## 2016-09-13 ENCOUNTER — Ambulatory Visit (INDEPENDENT_AMBULATORY_CARE_PROVIDER_SITE_OTHER): Payer: Medicare Other | Admitting: Emergency Medicine

## 2016-09-13 ENCOUNTER — Encounter: Payer: Self-pay | Admitting: Emergency Medicine

## 2016-09-13 VITALS — BP 155/82 | HR 86 | Temp 99.4°F | Resp 16

## 2016-09-13 DIAGNOSIS — R0602 Shortness of breath: Secondary | ICD-10-CM | POA: Insufficient documentation

## 2016-09-13 DIAGNOSIS — G894 Chronic pain syndrome: Secondary | ICD-10-CM | POA: Diagnosis not present

## 2016-09-13 DIAGNOSIS — J449 Chronic obstructive pulmonary disease, unspecified: Secondary | ICD-10-CM

## 2016-09-13 DIAGNOSIS — J441 Chronic obstructive pulmonary disease with (acute) exacerbation: Secondary | ICD-10-CM

## 2016-09-13 MED ORDER — DICLOFENAC SODIUM 75 MG PO TBEC
75.0000 mg | DELAYED_RELEASE_TABLET | Freq: Two times a day (BID) | ORAL | 0 refills | Status: AC
Start: 2016-09-13 — End: 2016-09-18

## 2016-09-13 MED ORDER — KETOROLAC TROMETHAMINE 60 MG/2ML IM SOLN
60.0000 mg | Freq: Once | INTRAMUSCULAR | Status: AC
  Administered 2016-09-13: 60 mg via INTRAMUSCULAR

## 2016-09-13 MED ORDER — ALBUTEROL SULFATE (2.5 MG/3ML) 0.083% IN NEBU: 2.5000 mg | INHALATION_SOLUTION | Freq: Four times a day (QID) | RESPIRATORY_TRACT | 2 refills | Status: DC | PRN

## 2016-09-13 MED ORDER — ALBUTEROL SULFATE (2.5 MG/3ML) 0.083% IN NEBU
2.5000 mg | INHALATION_SOLUTION | Freq: Once | RESPIRATORY_TRACT | Status: AC
  Administered 2016-09-13: 2.5 mg via RESPIRATORY_TRACT

## 2016-09-13 MED ORDER — IPRATROPIUM BROMIDE 0.02 % IN SOLN
0.5000 mg | Freq: Once | RESPIRATORY_TRACT | Status: AC
  Administered 2016-09-13: 0.5 mg via RESPIRATORY_TRACT

## 2016-09-13 NOTE — Progress Notes (Signed)
Tyrone Cunningham 61 y.o.   Chief Complaint  Patient presents with  . COPD    refill medication one time a month     HISTORY OF PRESENT ILLNESS: This is a 61 y.o. male with COPD Hx, needs refill of nebulizer solution and requesting pain medication.  HPI   Prior to Admission medications   Medication Sig Start Date End Date Taking? Authorizing Provider  albuterol (PROVENTIL HFA;VENTOLIN HFA) 108 (90 Base) MCG/ACT inhaler Inhale 2 puffs into the lungs every 6 (six) hours as needed for wheezing or shortness of breath. 08/04/16  Yes Weber, [name], PA-C  albuterol (PROVENTIL) (2.5 MG/3ML) 0.083% nebulizer solution Take 3 mLs (2.5 mg total) by nebulization every 6 (six) hours as needed for wheezing or shortness of breath. 09/05/16  Yes Weber, [name], PA-C  amitriptyline (ELAVIL) 25 MG tablet Take 1 tablet (25 mg total) by mouth at bedtime. 08/19/16  Yes Weber, [name], PA-C  atorvastatin (LIPITOR) 20 MG tablet Take 1 tablet (20 mg total) by mouth daily. 08/19/16  Yes Weber, Dema Severin, PA-C  Fluticasone-Salmeterol (ADVAIR DISKUS) 500-50 MCG/DOSE AEPB INHALE 1 PUFF BY MOUTH INTO THE LUNGS TWICE DAILY 08/04/16  Yes Weber, [name], PA-C  gabapentin (NEURONTIN) 300 MG capsule Take 1 capsule (300 mg total) by mouth 3 (three) times daily. 08/19/16  Yes Weber, [name], PA-C  lisinopril (PRINIVIL,ZESTRIL) 10 MG tablet Take 1 tablet (10 mg total) by mouth daily. 08/19/16  Yes Weber, Dema Severin, PA-C  metFORMIN (GLUCOPHAGE XR) 500 MG 24 hr tablet Take 2 tablets (1,000 mg total) by mouth 2 (two) times daily. 08/19/16  Yes Weber, Dema Severin, PA-C  oxyCODONE-acetaminophen (PERCOCET) 10-325 MG per tablet Take 1 tablet by mouth every 6 (six) hours as needed for pain (pain).    Yes [provider]  polyethylene glycol powder (GLYCOLAX/MIRALAX) powder MIX ONE CAPFFUL IN LIQUID OF CHOICE AND DRINK DAILY AS DIRECTED 08/04/16  Yes Weber, [name], PA-C  sitaGLIPtin (JANUVIA) 100 MG tablet Take 1 tablet (100 mg total) by mouth daily.  08/19/16  Yes Weber, [name], PA-C  umeclidinium bromide (INCRUSE ELLIPTA) 62.5 MCG/INH AEPB Inhale 1 puff into the lungs daily. 08/05/16  Yes Weber, [name], PA-C  umeclidinium bromide (INCRUSE ELLIPTA) 62.5 MCG/INH AEPB Inhale 1 puff into the lungs daily. 08/11/16  Yes Weber, [name], PA-C  VOLTAREN 1 % GEL Apply 2 g topically daily as needed (hand pain).    Yes [provider]    No Known Allergies  Patient Active Problem List   Diagnosis Date Noted  . Peripheral vascular disease (HCC) 08/19/2016  . Chronic pain syndrome 04/18/2015  . Neuropathic pain 04/18/2015  . CAP (community acquired pneumonia) 03/21/2015  . COPD with exacerbation (HCC) 03/21/2015  . Constipation 01/07/2014  . Abdominal pain   . Constipation, chronic 01/04/2014  . Acute cholecystitis 01/04/2014  . Chronic respiratory failure (HCC) 01/06/2013  . Edema 03/16/2012  . Obesity 10/20/2011  . 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 OSA (obstructive sleep apnea) 10/16/2011  . [name] with frequent exacerbations   . CHF (congestive heart failure) (HCC)   . Hypertension   . Hypercholesteremia   . Hearing loss   . Peripheral neuropathy     Past Medical History:  Diagnosis Date  . Bronchitis   . CHF (congestive heart failure) (HCC)   . COPD (chronic obstructive pulmonary disease) (HCC)   . DM type  2 with diabetic peripheral neuropathy (HCC)   . Hearing loss   . Hypercholesteremia   . Hypertension   . Peripheral neuropathy   . Sleep apnea    Severe sleep apnea requiring tracheostomy    Past Surgical History:  Procedure Laterality Date  . APPENDECTOMY    . CHOLECYSTECTOMY N/A 01/05/2014   Procedure: LAPAROSCOPIC CHOLECYSTECTOMY;  Surgeon: Emelia Loron, MD;  Location: [facility];  Service: General;  Laterality: N/A;  . FEMUR HARDWARE REMOVAL  09/01/2003   Removal of retained hardware, two interlocking distal  femoral  . FEMUR SURGERY    . HAND SURGERY    . MULTIPLE EXTRACTIONS WITH ALVEOLOPLASTY N/A 05/11/2012   Procedure: MULTIPLE EXTRACTION WITH ALVEOLOPLASTY;  Surgeon: [name], DDS;  Location: [facility] OR;  Service: Oral Surgery;  Laterality: N/A;  . TRACHEOSTOMY      Social History   Social History  . Marital status: Single    Spouse name: N/A  . Number of children: N/A  . Years of education: N/A   Occupational History  . Not on file.   Social History Main Topics  . Smoking status: Former Smoker    Packs/day: 2.00    Years: 30.00    Types: Cigarettes    Quit date: 07/02/1999  . Smokeless tobacco: Never Used  . Alcohol use No  . Drug use: No  . Sexual activity: No   Other Topics Concern  . Not on file   Social History Narrative  . No narrative on file    Family History  Problem Relation Age of Onset  . Coronary artery disease Father      Review of Systems  Constitutional: Negative.  Negative for chills and fever.  HENT: Negative for congestion, nosebleeds and sore throat.   Respiratory: Positive for shortness of breath and wheezing. Negative for cough.   Cardiovascular: Negative for chest pain.  Gastrointestinal: Negative for abdominal pain, nausea and vomiting.  Skin: Negative for rash.  Neurological: Negative for dizziness and headaches.  Endo/Heme/Allergies: Negative.   All other systems reviewed and are negative.   Vitals:   09/13/16 1218  BP: (!) 155/82  Pulse: 86  Resp: 16  Temp: 99.4 F (37.4 C)   O2sat: 95%  Physical Exam  Constitutional: He is oriented to person, place, and time. He appears well-developed and well-nourished.  Wheelchair bound.  HENT:  Head: Normocephalic and atraumatic.  Eyes: Pupils are equal, round, and reactive to light. EOM are normal.  Neck: Normal range of motion.  Tracheostomy tube in place.  Cardiovascular: Normal rate and regular rhythm.   Pulmonary/Chest: Effort normal. He has wheezes (mild and scattered).   Abdominal: Soft. There is no tenderness.  Neurological: He is alert and oriented to person, place, and time. No sensory deficit. He exhibits normal muscle tone.  Skin: Skin is warm and dry. Capillary refill takes less than 2 seconds.  Psychiatric: He has a normal mood and affect. His behavior is normal.  Vitals reviewed.    ASSESSMENT & PLAN: Larone was seen today for copd.  Diagnoses and all orders for this visit:  Chronic obstructive pulmonary disease, unspecified COPD type (HCC) -     albuterol (PROVENTIL) (2.5 MG/3ML) 0.083% nebulizer solution 2.5 mg; Take 3 mLs (2.5 mg total) by nebulization once. -     ipratropium (ATROVENT) nebulizer solution 0.5 mg; Take 2.5 mLs (0.5 mg total) by nebulization once.  Chronic pain syndrome -     ketorolac (TORADOL) injection 60 mg; Inject 2 mLs (  60 mg total) into the muscle once.  COPD exacerbation (HCC) -     albuterol (PROVENTIL) (2.5 MG/3ML) 0.083% nebulizer solution; Take 3 mLs (2.5 mg total) by nebulization every 6 (six) hours as needed for wheezing or shortness of breath.  SOB (shortness of breath) -     albuterol (PROVENTIL) (2.5 MG/3ML) 0.083% nebulizer solution; Take 3 mLs (2.5 mg total) by nebulization every 6 (six) hours as needed for wheezing or shortness of breath.  Other orders -     diclofenac (VOLTAREN) 75 MG EC tablet; Take 1 tablet (75 mg total) by mouth 2 (two) times daily.     Edwina BarthMiguel Pacey Altizer, MD [facility]

## 2016-09-19 ENCOUNTER — Telehealth: Payer: Self-pay | Admitting: *Deleted

## 2016-09-19 NOTE — Telephone Encounter (Signed)
Faxed signed order for Albuterol 0.083% to [facility] toll free number. Confirmation page received [phone].

## 2016-09-24 DIAGNOSIS — M47817 Spondylosis without myelopathy or radiculopathy, lumbosacral region: Secondary | ICD-10-CM | POA: Diagnosis not present

## 2016-09-24 DIAGNOSIS — Z79891 Long term (current) use of opiate analgesic: Secondary | ICD-10-CM | POA: Diagnosis not present

## 2016-09-24 DIAGNOSIS — G894 Chronic pain syndrome: Secondary | ICD-10-CM | POA: Diagnosis not present

## 2016-09-24 DIAGNOSIS — Z79899 Other long term (current) drug therapy: Secondary | ICD-10-CM | POA: Diagnosis not present

## 2016-10-03 ENCOUNTER — Other Ambulatory Visit: Payer: Self-pay | Admitting: Emergency Medicine

## 2016-10-14 ENCOUNTER — Ambulatory Visit (INDEPENDENT_AMBULATORY_CARE_PROVIDER_SITE_OTHER): Payer: Medicare Other | Admitting: Physician Assistant

## 2016-10-14 ENCOUNTER — Encounter: Payer: Self-pay | Admitting: Physician Assistant

## 2016-10-14 VITALS — BP 115/82 | HR 109 | Temp 99.8°F | Resp 18 | Ht 68.0 in | Wt 263.0 lb

## 2016-10-14 DIAGNOSIS — Z598 Other problems related to housing and economic circumstances: Secondary | ICD-10-CM | POA: Diagnosis not present

## 2016-10-14 DIAGNOSIS — Z599 Problem related to housing and economic circumstances, unspecified: Secondary | ICD-10-CM

## 2016-10-14 DIAGNOSIS — J441 Chronic obstructive pulmonary disease with (acute) exacerbation: Secondary | ICD-10-CM

## 2016-10-14 DIAGNOSIS — E1149 Type 2 diabetes mellitus with other diabetic neurological complication: Secondary | ICD-10-CM | POA: Diagnosis not present

## 2016-10-14 DIAGNOSIS — Z9119 Patient's noncompliance with other medical treatment and regimen: Secondary | ICD-10-CM | POA: Diagnosis not present

## 2016-10-14 DIAGNOSIS — Z91199 Patient's noncompliance with other medical treatment and regimen due to unspecified reason: Secondary | ICD-10-CM

## 2016-10-14 DIAGNOSIS — Z87891 Personal history of nicotine dependence: Secondary | ICD-10-CM

## 2016-10-14 DIAGNOSIS — R634 Abnormal weight loss: Secondary | ICD-10-CM

## 2016-10-14 DIAGNOSIS — G894 Chronic pain syndrome: Secondary | ICD-10-CM | POA: Diagnosis not present

## 2016-10-14 DIAGNOSIS — Z930 Tracheostomy status: Secondary | ICD-10-CM | POA: Diagnosis not present

## 2016-10-14 DIAGNOSIS — I10 Essential (primary) hypertension: Secondary | ICD-10-CM | POA: Diagnosis not present

## 2016-10-14 MED ORDER — GABAPENTIN 400 MG PO CAPS: 400.0000 mg | ORAL_CAPSULE | Freq: Three times a day (TID) | ORAL | 0 refills | Status: DC

## 2016-10-14 NOTE — Progress Notes (Signed)
Tyrone Cunningham  MRN: [id number] DOB: May 12, 1955  PCP: Morrell Riddle, PA-C  Chief Complaint  Patient presents with  . Follow-up    from 08/19/16    Subjective:  Pt presents to clinic for general check-up.  DM -  Not going to use insulin - he feels like it made him fat and since stopping it he has lost weight - he understands the possible effects of uncontrolled DM but he is not using insulin - he never picked up the additional medications that were Rx last month - he many months does not have money to pay for him medications so he has to pick and chose which ones he wants to have for the month.  He still eats honey buns but trying to eat a little better - still losing weight - does not always eat regularly due to funds - he tries to go to places that offer meals during the day esp for lunch. Does not check his glucose at home and does not plan to.  Polyneuropathy - still hurts - diclofenac helps and ultram helped that he got at last visit - he still sees pain management and he would like more pain medications for the burning pain - d/w pt that as long as his DM stays out of control this will likely get worse  He has a flat tire on his wheelchair - plans to call and get it fixed  Financial stressors - trouble getting food due to money  - gets food stamps $30/month $999 /paycheck but rent is $700 a month - then has to pay for medications Cannot always afford his medications  HTN - not been on medications since November - does not check his BP   History is obtained by patient.  Review of Systems  Constitutional: Negative for chills and fever.  Eyes: Negative for visual disturbance.  Respiratory: Negative for cough and shortness of breath.   Cardiovascular: Negative for chest pain, palpitations and leg swelling.  Neurological: Negative for dizziness, light-headedness, numbness and headaches.  Psychiatric/Behavioral: Negative for sleep disturbance.    Patient Active Problem List   Diagnosis Date Noted  . SOB (shortness of breath) 09/13/2016  . Peripheral vascular disease (HCC) 08/19/2016  . Chronic pain syndrome 04/18/2015  . Neuropathic pain 04/18/2015  . CAP (community acquired pneumonia) 03/21/2015  . COPD exacerbation (HCC) 03/21/2015  . Constipation 01/07/2014  . Abdominal pain   . Constipation, chronic 01/04/2014  . Acute cholecystitis 01/04/2014  . Chronic respiratory failure (HCC) 01/06/2013  . Edema 03/16/2012  . Obesity 10/20/2011  . 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 OSA (obstructive sleep apnea) 10/16/2011  . [name] with frequent exacerbations   . CHF (congestive heart failure) (HCC)   . Hypertension   . Hypercholesteremia   . Hearing loss   . Peripheral neuropathy     Current Outpatient Prescriptions on File Prior to Visit  Medication Sig Dispense Refill  . albuterol (PROVENTIL HFA;VENTOLIN HFA) 108 (90 Base) MCG/ACT inhaler Inhale 2 puffs into the lungs every 6 (six) hours as needed for wheezing or shortness of breath. 3 Inhaler 3  . albuterol (PROVENTIL) (2.5 MG/3ML) 0.083% nebulizer solution Take 3 mLs (2.5 mg total) by nebulization every 6 (six) hours as needed for wheezing or shortness of breath. 30 vial 2  . amitriptyline (ELAVIL) 25 MG tablet Take 1 tablet (25 mg total) by mouth at bedtime. 90 tablet 0  .  atorvastatin (LIPITOR) 20 MG tablet Take 1 tablet (20 mg total) by mouth daily. 90 tablet 3  . Fluticasone-Salmeterol (ADVAIR DISKUS) 500-50 MCG/DOSE AEPB INHALE 1 PUFF BY MOUTH INTO THE LUNGS TWICE DAILY 180 each 1  . metFORMIN (GLUCOPHAGE XR) 500 MG 24 hr tablet Take 2 tablets (1,000 mg total) by mouth 2 (two) times daily. 360 tablet 0  . oxyCODONE-acetaminophen (PERCOCET) 10-325 MG per tablet Take 1 tablet by mouth every 6 (six) hours as needed for pain (pain).     . polyethylene glycol powder (GLYCOLAX/MIRALAX) powder MIX ONE CAPFFUL IN  LIQUID OF CHOICE AND DRINK DAILY AS DIRECTED 527 g 4  . sitaGLIPtin (JANUVIA) 100 MG tablet Take 1 tablet (100 mg total) by mouth daily. 90 tablet 0  . umeclidinium bromide (INCRUSE ELLIPTA) 62.5 MCG/INH AEPB Inhale 1 puff into the lungs daily. 30 each 3  . umeclidinium bromide (INCRUSE ELLIPTA) 62.5 MCG/INH AEPB Inhale 1 puff into the lungs daily. 30 each 3  . VOLTAREN 1 % GEL Apply 2 g topically daily as needed (hand pain).      No current facility-administered medications on file prior to visit.     No Known Allergies  Past Medical History:  Diagnosis Date  . Bronchitis   . CHF (congestive heart failure) (HCC)   . COPD (chronic obstructive pulmonary disease) (HCC)   . DM type 2 with diabetic peripheral neuropathy (HCC)   . Hearing loss   . Hypercholesteremia   . Hypertension   . Peripheral neuropathy   . Sleep apnea    Severe sleep apnea requiring tracheostomy   Social History   Social History Narrative  . No narrative on file   Social History  Substance Use Topics  . Smoking status: Former Smoker    Packs/day: 2.00    Years: 30.00    Types: Cigarettes    Quit date: 07/02/1999  . Smokeless tobacco: Never Used  . Alcohol use No   family history includes Coronary artery disease in his father.     Objective:  BP 115/82   Pulse (!) 109   Temp 99.8 F (37.7 C) (Oral)   Resp 18   Ht 5\' 8"  (1.727 m)   Wt 263 lb (119.3 kg)   SpO2 92%   BMI 39.99 kg/m  Body mass index is 39.99 kg/m.  Physical Exam  Constitutional: He is oriented to person, place, and time and well-developed, well-nourished, and in no distress.  Talks through trach  HENT:  Head: Normocephalic and atraumatic.  Right Ear: External ear normal.  Left Ear: External ear normal.  Eyes: Conjunctivae are normal.  Neck: Normal range of motion.  Cardiovascular: Normal rate, regular rhythm and normal heart sounds.   No murmur heard. Pulmonary/Chest: Effort normal and breath sounds normal. He has no  wheezes.  Neurological: He is alert and oriented to person, place, and time.  In wheelchair  Skin: Skin is warm and dry.  Psychiatric: Mood, memory, affect and judgment normal.    Assessment and Plan :  Essential hypertension - current controlled even without medications - will continue to monitor - weight loss may help - pt would benefit from ACE for kidney protection but he is not interested today - we will continue to encourage him to use as his current kidney function is good  COPD exacerbation (HCC) - Plan: Ambulatory referral to [facility], Ambulatory referral to Pulmonology - pt needs COPD care from a pulmonologist  DM (diabetes mellitus), type 2 with neurological complications (  HCC) - Plan: Ambulatory referral to [facility] - pt is not interested in treating this and his medications are not always affordable to him  Loss of weight - Plan: Ambulatory Referral for Lung Cancer Scre - ? Related to lack of eating or perhaps cancer - pt has COPD so I am not sure he is a candidate from the [facility] program but will try a referral  Financial difficulties - Plan: Ambulatory referral to [facility]  Former smoker - Plan: Ambulatory Referral for Lung Cancer Scre  Tracheostomy dependent (HCC) - Plan: Ambulatory referral to Pulmonology  Chronic pain syndrome - Plan: gabapentin (NEURONTIN) 400 MG capsule - d/w pt that he has a pain contract with pain center and that we can increase his gabapentin and that will likely help with the neuropathy pain that he is having.  Non-compliant patient - some due to choice and other due to circumstance  Benny Lennert PA-C  [facility] at [facility] 10/20/2016 12:12 PM

## 2016-10-14 NOTE — Patient Instructions (Signed)
     IF you received an x-ray today, you will receive an invoice from [facility]. Please contact [facility] at [phone] with questions or concerns regarding your invoice.   IF you received labwork today, you will receive an invoice from [facility]. Please contact [facility] at [phone] with questions or concerns regarding your invoice.   Our billing staff will not be able to assist you with questions regarding bills from these companies.  You will be contacted with the lab results as soon as they are available. The fastest way to get your results is to activate your My Chart account. Instructions are located on the last page of this paperwork. If you have not heard from us regarding the results in 2 weeks, please contact this office.     

## 2016-10-27 DIAGNOSIS — Z79891 Long term (current) use of opiate analgesic: Secondary | ICD-10-CM | POA: Diagnosis not present

## 2016-10-27 DIAGNOSIS — M545 Low back pain: Secondary | ICD-10-CM | POA: Diagnosis not present

## 2016-10-27 DIAGNOSIS — G894 Chronic pain syndrome: Secondary | ICD-10-CM | POA: Diagnosis not present

## 2016-10-27 DIAGNOSIS — M79643 Pain in unspecified hand: Secondary | ICD-10-CM | POA: Diagnosis not present

## 2016-10-27 DIAGNOSIS — Z79899 Other long term (current) drug therapy: Secondary | ICD-10-CM | POA: Diagnosis not present

## 2016-10-27 DIAGNOSIS — M47817 Spondylosis without myelopathy or radiculopathy, lumbosacral region: Secondary | ICD-10-CM | POA: Diagnosis not present

## 2016-10-27 DIAGNOSIS — M47816 Spondylosis without myelopathy or radiculopathy, lumbar region: Secondary | ICD-10-CM | POA: Diagnosis not present

## 2016-10-29 ENCOUNTER — Telehealth: Payer: Self-pay | Admitting: Internal Medicine

## 2016-10-29 NOTE — Telephone Encounter (Signed)
lmtcb X1 for referrals dept at [facility] referrals dept to see if this referral is for trach care or if it is for COPD.  If the referral is for mgmt of his COPD, then we would be happy to see him.  If he is needing mgmt of trach care then he needs to follow up with trach clinic.

## 2016-10-30 ENCOUNTER — Telehealth: Payer: Self-pay | Admitting: Physician Assistant

## 2016-10-30 NOTE — Telephone Encounter (Signed)
He has COPD with a trach - they do not need to deal with the trach just the COPD

## 2016-10-30 NOTE — Telephone Encounter (Signed)
I [facility] and lmomtcb about this referral.

## 2016-10-30 NOTE — Telephone Encounter (Signed)
[facility], no answer, rang several times with no answer or voicemail

## 2016-10-30 NOTE — Telephone Encounter (Signed)
Pattricia BossAnnie from Primary Care at [facility] returned phone call. She stated patient need to be seen for COPD and Trach. I explained to her patient can be seen for COPD but will need to follow up with trach clinic also.

## 2016-10-30 NOTE — Telephone Encounter (Signed)
Morrie SheldonAshley and Nedra HaiLee from [facility] called regarding pt referral. They wanted to know if the pt was being referred for COPD or trach. I told them from what I could see in the referral and ov notes, pt needed referral for both. They said they are able to see the pt for COPD but he would need to go to a trach clinic for trach. Please advise. [facility] can be contacted at [phone]. Thanks.

## 2016-10-31 ENCOUNTER — Telehealth: Payer: Self-pay | Admitting: Physician Assistant

## 2016-10-31 NOTE — Telephone Encounter (Signed)
I called and lmom to make [facility] aware that they will need to refer the pt to the trach clinic but we would schedule an appt for the pt to be seen by MW for his COPD   lmomtcb x 1 for the pt to set up appt with MW.  Will need consult for COPD>

## 2016-10-31 NOTE — Telephone Encounter (Signed)
Pattricia Bossnnie called back and said that his primary care doctor only needed us to see him for his COPD

## 2016-10-31 NOTE — Telephone Encounter (Signed)
Spoke with [facility]. They are aware that they only need to see pt for COPD. Thanks!

## 2016-10-31 NOTE — Telephone Encounter (Signed)
PATIENT WOULD LIKE SARAH TO KNOW THAT HE NEEDS A REFILL ON HIS ALBUTEROL INHALER. HE ONLY HAS ENOUGH TO LAST UNTIL Monday. (I EXPLAINED OUR REFILL POLICY BUT I KNOW THIS IS HIS BREATHING MEDICINE SO I WILL PUT IT IN AS HIGH PRIORITY). BEST PHONE [phone] (CELL) PHARMACY CHOICE IS [facility] ON [address]. MBC

## 2016-11-04 ENCOUNTER — Other Ambulatory Visit: Payer: Self-pay

## 2016-11-04 MED ORDER — ALBUTEROL SULFATE HFA 108 (90 BASE) MCG/ACT IN AERS: 2.0000 | INHALATION_SPRAY | Freq: Four times a day (QID) | RESPIRATORY_TRACT | 3 refills | Status: DC | PRN

## 2016-11-04 NOTE — Telephone Encounter (Signed)
Attempted to call the pt but there was no VM.  Ok to schedule the pt with MW on 9/13 in the afternoon for consult for COPD>

## 2016-11-04 NOTE — Telephone Encounter (Signed)
Refill sent to the pharmacy 

## 2016-11-05 NOTE — Telephone Encounter (Signed)
Attempted to contact pt. No answer, no option to leave a message. Will try back.  

## 2016-11-06 NOTE — Telephone Encounter (Signed)
Attempted to contact pt x2. No answer, no option to leave a message. Will try back.  

## 2016-11-07 NOTE — Telephone Encounter (Signed)
Attempted to contact pt x3. No answer, no option to leave a message. Will try back.  

## 2016-11-10 DIAGNOSIS — M47817 Spondylosis without myelopathy or radiculopathy, lumbosacral region: Secondary | ICD-10-CM | POA: Diagnosis not present

## 2016-11-10 DIAGNOSIS — G894 Chronic pain syndrome: Secondary | ICD-10-CM | POA: Diagnosis not present

## 2016-11-10 DIAGNOSIS — Z79891 Long term (current) use of opiate analgesic: Secondary | ICD-10-CM | POA: Diagnosis not present

## 2016-11-10 DIAGNOSIS — Z79899 Other long term (current) drug therapy: Secondary | ICD-10-CM | POA: Diagnosis not present

## 2016-11-10 NOTE — Telephone Encounter (Signed)
We have attempted to contact pt several times with no success. Per triage protocol, message will be closed.  

## 2016-11-12 ENCOUNTER — Telehealth: Payer: Self-pay

## 2016-11-12 NOTE — Telephone Encounter (Signed)
Referral received from Benny LennertSarah Weber, PA to call pt to discuss need for community resources. Unable to leave vm and no answer. Will try again.    Sherle PoeNicole Nieve Rojero, B.A.  Care Guide [phone]

## 2016-11-13 ENCOUNTER — Telehealth: Payer: Self-pay | Admitting: Physician Assistant

## 2016-11-13 NOTE — Telephone Encounter (Signed)
[facility] called stated that are closing pt referral because pt cant be reached they have called the pt several times..Tyrone Cunningham

## 2016-11-13 NOTE — Telephone Encounter (Signed)
Please see note below. 

## 2016-11-16 NOTE — Telephone Encounter (Signed)
Noted.  I will discuss with patient at next visit.

## 2016-11-18 ENCOUNTER — Ambulatory Visit: Payer: Medicare Other | Admitting: Physician Assistant

## 2016-11-25 ENCOUNTER — Ambulatory Visit: Payer: Medicare Other | Admitting: Physician Assistant

## 2016-11-26 ENCOUNTER — Other Ambulatory Visit: Payer: Self-pay | Admitting: Physician Assistant

## 2016-11-26 ENCOUNTER — Telehealth: Payer: Self-pay

## 2016-11-26 DIAGNOSIS — J441 Chronic obstructive pulmonary disease with (acute) exacerbation: Secondary | ICD-10-CM

## 2016-11-26 DIAGNOSIS — R0602 Shortness of breath: Secondary | ICD-10-CM

## 2016-11-26 NOTE — Telephone Encounter (Signed)
Called pt to discuss referral for community resources. Unable to reach patient after several telephone outreach attempts. Will close referral. Pt can reach me at number below.    Sherle Poe, B.A.  Care Guide [phone]

## 2016-11-28 ENCOUNTER — Ambulatory Visit (INDEPENDENT_AMBULATORY_CARE_PROVIDER_SITE_OTHER): Payer: Medicare Other | Admitting: Physician Assistant

## 2016-11-28 ENCOUNTER — Encounter: Payer: Self-pay | Admitting: Physician Assistant

## 2016-11-28 VITALS — BP 138/78 | HR 98 | Temp 98.9°F | Resp 20 | Ht 68.0 in | Wt 274.2 lb

## 2016-11-28 DIAGNOSIS — G894 Chronic pain syndrome: Secondary | ICD-10-CM | POA: Diagnosis not present

## 2016-11-28 DIAGNOSIS — Z598 Other problems related to housing and economic circumstances: Secondary | ICD-10-CM | POA: Diagnosis not present

## 2016-11-28 DIAGNOSIS — R0602 Shortness of breath: Secondary | ICD-10-CM

## 2016-11-28 DIAGNOSIS — Z23 Encounter for immunization: Secondary | ICD-10-CM | POA: Diagnosis not present

## 2016-11-28 DIAGNOSIS — K5903 Drug induced constipation: Secondary | ICD-10-CM

## 2016-11-28 DIAGNOSIS — Z599 Problem related to housing and economic circumstances, unspecified: Secondary | ICD-10-CM

## 2016-11-28 DIAGNOSIS — J441 Chronic obstructive pulmonary disease with (acute) exacerbation: Secondary | ICD-10-CM | POA: Diagnosis not present

## 2016-11-28 DIAGNOSIS — J449 Chronic obstructive pulmonary disease, unspecified: Secondary | ICD-10-CM

## 2016-11-28 DIAGNOSIS — S51012A Laceration without foreign body of left elbow, initial encounter: Secondary | ICD-10-CM

## 2016-11-28 DIAGNOSIS — Z930 Tracheostomy status: Secondary | ICD-10-CM

## 2016-11-28 DIAGNOSIS — E1149 Type 2 diabetes mellitus with other diabetic neurological complication: Secondary | ICD-10-CM | POA: Diagnosis not present

## 2016-11-28 DIAGNOSIS — I10 Essential (primary) hypertension: Secondary | ICD-10-CM

## 2016-11-28 MED ORDER — POLYETHYLENE GLYCOL 3350 17 GM/SCOOP PO POWD: ORAL | 4 refills | Status: DC

## 2016-11-28 MED ORDER — DOCUSATE SODIUM 100 MG PO CAPS: 100.0000 mg | ORAL_CAPSULE | Freq: Two times a day (BID) | ORAL | 0 refills | Status: DC

## 2016-11-28 MED ORDER — MUPIROCIN 2 % EX OINT: 1.0000 "application " | TOPICAL_OINTMENT | Freq: Two times a day (BID) | CUTANEOUS | 0 refills | Status: DC

## 2016-11-28 MED ORDER — ALBUTEROL SULFATE (2.5 MG/3ML) 0.083% IN NEBU: INHALATION_SOLUTION | RESPIRATORY_TRACT | 2 refills | Status: DC

## 2016-11-28 NOTE — Patient Instructions (Signed)
     IF you received an x-ray today, you will receive an invoice from [facility]. Please contact [facility] at [phone] with questions or concerns regarding your invoice.   IF you received labwork today, you will receive an invoice from [facility]. Please contact [facility] at [phone] with questions or concerns regarding your invoice.   Our billing staff will not be able to assist you with questions regarding bills from these companies.  You will be contacted with the lab results as soon as they are available. The fastest way to get your results is to activate your My Chart account. Instructions are located on the last page of this paperwork. If you have not heard from us regarding the results in 2 weeks, please contact this office.     

## 2016-11-28 NOTE — Progress Notes (Signed)
Tyrone Cunningham  MRN: [id number] DOB: [birth year]  PCP: Morrell Riddle, PA-C  Chief Complaint  Patient presents with  . Constipation    month or two     Subjective:  Pt presents to clinic for constipation that has been going on for a few months.  Small hard amount of stool yesterday.  He has done nothing for this.  He has used miralax in the past and that has helped and he would like more of that.  He goes to pain management for his medications. He is only using gabapentin and his pain medication.  He is using his inhalers for his COPD.  He is not interested in treating his Diabetes at this time. He has not had his trach cleaned in a long time.  He would like a referral to ENT for check of that.  Lives in hotel with girlfriend.  He was called by some agency for multiple types of braces.  He had them fax me orders.  He has knee pain but he does not walk and he has back pain but he does not walk.  After we spoke he really does not want braces.  History is obtained by patient.  Review of Systems  Respiratory: Positive for shortness of breath (not worse than normal) and wheezing (not worse than normal).   Gastrointestinal: Positive for constipation. Negative for abdominal pain.    Patient Active Problem List   Diagnosis Date Noted  . SOB (shortness of breath) 09/13/2016  . Peripheral vascular disease (HCC) 08/19/2016  . Chronic pain syndrome 04/18/2015  . Neuropathic pain 04/18/2015  . CAP (community acquired pneumonia) 03/21/2015  . COPD exacerbation (HCC) 03/21/2015  . Constipation 01/07/2014  . Abdominal pain   . Constipation, chronic 01/04/2014  . Acute cholecystitis 01/04/2014  . Chronic respiratory failure (HCC) 01/06/2013  . Edema 03/16/2012  . Obesity 10/20/2011  . 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 OSA (obstructive sleep apnea) 10/16/2011  . [name] with frequent exacerbations    . CHF (congestive heart failure) (HCC)   . Hypertension   . Hypercholesteremia   . Hearing loss   . Peripheral neuropathy     Current Outpatient Prescriptions on File Prior to Visit  Medication Sig Dispense Refill  . albuterol (PROVENTIL HFA;VENTOLIN HFA) 108 (90 Base) MCG/ACT inhaler Inhale 2 puffs into the lungs every 6 (six) hours as needed for wheezing or shortness of breath. 3 Inhaler 3  . Fluticasone-Salmeterol (ADVAIR DISKUS) 500-50 MCG/DOSE AEPB INHALE 1 PUFF BY MOUTH INTO THE LUNGS TWICE DAILY 180 each 1  . gabapentin (NEURONTIN) 400 MG capsule Take 1 capsule (400 mg total) by mouth 3 (three) times daily. 90 capsule 0  . oxyCODONE-acetaminophen (PERCOCET) 10-325 MG per tablet Take 1 tablet by mouth every 6 (six) hours as needed for pain (pain).     Marland Kitchen umeclidinium bromide (INCRUSE ELLIPTA) 62.5 MCG/INH AEPB Inhale 1 puff into the lungs daily. 30 each 3  . amitriptyline (ELAVIL) 25 MG tablet Take 1 tablet (25 mg total) by mouth at bedtime. (Patient not taking: Reported on 11/28/2016) 90 tablet 0  . atorvastatin (LIPITOR) 20 MG tablet Take 1 tablet (20 mg total) by mouth daily. (Patient not taking: Reported on 11/28/2016) 90 tablet 3  . metFORMIN (GLUCOPHAGE XR) 500 MG 24 hr tablet Take 2 tablets (1,000 mg total) by mouth 2 (two) times daily. (Patient not taking: Reported on 11/28/2016) 360 tablet 0  .  sitaGLIPtin (JANUVIA) 100 MG tablet Take 1 tablet (100 mg total) by mouth daily. (Patient not taking: Reported on 11/28/2016) 90 tablet 0  . umeclidinium bromide (INCRUSE ELLIPTA) 62.5 MCG/INH AEPB Inhale 1 puff into the lungs daily. (Patient not taking: Reported on 11/28/2016) 30 each 3  . VOLTAREN 1 % GEL Apply 2 g topically daily as needed (hand pain).      No current facility-administered medications on file prior to visit.     No Known Allergies  Past Medical History:  Diagnosis Date  . Bronchitis   . CHF (congestive heart failure) (HCC)   . COPD (chronic obstructive pulmonary  disease) (HCC)   . DM type 2 with diabetic peripheral neuropathy (HCC)   . Hearing loss   . Hypercholesteremia   . Hypertension   . Peripheral neuropathy   . Sleep apnea    Severe sleep apnea requiring tracheostomy   Social History   Social History Narrative   Lives with girlfriend in a hotel.   In wheelchair - minimal walking   Social History  Substance Use Topics  . Smoking status: Former Smoker    Packs/day: 2.00    Years: 30.00    Types: Cigarettes    Quit date: 07/02/1999  . Smokeless tobacco: Never Used  . Alcohol use No   family history includes Coronary artery disease in his father.     Objective:  BP 138/78   Pulse 98   Temp 98.9 F (37.2 C) (Oral)   Resp 20   Ht  (1.727 m)   Wt 274 lb 3.2 oz (124.4 kg)   SpO2 91%   BMI 41.69 kg/m  Body mass index is 41.69 kg/m.  Physical Exam  Constitutional: He is oriented to person, place, and time and well-developed, well-nourished, and in no distress.  HENT:  Head: Normocephalic and atraumatic.  Right Ear: External ear normal.  Left Ear: External ear normal.  Eyes: Conjunctivae are normal.  Neck: Normal range of motion.  Cardiovascular: Normal rate, regular rhythm and normal heart sounds.   No murmur heard. Pulmonary/Chest: Effort normal. He has wheezes.  Increase expiratory phase, has a trach  Neurological: He is alert and oriented to person, place, and time. Gait normal.  Skin: Skin is warm and dry.  Scabbed wound on right arm with slight bleeding, multiple scabs on arms, scratch on left hand dorsum  Psychiatric: Mood, memory, affect and judgment normal.    Assessment and Plan :  Drug-induced constipation - Plan: polyethylene glycol powder (GLYCOLAX/MIRALAX) powder, docusate sodium (COLACE) 100 MG capsule  Need for influenza vaccination - Plan: Flu Vaccine QUAD 36+ mos IM  Skin tear of left elbow without complication, initial encounter - Plan: mupirocin ointment (BACTROBAN) 2 %  Essential  hypertension - controlled today without medication  Chronic pain syndrome - f/u with pain management  COPD exacerbation (HCC) - Plan: albuterol (PROVENTIL) (2.5 MG/3ML) 0.083% nebulizer solution  SOB (shortness of breath) - Plan: albuterol (PROVENTIL) (2.5 MG/3ML) 0.083% nebulizer solution  Tracheostomy dependent (HCC) - Plan: Ambulatory referral to ENT  DM - pt has decided that at this time he does not want to take medications for his DM.  Benny Lennert PA-C  Primary Care at [facility] 11/28/2016 9:51 PM

## 2016-12-08 DIAGNOSIS — M47816 Spondylosis without myelopathy or radiculopathy, lumbar region: Secondary | ICD-10-CM | POA: Diagnosis not present

## 2016-12-08 DIAGNOSIS — M545 Low back pain: Secondary | ICD-10-CM | POA: Diagnosis not present

## 2016-12-08 DIAGNOSIS — G894 Chronic pain syndrome: Secondary | ICD-10-CM | POA: Diagnosis not present

## 2016-12-08 DIAGNOSIS — M79643 Pain in unspecified hand: Secondary | ICD-10-CM | POA: Diagnosis not present

## 2016-12-08 DIAGNOSIS — Z79899 Other long term (current) drug therapy: Secondary | ICD-10-CM | POA: Diagnosis not present

## 2016-12-08 DIAGNOSIS — Z79891 Long term (current) use of opiate analgesic: Secondary | ICD-10-CM | POA: Diagnosis not present

## 2016-12-25 ENCOUNTER — Other Ambulatory Visit: Payer: Self-pay | Admitting: Physician Assistant

## 2016-12-25 DIAGNOSIS — G894 Chronic pain syndrome: Secondary | ICD-10-CM

## 2017-01-05 DIAGNOSIS — G5600 Carpal tunnel syndrome, unspecified upper limb: Secondary | ICD-10-CM | POA: Diagnosis not present

## 2017-01-05 DIAGNOSIS — Z79899 Other long term (current) drug therapy: Secondary | ICD-10-CM | POA: Diagnosis not present

## 2017-01-05 DIAGNOSIS — M545 Low back pain: Secondary | ICD-10-CM | POA: Diagnosis not present

## 2017-01-05 DIAGNOSIS — G894 Chronic pain syndrome: Secondary | ICD-10-CM | POA: Diagnosis not present

## 2017-01-05 DIAGNOSIS — M47816 Spondylosis without myelopathy or radiculopathy, lumbar region: Secondary | ICD-10-CM | POA: Diagnosis not present

## 2017-01-05 DIAGNOSIS — Z79891 Long term (current) use of opiate analgesic: Secondary | ICD-10-CM | POA: Diagnosis not present

## 2017-01-09 ENCOUNTER — Other Ambulatory Visit: Payer: Self-pay | Admitting: Physician Assistant

## 2017-01-09 ENCOUNTER — Telehealth: Payer: Self-pay | Admitting: Physician Assistant

## 2017-01-09 DIAGNOSIS — J441 Chronic obstructive pulmonary disease with (acute) exacerbation: Secondary | ICD-10-CM

## 2017-01-09 DIAGNOSIS — R0602 Shortness of breath: Secondary | ICD-10-CM

## 2017-01-09 NOTE — Telephone Encounter (Signed)
Left message for the pt to contact the pharmacy for refill, and advised the pt to call the office back for other concerns.

## 2017-01-09 NOTE — Telephone Encounter (Signed)
Copied from CRM [phone]. Topic: Quick Communication - See Telephone Encounter >> Jan 09, 2017 11:21 AM Diana EvesHoyt, Maryann B wrote: CRM for notification. See Telephone encounter for:  Needing refill on albuterol inhaler.  01/09/17.

## 2017-01-21 ENCOUNTER — Other Ambulatory Visit: Payer: Self-pay | Admitting: Physician Assistant

## 2017-01-21 DIAGNOSIS — G894 Chronic pain syndrome: Secondary | ICD-10-CM

## 2017-01-21 DIAGNOSIS — J441 Chronic obstructive pulmonary disease with (acute) exacerbation: Secondary | ICD-10-CM

## 2017-01-21 DIAGNOSIS — R0602 Shortness of breath: Secondary | ICD-10-CM

## 2017-01-21 NOTE — Telephone Encounter (Signed)
Rx request 

## 2017-01-21 NOTE — Telephone Encounter (Signed)
Refill request

## 2017-01-21 NOTE — Telephone Encounter (Signed)
Copied from CRM [phone]. Topic: Quick Communication - See Telephone Encounter >> Jan 21, 2017 10:38 AM Windy KalataMichael, Dagon Budai L, NT wrote: CRM for notification. See Telephone encounter for:  01/21/17.

## 2017-01-27 ENCOUNTER — Other Ambulatory Visit: Payer: Self-pay | Admitting: Physician Assistant

## 2017-01-27 ENCOUNTER — Ambulatory Visit
Admission: RE | Admit: 2017-01-27 | Discharge: 2017-01-27 | Disposition: A | Discharge status: Home / Self Care | Payer: Medicare Other | Source: Ambulatory Visit | Attending: Physician Assistant | Admitting: Physician Assistant

## 2017-01-27 DIAGNOSIS — M25551 Pain in right hip: Secondary | ICD-10-CM

## 2017-01-31 ENCOUNTER — Other Ambulatory Visit: Payer: Self-pay | Admitting: Family Medicine

## 2017-01-31 DIAGNOSIS — R0602 Shortness of breath: Secondary | ICD-10-CM

## 2017-01-31 DIAGNOSIS — J441 Chronic obstructive pulmonary disease with (acute) exacerbation: Secondary | ICD-10-CM

## 2017-02-02 DIAGNOSIS — Z79891 Long term (current) use of opiate analgesic: Secondary | ICD-10-CM | POA: Diagnosis not present

## 2017-02-02 DIAGNOSIS — G894 Chronic pain syndrome: Secondary | ICD-10-CM | POA: Diagnosis not present

## 2017-02-02 DIAGNOSIS — G5602 Carpal tunnel syndrome, left upper limb: Secondary | ICD-10-CM | POA: Diagnosis not present

## 2017-02-02 DIAGNOSIS — M47816 Spondylosis without myelopathy or radiculopathy, lumbar region: Secondary | ICD-10-CM | POA: Diagnosis not present

## 2017-02-02 DIAGNOSIS — G5600 Carpal tunnel syndrome, unspecified upper limb: Secondary | ICD-10-CM | POA: Diagnosis not present

## 2017-02-02 DIAGNOSIS — Z79899 Other long term (current) drug therapy: Secondary | ICD-10-CM | POA: Diagnosis not present

## 2017-02-02 NOTE — Telephone Encounter (Signed)
Looks like this was reordered Nov. 11th.

## 2017-02-03 ENCOUNTER — Other Ambulatory Visit: Payer: Self-pay | Admitting: Physician Assistant

## 2017-02-03 DIAGNOSIS — J441 Chronic obstructive pulmonary disease with (acute) exacerbation: Secondary | ICD-10-CM

## 2017-02-03 DIAGNOSIS — R0602 Shortness of breath: Secondary | ICD-10-CM

## 2017-02-03 MED ORDER — ALBUTEROL SULFATE HFA 108 (90 BASE) MCG/ACT IN AERS: 2.0000 | INHALATION_SPRAY | Freq: Four times a day (QID) | RESPIRATORY_TRACT | 3 refills | Status: DC | PRN

## 2017-02-03 NOTE — Telephone Encounter (Signed)
Spoke with patient he will schedule appointment before this refill runs out.

## 2017-02-03 NOTE — Telephone Encounter (Signed)
Patient presents in clinic for refill of albuterol inhaler and nebulizer solution. Per standing orders, refills of both sent to preferred pharmacy, [facility] on [address]. Confirmed appointment next week with patient. Closing note.

## 2017-02-13 ENCOUNTER — Ambulatory Visit: Payer: Medicare Other | Admitting: Emergency Medicine

## 2017-02-15 ENCOUNTER — Other Ambulatory Visit: Payer: Self-pay | Admitting: Emergency Medicine

## 2017-02-15 DIAGNOSIS — R0602 Shortness of breath: Secondary | ICD-10-CM

## 2017-02-15 DIAGNOSIS — J441 Chronic obstructive pulmonary disease with (acute) exacerbation: Secondary | ICD-10-CM

## 2017-02-17 ENCOUNTER — Ambulatory Visit: Payer: Medicare Other | Admitting: Physician Assistant

## 2017-02-17 ENCOUNTER — Ambulatory Visit: Payer: Medicare Other | Admitting: Emergency Medicine

## 2017-02-17 ENCOUNTER — Ambulatory Visit: Payer: Medicare Other

## 2017-02-18 ENCOUNTER — Telehealth: Payer: Self-pay

## 2017-02-18 NOTE — Telephone Encounter (Signed)
Tyrone Cunningham, Patient's plan has denied PA on Spiriva hand inhaler.  They would like patient to try Incruse Elpt inhaler 62.5 mg.  If this appropriate for patient please send to pharmacy, otherwise, please advise next step. Thanks, [employer]

## 2017-02-19 ENCOUNTER — Other Ambulatory Visit: Payer: Self-pay | Admitting: Physician Assistant

## 2017-02-19 NOTE — Telephone Encounter (Signed)
Per Chelle's message, I did authorize patient's refill on the Incruse Elpt Inh.  Patient aware.

## 2017-02-19 NOTE — Telephone Encounter (Signed)
I may be confused, but the Incruse Ellipta is already on this patient's medication list, twice.  OK to authorize refill if he has exhausted those on file.

## 2017-02-19 NOTE — Telephone Encounter (Signed)
Patient is following up on his medication refill- it does not matter which inhaler to him- they both work as far as he is concerned. He just needs one of them.

## 2017-02-20 NOTE — Telephone Encounter (Signed)
Meds ordered this encounter  Medications  . INCRUSE ELLIPTA 62.5 MCG/INH AEPB    Sig: INHALE 1 PUFF INTO THE LUNGS DAILY    Dispense:  30 each    Refill:  12

## 2017-02-26 ENCOUNTER — Ambulatory Visit (INDEPENDENT_AMBULATORY_CARE_PROVIDER_SITE_OTHER): Payer: Medicare Other

## 2017-02-26 VITALS — BP 120/78 | HR 92 | Ht 68.0 in | Wt 263.0 lb

## 2017-02-26 DIAGNOSIS — Z0000 Encounter for general adult medical examination without abnormal findings: Secondary | ICD-10-CM | POA: Diagnosis not present

## 2017-02-26 NOTE — Progress Notes (Signed)
Subjective:   Tyrone Cunningham is a 61 y.o. male who presents for an Initial Medicare Annual Wellness Visit.  Review of Systems  N/A Cardiac Risk Factors include: advanced age (>84men, >41 women);diabetes mellitus;hypertension;dyslipidemia;obesity (BMI >30kg/m2);male gender    Objective:    Today's Vitals   02/26/17 1539 02/26/17 1547  BP: 120/78   Pulse: 92   SpO2: 96%   Weight: 263 lb (119.3 kg)   Height: 5\' 8"  (1.727 m)   PainSc:  7    Body mass index is 39.99 kg/m.  Advanced Directives 02/26/2017 03/21/2015 01/03/2014 10/16/2011 03/09/2011  Does Patient Have a Medical Advance Directive? No No No Patient has advance directive, copy not in chart Patient does not have advance directive;Patient would like information  Type of Advance Directive - - - Living will -  Copy of Healthcare Power of Attorney in Chart? - - - Copy requested from family -  Would patient like information on creating a medical advance directive? Yes (MAU/Ambulatory/Procedural Areas - Information given) No - patient declined information No - patient declined information - -  Pre-existing out of facility DNR order (yellow form or pink MOST form) - - - No -    Current Medications (verified) Outpatient Encounter Medications as of 02/26/2017  Medication Sig  . albuterol (PROVENTIL) (2.5 MG/3ML) 0.083% nebulizer solution USE ONE VIAL VIA NEBULIZER EVERY 6 HOURS AS NEEDED FOR WHEEZING OR SHORTNESS OF BREATH  . amitriptyline (ELAVIL) 25 MG tablet Take 1 tablet (25 mg total) by mouth at bedtime.  Marland Kitchen atorvastatin (LIPITOR) 20 MG tablet Take 1 tablet (20 mg total) by mouth daily.  Marland Kitchen docusate sodium (COLACE) 100 MG capsule Take 1 capsule (100 mg total) by mouth 2 (two) times daily.  . Fluticasone-Salmeterol (ADVAIR DISKUS) 500-50 MCG/DOSE AEPB INHALE 1 PUFF BY MOUTH INTO THE LUNGS TWICE DAILY  . gabapentin (NEURONTIN) 400 MG capsule TAKE ONE CAPSULE BY MOUTH THREE TIMES DAILY  . INCRUSE ELLIPTA 62.5 MCG/INH AEPB INHALE 1  PUFF INTO THE LUNGS DAILY  . metFORMIN (GLUCOPHAGE XR) 500 MG 24 hr tablet Take 2 tablets (1,000 mg total) by mouth 2 (two) times daily.  . mupirocin ointment (BACTROBAN) 2 % Place 1 application into the nose 2 (two) times daily.  . polyethylene glycol powder (GLYCOLAX/MIRALAX) powder MIX ONE CAPFFUL IN LIQUID OF CHOICE AND DRINK DAILY AS DIRECTED  . sitaGLIPtin (JANUVIA) 100 MG tablet Take 1 tablet (100 mg total) by mouth daily.  Marland Kitchen umeclidinium bromide (INCRUSE ELLIPTA) 62.5 MCG/INH AEPB Inhale 1 puff into the lungs daily.  . VOLTAREN 1 % GEL Apply 2 g topically daily as needed (hand pain).   . [DISCONTINUED] oxyCODONE-acetaminophen (PERCOCET) 10-325 MG per tablet Take 1 tablet by mouth every 6 (six) hours as needed for pain (pain).    No facility-administered encounter medications on file as of 02/26/2017.     Allergies (verified) Patient has no known allergies.   History: Past Medical History:  Diagnosis Date  . Bronchitis   . CHF (congestive heart failure) (HCC)   . COPD (chronic obstructive pulmonary disease) (HCC)   . DM type 2 with diabetic peripheral neuropathy (HCC)   . Hearing loss   . Hypercholesteremia   . Hypertension   . Peripheral neuropathy   . Sleep apnea    Severe sleep apnea requiring tracheostomy   Past Surgical History:  Procedure Laterality Date  . APPENDECTOMY    . CHOLECYSTECTOMY N/A 01/05/2014   Procedure: LAPAROSCOPIC CHOLECYSTECTOMY;  Surgeon: Emelia Loron, MD;  Location:  [facility];  Service: General;  Laterality: N/A;  . FEMUR HARDWARE REMOVAL  09/01/2003   Removal of retained hardware, two interlocking distal femoral  . FEMUR SURGERY    . HAND SURGERY    . MULTIPLE EXTRACTIONS WITH ALVEOLOPLASTY N/A 05/11/2012   Procedure: MULTIPLE EXTRACTION WITH ALVEOLOPLASTY;  Surgeon: Georgia LopesScott M Jensen, DDS;  Location: [facility];  Service: Oral Surgery;  Laterality: N/A;  . TRACHEOSTOMY     Family History  Problem Relation Age of Onset  . Coronary artery disease  Father    Social History   Socioeconomic History  . Marital status: Single    Spouse name: None  . Number of children: 0  . Years of education: None  . Highest education level: GED or equivalent  Social Needs  . Financial resource strain: Not hard at all  . Food insecurity - worry: Sometimes true  . Food insecurity - inability: Sometimes true  . Transportation needs - medical: No  . Transportation needs - non-medical: No  Occupational History  . None  Tobacco Use  . Smoking status: Former Smoker    Packs/day: 2.00    Years: 30.00    Pack years: 60.00    Types: Cigarettes    Last attempt to quit: 07/02/1999    Years since quitting: 17.6  . Smokeless tobacco: Never Used  Substance and Sexual Activity  . Alcohol use: No  . Drug use: No  . Sexual activity: No  Other Topics Concern  . None  Social History Narrative   Lives with girlfriend in a hotel.   In wheelchair - minimal walking   Tobacco Counseling Counseling given: Not Answered   Clinical Intake:  Pre-visit preparation completed: Yes  Pain : 0-10 Pain Score: 7  Pain Type: Chronic pain Pain Location: Hand Pain Descriptors / Indicators: Aching Pain Onset: More than a month ago Pain Frequency: Constant     Nutritional Status: BMI > 30  Obese Nutritional Risks: None Diabetes: Yes CBG done?: No Did pt. bring in CBG monitor from home?: No  How often do you need to have someone help you when you read instructions, pamphlets, or other written materials from your doctor or pharmacy?: 1 - Never What is the last grade level you completed in school?: GED  Interpreter Needed?: No  Information entered by :: Janalyn Shyalandra Tashana Haberl, LPN  Activities of Daily Living In your present state of health, do you have any difficulty performing the following activities: 02/26/2017  Hearing? Y  Comment Patient has issues with his hearing   Vision? Y  Comment Patient has issues with his vision  Difficulty concentrating or  making decisions? Y  Comment Patient has issue remembering things sometimes  Walking or climbing stairs? Y  Comment Patient is in a wheelchair  Dressing or bathing? N  Doing errands, shopping? N  Preparing Food and eating ? N  Using the Toilet? N  In the past six months, have you accidently leaked urine? N  Do you have problems with loss of bowel control? N  Managing your Medications? N  Managing your Finances? N  Housekeeping or managing your Housekeeping? N  Some recent data might be hidden     Immunizations and Health Maintenance Immunization History  Administered Date(s) Administered  . Influenza Split 11/25/2011  . Influenza,inj,Quad PF,6+ Mos 11/19/2012, 01/03/2014, 11/28/2016  . Pneumococcal Conjugate-13 09/20/2013  . Pneumococcal Polysaccharide-23 01/26/2007, 03/10/2011  . Tdap 10/12/2007   Health Maintenance Due  Topic Date Due  . OPHTHALMOLOGY EXAM  05/01/2015  . HEMOGLOBIN A1C  02/03/2017    Patient Care Team: Larkin Ina as PCP - General (Physician Assistant) Flo Shanks, MD as Consulting Physician (Otolaryngology) Ardell Isaacs, MD (Pain Medicine)  Indicate any recent Medical Services you may have received from other than [facility] providers in the past year (date may be approximate).    Assessment:   This is a routine wellness examination for [employer].  Hearing/Vision screen Hearing Screening Comments: Patient has had a hearing exam a long time ago Vision Screening Comments: Patient has not seen an eye doctor recently  Dietary issues and exercise activities discussed: Current Exercise Habits: The patient does not participate in regular exercise at present, Exercise limited by: None identified  Goals    . DIET - REDUCE SUGAR INTAKE     Patient would like to try to improve diet to help his diabetes      Depression Screen PHQ 2/9 Scores 02/26/2017 11/28/2016 08/04/2016 07/05/2015  PHQ - 2 Score 1 0 0 0  PHQ- 9 Score - - - -  Exception Documentation -  - - -    Fall Risk Fall Risk  02/26/2017 11/28/2016 07/05/2015 06/21/2015 10/26/2014  Falls in the past year? No Yes No No No  Number falls in past yr: - 1 - - -  Injury with Fall? - No - - -  Risk for fall due to : - - Impaired mobility - -    Is the patient's home free of loose throw rugs in walkways, pet beds, electrical cords, etc?   yes      Grab bars in the bathroom? yes      Handrails on the stairs?   no, no stairs      Adequate lighting?   yes  Timed Get Up and Go performed: Patient is in a wheelchair. Patient has a hard time breathing.   Cognitive Function:     6CIT Screen 02/26/2017  What Year? 0 points  What month? 0 points  What time? 0 points  Count back from 20 0 points  Months in reverse 0 points  Repeat phrase 0 points  Total Score 0    Screening Tests Health Maintenance  Topic Date Due  . OPHTHALMOLOGY EXAM  05/01/2015  . HEMOGLOBIN A1C  02/03/2017  . URINE MICROALBUMIN  02/27/2017 (Originally 10/26/2015)  . PNEUMOCOCCAL POLYSACCHARIDE VACCINE (2) 02/16/2018 (Originally 01/26/2012)  . COLONOSCOPY  02/26/2018 (Originally 08/03/2003)  . Hepatitis C Screening  02/26/2018 (Originally 1956/01/03)  . HIV Screening  02/26/2018 (Originally 08/02/1970)  . FOOT EXAM  08/19/2017  . TETANUS/TDAP  10/11/2017  . INFLUENZA VACCINE  Completed    Qualifies for Shingles Vaccine? Yes, advised patient to check with pharmacy about receiving the Shingrix vaccine  Cancer Screenings: Lung: Low Dose CT Chest recommended if Age 24-80 years, 30 pack-year currently smoking OR have quit w/in 15years. Patient does not qualify. Colorectal: Patient declined colonoscopy   Additional Screenings:  Hepatitis B/HIV/Syphillis: Declined HIV at this time, Hep B and Syphillis not indicated  Hepatitis C Screening: Declined at this time      Plan:   I have personally reviewed and noted the following in the patient's chart:   . Medical and social history . Use of alcohol, tobacco or illicit  drugs  . Current medications and supplements . Functional ability and status . Nutritional status . Physical activity . Advanced directives . List of other physicians . Hospitalizations, surgeries, and ER visits in previous 12 months . Vitals .  Screenings to include cognitive, depression, and falls . Referrals and appointments  In addition, I have reviewed and discussed with patient certain preventive protocols, quality metrics, and best practice recommendations. A written personalized care plan for preventive services as well as general preventive health recommendations were provided to patient.   Patient declined blood work and colonoscopy at this time.  1. Encounter for Medicare annual wellness exam  Janalyn ShyJohnson, Othelia Riederer, LPN   [id number]/27/2018

## 2017-02-26 NOTE — Patient Instructions (Addendum)
Mr. Tyrone Cunningham , Thank you for taking time to come for your Medicare Wellness Visit. I appreciate your ongoing commitment to your health goals. Please review the following plan we discussed and let me know if I can assist you in the future.   Screening recommendations/referrals: Colonoscopy: declined Recommended yearly ophthalmology/optometry visit for glaucoma screening and checkup Recommended yearly dental visit for hygiene and checkup  Vaccinations: Influenza vaccine: up to date Pneumococcal vaccine: up to date Tdap vaccine: up to date, next due 10/11/2017 Shingles vaccine: Check with your pharmacy about receiving the Shingrix vaccine    Advanced directives: Advance directive discussed with you today. I have provided a copy for you to complete at home and have notarized. Once this is complete please bring a copy in to our office so we can scan it into your chart.   Conditions/risks identified: Try to improve diet to help your diabetes  Next appointment: 02/27/17 @ 2:40 pm with Dr. Alvy BimlerSagardia  Preventive Care 40-64 Years, Male Preventive care refers to lifestyle choices and visits with your health care provider that can promote health and wellness. What does preventive care include?  A yearly physical exam. This is also called an annual well check.  Dental exams once or twice a year.  Routine eye exams. Ask your health care provider how often you should have your eyes checked.  Personal lifestyle choices, including:  Daily care of your teeth and gums.  Regular physical activity.  Eating a healthy diet.  Avoiding tobacco and drug use.  Limiting alcohol use.  Practicing safe sex.  Taking low-dose aspirin every day starting at age 63. What happens during an annual well check? The services and screenings done by your health care provider during your annual well check will depend on your age, overall health, lifestyle risk factors, and family history of disease. Counseling  Your  health care provider may ask you questions about your:  Alcohol use.  Tobacco use.  Drug use.  Emotional well-being.  Home and relationship well-being.  Sexual activity.  Eating habits.  Work and work Astronomerenvironment. Screening  You may have the following tests or measurements:  Height, weight, and BMI.  Blood pressure.  Lipid and cholesterol levels. These may be checked every 5 years, or more frequently if you are over 63 years old.  Skin check.  Lung cancer screening. You may have this screening every year starting at age 63 if you have a 30-pack-year history of smoking and currently smoke or have quit within the past 15 years.  Fecal occult blood test (FOBT) of the stool. You may have this test every year starting at age 63.  Flexible sigmoidoscopy or colonoscopy. You may have a sigmoidoscopy every 5 years or a colonoscopy every 10 years starting at age 63.  Prostate cancer screening. Recommendations will vary depending on your family history and other risks.  Hepatitis C blood test.  Hepatitis B blood test.  Sexually transmitted disease (STD) testing.  Diabetes screening. This is done by checking your blood sugar (glucose) after you have not eaten for a while (fasting). You may have this done every 1-3 years. Discuss your test results, treatment options, and if necessary, the need for more tests with your health care provider. Vaccines  Your health care provider may recommend certain vaccines, such as:  Influenza vaccine. This is recommended every year.  Tetanus, diphtheria, and acellular pertussis (Tdap, Td) vaccine. You may need a Td booster every 10 years.  Zoster vaccine. You may need this  after age 7.  Pneumococcal 13-valent conjugate (PCV13) vaccine. You may need this if you have certain conditions and have not been vaccinated.  Pneumococcal polysaccharide (PPSV23) vaccine. You may need one or two doses if you smoke cigarettes or if you have certain  conditions. Talk to your health care provider about which screenings and vaccines you need and how often you need them. This information is not intended to replace advice given to you by your health care provider. Make sure you discuss any questions you have with your health care provider. Document Released: 03/16/2015 Document Revised: 11/07/2015 Document Reviewed: 12/19/2014 Elsevier Interactive Patient Education  2017 [employer].  Fall Prevention in the Home Falls can cause injuries. They can happen to people of all ages. There are many things you can do to make your home safe and to help prevent falls. What can I do on the outside of my home?  Regularly fix the edges of walkways and driveways and fix any cracks.  Remove anything that might make you trip as you walk through a door, such as a raised step or threshold.  Trim any bushes or trees on the path to your home.  Use bright outdoor lighting.  Clear any walking paths of anything that might make someone trip, such as rocks or tools.  Regularly check to see if handrails are loose or broken. Make sure that both sides of any steps have handrails.  Any raised decks and porches should have guardrails on the edges.  Have any leaves, snow, or ice cleared regularly.  Use sand or salt on walking paths during winter.  Clean up any spills in your garage right away. This includes oil or grease spills. What can I do in the bathroom?  Use night lights.  Install grab bars by the toilet and in the tub and shower. Do not use towel bars as grab bars.  Use non-skid mats or decals in the tub or shower.  If you need to sit down in the shower, use a plastic, non-slip stool.  Keep the floor dry. Clean up any water that spills on the floor as soon as it happens.  Remove soap buildup in the tub or shower regularly.  Attach bath mats securely with double-sided non-slip rug tape.  Do not have throw rugs and other things on the floor that  can make you trip. What can I do in the bedroom?  Use night lights.  Make sure that you have a light by your bed that is easy to reach.  Do not use any sheets or blankets that are too big for your bed. They should not hang down onto the floor.  Have a firm chair that has side arms. You can use this for support while you get dressed.  Do not have throw rugs and other things on the floor that can make you trip. What can I do in the kitchen?  Clean up any spills right away.  Avoid walking on wet floors.  Keep items that you use a lot in easy-to-reach places.  If you need to reach something above you, use a strong step stool that has a grab bar.  Keep electrical cords out of the way.  Do not use floor polish or wax that makes floors slippery. If you must use wax, use non-skid floor wax.  Do not have throw rugs and other things on the floor that can make you trip. What can I do with my stairs?  Do not leave  any items on the stairs.  Make sure that there are handrails on both sides of the stairs and use them. Fix handrails that are broken or loose. Make sure that handrails are as long as the stairways.  Check any carpeting to make sure that it is firmly attached to the stairs. Fix any carpet that is loose or worn.  Avoid having throw rugs at the top or bottom of the stairs. If you do have throw rugs, attach them to the floor with carpet tape.  Make sure that you have a light switch at the top of the stairs and the bottom of the stairs. If you do not have them, ask someone to add them for you. What else can I do to help prevent falls?  Wear shoes that:  Do not have high heels.  Have rubber bottoms.  Are comfortable and fit you well.  Are closed at the toe. Do not wear sandals.  If you use a stepladder:  Make sure that it is fully opened. Do not climb a closed stepladder.  Make sure that both sides of the stepladder are locked into place.  Ask someone to hold it for  you, if possible.  Clearly Daeshawn and make sure that you can see:  Any grab bars or handrails.  First and last steps.  Where the edge of each step is.  Use tools that help you move around (mobility aids) if they are needed. These include:  Canes.  Walkers.  Scooters.  Crutches.  Turn on the lights when you go into a dark area. Replace any light bulbs as soon as they burn out.  Set up your furniture so you have a clear path. Avoid moving your furniture around.  If any of your floors are uneven, fix them.  If there are any pets around you, be aware of where they are.  Review your medicines with your doctor. Some medicines can make you feel dizzy. This can increase your chance of falling. Ask your doctor what other things that you can do to help prevent falls. This information is not intended to replace advice given to you by your health care provider. Make sure you discuss any questions you have with your health care provider. Document Released: 12/14/2008 Document Revised: 07/26/2015 Document Reviewed: 03/24/2014 Elsevier Interactive Patient Education  2017 [employer]

## 2017-02-27 ENCOUNTER — Ambulatory Visit: Payer: Medicare Other | Admitting: Emergency Medicine

## 2017-02-28 ENCOUNTER — Ambulatory Visit: Payer: Medicare Other | Admitting: Emergency Medicine

## 2017-03-04 ENCOUNTER — Ambulatory Visit (INDEPENDENT_AMBULATORY_CARE_PROVIDER_SITE_OTHER): Payer: Medicare Other | Admitting: Emergency Medicine

## 2017-03-04 ENCOUNTER — Other Ambulatory Visit: Payer: Self-pay

## 2017-03-04 ENCOUNTER — Encounter: Payer: Self-pay | Admitting: Emergency Medicine

## 2017-03-04 VITALS — BP 138/76 | HR 103 | Temp 99.0°F | Resp 18 | Ht 68.0 in

## 2017-03-04 DIAGNOSIS — G894 Chronic pain syndrome: Secondary | ICD-10-CM

## 2017-03-04 DIAGNOSIS — J449 Chronic obstructive pulmonary disease, unspecified: Secondary | ICD-10-CM | POA: Diagnosis not present

## 2017-03-04 DIAGNOSIS — E7800 Pure hypercholesterolemia, unspecified: Secondary | ICD-10-CM | POA: Diagnosis not present

## 2017-03-04 DIAGNOSIS — E119 Type 2 diabetes mellitus without complications: Secondary | ICD-10-CM

## 2017-03-04 MED ORDER — ALBUTEROL SULFATE (2.5 MG/3ML) 0.083% IN NEBU: INHALATION_SOLUTION | RESPIRATORY_TRACT | 1 refills | Status: DC

## 2017-03-04 MED ORDER — ATORVASTATIN CALCIUM 20 MG PO TABS: 20.0000 mg | ORAL_TABLET | Freq: Every day | ORAL | 3 refills | Status: DC

## 2017-03-04 MED ORDER — GABAPENTIN 400 MG PO CAPS: 400.0000 mg | ORAL_CAPSULE | Freq: Three times a day (TID) | ORAL | 1 refills | Status: DC

## 2017-03-04 MED ORDER — METFORMIN HCL ER 500 MG PO TB24: 1000.0000 mg | ORAL_TABLET | Freq: Two times a day (BID) | ORAL | 1 refills | Status: DC

## 2017-03-04 MED ORDER — SITAGLIPTIN PHOSPHATE 100 MG PO TABS: 100.0000 mg | ORAL_TABLET | Freq: Every day | ORAL | 1 refills | Status: DC

## 2017-03-04 MED ORDER — UMECLIDINIUM BROMIDE 62.5 MCG/INH IN AEPB: 1.0000 | INHALATION_SPRAY | Freq: Every day | RESPIRATORY_TRACT | 3 refills | Status: DC

## 2017-03-04 NOTE — Patient Instructions (Signed)
     IF you received an x-ray today, you will receive an invoice from [facility]. Please contact [facility] at [phone] with questions or concerns regarding your invoice.   IF you received labwork today, you will receive an invoice from [facility]. Please contact [facility] at [phone] with questions or concerns regarding your invoice.   Our billing staff will not be able to assist you with questions regarding bills from these companies.  You will be contacted with the lab results as soon as they are available. The fastest way to get your results is to activate your My Chart account. Instructions are located on the last page of this paperwork. If you have not heard from us regarding the results in 2 weeks, please contact this office.     

## 2017-03-04 NOTE — Progress Notes (Signed)
Tyrone Cunningham 62 y.o.   Chief Complaint  Patient presents with  . Medication Refill    pt states he isnt sure what he is out of     HISTORY OF PRESENT ILLNESS: This is a 62 y.o. male here for medication refills; seen here 12/27 for Medicare annual wellness exam. Stable chronic medical problems.  HPI   Prior to Admission medications   Medication Sig Start Date End Date Taking? Authorizing Provider  albuterol (PROVENTIL) (2.5 MG/3ML) 0.083% nebulizer solution USE ONE VIAL VIA NEBULIZER EVERY 6 HOURS AS NEEDED FOR WHEEZING OR SHORTNESS OF BREATH 02/15/17  Yes Weber, [name], PA-C  amitriptyline (ELAVIL) 25 MG tablet Take 1 tablet (25 mg total) by mouth at bedtime. 08/19/16  Yes Weber, [name], PA-C  atorvastatin (LIPITOR) 20 MG tablet Take 1 tablet (20 mg total) by mouth daily. 08/19/16  Yes Weber, [name], PA-C  docusate sodium (COLACE) 100 MG capsule Take 1 capsule (100 mg total) by mouth 2 (two) times daily. 11/28/16  Yes Weber, Dema SeverinSarah L, PA-C  Fluticasone-Salmeterol (ADVAIR DISKUS) 500-50 MCG/DOSE AEPB INHALE 1 PUFF BY MOUTH INTO THE LUNGS TWICE DAILY 08/04/16  Yes Weber, [name], PA-C  gabapentin (NEURONTIN) 400 MG capsule TAKE ONE CAPSULE BY MOUTH THREE TIMES DAILY 01/21/17  Yes Ethelda ChickSmith, Kristi M, MD  INCRUSE ELLIPTA 62.5 MCG/INH AEPB INHALE 1 PUFF INTO THE LUNGS DAILY 02/20/17  Yes Jeffery, Chelle, PA-C  metFORMIN (GLUCOPHAGE XR) 500 MG 24 hr tablet Take 2 tablets (1,000 mg total) by mouth 2 (two) times daily. 08/19/16  Yes Weber, Dema SeverinSarah L, PA-C  mupirocin ointment (BACTROBAN) 2 % Place 1 application into the nose 2 (two) times daily. 11/28/16  Yes Weber, [name], PA-C  polyethylene glycol powder (GLYCOLAX/MIRALAX) powder MIX ONE CAPFFUL IN LIQUID OF CHOICE AND DRINK DAILY AS DIRECTED 11/28/16  Yes Weber, [name], PA-C  sitaGLIPtin (JANUVIA) 100 MG tablet Take 1 tablet (100 mg total) by mouth daily. 08/19/16  Yes Weber, [name], PA-C  umeclidinium bromide (INCRUSE ELLIPTA) 62.5 MCG/INH AEPB Inhale 1  puff into the lungs daily. 08/05/16  Yes Weber, [name], PA-C  VOLTAREN 1 % GEL Apply 2 g topically daily as needed (hand pain).    Yes [provider]    No Known Allergies  Patient Active Problem List   Diagnosis Date Noted  . SOB (shortness of breath) 09/13/2016  . Peripheral vascular disease (HCC) 08/19/2016  . Tracheostomy care (HCC) 06/28/2015  . Obstructive sleep apnea 06/28/2015  . Chronic pain syndrome 04/18/2015  . Neuropathic pain 04/18/2015  . CAP (community acquired pneumonia) 03/21/2015  . COPD exacerbation (HCC) 03/21/2015  . Constipation 01/07/2014  . Abdominal pain   . Constipation, chronic 01/04/2014  . Acute cholecystitis 01/04/2014  . Chronic respiratory failure (HCC) 01/06/2013  . Edema 03/16/2012  . Obesity 10/20/2011  . 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 OSA (obstructive sleep apnea) 10/16/2011  . [name] with frequent exacerbations   . CHF (congestive heart failure) (HCC)   . Hypertension   . Hypercholesteremia   . Hearing loss   . Peripheral neuropathy     Past Medical History:  Diagnosis Date  . Bronchitis   . CHF (congestive heart failure) (HCC)   . COPD (chronic obstructive pulmonary disease) (HCC)   . DM type 2 with diabetic peripheral neuropathy (HCC)   . Hearing loss   . Hypercholesteremia   . Hypertension   .  Peripheral neuropathy   . Sleep apnea    Severe sleep apnea requiring tracheostomy    Past Surgical History:  Procedure Laterality Date  . APPENDECTOMY    . CHOLECYSTECTOMY N/A 01/05/2014   Procedure: LAPAROSCOPIC CHOLECYSTECTOMY;  Surgeon: Emelia Loron, MD;  Location: [facility];  Service: General;  Laterality: N/A;  . FEMUR HARDWARE REMOVAL  09/01/2003   Removal of retained hardware, two interlocking distal femoral  . FEMUR SURGERY    . HAND SURGERY    . MULTIPLE EXTRACTIONS WITH ALVEOLOPLASTY N/A 05/11/2012   Procedure:  MULTIPLE EXTRACTION WITH ALVEOLOPLASTY;  Surgeon: [name], DDS;  Location: [facility];  Service: Oral Surgery;  Laterality: N/A;  . TRACHEOSTOMY      Social History   Socioeconomic History  . Marital status: Single    Spouse name: Not on file  . Number of children: 0  . Years of education: Not on file  . Highest education level: GED or equivalent  Social Needs  . Financial resource strain: Not hard at all  . Food insecurity - worry: Sometimes true  . Food insecurity - inability: Sometimes true  . Transportation needs - medical: No  . Transportation needs - non-medical: No  Occupational History  . Not on file  Tobacco Use  . Smoking status: Former Smoker    Packs/day: 2.00    Years: 30.00    Pack years: 60.00    Types: Cigarettes    Last attempt to quit: 07/02/1999    Years since quitting: 17.6  . Smokeless tobacco: Never Used  Substance and Sexual Activity  . Alcohol use: No  . Drug use: No  . Sexual activity: No  Other Topics Concern  . Not on file  Social History Narrative   Lives with girlfriend in a hotel.   In wheelchair - minimal walking    Family History  Problem Relation Age of Onset  . Coronary artery disease Father      Review of Systems  Constitutional: Negative for chills and fever.  HENT: Positive for hearing loss.   Eyes: Negative for blurred vision, double vision, discharge and redness.  Respiratory: Negative for shortness of breath.   Cardiovascular: Negative for chest pain.  Gastrointestinal: Negative for abdominal pain, nausea and vomiting.  Neurological: Negative for dizziness and headaches.  Endo/Heme/Allergies: Negative.   All other systems reviewed and are negative.    Vitals:   03/04/17 1338  BP: 138/76  Pulse: (!) 103  Resp: 18  Temp: 99 F (37.2 C)  SpO2: 96%     Physical Exam  Constitutional: He is oriented to person, place, and time. He appears well-developed.  HENT:  Head: Normocephalic.  Eyes: Pupils are equal,  round, and reactive to light.  Neck: Normal range of motion.  Cardiovascular: Normal rate.  Pulmonary/Chest: Effort normal.  Neurological: He is alert and oriented to person, place, and time.  Skin: Skin is warm and dry.  Psychiatric: He has a normal mood and affect. His behavior is normal.  Vitals reviewed.    ASSESSMENT & PLAN: Tyrone Cunningham was seen today for medication refill.  Diagnoses and all orders for this visit:  Type 2 diabetes mellitus without complication, without long-term current use of insulin (HCC) -     metFORMIN (GLUCOPHAGE XR) 500 MG 24 hr tablet; Take 2 tablets (1,000 mg total) by mouth 2 (two) times daily. -     sitaGLIPtin (JANUVIA) 100 MG tablet; Take 1 tablet (100 mg total) by mouth daily.  Elevated cholesterol -  atorvastatin (LIPITOR) 20 MG tablet; Take 1 tablet (20 mg total) by mouth daily.  Chronic pain syndrome -     gabapentin (NEURONTIN) 400 MG capsule; Take 1 capsule (400 mg total) by mouth 3 (three) times daily.  [name] with frequent exacerbations -     umeclidinium bromide (INCRUSE ELLIPTA) 62.5 MCG/INH AEPB; Inhale 1 puff into the lungs daily. -     albuterol (PROVENTIL) (2.5 MG/3ML) 0.083% nebulizer solution; USE ONE VIAL VIA NEBULIZER EVERY 6 HOURS AS NEEDED FOR WHEEZING OR SHORTNESS OF BREATH    Follow up with PCP in 3 months.  Edwina Barth, MD [facility]

## 2017-03-20 ENCOUNTER — Telehealth: Payer: Self-pay | Admitting: *Deleted

## 2017-03-20 NOTE — Telephone Encounter (Signed)
Faxed signed Rx for Albuterol to [facility], per Dr Alvy BimlerSagardia. Confirmation page received at 1:37 pm.

## 2017-04-06 ENCOUNTER — Other Ambulatory Visit: Payer: Self-pay | Admitting: Emergency Medicine

## 2017-04-06 ENCOUNTER — Telehealth: Payer: Self-pay | Admitting: Physician Assistant

## 2017-04-06 DIAGNOSIS — J449 Chronic obstructive pulmonary disease, unspecified: Secondary | ICD-10-CM

## 2017-04-06 NOTE — Telephone Encounter (Signed)
Patient called [phone], unable to leave VM due to mailbox full, called to inform patient there were refills of medication [facility].

## 2017-04-06 NOTE — Telephone Encounter (Signed)
Copied from CRM [id number]. Topic: General - Other >> Apr 06, 2017  3:31 PM Raquel SarnaHayes, Teresa G wrote: Albuterol Incruse  Pt is needing more refills asap  [facility] [phone] [address] [id number] [facility] [address] [facility] [address] [place] [id number] Phone: [phone] Fax: [phone]

## 2017-04-19 ENCOUNTER — Other Ambulatory Visit: Payer: Self-pay | Admitting: Emergency Medicine

## 2017-04-19 DIAGNOSIS — J449 Chronic obstructive pulmonary disease, unspecified: Secondary | ICD-10-CM

## 2017-04-19 DIAGNOSIS — G894 Chronic pain syndrome: Secondary | ICD-10-CM

## 2017-04-27 ENCOUNTER — Other Ambulatory Visit: Payer: Self-pay | Admitting: Emergency Medicine

## 2017-04-27 ENCOUNTER — Ambulatory Visit: Payer: Self-pay | Admitting: *Deleted

## 2017-04-27 DIAGNOSIS — J449 Chronic obstructive pulmonary disease, unspecified: Secondary | ICD-10-CM

## 2017-04-27 NOTE — Telephone Encounter (Signed)
I sent a Rx for Albuterol on 1/2 2019 to patient's pharmacy. Also, Call Center has tried to contact patient and voice mail is full or has not been set up. Please advise, thank you.

## 2017-04-27 NOTE — Telephone Encounter (Signed)
Pt states he has lost his med the albuterol for his nebulizer. Can he get another refill? He is having some shortness of breath right now. Advised him to go to the urgent care to get a breathing treatment.   LOV  03/04/17  PCP: Benny LennertSarah Weber, PA-C  [phone] [address]  Please review.

## 2017-04-27 NOTE — Telephone Encounter (Signed)
Pt called because he wanted a refill on his albuterol he uses for his nebulizer. He has misplaced the one he has. He states that the shortness of breatht is getting worst. He denies any other symptom. Not able to answer all questions in assessment. Telephone encounter sent regarding his need for more albuterol. He has someone there Olegario Messier(Kathy) to take him to urgent care to get a breathing treatment.  Reason for Disposition . [1] Longstanding difficulty breathing (e.g., CHF, COPD, emphysema) AND [2] WORSE than normal  Answer Assessment - Initial Assessment Questions 1. RESPIRATORY STATUS: "Describe your breathing?" (e.g., wheezing, shortness of breath, unable to speak, severe coughing)      Shortness of breath 2. ONSET: "When did this breathing problem begin?"      On going 3. PATTERN "Does the difficult breathing come and go, or has it been constant since it started?"      constant 4. SEVERITY: "How bad is your breathing?" (e.g., mild, moderate, severe)    - MILD: No SOB at rest, mild SOB with walking, speaks normally in sentences, can lay down, no retractions, pulse < 100.    - MODERATE: SOB at rest, SOB with minimal exertion and prefers to sit, cannot lie down flat, speaks in phrases, mild retractions, audible wheezing, pulse 100-120.    - SEVERE: Very SOB at rest, speaks in single words, struggling to breathe, sitting hunched forward, retractions, pulse > 120      Moderated to severe 5. RECURRENT SYMPTOM: "Have you had difficulty breathing before?" If so, ask: "When was the last time?" and "What happened that time?"      yes 6. CARDIAC HISTORY: "Do you have any history of heart disease?" (e.g., heart attack, angina, bypass surgery, angioplasty)       7. LUNG HISTORY: "Do you have any history of lung disease?"  (e.g., pulmonary embolus, asthma, emphysema)     COPD 8. CAUSE: "What do you think is causing the breathing problem?"      Unable to located med for nebulizer  9. OTHER SYMPTOMS: "Do  you have any other symptoms? (e.g., dizziness, runny nose, cough, chest pain, fever)      10. PREGNANCY: "Is there any chance you are pregnant?" "When was your last menstrual period?"        11. TRAVEL: "Have you traveled out of the country in the last month?" (e.g., travel history, exposures)  Protocols used: BREATHING DIFFICULTY-A-AH

## 2017-04-28 NOTE — Telephone Encounter (Signed)
Done

## 2017-04-29 ENCOUNTER — Encounter (HOSPITAL_COMMUNITY): Payer: Self-pay | Admitting: Emergency Medicine

## 2017-04-29 ENCOUNTER — Other Ambulatory Visit: Payer: Self-pay

## 2017-04-29 ENCOUNTER — Emergency Department (HOSPITAL_COMMUNITY): Payer: Medicare Other

## 2017-04-29 ENCOUNTER — Telehealth: Payer: Self-pay | Admitting: Physician Assistant

## 2017-04-29 ENCOUNTER — Emergency Department (HOSPITAL_COMMUNITY)
Admission: EM | Admit: 2017-04-29 | Discharge: 2017-04-29 | Disposition: A | Discharge status: Home / Self Care | Payer: Medicare Other | Attending: Emergency Medicine | Admitting: Emergency Medicine

## 2017-04-29 DIAGNOSIS — Z87891 Personal history of nicotine dependence: Secondary | ICD-10-CM | POA: Insufficient documentation

## 2017-04-29 DIAGNOSIS — Z7984 Long term (current) use of oral hypoglycemic drugs: Secondary | ICD-10-CM | POA: Insufficient documentation

## 2017-04-29 DIAGNOSIS — Z79899 Other long term (current) drug therapy: Secondary | ICD-10-CM | POA: Insufficient documentation

## 2017-04-29 DIAGNOSIS — E1151 Type 2 diabetes mellitus with diabetic peripheral angiopathy without gangrene: Secondary | ICD-10-CM | POA: Diagnosis not present

## 2017-04-29 DIAGNOSIS — J441 Chronic obstructive pulmonary disease with (acute) exacerbation: Secondary | ICD-10-CM | POA: Diagnosis not present

## 2017-04-29 DIAGNOSIS — I509 Heart failure, unspecified: Secondary | ICD-10-CM | POA: Insufficient documentation

## 2017-04-29 DIAGNOSIS — J449 Chronic obstructive pulmonary disease, unspecified: Secondary | ICD-10-CM | POA: Diagnosis not present

## 2017-04-29 DIAGNOSIS — R0602 Shortness of breath: Secondary | ICD-10-CM | POA: Diagnosis present

## 2017-04-29 DIAGNOSIS — I110 Hypertensive heart disease with heart failure: Secondary | ICD-10-CM | POA: Insufficient documentation

## 2017-04-29 LAB — COMPREHENSIVE METABOLIC PANEL
ALBUMIN: 2.7 g/dL — AB (ref 3.5–5.0)
ALK PHOS: 103 U/L (ref 38–126)
ALT: 24 U/L (ref 17–63)
AST: 20 U/L (ref 15–41)
Anion gap: 10 (ref 5–15)
BUN: 10 mg/dL (ref 6–20)
CALCIUM: 8.9 mg/dL (ref 8.9–10.3)
CO2: 24 mmol/L (ref 22–32)
Chloride: 100 mmol/L — ABNORMAL LOW (ref 101–111)
Creatinine, Ser: 0.76 mg/dL (ref 0.61–1.24)
GFR calc Af Amer: >60 mL/min (ref >60)
GFR calc non Af Amer: >60 mL/min (ref >60)
GLUCOSE: 272 mg/dL — AB (ref 65–99)
Potassium: 3.7 mmol/L (ref 3.5–5.1)
SODIUM: 134 mmol/L — AB (ref 135–145)
Total Bilirubin: 0.3 mg/dL (ref 0.3–1.2)
Total Protein: 6.5 g/dL (ref 6.5–8.1)

## 2017-04-29 LAB — CBC WITH DIFFERENTIAL/PLATELET
BASOS PCT: 0 %
Basophils Absolute: 0 10*3/uL (ref 0.0–0.1)
EOS ABS: 0.2 10*3/uL (ref 0.0–0.7)
Eosinophils Relative: 2 %
HEMATOCRIT: 38.3 % — AB (ref 39.0–52.0)
Hemoglobin: 13.1 g/dL (ref 13.0–17.0)
Lymphocytes Relative: 28 %
Lymphs Abs: 3 10*3/uL (ref 0.7–4.0)
MCH: 29.6 pg (ref 26.0–34.0)
MCHC: 34.2 g/dL (ref 30.0–36.0)
MCV: 86.7 fL (ref 78.0–100.0)
MONO ABS: 0.8 10*3/uL (ref 0.1–1.0)
MONOS PCT: 8 %
Neutro Abs: 6.8 10*3/uL (ref 1.7–7.7)
Neutrophils Relative %: 62 %
Platelets: 282 10*3/uL (ref 150–400)
RBC: 4.42 MIL/uL (ref 4.22–5.81)
RDW: 13.4 % (ref 11.5–15.5)
WBC: 10.8 10*3/uL — ABNORMAL HIGH (ref 4.0–10.5)

## 2017-04-29 LAB — BRAIN NATRIURETIC PEPTIDE: B NATRIURETIC PEPTIDE 5: 40.4 pg/mL (ref 0.0–100.0)

## 2017-04-29 LAB — TROPONIN I: Troponin I: <0.03 ng/mL (ref <0.03)

## 2017-04-29 MED ORDER — PREDNISONE 20 MG PO TABS: 40.0000 mg | ORAL_TABLET | Freq: Every day | ORAL | 0 refills | Status: DC

## 2017-04-29 MED ORDER — ALBUTEROL SULFATE (2.5 MG/3ML) 0.083% IN NEBU
5.0000 mg | INHALATION_SOLUTION | Freq: Once | RESPIRATORY_TRACT | Status: AC
  Administered 2017-04-29: 5 mg via RESPIRATORY_TRACT
  Filled 2017-04-29: qty 6

## 2017-04-29 MED ORDER — METHYLPREDNISOLONE SODIUM SUCC 125 MG IJ SOLR
125.0000 mg | Freq: Once | INTRAMUSCULAR | Status: AC
  Administered 2017-04-29: 125 mg via INTRAVENOUS
  Filled 2017-04-29: qty 2

## 2017-04-29 NOTE — Progress Notes (Signed)
[name] made aware that pt is in need of a new nebulizer at home. [name] will speak with RNCM to see if can [facility]. [name].     Claude MangesKierra S. Sly Parlee, MSW, LCSW-A Emergency Department Clinical Social Worker [phone]

## 2017-04-29 NOTE — Discharge Planning (Signed)
[facility] consulted regarding pt requiring home nebulizer machine.  Met with pt at bedside to find that nebulizer machine (through [facility]) is not working properly.  EDCM contacted [facility] rep; [facility] rep will find out if pt is eligible for replacement or if he needs to take it in for repair.  Will update pt and staff when information becomes available. Shifa Brisbon J. Clydene Laming, [place], [place], [place]

## 2017-04-29 NOTE — Discharge Instructions (Signed)
In addition to the prescribed steroids, please use your albuterol for the next 2 days. Subsequently you may use it as needed.  Return here for concerning changes.

## 2017-04-29 NOTE — ED Triage Notes (Signed)
Pt arrives via [facility] from hotel where he lives. Called out for SOB. On arrival RA sat 92%, pt with audible wheezing, has trach. Denies recent fever, chest pain. Pt with no active distress at present.

## 2017-04-29 NOTE — Telephone Encounter (Signed)
Copied from CRM [id number]. Topic: Quick Communication - See Telephone Encounter >> Apr 29, 2017  5:27 PM Raquel SarnaHayes, Teresa G wrote: [facility] and is needing the paperwork discuss what the hospital was recommending.

## 2017-04-29 NOTE — Discharge Planning (Signed)
[facility] will need to take broken nebulizer machine in office for repair/replacement.  Information relayed to pt.  Pt states he will take it when he "gets home if it's not too late or early in the morning."  No further EDCM needs identified at this time.

## 2017-04-29 NOTE — ED Provider Notes (Signed)
[facility] Provider Note   CSN: [id number] Arrival date & time: 04/29/17  [id number]     History   Chief Complaint Chief Complaint  Patient presents with  . Shortness of Breath    HPI [name] Windt is a 62 y.o. male.  HPI  Patient presents with concern of dyspnea. Patient offers a rambling history of his illness, but it seems as though about 3 days ago he started feeling worse. Concurrently, his home nebulizer machine seemingly was malfunctioning. Over the illness he has become more dyspneic, more fatigued, but without new fever. There is ongoing cough. No other clear alleviating or exacerbating factors besides mild relief with his semi-functional albuterol nebulizer. He notes he has had weight loss, no weight gain, no nausea, vomiting, diarrhea.   Past Medical History:  Diagnosis Date  . Bronchitis   . CHF (congestive heart failure) (HCC)   . COPD (chronic obstructive pulmonary disease) (HCC)   . DM type 2 with diabetic peripheral neuropathy (HCC)   . Hearing loss   . Hypercholesteremia   . Hypertension   . Peripheral neuropathy   . Sleep apnea    Severe sleep apnea requiring tracheostomy    Patient Active Problem List   Diagnosis Date Noted  . SOB (shortness of breath) 09/13/2016  . Peripheral vascular disease (HCC) 08/19/2016  . Tracheostomy care (HCC) 06/28/2015  . Obstructive sleep apnea 06/28/2015  . Chronic pain syndrome 04/18/2015  . Neuropathic pain 04/18/2015  . COPD exacerbation (HCC) 03/21/2015  . Constipation 01/07/2014  . Abdominal pain   . Constipation, chronic 01/04/2014  . Chronic respiratory failure (HCC) 01/06/2013  . Edema 03/16/2012  . Obesity 10/20/2011  . 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 OSA (obstructive sleep apnea) 10/16/2011  . [name] with frequent exacerbations   . CHF (congestive heart failure) (HCC)    . Hypertension   . Hypercholesteremia   . Hearing loss   . Peripheral neuropathy     Past Surgical History:  Procedure Laterality Date  . APPENDECTOMY    . CHOLECYSTECTOMY N/A 01/05/2014   Procedure: LAPAROSCOPIC CHOLECYSTECTOMY;  Surgeon: Emelia LoronMatthew Wakefield, MD;  Location: [facility];  Service: General;  Laterality: N/A;  . FEMUR HARDWARE REMOVAL  09/01/2003   Removal of retained hardware, two interlocking distal femoral  . FEMUR SURGERY    . HAND SURGERY    . MULTIPLE EXTRACTIONS WITH ALVEOLOPLASTY N/A 05/11/2012   Procedure: MULTIPLE EXTRACTION WITH ALVEOLOPLASTY;  Surgeon: Georgia LopesScott M Jensen, DDS;  Location: [facility] OR;  Service: Oral Surgery;  Laterality: N/A;  . TRACHEOSTOMY         Home Medications    Prior to Admission medications   Medication Sig Start Date End Date Taking? Authorizing Provider  albuterol (PROVENTIL) (2.5 MG/3ML) 0.083% nebulizer solution USE ONE VIAL IN NEBULIZER EVERY 6 HOURS AS NEEDED FOR WHEEZING OR SHORTNESS OF BREATH 04/28/17   Valarie ConesWeber, Dema SeverinSarah L, PA-C  amitriptyline (ELAVIL) 25 MG tablet Take 1 tablet (25 mg total) by mouth at bedtime. 08/19/16   Weber, Dema SeverinSarah L, PA-C  atorvastatin (LIPITOR) 20 MG tablet Take 1 tablet (20 mg total) by mouth daily. 03/04/17   Georgina QuintSagardia, Miguel Jose, MD  docusate sodium (COLACE) 100 MG capsule Take 1 capsule (100 mg total) by mouth 2 (two) times daily. 11/28/16   Weber, Dema SeverinSarah L, PA-C  Fluticasone-Salmeterol (ADVAIR DISKUS) 500-50 MCG/DOSE AEPB INHALE 1 PUFF BY MOUTH INTO THE LUNGS TWICE  DAILY 08/04/16   Weber, Dema Severin, PA-C  gabapentin (NEURONTIN) 400 MG capsule TAKE ONE CAPSULE BY MOUTH THREE TIMES DAILY 04/19/17   Georgina Quint, MD  INCRUSE ELLIPTA 62.5 MCG/INH AEPB INHALE 1 PUFF INTO THE LUNGS DAILY 02/20/17   Porfirio Oar, PA-C  metFORMIN (GLUCOPHAGE XR) 500 MG 24 hr tablet Take 2 tablets (1,000 mg total) by mouth 2 (two) times daily. 03/04/17   Georgina Quint, MD  mupirocin ointment (BACTROBAN) 2 % Place 1 application into  the nose 2 (two) times daily. 11/28/16   Weber, Dema Severin, PA-C  polyethylene glycol powder (GLYCOLAX/MIRALAX) powder MIX ONE CAPFFUL IN LIQUID OF CHOICE AND DRINK DAILY AS DIRECTED 11/28/16   Valarie Cones, Dema Severin, PA-C  sitaGLIPtin (JANUVIA) 100 MG tablet Take 1 tablet (100 mg total) by mouth daily. 03/04/17   Georgina Quint, MD  umeclidinium bromide (INCRUSE ELLIPTA) 62.5 MCG/INH AEPB Inhale 1 puff into the lungs daily. 03/04/17   Georgina Quint, MD  VOLTAREN 1 % GEL Apply 2 g topically daily as needed (hand pain).     [provider]    Family History Family History  Problem Relation Age of Onset  . Coronary artery disease Father     Social History Social History   Tobacco Use  . Smoking status: Former Smoker    Packs/day: 2.00    Years: 30.00    Pack years: 60.00    Types: Cigarettes    Last attempt to quit: 07/02/1999    Years since quitting: 17.8  . Smokeless tobacco: Never Used  Substance Use Topics  . Alcohol use: No  . Drug use: No     Allergies   Patient has no known allergies.   Review of Systems Review of Systems  Constitutional:       Per HPI, otherwise negative  HENT:       Per HPI, otherwise negative  Respiratory:       Per HPI, otherwise negative  Cardiovascular:       Per HPI, otherwise negative  Gastrointestinal: Negative for vomiting.  Endocrine:       Negative aside from HPI  Genitourinary:       Neg aside from HPI   Musculoskeletal:       Per HPI, otherwise negative  Skin: Negative.   Neurological: Positive for weakness. Negative for syncope.     Physical Exam Updated Vital Signs BP 139/74   Pulse 89   Temp 98.7 F (37.1 C) (Oral)   Resp 17   SpO2 98%   Physical Exam  Constitutional: He is oriented to person, place, and time. He appears well-developed. No distress.  Obese male with tracheostomy, speaking clearly  HENT:  Head: Normocephalic and atraumatic.  Eyes: Conjunctivae and EOM are normal.  Cardiovascular: Normal  rate and regular rhythm.  Pulmonary/Chest: Effort normal. No stridor. No respiratory distress. He has wheezes. He has rales.  Abdominal: He exhibits no distension.  Musculoskeletal: He exhibits no edema.  Neurological: He is alert and oriented to person, place, and time.  Skin: Skin is warm and dry.  Psychiatric: He has a normal mood and affect.  Nursing note and vitals reviewed.    ED Treatments / Results  Labs (all labs ordered are listed, but only abnormal results are displayed) Labs Reviewed  COMPREHENSIVE METABOLIC PANEL - Abnormal; Notable for the following components:      Result Value   Sodium 134 (*)    Chloride 100 (*)    Glucose,  Bld 272 (*)    Albumin 2.7 (*)    All other components within normal limits  CBC WITH DIFFERENTIAL/PLATELET - Abnormal; Notable for the following components:   WBC 10.8 (*)    HCT 38.3 (*)    All other components within normal limits  TROPONIN I  BRAIN NATRIURETIC PEPTIDE    EKG  EKG Interpretation  Date/Time:  Wednesday April 29 2017 09:43:33 EST Ventricular Rate:  94 PR Interval:    QRS Duration: 119 QT Interval:  383 QTC Calculation: 479 R Axis:   -16 Text Interpretation:  Sinus rhythm Incomplete RBBB and LAFB ST-t wave abnormality No significant change since last tracing Abnormal ekg Confirmed by Gerhard Munch [phone]) on 04/29/2017 9:52:30 AM       Radiology Dg Chest 2 View  Result Date: 04/29/2017 CLINICAL DATA:  Shortness of breath, wheezing, decreased oxygen saturation. Tracheostomy patient. Recent episodes of fever and chest pain. History of CHF and COPD. EXAM: CHEST  2 VIEW COMPARISON:  Chest x-ray of March 30, 2015 FINDINGS: The lungs are well-expanded. There is no focal infiltrate. There is no pleural effusion. The heart is top-normal in size. The pulmonary vascularity is not engorged. There is calcification in the wall of the aortic arch. The tracheostomy tube tip projects at the inferior margin of the clavicular  heads. IMPRESSION: Chronic bronchitic changes. No pneumonia, CHF, nor other acute cardiopulmonary abnormality. Thoracic aortic atherosclerosis. Electronically Signed   By: David  [place] M.D.   On: 04/29/2017 10:44    Procedures Procedures (including critical care time)  Medications Ordered in ED Medications  albuterol (PROVENTIL) (2.5 MG/3ML) 0.083% nebulizer solution 5 mg (5 mg Nebulization Given 04/29/17 1035)  methylPREDNISolone sodium succinate (SOLU-MEDROL) 125 mg/2 mL injection 125 mg (125 mg Intravenous Given 04/29/17 1035)     Initial Impression / Assessment and Plan / ED Course  I have reviewed the triage vital signs and the nursing notes.  Pertinent labs & imaging results that were available during my care of the patient were reviewed by me and considered in my medical decision making (see chart for details).   Update:, Patient improved after albuterol.  2:56 PM On repeat exam patient is awake and alert, no distress. After discussing the patient's situation with our case management team, they have arranged to work with him and his family to replace his nebulizer machine. He remains awake alert, no distress, improved after initial interventions including albuterol and methylprednisolone.  With no evidence for pneumonia, no evidence for CHF, no evidence for infection, and with improvement here, there is suspicion for COPD exacerbation. Patient approved for discharge with ongoing steroid therapy, home albuterol sessions.  Final Clinical Impressions(s) / ED Diagnoses   Final diagnoses:  COPD exacerbation [facility])    ED Discharge Orders        Ordered    predniSONE (DELTASONE) 20 MG tablet  Daily with breakfast     04/29/17 1458       Gerhard Munch, MD 04/29/17 1458

## 2017-05-07 ENCOUNTER — Telehealth: Payer: Self-pay

## 2017-05-07 NOTE — Telephone Encounter (Signed)
PA started for Spiriva 18 mcg Caps 30s & Handihaler  Awaiting Response Contact plan to follow up on [id number]    Bin# [id number] ID [id number] Group [id number]

## 2017-05-09 ENCOUNTER — Telehealth: Payer: Self-pay | Admitting: Physician Assistant

## 2017-05-09 ENCOUNTER — Other Ambulatory Visit: Payer: Self-pay | Admitting: Physician Assistant

## 2017-05-09 DIAGNOSIS — J449 Chronic obstructive pulmonary disease, unspecified: Secondary | ICD-10-CM

## 2017-05-09 DIAGNOSIS — K5903 Drug induced constipation: Secondary | ICD-10-CM

## 2017-05-09 DIAGNOSIS — J4489 Other specified chronic obstructive pulmonary disease: Secondary | ICD-10-CM

## 2017-05-09 NOTE — Telephone Encounter (Signed)
[facility] called stating that pt is petrified because he is afraid he is going to run out of his albuterol viles for his nebulizer. The nurse I spoke with stated that he believed he had enough for today but definitely not enough for tomorrow. She said they were advised to call the provider or doctor on call but did not have a number listed for them. Please advise.

## 2017-05-11 NOTE — Telephone Encounter (Signed)
LOV 03/04/2017 with Dr. Alvy BimlerSagardia / Albuterol was discussed in this office note / Will refill per protocol / Will route refill for Miralax to provider for revie

## 2017-05-11 NOTE — Telephone Encounter (Signed)
Left message for patient to check with his pharmacy to make sure they have his medication- it was sent over this morning.

## 2017-05-17 ENCOUNTER — Other Ambulatory Visit: Payer: Self-pay

## 2017-05-17 ENCOUNTER — Encounter (HOSPITAL_COMMUNITY): Payer: Self-pay

## 2017-05-17 DIAGNOSIS — Z430 Encounter for attention to tracheostomy: Secondary | ICD-10-CM | POA: Insufficient documentation

## 2017-05-17 DIAGNOSIS — E119 Type 2 diabetes mellitus without complications: Secondary | ICD-10-CM | POA: Insufficient documentation

## 2017-05-17 DIAGNOSIS — Z79899 Other long term (current) drug therapy: Secondary | ICD-10-CM | POA: Insufficient documentation

## 2017-05-17 DIAGNOSIS — Z7984 Long term (current) use of oral hypoglycemic drugs: Secondary | ICD-10-CM | POA: Insufficient documentation

## 2017-05-17 DIAGNOSIS — I509 Heart failure, unspecified: Secondary | ICD-10-CM | POA: Insufficient documentation

## 2017-05-17 DIAGNOSIS — Z87891 Personal history of nicotine dependence: Secondary | ICD-10-CM | POA: Insufficient documentation

## 2017-05-17 DIAGNOSIS — R0602 Shortness of breath: Secondary | ICD-10-CM | POA: Insufficient documentation

## 2017-05-17 DIAGNOSIS — J449 Chronic obstructive pulmonary disease, unspecified: Secondary | ICD-10-CM | POA: Insufficient documentation

## 2017-05-17 DIAGNOSIS — I10 Essential (primary) hypertension: Secondary | ICD-10-CM | POA: Diagnosis not present

## 2017-05-17 NOTE — ED Triage Notes (Signed)
Pt BIB [facility] from the hotel where he lives. Pt requesting his trach to be changed. He is talking on the phone during triage in no distress. A&Ox4.

## 2017-05-18 ENCOUNTER — Emergency Department (HOSPITAL_COMMUNITY)
Admission: EM | Admit: 2017-05-18 | Discharge: 2017-05-18 | Disposition: A | Discharge status: Home / Self Care | Payer: Medicare Other | Attending: Emergency Medicine | Admitting: Emergency Medicine

## 2017-05-18 DIAGNOSIS — J9503 Malfunction of tracheostomy stoma: Secondary | ICD-10-CM

## 2017-05-18 DIAGNOSIS — Z430 Encounter for attention to tracheostomy: Secondary | ICD-10-CM | POA: Diagnosis not present

## 2017-05-18 MED ORDER — ALBUTEROL SULFATE (2.5 MG/3ML) 0.083% IN NEBU
5.0000 mg | INHALATION_SOLUTION | Freq: Once | RESPIRATORY_TRACT | Status: AC
  Administered 2017-05-18: 5 mg via RESPIRATORY_TRACT
  Filled 2017-05-18: qty 6

## 2017-05-18 NOTE — Progress Notes (Signed)
RT replaced Micah NoelJackson Trach 7.0 with same brand and size, Pt tolerated well.  BBS present and equal, no apparent complications noted.  RT to monitor and assess as needed.

## 2017-05-18 NOTE — ED Provider Notes (Signed)
[facility]-EMERGENCY DEPT Provider Note   CSN: [id number] Arrival date & time: 05/17/17  2136     History   Chief Complaint Chief Complaint  Patient presents with  . Tracheostomy Tube Change    HPI Tyrone Cunningham is a 62 y.o. male.  The history is provided by the patient.   Patient presents for tracheostomy change.  Patient with history of obesity, CHF, COPD, sleep apnea requiring trach.  He reports he cannot recall the last time he had a trach change.  There is been no abrupt change tonight.  No fever/drainage or bleeding from the trach.  He reports mild shortness of breath, no chest pain.  Nothing worsens his symptoms.  Past Medical History:  Diagnosis Date  . Bronchitis   . CHF (congestive heart failure) (HCC)   . COPD (chronic obstructive pulmonary disease) (HCC)   . DM type 2 with diabetic peripheral neuropathy (HCC)   . Hearing loss   . Hypercholesteremia   . Hypertension   . Peripheral neuropathy   . Sleep apnea    Severe sleep apnea requiring tracheostomy    Patient Active Problem List   Diagnosis Date Noted  . SOB (shortness of breath) 09/13/2016  . Peripheral vascular disease (HCC) 08/19/2016  . Tracheostomy care (HCC) 06/28/2015  . Obstructive sleep apnea 06/28/2015  . Chronic pain syndrome 04/18/2015  . Neuropathic pain 04/18/2015  . COPD exacerbation (HCC) 03/21/2015  . Constipation 01/07/2014  . Abdominal pain   . Constipation, chronic 01/04/2014  . Chronic respiratory failure (HCC) 01/06/2013  . Edema 03/16/2012  . Obesity 10/20/2011  . 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 OSA (obstructive sleep apnea) 10/16/2011  . [name] with frequent exacerbations   . CHF (congestive heart failure) (HCC)   . Hypertension   . Hypercholesteremia   . Hearing loss   . Peripheral neuropathy     Past Surgical History:  Procedure Laterality Date  .  APPENDECTOMY    . CHOLECYSTECTOMY N/A 01/05/2014   Procedure: LAPAROSCOPIC CHOLECYSTECTOMY;  Surgeon: Emelia LoronMatthew Wakefield, MD;  Location: [facility];  Service: General;  Laterality: N/A;  . FEMUR HARDWARE REMOVAL  09/01/2003   Removal of retained hardware, two interlocking distal femoral  . FEMUR SURGERY    . HAND SURGERY    . MULTIPLE EXTRACTIONS WITH ALVEOLOPLASTY N/A 05/11/2012   Procedure: MULTIPLE EXTRACTION WITH ALVEOLOPLASTY;  Surgeon: Georgia LopesScott M Jensen, DDS;  Location: [facility] OR;  Service: Oral Surgery;  Laterality: N/A;  . TRACHEOSTOMY         Home Medications    Prior to Admission medications   Medication Sig Start Date End Date Taking? Authorizing Provider  albuterol (PROVENTIL) (2.5 MG/3ML) 0.083% nebulizer solution USE ONE VIAL VIA NEBULIZER EVERY 6 HOURS AS NEEDED FOR WHEEZING OR SHORTNESS OF BREATH 05/11/17  Yes Sagardia, Eilleen KempfMiguel Jose, MD  atorvastatin (LIPITOR) 20 MG tablet Take 1 tablet (20 mg total) by mouth daily. 03/04/17  Yes Sagardia, Eilleen KempfMiguel Jose, MD  gabapentin (NEURONTIN) 400 MG capsule TAKE ONE CAPSULE BY MOUTH THREE TIMES DAILY 04/19/17  Yes Georgina QuintSagardia, Miguel Jose, MD  metFORMIN (GLUCOPHAGE XR) 500 MG 24 hr tablet Take 2 tablets (1,000 mg total) by mouth 2 (two) times daily. 03/04/17  Yes Sagardia, Eilleen KempfMiguel Jose, MD  Morphine-Naltrexone (EMBEDA) 30-1.2 MG CPCR Take 1 capsule by mouth 2 (two) times daily.    Yes [provider]  polyethylene glycol (MIRALAX / GLYCOLAX) packet Take 17  g by mouth daily as needed for moderate constipation.   Yes [provider]  sitaGLIPtin (JANUVIA) 100 MG tablet Take 1 tablet (100 mg total) by mouth daily. 03/04/17  Yes Sagardia, Eilleen Kempf, MD  traMADol (ULTRAM) 50 MG tablet Take 50 mg by mouth every 6 (six) hours as needed for moderate pain.   Yes [provider]  umeclidinium bromide (INCRUSE ELLIPTA) 62.5 MCG/INH AEPB Inhale 1 puff into the lungs daily. 03/04/17  Yes Sagardia, Eilleen Kempf, MD  amitriptyline (ELAVIL) 25 MG tablet  Take 1 tablet (25 mg total) by mouth at bedtime. Patient not taking: Reported on 05/18/2017 08/19/16   Valarie Cones, Dema Severin, PA-C  docusate sodium (COLACE) 100 MG capsule Take 1 capsule (100 mg total) by mouth 2 (two) times daily. Patient not taking: Reported on 05/18/2017 11/28/16   Valarie Cones, Dema Severin, PA-C  Fluticasone-Salmeterol (ADVAIR DISKUS) 500-50 MCG/DOSE AEPB INHALE 1 PUFF BY MOUTH INTO THE LUNGS TWICE DAILY Patient not taking: Reported on 05/18/2017 08/04/16   Valarie Cones, Dema Severin, PA-C  INCRUSE ELLIPTA 62.5 MCG/INH AEPB INHALE 1 PUFF INTO THE LUNGS DAILY Patient not taking: Reported on 05/18/2017 02/20/17   Porfirio Oar, PA-C  mupirocin ointment (BACTROBAN) 2 % Place 1 application into the nose 2 (two) times daily. Patient not taking: Reported on 05/18/2017 11/28/16   Weber, Dema Severin, PA-C  polyethylene glycol powder (GLYCOLAX/MIRALAX) powder MIX ONE CAPFFUL IN LIQUID OF CHOICE AND DRINK DAILY AS DIRECTED Patient not taking: Reported on 05/18/2017 11/28/16   Valarie Cones, Dema Severin, PA-C  predniSONE (DELTASONE) 20 MG tablet Take 2 tablets (40 mg total) by mouth daily with breakfast. For the next four days Patient not taking: Reported on 05/18/2017 04/29/17   Gerhard Munch, MD    Family History Family History  Problem Relation Age of Onset  . Coronary artery disease Father     Social History Social History   Tobacco Use  . Smoking status: Former Smoker    Packs/day: 2.00    Years: 30.00    Pack years: 60.00    Types: Cigarettes    Last attempt to quit: 07/02/1999    Years since quitting: 17.8  . Smokeless tobacco: Never Used  Substance Use Topics  . Alcohol use: No  . Drug use: No     Allergies   Patient has no known allergies.   Review of Systems Review of Systems  Constitutional: Negative for fever.  Respiratory: Positive for shortness of breath.   Cardiovascular: Negative for chest pain.  Gastrointestinal: Negative for vomiting.  All other systems reviewed and are negative.    Physical  Exam Updated Vital Signs BP (!) 201/125   Pulse (!) 123   Resp (!) 22   SpO2 93%   Physical Exam CONSTITUTIONAL: Well developed/well nourished HEAD: Normocephalic/atraumatic EYES: EOMI/PERRL ENMT: Mucous membranes moist, Jackson trach in place, no blood or discharge noted NECK: supple no meningeal signs CV: S1/S2 noted, no murmurs/rubs/gallops noted LUNGS: Scattered wheezing noted, no apparent distress ABDOMEN: soft, obese NEURO: Pt is awake/alert/appropriate, moves all extremitiesx4.   EXTREMITIES: pulses normal/equal, full ROM SKIN: warm, color normal PSYCH: no abnormalities of mood noted, alert and oriented to situation   ED Treatments / Results  Labs (all labs ordered are listed, but only abnormal results are displayed) Labs Reviewed - No data to display  EKG  EKG Interpretation None       Radiology No results found.  Procedures Procedures (including critical care time)  Medications Ordered in ED Medications  albuterol (PROVENTIL) (  2.5 MG/3ML) 0.083% nebulizer solution 5 mg (5 mg Nebulization Given 05/18/17 0406)     Initial Impression / Assessment and Plan / ED Course  I have reviewed the triage vital signs and the nursing notes.       Patient comes tonight to have his trach changed.  There is been no acute change other than the fact that is been well over a year since he had a change.  Per records, the last time he saw ear nose and throat was in 2017. 5:31 AM Patient stable.  Janina Mayo is been changed by respiratory therapist.  He is in no distress.  No new complications I advised he is to follow-up with ENT   Final Clinical Impressions(s) / ED Diagnoses   Final diagnoses:  Tracheostomy malfunction [facility])    ED Discharge Orders    None       Zadie Rhine, MD 05/18/17 [phone]

## 2017-05-26 ENCOUNTER — Encounter: Payer: Self-pay | Admitting: Physician Assistant

## 2017-05-26 ENCOUNTER — Other Ambulatory Visit: Payer: Self-pay

## 2017-05-26 ENCOUNTER — Ambulatory Visit (INDEPENDENT_AMBULATORY_CARE_PROVIDER_SITE_OTHER): Payer: Medicare Other | Admitting: Physician Assistant

## 2017-05-26 VITALS — BP 110/68 | HR 124 | Temp 98.7°F | Resp 18 | Ht 68.0 in | Wt 254.0 lb

## 2017-05-26 DIAGNOSIS — Z930 Tracheostomy status: Secondary | ICD-10-CM

## 2017-05-26 DIAGNOSIS — E7800 Pure hypercholesterolemia, unspecified: Secondary | ICD-10-CM | POA: Diagnosis not present

## 2017-05-26 DIAGNOSIS — E119 Type 2 diabetes mellitus without complications: Secondary | ICD-10-CM | POA: Diagnosis not present

## 2017-05-26 DIAGNOSIS — K5903 Drug induced constipation: Secondary | ICD-10-CM | POA: Diagnosis not present

## 2017-05-26 DIAGNOSIS — K5909 Other constipation: Secondary | ICD-10-CM | POA: Diagnosis not present

## 2017-05-26 DIAGNOSIS — J4489 Other specified chronic obstructive pulmonary disease: Secondary | ICD-10-CM

## 2017-05-26 DIAGNOSIS — G894 Chronic pain syndrome: Secondary | ICD-10-CM

## 2017-05-26 DIAGNOSIS — Z91120 Patient's intentional underdosing of medication regimen due to financial hardship: Secondary | ICD-10-CM

## 2017-05-26 DIAGNOSIS — I10 Essential (primary) hypertension: Secondary | ICD-10-CM

## 2017-05-26 DIAGNOSIS — Z598 Other problems related to housing and economic circumstances: Secondary | ICD-10-CM | POA: Diagnosis not present

## 2017-05-26 DIAGNOSIS — J449 Chronic obstructive pulmonary disease, unspecified: Secondary | ICD-10-CM | POA: Diagnosis not present

## 2017-05-26 DIAGNOSIS — Z5989 Other problems related to housing and economic circumstances: Secondary | ICD-10-CM

## 2017-05-26 DIAGNOSIS — Z993 Dependence on wheelchair: Secondary | ICD-10-CM

## 2017-05-26 DIAGNOSIS — Z599 Problem related to housing and economic circumstances, unspecified: Secondary | ICD-10-CM

## 2017-05-26 DIAGNOSIS — M792 Neuralgia and neuritis, unspecified: Secondary | ICD-10-CM

## 2017-05-26 MED ORDER — METFORMIN HCL ER 500 MG PO TB24: 1000.0000 mg | ORAL_TABLET | Freq: Two times a day (BID) | ORAL | 1 refills | Status: DC

## 2017-05-26 MED ORDER — DOCUSATE SODIUM 100 MG PO CAPS: 100.0000 mg | ORAL_CAPSULE | Freq: Two times a day (BID) | ORAL | 1 refills | Status: DC

## 2017-05-26 MED ORDER — ATORVASTATIN CALCIUM 20 MG PO TABS: 20.0000 mg | ORAL_TABLET | Freq: Every day | ORAL | 3 refills | Status: DC

## 2017-05-26 MED ORDER — ALBUTEROL SULFATE (2.5 MG/3ML) 0.083% IN NEBU: 2.5000 mg | INHALATION_SOLUTION | Freq: Four times a day (QID) | RESPIRATORY_TRACT | 3 refills | Status: DC

## 2017-05-26 MED ORDER — SITAGLIPTIN PHOSPHATE 100 MG PO TABS: 100.0000 mg | ORAL_TABLET | Freq: Every day | ORAL | 0 refills | Status: DC

## 2017-05-26 MED ORDER — UMECLIDINIUM BROMIDE 62.5 MCG/INH IN AEPB: 1.0000 | INHALATION_SPRAY | Freq: Every day | RESPIRATORY_TRACT | 1 refills | Status: DC

## 2017-05-26 NOTE — Patient Instructions (Addendum)
  Independent living  Assisted living  Skilled facility   IF you received an x-ray today, you will receive an invoice from [facility]. Please contact [facility] at [phone] with questions or concerns regarding your invoice.   IF you received labwork today, you will receive an invoice from [facility]. Please contact [facility] at [phone] with questions or concerns regarding your invoice.   Our billing staff will not be able to assist you with questions regarding bills from these companies.  You will be contacted with the lab results as soon as they are available. The fastest way to get your results is to activate your My Chart account. Instructions are located on the last page of this paperwork. If you have not heard from us regarding the results in 2 weeks, please contact this office.

## 2017-05-26 NOTE — Progress Notes (Signed)
Tyrone Cunningham  MRN: [id number] DOB: November 23, 1955  PCP: Mancel Bale, PA-C  Chief Complaint  Patient presents with  . Medication Refill    follow up     Subjective:  Pt presents to clinic for medication refills.  Electric chair is broken - has been broken for about a month but [facility] is supposed to be working on it.  Brother rented him a Educational psychologist.  Just had trache fixed and then replaced - working well now.  Uses public transportation or [facility] to get places.  Living with a different roommate - safer situation and more stable but not sure how long it will last - maybe looking into residential facilities that will help with his medication care - not sure where to start.  Albuterol neb - 3-4 times a day gives him relief - does not always use mainly when he is low on medications.  Not sure if he takes his DM medications - he will not use insulin and he is aware of the risks of uncontrolled DM.  Does not always take gabapentin - does not always have his medications  Does not eat some of his meals due to low money - he has had meals delivered to him in the past but he is not sure where they came from. We tried him on elavil but he is not currently taking - he is not sure why but he does not want to take  History is obtained by patient and brother.  Review of Systems  Constitutional: Negative for chills and fever.  Respiratory: Positive for shortness of breath (normal for him).   Neurological:       Feet paresthesias - no change     Patient Active Problem List   Diagnosis Date Noted  . SOB (shortness of breath) 09/13/2016  . Peripheral vascular disease ([place]) 08/19/2016  . Tracheostomy care ([place]) 06/28/2015  . Obstructive sleep apnea 06/28/2015  . Chronic pain syndrome 04/18/2015  . Neuropathic pain 04/18/2015  . COPD exacerbation ([place]) 03/21/2015  . Constipation 01/07/2014  . Abdominal pain   . Constipation, chronic 01/04/2014  . Chronic respiratory failure  ([place]) 01/06/2013  . Edema 03/16/2012  . Obesity 10/20/2011  . DM (diabetes mellitus), type 2 with neurological complications ([place]) 22/48/2500  . 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 OSA (obstructive sleep apnea) 10/16/2011  . [name] with frequent exacerbations   . CHF (congestive heart failure) ([place])   . Hypertension   . Hypercholesteremia   . Hearing loss   . Peripheral neuropathy     Current Outpatient Medications on File Prior to Visit  Medication Sig Dispense Refill  . gabapentin (NEURONTIN) 400 MG capsule TAKE ONE CAPSULE BY MOUTH THREE TIMES DAILY 90 capsule 0  . traMADol (ULTRAM) 50 MG tablet Take 50 mg by mouth every 6 (six) hours as needed for moderate pain.    Marland Kitchen Morphine-Naltrexone (EMBEDA) 30-1.2 MG CPCR Take 1 capsule by mouth 2 (two) times daily.      No current facility-administered medications on file prior to visit.     No Known Allergies  Past Medical History:  Diagnosis Date  . Bronchitis   . CHF (congestive heart failure) ([place])   . COPD (chronic obstructive pulmonary disease) ([place])   . DM type 2 with diabetic peripheral neuropathy ([place])   . Hearing loss   . Hypercholesteremia   . Hypertension   . Peripheral neuropathy   . Sleep apnea    Severe sleep  apnea requiring tracheostomy   Social History   Social History Narrative   Lives with girlfriend in a hotel.   In wheelchair - minimal walking   Social History   Tobacco Use  . Smoking status: Former Smoker    Packs/day: 2.00    Years: 30.00    Pack years: 60.00    Types: Cigarettes    Last attempt to quit: 07/02/1999    Years since quitting: 17.9  . Smokeless tobacco: Never Used  Substance Use Topics  . Alcohol use: No  . Drug use: No   family history includes Coronary artery disease in his father.     Objective:  BP 110/68   Pulse (!) 124   Temp 98.7 F (37.1 C) (Oral)   Resp 18   Ht 5' 8"  (1.727 m)   Wt 254 lb (115.2 kg)   SpO2 97%   BMI 38.62 kg/m  Body mass index is  38.62 kg/m.  Physical Exam  Constitutional: He is oriented to person, place, and time and well-developed, well-nourished, and in no distress.  HENT:  Head: Normocephalic and atraumatic.  Right Ear: External ear normal.  Left Ear: External ear normal.  Eyes: Conjunctivae are normal.  Neck: Normal range of motion.  Cardiovascular: Normal rate, regular rhythm and normal heart sounds.  No murmur heard. Pulmonary/Chest: Effort normal. He has wheezes (mild).  Decreased breath sounds - long expiration compared with inspiration  Neurological: He is alert and oriented to person, place, and time.  in wheelchair   Skin: Skin is warm and dry.  Psychiatric: Mood, memory, affect and judgment normal.    Assessment and Plan :  Type 2 diabetes mellitus without complication, without long-term current use of insulin (HCC) - Plan: CMP14+EGFR, Hemoglobin A1c, sitaGLIPtin (JANUVIA) 100 MG tablet, metFORMIN (GLUCOPHAGE XR) 500 MG 24 hr tablet, Microalbumin, urine, Ambulatory referral to Social Work- pt is not willing to use insulin - he has not had labs in 9 months- he is not sure that he has been using these medications but he is willing to try if he can afford them  [name] with frequent exacerbations - Plan: albuterol (PROVENTIL) (2.5 MG/3ML) 0.083% nebulizer solution, umeclidinium bromide (INCRUSE ELLIPTA) 62.5 MCG/INH AEPB, Ambulatory referral to Social Work - continue treatment - would be helpful if he would go to pulmonology but at this time he is not   Drug-induced constipation - Plan: docusate sodium (COLACE) 100 MG capsule - continue daily use  Elevated cholesterol - Plan: CMP14+EGFR, Lipid panel, atorvastatin (LIPITOR) 20 MG tablet - pt is willing to use this medication - we will check labs and continue  Patient's intentional underdosing of medication regimen due to financial hardship - he is not sure which medications he uses - he cannot always afford them all at one  Financial difficulties  - Plan: Ambulatory referral to Social Work  Living accommodation issues - Plan: Ambulatory referral to Social Work  Independent in wheelchair - Plan: Ambulatory referral to Social Work  Chronic pain - continue with pain management  Dm neuropathy - continue gabapentin  Windell Hummingbird PA-C  [facility] 05/29/2017 8:28 AM

## 2017-05-27 LAB — LIPID PANEL
Chol/HDL Ratio: 3.6 ratio (ref 0.0–5.0)
Cholesterol, Total: 171 mg/dL (ref 100–199)
HDL: 47 mg/dL (ref >39)
LDL Calculated: 99 mg/dL (ref 0–99)
Triglycerides: 123 mg/dL (ref 0–149)
VLDL Cholesterol Cal: 25 mg/dL (ref 5–40)

## 2017-05-27 LAB — CMP14+EGFR
A/G RATIO: 1.4 (ref 1.2–2.2)
ALK PHOS: 228 IU/L — AB (ref 39–117)
ALT: 47 IU/L — AB (ref 0–44)
AST: 36 IU/L (ref 0–40)
Albumin: 3.5 g/dL — ABNORMAL LOW (ref 3.6–4.8)
BILIRUBIN TOTAL: 0.2 mg/dL (ref 0.0–1.2)
BUN/Creatinine Ratio: 22 (ref 10–24)
BUN: 16 mg/dL (ref 8–27)
CHLORIDE: 96 mmol/L (ref 96–106)
CO2: 28 mmol/L (ref 20–29)
Calcium: 9.3 mg/dL (ref 8.6–10.2)
Creatinine, Ser: 0.74 mg/dL — ABNORMAL LOW (ref 0.76–1.27)
GFR calc non Af Amer: 98 mL/min/{1.73_m2} (ref >59)
GFR, EST AFRICAN AMERICAN: 113 mL/min/{1.73_m2} (ref >59)
Globulin, Total: 2.5 g/dL (ref 1.5–4.5)
Glucose: 219 mg/dL — ABNORMAL HIGH (ref 65–99)
POTASSIUM: 4.5 mmol/L (ref 3.5–5.2)
Sodium: 137 mmol/L (ref 134–144)
TOTAL PROTEIN: 6 g/dL (ref 6.0–8.5)

## 2017-05-27 LAB — HEMOGLOBIN A1C
Est. average glucose Bld gHb Est-mCnc: 200 mg/dL
HEMOGLOBIN A1C: 8.6 % — AB (ref 4.8–5.6)

## 2017-05-27 LAB — MICROALBUMIN, URINE: Microalbumin, Urine: 5573.1 ug/mL

## 2017-05-29 ENCOUNTER — Encounter: Payer: Self-pay | Admitting: Physician Assistant

## 2017-05-29 MED ORDER — GABAPENTIN 400 MG PO CAPS: 400.0000 mg | ORAL_CAPSULE | Freq: Three times a day (TID) | ORAL | 0 refills | Status: DC

## 2017-06-02 ENCOUNTER — Ambulatory Visit: Payer: Medicare Other | Admitting: Physician Assistant

## 2017-06-08 ENCOUNTER — Ambulatory Visit: Payer: Medicare Other | Admitting: Emergency Medicine

## 2017-06-10 ENCOUNTER — Encounter: Payer: Self-pay | Admitting: Emergency Medicine

## 2017-06-10 ENCOUNTER — Ambulatory Visit (INDEPENDENT_AMBULATORY_CARE_PROVIDER_SITE_OTHER): Payer: Medicare Other | Admitting: Emergency Medicine

## 2017-06-10 ENCOUNTER — Other Ambulatory Visit: Payer: Self-pay | Admitting: Emergency Medicine

## 2017-06-10 ENCOUNTER — Other Ambulatory Visit: Payer: Self-pay

## 2017-06-10 VITALS — BP 151/88 | HR 94 | Temp 98.6°F | Resp 16

## 2017-06-10 DIAGNOSIS — E1149 Type 2 diabetes mellitus with other diabetic neurological complication: Secondary | ICD-10-CM | POA: Diagnosis not present

## 2017-06-10 DIAGNOSIS — M792 Neuralgia and neuritis, unspecified: Secondary | ICD-10-CM | POA: Diagnosis not present

## 2017-06-10 DIAGNOSIS — G894 Chronic pain syndrome: Secondary | ICD-10-CM

## 2017-06-10 MED ORDER — DICLOFENAC SODIUM 75 MG PO TBEC: 75.0000 mg | DELAYED_RELEASE_TABLET | Freq: Two times a day (BID) | ORAL | 0 refills | Status: AC

## 2017-06-10 NOTE — Patient Instructions (Addendum)
Must follow-up with your pain management doctor for further treatment please.   IF you received an x-ray today, you will receive an invoice from [facility]. Please contact [facility] at [phone] with questions or concerns regarding your invoice.   IF you received labwork today, you will receive an invoice from [facility]. Please contact [facility] at [phone] with questions or concerns regarding your invoice.   Our billing staff will not be able to assist you with questions regarding bills from these companies.  You will be contacted with the lab results as soon as they are available. The fastest way to get your results is to activate your My Chart account. Instructions are located on the last page of this paperwork. If you have not heard from [place] regarding the results in 2 weeks, please contact this office.     Chronic Pain, Adult Chronic pain is a type of pain that lasts or keeps coming back (recurs) for at least six months. You may have chronic headaches, abdominal pain, or body pain. Chronic pain may be related to an illness, such as fibromyalgia or complex regional pain syndrome. Sometimes the cause of chronic pain is not known. Chronic pain can make it hard for you to do daily activities. If not treated, chronic pain can lead to other health problems, including anxiety and depression. Treatment depends on the cause and severity of your pain. You may need to work with a pain specialist to come up with a treatment plan. The plan may include medicine, counseling, and physical therapy. Many people benefit from a combination of two or more types of treatment to control their pain. Follow these instructions at home: Lifestyle  Consider keeping a pain diary to share with your health care providers.  Consider talking with a mental health care provider (psychologist) about how to cope with chronic pain.  Consider joining a chronic pain support group.  Try to control or  lower your stress levels. Talk to your health care provider about strategies to do this. General instructions   Take over-the-counter and prescription medicines only as told by your health care provider.  Follow your treatment plan as told by your health care provider. This may include: ? Gentle, regular exercise. ? Eating a healthy diet that includes foods such as vegetables, fruits, fish, and lean meats. ? Cognitive or behavioral therapy. ? Working with a Adult nurse. ? Meditation or yoga. ? Acupuncture or massage therapy. ? Aroma, color, light, or sound therapy. ? Local electrical stimulation. ? Shots (injections) of numbing or pain-relieving medicines into the spine or the area of pain.  Check your pain level as told by your health care provider. Ask your health care provider if you should use a pain scale.  Learn as much as you can about how to manage your chronic pain. Ask your health care provider if an intensive pain rehabilitation program or a chronic pain specialist would be helpful.  Keep all follow-up visits as told by your health care provider. This is important. Contact a health care provider if:  Your pain gets worse.  You have new pain.  You have trouble sleeping.  You have trouble doing your normal activities.  Your pain is not controlled with treatment.  Your have side effects from pain medicine.  You feel weak. Get help right away if:  You lose feeling or have numbness in your body.  You lose control of bowel or bladder function.  Your pain suddenly gets much worse.  You develop  shaking or chills.  You develop confusion.  You develop chest pain.  You have trouble breathing or shortness of breath.  You pass out.  You have thoughts about hurting yourself or others. This information is not intended to replace advice given to you by your health care provider. Make sure you discuss any questions you have with your health care  provider. Document Released: 11/09/2001 Document Revised: 10/18/2015 Document Reviewed: 08/07/2015 Elsevier Interactive Patient Education  2018 [employer].  Diabetic Neuropathy Diabetic neuropathy is a nerve disease or nerve damage that is caused by diabetes mellitus. About half of all people with diabetes mellitus have some form of nerve damage. Nerve damage is more common in those who have had diabetes mellitus for many years and who generally have not had good control of their blood sugar (glucose) level. Diabetic neuropathy is a common complication of diabetes mellitus. There are three common types of diabetic neuropathy and a fourth type that is less common and less understood:  Peripheral neuropathy-This is the most common type of diabetic neuropathy. It causes damage to the nerves of the feet and legs first and then eventually the hands and arms. The damage affects the ability to sense touch.  Autonomic neuropathy-This type causes damage to the autonomic nervous system, which controls the following functions: ? Heartbeat. ? Body temperature. ? Blood pressure. ? Urination. ? Digestion. ? Sweating. ? Sexual function.  Focal neuropathy-Focal neuropathy can be painful and unpredictable and occurs most often in older adults with diabetes mellitus. It involves a specific nerve or one area and often comes on suddenly. It usually does not cause long-term problems.  Radiculoplexus neuropathy- Sometimes called lumbosacral radiculoplexus neuropathy, radiculoplexus neuropathy affects the nerves of the thighs, hips, buttocks, or legs. It is more common in people with type 2 diabetes mellitus and in older men. It is characterized by debilitating pain, weakness, and atrophy, usually in the thigh muscles.  What are the causes? The cause of peripheral, autonomic, and focal neuropathies is diabetes mellitus that is uncontrolled and high glucose levels. The cause of radiculoplexus neuropathy is  unknown. However, it is thought to be caused by inflammation related to uncontrolled glucose levels. What are the signs or symptoms? Peripheral Neuropathy Peripheral neuropathy develops slowly over time. When the nerves of the feet and legs no longer work there may be:  Burning, stabbing, or aching pain in the legs or feet.  Inability to feel pressure or pain in your feet. This can lead to: ? Thick calluses over pressure areas. ? Pressure sores. ? Ulcers.  Foot deformities.  Reduced ability to feel temperature changes.  Muscle weakness.  Autonomic Neuropathy The symptoms of autonomic neuropathy vary depending on which nerves are affected. Symptoms may include:  Problems with digestion, such as: ? Feeling sick to your stomach (nausea). ? Vomiting. ? Bloating. ? Constipation. ? Diarrhea. ? Abdominal pain.  Difficulty with urination. This occurs if you lose your ability to sense when your bladder is full. Problems include: ? Urine leakage (incontinence). ? Inability to empty your bladder completely (retention).  Rapid or irregular heartbeat (palpitations).  Blood pressure drops when you stand up (orthostatic hypotension). When you stand up you may feel: ? Dizzy. ? Weak. ? Faint.  In men, inability to attain and maintain an erection.  In women, vaginal dryness and problems with decreased sexual desire and arousal.  Problems with body temperature regulation.  Increased or decreased sweating.  Focal Neuropathy  Abnormal eye movements or abnormal alignment of both  eyes.  Weakness in the wrist.  Foot drop. This results in an inability to lift the foot properly and abnormal walking or foot movement.  Paralysis on one side of your face (Bell palsy).  Chest or abdominal pain. Radiculoplexus Neuropathy  Sudden, severe pain in your hip, thigh, or buttocks.  Weakness and wasting of thigh muscles.  Difficulty rising from a seated position.  Abdominal  swelling.  Unexplained weight loss (usually more than 10 lb [4.5 kg]). How is this diagnosed? Peripheral Neuropathy Your senses may be tested. Sensory function testing can be done with:  A light touch using a monofilament.  A vibration with tuning fork.  A sharp sensation with a pin prick.  Other tests that can help diagnose neuropathy are:  Nerve conduction velocity. This test checks the transmission of an electrical current through a nerve.  Electromyography. This shows how muscles respond to electrical signals transmitted by nearby nerves.  Quantitative sensory testing. This is used to assess how your nerves respond to vibrations and changes in temperature.  Autonomic Neuropathy Diagnosis is often based on reported symptoms. Tell your health care provider if you experience:  Dizziness.  Constipation.  Diarrhea.  Inappropriate urination or inability to urinate.  Inability to get or maintain an erection.  Tests that may be done include:  Electrocardiography or Holter monitor. These are tests that can help show problems with the heart rate or heart rhythm.  An X-ray exam may be done.  Focal Neuropathy Diagnosis is made based on your symptoms and what your health care provider finds during your exam. Other tests may be done. They may include:  Nerve conduction velocities. This checks the transmission of electrical current through a nerve.  Electromyography. This shows how muscles respond to electrical signals transmitted by nearby nerves.  Quantitative sensory testing. This test is used to assess how your nerves respond to vibration and changes in temperature.  Radiculoplexus Neuropathy  Often the first thing is to eliminate any other issue or problems that might be the cause, as there is no standard test for diagnosis.  X-ray exam of your spine and lumbar region.  Spinal tap to rule out cancer.  MRI to rule out other lesions. How is this treated? Once nerve  damage occurs, it cannot be reversed. The goal of treatment is to keep the disease or nerve damage from getting worse and affecting more nerve fibers. Controlling your blood glucose level is the key. Most people with radiculoplexus neuropathy see at least a partial improvement over time. You will need to keep your blood glucose and HbA1c levels in the target range determined by your health care provider. Things that help control blood glucose levels include:  Blood glucose monitoring.  Meal planning.  Physical activity.  Diabetes medicine.  Over time, maintaining lower blood glucose levels helps lessen symptoms. Sometimes, prescription pain medicine is needed. Follow these instructions at home:  Do not smoke.  Keep your blood glucose level in the range that you and your health care provider have determined acceptable for you.  Keep your blood pressure level in the range that you and your health care provider have determined acceptable for you.  Eat a well-balanced diet.  Be physically active every day. Include strength training and balance exercises.  Protect your feet. ? Check your feet every day for sores, cuts, blisters, or signs of infection. ? Wear padded socks and supportive shoes. Use orthotic inserts, if necessary. ? Regularly check the insides of your shoes for worn  spots. Make sure there are no rocks or other items inside your shoes before you put them on. Contact a health care provider if:  You have burning, stabbing, or aching pain in the legs or feet.  You are unable to feel pressure or pain in your feet.  You develop problems with digestion such as: ? Nausea. ? Vomiting. ? Bloating. ? Constipation. ? Diarrhea. ? Abdominal pain.  You have difficulty with urination, such as: ? Incontinence. ? Retention.  You have palpitations.  You develop orthostatic hypotension. When you stand up you may feel: ? Dizzy. ? Weak. ? Faint.  You cannot attain and maintain  an erection (in men).  You have vaginal dryness and problems with decreased sexual desire and arousal (in women).  You have severe pain in your thighs, legs, or buttocks.  You have unexplained weight loss. This information is not intended to replace advice given to you by your health care provider. Make sure you discuss any questions you have with your health care provider. Document Released: 04/28/2001 Document Revised: 07/26/2015 Document Reviewed: 07/29/2012 Elsevier Interactive Patient Education  2017 [employer].

## 2017-06-10 NOTE — Progress Notes (Signed)
Tyrone Cunningham 62 y.o.   Chief Complaint  Patient presents with  . Hand Pain    HISTORY OF PRESENT ILLNESS: This is a 62 y.o. male chronic pain patient on multiple controlled medications unable to somehow contact his pain management center.  States he is in need of pain medication.  Vague history.  HPI   Prior to Admission medications   Medication Sig Start Date End Date Taking? Authorizing Provider  albuterol (PROVENTIL) (2.5 MG/3ML) 0.083% nebulizer solution Take 3 mLs (2.5 mg total) by nebulization 4 (four) times daily. 05/26/17  Yes Weber, [name], PA-C  atorvastatin (LIPITOR) 20 MG tablet Take 1 tablet (20 mg total) by mouth daily. Patient not taking: Reported on 06/10/2017 05/26/17   Morrell Riddle, PA-C  docusate sodium (COLACE) 100 MG capsule Take 1 capsule (100 mg total) by mouth 2 (two) times daily. Patient not taking: Reported on 06/10/2017 05/26/17   Morrell Riddle, PA-C  gabapentin (NEURONTIN) 400 MG capsule Take 1 capsule (400 mg total) by mouth 3 (three) times daily. Patient not taking: Reported on 06/10/2017 05/29/17   Morrell Riddle, PA-C  metFORMIN (GLUCOPHAGE XR) 500 MG 24 hr tablet Take 2 tablets (1,000 mg total) by mouth 2 (two) times daily. Patient not taking: Reported on 06/10/2017 05/26/17   Valarie Cones, Dema Severin, PA-C  Morphine-Naltrexone (EMBEDA) 30-1.2 MG CPCR Take 1 capsule by mouth 2 (two) times daily.     [provider]  sitaGLIPtin (JANUVIA) 100 MG tablet Take 1 tablet (100 mg total) by mouth daily. Patient not taking: Reported on 06/10/2017 05/26/17   Morrell Riddle, PA-C  traMADol (ULTRAM) 50 MG tablet Take 50 mg by mouth every 6 (six) hours as needed for moderate pain.    [provider]  umeclidinium bromide (INCRUSE ELLIPTA) 62.5 MCG/INH AEPB Inhale 1 puff into the lungs daily. Patient not taking: Reported on 06/10/2017 05/26/17   Morrell Riddle, PA-C    No Known Allergies  Patient Active Problem List   Diagnosis Date Noted  . SOB (shortness of  breath) 09/13/2016  . Peripheral vascular disease (HCC) 08/19/2016  . Tracheostomy care (HCC) 06/28/2015  . Obstructive sleep apnea 06/28/2015  . Chronic pain syndrome 04/18/2015  . Neuropathic pain 04/18/2015  . COPD exacerbation (HCC) 03/21/2015  . Constipation 01/07/2014  . Abdominal pain   . Constipation, chronic 01/04/2014  . Chronic respiratory failure (HCC) 01/06/2013  . Edema 03/16/2012  . Obesity 10/20/2011  . 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 OSA (obstructive sleep apnea) 10/16/2011  . [name] with frequent exacerbations   . CHF (congestive heart failure) (HCC)   . Hypertension   . Hypercholesteremia   . Hearing loss   . Diabetic neuropathy [facility])     Past Medical History:  Diagnosis Date  . Bronchitis   . CHF (congestive heart failure) (HCC)   . COPD (chronic obstructive pulmonary disease) (HCC)   . DM type 2 with diabetic peripheral neuropathy (HCC)   . Hearing loss   . Hypercholesteremia   . Hypertension   . Peripheral neuropathy   . Sleep apnea    Severe sleep apnea requiring tracheostomy    Past Surgical History:  Procedure Laterality Date  . APPENDECTOMY    . CHOLECYSTECTOMY N/A 01/05/2014   Procedure: LAPAROSCOPIC CHOLECYSTECTOMY;  Surgeon: Emelia Loron, MD;  Location: [facility] ORS;  Service: General;  Laterality: N/A;  . FEMUR HARDWARE REMOVAL  09/01/2003  Removal of retained hardware, two interlocking distal femoral  . FEMUR SURGERY    . HAND SURGERY    . MULTIPLE EXTRACTIONS WITH ALVEOLOPLASTY N/A 05/11/2012   Procedure: MULTIPLE EXTRACTION WITH ALVEOLOPLASTY;  Surgeon: [name], DDS;  Location: [facility] OR;  Service: Oral Surgery;  Laterality: N/A;  . TRACHEOSTOMY      Social History   Socioeconomic History  . Marital status: Single    Spouse name: Not on file  . Number of children: 0  . Years of education: Not on file  . Highest education level:  GED or equivalent  Occupational History  . Not on file  Social Needs  . Financial resource strain: Not hard at all  . Food insecurity:    Worry: Sometimes true    Inability: Sometimes true  . Transportation needs:    Medical: No    Non-medical: No  Tobacco Use  . Smoking status: Former Smoker    Packs/day: 2.00    Years: 30.00    Pack years: 60.00    Types: Cigarettes    Last attempt to quit: 07/02/1999    Years since quitting: 17.9  . Smokeless tobacco: Never Used  Substance and Sexual Activity  . Alcohol use: No  . Drug use: No  . Sexual activity: Never  Lifestyle  . Physical activity:    Days per week: 0 days    Minutes per session: 0 min  . Stress: Only a little  Relationships  . Social connections:    Talks on phone: Once a week    Gets together: Once a week    Attends religious service: Never    Active member of club or organization: No    Attends meetings of clubs or organizations: Never    Relationship status: Not on file  . Intimate partner violence:    Fear of current or ex partner: No    Emotionally abused: No    Physically abused: No    Forced sexual activity: No  Other Topics Concern  . Not on file  Social History Narrative   Lives with roommate in a hotel.   In wheelchair - minimal walking    Family History  Problem Relation Age of Onset  . Coronary artery disease Father      Review of Systems  Constitutional: Negative.  Negative for chills and fever.  Respiratory: Negative for shortness of breath.   Cardiovascular: Negative for chest pain.  Gastrointestinal: Negative for abdominal pain, nausea and vomiting.  Skin: Negative.  Negative for rash.  Neurological: Negative for dizziness and headaches.     Vitals:   06/10/17 1416  BP: (!) 151/88  Pulse: 94  Resp: 16  Temp: 98.6 F (37 C)  SpO2: 98%     Physical Exam  Constitutional: He is oriented to person, place, and time. He appears well-developed and well-nourished.  HENT:  Head:  Normocephalic and atraumatic.  Eyes: Pupils are equal, round, and reactive to light.  Cardiovascular: Normal rate.  Pulmonary/Chest: Effort normal.  Neurological: He is alert and oriented to person, place, and time.  Skin: Skin is warm and dry.  Psychiatric: He has a normal mood and affect. His behavior is normal.  Vitals reviewed.    ASSESSMENT & PLAN: Tyrone Cunningham was seen today for hand pain.  Diagnoses and all orders for this visit:  Chronic pain syndrome -     diclofenac (VOLTAREN) 75 MG EC tablet; Take 1 tablet (75 mg total) by mouth 2 (two) times daily  for 5 days.  DM (diabetes mellitus), type 2 with neurological complications [facility])  Neuropathic pain    Patient Instructions    Must follow-up with your pain management doctor for further treatment please.   IF you received an x-ray today, you will receive an invoice from [facility]. Please contact [facility] at [phone] with questions or concerns regarding your invoice.   IF you received labwork today, you will receive an invoice from [place]. Please contact [facility] at [phone] with questions or concerns regarding your invoice.   Our billing staff will not be able to assist you with questions regarding bills from these companies.  You will be contacted with the lab results as soon as they are available. The fastest way to get your results is to activate your My Chart account. Instructions are located on the last page of this paperwork. If you have not heard from [place] regarding the results in 2 weeks, please contact this office.     Chronic Pain, Adult Chronic pain is a type of pain that lasts or keeps coming back (recurs) for at least six months. You may have chronic headaches, abdominal pain, or body pain. Chronic pain may be related to an illness, such as fibromyalgia or complex regional pain syndrome. Sometimes the cause of chronic pain is not known. Chronic pain can make it hard for you to do  daily activities. If not treated, chronic pain can lead to other health problems, including anxiety and depression. Treatment depends on the cause and severity of your pain. You may need to work with a pain specialist to come up with a treatment plan. The plan may include medicine, counseling, and physical therapy. Many people benefit from a combination of two or more types of treatment to control their pain. Follow these instructions at home: Lifestyle  Consider keeping a pain diary to share with your health care providers.  Consider talking with a mental health care provider (psychologist) about how to cope with chronic pain.  Consider joining a chronic pain support group.  Try to control or lower your stress levels. Talk to your health care provider about strategies to do this. General instructions   Take over-the-counter and prescription medicines only as told by your health care provider.  Follow your treatment plan as told by your health care provider. This may include: ? Gentle, regular exercise. ? Eating a healthy diet that includes foods such as vegetables, fruits, fish, and lean meats. ? Cognitive or behavioral therapy. ? Working with a Adult nurse. ? Meditation or yoga. ? Acupuncture or massage therapy. ? Aroma, color, light, or sound therapy. ? Local electrical stimulation. ? Shots (injections) of numbing or pain-relieving medicines into the spine or the area of pain.  Check your pain level as told by your health care provider. Ask your health care provider if you should use a pain scale.  Learn as much as you can about how to manage your chronic pain. Ask your health care provider if an intensive pain rehabilitation program or a chronic pain specialist would be helpful.  Keep all follow-up visits as told by your health care provider. This is important. Contact a health care provider if:  Your pain gets worse.  You have new pain.  You have trouble  sleeping.  You have trouble doing your normal activities.  Your pain is not controlled with treatment.  Your have side effects from pain medicine.  You feel weak. Get help right away if:  You lose feeling  or have numbness in your body.  You lose control of bowel or bladder function.  Your pain suddenly gets much worse.  You develop shaking or chills.  You develop confusion.  You develop chest pain.  You have trouble breathing or shortness of breath.  You pass out.  You have thoughts about hurting yourself or others. This information is not intended to replace advice given to you by your health care provider. Make sure you discuss any questions you have with your health care provider. Document Released: 11/09/2001 Document Revised: 10/18/2015 Document Reviewed: 08/07/2015 Elsevier Interactive Patient Education  2018 [employer].  Diabetic Neuropathy Diabetic neuropathy is a nerve disease or nerve damage that is caused by diabetes mellitus. About half of all people with diabetes mellitus have some form of nerve damage. Nerve damage is more common in those who have had diabetes mellitus for many years and who generally have not had good control of their blood sugar (glucose) level. Diabetic neuropathy is a common complication of diabetes mellitus. There are three common types of diabetic neuropathy and a fourth type that is less common and less understood:  Peripheral neuropathy-This is the most common type of diabetic neuropathy. It causes damage to the nerves of the feet and legs first and then eventually the hands and arms. The damage affects the ability to sense touch.  Autonomic neuropathy-This type causes damage to the autonomic nervous system, which controls the following functions: ? Heartbeat. ? Body temperature. ? Blood pressure. ? Urination. ? Digestion. ? Sweating. ? Sexual function.  Focal neuropathy-Focal neuropathy can be painful and unpredictable and  occurs most often in older adults with diabetes mellitus. It involves a specific nerve or one area and often comes on suddenly. It usually does not cause long-term problems.  Radiculoplexus neuropathy- Sometimes called lumbosacral radiculoplexus neuropathy, radiculoplexus neuropathy affects the nerves of the thighs, hips, buttocks, or legs. It is more common in people with type 2 diabetes mellitus and in older men. It is characterized by debilitating pain, weakness, and atrophy, usually in the thigh muscles.  What are the causes? The cause of peripheral, autonomic, and focal neuropathies is diabetes mellitus that is uncontrolled and high glucose levels. The cause of radiculoplexus neuropathy is unknown. However, it is thought to be caused by inflammation related to uncontrolled glucose levels. What are the signs or symptoms? Peripheral Neuropathy Peripheral neuropathy develops slowly over time. When the nerves of the feet and legs no longer work there may be:  Burning, stabbing, or aching pain in the legs or feet.  Inability to feel pressure or pain in your feet. This can lead to: ? Thick calluses over pressure areas. ? Pressure sores. ? Ulcers.  Foot deformities.  Reduced ability to feel temperature changes.  Muscle weakness.  Autonomic Neuropathy The symptoms of autonomic neuropathy vary depending on which nerves are affected. Symptoms may include:  Problems with digestion, such as: ? Feeling sick to your stomach (nausea). ? Vomiting. ? Bloating. ? Constipation. ? Diarrhea. ? Abdominal pain.  Difficulty with urination. This occurs if you lose your ability to sense when your bladder is full. Problems include: ? Urine leakage (incontinence). ? Inability to empty your bladder completely (retention).  Rapid or irregular heartbeat (palpitations).  Blood pressure drops when you stand up (orthostatic hypotension). When you stand up you may feel: ? Dizzy. ? Weak. ? Faint.  In  men, inability to attain and maintain an erection.  In women, vaginal dryness and problems with decreased sexual desire  and arousal.  Problems with body temperature regulation.  Increased or decreased sweating.  Focal Neuropathy  Abnormal eye movements or abnormal alignment of both eyes.  Weakness in the wrist.  Foot drop. This results in an inability to lift the foot properly and abnormal walking or foot movement.  Paralysis on one side of your face (Bell palsy).  Chest or abdominal pain. Radiculoplexus Neuropathy  Sudden, severe pain in your hip, thigh, or buttocks.  Weakness and wasting of thigh muscles.  Difficulty rising from a seated position.  Abdominal swelling.  Unexplained weight loss (usually more than 10 lb [4.5 kg]). How is this diagnosed? Peripheral Neuropathy Your senses may be tested. Sensory function testing can be done with:  A light touch using a monofilament.  A vibration with tuning fork.  A sharp sensation with a pin prick.  Other tests that can help diagnose neuropathy are:  Nerve conduction velocity. This test checks the transmission of an electrical current through a nerve.  Electromyography. This shows how muscles respond to electrical signals transmitted by nearby nerves.  Quantitative sensory testing. This is used to assess how your nerves respond to vibrations and changes in temperature.  Autonomic Neuropathy Diagnosis is often based on reported symptoms. Tell your health care provider if you experience:  Dizziness.  Constipation.  Diarrhea.  Inappropriate urination or inability to urinate.  Inability to get or maintain an erection.  Tests that may be done include:  Electrocardiography or Holter monitor. These are tests that can help show problems with the heart rate or heart rhythm.  An X-ray exam may be done.  Focal Neuropathy Diagnosis is made based on your symptoms and what your health care provider finds during your  exam. Other tests may be done. They may include:  Nerve conduction velocities. This checks the transmission of electrical current through a nerve.  Electromyography. This shows how muscles respond to electrical signals transmitted by nearby nerves.  Quantitative sensory testing. This test is used to assess how your nerves respond to vibration and changes in temperature.  Radiculoplexus Neuropathy  Often the first thing is to eliminate any other issue or problems that might be the cause, as there is no standard test for diagnosis.  X-ray exam of your spine and lumbar region.  Spinal tap to rule out cancer.  MRI to rule out other lesions. How is this treated? Once nerve damage occurs, it cannot be reversed. The goal of treatment is to keep the disease or nerve damage from getting worse and affecting more nerve fibers. Controlling your blood glucose level is the key. Most people with radiculoplexus neuropathy see at least a partial improvement over time. You will need to keep your blood glucose and HbA1c levels in the target range determined by your health care provider. Things that help control blood glucose levels include:  Blood glucose monitoring.  Meal planning.  Physical activity.  Diabetes medicine.  Over time, maintaining lower blood glucose levels helps lessen symptoms. Sometimes, prescription pain medicine is needed. Follow these instructions at home:  Do not smoke.  Keep your blood glucose level in the range that you and your health care provider have determined acceptable for you.  Keep your blood pressure level in the range that you and your health care provider have determined acceptable for you.  Eat a well-balanced diet.  Be physically active every day. Include strength training and balance exercises.  Protect your feet. ? Check your feet every day for sores, cuts, blisters, or signs  of infection. ? Wear padded socks and supportive shoes. Use orthotic inserts,  if necessary. ? Regularly check the insides of your shoes for worn spots. Make sure there are no rocks or other items inside your shoes before you put them on. Contact a health care provider if:  You have burning, stabbing, or aching pain in the legs or feet.  You are unable to feel pressure or pain in your feet.  You develop problems with digestion such as: ? Nausea. ? Vomiting. ? Bloating. ? Constipation. ? Diarrhea. ? Abdominal pain.  You have difficulty with urination, such as: ? Incontinence. ? Retention.  You have palpitations.  You develop orthostatic hypotension. When you stand up you may feel: ? Dizzy. ? Weak. ? Faint.  You cannot attain and maintain an erection (in men).  You have vaginal dryness and problems with decreased sexual desire and arousal (in women).  You have severe pain in your thighs, legs, or buttocks.  You have unexplained weight loss. This information is not intended to replace advice given to you by your health care provider. Make sure you discuss any questions you have with your health care provider. Document Released: 04/28/2001 Document Revised: 07/26/2015 Document Reviewed: 07/29/2012 Elsevier Interactive Patient Education  2017 [employer]vier Inc.      Edwina BarthMiguel Lucion Dilger, MD [facility]

## 2017-06-11 NOTE — Telephone Encounter (Signed)
Voltaren 75 mg EC tablet refill request  Just seen by Dr. Alvy BimlerSagardia 06/10/17 given a 30 day supply with no refills.   Already requesting a refill.  [facility] [phone] [address] - [id number]

## 2017-06-24 ENCOUNTER — Other Ambulatory Visit: Payer: Self-pay | Admitting: Physician Assistant

## 2017-06-24 DIAGNOSIS — J449 Chronic obstructive pulmonary disease, unspecified: Secondary | ICD-10-CM

## 2017-07-13 ENCOUNTER — Ambulatory Visit: Payer: Medicare Other | Admitting: Physician Assistant

## 2017-07-23 ENCOUNTER — Ambulatory Visit (INDEPENDENT_AMBULATORY_CARE_PROVIDER_SITE_OTHER): Payer: Medicare Other | Admitting: Physician Assistant

## 2017-07-23 ENCOUNTER — Other Ambulatory Visit: Payer: Self-pay

## 2017-07-23 ENCOUNTER — Encounter: Payer: Self-pay | Admitting: Physician Assistant

## 2017-07-23 VITALS — BP 152/72 | HR 113 | Temp 98.7°F | Resp 22 | Ht 66.34 in | Wt 258.4 lb

## 2017-07-23 DIAGNOSIS — M792 Neuralgia and neuritis, unspecified: Secondary | ICD-10-CM

## 2017-07-23 DIAGNOSIS — Z91120 Patient's intentional underdosing of medication regimen due to financial hardship: Secondary | ICD-10-CM

## 2017-07-23 DIAGNOSIS — Z598 Other problems related to housing and economic circumstances: Secondary | ICD-10-CM

## 2017-07-23 DIAGNOSIS — Z5989 Other problems related to housing and economic circumstances: Secondary | ICD-10-CM

## 2017-07-23 DIAGNOSIS — J449 Chronic obstructive pulmonary disease, unspecified: Secondary | ICD-10-CM | POA: Diagnosis not present

## 2017-07-23 DIAGNOSIS — Z930 Tracheostomy status: Secondary | ICD-10-CM

## 2017-07-23 DIAGNOSIS — G894 Chronic pain syndrome: Secondary | ICD-10-CM | POA: Diagnosis not present

## 2017-07-23 DIAGNOSIS — E1149 Type 2 diabetes mellitus with other diabetic neurological complication: Secondary | ICD-10-CM | POA: Diagnosis not present

## 2017-07-23 DIAGNOSIS — Z993 Dependence on wheelchair: Secondary | ICD-10-CM | POA: Diagnosis not present

## 2017-07-23 DIAGNOSIS — Z599 Problem related to housing and economic circumstances, unspecified: Secondary | ICD-10-CM

## 2017-07-23 NOTE — Patient Instructions (Signed)
     IF you received an x-ray today, you will receive an invoice from [facility]. Please contact [facility] at [phone] with questions or concerns regarding your invoice.   IF you received labwork today, you will receive an invoice from [facility]. Please contact [facility] at [phone] with questions or concerns regarding your invoice.   Our billing staff will not be able to assist you with questions regarding bills from these companies.  You will be contacted with the lab results as soon as they are available. The fastest way to get your results is to activate your My Chart account. Instructions are located on the last page of this paperwork. If you have not heard from us regarding the results in 2 weeks, please contact this office.     

## 2017-07-23 NOTE — Progress Notes (Signed)
Tyrone Cunningham  MRN: [id number] DOB: 1955/06/26  PCP: Morrell Riddle, PA-C  Chief Complaint  Patient presents with  . Hypertension  . Power Chair    pt would like to get a power chair    Subjective:  Pt presents to clinic for Rx for a power wheelchair.  His had some wheel problems and he was working with [facility] to get it fixed but they have not been able to get it fixed.  When he does not have his chair he is really suffering for transportation.  His brother rented him a manual wheelchair which he is really not able to get the place that he can with his motorized wheelchair.  He would like another one.  Things are really difficult for him currently.    Pt is having trouble affording his medication.  He is currently using his COPD meds and Ultram. No other medications due to financial issues.  He has been to preferred pain management for years but they said they could not longer because he was using cocaine.  He states he does not use cocaine "if I could afford that I would not go to pain clinic".  He has pain medications left over that he has been using.  He would like referral to another pain management.  Not good living arrangement - living with a family friend in a motel room but his roommate is moving and he can not afford the room by himself.   Brother is helping him with food currently but that is it.    He never heard from social work.  He is tearful several times in the visit, he states he has minimal thoughts of hurting himself and he would never really try suicide.  History is obtained by patient.  Review of Systems  Psychiatric/Behavioral: Positive for dysphoric mood.    Patient Active Problem List   Diagnosis Date Noted  . SOB (shortness of breath) 09/13/2016  . Peripheral vascular disease (HCC) 08/19/2016  . Tracheostomy care (HCC) 06/28/2015  . Obstructive sleep apnea 06/28/2015  . Chronic pain syndrome 04/18/2015  . Neuropathic pain 04/18/2015  . COPD  exacerbation (HCC) 03/21/2015  . Constipation 01/07/2014  . Abdominal pain   . Constipation, chronic 01/04/2014  . Chronic respiratory failure (HCC) 01/06/2013  . Edema 03/16/2012  . Obesity 10/20/2011  . 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 OSA (obstructive sleep apnea) 10/16/2011  . [name] with frequent exacerbations   . CHF (congestive heart failure) (HCC)   . Hypertension   . Hypercholesteremia   . Hearing loss   . Diabetic neuropathy (HCC)     Current Outpatient Medications on File Prior to Visit  Medication Sig Dispense Refill  . albuterol (PROVENTIL) (2.5 MG/3ML) 0.083% nebulizer solution Take 3 mLs (2.5 mg total) by nebulization 4 (four) times daily. 1080 mL 3  . atorvastatin (LIPITOR) 20 MG tablet Take 1 tablet (20 mg total) by mouth daily. (Patient not taking: Reported on 06/10/2017) 90 tablet 3  . docusate sodium (COLACE) 100 MG capsule Take 1 capsule (100 mg total) by mouth 2 (two) times daily. (Patient not taking: Reported on 06/10/2017) 180 capsule 1  . gabapentin (NEURONTIN) 400 MG capsule Take 1 capsule (400 mg total) by mouth 3 (three) times daily. (Patient not taking: Reported on 06/10/2017) 90 capsule 0  . metFORMIN (GLUCOPHAGE XR) 500 MG 24 hr tablet Take 2 tablets (1,000 mg  total) by mouth 2 (two) times daily. (Patient not taking: Reported on 06/10/2017) 360 tablet 1  . Morphine-Naltrexone (EMBEDA) 30-1.2 MG CPCR Take 1 capsule by mouth 2 (two) times daily.     . sitaGLIPtin (JANUVIA) 100 MG tablet Take 1 tablet (100 mg total) by mouth daily. (Patient not taking: Reported on 06/10/2017) 90 tablet 0  . traMADol (ULTRAM) 50 MG tablet Take 50 mg by mouth every 6 (six) hours as needed for moderate pain.    Marland Kitchen umeclidinium bromide (INCRUSE ELLIPTA) 62.5 MCG/INH AEPB Inhale 1 puff into the lungs daily. (Patient not taking: Reported on 06/10/2017) 90 each 1   No current  facility-administered medications on file prior to visit.     No Known Allergies  Past Medical History:  Diagnosis Date  . Bronchitis   . CHF (congestive heart failure) (HCC)   . COPD (chronic obstructive pulmonary disease) (HCC)   . DM type 2 with diabetic peripheral neuropathy (HCC)   . Hearing loss   . Hypercholesteremia   . Hypertension   . Peripheral neuropathy   . Sleep apnea    Severe sleep apnea requiring tracheostomy   Social History   Social History Narrative   Lives with roommate in a hotel.   In wheelchair - minimal walking   Social History   Tobacco Use  . Smoking status: Former Smoker    Packs/day: 2.00    Years: 30.00    Pack years: 60.00    Types: Cigarettes    Last attempt to quit: 07/02/1999    Years since quitting: 18.0  . Smokeless tobacco: Never Used  Substance Use Topics  . Alcohol use: No  . Drug use: No   family history includes Coronary artery disease in his father.     Objective:  BP (!) 152/72 (BP Location: Right Arm, Patient Position: Sitting, Cuff Size: Large)   Pulse (!) 113   Temp 98.7 F (37.1 C) (Oral)   Resp (!) 22   Ht 5' 6.34" (1.685 m)   Wt 258 lb 6.4 oz (117.2 kg)   SpO2 95%   BMI 41.28 kg/m  Body mass index is 41.28 kg/m.  Physical Exam  Constitutional: He is oriented to person, place, and time.  HENT:  Head: Normocephalic and atraumatic.  Right Ear: External ear normal.  Left Ear: External ear normal.  Eyes: Conjunctivae are normal.  Neck: Normal range of motion.  Pulmonary/Chest: Effort normal.  Neurological: He is alert and oriented to person, place, and time.  Skin: Skin is warm and dry.  Psychiatric: His behavior is normal. Judgment and thought content normal.  Tearful at times    Assessment and Plan :  Chronic pain syndrome - Plan: DME Wheelchair electric, Ambulatory referral to [facility], Ambulatory referral to Pain Clinic  [name] with frequent exacerbations - Plan: DME Wheelchair electric,  Ambulatory referral to [facility]  Independent in wheelchair - Plan: DME Wheelchair electric, Ambulatory referral to [facility]  Tracheostomy dependent [facility]) - Plan: DME Wheelchair electric, Ambulatory referral to [facility]  Patient's intentional underdosing of medication regimen due to financial hardship - Plan: Ambulatory referral to [facility]  Financial difficulties - Plan: Ambulatory referral to [facility]  Living accommodation issues - Plan: Ambulatory referral to [facility]  DM (diabetes mellitus), type 2 with neurological complications (HCC)  Neuropathic pain - Plan: Ambulatory referral to Pain Clinic - referral to another pain clinic - pt will be without medications for a period of time  Pt is really having a hard time financially, living situations and medically.  I did a referral to social work at his last visit but he never heard anything.  We will try another referral but use [facility] this time.  He will contact me if he does not hear in the next couple of weeks.  Benny Lennert PA-C  Primary [facility] at [facility] 07/23/2017 4:48 PM

## 2017-07-28 ENCOUNTER — Encounter: Payer: Self-pay | Admitting: Family Medicine

## 2017-07-29 ENCOUNTER — Telehealth: Payer: Self-pay

## 2017-07-29 NOTE — Telephone Encounter (Signed)
Copied from CRM [id number]. Topic: General - Other >> Jul 24, 2017 10:52 AM Crist Infante wrote: Reason for CRM: jennifer with [employer] called to advise the pt called them this am and said he had a script for a power wheelchair. She is calling to follow up on that, as they need more information from what he provided. Victorino Dike is going to fax over requirement checklist and please call with any questions.  If pt needs another appt, will the office set that up for him?

## 2017-07-29 NOTE — Telephone Encounter (Signed)
[employer] [phone] ext [phone] faxed form needed for pt to get power wheel chair.  (2nd request)  Put in Sarah's box @ [phone] for completion and return fax to company.  Faxed to Maralyn Sago for [employer]

## 2017-07-30 ENCOUNTER — Telehealth: Payer: Self-pay | Admitting: Physician Assistant

## 2017-07-30 NOTE — Telephone Encounter (Signed)
I have the form for a new wheelchair but the patient needs to be seen so I can fill out the very complicated paperwork.

## 2017-07-30 NOTE — Telephone Encounter (Signed)
I can not find this form - can someone bring it to me  Tyrone Cunningham

## 2017-07-31 ENCOUNTER — Telehealth: Payer: Self-pay | Admitting: *Deleted

## 2017-07-31 NOTE — Telephone Encounter (Signed)
Unable to leave message mailbox full. See previous note. Patient will need to schedule an appointment

## 2017-08-03 NOTE — Telephone Encounter (Signed)
medication

## 2017-08-04 ENCOUNTER — Emergency Department (HOSPITAL_COMMUNITY): Payer: Medicare Other

## 2017-08-04 ENCOUNTER — Inpatient Hospital Stay (HOSPITAL_COMMUNITY)
Admission: EM | Admit: 2017-08-04 | Discharge: 2017-08-14 | DRG: 602 | Disposition: A | Discharge status: Home Health | Payer: Medicare Other | Attending: Internal Medicine | Admitting: Internal Medicine

## 2017-08-04 ENCOUNTER — Inpatient Hospital Stay (HOSPITAL_COMMUNITY): Payer: Medicare Other

## 2017-08-04 ENCOUNTER — Ambulatory Visit: Payer: Medicare Other | Admitting: Physician Assistant

## 2017-08-04 ENCOUNTER — Emergency Department (HOSPITAL_COMMUNITY)
Admit: 2017-08-04 | Discharge: 2017-08-04 | Disposition: A | Discharge status: Home / Self Care | Payer: Medicare Other | Attending: Emergency Medicine | Admitting: Emergency Medicine

## 2017-08-04 ENCOUNTER — Other Ambulatory Visit: Payer: Self-pay

## 2017-08-04 ENCOUNTER — Encounter (HOSPITAL_COMMUNITY): Payer: Self-pay | Admitting: Emergency Medicine

## 2017-08-04 DIAGNOSIS — J9610 Chronic respiratory failure, unspecified whether with hypoxia or hypercapnia: Secondary | ICD-10-CM | POA: Diagnosis present

## 2017-08-04 DIAGNOSIS — R609 Edema, unspecified: Secondary | ICD-10-CM

## 2017-08-04 DIAGNOSIS — I5033 Acute on chronic diastolic (congestive) heart failure: Secondary | ICD-10-CM | POA: Diagnosis not present

## 2017-08-04 DIAGNOSIS — Z87891 Personal history of nicotine dependence: Secondary | ICD-10-CM

## 2017-08-04 DIAGNOSIS — E7800 Pure hypercholesterolemia, unspecified: Secondary | ICD-10-CM

## 2017-08-04 DIAGNOSIS — L97529 Non-pressure chronic ulcer of other part of left foot with unspecified severity: Secondary | ICD-10-CM | POA: Diagnosis present

## 2017-08-04 DIAGNOSIS — I878 Other specified disorders of veins: Secondary | ICD-10-CM | POA: Diagnosis present

## 2017-08-04 DIAGNOSIS — E1142 Type 2 diabetes mellitus with diabetic polyneuropathy: Secondary | ICD-10-CM | POA: Diagnosis present

## 2017-08-04 DIAGNOSIS — L97509 Non-pressure chronic ulcer of other part of unspecified foot with unspecified severity: Secondary | ICD-10-CM

## 2017-08-04 DIAGNOSIS — Z8249 Family history of ischemic heart disease and other diseases of the circulatory system: Secondary | ICD-10-CM | POA: Diagnosis not present

## 2017-08-04 DIAGNOSIS — E11621 Type 2 diabetes mellitus with foot ulcer: Secondary | ICD-10-CM

## 2017-08-04 DIAGNOSIS — J449 Chronic obstructive pulmonary disease, unspecified: Secondary | ICD-10-CM | POA: Diagnosis present

## 2017-08-04 DIAGNOSIS — N179 Acute kidney failure, unspecified: Secondary | ICD-10-CM | POA: Diagnosis present

## 2017-08-04 DIAGNOSIS — I110 Hypertensive heart disease with heart failure: Secondary | ICD-10-CM | POA: Diagnosis present

## 2017-08-04 DIAGNOSIS — Z6839 Body mass index (BMI) 39.0-39.9, adult: Secondary | ICD-10-CM

## 2017-08-04 DIAGNOSIS — N189 Chronic kidney disease, unspecified: Secondary | ICD-10-CM | POA: Diagnosis not present

## 2017-08-04 DIAGNOSIS — E785 Hyperlipidemia, unspecified: Secondary | ICD-10-CM | POA: Diagnosis present

## 2017-08-04 DIAGNOSIS — R627 Adult failure to thrive: Secondary | ICD-10-CM | POA: Diagnosis present

## 2017-08-04 DIAGNOSIS — E1149 Type 2 diabetes mellitus with other diabetic neurological complication: Secondary | ICD-10-CM

## 2017-08-04 DIAGNOSIS — L03116 Cellulitis of left lower limb: Secondary | ICD-10-CM | POA: Diagnosis present

## 2017-08-04 DIAGNOSIS — M79605 Pain in left leg: Secondary | ICD-10-CM

## 2017-08-04 DIAGNOSIS — I10 Essential (primary) hypertension: Secondary | ICD-10-CM | POA: Diagnosis present

## 2017-08-04 DIAGNOSIS — G4733 Obstructive sleep apnea (adult) (pediatric): Secondary | ICD-10-CM | POA: Diagnosis not present

## 2017-08-04 DIAGNOSIS — E1122 Type 2 diabetes mellitus with diabetic chronic kidney disease: Secondary | ICD-10-CM | POA: Diagnosis present

## 2017-08-04 DIAGNOSIS — R0600 Dyspnea, unspecified: Secondary | ICD-10-CM

## 2017-08-04 DIAGNOSIS — Z930 Tracheostomy status: Secondary | ICD-10-CM | POA: Diagnosis not present

## 2017-08-04 DIAGNOSIS — L02416 Cutaneous abscess of left lower limb: Secondary | ICD-10-CM | POA: Diagnosis present

## 2017-08-04 DIAGNOSIS — Z9981 Dependence on supplemental oxygen: Secondary | ICD-10-CM | POA: Diagnosis not present

## 2017-08-04 LAB — COMPREHENSIVE METABOLIC PANEL
ALBUMIN: 1.9 g/dL — AB (ref 3.5–5.0)
ALK PHOS: 291 U/L — AB (ref 38–126)
ALT: 35 U/L (ref 17–63)
ANION GAP: 8 (ref 5–15)
AST: 20 U/L (ref 15–41)
BILIRUBIN TOTAL: 0.6 mg/dL (ref 0.3–1.2)
BUN: 28 mg/dL — AB (ref 6–20)
CHLORIDE: 101 mmol/L (ref 101–111)
CO2: 29 mmol/L (ref 22–32)
Calcium: 8 mg/dL — ABNORMAL LOW (ref 8.9–10.3)
Creatinine, Ser: 2 mg/dL — ABNORMAL HIGH (ref 0.61–1.24)
GFR, EST AFRICAN AMERICAN: 39 mL/min — AB (ref >60)
GFR, EST NON AFRICAN AMERICAN: 34 mL/min — AB (ref >60)
Glucose, Bld: 237 mg/dL — ABNORMAL HIGH (ref 65–99)
POTASSIUM: 3.2 mmol/L — AB (ref 3.5–5.1)
SODIUM: 138 mmol/L (ref 135–145)
Total Protein: 6.4 g/dL — ABNORMAL LOW (ref 6.5–8.1)

## 2017-08-04 LAB — CBC WITH DIFFERENTIAL/PLATELET
Basophils Absolute: 0 10*3/uL (ref 0.0–0.1)
Basophils Relative: 0 %
EOS PCT: 0 %
Eosinophils Absolute: 0 10*3/uL (ref 0.0–0.7)
HEMATOCRIT: 36.1 % — AB (ref 39.0–52.0)
HEMOGLOBIN: 12 g/dL — AB (ref 13.0–17.0)
LYMPHS PCT: 8 %
Lymphs Abs: 1.4 10*3/uL (ref 0.7–4.0)
MCH: 29 pg (ref 26.0–34.0)
MCHC: 33.2 g/dL (ref 30.0–36.0)
MCV: 87.2 fL (ref 78.0–100.0)
MONOS PCT: 6 %
Monocytes Absolute: 1.1 10*3/uL — ABNORMAL HIGH (ref 0.1–1.0)
NEUTROS PCT: 86 %
Neutro Abs: 15.2 10*3/uL — ABNORMAL HIGH (ref 1.7–7.7)
Platelets: 266 10*3/uL (ref 150–400)
RBC: 4.14 MIL/uL — ABNORMAL LOW (ref 4.22–5.81)
RDW: 13.3 % (ref 11.5–15.5)
WBC: 17.7 10*3/uL — ABNORMAL HIGH (ref 4.0–10.5)

## 2017-08-04 LAB — I-STAT CG4 LACTIC ACID, ED: Lactic Acid, Venous: 1.57 mmol/L (ref 0.5–1.9)

## 2017-08-04 LAB — GLUCOSE, CAPILLARY: Glucose-Capillary: 258 mg/dL — ABNORMAL HIGH (ref 65–99)

## 2017-08-04 MED ORDER — ACETAMINOPHEN 650 MG RE SUPP: 650.0000 mg | Freq: Four times a day (QID) | RECTAL | Status: DC | PRN

## 2017-08-04 MED ORDER — ALBUTEROL SULFATE (2.5 MG/3ML) 0.083% IN NEBU
2.5000 mg | INHALATION_SOLUTION | RESPIRATORY_TRACT | Status: DC | PRN
  Administered 2017-08-06 – 2017-08-13 (×5): 2.5 mg via RESPIRATORY_TRACT
  Filled 2017-08-04 (×7): qty 3

## 2017-08-04 MED ORDER — INSULIN ASPART 100 UNIT/ML ~~LOC~~ SOLN
0.0000 [IU] | Freq: Three times a day (TID) | SUBCUTANEOUS | Status: DC
  Administered 2017-08-05 (×2): 3 [IU] via SUBCUTANEOUS
  Administered 2017-08-06: 2 [IU] via SUBCUTANEOUS
  Administered 2017-08-06: 3 [IU] via SUBCUTANEOUS
  Administered 2017-08-06: 8 [IU] via SUBCUTANEOUS
  Administered 2017-08-07: 5 [IU] via SUBCUTANEOUS
  Administered 2017-08-07 (×2): 2 [IU] via SUBCUTANEOUS
  Administered 2017-08-08 (×3): 5 [IU] via SUBCUTANEOUS
  Administered 2017-08-09 (×2): 3 [IU] via SUBCUTANEOUS
  Administered 2017-08-09: 5 [IU] via SUBCUTANEOUS
  Administered 2017-08-10: 3 [IU] via SUBCUTANEOUS
  Administered 2017-08-10 – 2017-08-11 (×3): 5 [IU] via SUBCUTANEOUS
  Administered 2017-08-11 – 2017-08-12 (×3): 3 [IU] via SUBCUTANEOUS
  Administered 2017-08-12: 2 [IU] via SUBCUTANEOUS
  Administered 2017-08-12: 3 [IU] via SUBCUTANEOUS
  Administered 2017-08-13: 8 [IU] via SUBCUTANEOUS
  Administered 2017-08-13 (×2): 3 [IU] via SUBCUTANEOUS
  Administered 2017-08-14 (×2): 5 [IU] via SUBCUTANEOUS

## 2017-08-04 MED ORDER — CEFTRIAXONE SODIUM 2 G IJ SOLR
2.0000 g | INTRAMUSCULAR | Status: DC
  Administered 2017-08-04 – 2017-08-13 (×10): 2 g via INTRAVENOUS
  Filled 2017-08-04 (×11): qty 20

## 2017-08-04 MED ORDER — ATORVASTATIN CALCIUM 20 MG PO TABS: 20.0000 mg | ORAL_TABLET | Freq: Every day | ORAL | Status: DC

## 2017-08-04 MED ORDER — DOCUSATE SODIUM 100 MG PO CAPS
100.0000 mg | ORAL_CAPSULE | Freq: Two times a day (BID) | ORAL | Status: DC
  Administered 2017-08-04 – 2017-08-14 (×20): 100 mg via ORAL
  Filled 2017-08-04 (×20): qty 1

## 2017-08-04 MED ORDER — CLINDAMYCIN PHOSPHATE 600 MG/50ML IV SOLN
600.0000 mg | Freq: Once | INTRAVENOUS | Status: AC
  Administered 2017-08-04: 600 mg via INTRAVENOUS
  Filled 2017-08-04: qty 50

## 2017-08-04 MED ORDER — OXYCODONE HCL 5 MG PO TABS
5.0000 mg | ORAL_TABLET | ORAL | Status: DC | PRN
  Administered 2017-08-04 – 2017-08-05 (×3): 5 mg via ORAL
  Filled 2017-08-04 (×3): qty 1

## 2017-08-04 MED ORDER — LACTATED RINGERS IV SOLN
INTRAVENOUS | Status: DC
  Administered 2017-08-04 – 2017-08-05 (×2): via INTRAVENOUS

## 2017-08-04 MED ORDER — ONDANSETRON HCL 4 MG PO TABS
4.0000 mg | ORAL_TABLET | Freq: Four times a day (QID) | ORAL | Status: DC | PRN
  Filled 2017-08-04: qty 1

## 2017-08-04 MED ORDER — ENOXAPARIN SODIUM 40 MG/0.4ML ~~LOC~~ SOLN
40.0000 mg | SUBCUTANEOUS | Status: DC
  Administered 2017-08-04 – 2017-08-13 (×10): 40 mg via SUBCUTANEOUS
  Filled 2017-08-04 (×10): qty 0.4

## 2017-08-04 MED ORDER — ACETAMINOPHEN 325 MG PO TABS
650.0000 mg | ORAL_TABLET | Freq: Four times a day (QID) | ORAL | Status: DC | PRN
  Filled 2017-08-04: qty 2

## 2017-08-04 MED ORDER — ONDANSETRON HCL 4 MG/2ML IJ SOLN: 4.0000 mg | Freq: Four times a day (QID) | INTRAMUSCULAR | Status: DC | PRN

## 2017-08-04 MED ORDER — INSULIN ASPART 100 UNIT/ML ~~LOC~~ SOLN
0.0000 [IU] | Freq: Every day | SUBCUTANEOUS | Status: DC
  Administered 2017-08-04: 3 [IU] via SUBCUTANEOUS
  Administered 2017-08-07: 2 [IU] via SUBCUTANEOUS
  Administered 2017-08-08: 3 [IU] via SUBCUTANEOUS
  Administered 2017-08-11 – 2017-08-12 (×2): 2 [IU] via SUBCUTANEOUS
  Administered 2017-08-13: 3 [IU] via SUBCUTANEOUS

## 2017-08-04 MED ORDER — DEXTROSE 50 % IV SOLN
INTRAVENOUS | Status: AC
  Filled 2017-08-04: qty 50

## 2017-08-04 NOTE — ED Notes (Signed)
Pt assisted to use urinal at the bedside

## 2017-08-04 NOTE — ED Notes (Signed)
ED Provider at bedside. 

## 2017-08-04 NOTE — Telephone Encounter (Signed)
Pt is currently in the hospital. 

## 2017-08-04 NOTE — ED Provider Notes (Signed)
[facility] EMERGENCY DEPARTMENT Provider Note   CSN: [id number] Arrival date & time: 08/04/17  4818     History   Chief Complaint Chief Complaint  Patient presents with  . Leg Pain    HPI Tyrone Cunningham is a 62 y.o. male with PMH/o CHF, COPD, DM, HTN, who presents for evaluation of left lower extremity pain, erythema, swelling that is been ongoing for last 3 to 4 days.  Patient reports that it worsened today, prompting EMS call.  He denies any preceding trauma, injury, fall.  Patient reports that the leg has been warm, red, swollen.  He reports that it initially started towards the bottom of his leg and is spread upward.  Patient reports he has noticed some drainage from the leg.  Patient does report that he is very sedentary and does not get up and move around a lot.  Patient states he is not on any blood thinners.  Patient denies any history of blood clots.  Patient states he has not measured any temperatures does not know he has been having fever.  Patient denies any chest pain, difficulty breathing, abdominal pain, nausea/vomiting.  The history is provided by the patient.    Past Medical History:  Diagnosis Date  . Bronchitis   . CHF (congestive heart failure) (HCC)    grade 1 diastolic with preserved EF in 2017  . COPD (chronic obstructive pulmonary disease) ([place])   . DM type 2 with diabetic peripheral neuropathy ([place])   . Hearing loss   . Hypercholesteremia   . Hypertension   . Peripheral neuropathy   . Sleep apnea    Severe sleep apnea requiring tracheostomy    Patient Active Problem List   Diagnosis Date Noted  . Cellulitis and abscess of left leg 08/04/2017  . SOB (shortness of breath) 09/13/2016  . Peripheral vascular disease ([place]) 08/19/2016  . Tracheostomy care ([place]) 06/28/2015  . Obstructive sleep apnea 06/28/2015  . Chronic pain syndrome 04/18/2015  . Neuropathic pain 04/18/2015  . COPD exacerbation ([place]) 03/21/2015  . Constipation 01/07/2014   . Abdominal pain   . Constipation, chronic 01/04/2014  . Chronic respiratory failure ([place]) 01/06/2013  . Edema 03/16/2012  . Obesity 10/20/2011  . DM (diabetes mellitus), type 2 with neurological complications ([place]) 56/31/4970  . Tracheostomy dependent ([place]) 10/16/2011  . OSA (obstructive sleep apnea) 10/16/2011  . [name] with frequent exacerbations   . CHF (congestive heart failure) ([place])   . Hypertension   . Hypercholesteremia   . Hearing loss   . Diabetic neuropathy [facility])     Past Surgical History:  Procedure Laterality Date  . APPENDECTOMY    . CHOLECYSTECTOMY N/A 01/05/2014   Procedure: LAPAROSCOPIC CHOLECYSTECTOMY;  Surgeon: Rolm Bookbinder, MD;  Location: [facility] ORS;  Service: General;  Laterality: N/A;  . FEMUR HARDWARE REMOVAL  09/01/2003   Removal of retained hardware, two interlocking distal femoral  . FEMUR SURGERY    . HAND SURGERY    . MULTIPLE EXTRACTIONS WITH ALVEOLOPLASTY N/A 05/11/2012   Procedure: MULTIPLE EXTRACTION WITH ALVEOLOPLASTY;  Surgeon: Gae Bon, DDS;  Location: [facility];  Service: Oral Surgery;  Laterality: N/A;  . TRACHEOSTOMY          Home Medications    Prior to Admission medications   Medication Sig Start Date End Date Taking? Authorizing Provider  albuterol (PROVENTIL) (2.5 MG/3ML) 0.083% nebulizer solution Take 3 mLs (2.5 mg total) by nebulization 4 (four) times daily. 05/26/17  Yes Gale Journey, Judson Roch  L, PA-C  OXYGEN Inhale 10 L into the lungs continuous.   Yes [provider]  traMADol (ULTRAM) 50 MG tablet Take 50 mg by mouth every 6 (six) hours as needed for moderate pain.   Yes [provider]  atorvastatin (LIPITOR) 20 MG tablet Take 1 tablet (20 mg total) by mouth daily. Patient not taking: Reported on 06/10/2017 05/26/17   Mancel Bale, PA-C  gabapentin (NEURONTIN) 400 MG capsule Take 1 capsule (400 mg total) by mouth 3 (three) times daily. Patient not taking: Reported on 06/10/2017 05/29/17   Mancel Bale, PA-C    metFORMIN (GLUCOPHAGE XR) 500 MG 24 hr tablet Take 2 tablets (1,000 mg total) by mouth 2 (two) times daily. Patient not taking: Reported on 06/10/2017 05/26/17   Mancel Bale, PA-C  sitaGLIPtin (JANUVIA) 100 MG tablet Take 1 tablet (100 mg total) by mouth daily. Patient not taking: Reported on 06/10/2017 05/26/17   Mancel Bale, PA-C  umeclidinium bromide (INCRUSE ELLIPTA) 62.5 MCG/INH AEPB Inhale 1 puff into the lungs daily. Patient not taking: Reported on 06/10/2017 05/26/17   Mancel Bale, PA-C    Family History Family History  Problem Relation Age of Onset  . Coronary artery disease Father     Social History Social History   Tobacco Use  . Smoking status: Former Smoker    Packs/day: 2.00    Years: 30.00    Pack years: 60.00    Types: Cigarettes    Last attempt to quit: 07/02/1999    Years since quitting: 18.1  . Smokeless tobacco: Never Used  Substance Use Topics  . Alcohol use: No    Comment: heavy drinker, quit in 1983  . Drug use: No    Comment: used "everything but heroin, I had my days"     Allergies   Patient has no known allergies.   Review of Systems Review of Systems  Constitutional: Negative for fever.  Respiratory: Negative for cough and shortness of breath.   Cardiovascular: Positive for leg swelling. Negative for chest pain.  Gastrointestinal: Negative for abdominal pain, nausea and vomiting.  Genitourinary: Negative for dysuria and hematuria.  Skin: Positive for color change and wound.  Neurological: Negative for headaches.  All other systems reviewed and are negative.    Physical Exam Updated Vital Signs BP (!) 144/63   Pulse 95   Temp 98.6 F (37 C) (Oral)   Resp 20   SpO2 96%   Physical Exam  Constitutional: He is oriented to person, place, and time. He appears well-developed and well-nourished.  HENT:  Head: Normocephalic and atraumatic.  Mouth/Throat: Oropharynx is clear and moist and mucous membranes are normal.  Eyes: Pupils  are equal, round, and reactive to light. Conjunctivae, EOM and lids are normal.  Neck: Full passive range of motion without pain.  Cardiovascular: Normal rate, regular rhythm, normal heart sounds and normal pulses. Exam reveals no gallop and no friction rub.  No murmur heard. Pulses:      Dorsalis pedis pulses are 2+ on the right side, and 2+ on the left side.  Pulmonary/Chest: Effort normal and breath sounds normal.  Lungs clear to auscultation bilaterally.  Symmetric chest rise.  No wheezing, rales, rhonchi.  Abdominal: Soft. Normal appearance. There is no tenderness. There is no rigidity and no guarding.  Musculoskeletal: Normal range of motion.  Left lower extremity with diffuse erythema, warmth, edema noted from just above the knee that extends distally.  There is a 3 cm in  diameter wound noted to the anterior distal tib-fib with some purulent drainage noted.  Neurological: He is alert and oriented to person, place, and time.  Follows commands, Moves all extremities  5/5 strength to BUE and BLE  Sensation intact throughout all major nerve distributions  Skin: Skin is warm and dry. Capillary refill takes less than 2 seconds.  Psychiatric: He has a normal mood and affect. His speech is normal.  Nursing note and vitals reviewed.      ED Treatments / Results  Labs (all labs ordered are listed, but only abnormal results are displayed) Labs Reviewed  COMPREHENSIVE METABOLIC PANEL - Abnormal; Notable for the following components:      Result Value   Potassium 3.2 (*)    Glucose, Bld 237 (*)    BUN 28 (*)    Creatinine, Ser 2.00 (*)    Calcium 8.0 (*)    Total Protein 6.4 (*)    Albumin 1.9 (*)    Alkaline Phosphatase 291 (*)    GFR calc non Af Amer 34 (*)    GFR calc Af Amer 39 (*)    All other components within normal limits  CBC WITH DIFFERENTIAL/PLATELET - Abnormal; Notable for the following components:   WBC 17.7 (*)    RBC 4.14 (*)    Hemoglobin 12.0 (*)    HCT 36.1 (*)     Neutro Abs 15.2 (*)    Monocytes Absolute 1.1 (*)    All other components within normal limits  I-STAT CG4 LACTIC ACID, ED  I-STAT CG4 LACTIC ACID, ED    EKG None  Radiology Dg Tibia/fibula Left  Result Date: 08/04/2017 CLINICAL DATA:  Left leg pain, swelling and weeping. Area on anterior lower left leg and dorsal left foot that are open wounds with yellow-ish pus. Hx diabetes EXAM: LEFT TIBIA AND FIBULA - 2 VIEW COMPARISON:  None. FINDINGS: AP and lateral views of the tibia and fibula are provided. Osseous alignment is normal. Bone mineralization is normal. No acute or suspicious osseous lesion. Adjacent soft tissues are unremarkable. No soft tissue gas seen. IMPRESSION: Negative plain film examination of the LEFT tibia and fibula. No evidence of osteomyelitis. No soft tissue gas seen. Electronically Signed   By: Franki Cabot M.D.   On: 08/04/2017 13:46    Procedures Procedures (including critical care time)  Medications Ordered in ED Medications  clindamycin (CLEOCIN) IVPB 600 mg (600 mg Intravenous New Bag/Given 08/04/17 1408)     Initial Impression / Assessment and Plan / ED Course  I have reviewed the triage vital signs and the nursing notes.  Pertinent labs & imaging results that were available during my care of the patient were reviewed by me and considered in my medical decision making (see chart for details).     62 year old male who presents for evaluation of 3 to 4 days of progressively worsening left lower extremity edema, erythema and pain. Patient denies any preceding trauma, injury, fall.  States that the leg has been warm but denies any fevers.  EMS reported that when they picked him up from his house his blood systolic blood pressures in the 200s.  Since being in the ED, his blood pressures have remained in the 150s. Patient is afebrile, non-toxic appearing, sitting comfortably on examination table. Vital signs reviewed and stable.  Patient is neurovascularly  intact.  On exam, left lower extremity is edematous, erythematous, warm to touch.  There is evidence of small wound with purulent drainage noted to the  anterior aspect.  No crepitus noted. Concern for cellulitis. Given symptoms and asymmetry, also consider DVT though lower suspicion. Plan to check basic labs, U/S.   CBC shows leukocytosis of 17.7.  CMP shows hypokalemia 3.2.  Glucose is 237.  BUN is elevated at 28, creatinine is 2.00.  Alk phos is elevated at 291.  Last CMP 2 months ago shows hyperglycemia.  BUN and creatinine were 16 and 0.74 at that time, which appears to be his baseline.  Alk phos was previously elevated at 228. Lactic acid normal. CBC with leukocytosis of 17.7. Hgb is 12.0. Most recent Hgb is 13.1.   Given concerns for cellulitis, will start patient on IV clindamycin. Of note, Patient reports he stopped taking his diabetes medication approximately one year ago. He had previously been on metformin.   Ultrasound negative for any acute DVT.  Left tib-fib x-ray negative for any acute evidence of subcutaneous gas.  Discussed with hospitalist.  Will admit.   Final Clinical Impressions(s) / ED Diagnoses   Final diagnoses:  Cellulitis of left lower extremity    ED Discharge Orders    None       Desma Mcgregor 08/04/17 1434    Quintella Reichert, MD 08/08/17 [phone]

## 2017-08-04 NOTE — Progress Notes (Signed)
Tyrone BaileyMark Cunningham [id number]  Code Status: FULL  Admission Data: 08/04/2017 7:26 PM  Attending Provider: Ophelia CharterYates  NWG:[id number]:Weber, Dema SeverinSarah L, PA-C  Consults/ Treatment Team:   Tyrone BaileyMark Cunningham is a 62 y.o. male patient admitted from ED awake, alert - oriented X 4 - no acute distress noted. VSS - Blood pressure (!) 144/63, pulse 68, temperature 98.6 F (37 C), temperature source Oral, resp. rate 20, SpO2 98 %. no c/o shortness of breath, no c/o chest pain. Orientation to room, and floor completed with information packet given to patient. Patient declined safety video at this time. Admission INP armband ID verified with patient, and in place.  Fall assessment complete. Cellulitis noted on left leg.  No evidence of skin break down noted on exam.  ?  Will cont to eval and treat per MD orders.  Jon GillsElisa R Shiela Bruns, RN  08/04/2017 7:26 PM

## 2017-08-04 NOTE — ED Notes (Signed)
Respiratory therapist contacted to suction patient.

## 2017-08-04 NOTE — H&P (Signed)
History and Physical    Tyrone Cunningham ZOX:[id number] DOB: 04/05/1955 DOA: 08/04/2017  PCP: Tyrone Riddle, PA-C Consultants:  [facility] - ENT Patient coming from:  Home - lives with roommate at an Extended Stay hotel; NOK: Tyrone Cunningham, brother, [phone]  Chief Complaint:  Leg pain  HPI: Tyrone Cunningham is a 62 y.o. male with medical history significant of severe OSA requiring trach; HTN; HLD; DM; COPD; and CHF presenting with leg pain.  About a week ago, he noticed LLE symptoms and things got progressively worse.  He has been unable to even bear weight for the last 1-2 days.  It became increasingly hard for him to get up and transfer into his motorized chair.  "Little fevers, but I always carry a fever anyway.  I'm a little hotbox, I guess, like a little oven."  Denies wound or injury.  Denies h/o cellulitis.   ED Course:   LLE cellulitis.  3-4 days of worsening LLE swelling, redness, pain; no fever.  Sedentary, uses motorized wheelchair.  Stopped taking Metformin about 1 year ago - glucose 237.  Elevated BP.  3 cm wound on anterior lower leg.  WBC increased, acute renal failure.  Given IV Clindamycin.  LE [place] pending.  Review of Systems: As per HPI; otherwise review of systems reviewed and negative.   Ambulatory Status:  Transfers only  Past Medical History:  Diagnosis Date  . Bronchitis   . CHF (congestive heart failure) (HCC)    grade 1 diastolic with preserved EF in 5621  . COPD (chronic obstructive pulmonary disease) (HCC)   . DM type 2 with diabetic peripheral neuropathy (HCC)   . Hearing loss   . Hypercholesteremia   . Hypertension   . Peripheral neuropathy   . Sleep apnea    Severe sleep apnea requiring tracheostomy    Past Surgical History:  Procedure Laterality Date  . APPENDECTOMY    . CHOLECYSTECTOMY N/A 01/05/2014   Procedure: LAPAROSCOPIC CHOLECYSTECTOMY;  Surgeon: Emelia Loron, MD;  Location: [facility] ORS;  Service: General;  Laterality: N/A;  . FEMUR HARDWARE REMOVAL   09/01/2003   Removal of retained hardware, two interlocking distal femoral  . FEMUR SURGERY    . HAND SURGERY    . MULTIPLE EXTRACTIONS WITH ALVEOLOPLASTY N/A 05/11/2012   Procedure: MULTIPLE EXTRACTION WITH ALVEOLOPLASTY;  Surgeon: [name], DDS;  Location: [facility] OR;  Service: Oral Surgery;  Laterality: N/A;  . TRACHEOSTOMY      Social History   Socioeconomic History  . Marital status: Single    Spouse name: Not on file  . Number of children: 0  . Years of education: Not on file  . Highest education level: GED or equivalent  Occupational History  . Occupation: disabled  Social Needs  . Financial resource strain: Not hard at all  . Food insecurity:    Worry: Sometimes true    Inability: Sometimes true  . Transportation needs:    Medical: No    Non-medical: No  Tobacco Use  . Smoking status: Former Smoker    Packs/day: 2.00    Years: 30.00    Pack years: 60.00    Types: Cigarettes    Last attempt to quit: 07/02/1999    Years since quitting: 18.1  . Smokeless tobacco: Never Used  Substance and Sexual Activity  . Alcohol use: No    Comment: heavy drinker, quit in 1983  . Drug use: No    Comment: used "everything but heroin, I had my days"  .  Sexual activity: Never  Lifestyle  . Physical activity:    Days per week: 0 days    Minutes per session: 0 min  . Stress: Only a little  Relationships  . Social connections:    Talks on phone: Once a week    Gets together: Once a week    Attends religious service: Never    Active member of club or organization: No    Attends meetings of clubs or organizations: Never    Relationship status: Not on file  . Intimate partner violence:    Fear of current or ex partner: No    Emotionally abused: No    Physically abused: No    Forced sexual activity: No  Other Topics Concern  . Not on file  Social History Narrative   Lives with roommate in a hotel.   In wheelchair - minimal walking    No Known Allergies  Family History    Problem Relation Age of Onset  . Coronary artery disease Father     Prior to Admission medications   Medication Sig Start Date End Date Taking? Authorizing Provider  albuterol (PROVENTIL) (2.5 MG/3ML) 0.083% nebulizer solution Take 3 mLs (2.5 mg total) by nebulization 4 (four) times daily. 05/26/17  Yes Weber, [name], PA-C  OXYGEN Inhale 10 L into the lungs continuous.   Yes [provider]  traMADol (ULTRAM) 50 MG tablet Take 50 mg by mouth every 6 (six) hours as needed for moderate pain.   Yes [provider]  atorvastatin (LIPITOR) 20 MG tablet Take 1 tablet (20 mg total) by mouth daily. Patient not taking: Reported on 06/10/2017 05/26/17   Tyrone Riddle, PA-C  docusate sodium (COLACE) 100 MG capsule Take 1 capsule (100 mg total) by mouth 2 (two) times daily. Patient not taking: Reported on 06/10/2017 05/26/17   Tyrone Riddle, PA-C  gabapentin (NEURONTIN) 400 MG capsule Take 1 capsule (400 mg total) by mouth 3 (three) times daily. Patient not taking: Reported on 06/10/2017 05/29/17   Tyrone Riddle, PA-C  metFORMIN (GLUCOPHAGE XR) 500 MG 24 hr tablet Take 2 tablets (1,000 mg total) by mouth 2 (two) times daily. Patient not taking: Reported on 06/10/2017 05/26/17   Valarie Cones, Dema Severin, PA-C  Morphine-Naltrexone (EMBEDA) 30-1.2 MG CPCR Take 1 capsule by mouth 2 (two) times daily.     [provider]  sitaGLIPtin (JANUVIA) 100 MG tablet Take 1 tablet (100 mg total) by mouth daily. Patient not taking: Reported on 06/10/2017 05/26/17   Tyrone Riddle, PA-C  umeclidinium bromide (INCRUSE ELLIPTA) 62.5 MCG/INH AEPB Inhale 1 puff into the lungs daily. Patient not taking: Reported on 06/10/2017 05/26/17   Tyrone Riddle, PA-C    Physical Exam: Vitals:   08/04/17 1015 08/04/17 1107 08/04/17 1200 08/04/17 1245  BP: (!) 155/86 (!) 166/73 139/69 (!) 144/63  Pulse: 97 (!) 104 100 95  Resp: 18 17 (!) 22 20  Temp:      TempSrc:      SpO2: 97% 96% 97% 96%     General:  Appears  calm and comfortable and is NAD; intermittent coughing from his trach; mildly disheveled Eyes:  PERRL, EOMI, normal lids, iris ENT:  grossly normal hearing, lips & tongue, mmm; trach in place Neck:  no LAD, masses or thyromegaly Cardiovascular:  RRR, no m/r/g. No LE edema.  Respiratory:   CTA bilaterally with no wheezes/rales/rhonchi.  Normal respiratory effort. Abdomen:  soft, NT, ND, NABS Skin:  Diffuse LLE erythema  with mild edema, weeping serous fluid, scattered shallow ulcerations including an approximately 3 cm one on the anterior left lower leg.  There is also an ulceration on the left great toe.       Musculoskeletal:  grossly normal tone BUE/BLE, good ROM, no bony abnormality other than as described above Psychiatric:  grossly normal mood and affect, speech fluent and appropriate, AOx3 Neurologic:  CN 2-12 grossly intact, moves all extremities in coordinated fashion    Radiological Exams on Admission: Dg Tibia/fibula Left  Result Date: 08/04/2017 CLINICAL DATA:  Left leg pain, swelling and weeping. Area on anterior lower left leg and dorsal left foot that are open wounds with yellow-ish pus. Hx diabetes EXAM: LEFT TIBIA AND FIBULA - 2 VIEW COMPARISON:  None. FINDINGS: AP and lateral views of the tibia and fibula are provided. Osseous alignment is normal. Bone mineralization is normal. No acute or suspicious osseous lesion. Adjacent soft tissues are unremarkable. No soft tissue gas seen. IMPRESSION: Negative plain film examination of the LEFT tibia and fibula. No evidence of osteomyelitis. No soft tissue gas seen. Electronically Signed   By: Bary RichardStan  Maynard M.D.   On: 08/04/2017 13:46    EKG: Independently reviewed.  NSR with rate 94; RBBB and LAFB; nonspecific ST changes with no evidence of acute ischemia   Labs on Admission: I have personally reviewed the available labs and imaging studies at the time of the admission.  Pertinent labs:   K+ 3.2 Glucose 237 BUN 29/Creatinine  2.00/GFR 34; prior 16/0.74/98 on 3/26 AP 291, prior 228 on 3/26 Lactic 1.57 WBC 17.7 Hgb 12 A1c 8.6 on 3/26  Assessment/Plan Principal Problem:   Cellulitis and abscess of left leg Active Problems:   Hypertension   Hypercholesteremia   Tracheostomy dependent (HCC)   DM (diabetes mellitus), type 2 with neurological complications (HCC)   Obstructive sleep apnea   Diabetic foot ulcer (HCC)   Cellulitis -Patient with extensive erythema, edema, weeping of LLE -There are several superficial ulcerations along his leg but also a diabetic foot ulcer on his great toe -Elevated WBC count -Xray of the tib/fib does not show osteo or soft tissue gas -No DVT on US -He was given Clindamycin in the ER -By the cellulitis order set, treatment with Rocephin is reasonable -Will not give Clindamycin due to rapidly developing resistance for this antibiotic nationally -Wound care consult -Will admit, Med Surg  Acute renal failure:  -Baseline creatinine is 0.7.   -Today's creatinine is 2.0  -Likely due to prerenal failure secondary to dehydration and uncontrolled DM, HTN. -IVF  -Follow up renal function by BMP -Avoid ACEI and NSAIDs  Diabetic foot ulcer -There is a foot ulcer present that may have served as an entry for infection -It does not appear to be a deep infection, but this can be difficult to know with simple visualization -For now, will order great toe x-ray -He may need broadening of antibiotics for this issue if not improving with Rocephin monotherapy -Goal would be for glucose <150 to facilitate wound healing. -Patient should be on bed rest, non-weight bearing. -Excellent BP control is needed   DM -He stopped taking his medications about a year ago -He then had an A1c in 3/19 that was >8, clearly indicating a need for medication -He still has not resumed medication - possibly due to cost -Will cover with moderate-scale SSI for now -Diabetes education -CM  consult  HLD -Continue Lipitor (has not been taking, so resume) -LDL control in 3/19 -  99  HTN -Not apparently taking medication for this either -Consider starting, but this will depend in part on patient's willingness and ability to obtain medications  OSA -Has trach -Has home O2  Trach dependent respiratory failure -Supplemental O2 via trach collar as needed  DVT prophylaxis:  Lovenox  Code Status:  Full - confirmed with patient Family Communication:  None present Disposition Plan:  Home once clinically improved Consults called: CM; Diabetes education; wound care  Admission status: Admit - It is my clinical opinion that admission to INPATIENT is reasonable and necessary because of the expectation that this patient will require hospital care that crosses at least 2 midnights to treat this condition based on the medical complexity of the problems presented.  Given the aforementioned information, the predictability of an adverse outcome is felt to be significant.    Jonah Blue MD [facility]  If note is complete, please contact covering daytime or nighttime physician. [email or web address] Password [email or web address]  08/04/2017, 3:20 PM

## 2017-08-04 NOTE — ED Notes (Signed)
Patient transported to X-ray 

## 2017-08-04 NOTE — ED Notes (Signed)
Patient reports he has a hx of diabetes but stopped taking all meds about a year ago because they were not controlling his blood sugar

## 2017-08-04 NOTE — ED Notes (Signed)
Pt requesting oxygen, stating he wears it prn at home, O2 sat 98-100%, respiratory called for trach collar o2 mask

## 2017-08-04 NOTE — Progress Notes (Signed)
LLE venous duplex prelim: limited visualization due to calf edema and patient intolerance to venous compressions. No obvious DVT noted. Farrel DemarkJill Eunice, RDMS, RVT

## 2017-08-04 NOTE — ED Notes (Addendum)
Pt transported to vascular.  °

## 2017-08-04 NOTE — ED Triage Notes (Signed)
Pt arrives via [facility] from extended stay off of [facility], pt c/o left knee and ankle pain, ems reports edema and weeping present to leg. Pt has trach, 98% on room air, bp 236/118-pt states no meds for bp.

## 2017-08-04 NOTE — Progress Notes (Signed)
Charge nurse requested continuous pulse ox box from portable.

## 2017-08-05 ENCOUNTER — Encounter (HOSPITAL_COMMUNITY): Payer: Self-pay | Admitting: General Practice

## 2017-08-05 ENCOUNTER — Other Ambulatory Visit: Payer: Self-pay

## 2017-08-05 DIAGNOSIS — E1149 Type 2 diabetes mellitus with other diabetic neurological complication: Secondary | ICD-10-CM

## 2017-08-05 DIAGNOSIS — N179 Acute kidney failure, unspecified: Secondary | ICD-10-CM

## 2017-08-05 LAB — CBC
HCT: 35.3 % — ABNORMAL LOW (ref 39.0–52.0)
HEMOGLOBIN: 11.6 g/dL — AB (ref 13.0–17.0)
MCH: 29.1 pg (ref 26.0–34.0)
MCHC: 32.9 g/dL (ref 30.0–36.0)
MCV: 88.5 fL (ref 78.0–100.0)
PLATELETS: 304 10*3/uL (ref 150–400)
RBC: 3.99 MIL/uL — AB (ref 4.22–5.81)
RDW: 13.2 % (ref 11.5–15.5)
WBC: 16.7 10*3/uL — AB (ref 4.0–10.5)

## 2017-08-05 LAB — BASIC METABOLIC PANEL
ANION GAP: 8 (ref 5–15)
BUN: 22 mg/dL — ABNORMAL HIGH (ref 6–20)
CALCIUM: 8 mg/dL — AB (ref 8.9–10.3)
CO2: 29 mmol/L (ref 22–32)
CREATININE: 1.67 mg/dL — AB (ref 0.61–1.24)
Chloride: 102 mmol/L (ref 101–111)
GFR, EST AFRICAN AMERICAN: 48 mL/min — AB (ref >60)
GFR, EST NON AFRICAN AMERICAN: 42 mL/min — AB (ref >60)
Glucose, Bld: 120 mg/dL — ABNORMAL HIGH (ref 65–99)
Potassium: 2.9 mmol/L — ABNORMAL LOW (ref 3.5–5.1)
SODIUM: 139 mmol/L (ref 135–145)

## 2017-08-05 LAB — GLUCOSE, CAPILLARY
GLUCOSE-CAPILLARY: 118 mg/dL — AB (ref 65–99)
GLUCOSE-CAPILLARY: 200 mg/dL — AB (ref 65–99)
GLUCOSE-CAPILLARY: 173 mg/dL — AB (ref 65–99)
Glucose-Capillary: 178 mg/dL — ABNORMAL HIGH (ref 65–99)

## 2017-08-05 LAB — HIV ANTIBODY (ROUTINE TESTING W REFLEX): HIV SCREEN 4TH GENERATION: NONREACTIVE

## 2017-08-05 MED ORDER — POTASSIUM CHLORIDE CRYS ER 20 MEQ PO TBCR
40.0000 meq | EXTENDED_RELEASE_TABLET | ORAL | Status: AC
  Administered 2017-08-05 (×3): 40 meq via ORAL
  Filled 2017-08-05 (×3): qty 2

## 2017-08-05 MED ORDER — METOPROLOL TARTRATE 25 MG PO TABS
25.0000 mg | ORAL_TABLET | Freq: Two times a day (BID) | ORAL | Status: DC
  Administered 2017-08-05 – 2017-08-07 (×5): 25 mg via ORAL
  Filled 2017-08-05 (×5): qty 1

## 2017-08-05 MED ORDER — OXYCODONE HCL 5 MG PO TABS
10.0000 mg | ORAL_TABLET | ORAL | Status: DC | PRN
  Administered 2017-08-05 – 2017-08-14 (×36): 10 mg via ORAL
  Filled 2017-08-05 (×38): qty 2

## 2017-08-05 MED ORDER — ORAL CARE MOUTH RINSE
15.0000 mL | Freq: Two times a day (BID) | OROMUCOSAL | Status: DC
  Administered 2017-08-05 – 2017-08-07 (×6): 15 mL via OROMUCOSAL

## 2017-08-05 NOTE — Progress Notes (Signed)
Inpatient Diabetes Program Recommendations  AACE/ADA: New Consensus Statement on Inpatient Glycemic Control (2015)  Target Ranges:  Prepandial:   less than 140 mg/dL      Peak postprandial:   less than 180 mg/dL (1-2 hours)      Critically ill patients:  140 - 180 mg/dL   Lab Results  Component Value Date   GLUCAP 200 (H) 08/05/2017   HGBA1C 8.6 (H) 05/26/2017  Results for Brennan BaileySMITH, Christipher (MRN [id number]) as of 08/05/2017 19:10  Ref. Range 08/04/2016 14:14 05/26/2017 15:22  Hemoglobin A1C Latest Ref Range: 4.8 - 5.6 % 12.4 (H) 8.6 (H)    Late Entry-Review of Glycemic Control Results for Brennan BaileySMITH, Angelus (MRN [id number]) as of 08/05/2017 19:10  Ref. Range 08/04/2017 21:41 08/05/2017 07:58 08/05/2017 12:16 08/05/2017 16:36  Glucose-Capillary Latest Ref Range: 65 - 99 mg/dL 782258 (H) 956118 (H) 213178 (H) 200 (H)   Diabetes history: Type 2 DM  Outpatient Diabetes medications: Januvia 100 mg daily, Metformin 1000 mg bid-Patient has not taken in over a year Current orders for Inpatient glycemic control:  Novolog moderate tid with meals and HS  Inpatient Diabetes Program Recommendations:    Spoke with patient regarding A1C and DM.  He reports that he stopped taking all medications for his DM because "they were not helping".  He states that he saw no difference in blood sugars with or without medications.  He also states that since stopping medications, he has lost weight.  We discussed A1C results of 8.6% from March of 2019 (which as actually an improvement from last year).  Patient admits that he stopped taking medications for diabetes "a year ago".  We discussed possibility of him restarting at least the Metformin if appropriate to improve A1C. Patient did not mention financial constraints with medications however in the past this has been an issue.  Metformin is a generic medication that is usually on the 4$ list at most pharmacies.  He is rarely checking his blood sugars due to not wanting to stick himself. Will need f/u  with PCP for repeat A1C.    No further needs noted at this time.  Patient has several co-morbidities and diabetes does not seem to be as much of a priority to patient at this time.  He however is agreeable to possibly restarting Metformin if appropriate.    Thanks, Beryl MeagerJenny Sampson Self, RN, BC-ADM Inpatient Diabetes Coordinator Pager [phone] (8a-5p)

## 2017-08-05 NOTE — Progress Notes (Signed)
Pt asleep. No obvious respiratory distress noted at this time. RT will check back

## 2017-08-05 NOTE — Progress Notes (Signed)
PROGRESS NOTE                                                                                                                                                                                                             Patient Demographics:    Tyrone Cunningham, is a 62 y.o. male, DOB - [birth year], ZOX:[id number]  Admit date - 08/04/2017   Admitting Physician Jonah Blue, MD  Outpatient Primary MD for the patient is Weber, Sharene Skeans  LOS - 1   Chief Complaint  Patient presents with  . Leg Pain       Brief Narrative   63 y.o. male with medical history significant of severe OSA requiring trach; HTN; HLD; DM; COPD; and CHF presenting with leg pain.  About a week ago, he noticed LLE symptoms and things got progressively worse, he was admitted for left lower extremity cellulitis    Subjective:    Tyrone Cunningham today has, No headache, No chest pain, No abdominal pain - No Nausea, reports is feeling better, but still reports left lower extremity pain  Assessment  & Plan :    Principal Problem:   Cellulitis and abscess of left leg Active Problems:   Hypertension   Hypercholesteremia   Tracheostomy dependent (HCC)   DM (diabetes mellitus), type 2 with neurological complications (HCC)   Obstructive sleep apnea   Diabetic foot ulcer (HCC)   Acute renal failure (ARF) (HCC)    Left lower extremity cellulitis -Patient presents with low-grade temperature, extensive erythema, edema, and weeping of left lower extremity, has leukocytosis, venous Doppler with no evidence of DVT, will continue with IV Rocephin for treatment, follow blood cultures. -Patient with venous stasis ulcer, as well great toe foot ulcer, and they do not appear to be infected, continue with local wound care.  Acute renal failure:  - Baseline creatinine is 0.7.  Was 2 on admission, improving with gentle hydration, it is 1.6 today, continue to hold nephrotoxic medications -Avoid ACEI and  NSAIDs  Diabetic foot ulcer -There is a foot ulcer present that may have served as an entry for infection, does not appear to be infected, superficial, not deep, x-ray with no evidence of osteomyelitis   DM -He stopped taking his medications about a year ago -He then had an A1c in 3/19 that was >8, clearly indicating a  need for medication -He still has not resumed medication - possibly due to cost -Will cover with moderate-scale SSI for now -Diabetes education -CM consult  HLD -Continue Lipitor (has not been taking, so resume) -LDL control in 3/19 - 99  HTN -Not apparently taking medication for this either, blood pressure overall controlled, will start on low-dose metoprolol, ideally I would like to start him on ACE or ARB if renal function normalized.  OSA -Has trach -Has home O2  Trach dependent respiratory failure -Supplemental O2 via trach collar as needed      Code Status : Full code  Family Communication  : None at bedside  Disposition Plan  : Home once stable  Consults  : None  Procedures  : None  DVT Prophylaxis  : Lovenox  Lab Results  Component Value Date   PLT 304 08/05/2017    Antibiotics  :  Anti-infectives (From admission, onward)   Start     Dose/Rate Route Frequency Ordered Stop   08/04/17 1830  cefTRIAXone (ROCEPHIN) 2 g in sodium chloride 0.9 % 100 mL IVPB     2 g 200 mL/hr over 30 Minutes Intravenous Every 24 hours 08/04/17 1726     08/04/17 1200  clindamycin (CLEOCIN) IVPB 600 mg     600 mg 100 mL/hr over 30 Minutes Intravenous  Once 08/04/17 1159 08/04/17 1438        Objective:   Vitals:   08/05/17 0440 08/05/17 0801 08/05/17 1152 08/05/17 1258  BP: (!) 189/86   (!) 179/87  Pulse: 100 (!) 110 92 (!) 102  Resp: 20 20    Temp: 99.3 F (37.4 C)   99.6 F (37.6 C)  TempSrc: Oral   Oral  SpO2: 100% 98% 95%   Weight:      Height:        Wt Readings from Last 3 Encounters:  08/04/17 118.5 kg (261 lb 3.9 oz)  07/23/17  117.2 kg (258 lb 6.4 oz)  05/26/17 115.2 kg (254 lb)     Intake/Output Summary (Last 24 hours) at 08/05/2017 1421 Last data filed at 08/05/2017 1224 Gross per 24 hour  Intake 1185.75 ml  Output 2500 ml  Net -1314.25 ml     Physical Exam  Awake Alert, Oriented X 3, No new F.N deficits, Normal affect Trach +. Symmetrical Chest wall movement, Good air movement bilaterally, CTAB RRR,No Gallops,Rubs or new Murmurs, No Parasternal Heave +ve B.Sounds, Abd Soft, No tenderness,No rebound - guarding or rigidity. No Cyanosis, Clubbing, left lower extremity with significant erythema, warmth, swelling, has stasis ulcer anteriorly, does not appear to be infected,    Data Review:    CBC Recent Labs  Lab 08/04/17 1059 08/05/17 0609  WBC 17.7* 16.7*  HGB 12.0* 11.6*  HCT 36.1* 35.3*  PLT 266 304  MCV 87.2 88.5  MCH 29.0 29.1  MCHC 33.2 32.9  RDW 13.3 13.2  LYMPHSABS 1.4  --   MONOABS 1.1*  --   EOSABS 0.0  --   BASOSABS 0.0  --     Chemistries  Recent Labs  Lab 08/04/17 1059 08/05/17 0609  NA 138 139  K 3.2* 2.9*  CL 101 102  CO2 29 29  GLUCOSE 237* 120*  BUN 28* 22*  CREATININE 2.00* 1.67*  CALCIUM 8.0* 8.0*  AST 20  --   ALT 35  --   ALKPHOS 291*  --   BILITOT 0.6  --    ------------------------------------------------------------------------------------------------------------------ No results for input(s): CHOL, HDL, LDLCALC,  TRIG, CHOLHDL, LDLDIRECT in the last 72 hours.  Lab Results  Component Value Date   HGBA1C 8.6 (H) 05/26/2017   ------------------------------------------------------------------------------------------------------------------ No results for input(s): TSH, T4TOTAL, T3FREE, THYROIDAB in the last 72 hours.  Invalid input(s): FREET3 ------------------------------------------------------------------------------------------------------------------ No results for input(s): VITAMINB12, FOLATE, FERRITIN, TIBC, IRON, RETICCTPCT in the last 72  hours.  Coagulation profile No results for input(s): INR, PROTIME in the last 168 hours.  No results for input(s): DDIMER in the last 72 hours.  Cardiac Enzymes No results for input(s): CKMB, TROPONINI, MYOGLOBIN in the last 168 hours.  Invalid input(s): CK ------------------------------------------------------------------------------------------------------------------    Component Value Date/Time   BNP 40.4 04/29/2017 0930   BNP 136.0 (H) 03/21/2015 1131    Inpatient Medications  Scheduled Meds: . docusate sodium  100 mg Oral BID  . enoxaparin (LOVENOX) injection  40 mg Subcutaneous Q24H  . insulin aspart  0-15 Units Subcutaneous TID WC  . insulin aspart  0-5 Units Subcutaneous QHS  . mouth rinse  15 mL Mouth Rinse q12n4p   Continuous Infusions: . cefTRIAXone (ROCEPHIN)  IV 2 g (08/04/17 1801)  . lactated ringers 75 mL/hr at 08/05/17 0708   PRN Meds:.acetaminophen **OR** acetaminophen, albuterol, ondansetron **OR** ondansetron (ZOFRAN) IV, oxyCODONE  Micro Results No results found for this or any previous visit (from the past 240 hour(s)).  Radiology Reports Dg Tibia/fibula Left  Result Date: 08/04/2017 CLINICAL DATA:  Left leg pain, swelling and weeping. Area on anterior lower left leg and dorsal left foot that are open wounds with yellow-ish pus. Hx diabetes EXAM: LEFT TIBIA AND FIBULA - 2 VIEW COMPARISON:  None. FINDINGS: AP and lateral views of the tibia and fibula are provided. Osseous alignment is normal. Bone mineralization is normal. No acute or suspicious osseous lesion. Adjacent soft tissues are unremarkable. No soft tissue gas seen. IMPRESSION: Negative plain film examination of the LEFT tibia and fibula. No evidence of osteomyelitis. No soft tissue gas seen. Electronically Signed   By: Bary RichardStan  Maynard M.D.   On: 08/04/2017 13:46   Dg Toe Great Left  Result Date: 08/04/2017 CLINICAL DATA:  Great toe ulcer. EXAM: LEFT GREAT TOE COMPARISON:  None. FINDINGS: No acute  fracture or dislocation. Moderate first MTP joint space narrowing with marginal osteophyte formation. No osteolysis or cortical destruction. Vascular calcifications. IMPRESSION: 1. No radiographic evidence of osteomyelitis. 2. Moderate first MTP joint osteoarthritis. Electronically Signed   By: Obie DredgeWilliam T Derry M.D.   On: 08/04/2017 16:37      Tyrone Bienenstockawood Elie Leppo M.D on 08/05/2017 at 2:21 PM  Between 7am to 7pm - Pager - [phone]  After 7pm go to [email or web address] - password [email or web address]  [facility] -  Office  [phone]

## 2017-08-05 NOTE — Consult Note (Signed)
WOC Nurse wound consult note Reason for Consult: LLE Cellulitis with anterior LE wound (full thickness) and left great toe full thickness ulcerations with surrounding callous.  Two other wounds on the left anterior lateral foot. Ultrasound reveals no obvious DVT. Wound type: Infectious, venous insufficiency Pressure Injury POA: N/A Measurement: Left anterior foot with 5cm x 4cm full thickness wound with depth obscured by the presence of white nonviable tissue surrounding skin is macerated, erythematous and edematous. Small amount of serous to light yellow educate on old foam dressing. Left foot, great toe plantar aspect at lateral edge:  Full thickness wound measures 2cm x 1.2cm x 0.1cm with no bone palpable.  Periwound callus.  No drainage.  Red, dry wound bed. Left anterior foot, lateral aspect: two full thickness wounds, the largest of which measures 1.6cm x 0.4cm with dry red wound beds.  Edematous foot places these two injuries at risk for separation. Wound bed:As described above Drainage (amount, consistency, odor) As described above Periwound:As described above Dressing procedure/placement/frequency: I have provided guidance for topical care using xeroform gauze twice daily for its antimicrobial and astringent properties.  The petrolatum base will also address the periwound maceration and no sensitivity is anticipated. This will be covered from toe to knee with Kerlix roll gauze and topped with mild compression using an ACE bandaging system.  Elevation of heel, protection of foot and correction of lateral foot rotation will be addressed with Prevalon Boot.  A chair pad is provided for pressure redistribution while OOB in chair. Systemic antibiotics, nutrition and medication counseling will further impact wound healing positively.  WOC nursing team will not follow, but will remain available to this patient, the nursing and medical teams.  Please re-consult if needed. Thanks, Ladona MowLaurie Armeda Plumb,  MSN, RN, GNP, Hans EdenCWOCN, CWON-AP, FAAN  Pager# [phone]

## 2017-08-06 LAB — BASIC METABOLIC PANEL
Anion gap: 12 (ref 5–15)
BUN: 21 mg/dL — ABNORMAL HIGH (ref 6–20)
CALCIUM: 8.5 mg/dL — AB (ref 8.9–10.3)
CO2: 29 mmol/L (ref 22–32)
CREATININE: 1.72 mg/dL — AB (ref 0.61–1.24)
Chloride: 100 mmol/L — ABNORMAL LOW (ref 101–111)
GFR calc non Af Amer: 40 mL/min — ABNORMAL LOW (ref >60)
GFR, EST AFRICAN AMERICAN: 47 mL/min — AB (ref >60)
Glucose, Bld: 163 mg/dL — ABNORMAL HIGH (ref 65–99)
Potassium: 4.6 mmol/L (ref 3.5–5.1)
Sodium: 141 mmol/L (ref 135–145)

## 2017-08-06 LAB — CBC
HEMATOCRIT: 37.8 % — AB (ref 39.0–52.0)
Hemoglobin: 12.2 g/dL — ABNORMAL LOW (ref 13.0–17.0)
MCH: 28.6 pg (ref 26.0–34.0)
MCHC: 32.3 g/dL (ref 30.0–36.0)
MCV: 88.7 fL (ref 78.0–100.0)
PLATELETS: 332 10*3/uL (ref 150–400)
RBC: 4.26 MIL/uL (ref 4.22–5.81)
RDW: 13.1 % (ref 11.5–15.5)
WBC: 15.1 10*3/uL — ABNORMAL HIGH (ref 4.0–10.5)

## 2017-08-06 LAB — GLUCOSE, CAPILLARY
GLUCOSE-CAPILLARY: 165 mg/dL — AB (ref 65–99)
GLUCOSE-CAPILLARY: 134 mg/dL — AB (ref 65–99)
GLUCOSE-CAPILLARY: 278 mg/dL — AB (ref 65–99)
GLUCOSE-CAPILLARY: 141 mg/dL — AB (ref 65–99)

## 2017-08-06 LAB — PATHOLOGIST SMEAR REVIEW

## 2017-08-06 LAB — MAGNESIUM: Magnesium: 1.5 mg/dL — ABNORMAL LOW (ref 1.7–2.4)

## 2017-08-06 NOTE — Care Management Note (Signed)
Case Management Note  Patient Details  Name: Tyrone Cunningham MRN: [id number] Date of Birth: 11/14/1955  Subjective/Objective:  Cellulitis and abscess of the left left               Action/Plan: Outpatient Primary MD is Valarie ConesWeber, Sharene SkeansSarah L, PA-C; has private insurance with Medicare; CM following for progression of care  Expected Discharge Date:    possibly 08/10/2017             Expected Discharge Plan:  Home w Home Health Services  Discharge planning Services  CM Consult  Status of Service:   In progress  Reola MosherChandler, Stefan Karen L, RN,MHA,BSN [phone] 08/06/2017, 2:31 PM

## 2017-08-06 NOTE — Progress Notes (Signed)
PROGRESS NOTE                                                                                                                                                                                                             Patient Demographics:    Tyrone Cunningham, is a 62 y.o. male, DOB - [birth year], ONG:[id number]  Admit date - 08/04/2017   Admitting Physician Jonah Blue, MD  Outpatient Primary MD for the patient is Weber, Sharene Skeans  LOS - 2   Chief Complaint  Patient presents with  . Leg Pain       Brief Narrative   62 y.o. male with medical history significant of severe OSA requiring trach; HTN; HLD; DM; COPD; and CHF presenting with leg pain.  About a week ago, he noticed LLE symptoms and things got progressively worse, he was admitted for left lower extremity cellulitis    Subjective:    Tyrone Cunningham today has, No headache, No chest pain, No abdominal pain - No Nausea, reports is feeling better, but still reports left lower extremity pain  Assessment  & Plan :    Principal Problem:   Cellulitis and abscess of left leg Active Problems:   Hypertension   Hypercholesteremia   Tracheostomy dependent (HCC)   DM (diabetes mellitus), type 2 with neurological complications (HCC)   Obstructive sleep apnea   Diabetic foot ulcer (HCC)   Acute renal failure (ARF) (HCC)    Left lower extremity cellulitis -Patient presents with low-grade temperature, extensive erythema, edema, and weeping of left lower extremity, has leukocytosis, venous Doppler with no evidence of DVT, appears to be improving, continue with IV Rocephin for now, wound care consult appreciated, continue with leg elevation .and local wound care .   Acute renal failure:  - Baseline creatinine is 0.7.  Was 2 on admission, is improving with gentle hydration, it is 1.7 today . -Avoid ACEI and NSAIDs  Diabetic foot ulcer -There is a foot ulcer present that may have served as an entry  for infection, does not appear to be infected, superficial, not deep, x-ray with no evidence of osteomyelitis   DM -He stopped taking his medications about a year ago, as he thinks it is unhelpful, his A1c March 2019 more than 8, but now we will continue with insulin sliding scale, and when renal function  improves further will start on metformin. -Diabetic consult appreciated   HLD -Continue Lipitor -LDL control in 3/19 - 99  HTN -Blood pressure better controlled after starting low-dose metoprolol, continue current dose, and if remains elevated will increase the dose, ideally I would like to start on ACE or ARB if renal function normalize .  OSA -Has trach -Has home O2  Trach dependent respiratory failure -Supplemental O2 via trach collar as needed      Code Status : Full code  Family Communication  : None at bedside  Disposition Plan  : Home once stable  Consults  : None  Procedures  : None  DVT Prophylaxis  : Lovenox  Lab Results  Component Value Date   PLT 332 08/06/2017    Antibiotics  :  Anti-infectives (From admission, onward)   Start     Dose/Rate Route Frequency Ordered Stop   08/04/17 1830  cefTRIAXone (ROCEPHIN) 2 g in sodium chloride 0.9 % 100 mL IVPB     2 g 200 mL/hr over 30 Minutes Intravenous Every 24 hours 08/04/17 1726     08/04/17 1200  clindamycin (CLEOCIN) IVPB 600 mg     600 mg 100 mL/hr over 30 Minutes Intravenous  Once 08/04/17 1159 08/04/17 1438        Objective:   Vitals:   08/06/17 0536 08/06/17 0759 08/06/17 1300 08/06/17 1416  BP: (!) 146/70   (!) 142/74  Pulse: 81 90 89 92  Resp: 16 16 16 16   Temp: 98.8 F (37.1 C)   98.5 F (36.9 C)  TempSrc:    Oral  SpO2: 98% 98%  98%  Weight:      Height:        Wt Readings from Last 3 Encounters:  08/04/17 118.5 kg (261 lb 3.9 oz)  07/23/17 117.2 kg (258 lb 6.4 oz)  05/26/17 115.2 kg (254 lb)     Intake/Output Summary (Last 24 hours) at 08/06/2017 1440 Last data filed  at 08/06/2017 1400 Gross per 24 hour  Intake 1104 ml  Output 3940 ml  Net -2836 ml     Physical Exam  Awake Alert, Oriented X 3, No new F.N deficits, Normal affect Trach +, no secretions Symmetrical Chest wall movement, Good air movement bilaterally, CTAB RRR,No Gallops,Rubs or new Murmurs, No Parasternal Heave +ve B.Sounds, Abd Soft, No tenderness,- guarding or rigidity. No Cyanosis, Clubbing or edema, right lower extremity wrapped, left lower extremity cellulitis is improving     Data Review:    CBC Recent Labs  Lab 08/04/17 1059 08/05/17 0609 08/06/17 0708  WBC 17.7* 16.7* 15.1*  HGB 12.0* 11.6* 12.2*  HCT 36.1* 35.3* 37.8*  PLT 266 304 332  MCV 87.2 88.5 88.7  MCH 29.0 29.1 28.6  MCHC 33.2 32.9 32.3  RDW 13.3 13.2 13.1  LYMPHSABS 1.4  --   --   MONOABS 1.1*  --   --   EOSABS 0.0  --   --   BASOSABS 0.0  --   --     Chemistries  Recent Labs  Lab 08/04/17 1059 08/05/17 0609 08/06/17 0708  NA 138 139 141  K 3.2* 2.9* 4.6  CL 101 102 100*  CO2 29 29 29   GLUCOSE 237* 120* 163*  BUN 28* 22* 21*  CREATININE 2.00* 1.67* 1.72*  CALCIUM 8.0* 8.0* 8.5*  MG  --   --  1.5*  AST 20  --   --   ALT 35  --   --  ALKPHOS 291*  --   --   BILITOT 0.6  --   --    ------------------------------------------------------------------------------------------------------------------ No results for input(s): CHOL, HDL, LDLCALC, TRIG, CHOLHDL, LDLDIRECT in the last 72 hours.  Lab Results  Component Value Date   HGBA1C 8.6 (H) 05/26/2017   ------------------------------------------------------------------------------------------------------------------ No results for input(s): TSH, T4TOTAL, T3FREE, THYROIDAB in the last 72 hours.  Invalid input(s): FREET3 ------------------------------------------------------------------------------------------------------------------ No results for input(s): VITAMINB12, FOLATE, FERRITIN, TIBC, IRON, RETICCTPCT in the last 72  hours.  Coagulation profile No results for input(s): INR, PROTIME in the last 168 hours.  No results for input(s): DDIMER in the last 72 hours.  Cardiac Enzymes No results for input(s): CKMB, TROPONINI, MYOGLOBIN in the last 168 hours.  Invalid input(s): CK ------------------------------------------------------------------------------------------------------------------    Component Value Date/Time   BNP 40.4 04/29/2017 0930   BNP 136.0 (H) 03/21/2015 1131    Inpatient Medications  Scheduled Meds: . docusate sodium  100 mg Oral BID  . enoxaparin (LOVENOX) injection  40 mg Subcutaneous Q24H  . insulin aspart  0-15 Units Subcutaneous TID WC  . insulin aspart  0-5 Units Subcutaneous QHS  . mouth rinse  15 mL Mouth Rinse q12n4p  . metoprolol tartrate  25 mg Oral BID   Continuous Infusions: . cefTRIAXone (ROCEPHIN)  IV Stopped (08/05/17 1923)  . lactated ringers 50 mL/hr at 08/05/17 1644   PRN Meds:.acetaminophen **OR** acetaminophen, albuterol, ondansetron **OR** ondansetron (ZOFRAN) IV, oxyCODONE  Micro Results No results found for this or any previous visit (from the past 240 hour(s)).  Radiology Reports Dg Tibia/fibula Left  Result Date: 08/04/2017 CLINICAL DATA:  Left leg pain, swelling and weeping. Area on anterior lower left leg and dorsal left foot that are open wounds with yellow-ish pus. Hx diabetes EXAM: LEFT TIBIA AND FIBULA - 2 VIEW COMPARISON:  None. FINDINGS: AP and lateral views of the tibia and fibula are provided. Osseous alignment is normal. Bone mineralization is normal. No acute or suspicious osseous lesion. Adjacent soft tissues are unremarkable. No soft tissue gas seen. IMPRESSION: Negative plain film examination of the LEFT tibia and fibula. No evidence of osteomyelitis. No soft tissue gas seen. Electronically Signed   By: Bary Richard M.D.   On: 08/04/2017 13:46   Dg Toe Great Left  Result Date: 08/04/2017 CLINICAL DATA:  Great toe ulcer. EXAM: LEFT  GREAT TOE COMPARISON:  None. FINDINGS: No acute fracture or dislocation. Moderate first MTP joint space narrowing with marginal osteophyte formation. No osteolysis or cortical destruction. Vascular calcifications. IMPRESSION: 1. No radiographic evidence of osteomyelitis. 2. Moderate first MTP joint osteoarthritis. Electronically Signed   By: Obie Dredge M.D.   On: 08/04/2017 16:37      Huey Bienenstock M.D on 08/06/2017 at 2:40 PM  Between 7am to 7pm - Pager - [phone]  After 7pm go to [email or web address] - password [facility]  [facility] -  Office  [phone]

## 2017-08-06 NOTE — Progress Notes (Signed)
patient c/o SOB sats 93% on RA. Restarted oxygen via trach collar at 5L/28%

## 2017-08-07 ENCOUNTER — Telehealth: Payer: Self-pay

## 2017-08-07 ENCOUNTER — Inpatient Hospital Stay (HOSPITAL_COMMUNITY): Payer: Medicare Other

## 2017-08-07 DIAGNOSIS — I5033 Acute on chronic diastolic (congestive) heart failure: Secondary | ICD-10-CM

## 2017-08-07 DIAGNOSIS — Z930 Tracheostomy status: Secondary | ICD-10-CM

## 2017-08-07 LAB — BASIC METABOLIC PANEL
ANION GAP: 6 (ref 5–15)
BUN: 22 mg/dL — ABNORMAL HIGH (ref 6–20)
CO2: 32 mmol/L (ref 22–32)
Calcium: 8.1 mg/dL — ABNORMAL LOW (ref 8.9–10.3)
Chloride: 101 mmol/L (ref 101–111)
Creatinine, Ser: 1.76 mg/dL — ABNORMAL HIGH (ref 0.61–1.24)
GFR, EST AFRICAN AMERICAN: 45 mL/min — AB (ref >60)
GFR, EST NON AFRICAN AMERICAN: 39 mL/min — AB (ref >60)
Glucose, Bld: 139 mg/dL — ABNORMAL HIGH (ref 65–99)
POTASSIUM: 3.8 mmol/L (ref 3.5–5.1)
SODIUM: 139 mmol/L (ref 135–145)

## 2017-08-07 LAB — CBC
HCT: 35.2 % — ABNORMAL LOW (ref 39.0–52.0)
Hemoglobin: 11.5 g/dL — ABNORMAL LOW (ref 13.0–17.0)
MCH: 28.8 pg (ref 26.0–34.0)
MCHC: 32.7 g/dL (ref 30.0–36.0)
MCV: 88.2 fL (ref 78.0–100.0)
PLATELETS: 312 10*3/uL (ref 150–400)
RBC: 3.99 MIL/uL — AB (ref 4.22–5.81)
RDW: 13 % (ref 11.5–15.5)
WBC: 16.8 10*3/uL — AB (ref 4.0–10.5)

## 2017-08-07 LAB — GLUCOSE, CAPILLARY
GLUCOSE-CAPILLARY: 138 mg/dL — AB (ref 65–99)
GLUCOSE-CAPILLARY: 210 mg/dL — AB (ref 65–99)
GLUCOSE-CAPILLARY: 167 mg/dL — AB (ref 65–99)
GLUCOSE-CAPILLARY: 226 mg/dL — AB (ref 65–99)

## 2017-08-07 MED ORDER — FUROSEMIDE 10 MG/ML IJ SOLN
40.0000 mg | Freq: Two times a day (BID) | INTRAMUSCULAR | Status: DC
  Administered 2017-08-07 – 2017-08-09 (×5): 40 mg via INTRAVENOUS
  Filled 2017-08-07 (×5): qty 4

## 2017-08-07 MED ORDER — POTASSIUM CHLORIDE CRYS ER 20 MEQ PO TBCR
40.0000 meq | EXTENDED_RELEASE_TABLET | Freq: Once | ORAL | Status: AC
  Administered 2017-08-07: 40 meq via ORAL
  Filled 2017-08-07: qty 2

## 2017-08-07 MED ORDER — METOPROLOL TARTRATE 50 MG PO TABS
50.0000 mg | ORAL_TABLET | Freq: Two times a day (BID) | ORAL | Status: DC
  Administered 2017-08-07 – 2017-08-09 (×4): 50 mg via ORAL
  Filled 2017-08-07 (×4): qty 1

## 2017-08-07 NOTE — Care Management Note (Signed)
Case Management Note  Patient Details  Name: Tyrone BaileyMark Mayberry MRN: [id number] Date of Birth: 10/29/1955  Subjective/Objective:                 Spoke with patient at bedside. He states that he currently lives at [facility] extended stay hotel [address] with a roommate. He has a trach that he self manages. [facility] He requested a [name] consult for housing resources, order placed. Admitted with Cellulitis, doubt he will have good self care and monitoring at home. Should consider HH RN [name] at DC.   Action/Plan:  May need HH RN [name] at dc, would like to use [facility] if needed.   Expected Discharge Date:                  Expected Discharge Plan:  Home w Home Health Services  In-House Referral:     Discharge planning Services  CM Consult  Post Acute Care Choice:    Choice offered to:     DME Arranged:    DME Agency:     HH Arranged:    HH Agency:     Status of Service:     If discussed at [employer] Meetings, dates discussed:    Additional Comments:  Lawerance SabalDebbie Aira Sallade, RN 08/07/2017, 12:10 PM

## 2017-08-07 NOTE — Telephone Encounter (Signed)
Copied from CRM [id number]. Topic: Inquiry >> Aug 06, 2017  3:57 PM Yvonna Alanisobinson, Andra M wrote: Reason for CRM: Patient wants Tyrone Cunningham to know that he is in dire need for his power wheel chair. [employer] needs an Occupational psychologistapproval from Tyrone Cunningham. Patient is in the hospital at phone number [phone]. Patient requests that Maralyn SagoSarah give him a call.       Thank You!!!  >> Aug 07, 2017  8:45 AM Janace HoardHinson, Shannon R wrote: The patient must have an OV before they can place an order for the wheelchair.  Medicare requires this.

## 2017-08-07 NOTE — Telephone Encounter (Signed)
Please schedule patient for office visit.

## 2017-08-07 NOTE — Care Management Important Message (Signed)
Important Message  Patient Details  Name: Tyrone BaileyMark Cunningham MRN: [id number] Date of Birth: 03/27/1955   Medicare Important Message Given:  Yes    Maci Eickholt 08/07/2017, 4:07 PM

## 2017-08-07 NOTE — Progress Notes (Addendum)
PROGRESS NOTE                                                                                                                                                                                                             Patient Demographics:    Tyrone Cunningham, is a 62 y.o. male, DOB - 1955-04-17, ZOX:[id number]  Admit date - 08/04/2017   Admitting Physician Tyrone Blue, MD  Outpatient Primary MD for the patient is Tyrone Cunningham, Tyrone Cunningham  LOS - 3   Chief Complaint  Patient presents with  . Leg Pain       Brief Narrative   62 y.o. male with medical history significant of severe OSA requiring trach; HTN; HLD; DM; COPD; and CHF presenting with leg pain.  About a week ago, he noticed LLE symptoms and things got progressively worse, he was admitted for left lower extremity cellulitis   Subjective:    Tyrone Cunningham today has, No headache, No chest pain, No abdominal pain - No Nausea, he did have some dyspnea earlier today but he required some oxygen .  Assessment  & Plan :    Principal Problem:   Cellulitis and abscess of left leg Active Problems:   Hypertension   Hypercholesteremia   Tracheostomy dependent (HCC)   DM (diabetes mellitus), type 2 with neurological complications (HCC)   Obstructive sleep apnea   Diabetic foot ulcer (HCC)   Acute renal failure (ARF) (HCC)    Left lower extremity cellulitis -Patient presents with low-grade temperature, extensive erythema, edema, and weeping of left lower extremity, has leukocytosis, venous Doppler with no evidence of DVT, appears to be improving, continue with IV Rocephin for now, wound care consult appreciated, continue with leg elevation .and local wound care .  Acute on chronic diastolic CHF -2D echo in 2017 showing EF of 65% with grade 1 diastolic dysfunction, patient on IV fluids for last couple days for renal failure, he has worsening lower extremity edema, increased work of breathing this a.m., I will  start on IV Lasix 40 mg twice daily,   Acute renal failure:  - Baseline creatinine is 0.7.  Was 2 on admission, has improved with gentle hydration, it is 1.7 today, will continue to monitor closely given patient is started on IV diuresis -Avoid ACEI and NSAIDs  Diabetic foot ulcer -There is a foot  ulcer present that may have served as an entry for infection, does not appear to be infected, superficial, not deep, x-ray with no evidence of osteomyelitis   DM -He stopped taking his medications about a year ago, as he thinks it is unhelpful, his A1c March 2019 more than 8, but now we will continue with insulin sliding scale, and when renal function improves further will start on metformin. -Diabetic consult appreciated  Hypokalemia -Patient with potassium as low as 2.8, which has been repleted, now he is on IV diuresis I will give 40 of p.o. potassium today  HLD -Continue Lipitor -LDL control in 3/19 - 99  HTN -Started low-dose metoprolol, blood pressure remains increased, I will increase metoprolol to 50 mg twice daily , ideally I would like to start on ACE or ARB if renal function normalize .  OSA -Has trach -Has home O2  Trach dependent respiratory failure -Supplemental O2 via trach collar as needed, he is with increased work of breathing this morning, chest x-ray with no acute findings, started on diuresis      Code Status : Full code  Family Communication  : None at bedside  Disposition Plan  : Home once stable  Consults  : None  Procedures  : None  DVT Prophylaxis  : Lovenox  Lab Results  Component Value Date   PLT 312 08/07/2017    Antibiotics  :  Anti-infectives (From admission, onward)   Start     Dose/Rate Route Frequency Ordered Stop   08/04/17 1830  cefTRIAXone (ROCEPHIN) 2 g in sodium chloride 0.9 % 100 mL IVPB     2 g 200 mL/hr over 30 Minutes Intravenous Every 24 hours 08/04/17 1726     08/04/17 1200  clindamycin (CLEOCIN) IVPB 600 mg      600 mg 100 mL/hr over 30 Minutes Intravenous  Once 08/04/17 1159 08/04/17 1438        Objective:   Vitals:   08/07/17 0541 08/07/17 0820 08/07/17 1141 08/07/17 1411  BP: (!) 168/72   (!) 175/79  Pulse: (!) 102 92 87 85  Resp: 20 18 18 18   Temp: 99 F (37.2 C)   (!) 97.5 F (36.4 C)  TempSrc:    Oral  SpO2: 100% 100% 100% 99%  Weight:      Height:        Wt Readings from Last 3 Encounters:  08/04/17 118.5 kg (261 lb 3.9 oz)  07/23/17 117.2 kg (258 lb 6.4 oz)  05/26/17 115.2 kg (254 lb)     Intake/Output Summary (Last 24 hours) at 08/07/2017 1528 Last data filed at 08/07/2017 1030 Gross per 24 hour  Intake 3133.33 ml  Output 3200 ml  Net -66.67 ml     Physical Exam   Awake Alert, Oriented X 3, No new F.N deficits, Normal affect Trach +, no secretions Good air entry bilaterally, no wheezing crackles,  Regular rate and rhythms, no rubs murmurs gallops  +ve B.Sounds, Abd Soft, No tenderness,- guarding or rigidity. No Cyanosis, Clubbing, he started to develop lower extremity edema,left lower extremity is currently wrapped with Kerlix     Data Review:    CBC Recent Labs  Lab 08/04/17 1059 08/05/17 0609 08/06/17 0708 08/07/17 0644  WBC 17.7* 16.7* 15.1* 16.8*  HGB 12.0* 11.6* 12.2* 11.5*  HCT 36.1* 35.3* 37.8* 35.2*  PLT 266 304 332 312  MCV 87.2 88.5 88.7 88.2  MCH 29.0 29.1 28.6 28.8  MCHC 33.2 32.9 32.3 32.7  RDW 13.3  13.2 13.1 13.0  LYMPHSABS 1.4  --   --   --   MONOABS 1.1*  --   --   --   EOSABS 0.0  --   --   --   BASOSABS 0.0  --   --   --     Chemistries  Recent Labs  Lab 08/04/17 1059 08/05/17 0609 08/06/17 0708 08/07/17 0644  NA 138 139 141 139  K 3.2* 2.9* 4.6 3.8  CL 101 102 100* 101  CO2 29 29 29  32  GLUCOSE 237* 120* 163* 139*  BUN 28* 22* 21* 22*  CREATININE 2.00* 1.67* 1.72* 1.76*  CALCIUM 8.0* 8.0* 8.5* 8.1*  MG  --   --  1.5*  --   AST 20  --   --   --   ALT 35  --   --   --   ALKPHOS 291*  --   --   --   BILITOT 0.6   --   --   --    ------------------------------------------------------------------------------------------------------------------ No results for input(s): CHOL, HDL, LDLCALC, TRIG, CHOLHDL, LDLDIRECT in the last 72 hours.  Lab Results  Component Value Date   HGBA1C 8.6 (H) 05/26/2017   ------------------------------------------------------------------------------------------------------------------ No results for input(s): TSH, T4TOTAL, T3FREE, THYROIDAB in the last 72 hours.  Invalid input(s): FREET3 ------------------------------------------------------------------------------------------------------------------ No results for input(s): VITAMINB12, FOLATE, FERRITIN, TIBC, IRON, RETICCTPCT in the last 72 hours.  Coagulation profile No results for input(s): INR, PROTIME in the last 168 hours.  No results for input(s): DDIMER in the last 72 hours.  Cardiac Enzymes No results for input(s): CKMB, TROPONINI, MYOGLOBIN in the last 168 hours.  Invalid input(s): CK ------------------------------------------------------------------------------------------------------------------    Component Value Date/Time   BNP 40.4 04/29/2017 0930   BNP 136.0 (H) 03/21/2015 1131    Inpatient Medications  Scheduled Meds: . docusate sodium  100 mg Oral BID  . enoxaparin (LOVENOX) injection  40 mg Subcutaneous Q24H  . furosemide  40 mg Intravenous BID  . insulin aspart  0-15 Units Subcutaneous TID WC  . insulin aspart  0-5 Units Subcutaneous QHS  . mouth rinse  15 mL Mouth Rinse q12n4p  . metoprolol tartrate  25 mg Oral BID   Continuous Infusions: . cefTRIAXone (ROCEPHIN)  IV Stopped (08/06/17 1908)  . lactated ringers 50 mL/hr at 08/05/17 1644   PRN Meds:.acetaminophen **OR** acetaminophen, albuterol, ondansetron **OR** ondansetron (ZOFRAN) IV, oxyCODONE  Micro Results No results found for this or any previous visit (from the past 240 hour(s)).  Radiology Reports Dg Tibia/fibula  Left  Result Date: 08/04/2017 CLINICAL DATA:  Left leg pain, swelling and weeping. Area on anterior lower left leg and dorsal left foot that are open wounds with yellow-ish pus. Hx diabetes EXAM: LEFT TIBIA AND FIBULA - 2 VIEW COMPARISON:  None. FINDINGS: AP and lateral views of the tibia and fibula are provided. Osseous alignment is normal. Bone mineralization is normal. No acute or suspicious osseous lesion. Adjacent soft tissues are unremarkable. No soft tissue gas seen. IMPRESSION: Negative plain film examination of the LEFT tibia and fibula. No evidence of osteomyelitis. No soft tissue gas seen. Electronically Signed   By: Bary RichardStan  Maynard M.D.   On: 08/04/2017 13:46   Dg Chest Port 1 View  Result Date: 08/07/2017 CLINICAL DATA:  Shortness of breath EXAM: PORTABLE CHEST 1 VIEW COMPARISON:  04/29/2017 FINDINGS: Tracheostomy is unchanged. Mild cardiomegaly. No confluent airspace opacities or effusions. No acute bony abnormality. IMPRESSION: Borderline heart size.  No active disease.  Electronically Signed   By: Charlett Nose M.D.   On: 08/07/2017 09:13   Dg Toe Great Left  Result Date: 08/04/2017 CLINICAL DATA:  Great toe ulcer. EXAM: LEFT GREAT TOE COMPARISON:  None. FINDINGS: No acute fracture or dislocation. Moderate first MTP joint space narrowing with marginal osteophyte formation. No osteolysis or cortical destruction. Vascular calcifications. IMPRESSION: 1. No radiographic evidence of osteomyelitis. 2. Moderate first MTP joint osteoarthritis. Electronically Signed   By: Obie Dredge M.D.   On: 08/04/2017 16:37      Huey Bienenstock M.D on 08/07/2017 at 3:28 PM  Between 7am to 7pm - Pager - [phone]  After 7pm go to [email or web address] - password [facility]  [facility] -  Office  [phone]

## 2017-08-08 LAB — BASIC METABOLIC PANEL
Anion gap: 10 (ref 5–15)
BUN: 21 mg/dL — ABNORMAL HIGH (ref 6–20)
CHLORIDE: 97 mmol/L — AB (ref 101–111)
CO2: 30 mmol/L (ref 22–32)
CREATININE: 1.66 mg/dL — AB (ref 0.61–1.24)
Calcium: 8.2 mg/dL — ABNORMAL LOW (ref 8.9–10.3)
GFR calc Af Amer: 49 mL/min — ABNORMAL LOW (ref >60)
GFR, EST NON AFRICAN AMERICAN: 42 mL/min — AB (ref >60)
Glucose, Bld: 224 mg/dL — ABNORMAL HIGH (ref 65–99)
POTASSIUM: 3.8 mmol/L (ref 3.5–5.1)
SODIUM: 137 mmol/L (ref 135–145)

## 2017-08-08 LAB — CBC
HCT: 34.7 % — ABNORMAL LOW (ref 39.0–52.0)
HEMOGLOBIN: 11.2 g/dL — AB (ref 13.0–17.0)
MCH: 28.7 pg (ref 26.0–34.0)
MCHC: 32.3 g/dL (ref 30.0–36.0)
MCV: 89 fL (ref 78.0–100.0)
PLATELETS: 312 10*3/uL (ref 150–400)
RBC: 3.9 MIL/uL — AB (ref 4.22–5.81)
RDW: 13.1 % (ref 11.5–15.5)
WBC: 15.1 10*3/uL — ABNORMAL HIGH (ref 4.0–10.5)

## 2017-08-08 LAB — GLUCOSE, CAPILLARY
GLUCOSE-CAPILLARY: 217 mg/dL — AB (ref 65–99)
GLUCOSE-CAPILLARY: 243 mg/dL — AB (ref 65–99)
Glucose-Capillary: 221 mg/dL — ABNORMAL HIGH (ref 65–99)
Glucose-Capillary: 226 mg/dL — ABNORMAL HIGH (ref 65–99)

## 2017-08-08 MED ORDER — ORAL CARE MOUTH RINSE
15.0000 mL | Freq: Two times a day (BID) | OROMUCOSAL | Status: DC
  Administered 2017-08-08 – 2017-08-13 (×9): 15 mL via OROMUCOSAL

## 2017-08-08 MED ORDER — GLIPIZIDE 5 MG PO TABS
5.0000 mg | ORAL_TABLET | Freq: Every day | ORAL | Status: DC
Start: 2017-08-08 — End: 2017-08-09
  Administered 2017-08-09: 5 mg via ORAL
  Filled 2017-08-08: qty 1

## 2017-08-08 MED ORDER — CHLORHEXIDINE GLUCONATE 0.12 % MT SOLN
15.0000 mL | Freq: Two times a day (BID) | OROMUCOSAL | Status: DC
  Administered 2017-08-08 – 2017-08-14 (×12): 15 mL via OROMUCOSAL
  Filled 2017-08-08 (×11): qty 15

## 2017-08-08 NOTE — Evaluation (Signed)
Physical Therapy Evaluation Patient Details Name: Tyrone Cunningham MRN: [id number] DOB: 05-Dec-1955 Today's Date: 08/08/2017   History of Present Illness  Pt. is a 62 y.o. M with significant PMH of severe OSA requiring trach, HTN, HLD, DM, COPD, and CHF presenting with leg pain. Admitted with LLE cellulitis.    Clinical Impression  Pt admitted with above diagnosis. Pt currently with functional limitations due to the deficits listed below (see PT Problem List). Patient currently presenting very close to baseline. Prior to admission, is non ambulatory and transfers independently to/from his wheelchair for mobility. Requiring supervision for transfers today. Limited by LLE pain and noted decreased weightbearing. Pt will benefit from skilled PT to increase their independence and safety with mobility to allow discharge to the venue listed below.      Follow Up Recommendations Home health PT;Supervision for mobility/OOB    Equipment Recommendations  Wheelchair (measurements PT);Wheelchair cushion (measurements PT)    Recommendations for Other Services       Precautions / Restrictions Precautions Precautions: Fall Restrictions Weight Bearing Restrictions: Yes LLE Weight Bearing: Weight bearing as tolerated      Mobility  Bed Mobility               General bed mobility comments: sitting EOB  Transfers Overall transfer level: Needs assistance Equipment used: None Transfers: Sit to/from Stand Sit to Stand: Supervision         General transfer comment: supervision for sit to stand transfer. had previously transferred from bed <> chair with RN with supervision. decreased weight shifting to left   Ambulation/Gait                Stairs            Wheelchair Mobility    Modified Rankin (Stroke Patients Only)       Balance Overall balance assessment: Needs assistance Sitting-balance support: No upper extremity supported;Feet supported Sitting balance-Leahy Scale:  Good       Standing balance-Leahy Scale: Fair                               Pertinent Vitals/Pain Pain Assessment: Faces Faces Pain Scale: Hurts little more Pain Location: LLE with movement Pain Descriptors / Indicators: Grimacing;Discomfort Pain Intervention(s): Monitored during session    Home Living Family/patient expects to be discharged to:: Other (Comment)(long stay hotel) Living Arrangements: Non-relatives/Friends("roommate") Available Help at Discharge: Friend(s) Type of Home: Other(Comment)(hotel) Home Access: Level entry     Home Layout: One level Home Equipment: Emergency planning/management officer - 2 wheels;Wheelchair - Engineer, technical sales - power Additional Comments: states both his manual and power wheelchair are broken    Prior Function Level of Independence: Independent with assistive device(s)         Comments: uses wheelchair for mobiilty; independent with w/c transfers and ADL's     Hand Dominance        Extremity/Trunk Assessment   Upper Extremity Assessment Upper Extremity Assessment: Overall WFL for tasks assessed    Lower Extremity Assessment Lower Extremity Assessment: RLE deficits/detail;LLE deficits/detail RLE Deficits / Details: MMT: hip flexion 4/5, knee extension 4/5, ankle dorsiflexion 2/5. Dark discoloration distally and cool to touch LLE Deficits / Details: unable to fully assess secondary to pain. Grossly 2/5       Communication   Communication: No difficulties  Cognition Arousal/Alertness: Awake/alert Behavior During Therapy: WFL for tasks assessed/performed Overall Cognitive Status: No family/caregiver present to determine baseline cognitive functioning  General Comments: patient requiring frequent redirection to task; very tangential      General Comments General comments (skin integrity, edema, etc.): ace wrap donned LLE    Exercises     Assessment/Plan    PT Assessment  Patient needs continued PT services  PT Problem List Decreased strength;Decreased range of motion;Decreased activity tolerance;Decreased balance;Decreased mobility;Decreased cognition;Obesity;Pain       PT Treatment Interventions DME instruction;Functional mobility training;Therapeutic activities;Therapeutic exercise;Balance training;Neuromuscular re-education;Patient/family education    PT Goals (Current goals can be found in the Care Plan section)  Acute Rehab PT Goals Patient Stated Goal: have a wheelchair that isn't broken PT Goal Formulation: With patient Time For Goal Achievement: 08/22/17 Potential to Achieve Goals: Fair    Frequency Min 3X/week   Barriers to discharge        Co-evaluation               AM-PAC PT "6 Clicks" Daily Activity  Outcome Measure Difficulty turning over in bed (including adjusting bedclothes, sheets and blankets)?: None Difficulty moving from lying on back to sitting on the side of the bed? : A Little Difficulty sitting down on and standing up from a chair with arms (e.g., wheelchair, bedside commode, etc,.)?: A Little Help needed moving to and from a bed to chair (including a wheelchair)?: A Little Help needed walking in hospital room?: A Lot Help needed climbing 3-5 steps with a railing? : Total 6 Click Score: 16    End of Session Equipment Utilized During Treatment: Gait belt;Oxygen Activity Tolerance: Patient tolerated treatment well Patient left: Other (comment);with bed alarm set;with call bell/phone within reach(seated EOB) Nurse Communication: Mobility status PT Visit Diagnosis: Unsteadiness on feet (R26.81);Pain;Other abnormalities of gait and mobility (R26.89) Pain - Right/Left: Left Pain - part of body: Leg    Time: 1209-1233 PT Time Calculation (min) (ACUTE ONLY): 24 min   Charges:   PT Evaluation $PT Eval Moderate Complexity: 1 Mod PT Treatments $Therapeutic Activity: 8-22 mins   PT G Codes:        Laurina Bustlearoline  Tahirah Sara, PT, DPT Acute Rehabilitation Services  Pager: [phone]   Vanetta MuldersCarloine H Caliegh Middlekauff 08/08/2017, 1:05 PM

## 2017-08-08 NOTE — Progress Notes (Signed)
PROGRESS NOTE                                                                                                                                                                                                             Patient Demographics:    Tyrone Cunningham, is a 62 y.o. male, DOB - 11/25/1955, BJY:[id number]  Admit date - 08/04/2017   Admitting Physician Jonah BlueJennifer Yates, MD  Outpatient Primary MD for the patient is Weber, Sharene SkeansSarah L, PA-C  LOS - 4   Chief Complaint  Patient presents with  . Leg Pain       Brief Narrative   62 y.o. male with medical history significant of severe OSA requiring trach; HTN; HLD; DM; COPD; and CHF presenting with leg pain.  About a week ago, he noticed LLE symptoms and things got progressively worse, he was admitted for left lower extremity cellulitis   Subjective:    Tyrone BaileyMark Denard today has, No headache, No chest pain, No abdominal pain - No Nausea, heart breathing at baseline.   a Principal Problem:   Cellulitis and abscess of left leg Active Problems:   Hypertension   Hypercholesteremia   Tracheostomy dependent (HCC)   DM (diabetes mellitus), type 2 with neurological complications (HCC)   Obstructive sleep apnea   Diabetic foot ulcer (HCC)   Acute renal failure (ARF) (HCC)    Left lower extremity cellulitis -Patient presents with low-grade temperature, extensive erythema, edema, and weeping of left lower extremity, has leukocytosis, venous Doppler with no evidence of DVT, appears to be improving, continue with IV Rocephin for now, wound care consult appreciated, continue with leg elevation .and local wound care .  Acute on chronic diastolic CHF -2D echo in 2017 showing EF of 65% with grade 1 diastolic dysfunction, patient on IV fluids for last couple days for renal failure, he has worsening lower extremity edema, continue with IV Lasix 40 mg IV twice daily as long renal function is stable.   Acute renal failure:    - Baseline creatinine is 0.7.  Was 2 on admission, some improvement with hydration initially, but now patient is volume overloaded, was resumed back on Lasix, continue to monitor renal function . -Avoid ACEI and NSAIDs  Diabetic foot ulcer -There is a foot ulcer present that may have served as an entry for infection, does not appear  to be infected, superficial, not deep, x-ray with no evidence of osteomyelitis , continue with wound care  DM -He stopped taking his medications about a year ago, as he thinks it is unhelpful, his A1c March 2019 more than 8, but now we will continue with insulin sliding scale, TGs uncontrolled, and with creatinine remains elevated, I will start glipizide instead of metformin . -Diabetic consult appreciated  Hypokalemia -Repleted  HLD -Continue Lipitor -LDL control in 3/19 - 99  HTN -Started low-dose metoprolol, blood pressure remains increased, I will increase metoprolol to 50 mg twice daily , ideally I would like to start on ACE or ARB if renal function normalize .  OSA -Has trach -Has home O2  Trach dependent respiratory failure -Supplemental O2 via trach collar as needed, patient tells me sometimes he is at 10 L at home ATC, so it is not far off from his baseline, will continue with IV Lasix    Code Status : Full code  Family Communication  : None at bedside  Disposition Plan  : Home once stable  Consults  : None  Procedures  : None  DVT Prophylaxis  : Lovenox  Lab Results  Component Value Date   PLT 312 08/08/2017    Antibiotics  :  Anti-infectives (From admission, onward)   Start     Dose/Rate Route Frequency Ordered Stop   08/04/17 1830  cefTRIAXone (ROCEPHIN) 2 g in sodium chloride 0.9 % 100 mL IVPB     2 g 200 mL/hr over 30 Minutes Intravenous Every 24 hours 08/04/17 1726     08/04/17 1200  clindamycin (CLEOCIN) IVPB 600 mg     600 mg 100 mL/hr over 30 Minutes Intravenous  Once 08/04/17 1159 08/04/17 1438         Objective:   Vitals:   08/08/17 0532 08/08/17 0534 08/08/17 0756 08/08/17 0908  BP: (!) 180/162 (!) 160/70 140/83   Pulse: 86  97 92  Resp: 16  18 18   Temp: 98.4 F (36.9 C)  99.1 F (37.3 C)   TempSrc:   Oral   SpO2: 100%  99%   Weight: 117.8 kg (259 lb 11.2 oz)     Height:        Wt Readings from Last 3 Encounters:  08/08/17 117.8 kg (259 lb 11.2 oz)  07/23/17 117.2 kg (258 lb 6.4 oz)  05/26/17 115.2 kg (254 lb)     Intake/Output Summary (Last 24 hours) at 08/08/2017 1334 Last data filed at 08/08/2017 1000 Gross per 24 hour  Intake 940 ml  Output 2350 ml  Net -1410 ml     Physical Exam   Awake Alert, Oriented X 3, No new F.N deficits, Normal affect Trach +, no secretions Good air entry bilaterally, no wheezing crackles,  Regular rate and rhythms, no rubs murmurs gallops  +ve B.Sounds, Abd Soft, No tenderness,- guarding or rigidity. No Cyanosis, Clubbing, he started to develop lower extremity edema,left lower extremity is currently wrapped with Kerlix     Data Review:    CBC Recent Labs  Lab 08/04/17 1059 08/05/17 0609 08/06/17 0708 08/07/17 0644 08/08/17 0831  WBC 17.7* 16.7* 15.1* 16.8* 15.1*  HGB 12.0* 11.6* 12.2* 11.5* 11.2*  HCT 36.1* 35.3* 37.8* 35.2* 34.7*  PLT 266 304 332 312 312  MCV 87.2 88.5 88.7 88.2 89.0  MCH 29.0 29.1 28.6 28.8 28.7  MCHC 33.2 32.9 32.3 32.7 32.3  RDW 13.3 13.2 13.1 13.0 13.1  LYMPHSABS 1.4  --   --   --   --  MONOABS 1.1*  --   --   --   --   EOSABS 0.0  --   --   --   --   BASOSABS 0.0  --   --   --   --     Chemistries  Recent Labs  Lab 08/04/17 1059 08/05/17 0609 08/06/17 0708 08/07/17 0644 08/08/17 0831  NA 138 139 141 139 137  K 3.2* 2.9* 4.6 3.8 3.8  CL 101 102 100* 101 97*  CO2 29 29 29  32 30  GLUCOSE 237* 120* 163* 139* 224*  BUN 28* 22* 21* 22* 21*  CREATININE 2.00* 1.67* 1.72* 1.76* 1.66*  CALCIUM 8.0* 8.0* 8.5* 8.1* 8.2*  MG  --   --  1.5*  --   --   AST 20  --   --   --   --   ALT 35  --    --   --   --   ALKPHOS 291*  --   --   --   --   BILITOT 0.6  --   --   --   --    ------------------------------------------------------------------------------------------------------------------ No results for input(s): CHOL, HDL, LDLCALC, TRIG, CHOLHDL, LDLDIRECT in the last 72 hours.  Lab Results  Component Value Date   HGBA1C 8.6 (H) 05/26/2017   ------------------------------------------------------------------------------------------------------------------ No results for input(s): TSH, T4TOTAL, T3FREE, THYROIDAB in the last 72 hours.  Invalid input(s): FREET3 ------------------------------------------------------------------------------------------------------------------ No results for input(s): VITAMINB12, FOLATE, FERRITIN, TIBC, IRON, RETICCTPCT in the last 72 hours.  Coagulation profile No results for input(s): INR, PROTIME in the last 168 hours.  No results for input(s): DDIMER in the last 72 hours.  Cardiac Enzymes No results for input(s): CKMB, TROPONINI, MYOGLOBIN in the last 168 hours.  Invalid input(s): CK ------------------------------------------------------------------------------------------------------------------    Component Value Date/Time   BNP 40.4 04/29/2017 0930   BNP 136.0 (H) 03/21/2015 1131    Inpatient Medications  Scheduled Meds: . chlorhexidine  15 mL Mouth Rinse BID  . docusate sodium  100 mg Oral BID  . enoxaparin (LOVENOX) injection  40 mg Subcutaneous Q24H  . furosemide  40 mg Intravenous BID  . insulin aspart  0-15 Units Subcutaneous TID WC  . insulin aspart  0-5 Units Subcutaneous QHS  . mouth rinse  15 mL Mouth Rinse q12n4p  . metoprolol tartrate  50 mg Oral BID   Continuous Infusions: . cefTRIAXone (ROCEPHIN)  IV Stopped (08/07/17 1839)   PRN Meds:.acetaminophen **OR** acetaminophen, albuterol, ondansetron **OR** ondansetron (ZOFRAN) IV, oxyCODONE  Micro Results No results found for this or any previous visit (from the  past 240 hour(s)).  Radiology Reports Dg Tibia/fibula Left  Result Date: 08/04/2017 CLINICAL DATA:  Left leg pain, swelling and weeping. Area on anterior lower left leg and dorsal left foot that are open wounds with yellow-ish pus. Hx diabetes EXAM: LEFT TIBIA AND FIBULA - 2 VIEW COMPARISON:  None. FINDINGS: AP and lateral views of the tibia and fibula are provided. Osseous alignment is normal. Bone mineralization is normal. No acute or suspicious osseous lesion. Adjacent soft tissues are unremarkable. No soft tissue gas seen. IMPRESSION: Negative plain film examination of the LEFT tibia and fibula. No evidence of osteomyelitis. No soft tissue gas seen. Electronically Signed   By: Bary Richard M.D.   On: 08/04/2017 13:46   Dg Chest Port 1 View  Result Date: 08/07/2017 CLINICAL DATA:  Shortness of breath EXAM: PORTABLE CHEST 1 VIEW COMPARISON:  04/29/2017 FINDINGS: Tracheostomy is unchanged. Mild cardiomegaly.  No confluent airspace opacities or effusions. No acute bony abnormality. IMPRESSION: Borderline heart size.  No active disease. Electronically Signed   By: Charlett Nose M.D.   On: 08/07/2017 09:13   Dg Toe Great Left  Result Date: 08/04/2017 CLINICAL DATA:  Great toe ulcer. EXAM: LEFT GREAT TOE COMPARISON:  None. FINDINGS: No acute fracture or dislocation. Moderate first MTP joint space narrowing with marginal osteophyte formation. No osteolysis or cortical destruction. Vascular calcifications. IMPRESSION: 1. No radiographic evidence of osteomyelitis. 2. Moderate first MTP joint osteoarthritis. Electronically Signed   By: Obie Dredge M.D.   On: 08/04/2017 16:37      Huey Bienenstock M.D on 08/08/2017 at 1:34 PM  Between 7am to 7pm - Pager - [phone]  After 7pm go to [email or web address] - password [facility]  [facility] -  Office  [phone]

## 2017-08-09 LAB — CBC
HCT: 35.3 % — ABNORMAL LOW (ref 39.0–52.0)
HEMOGLOBIN: 11.5 g/dL — AB (ref 13.0–17.0)
MCH: 28.8 pg (ref 26.0–34.0)
MCHC: 32.6 g/dL (ref 30.0–36.0)
MCV: 88.3 fL (ref 78.0–100.0)
PLATELETS: 315 10*3/uL (ref 150–400)
RBC: 4 MIL/uL — AB (ref 4.22–5.81)
RDW: 12.8 % (ref 11.5–15.5)
WBC: 15.5 10*3/uL — AB (ref 4.0–10.5)

## 2017-08-09 LAB — BASIC METABOLIC PANEL
ANION GAP: 10 (ref 5–15)
BUN: 27 mg/dL — ABNORMAL HIGH (ref 6–20)
CHLORIDE: 95 mmol/L — AB (ref 101–111)
CO2: 33 mmol/L — ABNORMAL HIGH (ref 22–32)
Calcium: 8.5 mg/dL — ABNORMAL LOW (ref 8.9–10.3)
Creatinine, Ser: 1.69 mg/dL — ABNORMAL HIGH (ref 0.61–1.24)
GFR, EST AFRICAN AMERICAN: 48 mL/min — AB (ref >60)
GFR, EST NON AFRICAN AMERICAN: 41 mL/min — AB (ref >60)
Glucose, Bld: 210 mg/dL — ABNORMAL HIGH (ref 65–99)
POTASSIUM: 4.1 mmol/L (ref 3.5–5.1)
SODIUM: 138 mmol/L (ref 135–145)

## 2017-08-09 LAB — GLUCOSE, CAPILLARY
GLUCOSE-CAPILLARY: 184 mg/dL — AB (ref 65–99)
GLUCOSE-CAPILLARY: 185 mg/dL — AB (ref 65–99)
GLUCOSE-CAPILLARY: 177 mg/dL — AB (ref 65–99)
Glucose-Capillary: 240 mg/dL — ABNORMAL HIGH (ref 65–99)

## 2017-08-09 MED ORDER — GLIPIZIDE 5 MG PO TABS: 10.0000 mg | ORAL_TABLET | Freq: Every day | ORAL | Status: DC

## 2017-08-09 MED ORDER — FUROSEMIDE 10 MG/ML IJ SOLN
60.0000 mg | Freq: Two times a day (BID) | INTRAMUSCULAR | Status: DC
  Administered 2017-08-09 – 2017-08-12 (×7): 60 mg via INTRAVENOUS
  Filled 2017-08-09 (×7): qty 6

## 2017-08-09 MED ORDER — GLIPIZIDE 5 MG PO TABS
5.0000 mg | ORAL_TABLET | Freq: Every day | ORAL | Status: DC
  Administered 2017-08-10 – 2017-08-14 (×5): 5 mg via ORAL
  Filled 2017-08-09 (×5): qty 1

## 2017-08-09 MED ORDER — METOPROLOL TARTRATE 50 MG PO TABS
75.0000 mg | ORAL_TABLET | Freq: Two times a day (BID) | ORAL | Status: DC
  Administered 2017-08-09 – 2017-08-14 (×10): 75 mg via ORAL
  Filled 2017-08-09 (×11): qty 1

## 2017-08-09 NOTE — Progress Notes (Signed)
PROGRESS NOTE                                                                                                                                                                                                             Patient Demographics:    Tyrone Cunningham, is a 62 y.o. male, DOB - 04/30/1955, ZOX:[id number]  Admit date - 08/04/2017   Admitting Physician Tyrone BlueJennifer Yates, MD  Outpatient Primary MD for the patient is Weber, Sharene SkeansSarah L, PA-C  LOS - 5   Chief Complaint  Patient presents with  . Leg Pain       Brief Narrative   62 y.o. male with medical history significant of severe OSA requiring trach; HTN; HLD; DM; COPD; and CHF presenting with leg pain.  About a week ago, he noticed LLE symptoms and things got progressively worse, he was admitted for left lower extremity cellulitis   Subjective:    Tyrone Cunningham today has, No headache, No chest pain, No abdominal pain - No Nausea, heart breathing at baseline.   a Principal Problem:   Cellulitis and abscess of left leg Active Problems:   Hypertension   Hypercholesteremia   Tracheostomy dependent (HCC)   DM (diabetes mellitus), type 2 with neurological complications (HCC)   Obstructive sleep apnea   Diabetic foot ulcer (HCC)   Acute renal failure (ARF) (HCC)    Left lower extremity cellulitis -Patient presents with low-grade temperature, extensive erythema, edema, and weeping of left lower extremity, has leukocytosis, venous Doppler with no evidence of DVT, appears to be improving, continue with IV Rocephin for now, wound care consult appreciated, continue with leg elevation .and local wound care .  Acute on chronic diastolic CHF -2D echo in 2017 showing EF of 65% with grade 1 diastolic dysfunction, IV fluid initially secondary to renal failure, and has diuresis was held, appears to be in some volume overload, I have started on Lasix 40 mg IV twice daily, actually weight went up today from 117  2018 kg, will increase Lasix to 60 mg IV twice daily .  Venous stasis ulcers -cotnue with local wound care, likely will need referral to wound clinic with Unna boots.  Acute renal failure:  - Baseline creatinine is 0.7.  Was 2 on admission, some improvement with hydration initially, but now patient is volume  overloaded, was resumed back on Lasix, continue to monitor renal function . -Avoid ACEI and NSAIDs  Diabetic foot ulcer -There is a foot ulcer present that may have served as an entry for infection, does not appear to be infected, superficial, not deep, x-ray with no evidence of osteomyelitis , continue with wound care  DM -He stopped taking his medications about a year ago, as he thinks it is unhelpful, his A1c March 2019 more than 8, but now we will continue with insulin sliding scale, TGs uncontrolled, and with creatinine remains elevated, started glipizide instead of metformin . -Diabetic consult appreciated  Hypokalemia -Repleted  HLD -Continue Lipitor -LDL control in 3/19 - 99  HTN -Started low-dose metoprolol, overall blood pressure has improved, but remains elevated, so I will go ahead and increase his metoprolol to 75 mg twice daily, ideally I would like to start on ACE or ARB if renal function normalize .  OSA -Has trach -Has home O2  Trach dependent respiratory failure -Supplemental O2 via trach collar as needed, patient tells me sometimes he is at 10 Cunningham at home ATC, so it is not far off from his baseline, will continue with IV Lasix    Code Status : Full code  Family Communication  : None at bedside  Disposition Plan  : Home once stable  Consults  : None  Procedures  : None  DVT Prophylaxis  : Lovenox  Lab Results  Component Value Date   PLT 315 08/09/2017    Antibiotics  :  Anti-infectives (From admission, onward)   Start     Dose/Rate Route Frequency Ordered Stop   08/04/17 1830  cefTRIAXone (ROCEPHIN) 2 g in sodium chloride 0.9 % 100 mL  IVPB     2 g 200 mL/hr over 30 Minutes Intravenous Every 24 hours 08/04/17 1726     08/04/17 1200  clindamycin (CLEOCIN) IVPB 600 mg     600 mg 100 mL/hr over 30 Minutes Intravenous  Once 08/04/17 1159 08/04/17 1438        Objective:   Vitals:   08/09/17 0841 08/09/17 1203 08/09/17 1526 08/09/17 1532  BP:   (!) 147/81   Pulse: 77 81 81 92  Resp: 18 18 20 18   Temp:   99.5 F (37.5 C)   TempSrc:   Oral   SpO2: 98% 100% 100% 96%  Weight:      Height:        Wt Readings from Last 3 Encounters:  08/09/17 118.3 kg (260 lb 12.9 oz)  07/23/17 117.2 kg (258 lb 6.4 oz)  05/26/17 115.2 kg (254 lb)     Intake/Output Summary (Last 24 hours) at 08/09/2017 1534 Last data filed at 08/09/2017 1518 Gross per 24 hour  Intake 480 ml  Output 2350 ml  Net -1870 ml     Physical Exam   AAOX3, NAD Trach +, no secretions CTA, no wheezing, no use of accessory muscles Regular rate and rhythm, no rubs murmurs gallops +ve B.Sounds, Abd Soft, No tenderness,- guarding or rigidity. No Cyanosis, Clubbing, left lower extremity cellulitis has significantly improved, but he continues with significant venous stasis ulcers     Data Review:    CBC Recent Labs  Lab 08/04/17 1059 08/05/17 0609 08/06/17 0708 08/07/17 0644 08/08/17 0831 08/09/17 0504  WBC 17.7* 16.7* 15.1* 16.8* 15.1* 15.5*  HGB 12.0* 11.6* 12.2* 11.5* 11.2* 11.5*  HCT 36.1* 35.3* 37.8* 35.2* 34.7* 35.3*  PLT 266 304 332 312 312 315  MCV 87.2  88.5 88.7 88.2 89.0 88.3  MCH 29.0 29.1 28.6 28.8 28.7 28.8  MCHC 33.2 32.9 32.3 32.7 32.3 32.6  RDW 13.3 13.2 13.1 13.0 13.1 12.8  LYMPHSABS 1.4  --   --   --   --   --   MONOABS 1.1*  --   --   --   --   --   EOSABS 0.0  --   --   --   --   --   BASOSABS 0.0  --   --   --   --   --     Chemistries  Recent Labs  Lab 08/04/17 1059 08/05/17 0609 08/06/17 0708 08/07/17 0644 08/08/17 0831 08/09/17 0504  NA 138 139 141 139 137 138  K 3.2* 2.9* 4.6 3.8 3.8 4.1  CL 101 102  100* 101 97* 95*  CO2 29 29 29  32 30 33*  GLUCOSE 237* 120* 163* 139* 224* 210*  BUN 28* 22* 21* 22* 21* 27*  CREATININE 2.00* 1.67* 1.72* 1.76* 1.66* 1.69*  CALCIUM 8.0* 8.0* 8.5* 8.1* 8.2* 8.5*  MG  --   --  1.5*  --   --   --   AST 20  --   --   --   --   --   ALT 35  --   --   --   --   --   ALKPHOS 291*  --   --   --   --   --   BILITOT 0.6  --   --   --   --   --    ------------------------------------------------------------------------------------------------------------------ No results for input(s): CHOL, HDL, LDLCALC, TRIG, CHOLHDL, LDLDIRECT in the last 72 hours.  Lab Results  Component Value Date   HGBA1C 8.6 (H) 05/26/2017   ------------------------------------------------------------------------------------------------------------------ No results for input(s): TSH, T4TOTAL, T3FREE, THYROIDAB in the last 72 hours.  Invalid input(s): FREET3 ------------------------------------------------------------------------------------------------------------------ No results for input(s): VITAMINB12, FOLATE, FERRITIN, TIBC, IRON, RETICCTPCT in the last 72 hours.  Coagulation profile No results for input(s): INR, PROTIME in the last 168 hours.  No results for input(s): DDIMER in the last 72 hours.  Cardiac Enzymes No results for input(s): CKMB, TROPONINI, MYOGLOBIN in the last 168 hours.  Invalid input(s): CK ------------------------------------------------------------------------------------------------------------------    Component Value Date/Time   BNP 40.4 04/29/2017 0930   BNP 136.0 (H) 03/21/2015 1131    Inpatient Medications  Scheduled Meds: . chlorhexidine  15 mL Mouth Rinse BID  . docusate sodium  100 mg Oral BID  . enoxaparin (LOVENOX) injection  40 mg Subcutaneous Q24H  . furosemide  60 mg Intravenous BID  . [START ON 08/10/2017] glipiZIDE  5 mg Oral QAC breakfast  . insulin aspart  0-15 Units Subcutaneous TID WC  . insulin aspart  0-5 Units  Subcutaneous QHS  . mouth rinse  15 mL Mouth Rinse q12n4p  . metoprolol tartrate  50 mg Oral BID   Continuous Infusions: . cefTRIAXone (ROCEPHIN)  IV Stopped (08/08/17 1848)   PRN Meds:.acetaminophen **OR** acetaminophen, albuterol, ondansetron **OR** ondansetron (ZOFRAN) IV, oxyCODONE  Micro Results No results found for this or any previous visit (from the past 240 hour(s)).  Radiology Reports Dg Tibia/fibula Left  Result Date: 08/04/2017 CLINICAL DATA:  Left leg pain, swelling and weeping. Area on anterior lower left leg and dorsal left foot that are open wounds with yellow-ish pus. Hx diabetes EXAM: LEFT TIBIA AND FIBULA - 2 VIEW COMPARISON:  None. FINDINGS: AP and lateral views of the  tibia and fibula are provided. Osseous alignment is normal. Bone mineralization is normal. No acute or suspicious osseous lesion. Adjacent soft tissues are unremarkable. No soft tissue gas seen. IMPRESSION: Negative plain film examination of the LEFT tibia and fibula. No evidence of osteomyelitis. No soft tissue gas seen. Electronically Signed   By: Bary Richard M.D.   On: 08/04/2017 13:46   Dg Chest Port 1 View  Result Date: 08/07/2017 CLINICAL DATA:  Shortness of breath EXAM: PORTABLE CHEST 1 VIEW COMPARISON:  04/29/2017 FINDINGS: Tracheostomy is unchanged. Mild cardiomegaly. No confluent airspace opacities or effusions. No acute bony abnormality. IMPRESSION: Borderline heart size.  No active disease. Electronically Signed   By: Charlett Nose M.D.   On: 08/07/2017 09:13   Dg Toe Great Left  Result Date: 08/04/2017 CLINICAL DATA:  Great toe ulcer. EXAM: LEFT GREAT TOE COMPARISON:  None. FINDINGS: No acute fracture or dislocation. Moderate first MTP joint space narrowing with marginal osteophyte formation. No osteolysis or cortical destruction. Vascular calcifications. IMPRESSION: 1. No radiographic evidence of osteomyelitis. 2. Moderate first MTP joint osteoarthritis. Electronically Signed   By: Obie Dredge  M.D.   On: 08/04/2017 16:37      Huey Bienenstock M.D on 08/09/2017 at 3:34 PM  Between 7am to 7pm - Pager - [phone]  After 7pm go to [email or web address] - password [facility]  [facility] -  Office  [phone]

## 2017-08-10 LAB — BASIC METABOLIC PANEL
ANION GAP: 11 (ref 5–15)
BUN: 30 mg/dL — ABNORMAL HIGH (ref 6–20)
CALCIUM: 8.3 mg/dL — AB (ref 8.9–10.3)
CO2: 30 mmol/L (ref 22–32)
Chloride: 97 mmol/L — ABNORMAL LOW (ref 101–111)
Creatinine, Ser: 1.61 mg/dL — ABNORMAL HIGH (ref 0.61–1.24)
GFR calc Af Amer: 51 mL/min — ABNORMAL LOW (ref >60)
GFR, EST NON AFRICAN AMERICAN: 44 mL/min — AB (ref >60)
Glucose, Bld: 228 mg/dL — ABNORMAL HIGH (ref 65–99)
POTASSIUM: 3.5 mmol/L (ref 3.5–5.1)
SODIUM: 138 mmol/L (ref 135–145)

## 2017-08-10 LAB — CBC
HCT: 35.2 % — ABNORMAL LOW (ref 39.0–52.0)
Hemoglobin: 11.5 g/dL — ABNORMAL LOW (ref 13.0–17.0)
MCH: 28.6 pg (ref 26.0–34.0)
MCHC: 32.7 g/dL (ref 30.0–36.0)
MCV: 87.6 fL (ref 78.0–100.0)
PLATELETS: 326 10*3/uL (ref 150–400)
RBC: 4.02 MIL/uL — AB (ref 4.22–5.81)
RDW: 12.9 % (ref 11.5–15.5)
WBC: 13.9 10*3/uL — AB (ref 4.0–10.5)

## 2017-08-10 LAB — GLUCOSE, CAPILLARY
GLUCOSE-CAPILLARY: 218 mg/dL — AB (ref 65–99)
GLUCOSE-CAPILLARY: 201 mg/dL — AB (ref 65–99)
Glucose-Capillary: 156 mg/dL — ABNORMAL HIGH (ref 65–99)
Glucose-Capillary: 164 mg/dL — ABNORMAL HIGH (ref 65–99)

## 2017-08-10 MED ORDER — POTASSIUM CHLORIDE CRYS ER 20 MEQ PO TBCR
20.0000 meq | EXTENDED_RELEASE_TABLET | Freq: Once | ORAL | Status: AC
  Administered 2017-08-10: 20 meq via ORAL
  Filled 2017-08-10: qty 1

## 2017-08-10 MED ORDER — CLINDAMYCIN PHOSPHATE 600 MG/50ML IV SOLN
600.0000 mg | Freq: Three times a day (TID) | INTRAVENOUS | Status: DC
  Administered 2017-08-10 – 2017-08-14 (×12): 600 mg via INTRAVENOUS
  Filled 2017-08-10 (×12): qty 50

## 2017-08-10 NOTE — Progress Notes (Signed)
PT Cancellation Note  Patient Details Name: Tyrone BaileyMark Cunningham MRN: [id number] DOB: 09/24/1955   Cancelled Treatment:    Reason Eval/Treat Not Completed: Patient declined, no reason specified. Pt declining transfer to chair and ther ex in bed. He states he does not exercise at home and did not come to the hospital to do so. Pt educated on benefits of ther ex for recovery. Pt continues to decline and proceeded to perseverate on his need for a power chair. Will check back as time allows.   Kallie LocksHannah Shaniqwa Horsman, PTA Pager [phone] Acute Rehab   Sheral ApleyHannah E Dana Dorner 08/10/2017, 2:51 PM

## 2017-08-10 NOTE — Consult Note (Signed)
WOC Nurse wound follow up Wound type:Reconsult for cellulitis to left lower leg.  Orders implemented on 08/05/17 are still therapeutic.  No changes at this time.  .  Erythema has improved, left leg with edematous and noted chronic skin changes LEft great toe:  2 cm x 1 cm callous with nonintact center Left anterior foot:  2 cm x 0.5 cm  Wound bed: ruddy red Drainage (amount, consistency, odor) minimal serosanguinous Periwound: Dressing procedure/placement/frequency:COntinue Xerofrom, kerlix and ace bandage for modified light compression.  Elevation of heel and offloading with prevalon boot.   Will not follow at this time.  Please re-consult if needed.  Maple HudsonKaren Eduin Friedel RN BSN CWON Pager [phone]

## 2017-08-10 NOTE — Progress Notes (Addendum)
Inpatient Diabetes Program Recommendations  AACE/ADA: New Consensus Statement on Inpatient Glycemic Control (2015)  Target Ranges:  Prepandial:   less than 140 mg/dL      Peak postprandial:   less than 180 mg/dL (1-2 hours)      Critically ill patients:  140 - 180 mg/dL   Results for Tyrone Cunningham, Tyrone Cunningham (MRN [id number]) as of 08/05/2017 19:10  Ref. Range 05/26/2017 15:22  Hemoglobin A1C Latest Ref Range: 4.8 - 5.6 % 8.6 (H)   Results for Tyrone Cunningham, Tyrone Cunningham (MRN [id number]) as of 08/10/2017 11:56  Ref. Range 08/09/2017 08:35 08/09/2017 12:29 08/09/2017 17:04 08/09/2017 22:01 08/10/2017 08:14  Glucose-Capillary Latest Ref Range: 65 - 99 mg/dL 536184 (H) 644185 (H) 034240 (H) 177 (H) 218 (H)   Diabetes history: Type 2 DM  Outpatient Diabetes medications: Januvia 100 mg daily, Metformin 1000 mg bid-Patient has not taken in over a year Current orders for Inpatient glycemic control:  Novolog moderate tid with meals and HS Glipizide 5 mg Daily added 6/9  Inpatient Diabetes Program Recommendations:    Patient is still having a couple of spikes in glucose trend on Glipizide and Novolog Correction. Could possibly increase frequency of correction scale to Q4 hours due to renal function.  Thanks, Christena DeemShannon Tangia Pinard RN, MSN, BC-ADM, [facility] Inpatient Diabetes Coordinator Team Pager [phone] (8a-5p)

## 2017-08-10 NOTE — Progress Notes (Signed)
PROGRESS NOTE    Tyrone BaileyMark Cunningham  EAV:[id number] DOB: 08/15/1955 DOA: 08/04/2017 PCP: Morrell RiddleWeber, Tyrone L, PA-C    Brief Narrative: 62 y.o.malewith medical history significant ofsevere OSA requiring trach; HTN; HLD; DM; COPD; and CHF presenting with leg pain.About a week ago, he noticed LLE symptoms and things got progressively worse, he was admitted for left lower extremity cellulitis   Assessment & Plan:   Principal Problem:   Cellulitis and abscess of left leg Active Problems:   Hypertension   Hypercholesteremia   Tracheostomy dependent (HCC)   DM (diabetes mellitus), type 2 with neurological complications (HCC)   Obstructive sleep apnea   Diabetic foot ulcer (HCC)   Acute renal failure (ARF) (HCC)   1-Left LE Cellulitis; Diabetic foot ulcer Low grade tempeture, LE edema and erythema.  Doppler negative for DVT IV ceftriaxone.  Still with significant redness, not better per patient. IV clindamycin.  X-ray  no evidence of osteomyelitis.   2-Acute on Chronic Diastolic HF;  2D echo in 2017 showing EF of 65% with grade 1 diastolic dysfunction Continue with IV lasix 60 mg IV BID.  Weight 117---115 kg  3-venous stasis ulcer.  Will need to follow wound care consult. Will need unna boot.   4-Acute renal failure;  Baseline creatinine is0.7.  Was 2 on admission, some improvement with hydration initially, but now patient is volume overloaded, was resumed back on Lasix, continue to monitor renal function . Stable on IV lasix.   5-DM;  Stop medications year ago.  Stop metformin due to AKI.  Glipizide.   6-HLD -LDL control in 3/19 - 99   OSA, Trach dependent respiratory failure -Has trach -Has home O2 -Continue with IV lasix.    DVT prophylaxis: Lovenox.  Code Status: Full code.  Family Communication: Care discussed with patient.  Disposition Plan: to be determine.    Consultants:   none   Procedures:   none   Antimicrobials:  Ceftriaxone  6-04  Clindamycin 6-10   Subjective: He report worsening redness and edema leg.  breathing ok.   Objective: Vitals:   08/10/17 0839 08/10/17 1115 08/10/17 1418 08/10/17 1500  BP:    (!) 120/48  Pulse: 76 77 76 73  Resp: 18 20 20 20   Temp:      TempSrc:      SpO2: 100% 99% 100% 99%  Weight:      Height:        Intake/Output Summary (Last 24 hours) at 08/10/2017 1540 Last data filed at 08/10/2017 1508 Gross per 24 hour  Intake 530 ml  Output 875 ml  Net -345 ml   Filed Weights   08/08/17 0532 08/09/17 0240 08/10/17 0500  Weight: 117.8 kg (259 lb 11.2 oz) 118.3 kg (260 lb 12.9 oz) 115.8 kg (255 lb 4.7 oz)    Examination:  General exam: Appears calm and comfortable , morbid obese with trach  Respiratory system: Clear to auscultation. Respiratory effort normal. Cardiovascular system: S1 & S2 heard, RRR. No JVD, murmurs, rubs, gallops or clicks. No pedal edema. Gastrointestinal system: Abdomen is nondistended, soft and nontender. No organomegaly or masses felt. Normal bowel sounds heard. Central nervous system: Alert and oriented. No focal neurological deficits. Extremities: Symmetric 5 x 5 power. Skin: Right lower extremity with redness, multiples small dry wound. Edema.  Psychiatry: Judgement and insight appear normal. Mood & affect appropriate.     Data Reviewed: I have personally reviewed following labs and imaging studies  CBC: Recent Labs  Lab 08/04/17 1059  08/06/17 0708 08/07/17 0960 08/08/17 0831 08/09/17 0504 08/10/17 0319  WBC 17.7*   < > 15.1* 16.8* 15.1* 15.5* 13.9*  NEUTROABS 15.2*  --   --   --   --   --   --   HGB 12.0*   < > 12.2* 11.5* 11.2* 11.5* 11.5*  HCT 36.1*   < > 37.8* 35.2* 34.7* 35.3* 35.2*  MCV 87.2   < > 88.7 88.2 89.0 88.3 87.6  PLT 266   < > 332 312 312 315 326   < > = values in this interval not displayed.   Basic Metabolic Panel: Recent Labs  Lab 08/06/17 0708 08/07/17 0644 08/08/17 0831 08/09/17 0504 08/10/17 0319  NA  141 139 137 138 138  K 4.6 3.8 3.8 4.1 3.5  CL 100* 101 97* 95* 97*  CO2 29 32 30 33* 30  GLUCOSE 163* 139* 224* 210* 228*  BUN 21* 22* 21* 27* 30*  CREATININE 1.72* 1.76* 1.66* 1.69* 1.61*  CALCIUM 8.5* 8.1* 8.2* 8.5* 8.3*  MG 1.5*  --   --   --   --    GFR: Estimated Creatinine Clearance: 56.4 mL/min (A) (by C-G formula based on SCr of 1.61 mg/dL (H)). Liver Function Tests: Recent Labs  Lab 08/04/17 1059  AST 20  ALT 35  ALKPHOS 291*  BILITOT 0.6  PROT 6.4*  ALBUMIN 1.9*   No results for input(s): LIPASE, AMYLASE in the last 168 hours. No results for input(s): AMMONIA in the last 168 hours. Coagulation Profile: No results for input(s): INR, PROTIME in the last 168 hours. Cardiac Enzymes: No results for input(s): CKTOTAL, CKMB, CKMBINDEX, TROPONINI in the last 168 hours. BNP (last 3 results) No results for input(s): PROBNP in the last 8760 hours. HbA1C: No results for input(s): HGBA1C in the last 72 hours. CBG: Recent Labs  Lab 08/09/17 1229 08/09/17 1704 08/09/17 2201 08/10/17 0814 08/10/17 1206  GLUCAP 185* 240* 177* 218* 201*   Lipid Profile: No results for input(s): CHOL, HDL, LDLCALC, TRIG, CHOLHDL, LDLDIRECT in the last 72 hours. Thyroid Function Tests: No results for input(s): TSH, T4TOTAL, FREET4, T3FREE, THYROIDAB in the last 72 hours. Anemia Panel: No results for input(s): VITAMINB12, FOLATE, FERRITIN, TIBC, IRON, RETICCTPCT in the last 72 hours. Sepsis Labs: Recent Labs  Lab 08/04/17 1108  LATICACIDVEN 1.57    No results found for this or any previous visit (from the past 240 hour(s)).       Radiology Studies: No results found.      Scheduled Meds: . chlorhexidine  15 mL Mouth Rinse BID  . docusate sodium  100 mg Oral BID  . enoxaparin (LOVENOX) injection  40 mg Subcutaneous Q24H  . furosemide  60 mg Intravenous BID  . glipiZIDE  5 mg Oral QAC breakfast  . insulin aspart  0-15 Units Subcutaneous TID WC  . insulin aspart  0-5  Units Subcutaneous QHS  . mouth rinse  15 mL Mouth Rinse q12n4p  . metoprolol tartrate  75 mg Oral BID   Continuous Infusions: . cefTRIAXone (ROCEPHIN)  IV Stopped (08/09/17 1917)  . clindamycin (CLEOCIN) IV Stopped (08/10/17 1508)     LOS: 6 days    Time spent: 35 minutes.     Alba Cory, MD [facility] Pager [phone]  If 7PM-7AM, please contact night-coverage [email or web address] Password [email or web address] 08/10/2017, 3:40 PM

## 2017-08-11 ENCOUNTER — Telehealth: Payer: Self-pay

## 2017-08-11 LAB — CBC
HCT: 35.1 % — ABNORMAL LOW (ref 39.0–52.0)
Hemoglobin: 11.6 g/dL — ABNORMAL LOW (ref 13.0–17.0)
MCH: 28.8 pg (ref 26.0–34.0)
MCHC: 33 g/dL (ref 30.0–36.0)
MCV: 87.1 fL (ref 78.0–100.0)
PLATELETS: 331 10*3/uL (ref 150–400)
RBC: 4.03 MIL/uL — ABNORMAL LOW (ref 4.22–5.81)
RDW: 12.7 % (ref 11.5–15.5)
WBC: 12.6 10*3/uL — ABNORMAL HIGH (ref 4.0–10.5)

## 2017-08-11 LAB — BASIC METABOLIC PANEL WITH GFR
Anion gap: 8 (ref 5–15)
BUN: 28 mg/dL — ABNORMAL HIGH (ref 6–20)
CO2: 32 mmol/L (ref 22–32)
Calcium: 8.7 mg/dL — ABNORMAL LOW (ref 8.9–10.3)
Chloride: 97 mmol/L — ABNORMAL LOW (ref 101–111)
Creatinine, Ser: 1.47 mg/dL — ABNORMAL HIGH (ref 0.61–1.24)
GFR calc Af Amer: 56 mL/min — ABNORMAL LOW
GFR calc non Af Amer: 49 mL/min — ABNORMAL LOW
Glucose, Bld: 159 mg/dL — ABNORMAL HIGH (ref 65–99)
Potassium: 3.9 mmol/L (ref 3.5–5.1)
Sodium: 137 mmol/L (ref 135–145)

## 2017-08-11 LAB — GLUCOSE, CAPILLARY
Glucose-Capillary: 169 mg/dL — ABNORMAL HIGH (ref 65–99)
Glucose-Capillary: 200 mg/dL — ABNORMAL HIGH (ref 65–99)
Glucose-Capillary: 211 mg/dL — ABNORMAL HIGH (ref 65–99)
Glucose-Capillary: 229 mg/dL — ABNORMAL HIGH (ref 65–99)

## 2017-08-11 LAB — MAGNESIUM: Magnesium: 1.5 mg/dL — ABNORMAL LOW (ref 1.7–2.4)

## 2017-08-11 NOTE — Telephone Encounter (Signed)
Patient was informed  He has an appointment for a follow up on 6/14

## 2017-08-11 NOTE — Progress Notes (Signed)
Writer was present with Comptrollerstudent nurse Edmonia Caprio(JM) for all medication administrations and I concur with assessment of student nurse from 7am-5pm. Communication with staff nurse maintained and nurse aware of medications given up to 5pm and is aware of medications needing to be given at 1800. Patient is alert and oriented and is currently sitting up in bed with no complaints.

## 2017-08-11 NOTE — Telephone Encounter (Signed)
Copied from CRM [phone]#[id number]. Topic: Inquiry >> Aug 10, 2017  9:43 AM Stephannie LiSimmons, Janett L, NT wrote: Reason for CRM: Patient called to follow up on request for a powered wheel chair called  [employer]  [employer] , please call him at [phone] with any questions.

## 2017-08-11 NOTE — Progress Notes (Signed)
PROGRESS NOTE    Tyrone BaileyMark Cunningham  ZOX:[id number] DOB: 08/14/1955 DOA: 08/04/2017 PCP: Morrell RiddleWeber, [name], PA-C    Brief Narrative: 62 y.o.malewith medical history significant ofsevere OSA requiring trach; HTN; HLD; DM; COPD; and CHF presenting with leg pain.About a week ago, he noticed LLE symptoms and things got progressively worse, he was admitted for left lower extremity cellulitis  Patient admitted with renal failure, received iV fluids initially for renal failure. Now on IV lasix due to volume overload. He was also admitted with left LE cellulitis, initially on IV ceftriaxone, added clindamycin 6-10 due to no significant improvement.    Assessment & Plan:   Principal Problem:   Cellulitis and abscess of left leg Active Problems:   Hypertension   Hypercholesteremia   Tracheostomy dependent (HCC)   DM (diabetes mellitus), type 2 with neurological complications (HCC)   Obstructive sleep apnea   Diabetic foot ulcer (HCC)   Acute renal failure (ARF) (HCC)   1-Left LE Cellulitis; Diabetic foot ulcer Low grade tempeture, LE edema and erythema.  Doppler negative for DVT IV ceftriaxone, and clindamycin.  X-ray  no evidence of osteomyelitis.  WBC trending down, improving.   2-Acute on Chronic Diastolic HF;  2D echo in 2017 showing EF of 65% with grade 1 diastolic dysfunction Continue with IV lasix 60 mg IV BID.  Weight 117---115 kg--116 Renal function stable.   3-venous stasis ulcer.  Will need to follow wound care consult. Will need unna boot.   4-Acute renal failure;  Baseline creatinine is0.7.  Was 2 on admission, some improvement with hydration initially, but now patient is volume overloaded, was resumed back on Lasix, continue to monitor renal function . Stable on IV lasix.  Cr decreased today to 1.4.    5-DM;  Stop medications year ago.  Stop metformin due to AKI.  Glipizide.   6-HLD -LDL control in 3/19 - 99   OSA, Trach dependent respiratory failure -Has  trach -Has home O2 -Continue with IV lasix.    DVT prophylaxis: Lovenox.  Code Status: Full code.  Family Communication: Care discussed with patient.  Disposition Plan: to be determine.    Consultants:   none   Procedures:   none   Antimicrobials:  Ceftriaxone 6-04  Clindamycin 6-10   Subjective: Redness decreased LE.  Still with significant edema.   Objective: Vitals:   08/11/17 0758 08/11/17 0831 08/11/17 1007 08/11/17 1640  BP: (!) 157/66  138/62 (!) 150/71  Pulse: 74 85 86 70  Resp: 19 20    Temp: 98.7 F (37.1 C)   98.6 F (37 C)  TempSrc: Oral   Oral  SpO2: 100% 96%  98%  Weight:      Height:        Intake/Output Summary (Last 24 hours) at 08/11/2017 1759 Last data filed at 08/11/2017 1532 Gross per 24 hour  Intake 582 ml  Output 3285 ml  Net -2703 ml   Filed Weights   08/09/17 0240 08/10/17 0500 08/11/17 0410  Weight: 118.3 kg (260 lb 12.9 oz) 115.8 kg (255 lb 4.7 oz) 116.8 kg (257 lb 8 oz)    Examination:  General exam: NAD, trach in place Respiratory system: normal respiratory effort, crackles bases.  Cardiovascular system:  S 1, S 2 RRR Gastrointestinal system: BS present, soft, nt Central nervous system: non focal.  Extremities: Symmetric power.  Skin: Right LE with edema, redness, multiples small ulcer.      Data Reviewed: I have personally reviewed following labs and imaging  studies  CBC: Recent Labs  Lab 08/07/17 0644 08/08/17 0831 08/09/17 0504 08/10/17 0319 08/11/17 0623  WBC 16.8* 15.1* 15.5* 13.9* 12.6*  HGB 11.5* 11.2* 11.5* 11.5* 11.6*  HCT 35.2* 34.7* 35.3* 35.2* 35.1*  MCV 88.2 89.0 88.3 87.6 87.1  PLT 312 312 315 326 331   Basic Metabolic Panel: Recent Labs  Lab 08/06/17 0708 08/07/17 0644 08/08/17 0831 08/09/17 0504 08/10/17 0319 08/11/17 0623  NA 141 139 137 138 138 137  K 4.6 3.8 3.8 4.1 3.5 3.9  CL 100* 101 97* 95* 97* 97*  CO2 29 32 30 33* 30 32  GLUCOSE 163* 139* 224* 210* 228* 159*  BUN  21* 22* 21* 27* 30* 28*  CREATININE 1.72* 1.76* 1.66* 1.69* 1.61* 1.47*  CALCIUM 8.5* 8.1* 8.2* 8.5* 8.3* 8.7*  MG 1.5*  --   --   --   --  1.5*   GFR: Estimated Creatinine Clearance: 62 mL/min (A) (by C-G formula based on SCr of 1.47 mg/dL (H)). Liver Function Tests: No results for input(s): AST, ALT, ALKPHOS, BILITOT, PROT, ALBUMIN in the last 168 hours. No results for input(s): LIPASE, AMYLASE in the last 168 hours. No results for input(s): AMMONIA in the last 168 hours. Coagulation Profile: No results for input(s): INR, PROTIME in the last 168 hours. Cardiac Enzymes: No results for input(s): CKTOTAL, CKMB, CKMBINDEX, TROPONINI in the last 168 hours. BNP (last 3 results) No results for input(s): PROBNP in the last 8760 hours. HbA1C: No results for input(s): HGBA1C in the last 72 hours. CBG: Recent Labs  Lab 08/10/17 1626 08/10/17 2107 08/11/17 0756 08/11/17 1153 08/11/17 1647  GLUCAP 156* 164* 169* 200* 211*   Lipid Profile: No results for input(s): CHOL, HDL, LDLCALC, TRIG, CHOLHDL, LDLDIRECT in the last 72 hours. Thyroid Function Tests: No results for input(s): TSH, T4TOTAL, FREET4, T3FREE, THYROIDAB in the last 72 hours. Anemia Panel: No results for input(s): VITAMINB12, FOLATE, FERRITIN, TIBC, IRON, RETICCTPCT in the last 72 hours. Sepsis Labs: No results for input(s): PROCALCITON, LATICACIDVEN in the last 168 hours.  No results found for this or any previous visit (from the past 240 hour(s)).       Radiology Studies: No results found.      Scheduled Meds: . chlorhexidine  15 mL Mouth Rinse BID  . docusate sodium  100 mg Oral BID  . enoxaparin (LOVENOX) injection  40 mg Subcutaneous Q24H  . furosemide  60 mg Intravenous BID  . glipiZIDE  5 mg Oral QAC breakfast  . insulin aspart  0-15 Units Subcutaneous TID WC  . insulin aspart  0-5 Units Subcutaneous QHS  . mouth rinse  15 mL Mouth Rinse q12n4p  . metoprolol tartrate  75 mg Oral BID   Continuous  Infusions: . cefTRIAXone (ROCEPHIN)  IV Stopped (08/10/17 1819)  . clindamycin (CLEOCIN) IV Stopped (08/11/17 1332)     LOS: 7 days    Time spent: 35 minutes.     Alba Cory, MD [facility] Pager [phone]  If 7PM-7AM, please contact night-coverage [email or web address] Password [facility] 08/11/2017, 5:59 PM

## 2017-08-11 NOTE — Progress Notes (Signed)
PT Cancellation Note  Patient Details Name: [name] Gosnell MRN: [id number] DOB: 11/02/1955   Cancelled Treatment:    Reason Eval/Treat Not Completed: Patient declined, no reason specified  Patient declined mobility despite education on benefits of OOB mobility. PT will continue to follow acutely.   Derek MoundKellyn R Matia Zelada Avryl Roehm, PTA Pager: [phone]   08/11/2017, 3:25 PM

## 2017-08-11 NOTE — Progress Notes (Signed)
Dressing to left lower extremity changed per order.

## 2017-08-11 NOTE — Progress Notes (Signed)
Dressing on left lower extremity changed per order.

## 2017-08-12 ENCOUNTER — Telehealth: Payer: Self-pay | Admitting: *Deleted

## 2017-08-12 DIAGNOSIS — G4733 Obstructive sleep apnea (adult) (pediatric): Secondary | ICD-10-CM

## 2017-08-12 LAB — GLUCOSE, CAPILLARY
GLUCOSE-CAPILLARY: 141 mg/dL — AB (ref 65–99)
GLUCOSE-CAPILLARY: 189 mg/dL — AB (ref 65–99)
GLUCOSE-CAPILLARY: 224 mg/dL — AB (ref 65–99)
Glucose-Capillary: 155 mg/dL — ABNORMAL HIGH (ref 65–99)

## 2017-08-12 LAB — BASIC METABOLIC PANEL
Anion gap: 8 (ref 5–15)
BUN: 31 mg/dL — ABNORMAL HIGH (ref 6–20)
CHLORIDE: 97 mmol/L — AB (ref 101–111)
CO2: 31 mmol/L (ref 22–32)
CREATININE: 1.49 mg/dL — AB (ref 0.61–1.24)
Calcium: 8.7 mg/dL — ABNORMAL LOW (ref 8.9–10.3)
GFR, EST AFRICAN AMERICAN: 55 mL/min — AB (ref >60)
GFR, EST NON AFRICAN AMERICAN: 48 mL/min — AB (ref >60)
Glucose, Bld: 189 mg/dL — ABNORMAL HIGH (ref 65–99)
POTASSIUM: 3.9 mmol/L (ref 3.5–5.1)
SODIUM: 136 mmol/L (ref 135–145)

## 2017-08-12 LAB — CBC
HCT: 36.7 % — ABNORMAL LOW (ref 39.0–52.0)
HEMOGLOBIN: 12 g/dL — AB (ref 13.0–17.0)
MCH: 29.1 pg (ref 26.0–34.0)
MCHC: 32.7 g/dL (ref 30.0–36.0)
MCV: 88.9 fL (ref 78.0–100.0)
PLATELETS: 330 10*3/uL (ref 150–400)
RBC: 4.13 MIL/uL — AB (ref 4.22–5.81)
RDW: 12.8 % (ref 11.5–15.5)
WBC: 10.8 10*3/uL — ABNORMAL HIGH (ref 4.0–10.5)

## 2017-08-12 MED ORDER — MAGNESIUM SULFATE 2 GM/50ML IV SOLN
2.0000 g | Freq: Once | INTRAVENOUS | Status: AC
  Administered 2017-08-12: 2 g via INTRAVENOUS
  Filled 2017-08-12: qty 50

## 2017-08-12 NOTE — Telephone Encounter (Signed)
pec transferred patient, patient stated he received a phone call.  Advised patient we did not call Patient states he asked for Raynelle FanningJulie because he spoke with her yesterday..   Patient stated he was going to walk out of hospital for appointment he has scheduled with Maralyn SagoSarah on Friday he was advised not to do so that we can reschedule when he is discharged.  He was advised to speak with his doctor at the hospital.   He voiced understanding and stated he would speak with his doctor.

## 2017-08-12 NOTE — Progress Notes (Signed)
Inpatient Diabetes Program Recommendations  AACE/ADA: New Consensus Statement on Inpatient Glycemic Control (2015)  Target Ranges:  Prepandial:   less than 140 mg/dL      Peak postprandial:   less than 180 mg/dL (1-2 hours)      Critically ill patients:  140 - 180 mg/dL   Lab Results  Component Value Date   GLUCAP 141 (H) 08/12/2017   HGBA1C 8.6 (H) 05/26/2017    Review of Glycemic Control Results for Tyrone Cunningham, Tyrone Cunningham (MRN [id number]) as of 08/12/2017 09:55  Ref. Range 08/11/2017 07:56 08/11/2017 11:53 08/11/2017 16:47 08/11/2017 23:27 08/12/2017 07:53  Glucose-Capillary Latest Ref Range: 65 - 99 mg/dL 409169 (H) 811200 (H) 914211 (H) 229 (H) 141 (H)   Diabetes history: Type 2 DM  Outpatient Diabetes medications: patient not taking Current orders for Inpatient glycemic control:  Novolog moderate tid with meals and HS, Glucotrol 5 mg daily  Inpatient Diabetes Program Recommendations:   May consider adding Novolog 3 units tid with meals (hold if patient eats less than 50%).   Thanks,  Beryl MeagerJenny Yordi Krager, RN, BC-ADM Inpatient Diabetes Coordinator Pager [phone] (8a-5p)

## 2017-08-12 NOTE — Progress Notes (Addendum)
PROGRESS NOTE    Tyrone Cunningham  ZOX:[id number] DOB: 18-May-1955 DOA: 08/04/2017 PCP: Morrell Riddle, PA-C    Brief Narrative:  62 y.o.malewith medical history significant ofsevere OSA requiring trach; HTN; HLD; DM; COPD; and CHF presenting with leg pain and he was admitted for left lower extremity cellulitis and acute kidney injury.  Improving.   Assessment & Plan:   Principal Problem:   Cellulitis and abscess of left leg Active Problems:   Hypertension   Hypercholesteremia   Tracheostomy dependent (HCC)   DM (diabetes mellitus), type 2 with neurological complications (HCC)   Obstructive sleep apnea   Diabetic foot ulcer (HCC)   Acute renal failure (ARF) (HCC)   Left LE Cellulitis; Diabetic foot ulcer Low grade tempeture, LE edema and erythema.  Doppler negative for DVT IV ceftriaxone, and clindamycin was subsequently added due to slow improvement.  X-ray  no evidence of osteomyelitis.  Dressing changes as per wound care team. It is possible that he has chronic venous stasis.  Slowly improving. Consider transitioning to oral doxycycline at discharge.  Acute on Chronic Diastolic HF;  2D echo in 2017 showing EF of 65% with grade 1 diastolic dysfunction Continue with IV lasix 60 mg IV BID.  -11.9 L thus far and weight down by 8 pounds since admission.  Still remains with some volume overload.  venous stasis ulcer.  Continue management per wound care team consult 6/10.  Acute kidney injury Baseline creatinine is0.7.  Was 2 on admission, some improvement with hydration initially, but now patient is volume overloaded, was resumed back on Lasix, continue to monitor renal function . Stable on IV lasix.  Creatinine has decreased to 1.4 and stable in this range for the last 2 days.  Type II DM Stopped medications year ago.  Stopped metformin due to AKI.  Glipizide.  Also on SSI.  Mildly uncontrolled and fluctuating.  Counseled regarding compliance.  May consider adjusting oral  hypoglycemic dose at discharge. A1c 8.6 on 3/26.  Essential hypertension Controlled.  Continue metoprolol.  Hyperlipidemia -LDL control in 3/19 - 99  OSA, Trach dependent respiratory failure -Has trach -Has home O2 -Continue with IV lasix.   Hypomagnesemia Replace and follow.  Morbid obesity/Body mass index is 39.78 kg/m.    DVT prophylaxis: Lovenox.  Code Status: Full code.  Family Communication: None at bedside Disposition Plan: DC home possibly in the next 1 to 2 days pending clinical improvement.   Consultants:   none   Procedures:   none   Antimicrobials:  Ceftriaxone 6-04  Clindamycin 6-10   Subjective: "I am okay".  Indicates that his leg swelling is improving and his left leg feels much better.  No pain reported.  Denies dyspnea.  Objective: Vitals:   08/12/17 0453 08/12/17 0500 08/12/17 1434 08/12/17 1526  BP: (!) 143/76  126/75   Pulse: 73  82   Resp: 19  20   Temp: (!) 97.4 F (36.3 C)  99.2 F (37.3 C)   TempSrc: Axillary  Oral   SpO2: 99%  96% 96%  Weight:  115.2 kg (253 lb 15.5 oz)    Height:        Intake/Output Summary (Last 24 hours) at 08/12/2017 1648 Last data filed at 08/12/2017 1100 Gross per 24 hour  Intake 850 ml  Output 1400 ml  Net -550 ml   Filed Weights   08/10/17 0500 08/11/17 0410 08/12/17 0500  Weight: 115.8 kg (255 lb 4.7 oz) 116.8 kg (257 lb 8 oz) 115.2 kg (  253 lb 15.5 oz)    Examination:  General exam: Pleasant middle-aged male, moderately built and obese, sitting up comfortably in bed. Respiratory system: Slightly diminished breath sounds in the bases with occasional basal crackles but otherwise clear to auscultation.  No increased work of breathing. Cardiovascular system: S1 and S2 heard, RRR.  No JVD or murmurs.  1+ pitting leg edema, left greater than sign right. Gastrointestinal system: Abdomen is nondistended, soft and nontender.  Normal bowel sounds heard. Central nervous system: Alert and oriented  x3.  No focal neurological deficits. Extremities: Symmetric 5 x 5 power. Skin: Removed dressing over left lower extremity.  Faint patchy minimal erythema which is almost resolved.  Chronic skin changes noted.  Has superficial small ulcers over left dorsal foot and left mid leg without drainage or acute findings.     Data Reviewed: I have personally reviewed following labs and imaging studies  CBC: Recent Labs  Lab 08/08/17 0831 08/09/17 0504 08/10/17 0319 08/11/17 0623 08/12/17 0559  WBC 15.1* 15.5* 13.9* 12.6* 10.8*  HGB 11.2* 11.5* 11.5* 11.6* 12.0*  HCT 34.7* 35.3* 35.2* 35.1* 36.7*  MCV 89.0 88.3 87.6 87.1 88.9  PLT 312 315 326 331 330   Basic Metabolic Panel: Recent Labs  Lab 08/06/17 0708  08/08/17 0831 08/09/17 0504 08/10/17 0319 08/11/17 0623 08/12/17 0559  NA 141   < > 137 138 138 137 136  K 4.6   < > 3.8 4.1 3.5 3.9 3.9  CL 100*   < > 97* 95* 97* 97* 97*  CO2 29   < > 30 33* 30 32 31  GLUCOSE 163*   < > 224* 210* 228* 159* 189*  BUN 21*   < > 21* 27* 30* 28* 31*  CREATININE 1.72*   < > 1.66* 1.69* 1.61* 1.47* 1.49*  CALCIUM 8.5*   < > 8.2* 8.5* 8.3* 8.7* 8.7*  MG 1.5*  --   --   --   --  1.5*  --    < > = values in this interval not displayed.   GFR: Estimated Creatinine Clearance: 60.7 mL/min (A) (by C-G formula based on SCr of 1.49 mg/dL (H)).  CBG: Recent Labs  Lab 08/11/17 1647 08/11/17 2327 08/12/17 0753 08/12/17 1214 08/12/17 1631  GLUCAP 211* 229* 141* 189* 155*      Radiology Studies: No results found.      Scheduled Meds: . chlorhexidine  15 mL Mouth Rinse BID  . docusate sodium  100 mg Oral BID  . enoxaparin (LOVENOX) injection  40 mg Subcutaneous Q24H  . furosemide  60 mg Intravenous BID  . glipiZIDE  5 mg Oral QAC breakfast  . insulin aspart  0-15 Units Subcutaneous TID WC  . insulin aspart  0-5 Units Subcutaneous QHS  . mouth rinse  15 mL Mouth Rinse q12n4p  . metoprolol tartrate  75 mg Oral BID   Continuous  Infusions: . cefTRIAXone (ROCEPHIN)  IV Stopped (08/11/17 1853)  . clindamycin (CLEOCIN) IV Stopped (08/12/17 1538)     LOS: 8 days    Time spent: 40 minutes.    Marcellus ScottAnand Jayra Choyce, MD, FACP, [facility]. [facility] Pager [phone]  If 7PM-7AM, please contact night-coverage [email or web address] Password [email or web address] 08/12/2017, 4:58 PM

## 2017-08-12 NOTE — Plan of Care (Signed)
  Problem: Education: Goal: Knowledge of General Education information will improve Outcome: Progressing   Problem: Health Behavior/Discharge Planning: Goal: Ability to manage health-related needs will improve Outcome: Progressing   Problem: Clinical Measurements: Goal: Ability to maintain clinical measurements within normal limits will improve Outcome: Progressing Goal: Will remain free from infection Outcome: Progressing Goal: Diagnostic test results will improve Outcome: Progressing Goal: Respiratory complications will improve Outcome: Progressing Goal: Cardiovascular complication will be avoided Outcome: Progressing   Problem: Activity: Goal: Risk for activity intolerance will decrease Outcome: Progressing   Problem: Nutrition: Goal: Adequate nutrition will be maintained Outcome: Progressing   Problem: Coping: Goal: Level of anxiety will decrease Outcome: Progressing   Problem: Elimination: Goal: Will not experience complications related to bowel motility Outcome: Progressing Goal: Will not experience complications related to urinary retention Outcome: Progressing   Problem: Pain Managment: Goal: General experience of comfort will improve Outcome: Progressing   Problem: Safety: Goal: Ability to remain free from injury will improve Outcome: Progressing   Problem: Skin Integrity: Goal: Risk for impaired skin integrity will decrease Outcome: Progressing   Problem: Clinical Measurements: Goal: Ability to avoid or minimize complications of infection will improve Outcome: Progressing   Problem: Skin Integrity: Goal: Skin integrity will improve Outcome: Progressing   

## 2017-08-13 LAB — COMPREHENSIVE METABOLIC PANEL
ALBUMIN: 2.5 g/dL — AB (ref 3.5–5.0)
ALK PHOS: 234 U/L — AB (ref 38–126)
ALT: 61 U/L (ref 17–63)
ANION GAP: 10 (ref 5–15)
AST: 92 U/L — ABNORMAL HIGH (ref 15–41)
BUN: 35 mg/dL — ABNORMAL HIGH (ref 6–20)
CALCIUM: 9.2 mg/dL (ref 8.9–10.3)
CO2: 32 mmol/L (ref 22–32)
CREATININE: 1.55 mg/dL — AB (ref 0.61–1.24)
Chloride: 95 mmol/L — ABNORMAL LOW (ref 101–111)
GFR calc Af Amer: 53 mL/min — ABNORMAL LOW (ref >60)
GFR calc non Af Amer: 46 mL/min — ABNORMAL LOW (ref >60)
GLUCOSE: 145 mg/dL — AB (ref 65–99)
Potassium: 4.3 mmol/L (ref 3.5–5.1)
SODIUM: 137 mmol/L (ref 135–145)
Total Bilirubin: 0.5 mg/dL (ref 0.3–1.2)
Total Protein: 7.4 g/dL (ref 6.5–8.1)

## 2017-08-13 LAB — GLUCOSE, CAPILLARY
GLUCOSE-CAPILLARY: 183 mg/dL — AB (ref 65–99)
Glucose-Capillary: 156 mg/dL — ABNORMAL HIGH (ref 65–99)
Glucose-Capillary: 273 mg/dL — ABNORMAL HIGH (ref 65–99)
Glucose-Capillary: 249 mg/dL — ABNORMAL HIGH (ref 65–99)

## 2017-08-13 LAB — MAGNESIUM: Magnesium: 2.1 mg/dL (ref 1.7–2.4)

## 2017-08-13 NOTE — Progress Notes (Signed)
PT Cancellation Note  Patient Details Name: Tyrone Cunningham MRN: [id number] DOB: 08/28/1955   Cancelled Treatment:    Reason Eval/Treat Not Completed: Patient declined, no reason specified. Pt continues to decline PT services. Per nurse tech, pt has been up and mobilizing to and from the restroom and transferring w/o assist. Girl friend present in room and reports she feels pt is at baseline mobilization. D/c pt from physical therapy services per protocol. Please re-order if appropriate.   Tyrone Cunningham, PTA Pager [phone] Acute Rehab   Sheral ApleyHannah E Laquanta Cunningham 08/13/2017, 11:59 AM

## 2017-08-13 NOTE — Care Management Note (Signed)
Case Management Note  Patient Details  Name: Tyrone BaileyMark Cunningham MRN: [id number] Date of Birth: 06/17/1955  Subjective/Objective:  ARF, cellulitis/abscess of L leg                 PCP: Benny LennertSarah Weber, [phone]  Action/Plan: Transition to home with home health services to follow. Pt states will need transportation to home. NCM made [name] aware for potential taxi voucher. NCM scheduled post hospital f/u appointment PCP for 08/14/2017 @ 3pm, noted on AVS.  Expected Discharge Date:   08/14/2017           Expected Discharge Plan:  Home w Home Health Services  In-House Referral:   [name]  Discharge planning Services  CM Consult  Post Acute Care Choice:    Choice offered to:  Patient  DME Arranged:   N/A DME Agency:   N/A  HH Arranged:  PT, RN, Social Work [employer] Agency:  [facility]  Status of Service:  Completed, signed off  If discussed at [employer] Meetings, dates discussed:    Additional Comments:  Tyrone Cunningham, Tyrone Frane Hudson, RN 08/13/2017, 11:06 AM

## 2017-08-13 NOTE — Plan of Care (Signed)
  Problem: Education: Goal: Knowledge of General Education information will improve Outcome: Progressing Note:  POC reviewed with pt.   

## 2017-08-13 NOTE — Telephone Encounter (Addendum)
Patient called and states he is still in the hospital and cant make the appt tomorrow. He is wondering if the provider can complete the paperwork  and [facility] o him having an appointment . Please see below message. Patient states you can call if an appointment needed. CB # [phone].

## 2017-08-13 NOTE — Progress Notes (Signed)
PROGRESS NOTE    Tyrone Cunningham  ZOX:[id number] DOB: 1955/11/08 DOA: 08/04/2017 PCP: Morrell Riddle, PA-C    Brief Narrative:  62 y.o.malewith medical history significant ofsevere OSA requiring trach; HTN; HLD; DM; COPD; and CHF presenting with leg pain and he was admitted for left lower extremity cellulitis and acute kidney injury.  Improving.   Assessment & Plan:   Principal Problem:   Cellulitis and abscess of left leg Active Problems:   Hypertension   Hypercholesteremia   Tracheostomy dependent (HCC)   DM (diabetes mellitus), type 2 with neurological complications (HCC)   Obstructive sleep apnea   Diabetic foot ulcer (HCC)   Acute renal failure (ARF) (HCC)   Left LE Cellulitis; Diabetic foot ulcer Low grade tempeture, LE edema and erythema.  Doppler negative for DVT IV ceftriaxone, and clindamycin was subsequently added due to slow improvement.  X-ray  no evidence of osteomyelitis.  Dressing changes as per wound care team. It is possible that he has chronic venous stasis.  Slowly improving. Consider transitioning to oral doxycycline at discharge possibly 6/14.  Acute on Chronic Diastolic HF;  2D echo in 2017 showing EF of 65% with grade 1 diastolic dysfunction Treated with IV lasix 60 mg IV BID.  - 12.5 L thus far and weight down by 8 pounds since admission.  Volume status is improved and now creatinine starting to raise up.  Discontinued IV Lasix.  Follow creatinine in a.m. and consider oral Lasix.  venous stasis ulcer.  Continue management per wound care team consult 6/10.  Acute kidney injury Baseline creatinine is0.7.  Was 2 on admission, some improvement with hydration initially, but now patient is volume overloaded, was resumed back on Lasix, continue to monitor renal function . Stable on IV lasix.  Creatinine has decreased to 1.4 and stable in this range for the last 2 days.  Creatinine has increased to 1.55.  Hold Lasix.  Follow BMP in a.m.  Type II DM Stopped  medications year ago.  Stopped metformin due to AKI.  Glipizide.  Also on SSI.  Mildly uncontrolled and fluctuating.  Counseled regarding compliance.  May consider adjusting oral hypoglycemic dose at discharge. A1c 8.6 on 3/26.  Essential hypertension Controlled.  Continue metoprolol.  Hyperlipidemia -LDL control in 3/19 - 99  OSA, Trach dependent respiratory failure -Has trach -Has home O2 -Continue with IV lasix.   Hypomagnesemia Replace and follow.  Morbid obesity/Body mass index is 39.78 kg/m.    DVT prophylaxis: Lovenox.  Code Status: Full code.  Family Communication: None at bedside Disposition Plan: DC home possibly in the next 1 to 2 days pending clinical improvement.   Consultants:   none   Procedures:   none   Antimicrobials:  Ceftriaxone 6-04  Clindamycin 6-10   Subjective: Denies complaints.  Indicates that his leg swelling has much improved.  No dyspnea or chest pain reported.  Objective: Vitals:   08/13/17 1014 08/13/17 1206 08/13/17 1442 08/13/17 1531  BP:   138/65 138/62  Pulse: 76  70 70  Resp: 16  20 20   Temp:   98.8 F (37.1 C) 98.8 F (37.1 C)  TempSrc:   Oral Oral  SpO2: 100% 100% 100%   Weight:      Height:        Intake/Output Summary (Last 24 hours) at 08/13/2017 1733 Last data filed at 08/13/2017 1531 Gross per 24 hour  Intake 702 ml  Output 1100 ml  Net -398 ml   Filed Weights   08/10/17  0500 08/11/17 0410 08/12/17 0500  Weight: 115.8 kg (255 lb 4.7 oz) 116.8 kg (257 lb 8 oz) 115.2 kg (253 lb 15.5 oz)    Examination:  General exam: Pleasant middle-aged male, moderately built and obese, sitting up comfortably in bed. Respiratory system: Slightly diminished breath sounds in the bases with occasional basal crackles but otherwise clear to auscultation.  No increased work of breathing.  Stable Cardiovascular system: S1 and S2 heard, RRR.  No JVD or murmurs.  1+ pitting leg edema, left > right.   Stable. Gastrointestinal system: Abdomen is nondistended, soft and nontender.  Normal bowel sounds heard. Central nervous system: Alert and oriented x3.  No focal neurological deficits. Extremities: Symmetric 5 x 5 power. Skin: Removed dressing over left lower extremity.  Faint patchy minimal erythema which is almost resolved.  Chronic skin changes noted.  Has superficial small ulcers over left dorsal foot and left mid leg without drainage or acute findings.     Data Reviewed: I have personally reviewed following labs and imaging studies  CBC: Recent Labs  Lab 08/08/17 0831 08/09/17 0504 08/10/17 0319 08/11/17 0623 08/12/17 0559  WBC 15.1* 15.5* 13.9* 12.6* 10.8*  HGB 11.2* 11.5* 11.5* 11.6* 12.0*  HCT 34.7* 35.3* 35.2* 35.1* 36.7*  MCV 89.0 88.3 87.6 87.1 88.9  PLT 312 315 326 331 330   Basic Metabolic Panel: Recent Labs  Lab 08/09/17 0504 08/10/17 0319 08/11/17 0623 08/12/17 0559 08/13/17 0452  NA 138 138 137 136 137  K 4.1 3.5 3.9 3.9 4.3  CL 95* 97* 97* 97* 95*  CO2 33* 30 32 31 32  GLUCOSE 210* 228* 159* 189* 145*  BUN 27* 30* 28* 31* 35*  CREATININE 1.69* 1.61* 1.47* 1.49* 1.55*  CALCIUM 8.5* 8.3* 8.7* 8.7* 9.2  MG  --   --  1.5*  --  2.1   GFR: Estimated Creatinine Clearance: 58.4 mL/min (A) (by C-G formula based on SCr of 1.55 mg/dL (H)).  CBG: Recent Labs  Lab 08/12/17 1631 08/12/17 2134 08/13/17 0754 08/13/17 1150 08/13/17 1713  GLUCAP 155* 224* 156* 273* 183*      Radiology Studies: No results found.      Scheduled Meds: . chlorhexidine  15 mL Mouth Rinse BID  . docusate sodium  100 mg Oral BID  . enoxaparin (LOVENOX) injection  40 mg Subcutaneous Q24H  . glipiZIDE  5 mg Oral QAC breakfast  . insulin aspart  0-15 Units Subcutaneous TID WC  . insulin aspart  0-5 Units Subcutaneous QHS  . mouth rinse  15 mL Mouth Rinse q12n4p  . metoprolol tartrate  75 mg Oral BID   Continuous Infusions: . cefTRIAXone (ROCEPHIN)  IV Stopped  (08/12/17 1730)  . clindamycin (CLEOCIN) IV Stopped (08/13/17 1457)     LOS: 9 days    Time spent: 40 minutes.    Marcellus ScottAnand Claudell Rhody, MD, FACP, [facility]. [facility] Pager [phone]  If 7PM-7AM, please contact night-coverage [email or web address] Password [facility] 08/13/2017, 5:33 PM

## 2017-08-13 NOTE — Progress Notes (Signed)
Physical Therapy Discharge Patient Details Name: Tyrone BaileyMark Cunningham MRN: [id number] DOB: 08/30/1955 Today's Date: 08/13/2017 Time:  -     Patient discharged from PT services secondary to patient has refused 3 (three) consecutive times without medical reason.  Please see latest therapy progress note for current level of functioning and progress toward goals.    Progress and discharge plan discussed with patient and/or caregiver: Patient/Caregiver agrees with plan  Kallie LocksHannah Evren Shankland, PTA Pager [phone] Acute Rehab   Sheral ApleyHannah E Dondi Aime 08/13/2017, 1:05 PM

## 2017-08-13 NOTE — Telephone Encounter (Signed)
Message sent to Maralyn SagoSarah to determine if she can fill out paperwork

## 2017-08-13 NOTE — Progress Notes (Signed)
[name] received referral regarding possible need for housing resources. Patient reported that [facility] with his roommate and will return there at discharge. He reports that patient's roommate's mother is looking for a new place for them to live. [name] offered housing resources, which patient accepted. Patient reported that he hopes to go to his Primary care MD appointment tomorrow to get his motorized wheelchair.   [name] signing off. Please contact [name] if patient requires transportation.   Osborne Cascoadia Halana Deisher LCSW [phone]

## 2017-08-14 ENCOUNTER — Inpatient Hospital Stay: Payer: Medicare Other | Admitting: Physician Assistant

## 2017-08-14 DIAGNOSIS — N189 Chronic kidney disease, unspecified: Secondary | ICD-10-CM

## 2017-08-14 LAB — GLUCOSE, CAPILLARY
Glucose-Capillary: 205 mg/dL — ABNORMAL HIGH (ref 65–99)
Glucose-Capillary: 205 mg/dL — ABNORMAL HIGH (ref 65–99)

## 2017-08-14 LAB — BASIC METABOLIC PANEL
ANION GAP: 8 (ref 5–15)
BUN: 38 mg/dL — ABNORMAL HIGH (ref 6–20)
CO2: 31 mmol/L (ref 22–32)
Calcium: 9 mg/dL (ref 8.9–10.3)
Chloride: 96 mmol/L — ABNORMAL LOW (ref 101–111)
Creatinine, Ser: 1.66 mg/dL — ABNORMAL HIGH (ref 0.61–1.24)
GFR calc Af Amer: 49 mL/min — ABNORMAL LOW (ref >60)
GFR, EST NON AFRICAN AMERICAN: 42 mL/min — AB (ref >60)
GLUCOSE: 207 mg/dL — AB (ref 65–99)
POTASSIUM: 4.4 mmol/L (ref 3.5–5.1)
Sodium: 135 mmol/L (ref 135–145)

## 2017-08-14 MED ORDER — FUROSEMIDE 20 MG PO TABS: 20.0000 mg | ORAL_TABLET | Freq: Every day | ORAL | 0 refills | Status: DC

## 2017-08-14 MED ORDER — DOXYCYCLINE HYCLATE 100 MG PO TABS: 100.0000 mg | ORAL_TABLET | Freq: Two times a day (BID) | ORAL | 0 refills | Status: DC

## 2017-08-14 MED ORDER — METOPROLOL TARTRATE 75 MG PO TABS: 75.0000 mg | ORAL_TABLET | Freq: Two times a day (BID) | ORAL | 0 refills | Status: DC

## 2017-08-14 MED ORDER — GLIPIZIDE 5 MG PO TABS: 5.0000 mg | ORAL_TABLET | Freq: Two times a day (BID) | ORAL | 0 refills | Status: DC

## 2017-08-14 MED ORDER — ATORVASTATIN CALCIUM 20 MG PO TABS: 20.0000 mg | ORAL_TABLET | Freq: Every day | ORAL | 0 refills | Status: DC

## 2017-08-14 NOTE — Progress Notes (Addendum)
Pt getting showered up and called out to the desk. I assessed pt, his IV was out. Pt said it had pulled out. Site not bleeding, friend stated she had held pressure until it stopped.   1200 - dressing to left leg changed. Xeroform gauze applied to open areas, then ABD pads placed over. Kerlex dry gauze wrapped around leg from toes to knee, held in place with tape on the gauze. ACE wraps applied toes to knee. Pt given supplies for another change.

## 2017-08-14 NOTE — Progress Notes (Signed)
Inpatient Diabetes Program Recommendations  AACE/ADA: New Consensus Statement on Inpatient Glycemic Control (2015)  Target Ranges:  Prepandial:   less than 140 mg/dL      Peak postprandial:   less than 180 mg/dL (1-2 hours)      Critically ill patients:  140 - 180 mg/dL   Lab Results  Component Value Date   GLUCAP 205 (H) 08/14/2017   HGBA1C 8.6 (H) 05/26/2017    Review of Glycemic ControlResults for Tyrone Cunningham, Ki (MRN [id number]) as of 08/14/2017 10:37  Ref. Range 08/13/2017 07:54 08/13/2017 11:50 08/13/2017 17:13 08/13/2017 20:21 08/14/2017 07:59  Glucose-Capillary Latest Ref Range: 65 - 99 mg/dL 409156 (H) 811273 (H) 914183 (H) 249 (H) 205 (H)   Diabetes history: Type 2 DM  Outpatient Diabetes medications: patient not taking Current orders for Inpatient glycemic control:  Novolog moderate tid with meals and HS, Glucotrol 5 mg daily  Inpatient Diabetes Program Recommendations:    Blood sugars >goal.  Consider adding Lantus 15 units daily?    Thanks  Beryl MeagerJenny Emanuell Morina, RN, BC-ADM Inpatient Diabetes Coordinator Pager [phone] (8a-5p)

## 2017-08-14 NOTE — Progress Notes (Signed)
AVS given to pt and discussed. Pt verbalized understanding. Discussed follow up and medication regimen. Pt verbalized understanding. Volunteer services called for wheelchair ride to lobby. Pt in stable condition.

## 2017-08-14 NOTE — Telephone Encounter (Signed)
Unfortunately with medicare the questions have to answered in a face to face appt - I would see if this could be done in the hospital while he is there.  Maybe by a Child psychotherapistsocial worker?

## 2017-08-14 NOTE — Discharge Instructions (Signed)

## 2017-08-14 NOTE — Telephone Encounter (Signed)
Called pt. Pt has OV today at 1500

## 2017-08-14 NOTE — Progress Notes (Signed)
[name] provided taxi voucher to patient's Primary care appointment at 3pm today.  [name] signing off.  Osborne Cascoadia Merwyn Hodapp LCSW [phone]

## 2017-08-14 NOTE — Discharge Summary (Signed)
Physician Discharge Summary  Jacquel Mccamish ZOX:[id number] DOB: [birth year]  PCP: Morrell Riddle, PA-C  Admit date: 08/04/2017 Discharge date: 08/14/2017  Recommendations for Outpatient Follow-up:  1. Benny Lennert, PA-C: Patient has appointment to be seen today at 3 PM.  Subsequently needs to be seen in 5 to 7 days with repeat labs (CBC & BMP).  According to patient, PCP is assisting him with a motorized wheelchair procurement.  Home Health: PT, RN and clinical social work. Equipment/Devices: None.  Discharge Condition: Improved and stable. CODE STATUS: Full Diet recommendation: Heart healthy and diabetic diet.  Discharge Diagnoses:  Principal Problem:   Cellulitis and abscess of left leg Active Problems:   Hypertension   Hypercholesteremia   Tracheostomy dependent (HCC)   DM (diabetes mellitus), type 2 with neurological complications (HCC)   Obstructive sleep apnea   Diabetic foot ulcer (HCC)   Acute renal failure (ARF) (HCC)   Brief Summary: 62 y.o.malewith medical history significant ofsevere OSA requiring trach; HTN; HLD; DM; COPD; and CHF presenting with leg pain and he was admitted for left lower extremity cellulitis and acute kidney injury.    Assessment & Plan:   Left LE Cellulitis; Diabetic foot ulcer Low grade tempeture, LE edema and erythema.  Doppler negative for DVT IV ceftriaxone, and clindamycin was subsequently added due to slow improvement.  X-ray  no evidence of osteomyelitis.  Dressing changes as per wound care team. It is possible that he has chronic venous stasis.   Patient has steadily improved.  Completed approximately 10 days of IV antibiotics.  Transitioned to oral doxycycline to complete total 14 days treatment.  Home health RN arranged to assist with wound care as advised by wound care team.  Acute on Chronic Diastolic HF;  2D echo in 2017 showing EF of 65% with grade 1 diastolic dysfunction Treated with IV lasix 60 mg IV BID.  - 14 L thus far and  weight down by 8 pounds since admission.  -Volume status significantly improved and simultaneously creatinine also increased.  Thereby IV Lasix was discontinued yesterday.  Continue to hold oral Lasix for 2 to 3 days then resume at low-dose 20 mg daily with close BMP monitoring as outpatient.  Patient counseled regarding compliance with all aspects of medical care including salt restriction.  Venous stasis ulcer.  Continue management per wound care team consult 6/10.  Dressing was taken down and leg was examined.  No further signs of acute infection.  Improving.  Acute kidney injury Baseline creatinine is0.7 in June 2017. Was 2 on admission, some improvement with hydration initially, but now patient is volume overloaded, was resumed back on Lasix, continue to monitor renal function . Since admission patient's creatinine has fluctuated between 1.4-1.7.  Wonder if patient has progressed in his chronic kidney disease.  IV Lasix was discontinued.  Continue to hold diuretics for a couple more days before initiating low-dose given volume overload.  Close BMP monitoring as outpatient.  Type II DM A1c 8.6 on 3/26.  Patient was initiated on Glucotrol and SSI in the hospital.  Patient has been noncompliant with all his medications at home.  At discharge, adjusted Glucotrol dose.  Follow closely as outpatient.  Essential hypertension Controlled.  Continue metoprolol.  Hyperlipidemia -LDL control in 3/19 - 99  OSA, Trach dependent respiratory failure -Has trach -Has home O2 Stable.  Hypomagnesemia Replaced.  Morbid obesity/Body mass index is 39.78 kg/m.  Adult failure to thrive Multifactorial related to multiple complex medical conditions, difficult social situation/lives in a  hotel, poor insight into his medical problems.    Consultants:   none   Procedures:   none      Discharge Instructions  Discharge Instructions    (HEART FAILURE PATIENTS) Call MD:  Anytime  you have any of the following symptoms: 1) 3 pound weight gain in 24 hours or 5 pounds in 1 week 2) shortness of breath, with or without a dry hacking cough 3) swelling in the hands, feet or stomach 4) if you have to sleep on extra pillows at night in order to breathe.   Complete by:  As directed    Call MD for:  difficulty breathing, headache or visual disturbances   Complete by:  As directed    Call MD for:  extreme fatigue   Complete by:  As directed    Call MD for:  persistant dizziness or light-headedness   Complete by:  As directed    Call MD for:  persistant nausea and vomiting   Complete by:  As directed    Call MD for:  redness, tenderness, or signs of infection (pain, swelling, redness, odor or green/yellow discharge around incision site)   Complete by:  As directed    Call MD for:  severe uncontrolled pain   Complete by:  As directed    Call MD for:  temperature >100.4   Complete by:  As directed    Diet - low sodium heart healthy   Complete by:  As directed    Diet Carb Modified   Complete by:  As directed    Discharge wound care:   Complete by:  As directed    As per wound care team instructions as below:  Dressing procedure/placement/frequency:COntinue Xerofrom, kerlix and ace bandage for modified light compression.  Elevation of heel and offloading with prevalon boot.   Increase activity slowly   Complete by:  As directed        Medication List    STOP taking these medications   gabapentin 400 MG capsule Commonly known as:  NEURONTIN   metFORMIN 500 MG 24 hr tablet Commonly known as:  GLUCOPHAGE XR   sitaGLIPtin 100 MG tablet Commonly known as:  JANUVIA   umeclidinium bromide 62.5 MCG/INH Aepb Commonly known as:  INCRUSE ELLIPTA     TAKE these medications   albuterol (2.5 MG/3ML) 0.083% nebulizer solution Commonly known as:  PROVENTIL Take 3 mLs (2.5 mg total) by nebulization 4 (four) times daily.   atorvastatin 20 MG tablet Commonly known as:   LIPITOR Take 1 tablet (20 mg total) by mouth daily.   doxycycline 100 MG tablet Commonly known as:  VIBRA-TABS Take 1 tablet (100 mg total) by mouth 2 (two) times daily.   furosemide 20 MG tablet Commonly known as:  LASIX Take 1 tablet (20 mg total) by mouth daily. Start taking on:  08/17/2017   glipiZIDE 5 MG tablet Commonly known as:  GLUCOTROL Take 1 tablet (5 mg total) by mouth 2 (two) times daily before a meal.   Metoprolol Tartrate 75 MG Tabs Take 75 mg by mouth 2 (two) times daily.   OXYGEN Inhale 10 L into the lungs continuous.   traMADol 50 MG tablet Commonly known as:  ULTRAM Take 50 mg by mouth every 6 (six) hours as needed for moderate pain.      Follow-up Information    [facility] Follow up.   Specialty:  Home Health Services Why:  home health services arranged Contact information: [phone] [facility]  [address] [place] [address] [phone]        Huey Romans PA. Go on 08/14/2017.   Why:  3pm, hospital follow up.  Patient then needs to be seen in 5 to 7 days time with repeat labs (CBC & BMP). Contact information: Virgilio Belling Address: [address] [place], [place] [address] Phone: [phone]         No Known Allergies    Procedures/Studies: Dg Tibia/fibula Left  Result Date: 08/04/2017 CLINICAL DATA:  Left leg pain, swelling and weeping. Area on anterior lower left leg and dorsal left foot that are open wounds with yellow-ish pus. Hx diabetes EXAM: LEFT TIBIA AND FIBULA - 2 VIEW COMPARISON:  None. FINDINGS: AP and lateral views of the tibia and fibula are provided. Osseous alignment is normal. Bone mineralization is normal. No acute or suspicious osseous lesion. Adjacent soft tissues are unremarkable. No soft tissue gas seen. IMPRESSION: Negative plain film examination of the LEFT tibia and fibula. No evidence of osteomyelitis. No soft tissue gas seen. Electronically Signed   By: Bary Richard M.D.   On: 08/04/2017 13:46   Dg  Chest Port 1 View  Result Date: 08/07/2017 CLINICAL DATA:  Shortness of breath EXAM: PORTABLE CHEST 1 VIEW COMPARISON:  04/29/2017 FINDINGS: Tracheostomy is unchanged. Mild cardiomegaly. No confluent airspace opacities or effusions. No acute bony abnormality. IMPRESSION: Borderline heart size.  No active disease. Electronically Signed   By: Charlett Nose M.D.   On: 08/07/2017 09:13   Dg Toe Great Left  Result Date: 08/04/2017 CLINICAL DATA:  Great toe ulcer. EXAM: LEFT GREAT TOE COMPARISON:  None. FINDINGS: No acute fracture or dislocation. Moderate first MTP joint space narrowing with marginal osteophyte formation. No osteolysis or cortical destruction. Vascular calcifications. IMPRESSION: 1. No radiographic evidence of osteomyelitis. 2. Moderate first MTP joint osteoarthritis. Electronically Signed   By: Obie Dredge M.D.   On: 08/04/2017 16:37      Subjective: Patient denies complaints.  Denies dyspnea.  Leg swelling has improved.  Anxious to go home so that he can keep up PCPs appointment this afternoon.  Shunt was interviewed and examined along with his male friend at bedside and RN.  Discharge Exam:  Vitals:   08/14/17 0401 08/14/17 0520 08/14/17 0859 08/14/17 0903  BP:  (!) 127/59    Pulse: 71 68 72   Resp: 20 18 18    Temp:  98.2 F (36.8 C)    TempSrc:  Oral    SpO2: 99% 99% 99%   Weight:    115.2 kg (254 lb)  Height:        General exam: Pleasant middle-aged male, moderately built and obese, sitting up comfortably in bed. Respiratory system: Clear to auscultation.  No increased work of breathing.  Cardiovascular system: S1 and S2 heard, RRR.  No JVD or murmurs.    Trace bilateral leg edema, left > right with chronicity. Gastrointestinal system: Abdomen is nondistended, soft and nontender.  Normal bowel sounds heard. Central nervous system: Alert and oriented x3.  No focal neurological deficits. Extremities: Symmetric 5 x 5 power. Skin: Removed dressing over left lower  extremity.  Now has residual chronic edema changes including lichenification of skin but no features of cellulitis which have since resolved.  Scabbing of multiple small ulcers over the lateral aspect of left lower leg, dorsum of foot.  Has chronic ulcer over the plantar aspect of left great toe tip without acute findings.     The results of significant  diagnostics from this hospitalization (including imaging, microbiology, ancillary and laboratory) are listed below for reference.     Microbiology: No results found for this or any previous visit (from the past 240 hour(s)).   Labs: CBC: Recent Labs  Lab 08/08/17 0831 08/09/17 0504 08/10/17 0319 08/11/17 0623 08/12/17 0559  WBC 15.1* 15.5* 13.9* 12.6* 10.8*  HGB 11.2* 11.5* 11.5* 11.6* 12.0*  HCT 34.7* 35.3* 35.2* 35.1* 36.7*  MCV 89.0 88.3 87.6 87.1 88.9  PLT 312 315 326 331 330   Basic Metabolic Panel: Recent Labs  Lab 08/10/17 0319 08/11/17 0623 08/12/17 0559 08/13/17 0452 08/14/17 0659  NA 138 137 136 137 135  K 3.5 3.9 3.9 4.3 4.4  CL 97* 97* 97* 95* 96*  CO2 30 32 31 32 31  GLUCOSE 228* 159* 189* 145* 207*  BUN 30* 28* 31* 35* 38*  CREATININE 1.61* 1.47* 1.49* 1.55* 1.66*  CALCIUM 8.3* 8.7* 8.7* 9.2 9.0  MG  --  1.5*  --  2.1  --    Liver Function Tests: Recent Labs  Lab 08/13/17 0452  AST 92*  ALT 61  ALKPHOS 234*  BILITOT 0.5  PROT 7.4  ALBUMIN 2.5*   BNP (last 3 results) Recent Labs    04/29/17 0930  BNP 40.4   CBG: Recent Labs  Lab 08/13/17 1150 08/13/17 1713 08/13/17 2021 08/14/17 0759 08/14/17 1207  GLUCAP 273* 183* 249* 205* 205*    Discussed in detail with patient's male friend at bedside.  Updated care and answered questions.  Discussed with RN, clinical social work and case management.   Time coordinating discharge: 40 minutes  SIGNED:  Marcellus Scott, MD, FACP, [facility]. [facility] Pager [phone]  If 7PM-7AM, please contact  night-coverage [email or web address] Password [facility] 08/14/2017, 6:07 PM

## 2017-08-15 ENCOUNTER — Other Ambulatory Visit: Payer: Self-pay

## 2017-08-15 ENCOUNTER — Ambulatory Visit (INDEPENDENT_AMBULATORY_CARE_PROVIDER_SITE_OTHER): Payer: Medicare Other | Admitting: Physician Assistant

## 2017-08-15 ENCOUNTER — Encounter: Payer: Self-pay | Admitting: Physician Assistant

## 2017-08-15 VITALS — BP 130/74 | HR 105 | Temp 99.2°F | Resp 18 | Ht 67.0 in | Wt 255.0 lb

## 2017-08-15 DIAGNOSIS — L03116 Cellulitis of left lower limb: Secondary | ICD-10-CM

## 2017-08-15 DIAGNOSIS — I509 Heart failure, unspecified: Secondary | ICD-10-CM | POA: Diagnosis not present

## 2017-08-15 DIAGNOSIS — E11621 Type 2 diabetes mellitus with foot ulcer: Secondary | ICD-10-CM

## 2017-08-15 DIAGNOSIS — J9610 Chronic respiratory failure, unspecified whether with hypoxia or hypercapnia: Secondary | ICD-10-CM

## 2017-08-15 DIAGNOSIS — I739 Peripheral vascular disease, unspecified: Secondary | ICD-10-CM | POA: Diagnosis not present

## 2017-08-15 DIAGNOSIS — G894 Chronic pain syndrome: Secondary | ICD-10-CM

## 2017-08-15 DIAGNOSIS — R0602 Shortness of breath: Secondary | ICD-10-CM | POA: Diagnosis not present

## 2017-08-15 DIAGNOSIS — E1149 Type 2 diabetes mellitus with other diabetic neurological complication: Secondary | ICD-10-CM

## 2017-08-15 DIAGNOSIS — I10 Essential (primary) hypertension: Secondary | ICD-10-CM

## 2017-08-15 DIAGNOSIS — J449 Chronic obstructive pulmonary disease, unspecified: Secondary | ICD-10-CM | POA: Diagnosis not present

## 2017-08-15 DIAGNOSIS — E1142 Type 2 diabetes mellitus with diabetic polyneuropathy: Secondary | ICD-10-CM

## 2017-08-15 DIAGNOSIS — L97529 Non-pressure chronic ulcer of other part of left foot with unspecified severity: Secondary | ICD-10-CM

## 2017-08-15 DIAGNOSIS — G4733 Obstructive sleep apnea (adult) (pediatric): Secondary | ICD-10-CM | POA: Diagnosis not present

## 2017-08-15 DIAGNOSIS — Z930 Tracheostomy status: Secondary | ICD-10-CM

## 2017-08-15 DIAGNOSIS — Z599 Problem related to housing and economic circumstances, unspecified: Secondary | ICD-10-CM

## 2017-08-15 DIAGNOSIS — Z598 Other problems related to housing and economic circumstances: Secondary | ICD-10-CM

## 2017-08-15 DIAGNOSIS — N179 Acute kidney failure, unspecified: Secondary | ICD-10-CM

## 2017-08-15 DIAGNOSIS — Z09 Encounter for follow-up examination after completed treatment for conditions other than malignant neoplasm: Secondary | ICD-10-CM

## 2017-08-15 DIAGNOSIS — Z5989 Other problems related to housing and economic circumstances: Secondary | ICD-10-CM

## 2017-08-15 DIAGNOSIS — Z6839 Body mass index (BMI) 39.0-39.9, adult: Secondary | ICD-10-CM

## 2017-08-15 MED ORDER — TRAMADOL HCL 50 MG PO TABS: 100.0000 mg | ORAL_TABLET | Freq: Four times a day (QID) | ORAL | 0 refills | Status: DC | PRN

## 2017-08-15 NOTE — Progress Notes (Signed)
Tyrone BaileyMark Cunningham  MRN: [id number] DOB: 11/30/1955  PCP: Morrell RiddleWeber, Tyrone L, PA-C    Subjective:  Pt presents to clinic for evaluation of a motorized wheelchair. [place] [facility] [place] a roommate.  Pt has difficulty ambulating due to has [name] with frequent exacerbations; CHF (congestive heart failure) (HCC); Hypertension; Hypercholesteremia; Hearing loss; Diabetic neuropathy (HCC); Tracheostomy dependent (HCC); DM (diabetes mellitus), type 2 with neurological complications (HCC); Obesity; Edema; Chronic respiratory failure (HCC); Constipation, chronic; Constipation; Chronic pain syndrome; Peripheral vascular disease (HCC); SOB (shortness of breath); Tracheostomy care [facility](HCC); Obstructive sleep apnea  And Diabetic foot ulcer (HCC).  He had been using a motorized wheelchair until it broke about 6 months ago.  He tried to have it fixed but it was deemed not repairable.  He has been using a manual wheelchair for about 3 months.  He is unable to self propel due to any activity causes shortness of breath.  This can caused him to be stuck at home as his wheelchair is his main mode of transportation around town to get to the bus stops.    Pt has difficulty moving around his living quarters due to his shortness of breath.  He is unable to do multiple tasks at once due to his shortness of breath.  He gets so short of breath he has to plan his trips to make sure there is always a chair around so he does not end up falling on the floor if he was walking.  He wears oxygen at home but even with this ambulation causes intense shortness of breath and hypoxemia.  He can not stand to prepare meals due to shortness of breath.  He cannot walk to bathroom.    Pt in unable to use a cane and or walker due to unable to perform that level of activity and balance problems due to DM neuropathy.  Pt is unable to self-propel a manual wheelchair due to distance that he travels with his wheelchair  as this is his only mode of transportation around town.  The upper body strength that is needed is also not present to propel a manual wheelchair.  The types of activity causes extreme shortness of breath.  He is unable to use steering tiller motorized chair due to his tracheostomy and the inability to have 2 hands on the steering tiller at the same time and talk.  He needs to have a hand free at all times to be able to use his trach.  When the patient has a motorized wheelchair with single hand controller he is able to move around his house without problems.  He is able to get to the bus stop and do his daily activities around time, such as medical office visits and shopping for food and clothing.  He does not have access to people to drive him to theses locations and he needs to be self sufficient. The motorized wheel chair allows him to be self sufficient.  Pt is alert and oriented and can safely use a motorized wheelchair.  Do not expect the patient's condition to change over his lifetime.    History is obtained by patient.  Review of Systems  Patient Active Problem List   Diagnosis Date Noted  . Cellulitis and abscess of left leg 08/04/2017  . Diabetic foot ulcer (HCC) 08/04/2017  . Acute renal failure (ARF) (HCC) 08/04/2017  . SOB (shortness of breath) 09/13/2016  . Peripheral vascular disease (HCC) 08/19/2016  .  Tracheostomy care (HCC) 06/28/2015  . Obstructive sleep apnea 06/28/2015  . Chronic pain syndrome 04/18/2015  . Constipation 01/07/2014  . Constipation, chronic 01/04/2014  . Chronic respiratory failure (HCC) 01/06/2013  . Edema 03/16/2012  . Obesity 10/20/2011  . 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 [name] with frequent exacerbations   . CHF (congestive heart failure) (HCC)   . Hypertension   . Hypercholesteremia   . Hearing loss   . Diabetic neuropathy (HCC)      Current Outpatient Medications on File Prior to Visit  Medication Sig Dispense Refill  . albuterol (PROVENTIL) (2.5 MG/3ML) 0.083% nebulizer solution Take 3 mLs (2.5 mg total) by nebulization 4 (four) times daily. 1080 mL 3  . atorvastatin (LIPITOR) 20 MG tablet Take 1 tablet (20 mg total) by mouth daily. 30 tablet 0  . doxycycline (VIBRA-TABS) 100 MG tablet Take 1 tablet (100 mg total) by mouth 2 (two) times daily. 8 tablet 0  . [START ON 08/17/2017] furosemide (LASIX) 20 MG tablet Take 1 tablet (20 mg total) by mouth daily. 30 tablet 0  . glipiZIDE (GLUCOTROL) 5 MG tablet Take 1 tablet (5 mg total) by mouth 2 (two) times daily before a meal. 60 tablet 0  . Metoprolol Tartrate 75 MG TABS Take 75 mg by mouth 2 (two) times daily. 60 tablet 0  . OXYGEN Inhale 10 L into the lungs continuous.    . traMADol (ULTRAM) 50 MG tablet Take 50 mg by mouth every 6 (six) hours as needed for moderate pain.     No current facility-administered medications on file prior to visit.     No Known Allergies  Past Medical History:  Diagnosis Date  . Bronchitis   . CHF (congestive heart failure) (HCC)    grade 1 diastolic with preserved EF in 4098  . COPD (chronic obstructive pulmonary disease) (HCC)   . DM type 2 with diabetic peripheral neuropathy (HCC)   . Hearing loss   . Hypercholesteremia   . Hypertension   . Peripheral neuropathy   . Sleep apnea    Severe sleep apnea requiring tracheostomy   Social History   Social History Narrative   Lives with roommate in a hotel.   In wheelchair - minimal walking   Social History   Tobacco Use  . Smoking status: Former Smoker    Packs/day: 2.00    Years: 30.00    Pack years: 60.00    Types: Cigarettes    Last attempt to quit: 07/02/1999    Years since quitting: 18.1  . Smokeless tobacco: Never Used  Substance Use Topics  . Alcohol use: No    Comment: heavy drinker, quit in 1983  . Drug use: No    Comment: used "everything but heroin, I had my  days"   family history includes Coronary artery disease in his father.     Objective:  BP 130/74   Pulse (!) 105   Temp 99.2 F (37.3 C) (Oral)   Resp 18   Ht 5\' 7"  (1.702 m)   Wt 255 lb (115.7 kg)   SpO2 96%   BMI 39.94 kg/m  Body mass index is 39.94 kg/m.  Wt Readings from Last 3 Encounters:  08/15/17 255 lb (115.7 kg)  08/14/17 254 lb (115.2 kg)  07/23/17 258 lb 6.4 oz (117.2 kg)    Physical Exam  Neurological:  Pt has strength 4/5 on upper and lower extremities.  FROM of extremities.      Assessment and Plan :  Peripheral vascular disease (HCC)  Essential hypertension  Congestive heart failure, unspecified HF chronicity, unspecified heart failure type (HCC)  Obstructive sleep apnea  [name] with frequent exacerbations  Chronic respiratory failure, unspecified whether with hypoxia or hypercapnia (HCC)  DM (diabetes mellitus), type 2 with neurological complications (HCC)  Diabetic polyneuropathy associated with type 2 diabetes mellitus (HCC)  Diabetic ulcer of other part of left foot associated with type 2 diabetes mellitus, unspecified ulcer stage (HCC)  Tracheostomy dependent (HCC)  SOB (shortness of breath)  Class 2 severe obesity due to excess calories with serious comorbidity and body mass index (BMI) of 39.0 to 39.9 in adult [facility])   Pt is in need of motorized joystick style wheelchair.  This will allow him to move with his multiple medical problems that do not allow him to walk and continue his current independent lifestyle.   Benny Lennert PA-C  Primary [facility] at [facility] 08/15/2017 2:23 PM

## 2017-08-15 NOTE — Patient Instructions (Addendum)
Get the lasix and the doxycyline for the swelling and the infection.  I will see you Thursday or Friday next week.  Change your dressing every other day.  Watch for higher than normal fever - watch for signs of infection and see me sooner if this occurs.    IF you received an x-ray today, you will receive an invoice from [facility]. Please contact [facility] at [phone] with questions or concerns regarding your invoice.   IF you received labwork today, you will receive an invoice from [facility]. Please contact [facility] at [phone] with questions or concerns regarding your invoice.   Our billing staff will not be able to assist you with questions regarding bills from these companies.  You will be contacted with the lab results as soon as they are available. The fastest way to get your results is to activate your My Chart account. Instructions are located on the last page of this paperwork. If you have not heard from us regarding the results in 2 weeks, please contact this office.

## 2017-08-15 NOTE — Progress Notes (Signed)
Tyrone Cunningham  MRN: [id number] DOB: September 28, 1955  PCP: Mancel Bale, PA-C  Chief Complaint  Patient presents with  . Hospitalization Follow-up    and needs chair paper     Subjective:  Pt presents to clinic for hospital follow-up.  He was admitted 6/4 and discharged 6/14 for cellulitis of left foot and leg and acute kidney injury. He had 10 days of IV antibiotics and has a Rx for Doxycycline waiting for him at the pharmacy.  He is planning on picking up this Rx today.  During his stay he developed acute on chronic heart failure due to volume overload and was treated with Lasix.  He will restart it on Monday at Lasix 20emq after his BMP is checked today.  He was on glucotrol and SSI insulin in the hospital - he does not wish to continue this at home as he does not see that insulin really helps him if he eats correctly.  They have set him up for home health to help with wound care.  He currently has someone to help with dressing changes.  Imagine studies DVT - neg for DVT Xray - no signs of osteomyelitis.  Labs - acute renal injury not much improvement while in the hospital that will continue to be monitored   Has appt with new pain management next week.  Sitting in wheelchair with legs hanging for most of the day.  History is obtained by patient.  Review of Systems  Constitutional: Negative for chills and fever.  Cardiovascular: Positive for leg swelling (normal for him).  Skin: Positive for wound (drsg in place he does not know how it looks).    Patient Active Problem List   Diagnosis Date Noted  . Cellulitis and abscess of left leg 08/04/2017  . Diabetic foot ulcer ([place]) 08/04/2017  . Acute renal failure (ARF) ([place]) 08/04/2017  . SOB (shortness of breath) 09/13/2016  . Peripheral vascular disease ([place]) 08/19/2016  . Tracheostomy care ([place]) 06/28/2015  . Obstructive sleep apnea 06/28/2015  . Chronic pain syndrome 04/18/2015  . Constipation 01/07/2014  . Constipation,  chronic 01/04/2014  . Chronic respiratory failure ([place]) 01/06/2013  . Edema 03/16/2012  . Obesity 10/20/2011  . DM (diabetes mellitus), type 2 with neurological complications ([place]) 14/38/8875  . Tracheostomy dependent ([place]) 10/16/2011  . [name] with frequent exacerbations   . CHF (congestive heart failure) ([place])   . Hypertension   . Hypercholesteremia   . Hearing loss   . Diabetic neuropathy ([place])     Current Outpatient Medications on File Prior to Visit  Medication Sig Dispense Refill  . albuterol (PROVENTIL) (2.5 MG/3ML) 0.083% nebulizer solution Take 3 mLs (2.5 mg total) by nebulization 4 (four) times daily. 1080 mL 3  . atorvastatin (LIPITOR) 20 MG tablet Take 1 tablet (20 mg total) by mouth daily. 30 tablet 0  . doxycycline (VIBRA-TABS) 100 MG tablet Take 1 tablet (100 mg total) by mouth 2 (two) times daily. 8 tablet 0  . [START ON 08/17/2017] furosemide (LASIX) 20 MG tablet Take 1 tablet (20 mg total) by mouth daily. 30 tablet 0  . glipiZIDE (GLUCOTROL) 5 MG tablet Take 1 tablet (5 mg total) by mouth 2 (two) times daily before a meal. 60 tablet 0  . Metoprolol Tartrate 75 MG TABS Take 75 mg by mouth 2 (two) times daily. 60 tablet 0  . OXYGEN Inhale 10 L into the lungs continuous.     No current facility-administered medications on file prior to  visit.     No Known Allergies  Past Medical History:  Diagnosis Date  . Bronchitis   . CHF (congestive heart failure) (HCC)    grade 1 diastolic with preserved EF in 2017  . COPD (chronic obstructive pulmonary disease) ([place])   . DM type 2 with diabetic peripheral neuropathy ([place])   . Hearing loss   . Hypercholesteremia   . Hypertension   . Peripheral neuropathy   . Sleep apnea    Severe sleep apnea requiring tracheostomy   Social History   Social History Narrative   Lives with roommate in a hotel.   In wheelchair - minimal walking   Social History   Tobacco Use  . Smoking status: Former Smoker    Packs/day: 2.00      Years: 30.00    Pack years: 60.00    Types: Cigarettes    Last attempt to quit: 07/02/1999    Years since quitting: 18.1  . Smokeless tobacco: Never Used  Substance Use Topics  . Alcohol use: No    Comment: heavy drinker, quit in 1983  . Drug use: No    Comment: used "everything but heroin, I had my days"   family history includes Coronary artery disease in his father.     Objective:  BP 130/74   Pulse (!) 105   Temp 99.2 F (37.3 C) (Oral)   Resp 18   Ht 5' 7"  (1.702 m)   Wt 255 lb (115.7 kg)   SpO2 96%   BMI 39.94 kg/m  Body mass index is 39.94 kg/m.  Wt Readings from Last 3 Encounters:  08/15/17 255 lb (115.7 kg)  08/14/17 254 lb (115.2 kg)  07/23/17 258 lb 6.4 oz (117.2 kg)    Physical Exam  Constitutional: He is oriented to person, place, and time. He appears well-developed and well-nourished.  HENT:  Head: Normocephalic and atraumatic.  Right Ear: External ear normal.  Left Ear: External ear normal.  Eyes: Conjunctivae are normal.  Neck: Normal range of motion.  Cardiovascular: Normal rate, regular rhythm and normal heart sounds.  No murmur heard. Pulmonary/Chest: Effort normal and breath sounds normal. He has no wheezes.  Neurological: He is alert and oriented to person, place, and time.  Skin: Skin is warm and dry.  Left leg wound - anterior distal shin - good granulation tissue without surrounding erythema 2+ pitting edema present up entire leg Minimal erythema of leg  multiple scabbed over lesion Very dry skin - flaking and peeling Bottom of left great toe an ulceration - pt has no sensation on bottom of foot - sensation is present on top if foot with pressure testing for edema  Good pulses  Psychiatric: Judgment normal.  Vitals reviewed.   Assessment and Plan :  Peripheral vascular disease ([place]) - will make healing of this wound more difficult - he will need close f/u  Essential hypertension - currently controlled - even without  medications  Congestive heart failure, unspecified HF chronicity, unspecified heart failure type ([place]) - pt has leg swelling - back on lasix - pt is not sure if he will take regularly - did encourage him to elevate feet unless he is out to help with swelling of LE  Obstructive sleep apnea  [name] with frequent exacerbations - continue home O2  Chronic respiratory failure, unspecified whether with hypoxia or hypercapnia (HCC)  DM (diabetes mellitus), type 2 with neurological complications ([place]) - pt declines insulin treatment - he knows the long term  complications of untreated diabetes - he will think about the glucotrol  Diabetic polyneuropathy associated with type 2 diabetes mellitus ([place])  Diabetic ulcer of other part of left foot associated with type 2 diabetes mellitus, unspecified ulcer stage ([place]) - needs to be monitor - encouraged to pt to keep clean with a sock on his foot  Tracheostomy dependent (HCC)  SOB (shortness of breath)  Class 2 severe obesity due to excess calories with serious comorbidity and body mass index (BMI) of 39.0 to 39.9 in adult [facility])  Chronic pain syndrome - Plan: traMADol (ULTRAM) 50 MG tablet - will refill - he has appt with a new pain clinic early next week  Financial difficulties  Living accommodation issues  Acute kidney injury ([place]) - Plan: CMP14+EGFR - check labs and monitor  Cellulitis of leg, left - Plan: CBC with Differential/Platelet - appears to be healing but d/w pt the important of continuing with his abx (he has had none in 24H) - d/w pt how his DM, PVD and other co morbidities will affect his recovery time - he it to keep the wounds covered on his leg  F/u in 5 days   Windell Hummingbird PA-C  [place] [facility] [place] 08/15/2017 11:38 PM

## 2017-08-15 NOTE — Progress Notes (Signed)
Documented in another visit.

## 2017-08-16 LAB — CMP14+EGFR
A/G RATIO: 0.9 — AB (ref 1.2–2.2)
ALT: 22 IU/L (ref 0–44)
AST: 13 IU/L (ref 0–40)
Albumin: 3.5 g/dL — ABNORMAL LOW (ref 3.6–4.8)
Alkaline Phosphatase: 170 IU/L — ABNORMAL HIGH (ref 39–117)
BUN / CREAT RATIO: 23 (ref 10–24)
BUN: 30 mg/dL — AB (ref 8–27)
CO2: 25 mmol/L (ref 20–29)
CREATININE: 1.28 mg/dL — AB (ref 0.76–1.27)
Calcium: 9.6 mg/dL (ref 8.6–10.2)
Chloride: 96 mmol/L (ref 96–106)
GFR, EST AFRICAN AMERICAN: 68 mL/min/{1.73_m2} (ref >59)
GFR, EST NON AFRICAN AMERICAN: 59 mL/min/{1.73_m2} — AB (ref >59)
Globulin, Total: 3.8 g/dL (ref 1.5–4.5)
Glucose: 246 mg/dL — ABNORMAL HIGH (ref 65–99)
Potassium: 4.8 mmol/L (ref 3.5–5.2)
SODIUM: 136 mmol/L (ref 134–144)
Total Protein: 7.3 g/dL (ref 6.0–8.5)

## 2017-08-16 LAB — CBC WITH DIFFERENTIAL/PLATELET
BASOS: 0 %
Basophils Absolute: 0 10*3/uL (ref 0.0–0.2)
EOS (ABSOLUTE): 0.1 10*3/uL (ref 0.0–0.4)
EOS: 1 %
HEMATOCRIT: 36.2 % — AB (ref 37.5–51.0)
HEMOGLOBIN: 12.1 g/dL — AB (ref 13.0–17.7)
Immature Grans (Abs): 0.2 10*3/uL — ABNORMAL HIGH (ref 0.0–0.1)
Immature Granulocytes: 2 %
LYMPHS ABS: 2.4 10*3/uL (ref 0.7–3.1)
Lymphs: 20 %
MCH: 29.3 pg (ref 26.6–33.0)
MCHC: 33.4 g/dL (ref 31.5–35.7)
MCV: 88 fL (ref 79–97)
MONOCYTES: 7 %
MONOS ABS: 0.8 10*3/uL (ref 0.1–0.9)
Neutrophils Absolute: 8.4 10*3/uL — ABNORMAL HIGH (ref 1.4–7.0)
Neutrophils: 70 %
Platelets: 426 10*3/uL (ref 150–450)
RBC: 4.13 x10E6/uL — ABNORMAL LOW (ref 4.14–5.80)
RDW: 14.2 % (ref 12.3–15.4)
WBC: 11.9 10*3/uL — ABNORMAL HIGH (ref 3.4–10.8)

## 2017-08-17 ENCOUNTER — Telehealth: Payer: Self-pay | Admitting: Physician Assistant

## 2017-08-17 ENCOUNTER — Other Ambulatory Visit: Payer: Self-pay | Admitting: Physician Assistant

## 2017-08-17 DIAGNOSIS — J449 Chronic obstructive pulmonary disease, unspecified: Secondary | ICD-10-CM

## 2017-08-17 NOTE — Telephone Encounter (Signed)
Copied from CRM [phone]#[id number]. Topic: Quick Communication - See Telephone Encounter >> Aug 17, 2017  7:44 AM Tyrone AmatoBurton, Donna F wrote: Pt states that he saw Maralyn SagoSarah on Saturday and is not sure about taking care of his wound and has questions   Best number [phone]

## 2017-08-17 NOTE — Telephone Encounter (Signed)
Patient was called informed on wound care and what to look for. Patient was also advised his paperwork for wheelchair has been successful sent

## 2017-08-19 NOTE — Telephone Encounter (Signed)
Victorino DikeJennifer with [employer] calling to see if the forms for a wheelchair for this pt was received that she sent back over on Monday for more signatures. CB#: [phone]

## 2017-08-19 NOTE — Telephone Encounter (Signed)
Faxed form for [facility] will start 08/20/2017  Additional form from [facility] and  Another form from [facility] [employer] put in KiesterSarah's box for signatures and faxing back.  Noted on form is time sensitive.

## 2017-08-20 NOTE — Telephone Encounter (Signed)
So I signed them both but it looks like he is trying to get a motorized chair fixed by [facility] and a new motorized wheelchair with [employer].  We need to call the patient and see which one he wants - he will likely not be able to get both with medicare and he should not need 2 wheelchairs.

## 2017-08-21 ENCOUNTER — Encounter: Payer: Self-pay | Admitting: Physician Assistant

## 2017-08-21 ENCOUNTER — Ambulatory Visit (INDEPENDENT_AMBULATORY_CARE_PROVIDER_SITE_OTHER): Payer: Medicare Other | Admitting: Physician Assistant

## 2017-08-21 VITALS — BP 142/82 | HR 120 | Temp 98.0°F | Resp 16

## 2017-08-21 DIAGNOSIS — E1149 Type 2 diabetes mellitus with other diabetic neurological complication: Secondary | ICD-10-CM

## 2017-08-21 DIAGNOSIS — J9610 Chronic respiratory failure, unspecified whether with hypoxia or hypercapnia: Secondary | ICD-10-CM

## 2017-08-21 DIAGNOSIS — L97529 Non-pressure chronic ulcer of other part of left foot with unspecified severity: Secondary | ICD-10-CM | POA: Diagnosis not present

## 2017-08-21 DIAGNOSIS — I739 Peripheral vascular disease, unspecified: Secondary | ICD-10-CM | POA: Diagnosis not present

## 2017-08-21 DIAGNOSIS — J449 Chronic obstructive pulmonary disease, unspecified: Secondary | ICD-10-CM

## 2017-08-21 DIAGNOSIS — E11621 Type 2 diabetes mellitus with foot ulcer: Secondary | ICD-10-CM | POA: Diagnosis not present

## 2017-08-21 DIAGNOSIS — R609 Edema, unspecified: Secondary | ICD-10-CM | POA: Diagnosis not present

## 2017-08-21 DIAGNOSIS — R0602 Shortness of breath: Secondary | ICD-10-CM | POA: Diagnosis not present

## 2017-08-21 DIAGNOSIS — I10 Essential (primary) hypertension: Secondary | ICD-10-CM

## 2017-08-21 DIAGNOSIS — N179 Acute kidney failure, unspecified: Secondary | ICD-10-CM | POA: Diagnosis not present

## 2017-08-21 LAB — POCT CBC
GRANULOCYTE PERCENT: 77.4 % (ref 37–80)
HCT, POC: 36.6 % — AB (ref 43.5–53.7)
Hemoglobin: 11.8 g/dL — AB (ref 14.1–18.1)
Lymph, poc: 1.6 (ref 0.6–3.4)
MCH: 28.1 pg (ref 27–31.2)
MCHC: 32.4 g/dL (ref 31.8–35.4)
MCV: 86.9 fL (ref 80–97)
MID (cbc): 0.6 (ref 0–0.9)
MPV: 8.3 fL (ref 0–99.8)
PLATELET COUNT, POC: 337 10*3/uL (ref 142–424)
POC Granulocyte: 7.7 — AB (ref 2–6.9)
POC LYMPH PERCENT: 16.5 %L (ref 10–50)
POC MID %: 6.1 %M (ref 0–12)
RBC: 4.21 M/uL — AB (ref 4.69–6.13)
RDW, POC: 14.4 %
WBC: 10 10*3/uL (ref 4.6–10.2)

## 2017-08-21 LAB — GLUCOSE, POCT (MANUAL RESULT ENTRY): POC Glucose: 245 mg/dl — AB (ref 70–99)

## 2017-08-21 MED ORDER — IPRATROPIUM BROMIDE 0.02 % IN SOLN
0.5000 mg | Freq: Once | RESPIRATORY_TRACT | Status: AC
  Administered 2017-08-21: 0.5 mg via RESPIRATORY_TRACT

## 2017-08-21 MED ORDER — ALBUTEROL SULFATE (2.5 MG/3ML) 0.083% IN NEBU
2.5000 mg | INHALATION_SOLUTION | Freq: Once | RESPIRATORY_TRACT | Status: AC
  Administered 2017-08-21: 2.5 mg via RESPIRATORY_TRACT

## 2017-08-21 NOTE — Patient Instructions (Signed)
     IF you received an x-ray today, you will receive an invoice from [facility]. Please contact [facility] at [phone] with questions or concerns regarding your invoice.   IF you received labwork today, you will receive an invoice from [facility]. Please contact [facility] at [phone] with questions or concerns regarding your invoice.   Our billing staff will not be able to assist you with questions regarding bills from these companies.  You will be contacted with the lab results as soon as they are available. The fastest way to get your results is to activate your My Chart account. Instructions are located on the last page of this paperwork. If you have not heard from us regarding the results in 2 weeks, please contact this office.     

## 2017-08-21 NOTE — Progress Notes (Signed)
Tyrone Cunningham  MRN: [id number] DOB: [birth year]  PCP: Mancel Bale, PA-C  Chief Complaint  Patient presents with  . Peripheral Vascular Disease    pt states he needs for follow-up from 6/15 to check on his legs     Subjective:  Pt presents to clinic for recheck of leg wound, foot ulcer, acute kidney problems, and medication check.  He just got his medications from the hospital yesterday though he has been out of the hospital for a week.  He has been taking a few of his pills but he does not know which one they are and he knows he has been mainly taking his pain medications.  Living situations at home is really hard.  In order to get here today he had to catch the bus which has caused a lot of breathing problems due to the manual wheelchair.  He feels really bad currently.  He started feeling a little dizzy this am and he is not sure why.  He has no CP.  He has not really been getting his leg cleaned and cared for like he thought that he would be able to do.  He is not sleeping well due to anxiety.  History is obtained by patient.  Review of Systems  Respiratory: Positive for shortness of breath (more than normal due to movement prior to appt).   Skin: Positive for wound.  Neurological: Positive for dizziness.    Patient Active Problem List   Diagnosis Date Noted  . Cellulitis and abscess of left leg 08/04/2017  . Diabetic foot ulcer ([place]) 08/04/2017  . Acute renal failure (ARF) ([place]) 08/04/2017  . SOB (shortness of breath) 09/13/2016  . Peripheral vascular disease ([place]) 08/19/2016  . Tracheostomy care ([place]) 06/28/2015  . Obstructive sleep apnea 06/28/2015  . Chronic pain syndrome 04/18/2015  . Constipation 01/07/2014  . Constipation, chronic 01/04/2014  . Chronic respiratory failure ([place]) 01/06/2013  . Edema 03/16/2012  . Obesity 10/20/2011  . DM (diabetes mellitus), type 2 with neurological complications ([place]) 56/43/3295  . 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 Gold D  Copd with frequent exacerbations   . CHF (congestive heart failure) ([place])   . Hypertension   . Hypercholesteremia   . Hearing loss   . Diabetic neuropathy ([place])     Current Outpatient Medications on File Prior to Visit  Medication Sig Dispense Refill  . albuterol (PROVENTIL) (2.5 MG/3ML) 0.083% nebulizer solution USE 1 VIAL IN NEBULIZER EVERY 6 HOURS. Generic: VENTOLIN 120 vial 10  . atorvastatin (LIPITOR) 20 MG tablet Take 1 tablet (20 mg total) by mouth daily. 30 tablet 0  . doxycycline (VIBRA-TABS) 100 MG tablet Take 1 tablet (100 mg total) by mouth 2 (two) times daily. 8 tablet 0  . furosemide (LASIX) 20 MG tablet Take 1 tablet (20 mg total) by mouth daily. 30 tablet 0  . glipiZIDE (GLUCOTROL) 5 MG tablet Take 1 tablet (5 mg total) by mouth 2 (two) times daily before a meal. 60 tablet 0  . Metoprolol Tartrate 75 MG TABS Take 75 mg by mouth 2 (two) times daily. 60 tablet 0  . OXYGEN Inhale 10 L into the lungs continuous.    . traMADol (ULTRAM) 50 MG tablet Take 2 tablets (100 mg total) by mouth every 6 (six) hours as needed for moderate pain. 120 tablet 0   No current facility-administered medications on file prior to visit.     No Known Allergies  Past Medical History:  Diagnosis Date  .  Bronchitis   . CHF (congestive heart failure) (HCC)    grade 1 diastolic with preserved EF in 2017  . COPD (chronic obstructive pulmonary disease) ([place])   . DM type 2 with diabetic peripheral neuropathy ([place])   . Hearing loss   . Hypercholesteremia   . Hypertension   . Peripheral neuropathy   . Sleep apnea    Severe sleep apnea requiring tracheostomy   Social History   Social History Narrative   Lives with roommate in a hotel.   In wheelchair - minimal walking   Social History   Tobacco Use  . Smoking status: Former Smoker    Packs/day: 2.00    Years: 30.00    Pack years: 60.00    Types: Cigarettes    Last attempt to quit: 07/02/1999    Years since quitting: 18.1  . Smokeless  tobacco: Never Used  Substance Use Topics  . Alcohol use: No    Comment: heavy drinker, quit in 1983  . Drug use: No    Comment: used "everything but heroin, I had my days"   family history includes Coronary artery disease in his father.     Objective:  BP (!) 142/82   Pulse (!) 120   Temp 98 F (36.7 C) (Oral)   Resp 16  There is no height or weight on file to calculate BMI.  Wt Readings from Last 3 Encounters:  08/15/17 255 lb (115.7 kg)  08/14/17 254 lb (115.2 kg)  07/23/17 258 lb 6.4 oz (117.2 kg)    Physical Exam  Constitutional: He is oriented to person, place, and time. He appears well-developed and well-nourished.  HENT:  Head: Normocephalic and atraumatic.  Right Ear: External ear normal.  Left Ear: External ear normal.  Eyes: Conjunctivae are normal.  Neck: Normal range of motion.  Pulmonary/Chest: He is in respiratory distress (slightly shorten sentences - better after nebulizer of albuterol and atrovent). He has wheezes.  Shortening of sentences improved after neb - pt is able to clear quite a bit of mucus from his trach canula after the nebulizer.  Musculoskeletal:  Bilateral edema is better today than it was 6 days ago at his last visit  Neurological: He is alert and oriented to person, place, and time.  Skin: Skin is warm. He is diaphoretic (sweaty likely from outside movement from the bus stop - gets better the longer he is in the office).  Peripheral vascular changes to right lower leg and feet Dry skin on left leg but all leg wounds are healed and there is no erythema present - swelling is in pattern of ace wrap that was removed -- the leg was washed and cleaned and then dressed with vaseline and lotion and ace wrap replaced (though he does not need to use any more).  Psychiatric: His behavior is normal. Judgment and thought content normal. His mood appears anxious (tearful at times esp when talking about his living situation).  Vitals reviewed.  Results  for orders placed or performed in visit on 08/21/17  CMP14+EGFR  Result Value Ref Range   Glucose 240 (H) 65 - 99 mg/dL   BUN 31 (H) 8 - 27 mg/dL   Creatinine, Ser 1.12 0.76 - 1.27 mg/dL   GFR calc non Af Amer 69 >59 mL/min/1.73   GFR calc Af Amer 80 >59 mL/min/1.73   BUN/Creatinine Ratio 28 (H) 10 - 24   Sodium 136 134 - 144 mmol/L   Potassium 4.6 3.5 - 5.2 mmol/L  Chloride 98 96 - 106 mmol/L   CO2 24 20 - 29 mmol/L   Calcium 9.2 8.6 - 10.2 mg/dL   Total Protein 7.3 6.0 - 8.5 g/dL   Albumin 3.5 (L) 3.6 - 4.8 g/dL   Globulin, Total 3.8 1.5 - 4.5 g/dL   Albumin/Globulin Ratio 0.9 (L) 1.2 - 2.2   Bilirubin Total <0.2 0.0 - 1.2 mg/dL   Alkaline Phosphatase 129 (H) 39 - 117 IU/L   AST 16 0 - 40 IU/L   ALT 20 0 - 44 IU/L  POCT glucose (manual entry)  Result Value Ref Range   POC Glucose 245 (A) 70 - 99 mg/dl  POCT CBC  Result Value Ref Range   WBC 10.0 4.6 - 10.2 K/uL   Lymph, poc 1.6 0.6 - 3.4   POC LYMPH PERCENT 16.5 10 - 50 %L   MID (cbc) 0.6 0 - 0.9   POC MID % 6.1 0 - 12 %M   POC Granulocyte 7.7 (A) 2 - 6.9   Granulocyte percent 77.4 37 - 80 %G   RBC 4.21 (A) 4.69 - 6.13 M/uL   Hemoglobin 11.8 (A) 14.1 - 18.1 g/dL   HCT, POC 36.6 (A) 43.5 - 53.7 %   MCV 86.9 80 - 97 fL   MCH, POC 28.1 27 - 31.2 pg   MCHC 32.4 31.8 - 35.4 g/dL   RDW, POC 14.4 %   Platelet Count, POC 337 142 - 424 K/uL   MPV 8.3 0 - 99.8 fL   Legs cleaned with soap and water and most of dry skin was removed with this.  Vaseline and lotion were placed on legs to help with dryness - pt states it felt much better. Assessment and Plan :  Essential hypertension  [name] with frequent exacerbations - not doing well today due to humidity and increase in physical exertion prior to appt - breathing was much better after mucus removed from trach canula and   Chronic respiratory failure, unspecified whether with hypoxia or hypercapnia (HCC)  Acute kidney injury ([place]) - Plan: CMP14+EGFR - check for  improvement  SOB (shortness of breath) - Plan: albuterol (PROVENTIL) (2.5 MG/3ML) 0.083% nebulizer solution 2.5 mg, ipratropium (ATROVENT) nebulizer solution 0.5 mg, POCT CBC - much better after neb - CBC normal - which is good considering in the hospital it was high due to cellulitis  Diabetic ulcer of other part of left foot associated with type 2 diabetes mellitus, unspecified ulcer stage ([place]) - Plan: CBC with Differential/Platelet, POCT glucose (manual entry)  Peripheral vascular disease (HCC) - wound on legs healed - no erythema or signs of remaining cellulitis - encouraged pt to find his doxy and finish it all  DM (diabetes mellitus), type 2 with neurological complications (HCC)  Edema, unspecified type - much better - still present - pt encouraged to elevated legs when possible and use lasix to help with swelling  Patient verbalized to me that they understand the following: diagnosis, what is being done for them, what to expect and what should be done at home.  Their questions have been answered.  See after visit summary for patient specific instructions.  Windell Hummingbird PA-C  [facility] 08/24/2017 2:22 PM  Please note: Portions of this report may have been transcribed using dragon voice recognition software. Every effort was made to ensure accuracy; however, inadvertent computerized transcription errors may be present.

## 2017-08-21 NOTE — Telephone Encounter (Signed)
Spoke with pt.  Pt wants wheelchair from [facility] [employer] [employer]. Form faxed yesterday, verified - forms in scan box for scanning into pt chart.

## 2017-08-22 LAB — CMP14+EGFR
ALBUMIN: 3.5 g/dL — AB (ref 3.6–4.8)
ALT: 20 IU/L (ref 0–44)
AST: 16 IU/L (ref 0–40)
Albumin/Globulin Ratio: 0.9 — ABNORMAL LOW (ref 1.2–2.2)
Alkaline Phosphatase: 129 IU/L — ABNORMAL HIGH (ref 39–117)
BUN / CREAT RATIO: 28 — AB (ref 10–24)
BUN: 31 mg/dL — ABNORMAL HIGH (ref 8–27)
Bilirubin Total: <0.2 mg/dL (ref 0.0–1.2)
CALCIUM: 9.2 mg/dL (ref 8.6–10.2)
CO2: 24 mmol/L (ref 20–29)
CREATININE: 1.12 mg/dL (ref 0.76–1.27)
Chloride: 98 mmol/L (ref 96–106)
GFR calc Af Amer: 80 mL/min/{1.73_m2} (ref >59)
GFR, EST NON AFRICAN AMERICAN: 69 mL/min/{1.73_m2} (ref >59)
GLOBULIN, TOTAL: 3.8 g/dL (ref 1.5–4.5)
GLUCOSE: 240 mg/dL — AB (ref 65–99)
Potassium: 4.6 mmol/L (ref 3.5–5.2)
SODIUM: 136 mmol/L (ref 134–144)
Total Protein: 7.3 g/dL (ref 6.0–8.5)

## 2017-08-24 ENCOUNTER — Encounter: Payer: Self-pay | Admitting: Physician Assistant

## 2017-08-24 ENCOUNTER — Telehealth: Payer: Self-pay | Admitting: Physician Assistant

## 2017-08-24 NOTE — Telephone Encounter (Signed)
All of that is fine with me - he could definitely use it but I am not sure he is going to be compliant that the patient is not interested in taking mediations.  Wound care for a DM foot ulcer on left great toe sounds like a wonderful idea for the patient though.

## 2017-08-24 NOTE — Telephone Encounter (Signed)
Please advise 

## 2017-08-24 NOTE — Telephone Encounter (Signed)
Copied from CRM [phone]. Topic: Quick Communication - See Telephone Encounter >> Aug 24, 2017 12:43 PM Luanna Coleawoud, Jessica L wrote: CRM for notification. See Telephone encounter for: 08/24/17. [facility]. Disease teaching 1 x 9. Lawanna Kobusngel also stated that they have a certified wound nurse and wanted to take over wound care for pt. Lawanna Kobusngel also stated that she would like pt to have medication management teaching. Please leave detailed message on her voicmail. Please Advise.

## 2017-08-25 ENCOUNTER — Ambulatory Visit: Payer: Medicare Other | Admitting: Physician Assistant

## 2017-08-25 NOTE — Telephone Encounter (Signed)
LMOVM for [facility] with verbal orders as requested.

## 2017-08-27 ENCOUNTER — Other Ambulatory Visit: Payer: Self-pay | Admitting: Physician Assistant

## 2017-08-27 DIAGNOSIS — J441 Chronic obstructive pulmonary disease with (acute) exacerbation: Secondary | ICD-10-CM

## 2017-08-27 MED ORDER — UMECLIDINIUM BROMIDE 62.5 MCG/INH IN AEPB: 1.0000 | INHALATION_SPRAY | Freq: Every day | RESPIRATORY_TRACT | 11 refills | Status: DC

## 2017-08-27 MED ORDER — ALBUTEROL SULFATE (2.5 MG/3ML) 0.083% IN NEBU: 2.5000 mg | INHALATION_SOLUTION | Freq: Four times a day (QID) | RESPIRATORY_TRACT | 1 refills | Status: DC | PRN

## 2017-08-27 NOTE — Telephone Encounter (Signed)
Message to Maralyn SagoSarah. Re: inhalers

## 2017-08-27 NOTE — Telephone Encounter (Signed)
[facility]

## 2017-08-27 NOTE — Telephone Encounter (Signed)
Pt said he use to take 2 inhalers.  He only has the albuterol and asking if needs another

## 2017-08-31 ENCOUNTER — Other Ambulatory Visit: Payer: Self-pay | Admitting: *Deleted

## 2017-08-31 ENCOUNTER — Other Ambulatory Visit: Payer: Self-pay | Admitting: Physician Assistant

## 2017-08-31 ENCOUNTER — Telehealth: Payer: Self-pay | Admitting: Physician Assistant

## 2017-08-31 DIAGNOSIS — G894 Chronic pain syndrome: Secondary | ICD-10-CM

## 2017-08-31 MED ORDER — UMECLIDINIUM BROMIDE 62.5 MCG/INH IN AEPB: 1.0000 | INHALATION_SPRAY | Freq: Every day | RESPIRATORY_TRACT | 11 refills | Status: DC

## 2017-08-31 NOTE — Telephone Encounter (Signed)
LOV 08/21/17 Tyrone LennertSarah Weber Last refill 08/15/17  # 120 with 0 refill

## 2017-08-31 NOTE — Telephone Encounter (Signed)
Call to patient- patient states he tried using mail order before- but it just didn't work. Pharmacy removed from list. Rx forwarded to local pharmacy per patient request.

## 2017-08-31 NOTE — Telephone Encounter (Signed)
Copied from CRM [phone]. Topic: Quick Communication - Rx Refill/Question >> Aug 31, 2017  9:42 AM Oneal GroutSebastian, Jennifer S wrote: Medication:traMADol Janean Sark(ULTRAM) 50 MG tablet   Has the patient contacted their pharmacy? Yes.   (Agent: If no, request that the patient contact the pharmacy for the refill.) (Agent: If yes, when and what did the pharmacy advise?)  Preferred Pharmacy (with phone number or street name): [facility] on [address]  Agent: Please be advised that RX refills may take up to 3 business days. We ask that you follow-up with your pharmacy.

## 2017-08-31 NOTE — Telephone Encounter (Unsigned)
Copied from CRM [phone]. Topic: Quick Communication - Rx Refill/Question >> Aug 31, 2017 10:09 AM Percival SpanishKennedy, Cheryl W wrote: Medication  umeclidinium bromide (INCRUSE ELLIPTA) 62.5 MCG/INH AEPB     Was sent to mail order and need it sent to local pharmacy    Has the patient contacted their pharmacy yes    Preferred Pharmacy [facility] [address]  Agent: Please be advised that RX refills may take up to 3 business days. We ask that you follow-up with your pharmacy.

## 2017-09-02 NOTE — Telephone Encounter (Signed)
Pt should be with pain clinic now.

## 2017-09-07 ENCOUNTER — Telehealth: Payer: Self-pay | Admitting: Physician Assistant

## 2017-09-07 NOTE — Telephone Encounter (Unsigned)
Copied from CRM [id number]. Topic: Quick Communication - Rx Refill/Question >> Sep 07, 2017  9:27 AM Tyrone Cunningham, Tyrone Cunningham wrote: Medication: albuterol (PROVENTIL) (2.5 MG/3ML) 0.083% nebulizer solution,traMADol (ULTRAM) 50 MG tablet  Has the patient contacted their pharmacy? Yes, patient would like this refill until he can fix his situation with pain management.  (Agent: If no, request that the patient contact the pharmacy for the refill.) (Agent: If yes, when and what did the pharmacy advise?)  Preferred Pharmacy (with phone number or street name): [facility] [phone] - [place], [place] - [address]  Agent: Please be advised that RX refills may take up to 3 business days. We ask that you follow-up with your pharmacy.

## 2017-09-07 NOTE — Telephone Encounter (Signed)
Pt states that he'll be out of the medication on Wednesday and trying to see if he will be able to get the Rx before the medication runs out, contact pt to advise

## 2017-09-09 ENCOUNTER — Other Ambulatory Visit: Payer: Self-pay

## 2017-09-09 ENCOUNTER — Ambulatory Visit (INDEPENDENT_AMBULATORY_CARE_PROVIDER_SITE_OTHER): Payer: Medicare Other | Admitting: Physician Assistant

## 2017-09-09 ENCOUNTER — Encounter: Payer: Self-pay | Admitting: Physician Assistant

## 2017-09-09 VITALS — BP 128/82 | HR 120 | Temp 99.6°F | Resp 18 | Ht 67.0 in | Wt 270.4 lb

## 2017-09-09 DIAGNOSIS — J449 Chronic obstructive pulmonary disease, unspecified: Secondary | ICD-10-CM | POA: Diagnosis not present

## 2017-09-09 DIAGNOSIS — I10 Essential (primary) hypertension: Secondary | ICD-10-CM

## 2017-09-09 DIAGNOSIS — M792 Neuralgia and neuritis, unspecified: Secondary | ICD-10-CM

## 2017-09-09 DIAGNOSIS — N179 Acute kidney failure, unspecified: Secondary | ICD-10-CM

## 2017-09-09 DIAGNOSIS — I110 Hypertensive heart disease with heart failure: Secondary | ICD-10-CM

## 2017-09-09 DIAGNOSIS — K5903 Drug induced constipation: Secondary | ICD-10-CM

## 2017-09-09 DIAGNOSIS — I509 Heart failure, unspecified: Secondary | ICD-10-CM

## 2017-09-09 DIAGNOSIS — L97529 Non-pressure chronic ulcer of other part of left foot with unspecified severity: Secondary | ICD-10-CM

## 2017-09-09 DIAGNOSIS — E11621 Type 2 diabetes mellitus with foot ulcer: Secondary | ICD-10-CM

## 2017-09-09 DIAGNOSIS — G894 Chronic pain syndrome: Secondary | ICD-10-CM | POA: Diagnosis not present

## 2017-09-09 DIAGNOSIS — E7800 Pure hypercholesterolemia, unspecified: Secondary | ICD-10-CM

## 2017-09-09 DIAGNOSIS — I739 Peripheral vascular disease, unspecified: Secondary | ICD-10-CM

## 2017-09-09 DIAGNOSIS — L03115 Cellulitis of right lower limb: Secondary | ICD-10-CM

## 2017-09-09 DIAGNOSIS — E1149 Type 2 diabetes mellitus with other diabetic neurological complication: Secondary | ICD-10-CM

## 2017-09-09 LAB — CBC WITH DIFFERENTIAL/PLATELET
BASOS ABS: 0.1 10*3/uL (ref 0.0–0.2)
BASOS: 0 %
EOS (ABSOLUTE): 0.3 10*3/uL (ref 0.0–0.4)
EOS: 2 %
HEMATOCRIT: 35.5 % — AB (ref 37.5–51.0)
HEMOGLOBIN: 11.8 g/dL — AB (ref 13.0–17.7)
IMMATURE GRANS (ABS): 0.4 10*3/uL — AB (ref 0.0–0.1)
Immature Granulocytes: 3 %
LYMPHS ABS: 2.6 10*3/uL (ref 0.7–3.1)
LYMPHS: 19 %
MCH: 29.1 pg (ref 26.6–33.0)
MCHC: 33.2 g/dL (ref 31.5–35.7)
MCV: 87 fL (ref 79–97)
MONOCYTES: 6 %
Monocytes Absolute: 0.8 10*3/uL (ref 0.1–0.9)
NEUTROS ABS: 9.7 10*3/uL — AB (ref 1.4–7.0)
Neutrophils: 70 %
Platelets: 257 10*3/uL (ref 150–450)
RBC: 4.06 x10E6/uL — ABNORMAL LOW (ref 4.14–5.80)
RDW: 14.6 % (ref 12.3–15.4)
WBC: 13.8 10*3/uL — ABNORMAL HIGH (ref 3.4–10.8)

## 2017-09-09 LAB — CMP14+EGFR
ALT: 12 IU/L (ref 0–44)
AST: 11 IU/L (ref 0–40)
Albumin/Globulin Ratio: 1.2 (ref 1.2–2.2)
Albumin: 3.4 g/dL — ABNORMAL LOW (ref 3.6–4.8)
Alkaline Phosphatase: 101 IU/L (ref 39–117)
BUN / CREAT RATIO: 20 (ref 10–24)
BUN: 18 mg/dL (ref 8–27)
CALCIUM: 9.5 mg/dL (ref 8.6–10.2)
CHLORIDE: 98 mmol/L (ref 96–106)
CO2: 24 mmol/L (ref 20–29)
Creatinine, Ser: 0.91 mg/dL (ref 0.76–1.27)
GFR, EST AFRICAN AMERICAN: 103 mL/min/{1.73_m2} (ref >59)
GFR, EST NON AFRICAN AMERICAN: 89 mL/min/{1.73_m2} (ref >59)
GLUCOSE: 186 mg/dL — AB (ref 65–99)
Globulin, Total: 2.9 g/dL (ref 1.5–4.5)
Potassium: 4.4 mmol/L (ref 3.5–5.2)
Sodium: 136 mmol/L (ref 134–144)
TOTAL PROTEIN: 6.3 g/dL (ref 6.0–8.5)

## 2017-09-09 LAB — HEMOGLOBIN A1C
Est. average glucose Bld gHb Est-mCnc: 177 mg/dL
HEMOGLOBIN A1C: 7.8 % — AB (ref 4.8–5.6)

## 2017-09-09 MED ORDER — TRAMADOL HCL 50 MG PO TABS: 100.0000 mg | ORAL_TABLET | Freq: Four times a day (QID) | ORAL | 0 refills | Status: AC

## 2017-09-09 MED ORDER — METOPROLOL TARTRATE 75 MG PO TABS: 75.0000 mg | ORAL_TABLET | Freq: Two times a day (BID) | ORAL | 4 refills | Status: DC

## 2017-09-09 MED ORDER — ALBUTEROL SULFATE (2.5 MG/3ML) 0.083% IN NEBU: 2.5000 mg | INHALATION_SOLUTION | Freq: Four times a day (QID) | RESPIRATORY_TRACT | 1 refills | Status: DC | PRN

## 2017-09-09 MED ORDER — POLYETHYLENE GLYCOL 3350 17 GM/SCOOP PO POWD: 17.0000 g | Freq: Every day | ORAL | 3 refills | Status: DC

## 2017-09-09 MED ORDER — GLIPIZIDE 5 MG PO TABS: 5.0000 mg | ORAL_TABLET | Freq: Two times a day (BID) | ORAL | 4 refills | Status: DC

## 2017-09-09 MED ORDER — ATORVASTATIN CALCIUM 20 MG PO TABS: 20.0000 mg | ORAL_TABLET | Freq: Every day | ORAL | 4 refills | Status: DC

## 2017-09-09 MED ORDER — UMECLIDINIUM BROMIDE 62.5 MCG/INH IN AEPB: 1.0000 | INHALATION_SPRAY | Freq: Every day | RESPIRATORY_TRACT | 4 refills | Status: DC

## 2017-09-09 MED ORDER — FUROSEMIDE 20 MG PO TABS: 20.0000 mg | ORAL_TABLET | Freq: Every day | ORAL | 0 refills | Status: DC

## 2017-09-09 MED ORDER — DOXYCYCLINE HYCLATE 100 MG PO TABS: 100.0000 mg | ORAL_TABLET | Freq: Two times a day (BID) | ORAL | 0 refills | Status: DC
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
## 2017-09-09 NOTE — Patient Instructions (Addendum)
  Take your medications Elevate your feet when you are at home Follow up with your pain management appt See you in a month   IF you received an x-ray today, you will receive an invoice from [facility]. Please contact [facility] at [phone] with questions or concerns regarding your invoice.   IF you received labwork today, you will receive an invoice from [facility]. Please contact [facility] at [phone] with questions or concerns regarding your invoice.   Our billing staff will not be able to assist you with questions regarding bills from these companies.  You will be contacted with the lab results as soon as they are available. The fastest way to get your results is to activate your My Chart account. Instructions are located on the last page of this paperwork. If you have not heard from us regarding the results in 2 weeks, please contact this office.

## 2017-09-10 ENCOUNTER — Encounter (HOSPITAL_BASED_OUTPATIENT_CLINIC_OR_DEPARTMENT_OTHER): Payer: Medicare Other

## 2017-09-10 MED ORDER — ALBUTEROL SULFATE HFA 108 (90 BASE) MCG/ACT IN AERS: 2.0000 | INHALATION_SPRAY | Freq: Four times a day (QID) | RESPIRATORY_TRACT | 0 refills | Status: DC | PRN

## 2017-09-10 NOTE — Telephone Encounter (Signed)
Pharmacy calling, did not receive, polyethylene glycol powder (GLYCOLAX/MIRALAX) powder, albuterol inhaler and tramdol. [facility]  Metoprolol 75mg  is on back on order, change to 25mg  or 50mg ?

## 2017-09-10 NOTE — Telephone Encounter (Signed)
Please see note below. 

## 2017-09-10 NOTE — Telephone Encounter (Signed)
Ultram and miralax was sent to local pharmacy per patient request.  I sent in a neb but has sent inhaler also for pt

## 2017-09-14 ENCOUNTER — Telehealth: Payer: Self-pay | Admitting: Physician Assistant

## 2017-09-14 DIAGNOSIS — I10 Essential (primary) hypertension: Secondary | ICD-10-CM

## 2017-09-14 NOTE — Telephone Encounter (Signed)
Tramadol refill. Tyrone Cunningham with [facility] calling on behalf of pt to have medications refilled. [facility] [address] [facility]180.  [facility] also stating that Metoprolol 75mg  tab is currently on back order. Requesting a replacement for Metoprolol 75mg  or for a different dosage of the medication to be sent to the pharmacy.   LOV: 09/09/17 Lilia ArgueSarah Weber,PA  [facility]

## 2017-09-14 NOTE — Telephone Encounter (Signed)
Copied from CRM [id number]. Topic: Inquiry >> Sep 14, 2017  3:43 PM Yvonna Alanisobinson, Andra M wrote: Reason for CRM: Chales AbrahamsMary Ann with [facility] [phone]) called requesting two refills on behalf of the patient. Requested is TraMADol (ULTRAM) 50 MG tablet. Also, Metoprolol Tartrate 75 MG TABS has been requested, but it is on back order. Chales AbrahamsMary Ann has requested a replacement or a different doseage for Metoprolol Tartrate 75 MG TABS. [facility] [address] [place] - [id number] [address] [phone] (Phone) [phone] (Fax).       Thank You!!!

## 2017-09-14 NOTE — Telephone Encounter (Signed)
Pt message re: refills sent to Maralyn SagoSarah

## 2017-09-15 MED ORDER — METOPROLOL TARTRATE 50 MG PO TABS: 75.0000 mg | ORAL_TABLET | Freq: Two times a day (BID) | ORAL | 3 refills | Status: DC

## 2017-09-15 NOTE — Telephone Encounter (Signed)
Please call [facility] and see if he got the ultram from my understanding is that he was going over there after his appt for his ultram.  That should be gotten from a local pharmacy as he is getting in with pain management this month.  I have sent a replacement for the metoprolol.

## 2017-09-15 NOTE — Telephone Encounter (Signed)
[facility] pt did pick up prescription for Tramadol on 7/11 #180.

## 2017-09-18 ENCOUNTER — Telehealth: Payer: Self-pay | Admitting: Physician Assistant

## 2017-09-18 NOTE — Telephone Encounter (Signed)
Copied from CRM [id number]. Topic: Quick Communication - See Telephone Encounter >> Sep 18, 2017 11:57 AM Raquel SarnaHayes, Teresa G wrote: Pt is needing a paper for a new social security card.  The note needs to say he is a pt at [facility], PCP name and last bill. Pt wants to come by on Sat to pick up.  Please call pt if there are any questions.

## 2017-09-23 ENCOUNTER — Encounter: Payer: Self-pay | Admitting: *Deleted

## 2017-09-23 ENCOUNTER — Telehealth: Payer: Self-pay | Admitting: Physician Assistant

## 2017-09-23 NOTE — Telephone Encounter (Signed)
Copied from CRM [phone]. Topic: General - Other >> Sep 23, 2017 11:43 AM Marylen PontoMcneil, Ja-Kwan wrote: Reason for CRM:  Kaitlyn with [facility] called to verify that fax sent on 09/22/17 was received. Cb# [phone]

## 2017-09-23 NOTE — Telephone Encounter (Signed)
Unable to leave message voice mail full.  Letter and most recent itemized bill is at [address].

## 2017-09-23 NOTE — Telephone Encounter (Signed)
Patient called back and was given the message.  He said he would pick the paperwork up tomorrow morning, 09/24/17

## 2017-09-24 NOTE — Telephone Encounter (Signed)
Unable to leave message voice mail is full  Can we please verify what pharmacy this patient is using.

## 2017-09-30 ENCOUNTER — Ambulatory Visit: Payer: Medicare Other | Admitting: Physician Assistant

## 2017-10-07 ENCOUNTER — Ambulatory Visit (HOSPITAL_BASED_OUTPATIENT_CLINIC_OR_DEPARTMENT_OTHER): Payer: Medicare Other

## 2017-10-09 ENCOUNTER — Emergency Department (HOSPITAL_COMMUNITY): Payer: Medicare Other

## 2017-10-09 ENCOUNTER — Inpatient Hospital Stay (HOSPITAL_COMMUNITY)
Admission: EM | Admit: 2017-10-09 | Discharge: 2017-10-15 | DRG: 871 | Disposition: A | Discharge status: Home Health | Payer: Medicare Other | Attending: Family Medicine | Admitting: Family Medicine

## 2017-10-09 ENCOUNTER — Encounter (HOSPITAL_COMMUNITY): Payer: Self-pay | Admitting: Emergency Medicine

## 2017-10-09 DIAGNOSIS — J189 Pneumonia, unspecified organism: Secondary | ICD-10-CM | POA: Diagnosis present

## 2017-10-09 DIAGNOSIS — E1142 Type 2 diabetes mellitus with diabetic polyneuropathy: Secondary | ICD-10-CM | POA: Diagnosis present

## 2017-10-09 DIAGNOSIS — I110 Hypertensive heart disease with heart failure: Secondary | ICD-10-CM | POA: Diagnosis present

## 2017-10-09 DIAGNOSIS — E7800 Pure hypercholesterolemia, unspecified: Secondary | ICD-10-CM | POA: Diagnosis present

## 2017-10-09 DIAGNOSIS — E876 Hypokalemia: Secondary | ICD-10-CM | POA: Diagnosis present

## 2017-10-09 DIAGNOSIS — E1140 Type 2 diabetes mellitus with diabetic neuropathy, unspecified: Secondary | ICD-10-CM | POA: Diagnosis present

## 2017-10-09 DIAGNOSIS — J9621 Acute and chronic respiratory failure with hypoxia: Secondary | ICD-10-CM | POA: Diagnosis present

## 2017-10-09 DIAGNOSIS — E1149 Type 2 diabetes mellitus with other diabetic neurological complication: Secondary | ICD-10-CM | POA: Diagnosis not present

## 2017-10-09 DIAGNOSIS — J449 Chronic obstructive pulmonary disease, unspecified: Secondary | ICD-10-CM | POA: Diagnosis not present

## 2017-10-09 DIAGNOSIS — Z9049 Acquired absence of other specified parts of digestive tract: Secondary | ICD-10-CM | POA: Diagnosis not present

## 2017-10-09 DIAGNOSIS — R509 Fever, unspecified: Secondary | ICD-10-CM

## 2017-10-09 DIAGNOSIS — J4489 Other specified chronic obstructive pulmonary disease: Secondary | ICD-10-CM | POA: Diagnosis present

## 2017-10-09 DIAGNOSIS — Y95 Nosocomial condition: Secondary | ICD-10-CM | POA: Diagnosis present

## 2017-10-09 DIAGNOSIS — G894 Chronic pain syndrome: Secondary | ICD-10-CM | POA: Diagnosis present

## 2017-10-09 DIAGNOSIS — J440 Chronic obstructive pulmonary disease with acute lower respiratory infection: Secondary | ICD-10-CM | POA: Diagnosis present

## 2017-10-09 DIAGNOSIS — Z930 Tracheostomy status: Secondary | ICD-10-CM

## 2017-10-09 DIAGNOSIS — R945 Abnormal results of liver function studies: Secondary | ICD-10-CM

## 2017-10-09 DIAGNOSIS — Z7984 Long term (current) use of oral hypoglycemic drugs: Secondary | ICD-10-CM

## 2017-10-09 DIAGNOSIS — Z87891 Personal history of nicotine dependence: Secondary | ICD-10-CM | POA: Diagnosis not present

## 2017-10-09 DIAGNOSIS — Z8249 Family history of ischemic heart disease and other diseases of the circulatory system: Secondary | ICD-10-CM | POA: Diagnosis not present

## 2017-10-09 DIAGNOSIS — Z9981 Dependence on supplemental oxygen: Secondary | ICD-10-CM | POA: Diagnosis not present

## 2017-10-09 DIAGNOSIS — E1151 Type 2 diabetes mellitus with diabetic peripheral angiopathy without gangrene: Secondary | ICD-10-CM | POA: Diagnosis present

## 2017-10-09 DIAGNOSIS — H9190 Unspecified hearing loss, unspecified ear: Secondary | ICD-10-CM | POA: Diagnosis present

## 2017-10-09 DIAGNOSIS — E785 Hyperlipidemia, unspecified: Secondary | ICD-10-CM | POA: Diagnosis present

## 2017-10-09 DIAGNOSIS — Z6841 Body Mass Index (BMI) 40.0 and over, adult: Secondary | ICD-10-CM | POA: Diagnosis not present

## 2017-10-09 DIAGNOSIS — G4733 Obstructive sleep apnea (adult) (pediatric): Secondary | ICD-10-CM | POA: Diagnosis present

## 2017-10-09 DIAGNOSIS — A419 Sepsis, unspecified organism: Principal | ICD-10-CM | POA: Diagnosis present

## 2017-10-09 DIAGNOSIS — E43 Unspecified severe protein-calorie malnutrition: Secondary | ICD-10-CM

## 2017-10-09 DIAGNOSIS — I5032 Chronic diastolic (congestive) heart failure: Secondary | ICD-10-CM | POA: Diagnosis present

## 2017-10-09 LAB — CBC WITH DIFFERENTIAL/PLATELET
ABS IMMATURE GRANULOCYTES: 0.3 10*3/uL — AB (ref 0.0–0.1)
Basophils Absolute: 0.1 10*3/uL (ref 0.0–0.1)
Basophils Relative: 1 %
Eosinophils Absolute: 0 10*3/uL (ref 0.0–0.7)
Eosinophils Relative: 0 %
HEMATOCRIT: 37.7 % — AB (ref 39.0–52.0)
Hemoglobin: 11.9 g/dL — ABNORMAL LOW (ref 13.0–17.0)
IMMATURE GRANULOCYTES: 2 %
Lymphocytes Relative: 9 %
Lymphs Abs: 2 10*3/uL (ref 0.7–4.0)
MCH: 28.2 pg (ref 26.0–34.0)
MCHC: 31.6 g/dL (ref 30.0–36.0)
MCV: 89.3 fL (ref 78.0–100.0)
MONOS PCT: 8 %
Monocytes Absolute: 1.9 10*3/uL — ABNORMAL HIGH (ref 0.1–1.0)
NEUTROS ABS: 18.2 10*3/uL — AB (ref 1.7–7.7)
NEUTROS PCT: 80 %
Platelets: 316 10*3/uL (ref 150–400)
RBC: 4.22 MIL/uL (ref 4.22–5.81)
RDW: 13.2 % (ref 11.5–15.5)
WBC: 22.6 10*3/uL — ABNORMAL HIGH (ref 4.0–10.5)

## 2017-10-09 LAB — I-STAT CG4 LACTIC ACID, ED: Lactic Acid, Venous: 2.13 mmol/L (ref 0.5–1.9)

## 2017-10-09 LAB — COMPREHENSIVE METABOLIC PANEL
ALT: 28 U/L (ref 0–44)
AST: 23 U/L (ref 15–41)
Albumin: 2.3 g/dL — ABNORMAL LOW (ref 3.5–5.0)
Alkaline Phosphatase: 132 U/L — ABNORMAL HIGH (ref 38–126)
Anion gap: 12 (ref 5–15)
BUN: 14 mg/dL (ref 8–23)
CO2: 27 mmol/L (ref 22–32)
CREATININE: 1.11 mg/dL (ref 0.61–1.24)
Calcium: 8.8 mg/dL — ABNORMAL LOW (ref 8.9–10.3)
Chloride: 99 mmol/L (ref 98–111)
GFR calc non Af Amer: >60 mL/min (ref >60)
Glucose, Bld: 124 mg/dL — ABNORMAL HIGH (ref 70–99)
Potassium: 3.2 mmol/L — ABNORMAL LOW (ref 3.5–5.1)
SODIUM: 138 mmol/L (ref 135–145)
Total Bilirubin: 0.6 mg/dL (ref 0.3–1.2)
Total Protein: 7.1 g/dL (ref 6.5–8.1)

## 2017-10-09 LAB — LACTIC ACID, PLASMA: Lactic Acid, Venous: 0.9 mmol/L (ref 0.5–1.9)

## 2017-10-09 LAB — GLUCOSE, CAPILLARY: Glucose-Capillary: 74 mg/dL (ref 70–99)

## 2017-10-09 MED ORDER — UMECLIDINIUM BROMIDE 62.5 MCG/INH IN AEPB
1.0000 | INHALATION_SPRAY | Freq: Every day | RESPIRATORY_TRACT | Status: DC
  Administered 2017-10-09 – 2017-10-15 (×6): 1 via RESPIRATORY_TRACT
  Filled 2017-10-09: qty 7

## 2017-10-09 MED ORDER — ALBUTEROL SULFATE (2.5 MG/3ML) 0.083% IN NEBU
2.5000 mg | INHALATION_SOLUTION | Freq: Four times a day (QID) | RESPIRATORY_TRACT | Status: DC | PRN
  Administered 2017-10-09 – 2017-10-14 (×8): 2.5 mg via RESPIRATORY_TRACT
  Filled 2017-10-09 (×8): qty 3

## 2017-10-09 MED ORDER — SODIUM CHLORIDE 0.9% FLUSH
3.0000 mL | Freq: Two times a day (BID) | INTRAVENOUS | Status: DC
  Administered 2017-10-09 – 2017-10-15 (×10): 3 mL via INTRAVENOUS

## 2017-10-09 MED ORDER — POLYETHYLENE GLYCOL 3350 17 G PO PACK
17.0000 g | PACK | Freq: Every day | ORAL | Status: DC
  Administered 2017-10-09 – 2017-10-15 (×5): 17 g via ORAL
  Filled 2017-10-09 (×6): qty 1

## 2017-10-09 MED ORDER — INSULIN ASPART 100 UNIT/ML ~~LOC~~ SOLN
0.0000 [IU] | Freq: Every day | SUBCUTANEOUS | Status: DC
  Administered 2017-10-12: 2 [IU] via SUBCUTANEOUS

## 2017-10-09 MED ORDER — SODIUM CHLORIDE 0.9 % IV SOLN
250.0000 mL | INTRAVENOUS | Status: DC | PRN
  Administered 2017-10-09: 250 mL via INTRAVENOUS

## 2017-10-09 MED ORDER — FUROSEMIDE 20 MG PO TABS
20.0000 mg | ORAL_TABLET | Freq: Every day | ORAL | Status: DC
  Administered 2017-10-09 – 2017-10-15 (×7): 20 mg via ORAL
  Filled 2017-10-09 (×9): qty 1

## 2017-10-09 MED ORDER — LACTATED RINGERS IV BOLUS
1000.0000 mL | Freq: Once | INTRAVENOUS | Status: AC
  Administered 2017-10-09: 1000 mL via INTRAVENOUS

## 2017-10-09 MED ORDER — GLIPIZIDE 5 MG PO TABS
5.0000 mg | ORAL_TABLET | Freq: Two times a day (BID) | ORAL | Status: DC
  Administered 2017-10-10 (×2): 5 mg via ORAL
  Filled 2017-10-09 (×2): qty 1

## 2017-10-09 MED ORDER — ATORVASTATIN CALCIUM 20 MG PO TABS
20.0000 mg | ORAL_TABLET | Freq: Every day | ORAL | Status: DC
  Administered 2017-10-09 – 2017-10-15 (×7): 20 mg via ORAL
  Filled 2017-10-09 (×7): qty 1

## 2017-10-09 MED ORDER — INSULIN ASPART 100 UNIT/ML ~~LOC~~ SOLN
0.0000 [IU] | Freq: Three times a day (TID) | SUBCUTANEOUS | Status: DC
Start: 2017-10-10 — End: 2017-10-15
  Administered 2017-10-10 – 2017-10-11 (×3): 2 [IU] via SUBCUTANEOUS
  Administered 2017-10-11: 3 [IU] via SUBCUTANEOUS
  Administered 2017-10-11: 1 [IU] via SUBCUTANEOUS
  Administered 2017-10-12: 2 [IU] via SUBCUTANEOUS
  Administered 2017-10-12: 1 [IU] via SUBCUTANEOUS
  Administered 2017-10-13: 3 [IU] via SUBCUTANEOUS
  Administered 2017-10-13 – 2017-10-14 (×3): 2 [IU] via SUBCUTANEOUS
  Administered 2017-10-14 – 2017-10-15 (×2): 1 [IU] via SUBCUTANEOUS
  Administered 2017-10-15: 2 [IU] via SUBCUTANEOUS

## 2017-10-09 MED ORDER — METOPROLOL TARTRATE 50 MG PO TABS
75.0000 mg | ORAL_TABLET | Freq: Two times a day (BID) | ORAL | Status: DC
  Administered 2017-10-10 – 2017-10-15 (×11): 75 mg via ORAL
  Filled 2017-10-09 (×11): qty 1

## 2017-10-09 MED ORDER — SODIUM CHLORIDE 0.9 % IV SOLN
1.0000 g | Freq: Three times a day (TID) | INTRAVENOUS | Status: DC
  Administered 2017-10-10 – 2017-10-12 (×7): 1 g via INTRAVENOUS
  Filled 2017-10-09 (×8): qty 1

## 2017-10-09 MED ORDER — SODIUM CHLORIDE 0.9 % IV SOLN
2.0000 g | Freq: Once | INTRAVENOUS | Status: AC
  Administered 2017-10-09: 2 g via INTRAVENOUS
  Filled 2017-10-09: qty 2

## 2017-10-09 MED ORDER — ENOXAPARIN SODIUM 40 MG/0.4ML ~~LOC~~ SOLN
40.0000 mg | SUBCUTANEOUS | Status: DC
  Administered 2017-10-09 – 2017-10-14 (×6): 40 mg via SUBCUTANEOUS
  Filled 2017-10-09 (×6): qty 0.4

## 2017-10-09 MED ORDER — VANCOMYCIN HCL 10 G IV SOLR
2500.0000 mg | Freq: Once | INTRAVENOUS | Status: AC
  Administered 2017-10-09: 2500 mg via INTRAVENOUS
  Filled 2017-10-09: qty 2500

## 2017-10-09 MED ORDER — VANCOMYCIN HCL IN DEXTROSE 1-5 GM/200ML-% IV SOLN
1000.0000 mg | Freq: Two times a day (BID) | INTRAVENOUS | Status: DC
  Administered 2017-10-10 – 2017-10-11 (×3): 1000 mg via INTRAVENOUS
  Filled 2017-10-09 (×4): qty 200

## 2017-10-09 MED ORDER — VANCOMYCIN HCL IN DEXTROSE 1-5 GM/200ML-% IV SOLN: 1000.0000 mg | Freq: Once | INTRAVENOUS | Status: DC

## 2017-10-09 MED ORDER — SODIUM CHLORIDE 0.9% FLUSH: 3.0000 mL | INTRAVENOUS | Status: DC | PRN

## 2017-10-09 MED ORDER — POLYETHYLENE GLYCOL 3350 17 GM/SCOOP PO POWD
17.0000 g | Freq: Every day | ORAL | Status: DC
  Filled 2017-10-09: qty 255

## 2017-10-09 NOTE — Progress Notes (Signed)
Patient came from home with SOB, due to large amount of thick tan-yellow-green secretions suctioned from trach, placed on 40% ATC, patient SOB improved.

## 2017-10-09 NOTE — ED Notes (Signed)
CG-4 of 2.13 reported to Dr. Lynford Humphreyickens

## 2017-10-09 NOTE — Progress Notes (Signed)
Pharmacy Antibiotic Note  Tyrone BaileyMark Cunningham is a 62 y.o. male with chronic tracheostomy admitted on 10/09/2017 with pneumonia.  Pharmacy has been consulted for vancomycin and cefepime dosing.  Plan: Vancomycin 2500 mg IV x1, then 1000 mg IV every 12 hours.  Goal trough 15-20 mcg/mL. Cefepime 2g IV x1, then 1g IV q8h Monitor SCr, clinical improvement      Temp (24hrs), Avg:100 F (37.8 C), Min:100 F (37.8 C), Max:100 F (37.8 C)  Recent Labs  Lab 10/09/17 1719 10/09/17 1734  WBC  --  22.6*  CREATININE  --  1.11  LATICACIDVEN 2.13*  --     CrCl cannot be calculated (Unknown ideal weight.).    No Known Allergies  Antimicrobials this admission: Vancomycin 8/9 >>  Cefepime 8/9 >>   Dose adjustments this admission: N/A  Microbiology results: 8/9 BCx:   Thank you for allowing pharmacy to be a part of this patient's care.  Arvilla MarketMelissa Slyvia Lartigue, PharmD PGY1 Pharmacy Resident Phone [phone] 10/09/2017     6:53 PM

## 2017-10-09 NOTE — ED Notes (Signed)
No addl blood draw,  Pt enroute to inpatieint floor.

## 2017-10-09 NOTE — ED Provider Notes (Signed)
Tyrone Cunningham [facility] EMERGENCY DEPARTMENT Provider Note   CSN: [id number] Arrival date & time: 10/09/17  1619   History   Chief Complaint Chief Complaint  Patient presents with  . Shortness of Breath    HPI [name] Servellon is a 62 y.o. male.  HPI 62 year old male with history of CHF, COPD, tracheostomy dependence for OSA, hypertension, hyperlipidemia presents to the emergency department for evaluation of cough, fever, shortness of breath.  Reports symptoms have been present for the past 3 days.  Has had increased mucus production around eyes and nose as well.  Lives in an extended living motel with a roommate who is also had similar symptoms over the past couple of days.  No nausea vomiting or diarrhea.  No rash.  Increased mucus production.  Was admitted about 2 months ago for left lower extremely cellulitis and diabetic foot ulcer and discharged on prolonged course of antibiotics which he states he completed.  Denies any chest pain in the emergency department. Does have O2 at home which he typically only requires at night. Does not require ventilator.   Past Medical History:  Diagnosis Date  . Bronchitis   . CHF (congestive heart failure) (HCC)    grade 1 diastolic with preserved EF in [id number]  . COPD (chronic obstructive pulmonary disease) (HCC)   . DM type 2 with diabetic peripheral neuropathy (HCC)   . Hearing loss   . Hypercholesteremia   . Hypertension   . Peripheral neuropathy   . Sleep apnea    Severe sleep apnea requiring tracheostomy    Patient Active Problem List   Diagnosis Date Noted  . HCAP (healthcare-associated pneumonia) 10/09/2017  . Hypokalemia 10/09/2017  . Abnormal liver function 10/09/2017  . Severe protein-calorie malnutrition (HCC) 10/09/2017  . Cellulitis and abscess of left leg 08/04/2017  . Diabetic foot ulcer (HCC) 08/04/2017  . Acute renal failure (ARF) (HCC) 08/04/2017  . SOB (shortness of breath) 09/13/2016  . Peripheral vascular disease  (HCC) 08/19/2016  . Tracheostomy care (HCC) 06/28/2015  . Obstructive sleep apnea 06/28/2015  . Chronic pain syndrome 04/18/2015  . Constipation 01/07/2014  . Constipation, chronic 01/04/2014  . Chronic respiratory failure (HCC) 01/06/2013  . Edema 03/16/2012  . Obesity 10/20/2011  . 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 [name] with frequent exacerbations   . CHF (congestive heart failure) (HCC)   . Hypertension   . Hypercholesteremia   . Hearing loss   . Diabetic neuropathy [facility](HCC)     Past Surgical History:  Procedure Laterality Date  . APPENDECTOMY    . CHOLECYSTECTOMY N/A 01/05/2014   Procedure: LAPAROSCOPIC CHOLECYSTECTOMY;  Surgeon: Emelia LoronMatthew Wakefield, MD;  Location: [facility] ORS;  Service: General;  Laterality: N/A;  . FEMUR HARDWARE REMOVAL  09/01/2003   Removal of retained hardware, two interlocking distal femoral  . FEMUR SURGERY    . HAND SURGERY    . MULTIPLE EXTRACTIONS WITH ALVEOLOPLASTY N/A 05/11/2012   Procedure: MULTIPLE EXTRACTION WITH ALVEOLOPLASTY;  Surgeon: Georgia LopesScott M Jensen, DDS;  Location: [facility] OR;  Service: Oral Surgery;  Laterality: N/A;  . TRACHEOSTOMY          Home Medications    Prior to Admission medications   Medication Sig Start Date End Date Taking? Authorizing Provider  oxyCODONE-acetaminophen (PERCOCET) 10-325 MG tablet Take 1 tablet by mouth every 6 (six) hours as needed for pain.    Yes [provider]  OXYGEN Inhale 10  L into the lungs continuous.   Yes [provider]  traMADol (ULTRAM) 50 MG tablet Take 50-100 mg by mouth 3 (three) times daily as needed for moderate pain.   Yes [provider]  albuterol (PROVENTIL HFA;VENTOLIN HFA) 108 (90 Base) MCG/ACT inhaler Inhale 2 puffs into the lungs every 6 (six) hours as needed for wheezing or shortness of breath. 09/10/17   Weber, Dema Severin, PA-C  albuterol (PROVENTIL) (2.5 MG/3ML) 0.083%  nebulizer solution Take 3 mLs (2.5 mg total) by nebulization every 6 (six) hours as needed for wheezing or shortness of breath. 09/09/17   Weber, Dema Severin, PA-C  atorvastatin (LIPITOR) 20 MG tablet Take 1 tablet (20 mg total) by mouth daily. 09/09/17   Weber, Dema Severin, PA-C  doxycycline (VIBRA-TABS) 100 MG tablet Take 1 tablet (100 mg total) by mouth 2 (two) times daily. 09/09/17   Weber, Dema Severin, PA-C  furosemide (LASIX) 20 MG tablet Take 1 tablet (20 mg total) by mouth daily. 09/09/17 10/09/17  Valarie Cones, Dema Severin, PA-C  glipiZIDE (GLUCOTROL) 5 MG tablet Take 1 tablet (5 mg total) by mouth 2 (two) times daily before a meal. 09/09/17   Weber, Dema Severin, PA-C  metoprolol tartrate (LOPRESSOR) 50 MG tablet Take 1.5 tablets (75 mg total) by mouth 2 (two) times daily. 09/15/17   Weber, Dema Severin, PA-C  Metoprolol Tartrate 75 MG TABS Take 75 mg by mouth 2 (two) times daily. 09/09/17   Weber, Dema Severin, PA-C  polyethylene glycol powder (GLYCOLAX/MIRALAX) powder Take 17 g by mouth daily. 09/09/17   Weber, Dema Severin, PA-C  umeclidinium bromide (INCRUSE ELLIPTA) 62.5 MCG/INH AEPB Inhale 1 puff into the lungs daily. 09/09/17   Valarie Cones, Dema Severin, PA-C    Family History Family History  Problem Relation Age of Onset  . Coronary artery disease Father     Social History Social History   Tobacco Use  . Smoking status: Former Smoker    Packs/day: 2.00    Years: 30.00    Pack years: 60.00    Types: Cigarettes    Last attempt to quit: 07/02/1999    Years since quitting: 18.2  . Smokeless tobacco: Never Used  Substance Use Topics  . Alcohol use: No    Comment: heavy drinker, quit in 1983  . Drug use: No    Comment: used "everything but heroin, I had my days"     Allergies   Patient has no known allergies.   Review of Systems Review of Systems  Constitutional: Positive for fatigue and fever. Negative for chills.  HENT: Positive for congestion and rhinorrhea.   Eyes: Positive for discharge (bilat).  Respiratory: Positive  for cough and shortness of breath.   Cardiovascular: Negative for chest pain and leg swelling.  Gastrointestinal: Negative for abdominal pain, diarrhea, nausea and vomiting.  Genitourinary: Negative for dysuria and hematuria.  Musculoskeletal: Negative for back pain and neck pain.  Skin: Negative for rash.  Neurological: Negative for weakness, numbness and headaches.    Physical Exam Updated Vital Signs BP (!) 155/66 (BP Location: Right Arm) Comment: RN notified  Pulse 87   Temp 99.2 F (37.3 C) (Oral)   Resp 18   Ht 5\' 7"  (1.702 m)   Wt 117 kg   SpO2 94%   BMI 40.40 kg/m   Physical Exam  Constitutional: No distress.  HENT:  Head: Normocephalic and atraumatic.  Increased nasal congestion.   Eyes: Conjunctivae are normal. Right eye exhibits no discharge. Left eye exhibits no discharge.  Neck: Normal range of motion. No tracheal deviation present.  Tracheostomy, Jean Rosenthal.   Cardiovascular: Regular rhythm and intact distal pulses.  Distant heart sounds  Pulmonary/Chest: Effort normal. No respiratory distress.  Coarse rhales bilaterally. Most prominent right base. Can speak full sentences  Abdominal: Soft. Bowel sounds are normal. He exhibits no distension. There is no tenderness.  Musculoskeletal: He exhibits no edema or deformity.  Neurological: He is alert. He exhibits normal muscle tone.  Skin: Capillary refill takes less than 2 seconds. No rash noted. He is not diaphoretic.  Superficial ulceration with scabbing to posterior left ankle with small area surrounding erythema. Does not appear cellulitic. 0.5cm round ulceration to plantar aspect left great toe with no drainage or erythema. No bony exposure.   Psychiatric: He has a normal mood and affect.     ED Treatments / Results  Labs (all labs ordered are listed, but only abnormal results are displayed) Labs Reviewed  COMPREHENSIVE METABOLIC PANEL - Abnormal; Notable for the following components:      Result Value    Potassium 3.2 (*)    Glucose, Bld 124 (*)    Calcium 8.8 (*)    Albumin 2.3 (*)    Alkaline Phosphatase 132 (*)    All other components within normal limits  CBC WITH DIFFERENTIAL/PLATELET - Abnormal; Notable for the following components:   WBC 22.6 (*)    Hemoglobin 11.9 (*)    HCT 37.7 (*)    Neutro Abs 18.2 (*)    Monocytes Absolute 1.9 (*)    Abs Immature Granulocytes 0.3 (*)    All other components within normal limits  I-STAT CG4 LACTIC ACID, ED - Abnormal; Notable for the following components:   Lactic Acid, Venous 2.13 (*)    All other components within normal limits  CULTURE, BLOOD (ROUTINE X 2)  CULTURE, BLOOD (ROUTINE X 2)  EXPECTORATED SPUTUM ASSESSMENT W REFEX TO RESP CULTURE  GRAM STAIN  LACTIC ACID, PLASMA  GLUCOSE, CAPILLARY  URINALYSIS, ROUTINE W REFLEX MICROSCOPIC  HIV ANTIBODY (ROUTINE TESTING)  STREP PNEUMONIAE URINARY ANTIGEN  LEGIONELLA PNEUMOPHILA SEROGP 1 UR AG  LACTIC ACID, PLASMA  CBC  COMPREHENSIVE METABOLIC PANEL  GAMMA GT  I-STAT CG4 LACTIC ACID, ED    EKG EKG Interpretation  Date/Time:  Friday October 09 2017 16:41:19 EDT Ventricular Rate:  92 PR Interval:    QRS Duration: 125 QT Interval:  364 QTC Calculation: 451 R Axis:   -26 Text Interpretation:  Sinus rhythm Ventricular premature complex Nonspecific intraventricular conduction delay Minimal ST depression no significant change since Feb 2019 Confirmed by Pricilla Loveless [phone]) on 10/09/2017 5:02:03 PM   Radiology Dg Chest Portable 1 View  Result Date: 10/09/2017 CLINICAL DATA:  Shortness of breath.  Low O2 saturation. EXAM: PORTABLE CHEST 1 VIEW COMPARISON:  Chest x-rays dated 08/07/2017 and 04/29/2017 FINDINGS: Tracheostomy tube in place. Heart size and vascularity are normal. Aortic atherosclerosis. New faint area of infiltrate/atelectasis at the right lung base. Left lung is clear. No acute bone abnormality. IMPRESSION: New focal area of infiltrate/atelectasis at the right lung base.  Electronically Signed   By: Francene Boyers M.D.   On: 10/09/2017 16:48    Procedures Procedures (including critical care time)  Medications Ordered in ED Medications  vancomycin (VANCOCIN) IVPB 1000 mg/200 mL premix (has no administration in time range)  ceFEPIme (MAXIPIME) 1 g in sodium chloride 0.9 % 100 mL IVPB (has no administration in time range)  enoxaparin (LOVENOX) injection 40 mg (40 mg Subcutaneous Given  10/09/17 2300)  sodium chloride flush (NS) 0.9 % injection 3 mL (3 mLs Intravenous Given 10/09/17 2313)  sodium chloride flush (NS) 0.9 % injection 3 mL (has no administration in time range)  0.9 %  sodium chloride infusion (250 mLs Intravenous New Bag/Given 10/09/17 2311)  insulin aspart (novoLOG) injection 0-9 Units (has no administration in time range)  insulin aspart (novoLOG) injection 0-5 Units (0 Units Subcutaneous Not Given 10/09/17 2158)  atorvastatin (LIPITOR) tablet 20 mg (20 mg Oral Given 10/09/17 2300)  furosemide (LASIX) tablet 20 mg (20 mg Oral Given 10/09/17 2300)  metoprolol tartrate (LOPRESSOR) tablet 75 mg (75 mg Oral Not Given 10/10/17 0011)  glipiZIDE (GLUCOTROL) tablet 5 mg (has no administration in time range)  albuterol (PROVENTIL) (2.5 MG/3ML) 0.083% nebulizer solution 2.5 mg (2.5 mg Nebulization Given 10/09/17 2314)  umeclidinium bromide (INCRUSE ELLIPTA) 62.5 MCG/INH 1 puff (1 puff Inhalation Given 10/09/17 2300)  polyethylene glycol (MIRALAX / GLYCOLAX) packet 17 g (17 g Oral Given 10/09/17 2259)  potassium chloride SA (K-DUR,KLOR-CON) CR tablet 20 mEq (has no administration in time range)  lactated ringers bolus 1,000 mL (0 mLs Intravenous Stopped 10/09/17 1830)  ceFEPIme (MAXIPIME) 2 g in sodium chloride 0.9 % 100 mL IVPB (0 g Intravenous Stopped 10/09/17 1838)  vancomycin (VANCOCIN) 2,500 mg in sodium chloride 0.9 % 500 mL IVPB (0 mg Intravenous Stopped 10/09/17 1940)     Initial Impression / Assessment and Plan / ED Course  I have reviewed the triage vital signs and  the nursing notes.  Pertinent labs & imaging results that were available during my care of the patient were reviewed by me and considered in my medical decision making (see chart for details).    62 year old male with history of CHF, COPD, tracheostomy dependence for OSA, hypertension, hyperlipidemia presents to the emergency department for evaluation of cough, fever, shortness of breath.   Pt with temp 100.0 at presentation. Sats low 90s. Copious green/yellow secretions on deep suctioning. Exam as above. No CP. ECG reviewd with rate 90, sinus with PVC, normal axis, intervals wnl, no ST/T changes to suggest ischemia. Very atypical presentation for ACS. Suspect infectious etiology such as Pneumonia. No leg swelling and no history DVT/PE. CXR obtained with RLL opacification concerning for pneumonia. Code sepsis (HR > 90 and tachypnea), cultures obtained. IV antibiotics initiated for healthcare associated pneumonia given admission in June with prolonged course of antibiotics.  Left lower extremity wounds do not appear grossly infected at this time. No tenderness. Placed on 02 via trach with improvement in saturations.  Labs reviewed with CMP that is largely unremarkable.  CBC remarkable for leukocytosis of 22.  Lactic acid within normal limits.  Patient remained stable while in the ED. Discussed case with medicine who evaluated in ED. Admitted for HCAP. Stable with no acute events in ED.   Case and plan of care discussed with Dr. Criss Alvine.   Final Clinical Impressions(s) / ED Diagnoses   Final diagnoses:  HCAP (healthcare-associated pneumonia)    ED Discharge Orders    None       Rigoberto Noel, MD 10/10/17 1027    Pricilla Loveless, MD 10/10/17 1904

## 2017-10-09 NOTE — H&P (Addendum)
TRH H&P   Patient Demographics:    Tyrone Cunningham, is a 62 y.o. male  MRN: [id number]   DOB - 06/09/1955  Admit Date - 10/09/2017  Outpatient Primary MD for the patient is Gale Journey, Micki Riley  Referring MD/NP/PA: Corrie Dandy  Outpatient Specialists:     Patient coming from:   home  Chief Complaint  Patient presents with  . Shortness of Breath      HPI:    Tyrone Cunningham  is a 62 y.o. male, w hypertension, hyperlipidemia, dm2, neuropathy, Copd on home o2 at nite. , OSA, CHF apparently presents with 3 days of cough, dry mostly .  Pt notes dyspnea and subjective fever as well. Pt was admitted about 41month ago for left lower ext cellulitis.    In ED,  CXR  IMPRESSION: New focal area of infiltrate/atelectasis at the right lung base.  Blood culture x2 Urinalysis pending LA 2.13  Na 138, K 3.2 Bun 14, Creatinine 1.11 Alb 2.3 Ast 23, Alt 28 Alk phos 132, T. Bili 0.6  Wbc 22.6, Hgb 11.9, Plt 316  Pt will be admitted for Hcap, and hypokalemia, and severe protein calorie malnutrition, and abnormal alk phos.     Review of systems:    In addition to the HPI above,  No Fever-chills, No Headache, No changes with Vision or hearing, No problems swallowing food or Liquids, No Chest pain,  No Abdominal pain, No Nausea or Vommitting, Bowel movements are regular, No Blood in stool or Urine, No dysuria, No new skin rashes or bruises, No new joints pains-aches,  No new weakness, tingling, numbness in any extremity, No recent weight gain or loss, No polyuria, polydypsia or polyphagia, No significant Mental Stressors.  A full 10 point Review of Systems was done, except as stated above, all other Review of Systems were negative.   With Past History of the following :    Past Medical History:  Diagnosis Date  . Bronchitis   . CHF (congestive heart failure) (HCC)    grade 1  diastolic with preserved EF in 2017  . COPD ([place]ronic obstructive pulmonary disease) ([place]   . DM type 2 with diabetic peripheral neuropathy ([place]   . Hearing loss   . Hypercholesteremia   . Hypertension   . Peripheral neuropathy   . Sleep apnea    Severe sleep apnea requiring tracheostomy      Past Surgical History:  Procedure Laterality Date  . APPENDECTOMY    . CHOLECYSTECTOMY N/A 01/05/2014   Procedure: LAPAROSCOPIC CHOLECYSTECTOMY;  Surgeon: MRolm Bookbinder MD;  Location: [facility];  Service: General;  Laterality: N/A;  . FEMUR HARDWARE REMOVAL  09/01/2003   Removal of retained hardware, two interlocking distal femoral  . FEMUR SURGERY    . HAND SURGERY    . MULTIPLE EXTRACTIONS WITH ALVEOLOPLASTY N/A 05/11/2012   Procedure: MULTIPLE EXTRACTION WITH ALVEOLOPLASTY;  Surgeon:  Gae Bon, DDS;  Location: [facility];  Service: Oral Surgery;  Laterality: N/A;  . TRACHEOSTOMY        Social History:     Social History   Tobacco Use  . Smoking status: Former Smoker    Packs/day: 2.00    Years: 30.00    Pack years: 60.00    Types: Cigarettes    Last attempt to quit: 07/02/1999    Years since quitting: 18.2  . Smokeless tobacco: Never Used  Substance Use Topics  . Alcohol use: No    Comment: heavy drinker, quit in 1983     Lives - at home  Mobility - walks by self   Family History :     Family History  Problem Relation Age of Onset  . Coronary artery disease Father        Home Medications:   Prior to Admission medications   Medication Sig Start Date End Date Taking? Authorizing Provider  albuterol (PROVENTIL HFA;VENTOLIN HFA) 108 (90 Base) MCG/ACT inhaler Inhale 2 puffs into the lungs every 6 (six) hours as needed for wheezing or shortness of breath. 09/10/17   Weber, Damaris Hippo, PA-C  albuterol (PROVENTIL) (2.5 MG/3ML) 0.083% nebulizer solution Take 3 mLs (2.5 mg total) by nebulization every 6 (six) hours as needed for wheezing or shortness of breath. 09/09/17   Weber,  Damaris Hippo, PA-C  atorvastatin (LIPITOR) 20 MG tablet Take 1 tablet (20 mg total) by mouth daily. 09/09/17   Weber, Damaris Hippo, PA-C  doxycycline (VIBRA-TABS) 100 MG tablet Take 1 tablet (100 mg total) by mouth 2 (two) times daily. 09/09/17   Weber, Damaris Hippo, PA-C  furosemide (LASIX) 20 MG tablet Take 1 tablet (20 mg total) by mouth daily. 09/09/17 10/09/17  Gale Journey, Damaris Hippo, PA-C  glipiZIDE (GLUCOTROL) 5 MG tablet Take 1 tablet (5 mg total) by mouth 2 (two) times daily before a meal. 09/09/17   Weber, Damaris Hippo, PA-C  metoprolol tartrate (LOPRESSOR) 50 MG tablet Take 1.5 tablets (75 mg total) by mouth 2 (two) times daily. 09/15/17   Weber, Damaris Hippo, PA-C  Metoprolol Tartrate 75 MG TABS Take 75 mg by mouth 2 (two) times daily. 09/09/17   Weber, Damaris Hippo, PA-C  OXYGEN Inhale 10 L into the lungs continuous.    [provider]  polyethylene glycol powder (GLYCOLAX/MIRALAX) powder Take 17 g by mouth daily. 09/09/17   Weber, Damaris Hippo, PA-C  umeclidinium bromide (INCRUSE ELLIPTA) 62.5 MCG/INH AEPB Inhale 1 puff into the lungs daily. 09/09/17   Weber, Damaris Hippo, PA-C     Allergies:    No Known Allergies   Physical Exam:   Vitals  Blood pressure (!) 165/72, pulse 79, temperature 100 F (37.8 C), temperature source Oral, resp. rate (!) 21, SpO2 94 %.   1. General lying in bed in NAD,   2. Normal affect and insight, Not Suicidal or Homicidal, Awake Alert, Oriented X 3.  3. No F.N deficits, ALL C.Nerves Intact, Strength 5/5 all 4 extremities, Sensation intact all 4 extremities, Plantars down going.  4. Ears and Eyes appear Normal, Conjunctivae clear, PERRLA. Moist Oral Mucosa.  5. Supple Neck, No JVD, No cervical lymphadenopathy appriciated, No Carotid Bruits.  + tracheostomy  6. Symmetrical Chest wall movement, Good air movement bilaterally, slight crackles right lung base  7. RRR, s1, s2, no m/g/r  8. Positive Bowel Sounds, Abdomen Soft, No tenderness, No organomegaly appriciated,No rebound -guarding or  rigidity.  9.  No Cyanosis, Normal Skin Turgor,  No Skin Rash or Bruise.  10. Good muscle tone,  joints appear normal , no effusions, Normal ROM.  11. No Palpable Lymph Nodes in Neck or Axillae      Data Review:    CBC Recent Labs  Lab 10/09/17 1734  WBC 22.6*  HGB 11.9*  HCT 37.7*  PLT 316  MCV 89.3  MCH 28.2  MCHC 31.6  RDW 13.2  LYMPHSABS 2.0  MONOABS 1.9*  EOSABS 0.0  BASOSABS 0.1   ------------------------------------------------------------------------------------------------------------------  Chemistries  Recent Labs  Lab 10/09/17 1734  NA 138  K 3.2*  CL 99  CO2 27  GLUCOSE 124*  BUN 14  CREATININE 1.11  CALCIUM 8.8*  AST 23  ALT 28  ALKPHOS 132*  BILITOT 0.6   ------------------------------------------------------------------------------------------------------------------ CrCl cannot be calculated (Unknown ideal weight.). ------------------------------------------------------------------------------------------------------------------ No results for input(s): TSH, T4TOTAL, T3FREE, THYROIDAB in the last 72 hours.  Invalid input(s): FREET3  Coagulation profile No results for input(s): INR, PROTIME in the last 168 hours. ------------------------------------------------------------------------------------------------------------------- No results for input(s): DDIMER in the last 72 hours. -------------------------------------------------------------------------------------------------------------------  Cardiac Enzymes No results for input(s): CKMB, TROPONINI, MYOGLOBIN in the last 168 hours.  Invalid input(s): CK ------------------------------------------------------------------------------------------------------------------    Component Value Date/Time   BNP 40.4 04/29/2017 0930   BNP 136.0 (H) 03/21/2015 1131      ---------------------------------------------------------------------------------------------------------------  Urinalysis    Component Value Date/Time   COLORURINE YELLOW 01/03/2014 1814   APPEARANCEUR CLEAR 01/03/2014 1814   LABSPEC 1.019 01/03/2014 1814   PHURINE 6.0 01/03/2014 1814   GLUCOSEU NEGATIVE 01/03/2014 1814   HGBUR TRACE (A) 01/03/2014 1814   BILIRUBINUR NEGATIVE 01/03/2014 1814   BILIRUBINUR neg 01/03/2014 1527   KETONESUR NEGATIVE 01/03/2014 1814   PROTEINUR >300 (A) 01/03/2014 1814   UROBILINOGEN 1.0 01/03/2014 1814   NITRITE NEGATIVE 01/03/2014 1814   LEUKOCYTESUR NEGATIVE 01/03/2014 1814    ----------------------------------------------------------------------------------------------------------------   Imaging Results:    Dg Chest Portable 1 View  Result Date: 10/09/2017 CLINICAL DATA:  Shortness of breath.  Low O2 saturation. EXAM: PORTABLE CHEST 1 VIEW COMPARISON:  Chest x-rays dated 08/07/2017 and 04/29/2017 FINDINGS: Tracheostomy tube in place. Heart size and vascularity are normal. Aortic atherosclerosis. New faint area of infiltrate/atelectasis at the right lung base. Left lung is clear. No acute bone abnormality. IMPRESSION: New focal area of infiltrate/atelectasis at the right lung base. Electronically Signed   By: Lorriane Shire M.D.   On: 10/09/2017 16:48    ekg nsr at 69, nl axis   Assessment & Plan:    Principal Problem:   HCAP (healthcare-associated pneumonia) Active Problems:   [name] with frequent exacerbations   Diabetic neuropathy ([place])   Tracheostomy dependent ([place])   DM (diabetes mellitus), type 2 with neurological complications (HCC)   Obstructive sleep apnea   Hypokalemia   Abnormal liver function   Severe protein-calorie malnutrition (HCC)    Hcap Blood culture x2 Sputum gram stain culture Urine legionella, urine strep antigen vanco iv, cefepime iv pharmacy to dose  Hypokalemia Replete Check cmp in  am  Abnormal liver function Check GGT in am  Severe protein calorie malnutrition prostat 51m po bid  Dm2 Cont Glipizide 571mpo bid fsbs ac and qhs, ISS  CHF Cont Furosemide 2020mo qday  Cont lopressor 33m63mo bid  Copd Cont incruse Albuterol neb q6h prn   Hyperlipidemia Cont Lipitor 20mg11mqhs   DVT Prophylaxis Lovenox - SCDs  AM Labs Ordered, also please review Full Orders  Family Communication: Admission,  patients condition and plan of care including tests being ordered have been discussed with the patient  who indicate understanding and agree with the plan and Code Status.  Code Status  FULL CODE  Likely DC to  home  Condition GUARDED   Consults called: none  Admission status: inpatient   Time spent in minutes : 70   Jani Gravel M.D on 10/09/2017 at 6:45 PM  Between 7am to 7pm - Pager - [phone]   . After 7pm go to [email or web address] - password [facility]  [facility] - Office  [phone]

## 2017-10-09 NOTE — ED Triage Notes (Signed)
Pt here from motel with c/o sob and sats of 88% pt has a  chronic trach and stats that he needs suctioning

## 2017-10-09 NOTE — Progress Notes (Signed)
Patient leaving for transport to floor.

## 2017-10-10 ENCOUNTER — Other Ambulatory Visit: Payer: Self-pay

## 2017-10-10 LAB — COMPREHENSIVE METABOLIC PANEL
ALT: 29 U/L (ref 0–44)
AST: 28 U/L (ref 15–41)
Albumin: 1.9 g/dL — ABNORMAL LOW (ref 3.5–5.0)
Alkaline Phosphatase: 119 U/L (ref 38–126)
Anion gap: 12 (ref 5–15)
BUN: 15 mg/dL (ref 8–23)
CHLORIDE: 100 mmol/L (ref 98–111)
CO2: 27 mmol/L (ref 22–32)
Calcium: 8.5 mg/dL — ABNORMAL LOW (ref 8.9–10.3)
Creatinine, Ser: 1.01 mg/dL (ref 0.61–1.24)
GFR calc Af Amer: >60 mL/min (ref >60)
Glucose, Bld: 88 mg/dL (ref 70–99)
POTASSIUM: 3.3 mmol/L — AB (ref 3.5–5.1)
Sodium: 139 mmol/L (ref 135–145)
Total Bilirubin: 0.6 mg/dL (ref 0.3–1.2)
Total Protein: 6.5 g/dL (ref 6.5–8.1)

## 2017-10-10 LAB — HIV ANTIBODY (ROUTINE TESTING W REFLEX): HIV Screen 4th Generation wRfx: NONREACTIVE

## 2017-10-10 LAB — URINALYSIS, ROUTINE W REFLEX MICROSCOPIC
Bilirubin Urine: NEGATIVE
Glucose, UA: 150 mg/dL — AB
Ketones, ur: NEGATIVE mg/dL
Leukocytes, UA: NEGATIVE
Nitrite: NEGATIVE
Specific Gravity, Urine: 1.019 (ref 1.005–1.030)
pH: 6 (ref 5.0–8.0)

## 2017-10-10 LAB — CBC
HCT: 35.9 % — ABNORMAL LOW (ref 39.0–52.0)
Hemoglobin: 11.5 g/dL — ABNORMAL LOW (ref 13.0–17.0)
MCH: 28.5 pg (ref 26.0–34.0)
MCHC: 32 g/dL (ref 30.0–36.0)
MCV: 89.1 fL (ref 78.0–100.0)
Platelets: 278 10*3/uL (ref 150–400)
RBC: 4.03 MIL/uL — ABNORMAL LOW (ref 4.22–5.81)
RDW: 13.2 % (ref 11.5–15.5)
WBC: 22 10*3/uL — AB (ref 4.0–10.5)

## 2017-10-10 LAB — GLUCOSE, CAPILLARY
GLUCOSE-CAPILLARY: 167 mg/dL — AB (ref 70–99)
GLUCOSE-CAPILLARY: 99 mg/dL (ref 70–99)
Glucose-Capillary: 164 mg/dL — ABNORMAL HIGH (ref 70–99)
Glucose-Capillary: 147 mg/dL — ABNORMAL HIGH (ref 70–99)

## 2017-10-10 LAB — LACTIC ACID, PLASMA: Lactic Acid, Venous: 1.2 mmol/L (ref 0.5–1.9)

## 2017-10-10 LAB — BLOOD CULTURE ID PANEL (REFLEXED)
Acinetobacter baumannii: NOT DETECTED
CANDIDA TROPICALIS: NOT DETECTED
Candida albicans: NOT DETECTED
Candida glabrata: NOT DETECTED
Candida krusei: NOT DETECTED
Candida parapsilosis: NOT DETECTED
ENTEROBACTERIACEAE SPECIES: NOT DETECTED
Enterobacter cloacae complex: NOT DETECTED
Enterococcus species: NOT DETECTED
Escherichia coli: NOT DETECTED
HAEMOPHILUS INFLUENZAE: NOT DETECTED
KLEBSIELLA PNEUMONIAE: NOT DETECTED
Klebsiella oxytoca: NOT DETECTED
Listeria monocytogenes: NOT DETECTED
Methicillin resistance: DETECTED — AB
NEISSERIA MENINGITIDIS: NOT DETECTED
PROTEUS SPECIES: NOT DETECTED
Pseudomonas aeruginosa: NOT DETECTED
STAPHYLOCOCCUS SPECIES: DETECTED — AB
STREPTOCOCCUS SPECIES: NOT DETECTED
Serratia marcescens: NOT DETECTED
Staphylococcus aureus (BCID): NOT DETECTED
Streptococcus agalactiae: NOT DETECTED
Streptococcus pneumoniae: NOT DETECTED
Streptococcus pyogenes: NOT DETECTED

## 2017-10-10 LAB — MAGNESIUM: MAGNESIUM: 1.9 mg/dL (ref 1.7–2.4)

## 2017-10-10 LAB — MRSA PCR SCREENING: MRSA BY PCR: NEGATIVE

## 2017-10-10 LAB — GAMMA GT: GGT: 98 U/L — AB (ref 7–50)

## 2017-10-10 MED ORDER — OXYCODONE-ACETAMINOPHEN 10-325 MG PO TABS: 1.0000 | ORAL_TABLET | Freq: Four times a day (QID) | ORAL | Status: DC | PRN

## 2017-10-10 MED ORDER — POTASSIUM CHLORIDE CRYS ER 20 MEQ PO TBCR
20.0000 meq | EXTENDED_RELEASE_TABLET | Freq: Once | ORAL | Status: AC
  Administered 2017-10-10: 20 meq via ORAL
  Filled 2017-10-10: qty 1

## 2017-10-10 MED ORDER — OXYCODONE-ACETAMINOPHEN 5-325 MG PO TABS
1.0000 | ORAL_TABLET | Freq: Four times a day (QID) | ORAL | Status: DC | PRN
  Administered 2017-10-10 – 2017-10-15 (×12): 1 via ORAL
  Filled 2017-10-10 (×12): qty 1

## 2017-10-10 MED ORDER — TRAMADOL HCL 50 MG PO TABS
50.0000 mg | ORAL_TABLET | Freq: Three times a day (TID) | ORAL | Status: DC | PRN
Start: 2017-10-10 — End: 2017-10-15
  Administered 2017-10-10 – 2017-10-13 (×2): 100 mg via ORAL
  Administered 2017-10-13: 50 mg via ORAL
  Administered 2017-10-14 – 2017-10-15 (×3): 100 mg via ORAL
  Filled 2017-10-10 (×5): qty 2
  Filled 2017-10-10: qty 1

## 2017-10-10 MED ORDER — POTASSIUM CHLORIDE CRYS ER 20 MEQ PO TBCR
40.0000 meq | EXTENDED_RELEASE_TABLET | Freq: Once | ORAL | Status: AC
  Administered 2017-10-10: 40 meq via ORAL
  Filled 2017-10-10: qty 2

## 2017-10-10 MED ORDER — OXYCODONE HCL 5 MG PO TABS
5.0000 mg | ORAL_TABLET | Freq: Four times a day (QID) | ORAL | Status: DC | PRN
  Administered 2017-10-10 – 2017-10-15 (×11): 5 mg via ORAL
  Filled 2017-10-10 (×12): qty 1

## 2017-10-10 NOTE — Plan of Care (Signed)
Discussed with patient plan of care for the evening, pain management and other medications with some teach back displayed

## 2017-10-10 NOTE — Progress Notes (Signed)
PHARMACY - PHYSICIAN COMMUNICATION CRITICAL VALUE ALERT - BLOOD CULTURE IDENTIFICATION (BCID)  Tyrone BaileyMark Cunningham is an 62 y.o. male who [facility]/2017 with a chief complaint of shortness of breath.   Assessment: 62 yom started on antibiotics for possible pneumonia. 2/4 bottles with coag negative staph. Pt with Tmax 100 and WBC is elevated at 22. Lactic acid has normalized.   Name of physician (or Provider) Contacted: Rizwan  Current antibiotics: vancomycin + cefepime  Changes to prescribed antibiotics recommended:  Patient is on recommended antibiotics - No changes needed - possible contaminant but also treating pneumonia  Results for orders placed or performed during the hospital encounter of 10/09/17  Blood Culture ID Panel (Reflexed) (Collected: 10/09/2017  4:59 PM)  Result Value Ref Range   Enterococcus species NOT DETECTED NOT DETECTED   Listeria monocytogenes NOT DETECTED NOT DETECTED   Staphylococcus species DETECTED (A) NOT DETECTED   Staphylococcus aureus NOT DETECTED NOT DETECTED   Methicillin resistance DETECTED (A) NOT DETECTED   Streptococcus species NOT DETECTED NOT DETECTED   Streptococcus agalactiae NOT DETECTED NOT DETECTED   Streptococcus pneumoniae NOT DETECTED NOT DETECTED   Streptococcus pyogenes NOT DETECTED NOT DETECTED   Acinetobacter baumannii NOT DETECTED NOT DETECTED   Enterobacteriaceae species NOT DETECTED NOT DETECTED   Enterobacter cloacae complex NOT DETECTED NOT DETECTED   Escherichia coli NOT DETECTED NOT DETECTED   Klebsiella oxytoca NOT DETECTED NOT DETECTED   Klebsiella pneumoniae NOT DETECTED NOT DETECTED   Proteus species NOT DETECTED NOT DETECTED   Serratia marcescens NOT DETECTED NOT DETECTED   Haemophilus influenzae NOT DETECTED NOT DETECTED   Neisseria meningitidis NOT DETECTED NOT DETECTED   Pseudomonas aeruginosa NOT DETECTED NOT DETECTED   Candida albicans NOT DETECTED NOT DETECTED   Candida glabrata NOT DETECTED NOT  DETECTED   Candida krusei NOT DETECTED NOT DETECTED   Candida parapsilosis NOT DETECTED NOT DETECTED   Candida tropicalis NOT DETECTED NOT DETECTED    Chistina Roston, Drake LeachRachel Lynn 10/10/2017  3:33 PM

## 2017-10-10 NOTE — Progress Notes (Addendum)
PROGRESS NOTE    Tyrone BaileyMark Cunningham   ZOX:[id number]  DOB: 11/23/1955  DOA: 10/09/2017 PCP: Morrell RiddleWeber, [name], PA-C   Brief Narrative:  Tyrone BaileyMark Cunningham is a 62 y/o with a tracheostomy, HTN, DM2, neuropathy, COPD, OSA, CHF (grade 1 d CHF) who presents for dyspnea, cough and subjective fevers. Pulse ox was 88% on room air.  Temp 100 in ED, green sputum when suctioned, RLL infiltrate, WBC count 22. Admitted for treatment of pneumonia.   Subjective: Not feeling much better yet. Still has significant cough and some dyspnea on exertion.     Assessment & Plan:   Principal Problem:   HCAP (healthcare-associated pneumonia)/ sepsis - Cefepime and Vancomycin- awaiting MRSA PCR - blood cultures negative - sputum culture pending  Active Problems:   [name] with frequent exacerbations   Obstructive sleep apnea   Chronic respiratory failure   Tracheostomy dependent for many years - apparently is on O2 at night - no wheezing today- cont Nebs and Incurse Ellipta    Diabetic neuropathy  - cont Ultram and Percocets      DM (diabetes mellitus), type 2 with neurological complications  - on Glucotrol at home- place on SSI for now    Hypokalemia - replace- Mg- normal  Severe protein-calorie malnutrition  Hypoalbuminemia  - Alb 1.9 - dietician consult requested  Pedal edema - has grade 1 d CHF on last ECHO from 2017- on daily Lasix- can continue this as he appears euvolemic   DVT prophylaxis: Lovenox Code Status: Full code Family Communication:  Disposition Plan: home when stable Consultants:   none Procedures:   none Antimicrobials:  Anti-infectives (From admission, onward)   Start     Dose/Rate Route Frequency Ordered Stop   10/10/17 0600  vancomycin (VANCOCIN) IVPB 1000 mg/200 mL premix     1,000 mg 200 mL/hr over 60 Minutes Intravenous Every 12 hours 10/09/17 1849     10/10/17 0130  ceFEPIme (MAXIPIME) 1 g in sodium chloride 0.9 % 100 mL IVPB     1 g 200 mL/hr over 30 Minutes  Intravenous Every 8 hours 10/09/17 1849     10/09/17 1800  vancomycin (VANCOCIN) 2,500 mg in sodium chloride 0.9 % 500 mL IVPB     2,500 mg 250 mL/hr over 120 Minutes Intravenous  Once 10/09/17 1718 10/09/17 1940   10/09/17 1700  ceFEPIme (MAXIPIME) 2 g in sodium chloride 0.9 % 100 mL IVPB     2 g 200 mL/hr over 30 Minutes Intravenous  Once 10/09/17 1657 10/09/17 1838   10/09/17 1700  vancomycin (VANCOCIN) IVPB 1000 mg/200 mL premix  Status:  Discontinued     1,000 mg 200 mL/hr over 60 Minutes Intravenous  Once 10/09/17 1657 10/09/17 1718       Objective: Vitals:   10/10/17 0123 10/10/17 0806 10/10/17 0808 10/10/17 1155  BP: (!) 141/79  (!) 164/85   Pulse: 85 95 95   Resp:   (!) 28   Temp: 98.6 F (37 C)  98 F (36.7 C)   TempSrc: Oral  Oral   SpO2: 98% 100% 100% 100%  Weight:      Height:        Intake/Output Summary (Last 24 hours) at 10/10/2017 1307 Last data filed at 10/10/2017 1242 Gross per 24 hour  Intake 978.81 ml  Output 1250 ml  Net -271.19 ml   Filed Weights   10/09/17 2027  Weight: 117 kg    Examination: General exam: Appears comfortable  HEENT: PERRLA, oral  mucosa moist, no sclera icterus or thrush- Tracheostomy intact Respiratory system: Clear to auscultation. Respiratory effort normal. Cardiovascular system: S1 & S2 heard, RRR.   Gastrointestinal system: Abdomen soft, non-tender, nondistended. Normal bowel sound. No organomegaly Central nervous system: Alert and oriented. No focal neurological deficits. Extremities: No cyanosis, clubbing or edema- + pedal edema Skin: No rashes or ulcers Psychiatry:  Mood & affect appropriate.   Data Reviewed: I have personally reviewed following labs and imaging studies  CBC: Recent Labs  Lab 10/09/17 1734 10/10/17 0311  WBC 22.6* 22.0*  NEUTROABS 18.2*  --   HGB 11.9* 11.5*  HCT 37.7* 35.9*  MCV 89.3 89.1  PLT 316 278   Basic Metabolic Panel: Recent Labs  Lab 10/09/17 1734 10/10/17 0311  10/10/17 0836  NA 138 139  --   K 3.2* 3.3*  --   CL 99 100  --   CO2 27 27  --   GLUCOSE 124* 88  --   BUN 14 15  --   CREATININE 1.11 1.01  --   CALCIUM 8.8* 8.5*  --   MG  --   --  1.9   GFR: Estimated Creatinine Clearance: 90.4 mL/min (by C-G formula based on SCr of 1.01 mg/dL). Liver Function Tests: Recent Labs  Lab 10/09/17 1734 10/10/17 0311  AST 23 28  ALT 28 29  ALKPHOS 132* 119  BILITOT 0.6 0.6  PROT 7.1 6.5  ALBUMIN 2.3* 1.9*   No results for input(s): LIPASE, AMYLASE in the last 168 hours. No results for input(s): AMMONIA in the last 168 hours. Coagulation Profile: No results for input(s): INR, PROTIME in the last 168 hours. Cardiac Enzymes: No results for input(s): CKTOTAL, CKMB, CKMBINDEX, TROPONINI in the last 168 hours. BNP (last 3 results) No results for input(s): PROBNP in the last 8760 hours. HbA1C: No results for input(s): HGBA1C in the last 72 hours. CBG: Recent Labs  Lab 10/09/17 2155 10/10/17 0730 10/10/17 1202  GLUCAP 74 167* 99   Lipid Profile: No results for input(s): CHOL, HDL, LDLCALC, TRIG, CHOLHDL, LDLDIRECT in the last 72 hours. Thyroid Function Tests: No results for input(s): TSH, T4TOTAL, FREET4, T3FREE, THYROIDAB in the last 72 hours. Anemia Panel: No results for input(s): VITAMINB12, FOLATE, FERRITIN, TIBC, IRON, RETICCTPCT in the last 72 hours. Urine analysis:    Component Value Date/Time   COLORURINE YELLOW 01/03/2014 1814   APPEARANCEUR CLEAR 01/03/2014 1814   LABSPEC 1.019 01/03/2014 1814   PHURINE 6.0 01/03/2014 1814   GLUCOSEU NEGATIVE 01/03/2014 1814   HGBUR TRACE (A) 01/03/2014 1814   BILIRUBINUR NEGATIVE 01/03/2014 1814   BILIRUBINUR neg 01/03/2014 1527   KETONESUR NEGATIVE 01/03/2014 1814   PROTEINUR >300 (A) 01/03/2014 1814   UROBILINOGEN 1.0 01/03/2014 1814   NITRITE NEGATIVE 01/03/2014 1814   LEUKOCYTESUR NEGATIVE 01/03/2014 1814   Sepsis Labs: @LABRCNTIP (procalcitonin:4,lacticidven:4) ) Recent  Results (from the past 240 hour(s))  Blood Culture (routine x 2)     Status: None (Preliminary result)   Collection Time: 10/09/17  4:54 PM  Result Value Ref Range Status   Specimen Description BLOOD BLOOD LEFT HAND  Final   Special Requests   Final    BOTTLES DRAWN AEROBIC AND ANAEROBIC Blood Culture adequate volume   Culture   Final    NO GROWTH < 24 HOURS Performed at [facility], [address] [place], [place] [address]    Report Status PENDING  Incomplete  Blood Culture (routine x 2)     Status: None (  Preliminary result)   Collection Time: 10/09/17  4:59 PM  Result Value Ref Range Status   Specimen Description BLOOD LEFT ANTECUBITAL  Final   Special Requests   Final    BOTTLES DRAWN AEROBIC AND ANAEROBIC Blood Culture adequate volume   Culture  Setup Time   Final    GRAM POSITIVE COCCI IN CLUSTERS ANAEROBIC BOTTLE ONLY Organism ID to follow Performed at [facility], [address], [place], [place] [address]    Culture GRAM POSITIVE COCCI  Final   Report Status PENDING  Incomplete         Radiology Studies: Dg Chest Portable 1 View  Result Date: 10/09/2017 CLINICAL DATA:  Shortness of breath.  Low O2 saturation. EXAM: PORTABLE CHEST 1 VIEW COMPARISON:  Chest x-rays dated 08/07/2017 and 04/29/2017 FINDINGS: Tracheostomy tube in place. Heart size and vascularity are normal. Aortic atherosclerosis. New faint area of infiltrate/atelectasis at the right lung base. Left lung is clear. No acute bone abnormality. IMPRESSION: New focal area of infiltrate/atelectasis at the right lung base. Electronically Signed   By: Francene Boyers M.D.   On: 10/09/2017 16:48      Scheduled Meds: . atorvastatin  20 mg Oral Daily  . enoxaparin (LOVENOX) injection  40 mg Subcutaneous Q24H  . furosemide  20 mg Oral Daily  . glipiZIDE  5 mg Oral BID AC  . insulin aspart  0-5 Units Subcutaneous QHS  . insulin aspart  0-9 Units Subcutaneous TID WC  . metoprolol tartrate  75 mg Oral  BID  . polyethylene glycol  17 g Oral Daily  . sodium chloride flush  3 mL Intravenous Q12H  . umeclidinium bromide  1 puff Inhalation Daily   Continuous Infusions: . sodium chloride Stopped (10/10/17 0616)  . ceFEPime (MAXIPIME) IV 1 g (10/10/17 0905)  . vancomycin 200 mL/hr at 10/10/17 0621     LOS: 1 day    Time spent in minutes: 35    Calvert Cantor, MD [facility] Pager: [email or web address] Password [email or web address] 10/10/2017, 1:07 PM

## 2017-10-11 DIAGNOSIS — G4733 Obstructive sleep apnea (adult) (pediatric): Secondary | ICD-10-CM

## 2017-10-11 LAB — BASIC METABOLIC PANEL
ANION GAP: 10 (ref 5–15)
BUN: 14 mg/dL (ref 8–23)
CHLORIDE: 97 mmol/L — AB (ref 98–111)
CO2: 29 mmol/L (ref 22–32)
CREATININE: 1.14 mg/dL (ref 0.61–1.24)
Calcium: 8.5 mg/dL — ABNORMAL LOW (ref 8.9–10.3)
GFR calc Af Amer: >60 mL/min (ref >60)
GFR calc non Af Amer: >60 mL/min (ref >60)
GLUCOSE: 208 mg/dL — AB (ref 70–99)
Potassium: 3.5 mmol/L (ref 3.5–5.1)
Sodium: 136 mmol/L (ref 135–145)

## 2017-10-11 LAB — GLUCOSE, CAPILLARY
GLUCOSE-CAPILLARY: 144 mg/dL — AB (ref 70–99)
GLUCOSE-CAPILLARY: 157 mg/dL — AB (ref 70–99)
Glucose-Capillary: 153 mg/dL — ABNORMAL HIGH (ref 70–99)
Glucose-Capillary: 214 mg/dL — ABNORMAL HIGH (ref 70–99)

## 2017-10-11 LAB — CBC
HCT: 32.6 % — ABNORMAL LOW (ref 39.0–52.0)
HEMOGLOBIN: 10.4 g/dL — AB (ref 13.0–17.0)
MCH: 28.7 pg (ref 26.0–34.0)
MCHC: 31.9 g/dL (ref 30.0–36.0)
MCV: 89.8 fL (ref 78.0–100.0)
Platelets: 247 10*3/uL (ref 150–400)
RBC: 3.63 MIL/uL — ABNORMAL LOW (ref 4.22–5.81)
RDW: 13.2 % (ref 11.5–15.5)
WBC: 18.7 10*3/uL — ABNORMAL HIGH (ref 4.0–10.5)

## 2017-10-11 LAB — STREP PNEUMONIAE URINARY ANTIGEN: Strep Pneumo Urinary Antigen: NEGATIVE

## 2017-10-11 MED ORDER — GLIPIZIDE 5 MG PO TABS
5.0000 mg | ORAL_TABLET | Freq: Two times a day (BID) | ORAL | Status: DC
  Administered 2017-10-11 – 2017-10-15 (×10): 5 mg via ORAL
  Filled 2017-10-11 (×10): qty 1

## 2017-10-11 MED ORDER — POTASSIUM CHLORIDE CRYS ER 20 MEQ PO TBCR
40.0000 meq | EXTENDED_RELEASE_TABLET | Freq: Once | ORAL | Status: AC
  Administered 2017-10-11: 40 meq via ORAL
  Filled 2017-10-11: qty 2

## 2017-10-11 NOTE — Progress Notes (Signed)
Nutrition Brief Note  Patient with PMH trach, HTN, HLD, DM2, Neuropathy, COPD on home O2 at night, OSA, CHF, presents with 3 days of cough and dyspnea. Found to have HCAP. RD consulted for low albumin  Albumin has a half-life of 21 days and is strongly affected by stress response and inflammatory process, therefore, do not expect to see an improvement in this lab value during acute hospitalization.   Wt Readings from Last 15 Encounters:  10/09/17 117 kg  09/09/17 122.7 kg  08/15/17 115.7 kg  08/14/17 115.2 kg  07/23/17 117.2 kg  05/26/17 115.2 kg  02/26/17 119.3 kg  11/28/16 124.4 kg  10/14/16 119.3 kg  08/04/16 128.4 kg  04/12/15 (!) 155.5 kg  03/24/15 (!) 155.5 kg  07/11/14 (!) 156.5 kg  04/13/14 (!) 157.4 kg  01/07/14 (!) 148.1 kg    Spoke with patient at bedside. Has exhibited weight fluctuations over the past 5 months. Continues to eat well. Normally eats oatmeal for breakfast, 2 sandwiches or leftovers for lunch, and a "pot luck" for dinner. Good appetite. NFPE WNL. Patient has no nutrition related complaints at this time.  Body mass index is 40.4 kg/m. Patient meets criteria for obese class III based on current BMI.   Current diet order is heart healthy/carb modified, patient is consuming approximately 100% of meals at this time. Labs and medications reviewed.   No nutrition interventions warranted at this time. If nutrition issues arise, please consult RD.   Tyrone AnoWilliam M. Tayshun Gappa, MS, RD LDN Inpatient Clinical Dietitian Pager [phone]

## 2017-10-11 NOTE — Progress Notes (Signed)
PROGRESS NOTE    Tyrone BaileyMark Cunningham   ZOX:[id number]  DOB: 12/30/1955  DOA: 10/09/2017 PCP: Morrell RiddleWeber, [name], PA-C   Brief Narrative:  Tyrone BaileyMark Cunningham is a 62 y/o with a tracheostomy, HTN, DM2, neuropathy, COPD, OSA, CHF (grade 1 d CHF) who presents for dyspnea, cough and subjective fevers. Pulse ox was 88% on room air.  Temp 100 in ED, green sputum when suctioned, RLL infiltrate, WBC count 22. Admitted for treatment of pneumonia.   Subjective: He still cannot tell if he is getting better. Still has a cough. No dyspnea at rest but mild when ambulating.     Assessment & Plan:   Principal Problem:   HCAP (healthcare-associated pneumonia)/ sepsis - he is on Cefepime and Vancomycin -  MRSA PCR neg- will d/c Vancomycin - fevers resolved- WBC count steadily improving - strep pneumo urine antigen negative  - blood cultures > coag neg staph in one bottle- likely contaminant  - sputum culture pending  Active Problems:   [name] with frequent exacerbations   Obstructive sleep apnea   Chronic respiratory failure   Tracheostomy dependent for many years - apparently is on O2 at night - no wheezing on exam to suggest COPD flare - cont Nebs and Incurse Ellipta    Diabetic neuropathy  - cont Ultram and Percocets    DM (diabetes mellitus), type 2 with neurological complications  - on Glucotrol at home- place on SSI for now - sugars stable    Hypokalemia - replaced- Mg- normal  Severe protein-calorie malnutrition  Hypoalbuminemia  - Alb 1.9 - dietician consult requested  Pedal edema - has grade 1 d CHF on last ECHO from 2017- on daily Lasix- can continue this as he appears euvolemic   DVT prophylaxis: Lovenox Code Status: Full code Family Communication:  Disposition Plan: home when stable Consultants:   none Procedures:   none Antimicrobials:  Anti-infectives (From admission, onward)   Start     Dose/Rate Route Frequency Ordered Stop   10/10/17 0600  vancomycin (VANCOCIN) IVPB  1000 mg/200 mL premix     1,000 mg 200 mL/hr over 60 Minutes Intravenous Every 12 hours 10/09/17 1849     10/10/17 0130  ceFEPIme (MAXIPIME) 1 g in sodium chloride 0.9 % 100 mL IVPB     1 g 200 mL/hr over 30 Minutes Intravenous Every 8 hours 10/09/17 1849     10/09/17 1800  vancomycin (VANCOCIN) 2,500 mg in sodium chloride 0.9 % 500 mL IVPB     2,500 mg 250 mL/hr over 120 Minutes Intravenous  Once 10/09/17 1718 10/09/17 1940   10/09/17 1700  ceFEPIme (MAXIPIME) 2 g in sodium chloride 0.9 % 100 mL IVPB     2 g 200 mL/hr over 30 Minutes Intravenous  Once 10/09/17 1657 10/09/17 1838   10/09/17 1700  vancomycin (VANCOCIN) IVPB 1000 mg/200 mL premix  Status:  Discontinued     1,000 mg 200 mL/hr over 60 Minutes Intravenous  Once 10/09/17 1657 10/09/17 1718       Objective: Vitals:   10/11/17 0922 10/11/17 0923 10/11/17 1007 10/11/17 1240  BP:      Pulse:  89  92  Resp:  (!) 21  20  Temp:      TempSrc:      SpO2: 94% 94% 94% 94%  Weight:      Height:        Intake/Output Summary (Last 24 hours) at 10/11/2017 1412 Last data filed at 10/11/2017 1213 Gross per 24  hour  Intake 2710.32 ml  Output 2200 ml  Net 510.32 ml   Filed Weights   10/09/17 2027  Weight: 117 kg    Examination: General exam: Appears comfortable  HEENT: PERRLA, oral mucosa moist, no sclera icterus or thrush- Tracheostomy present Respiratory system: Clear to auscultation. Respiratory effort normal. Cardiovascular system: S1 & S2 heard, RRR.   Gastrointestinal system: Abdomen soft, non-tender, nondistended. Normal bowel sound. No organomegaly Central nervous system: Alert and oriented. No focal neurological deficits. Extremities: No cyanosis, clubbing or edema- mild pedal edema Skin: No rashes or ulcers Psychiatry:  Mood & affect appropriate.   Data Reviewed: I have personally reviewed following labs and imaging studies  CBC: Recent Labs  Lab 10/09/17 1734 10/10/17 0311 10/11/17 0219  WBC 22.6* 22.0*  18.7*  NEUTROABS 18.2*  --   --   HGB 11.9* 11.5* 10.4*  HCT 37.7* 35.9* 32.6*  MCV 89.3 89.1 89.8  PLT 316 278 247   Basic Metabolic Panel: Recent Labs  Lab 10/09/17 1734 10/10/17 0311 10/10/17 0836 10/11/17 0219  NA 138 139  --  136  K 3.2* 3.3*  --  3.5  CL 99 100  --  97*  CO2 27 27  --  29  GLUCOSE 124* 88  --  208*  BUN 14 15  --  14  CREATININE 1.11 1.01  --  1.14  CALCIUM 8.8* 8.5*  --  8.5*  MG  --   --  1.9  --    GFR: Estimated Creatinine Clearance: 80.1 mL/min (by C-G formula based on SCr of 1.14 mg/dL). Liver Function Tests: Recent Labs  Lab 10/09/17 1734 10/10/17 0311  AST 23 28  ALT 28 29  ALKPHOS 132* 119  BILITOT 0.6 0.6  PROT 7.1 6.5  ALBUMIN 2.3* 1.9*   No results for input(s): LIPASE, AMYLASE in the last 168 hours. No results for input(s): AMMONIA in the last 168 hours. Coagulation Profile: No results for input(s): INR, PROTIME in the last 168 hours. Cardiac Enzymes: No results for input(s): CKTOTAL, CKMB, CKMBINDEX, TROPONINI in the last 168 hours. BNP (last 3 results) No results for input(s): PROBNP in the last 8760 hours. HbA1C: No results for input(s): HGBA1C in the last 72 hours. CBG: Recent Labs  Lab 10/10/17 1202 10/10/17 1618 10/10/17 2015 10/11/17 0742 10/11/17 1137  GLUCAP 99 164* 147* 153* 144*   Lipid Profile: No results for input(s): CHOL, HDL, LDLCALC, TRIG, CHOLHDL, LDLDIRECT in the last 72 hours. Thyroid Function Tests: No results for input(s): TSH, T4TOTAL, FREET4, T3FREE, THYROIDAB in the last 72 hours. Anemia Panel: No results for input(s): VITAMINB12, FOLATE, FERRITIN, TIBC, IRON, RETICCTPCT in the last 72 hours. Urine analysis:    Component Value Date/Time   COLORURINE YELLOW 10/10/2017 2238   APPEARANCEUR CLOUDY (A) 10/10/2017 2238   LABSPEC 1.019 10/10/2017 2238   PHURINE 6.0 10/10/2017 2238   GLUCOSEU 150 (A) 10/10/2017 2238   HGBUR SMALL (A) 10/10/2017 2238   BILIRUBINUR NEGATIVE 10/10/2017 2238    BILIRUBINUR neg 01/03/2014 1527   KETONESUR NEGATIVE 10/10/2017 2238   PROTEINUR >=300 (A) 10/10/2017 2238   UROBILINOGEN 1.0 01/03/2014 1814   NITRITE NEGATIVE 10/10/2017 2238   LEUKOCYTESUR NEGATIVE 10/10/2017 2238   Sepsis Labs: @LABRCNTIP (procalcitonin:4,lacticidven:4) ) Recent Results (from the past 240 hour(s))  Blood Culture (routine x 2)     Status: None (Preliminary result)   Collection Time: 10/09/17  4:54 PM  Result Value Ref Range Status   Specimen Description BLOOD BLOOD LEFT HAND  Final   Special Requests   Final    BOTTLES DRAWN AEROBIC AND ANAEROBIC Blood Culture adequate volume   Culture   Final    NO GROWTH < 24 HOURS Performed at [facility], [address], [place], [place] [address]    Report Status PENDING  Incomplete  Blood Culture (routine x 2)     Status: Abnormal (Preliminary result)   Collection Time: 10/09/17  4:59 PM  Result Value Ref Range Status   Specimen Description BLOOD LEFT ANTECUBITAL  Final   Special Requests   Final    BOTTLES DRAWN AEROBIC AND ANAEROBIC Blood Culture adequate volume   Culture  Setup Time   Final    GRAM POSITIVE COCCI IN CLUSTERS IN BOTH AEROBIC AND ANAEROBIC BOTTLES CRITICAL RESULT CALLED TO, READ BACK BY AND VERIFIED WITH: R. RUMBARGER PHARMD, AT 1530 10/10/17 BY D. VANHOOK    Culture (A)  Final    STAPHYLOCOCCUS SPECIES (COAGULASE NEGATIVE) THE SIGNIFICANCE OF ISOLATING THIS ORGANISM FROM A SINGLE SET OF BLOOD CULTURES WHEN MULTIPLE SETS ARE DRAWN IS UNCERTAIN. PLEASE NOTIFY THE MICROBIOLOGY DEPARTMENT WITHIN ONE WEEK IF SPECIATION AND SENSITIVITIES ARE REQUIRED. Performed at [facility], [address], [place], [place] [address]    Report Status PENDING  Incomplete  Blood Culture ID Panel (Reflexed)     Status: Abnormal   Collection Time: 10/09/17  4:59 PM  Result Value Ref Range Status   Enterococcus species NOT DETECTED NOT DETECTED Final   Listeria monocytogenes NOT DETECTED NOT DETECTED Final    Staphylococcus species DETECTED (A) NOT DETECTED Final    Comment: Methicillin (oxacillin) resistant coagulase negative staphylococcus. Possible blood culture contaminant (unless isolated from more than one blood culture draw or clinical case suggests pathogenicity). No antibiotic treatment is indicated for blood  culture contaminants. CRITICAL RESULT CALLED TO, READ BACK BY AND VERIFIED WITH: R. RUMBARGER PHARMD, AT 1530 10/10/17 BY D. VANHOOK    Staphylococcus aureus NOT DETECTED NOT DETECTED Final   Methicillin resistance DETECTED (A) NOT DETECTED Final    Comment: CRITICAL RESULT CALLED TO, READ BACK BY AND VERIFIED WITH: R. RUMBARGER PHARMD, AT 1530 10/10/17 BY D. VANHOOK    Streptococcus species NOT DETECTED NOT DETECTED Final   Streptococcus agalactiae NOT DETECTED NOT DETECTED Final   Streptococcus pneumoniae NOT DETECTED NOT DETECTED Final   Streptococcus pyogenes NOT DETECTED NOT DETECTED Final   Acinetobacter baumannii NOT DETECTED NOT DETECTED Final   Enterobacteriaceae species NOT DETECTED NOT DETECTED Final   Enterobacter cloacae complex NOT DETECTED NOT DETECTED Final   Escherichia coli NOT DETECTED NOT DETECTED Final   Klebsiella oxytoca NOT DETECTED NOT DETECTED Final   Klebsiella pneumoniae NOT DETECTED NOT DETECTED Final   Proteus species NOT DETECTED NOT DETECTED Final   Serratia marcescens NOT DETECTED NOT DETECTED Final   Haemophilus influenzae NOT DETECTED NOT DETECTED Final   Neisseria meningitidis NOT DETECTED NOT DETECTED Final   Pseudomonas aeruginosa NOT DETECTED NOT DETECTED Final   Candida albicans NOT DETECTED NOT DETECTED Final   Candida glabrata NOT DETECTED NOT DETECTED Final   Candida krusei NOT DETECTED NOT DETECTED Final   Candida parapsilosis NOT DETECTED NOT DETECTED Final   Candida tropicalis NOT DETECTED NOT DETECTED Final    Comment: Performed at [facility], [address], [place], [place] [address]  MRSA PCR Screening     Status: None     Collection Time: 10/10/17  6:15 PM  Result Value Ref Range Status  MRSA by PCR NEGATIVE NEGATIVE Final    Comment:        The GeneXpert MRSA Assay (FDA approved for NASAL specimens only), is one component of a comprehensive MRSA colonization surveillance program. It is not intended to diagnose MRSA infection nor to guide or monitor treatment for MRSA infections. Performed at [facility], [address] [place], [place] [address]          Radiology Studies: Dg Chest Portable 1 View  Result Date: 10/09/2017 CLINICAL DATA:  Shortness of breath.  Low O2 saturation. EXAM: PORTABLE CHEST 1 VIEW COMPARISON:  Chest x-rays dated 08/07/2017 and 04/29/2017 FINDINGS: Tracheostomy tube in place. Heart size and vascularity are normal. Aortic atherosclerosis. New faint area of infiltrate/atelectasis at the right lung base. Left lung is clear. No acute bone abnormality. IMPRESSION: New focal area of infiltrate/atelectasis at the right lung base. Electronically Signed   By: Francene Boyers M.D.   On: 10/09/2017 16:48      Scheduled Meds: . atorvastatin  20 mg Oral Daily  . enoxaparin (LOVENOX) injection  40 mg Subcutaneous Q24H  . furosemide  20 mg Oral Daily  . glipiZIDE  5 mg Oral BID AC  . insulin aspart  0-5 Units Subcutaneous QHS  . insulin aspart  0-9 Units Subcutaneous TID WC  . metoprolol tartrate  75 mg Oral BID  . polyethylene glycol  17 g Oral Daily  . sodium chloride flush  3 mL Intravenous Q12H  . umeclidinium bromide  1 puff Inhalation Daily   Continuous Infusions: . sodium chloride Stopped (10/11/17 1000)  . ceFEPime (MAXIPIME) IV 1 g (10/11/17 0811)  . vancomycin 1,000 mg (10/11/17 0622)     LOS: 2 days    Time spent in minutes: 35    Calvert Cantor, MD [facility] Pager: [email or web address] Password [email or web address] 10/11/2017, 2:12 PM

## 2017-10-12 ENCOUNTER — Inpatient Hospital Stay (HOSPITAL_COMMUNITY): Payer: Medicare Other

## 2017-10-12 LAB — CBC
HCT: 33.4 % — ABNORMAL LOW (ref 39.0–52.0)
Hemoglobin: 10.8 g/dL — ABNORMAL LOW (ref 13.0–17.0)
MCH: 28.5 pg (ref 26.0–34.0)
MCHC: 32.3 g/dL (ref 30.0–36.0)
MCV: 88.1 fL (ref 78.0–100.0)
Platelets: 303 10*3/uL (ref 150–400)
RBC: 3.79 MIL/uL — ABNORMAL LOW (ref 4.22–5.81)
RDW: 13.2 % (ref 11.5–15.5)
WBC: 17.7 10*3/uL — ABNORMAL HIGH (ref 4.0–10.5)

## 2017-10-12 LAB — CULTURE, BLOOD (ROUTINE X 2): SPECIAL REQUESTS: ADEQUATE

## 2017-10-12 LAB — GLUCOSE, CAPILLARY
Glucose-Capillary: 143 mg/dL — ABNORMAL HIGH (ref 70–99)
Glucose-Capillary: 106 mg/dL — ABNORMAL HIGH (ref 70–99)
Glucose-Capillary: 178 mg/dL — ABNORMAL HIGH (ref 70–99)
Glucose-Capillary: 236 mg/dL — ABNORMAL HIGH (ref 70–99)

## 2017-10-12 LAB — LEGIONELLA PNEUMOPHILA SEROGP 1 UR AG: L. PNEUMOPHILA SEROGP 1 UR AG: NEGATIVE

## 2017-10-12 MED ORDER — LEVOFLOXACIN IN D5W 750 MG/150ML IV SOLN
750.0000 mg | INTRAVENOUS | Status: DC
Start: 2017-10-12 — End: 2017-10-14
  Administered 2017-10-12 – 2017-10-14 (×3): 750 mg via INTRAVENOUS
  Filled 2017-10-12 (×3): qty 150

## 2017-10-12 NOTE — Progress Notes (Addendum)
PROGRESS NOTE    Tyrone Cunningham   ZOX:[id number]  DOB: 1955/10/21  DOA: 10/09/2017 PCP: Morrell Riddle, PA-C   Brief Narrative:  Tyrone Cunningham is a 61 y/o with a tracheostomy, HTN, DM2, neuropathy, COPD, OSA, CHF (grade 1 d CHF) who presents for dyspnea, cough and subjective fevers. Pulse ox was 88% on room air.  Temp 100 in ED, green sputum when suctioned, RLL infiltrate, WBC count 22. Admitted for treatment of pneumonia.   Subjective: He feels about the same. Mild dyspnea and mild cough. He was noted to have a fever last night.     Assessment & Plan:   Principal Problem:   HCAP (healthcare-associated pneumonia)/ sepsis - he was initially on Cefepime and Vancomycin -  MRSA PCR neg-   d/c Vancomycin - fevers resolved- WBC count steadily improving - strep pneumo urine antigen negative  - blood cultures > coag neg staph in one bottle- likely contaminant  - sputum culture still pending - Legionella Ag negative - fever spike of 102 noted yesterday -WBC count improved slightly to ~18 but unchanged since then -  repeat CXR today >Increased RIGHT basilar opacity -  will change Cefepime to Levaquin in order to add coverage for atypical organisms- due to chronic trach and many co morbidities, I feel like we should cover for pseudomonas  Active Problems:   [name] with frequent exacerbations   Obstructive sleep apnea   Chronic respiratory failure   Tracheostomy dependent for many years - apparently is on O2 at night- pulse ox is about 90 % on room air - no wheezing on exam to suggest COPD flare - cont Nebs and Incurse Ellipta    Diabetic neuropathy  - cont Ultram and Percocets    DM (diabetes mellitus), type 2 with neurological complications  - on Glucotrol - also placed on SSI for now - sugars stable    Hypokalemia - replaced- Mg- normal  HTN - Metoprolol  Severe protein-calorie malnutrition  Hypoalbuminemia  - Alb 1.9 - dietician consult requested  Pedal edema/ Grade 1  d CHF - has grade 1 d CHF on last ECHO from 2017- on daily Lasix- can continue this as he appears euvolemic  Morbid obesity Body mass index is 40.4 kg/m.   DVT prophylaxis: Lovenox Code Status: Full code Family Communication:  Disposition Plan: home when stable Consultants:   none Procedures:   none Antimicrobials:  Anti-infectives (From admission, onward)   Start     Dose/Rate Route Frequency Ordered Stop   10/12/17 0800  levofloxacin (LEVAQUIN) IVPB 750 mg     750 mg 100 mL/hr over 90 Minutes Intravenous Every 24 hours 10/12/17 0742     10/10/17 0600  vancomycin (VANCOCIN) IVPB 1000 mg/200 mL premix  Status:  Discontinued     1,000 mg 200 mL/hr over 60 Minutes Intravenous Every 12 hours 10/09/17 1849 10/11/17 1426   10/10/17 0130  ceFEPIme (MAXIPIME) 1 g in sodium chloride 0.9 % 100 mL IVPB  Status:  Discontinued     1 g 200 mL/hr over 30 Minutes Intravenous Every 8 hours 10/09/17 1849 10/12/17 0952   10/09/17 1800  vancomycin (VANCOCIN) 2,500 mg in sodium chloride 0.9 % 500 mL IVPB     2,500 mg 250 mL/hr over 120 Minutes Intravenous  Once 10/09/17 1718 10/09/17 1940   10/09/17 1700  ceFEPIme (MAXIPIME) 2 g in sodium chloride 0.9 % 100 mL IVPB     2 g 200 mL/hr over 30 Minutes Intravenous  Once  10/09/17 1657 10/09/17 1838   10/09/17 1700  vancomycin (VANCOCIN) IVPB 1000 mg/200 mL premix  Status:  Discontinued     1,000 mg 200 mL/hr over 60 Minutes Intravenous  Once 10/09/17 1657 10/09/17 1718       Objective: Vitals:   10/12/17 0312 10/12/17 0749 10/12/17 0757 10/12/17 0924  BP:  (!) 159/78    Pulse: 76 88 82 89  Resp: 20 (!) 26 16 18   Temp:  98.6 F (37 C)    TempSrc:  Oral    SpO2: 95% 100% 99% 98%  Weight:      Height:        Intake/Output Summary (Last 24 hours) at 10/12/2017 1147 Last data filed at 10/12/2017 1037 Gross per 24 hour  Intake 1010 ml  Output 1600 ml  Net -590 ml   Filed Weights   10/09/17 2027  Weight: 117 kg     Examination: General exam: Appears comfortable  HEENT: PERRLA, oral mucosa moist, no sclera icterus or thrush- Tracheostomy present Respiratory system: mild rhonchi today. Respiratory effort normal. Cardiovascular system: S1 & S2 heard, RRR.   Gastrointestinal system: Abdomen soft, non-tender, nondistended. Normal bowel sound. No organomegaly Central nervous system: Alert and oriented. No focal neurological deficits. Extremities: No cyanosis, clubbing or edema- mild pedal edema notes Skin: No rashes or ulcers Psychiatry:  Mood & affect appropriate.   Data Reviewed: I have personally reviewed following labs and imaging studies  CBC: Recent Labs  Lab 10/09/17 1734 10/10/17 0311 10/11/17 0219 10/12/17 0607  WBC 22.6* 22.0* 18.7* 17.7*  NEUTROABS 18.2*  --   --   --   HGB 11.9* 11.5* 10.4* 10.8*  HCT 37.7* 35.9* 32.6* 33.4*  MCV 89.3 89.1 89.8 88.1  PLT 316 278 247 303   Basic Metabolic Panel: Recent Labs  Lab 10/09/17 1734 10/10/17 0311 10/10/17 0836 10/11/17 0219  NA 138 139  --  136  K 3.2* 3.3*  --  3.5  CL 99 100  --  97*  CO2 27 27  --  29  GLUCOSE 124* 88  --  208*  BUN 14 15  --  14  CREATININE 1.11 1.01  --  1.14  CALCIUM 8.8* 8.5*  --  8.5*  MG  --   --  1.9  --    GFR: Estimated Creatinine Clearance: 80.1 mL/min (by C-G formula based on SCr of 1.14 mg/dL). Liver Function Tests: Recent Labs  Lab 10/09/17 1734 10/10/17 0311  AST 23 28  ALT 28 29  ALKPHOS 132* 119  BILITOT 0.6 0.6  PROT 7.1 6.5  ALBUMIN 2.3* 1.9*   No results for input(s): LIPASE, AMYLASE in the last 168 hours. No results for input(s): AMMONIA in the last 168 hours. Coagulation Profile: No results for input(s): INR, PROTIME in the last 168 hours. Cardiac Enzymes: No results for input(s): CKTOTAL, CKMB, CKMBINDEX, TROPONINI in the last 168 hours. BNP (last 3 results) No results for input(s): PROBNP in the last 8760 hours. HbA1C: No results for input(s): HGBA1C in the last 72  hours. CBG: Recent Labs  Lab 10/11/17 1137 10/11/17 1653 10/11/17 2137 10/12/17 0746 10/12/17 1131  GLUCAP 144* 214* 157* 143* 106*   Lipid Profile: No results for input(s): CHOL, HDL, LDLCALC, TRIG, CHOLHDL, LDLDIRECT in the last 72 hours. Thyroid Function Tests: No results for input(s): TSH, T4TOTAL, FREET4, T3FREE, THYROIDAB in the last 72 hours. Anemia Panel: No results for input(s): VITAMINB12, FOLATE, FERRITIN, TIBC, IRON, RETICCTPCT in the last 72  hours. Urine analysis:    Component Value Date/Time   COLORURINE YELLOW 10/10/2017 2238   APPEARANCEUR CLOUDY (A) 10/10/2017 2238   LABSPEC 1.019 10/10/2017 2238   PHURINE 6.0 10/10/2017 2238   GLUCOSEU 150 (A) 10/10/2017 2238   HGBUR SMALL (A) 10/10/2017 2238   BILIRUBINUR NEGATIVE 10/10/2017 2238   BILIRUBINUR neg 01/03/2014 1527   KETONESUR NEGATIVE 10/10/2017 2238   PROTEINUR >=300 (A) 10/10/2017 2238   UROBILINOGEN 1.0 01/03/2014 1814   NITRITE NEGATIVE 10/10/2017 2238   LEUKOCYTESUR NEGATIVE 10/10/2017 2238   Sepsis Labs: @LABRCNTIP (procalcitonin:4,lacticidven:4) ) Recent Results (from the past 240 hour(s))  Blood Culture (routine x 2)     Status: None (Preliminary result)   Collection Time: 10/09/17  4:54 PM  Result Value Ref Range Status   Specimen Description BLOOD BLOOD LEFT HAND  Final   Special Requests   Final    BOTTLES DRAWN AEROBIC AND ANAEROBIC Blood Culture adequate volume   Culture   Final    NO GROWTH 3 DAYS Performed at [facility], [address], [place], [place] [address]    Report Status PENDING  Incomplete  Blood Culture (routine x 2)     Status: Abnormal   Collection Time: 10/09/17  4:59 PM  Result Value Ref Range Status   Specimen Description BLOOD LEFT ANTECUBITAL  Final   Special Requests   Final    BOTTLES DRAWN AEROBIC AND ANAEROBIC Blood Culture adequate volume   Culture  Setup Time   Final    GRAM POSITIVE COCCI IN CLUSTERS IN BOTH AEROBIC AND ANAEROBIC  BOTTLES CRITICAL RESULT CALLED TO, READ BACK BY AND VERIFIED WITH: R. RUMBARGER PHARMD, AT 1530 10/10/17 BY D. VANHOOK    Culture (A)  Final    STAPHYLOCOCCUS SPECIES (COAGULASE NEGATIVE) THE SIGNIFICANCE OF ISOLATING THIS ORGANISM FROM A SINGLE SET OF BLOOD CULTURES WHEN MULTIPLE SETS ARE DRAWN IS UNCERTAIN. PLEASE NOTIFY THE MICROBIOLOGY DEPARTMENT WITHIN ONE WEEK IF SPECIATION AND SENSITIVITIES ARE REQUIRED. Performed at [facility], [address] [place], [place] [address]    Report Status 10/12/2017 FINAL  Final  Blood Culture ID Panel (Reflexed)     Status: Abnormal   Collection Time: 10/09/17  4:59 PM  Result Value Ref Range Status   Enterococcus species NOT DETECTED NOT DETECTED Final   Listeria monocytogenes NOT DETECTED NOT DETECTED Final   Staphylococcus species DETECTED (A) NOT DETECTED Final    Comment: Methicillin (oxacillin) resistant coagulase negative staphylococcus. Possible blood culture contaminant (unless isolated from more than one blood culture draw or clinical case suggests pathogenicity). No antibiotic treatment is indicated for blood  culture contaminants. CRITICAL RESULT CALLED TO, READ BACK BY AND VERIFIED WITH: R. RUMBARGER PHARMD, AT 1530 10/10/17 BY D. VANHOOK    Staphylococcus aureus NOT DETECTED NOT DETECTED Final   Methicillin resistance DETECTED (A) NOT DETECTED Final    Comment: CRITICAL RESULT CALLED TO, READ BACK BY AND VERIFIED WITH: R. RUMBARGER PHARMD, AT 1530 10/10/17 BY D. VANHOOK    Streptococcus species NOT DETECTED NOT DETECTED Final   Streptococcus agalactiae NOT DETECTED NOT DETECTED Final   Streptococcus pneumoniae NOT DETECTED NOT DETECTED Final   Streptococcus pyogenes NOT DETECTED NOT DETECTED Final   Acinetobacter baumannii NOT DETECTED NOT DETECTED Final   Enterobacteriaceae species NOT DETECTED NOT DETECTED Final   Enterobacter cloacae complex NOT DETECTED NOT DETECTED Final   Escherichia coli NOT DETECTED NOT DETECTED Final    Klebsiella oxytoca NOT DETECTED NOT DETECTED Final  Klebsiella pneumoniae NOT DETECTED NOT DETECTED Final   Proteus species NOT DETECTED NOT DETECTED Final   Serratia marcescens NOT DETECTED NOT DETECTED Final   Haemophilus influenzae NOT DETECTED NOT DETECTED Final   Neisseria meningitidis NOT DETECTED NOT DETECTED Final   Pseudomonas aeruginosa NOT DETECTED NOT DETECTED Final   Candida albicans NOT DETECTED NOT DETECTED Final   Candida glabrata NOT DETECTED NOT DETECTED Final   Candida krusei NOT DETECTED NOT DETECTED Final   Candida parapsilosis NOT DETECTED NOT DETECTED Final   Candida tropicalis NOT DETECTED NOT DETECTED Final    Comment: Performed at [facility], [address] [place], [place] [address]  MRSA PCR Screening     Status: None   Collection Time: 10/10/17  6:15 PM  Result Value Ref Range Status   MRSA by PCR NEGATIVE NEGATIVE Final    Comment:        The GeneXpert MRSA Assay (FDA approved for NASAL specimens only), is one component of a comprehensive MRSA colonization surveillance program. It is not intended to diagnose MRSA infection nor to guide or monitor treatment for MRSA infections. Performed at [facility], [address] [place], [place] [address]          Radiology Studies: Dg Chest 2 View  Result Date: 10/12/2017 CLINICAL DATA:  Shortness of breath EXAM: CHEST - 2 VIEW COMPARISON:  10/09/2017 and prior chest radiographs FINDINGS: Cardiomediastinal silhouette is unchanged. A tracheostomy tube is again identified. Increased RIGHT basilar opacity noted. No pneumothorax or definite pleural effusion. No acute bony abnormalities are present. IMPRESSION: Increased RIGHT basilar opacity - question atelectasis versus airspace disease/pneumonia. Electronically Signed   By: Harmon PierJeffrey  Hu M.D.   On: 10/12/2017 08:53      Scheduled Meds: . atorvastatin  20 mg Oral Daily  . enoxaparin (LOVENOX) injection  40 mg Subcutaneous Q24H  .  furosemide  20 mg Oral Daily  . glipiZIDE  5 mg Oral BID AC  . insulin aspart  0-5 Units Subcutaneous QHS  . insulin aspart  0-9 Units Subcutaneous TID WC  . metoprolol tartrate  75 mg Oral BID  . polyethylene glycol  17 g Oral Daily  . sodium chloride flush  3 mL Intravenous Q12H  . umeclidinium bromide  1 puff Inhalation Daily   Continuous Infusions: . sodium chloride Stopped (10/11/17 1000)  . levofloxacin (LEVAQUIN) IV Stopped (10/12/17 0958)     LOS: 3 days    Time spent in minutes: 35    Calvert CantorSaima Ahrianna Siglin, MD [facility] Pager: [email or web address] Password [email or web address] 10/12/2017, 11:47 AM

## 2017-10-12 NOTE — Progress Notes (Signed)
Was the fall witnessed: no  Patient condition before and after the fall: Before pt was alert and oriented and sitting on side of bed eating lunch w/ bed alarm on. After, pt still alert and oriented. He tried to [facility] for [facility] from bed without pressing his call light and waiting for assistance. Bed alarm went off.  Pt found by RN and Inetta Fermoina (environmental staff). Pt was on floor. He landed on right hip. Pt denied having localized pain to that site. Pt bleeding from left 2nd in fingernail.  RN along with Gloriajean DellNadine RN, Lyla Sonarrie CNA, and FairburyVivian CNA assisted cleaning pt as he had a BM on floor and then transferred him back to bed. Pt able to bear weight once transferred to standing. RN assessed pt's right leg. He was able to lift and bend it without pain. Pt placed and bed. Bed alarm reset (not to out of bed setting but side of bed setting). Pt re-educated about using call light. Post huddle completed with staff.   Patient's reaction to the fall: Pt calm and stated he was embarrassed that he had not asked for help.   Name of the doctor that was notified including date and time: Rizwan MD at 1310  Any interventions and vital signs: Vital signs taken, pt cleaned up post BM, bandaid placed on fingernail that was bleeding, right leg assessed for injury, pt declining pain at this time. Bed alarm set.   Low setting bed and fall mats being ordered.   RN spoke to [employer] Sinkule RN and when bed is available pt was be transferred to room closer to nurses station.

## 2017-10-12 NOTE — Progress Notes (Signed)
Tyrone Cunningham chk done. Pt is sitting on the side of the bed eating lunch. Pt removed the inner cannula to trach and refused to keep inner cannula in. Pt also at this time removed O2 from trach collar. Pt in no distress eating lunch. Vitals stable. RT will continue to follow. RN at the bedside and aware

## 2017-10-12 NOTE — Progress Notes (Signed)
Pharmacy Antibiotic Note  Tyrone BaileyMark Cunningham is a 62 y.o. male with chronic tracheostomy admitted on 10/09/2017 with pneumonia.    Plan: Continue cefepime 1g q8h Add levofloxacin 750 mg iv q24h  DC one of the above abx as patient seemed to be improving on cefepime alone Monitor lot, renal fx  Height: 5\' 7"  (170.2 cm) Weight: 257 lb 15 oz (117 kg) IBW/kg (Calculated) : 66.1  Temp (24hrs), Avg:100.2 F (37.9 C), Min:99.3 F (37.4 C), Max:102 F (38.9 C)  Recent Labs  Lab 10/09/17 1719 10/09/17 1734 10/09/17 2117 10/10/17 0005 10/10/17 0311 10/11/17 0219 10/12/17 0607  WBC  --  22.6*  --   --  22.0* 18.7* 17.7*  CREATININE  --  1.11  --   --  1.01 1.14  --   LATICACIDVEN 2.13*  --  0.9 1.2  --   --   --     Estimated Creatinine Clearance: 80.1 mL/min (by C-G formula based on SCr of 1.14 mg/dL).    No Known Allergies  Isaac BlissMichael Davionte Lusby, PharmD, BCPS, BCCCP Clinical Pharmacist [phone]  Please check AMION for all [facility] numbers  10/12/2017 7:43 AM

## 2017-10-12 NOTE — Progress Notes (Signed)
Rt at bedside to do trach care.  Pt does not have an inner cannula. Tried to place inner cannula but pt refused and stated he doesn't use an inner canula.  [facility]urator.

## 2017-10-12 NOTE — Progress Notes (Signed)
RT came to get sputum sample from trach. Pt still refuses to wear an inner cannula. RT sxn trach and got sputum sample in return. Sputum sample given to RN. Pt very demanding to use a trach cleaning brush from a trach cleaning kit to get sputum out versus trach sxn. Pt states he does it at home. Pt refusing to wear ATC at this time sitting in the chair and refuses to be sxn or trach care done at this time. RN at the bedside. Pt became angry throwing call bell. RT instructed the importance of wearing atc and proper trach care. Pt noncompliant at this time.

## 2017-10-12 NOTE — Progress Notes (Signed)
Pt stated he was mad he was not receiving his pain medication scheduled. RN explained that they are ordered PRN. Pt stated that he did not care and that they should be. RN stated for pt to discuss this with MD during rounds. RN assessed pt's pain and went to get medication.   RN brought in 5mg  of oxycodone. Pt refused and stated he takes 10-325mg  of percocet at home. RN verified with PTA med documentation. With both orders in, RN administered both to pt.   After medication administration, RN and CNA switched out pt's bed for low bed and placed fall mats at sides. Bed alarm set. RN explained to pt reasoning for precautions due to his fall today. Pt verbalized understanding.

## 2017-10-12 NOTE — Care Management Important Message (Signed)
Important Message  Patient Details  Name: Tyrone BaileyMark Cunningham MRN: [id number] Date of Birth: 07/05/1955   Medicare Important Message Given:  Yes    Shameeka Silliman Stefan ChurchBratton 10/12/2017, 3:47 PM

## 2017-10-13 ENCOUNTER — Ambulatory Visit: Payer: Medicare Other | Admitting: Physician Assistant

## 2017-10-13 LAB — BASIC METABOLIC PANEL
Anion gap: 10 (ref 5–15)
BUN: 14 mg/dL (ref 8–23)
CALCIUM: 8.7 mg/dL — AB (ref 8.9–10.3)
CO2: 33 mmol/L — AB (ref 22–32)
CREATININE: 1.1 mg/dL (ref 0.61–1.24)
Chloride: 96 mmol/L — ABNORMAL LOW (ref 98–111)
GFR calc non Af Amer: >60 mL/min (ref >60)
GLUCOSE: 205 mg/dL — AB (ref 70–99)
Potassium: 3 mmol/L — ABNORMAL LOW (ref 3.5–5.1)
Sodium: 139 mmol/L (ref 135–145)

## 2017-10-13 LAB — CBC
HCT: 32.4 % — ABNORMAL LOW (ref 39.0–52.0)
Hemoglobin: 10.4 g/dL — ABNORMAL LOW (ref 13.0–17.0)
MCH: 28.7 pg (ref 26.0–34.0)
MCHC: 32.1 g/dL (ref 30.0–36.0)
MCV: 89.3 fL (ref 78.0–100.0)
Platelets: 313 10*3/uL (ref 150–400)
RBC: 3.63 MIL/uL — ABNORMAL LOW (ref 4.22–5.81)
RDW: 13.2 % (ref 11.5–15.5)
WBC: 14.6 10*3/uL — ABNORMAL HIGH (ref 4.0–10.5)

## 2017-10-13 LAB — GLUCOSE, CAPILLARY
GLUCOSE-CAPILLARY: 207 mg/dL — AB (ref 70–99)
GLUCOSE-CAPILLARY: 174 mg/dL — AB (ref 70–99)
Glucose-Capillary: 97 mg/dL (ref 70–99)
Glucose-Capillary: 176 mg/dL — ABNORMAL HIGH (ref 70–99)

## 2017-10-13 LAB — MAGNESIUM: MAGNESIUM: 2 mg/dL (ref 1.7–2.4)

## 2017-10-13 MED ORDER — POTASSIUM CHLORIDE CRYS ER 20 MEQ PO TBCR
40.0000 meq | EXTENDED_RELEASE_TABLET | Freq: Once | ORAL | Status: AC
  Administered 2017-10-13: 40 meq via ORAL
  Filled 2017-10-13: qty 2

## 2017-10-13 MED ORDER — POTASSIUM CHLORIDE CRYS ER 20 MEQ PO TBCR
40.0000 meq | EXTENDED_RELEASE_TABLET | Freq: Every day | ORAL | Status: DC
  Administered 2017-10-13 – 2017-10-15 (×3): 40 meq via ORAL
  Filled 2017-10-13 (×3): qty 2

## 2017-10-13 NOTE — Progress Notes (Signed)
PROGRESS NOTE    Tyrone Cunningham   ZOX:[id number]  DOB: 1956-01-05  DOA: 10/09/2017 PCP: Morrell Riddle, PA-C   Brief Narrative:  Tyrone Cunningham is a 62 y/o with a tracheostomy, HTN, DM2, neuropathy, COPD, OSA, CHF (grade 1 d CHF) who presents for dyspnea, cough and subjective fevers. Pulse ox was 88% on room air.  Temp 100 in ED, green sputum when suctioned, RLL infiltrate, WBC count 22. Admitted for treatment of pneumonia.   Subjective: No recurrence of fevers. Mild cough.    Assessment & Plan:   Principal Problem:   HCAP (healthcare-associated pneumonia)/ sepsis - he was initially on Cefepime and Vancomycin -  MRSA PCR neg-   d/c Vancomycin - fevers resolved- WBC count steadily improving - strep pneumo urine antigen negative  - blood cultures > coag neg staph in one bottle- likely contaminant  - sputum culture > abundant WBC, no organisms - Legionella Ag negative - 8/11 fever spike of 102 noted   -WBC count improved slightly to ~18 but unchanged since then-  repeat CXR   >Increased RIGHT basilar opacity -  8/12>> will change Cefepime to Levaquin in order to add coverage for atypical organisms- due to chronic trach, severe COPD and many co morbidities, I feel like we should cover for pseudomonas - 8 /13- no recurrence of fevers- leukocytosis improving   Active Problems:   [name] with frequent exacerbations   Obstructive sleep apnea   Chronic respiratory failure   Tracheostomy dependent for many years - apparently is on O2 at night- pulse ox is about 90 % on room air - no wheezing on exam to suggest COPD flare - cont Nebs and Incurse Ellipta    Diabetic neuropathy  - cont Ultram and Percocets    DM (diabetes mellitus), type 2 with neurological complications  - on Glucotrol - also placed on SSI for now - sugars stable    Hypokalemia - Mg- normal - have added Kdur 40 mg daily as he is on daily Lasix which may be causing K to be persistently low  HTN -  Metoprolol  Severe protein-calorie malnutrition  Hypoalbuminemia  - Alb 1.9 - dietician consult requested  Pedal edema/ Grade 1 d CHF - has grade 1 d CHF on last ECHO from 2017- on daily Lasix- can continue this as he appears euvolemic  Morbid obesity Body mass index is 40.4 kg/m.   DVT prophylaxis: Lovenox Code Status: Full code Family Communication:  Disposition Plan: home in 1-2 days Consultants:   none Procedures:   none Antimicrobials:  Anti-infectives (From admission, onward)   Start     Dose/Rate Route Frequency Ordered Stop   10/12/17 0800  levofloxacin (LEVAQUIN) IVPB 750 mg     750 mg 100 mL/hr over 90 Minutes Intravenous Every 24 hours 10/12/17 0742     10/10/17 0600  vancomycin (VANCOCIN) IVPB 1000 mg/200 mL premix  Status:  Discontinued     1,000 mg 200 mL/hr over 60 Minutes Intravenous Every 12 hours 10/09/17 1849 10/11/17 1426   10/10/17 0130  ceFEPIme (MAXIPIME) 1 g in sodium chloride 0.9 % 100 mL IVPB  Status:  Discontinued     1 g 200 mL/hr over 30 Minutes Intravenous Every 8 hours 10/09/17 1849 10/12/17 0952   10/09/17 1800  vancomycin (VANCOCIN) 2,500 mg in sodium chloride 0.9 % 500 mL IVPB     2,500 mg 250 mL/hr over 120 Minutes Intravenous  Once 10/09/17 1718 10/09/17 1940   10/09/17 1700  ceFEPIme (MAXIPIME) 2 g in sodium chloride 0.9 % 100 mL IVPB     2 g 200 mL/hr over 30 Minutes Intravenous  Once 10/09/17 1657 10/09/17 1838   10/09/17 1700  vancomycin (VANCOCIN) IVPB 1000 mg/200 mL premix  Status:  Discontinued     1,000 mg 200 mL/hr over 60 Minutes Intravenous  Once 10/09/17 1657 10/09/17 1718       Objective: Vitals:   10/13/17 0825 10/13/17 0914 10/13/17 1159 10/13/17 1330  BP: (!) 163/125     Pulse: 91 75 71   Resp: (!) 30 16 18    Temp: 98.4 F (36.9 C)     TempSrc: Oral     SpO2: (!) 72% 97% 100% 100%  Weight:      Height:        Intake/Output Summary (Last 24 hours) at 10/13/2017 1411 Last data filed at 10/13/2017  1120 Gross per 24 hour  Intake 693 ml  Output 2050 ml  Net -1357 ml   Filed Weights   10/09/17 2027  Weight: 117 kg    Examination: General exam: Appears comfortable  HEENT: PERRLA, oral mucosa moist, no sclera icterus or thrush- Tracheostomy present Respiratory system: lung clear to auscultation today- Respiratory effort normal. Cardiovascular system: S1 & S2 heard, RRR.   Gastrointestinal system: Abdomen soft, non-tender, nondistended. Normal bowel sound. No organomegaly Central nervous system: Alert and oriented. No focal neurological deficits. Extremities: No cyanosis, clubbing or edema- mild pedal edema noted Skin: No rashes or ulcers Psychiatry:  Mood & affect appropriate.   Data Reviewed: I have personally reviewed following labs and imaging studies  CBC: Recent Labs  Lab 10/09/17 1734 10/10/17 0311 10/11/17 0219 10/12/17 0607 10/13/17 0210  WBC 22.6* 22.0* 18.7* 17.7* 14.6*  NEUTROABS 18.2*  --   --   --   --   HGB 11.9* 11.5* 10.4* 10.8* 10.4*  HCT 37.7* 35.9* 32.6* 33.4* 32.4*  MCV 89.3 89.1 89.8 88.1 89.3  PLT 316 278 247 303 313   Basic Metabolic Panel: Recent Labs  Lab 10/09/17 1734 10/10/17 0311 10/10/17 0836 10/11/17 0219 10/13/17 0210  NA 138 139  --  136 139  K 3.2* 3.3*  --  3.5 3.0*  CL 99 100  --  97* 96*  CO2 27 27  --  29 33*  GLUCOSE 124* 88  --  208* 205*  BUN 14 15  --  14 14  CREATININE 1.11 1.01  --  1.14 1.10  CALCIUM 8.8* 8.5*  --  8.5* 8.7*  MG  --   --  1.9  --  2.0   GFR: Estimated Creatinine Clearance: 83 mL/min (by C-G formula based on SCr of 1.1 mg/dL). Liver Function Tests: Recent Labs  Lab 10/09/17 1734 10/10/17 0311  AST 23 28  ALT 28 29  ALKPHOS 132* 119  BILITOT 0.6 0.6  PROT 7.1 6.5  ALBUMIN 2.3* 1.9*   No results for input(s): LIPASE, AMYLASE in the last 168 hours. No results for input(s): AMMONIA in the last 168 hours. Coagulation Profile: No results for input(s): INR, PROTIME in the last 168  hours. Cardiac Enzymes: No results for input(s): CKTOTAL, CKMB, CKMBINDEX, TROPONINI in the last 168 hours. BNP (last 3 results) No results for input(s): PROBNP in the last 8760 hours. HbA1C: No results for input(s): HGBA1C in the last 72 hours. CBG: Recent Labs  Lab 10/12/17 1131 10/12/17 1720 10/12/17 2019 10/13/17 0728 10/13/17 1156  GLUCAP 106* 178* 236* 97 176*  Lipid Profile: No results for input(s): CHOL, HDL, LDLCALC, TRIG, CHOLHDL, LDLDIRECT in the last 72 hours. Thyroid Function Tests: No results for input(s): TSH, T4TOTAL, FREET4, T3FREE, THYROIDAB in the last 72 hours. Anemia Panel: No results for input(s): VITAMINB12, FOLATE, FERRITIN, TIBC, IRON, RETICCTPCT in the last 72 hours. Urine analysis:    Component Value Date/Time   COLORURINE YELLOW 10/10/2017 2238   APPEARANCEUR CLOUDY (A) 10/10/2017 2238   LABSPEC 1.019 10/10/2017 2238   PHURINE 6.0 10/10/2017 2238   GLUCOSEU 150 (A) 10/10/2017 2238   HGBUR SMALL (A) 10/10/2017 2238   BILIRUBINUR NEGATIVE 10/10/2017 2238   BILIRUBINUR neg 01/03/2014 1527   KETONESUR NEGATIVE 10/10/2017 2238   PROTEINUR >=300 (A) 10/10/2017 2238   UROBILINOGEN 1.0 01/03/2014 1814   NITRITE NEGATIVE 10/10/2017 2238   LEUKOCYTESUR NEGATIVE 10/10/2017 2238   Sepsis Labs: @LABRCNTIP (procalcitonin:4,lacticidven:4) ) Recent Results (from the past 240 hour(s))  Blood Culture (routine x 2)     Status: None (Preliminary result)   Collection Time: 10/09/17  4:54 PM  Result Value Ref Range Status   Specimen Description BLOOD BLOOD LEFT HAND  Final   Special Requests   Final    BOTTLES DRAWN AEROBIC AND ANAEROBIC Blood Culture adequate volume   Culture   Final    NO GROWTH 4 DAYS Performed at [facility], [address] [place], [place] [address]    Report Status PENDING  Incomplete  Blood Culture (routine x 2)     Status: Abnormal   Collection Time: 10/09/17  4:59 PM  Result Value Ref Range Status   Specimen Description  BLOOD LEFT ANTECUBITAL  Final   Special Requests   Final    BOTTLES DRAWN AEROBIC AND ANAEROBIC Blood Culture adequate volume   Culture  Setup Time   Final    GRAM POSITIVE COCCI IN CLUSTERS IN BOTH AEROBIC AND ANAEROBIC BOTTLES CRITICAL RESULT CALLED TO, READ BACK BY AND VERIFIED WITH: R. RUMBARGER PHARMD, AT 1530 10/10/17 BY D. VANHOOK    Culture (A)  Final    STAPHYLOCOCCUS SPECIES (COAGULASE NEGATIVE) THE SIGNIFICANCE OF ISOLATING THIS ORGANISM FROM A SINGLE SET OF BLOOD CULTURES WHEN MULTIPLE SETS ARE DRAWN IS UNCERTAIN. PLEASE NOTIFY THE MICROBIOLOGY DEPARTMENT WITHIN ONE WEEK IF SPECIATION AND SENSITIVITIES ARE REQUIRED. Performed at [facility], [address] [place], [place] [address]    Report Status 10/12/2017 FINAL  Final  Blood Culture ID Panel (Reflexed)     Status: Abnormal   Collection Time: 10/09/17  4:59 PM  Result Value Ref Range Status   Enterococcus species NOT DETECTED NOT DETECTED Final   Listeria monocytogenes NOT DETECTED NOT DETECTED Final   Staphylococcus species DETECTED (A) NOT DETECTED Final    Comment: Methicillin (oxacillin) resistant coagulase negative staphylococcus. Possible blood culture contaminant (unless isolated from more than one blood culture draw or clinical case suggests pathogenicity). No antibiotic treatment is indicated for blood  culture contaminants. CRITICAL RESULT CALLED TO, READ BACK BY AND VERIFIED WITH: R. RUMBARGER PHARMD, AT 1530 10/10/17 BY D. VANHOOK    Staphylococcus aureus NOT DETECTED NOT DETECTED Final   Methicillin resistance DETECTED (A) NOT DETECTED Final    Comment: CRITICAL RESULT CALLED TO, READ BACK BY AND VERIFIED WITH: R. RUMBARGER PHARMD, AT 1530 10/10/17 BY D. VANHOOK    Streptococcus species NOT DETECTED NOT DETECTED Final   Streptococcus agalactiae NOT DETECTED NOT DETECTED Final   Streptococcus pneumoniae NOT DETECTED NOT DETECTED Final   Streptococcus pyogenes NOT DETECTED NOT DETECTED  Final    Acinetobacter baumannii NOT DETECTED NOT DETECTED Final   Enterobacteriaceae species NOT DETECTED NOT DETECTED Final   Enterobacter cloacae complex NOT DETECTED NOT DETECTED Final   Escherichia coli NOT DETECTED NOT DETECTED Final   Klebsiella oxytoca NOT DETECTED NOT DETECTED Final   Klebsiella pneumoniae NOT DETECTED NOT DETECTED Final   Proteus species NOT DETECTED NOT DETECTED Final   Serratia marcescens NOT DETECTED NOT DETECTED Final   Haemophilus influenzae NOT DETECTED NOT DETECTED Final   Neisseria meningitidis NOT DETECTED NOT DETECTED Final   Pseudomonas aeruginosa NOT DETECTED NOT DETECTED Final   Candida albicans NOT DETECTED NOT DETECTED Final   Candida glabrata NOT DETECTED NOT DETECTED Final   Candida krusei NOT DETECTED NOT DETECTED Final   Candida parapsilosis NOT DETECTED NOT DETECTED Final   Candida tropicalis NOT DETECTED NOT DETECTED Final    Comment: Performed at [facility], [address] [place], [place] [address]  MRSA PCR Screening     Status: None   Collection Time: 10/10/17  6:15 PM  Result Value Ref Range Status   MRSA by PCR NEGATIVE NEGATIVE Final    Comment:        The GeneXpert MRSA Assay (FDA approved for NASAL specimens only), is one component of a comprehensive MRSA colonization surveillance program. It is not intended to diagnose MRSA infection nor to guide or monitor treatment for MRSA infections. Performed at [facility], [address] [place], [place] [address]   Culture, respiratory (non-expectorated)     Status: None (Preliminary result)   Collection Time: 10/12/17  4:37 PM  Result Value Ref Range Status   Specimen Description TRACHEAL ASPIRATE  Final   Special Requests NONE  Final   Gram Stain   Final    ABUNDANT WBC PRESENT, PREDOMINANTLY PMN NO ORGANISMS SEEN    Culture   Final    NO GROWTH < 12 HOURS Performed at [facility], [address], [place], [place] [address]    Report Status PENDING   Incomplete         Radiology Studies: Dg Chest 2 View  Result Date: 10/12/2017 CLINICAL DATA:  Shortness of breath EXAM: CHEST - 2 VIEW COMPARISON:  10/09/2017 and prior chest radiographs FINDINGS: Cardiomediastinal silhouette is unchanged. A tracheostomy tube is again identified. Increased RIGHT basilar opacity noted. No pneumothorax or definite pleural effusion. No acute bony abnormalities are present. IMPRESSION: Increased RIGHT basilar opacity - question atelectasis versus airspace disease/pneumonia. Electronically Signed   By: Harmon Pier M.D.   On: 10/12/2017 08:53      Scheduled Meds: . atorvastatin  20 mg Oral Daily  . enoxaparin (LOVENOX) injection  40 mg Subcutaneous Q24H  . furosemide  20 mg Oral Daily  . glipiZIDE  5 mg Oral BID AC  . insulin aspart  0-5 Units Subcutaneous QHS  . insulin aspart  0-9 Units Subcutaneous TID WC  . metoprolol tartrate  75 mg Oral BID  . polyethylene glycol  17 g Oral Daily  . sodium chloride flush  3 mL Intravenous Q12H  . umeclidinium bromide  1 puff Inhalation Daily   Continuous Infusions: . sodium chloride Stopped (10/11/17 1000)  . levofloxacin (LEVAQUIN) IV 750 mg (10/13/17 0802)     LOS: 4 days    Time spent in minutes: 35    Calvert Cantor, MD [facility] Pager: [email or web address] Password [facility] 10/13/2017, 2:11 PM

## 2017-10-14 DIAGNOSIS — E43 Unspecified severe protein-calorie malnutrition: Secondary | ICD-10-CM

## 2017-10-14 DIAGNOSIS — J449 Chronic obstructive pulmonary disease, unspecified: Secondary | ICD-10-CM

## 2017-10-14 DIAGNOSIS — E1149 Type 2 diabetes mellitus with other diabetic neurological complication: Secondary | ICD-10-CM

## 2017-10-14 DIAGNOSIS — Z930 Tracheostomy status: Secondary | ICD-10-CM

## 2017-10-14 DIAGNOSIS — J189 Pneumonia, unspecified organism: Secondary | ICD-10-CM

## 2017-10-14 LAB — GLUCOSE, CAPILLARY
GLUCOSE-CAPILLARY: 124 mg/dL — AB (ref 70–99)
Glucose-Capillary: 155 mg/dL — ABNORMAL HIGH (ref 70–99)
Glucose-Capillary: 151 mg/dL — ABNORMAL HIGH (ref 70–99)
Glucose-Capillary: 171 mg/dL — ABNORMAL HIGH (ref 70–99)

## 2017-10-14 LAB — CULTURE, BLOOD (ROUTINE X 2)
Culture: NO GROWTH
Special Requests: ADEQUATE

## 2017-10-14 MED ORDER — ALBUTEROL SULFATE (2.5 MG/3ML) 0.083% IN NEBU
2.5000 mg | INHALATION_SOLUTION | RESPIRATORY_TRACT | Status: DC | PRN
  Administered 2017-10-14 – 2017-10-15 (×2): 2.5 mg via RESPIRATORY_TRACT
  Filled 2017-10-14 (×2): qty 3

## 2017-10-14 MED ORDER — LEVOFLOXACIN 750 MG PO TABS
750.0000 mg | ORAL_TABLET | Freq: Every day | ORAL | Status: DC
  Administered 2017-10-15: 750 mg via ORAL
  Filled 2017-10-14: qty 1

## 2017-10-14 NOTE — Care Management Note (Signed)
Case Management Note  Patient Details  Name: Tyrone Cunningham MRN: [id number] Date of Birth: 08/27/1955  Subjective/Objective:  From a hotel- your choice extended stay [address] [place] [id number], [facility] ,uses it at night 10 liters, he also has a neb machine, presents with HCAP, has chronic tracheostomy, he does his own trach care. He states his brother Tyrone Cunningham will transport him home at discharge [phone]. NCM contacted Tyrone Cunningham with [facility] and informed him that patient will need oxygen tank at discharge to be brought up to room.  Tyrone Cunningham states he will take care of this                  Action/Plan[facility]  Expected Discharge Date:  10/12/17               Expected Discharge Plan:  Home w Home Health Services  In-House Referral:     Discharge planning Services  CM Consult  Post Acute Care Choice:  Home Health Choice offered to:  Patient  DME Arranged:    DME Agency:     HH Arranged:  PT HH Agency:  [facility]  Status of Service:  Completed, signed off  If discussed at Long Length of Stay Meetings, dates discussed:    Additional Comments:  Tyrone Cunningham, Tyrone Syme Clinton, RN 10/14/2017, 4:04 PM

## 2017-10-14 NOTE — Evaluation (Signed)
Physical Therapy Evaluation Patient Details Name: Tyrone Cunningham MRN: [id number] DOB: 11/24/1955 Today's Date: 10/14/2017   History of Present Illness  62 y.o. M with significant finding of PNA with sepsis upon admission from a motel where he stays with a roommate.  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  Has no kitchen at home and is living with basic amenities.  PMHx: OSA requiring trach, HTN, HLD, DM, COPD, CHF, LLE cellulitis.    Clinical Impression  Pt was seen for mobility from side of bed where he is keeping himself upright and has O2 sat monitor in place.  He is using his hand to cover trach for speech, and reports he was unable to make a PM valve work for him in the past.  Pt has no recall of what the issues were that caused the PM to be unworkable.  Follow acutely for strengthening and control of standing/transfers as pt is not likely to qualify for HHPT in his current living situation.  Has O2 sats with O2 in place of 97 to 100% with no SOB evident at evaluation.      Follow Up Recommendations Home health PT;Supervision for mobility/OOB    Equipment Recommendations  None recommended by PT    Recommendations for Other Services       Precautions / Restrictions Precautions Precautions: Fall Precaution Comments: O2 aerating his trach Restrictions Weight Bearing Restrictions: No      Mobility  Bed Mobility               General bed mobility comments: up on side of bed when PT arrived  Transfers Overall transfer level: Needs assistance Equipment used: 1 person hand held assist Transfers: Sit to/from Stand Sit to Stand: Min guard         General transfer comment: able to stand from lower bed height with no assistance  Ambulation/Gait             General Gait Details: declined to attempt  Stairs            Wheelchair Mobility    Modified Rankin (Stroke Patients Only)       Balance Overall balance assessment: Needs  assistance Sitting-balance support: Feet supported Sitting balance-Leahy Scale: Good     Standing balance support: Single extremity supported Standing balance-Leahy Scale: Fair                               Pertinent Vitals/Pain Pain Assessment: No/denies pain    Home Living Family/patient expects to be discharged to:: Other (Comment)                 Additional Comments: living in a motel situation    Prior Function Level of Independence: Independent with assistive device(s)         Comments: able to maneuver to walk in BR with HHA on furniture     Hand Dominance   Dominant Hand: Right    Extremity/Trunk Assessment   Upper Extremity Assessment Upper Extremity Assessment: Overall WFL for tasks assessed    Lower Extremity Assessment Lower Extremity Assessment: Overall WFL for tasks assessed    Cervical / Trunk Assessment Cervical / Trunk Assessment: Normal  Communication   Communication: No difficulties(using hand to cover trach to speak)  Cognition Arousal/Alertness: Awake/alert Behavior During Therapy: Flat affect Overall Cognitive Status: Within Functional Limits for tasks assessed  General Comments General comments (skin integrity, edema, etc.): has skin protective bandages on sacrum, no obvious skin breakdown    Exercises     Assessment/Plan    PT Assessment Patient needs continued PT services  PT Problem List Decreased range of motion;Decreased activity tolerance;Decreased balance;Decreased mobility;Decreased safety awareness;Cardiopulmonary status limiting activity;Obesity       PT Treatment Interventions DME instruction;Gait training;Functional mobility training;Therapeutic activities;Therapeutic exercise;Neuromuscular re-education;Balance training;Patient/family education    PT Goals (Current goals can be found in the Care Plan section)  Acute Rehab PT Goals Patient  Stated Goal: to feel better and get to Assisted living sometime soon PT Goal Formulation: With patient Time For Goal Achievement: 10/28/17 Potential to Achieve Goals: Good    Frequency Min 3X/week   Barriers to discharge Decreased caregiver support level home with no physical assistance    Co-evaluation               AM-PAC PT "6 Clicks" Daily Activity  Outcome Measure Difficulty turning over in bed (including adjusting bedclothes, sheets and blankets)?: A Little Difficulty moving from lying on back to sitting on the side of the bed? : A Lot Difficulty sitting down on and standing up from a chair with arms (e.g., wheelchair, bedside commode, etc,.)?: Unable Help needed moving to and from a bed to chair (including a wheelchair)?: A Little Help needed walking in hospital room?: A Lot Help needed climbing 3-5 steps with a railing? : A Lot 6 Click Score: 13    End of Session Equipment Utilized During Treatment: Oxygen([name]) Activity Tolerance: Patient limited by fatigue;Treatment limited secondary to medical complications (Comment)(O2 attached to wall, will need conversion hardware to walk) Patient left: in bed;with call bell/phone within reach Nurse Communication: Mobility status PT Visit Diagnosis: Unsteadiness on feet (R26.81);Other abnormalities of gait and mobility (R26.89);Apraxia (R48.2);Other (comment)(hypoxia)    Time: [id number] PT Time Calculation (min) (ACUTE ONLY): 23 min   Charges:   PT Evaluation $PT Eval Moderate Complexity: 1 Mod PT Treatments $Therapeutic Activity: 8-22 mins       Ivar DrapeRuth E Yaniel Limbaugh 10/14/2017, 3:50 PM  Samul Dadauth Leondra Cullin, PT MS Acute Rehab Dept. Number: [facility] [phone] and [facility] [phone]

## 2017-10-14 NOTE — Progress Notes (Signed)
Asked by RN to checked on patient who stated "he didn't feel right, felt anxious". Vitals were stable HR 79 and SPO2 100% on 40% ATC. Patient had a non-productive cough. Was suctioned and received very little Jaydenn Boccio sputum. Patient still is not wearing inner cannula. BBS were clear and diminished in the bases. Made RN aware of findings. RT will continue to monitor.

## 2017-10-14 NOTE — Progress Notes (Signed)
PROGRESS NOTE [facility]   Bryton Romagnoli   ZOX:[id number] DOB: 28-Mar-1955  DOA: 10/09/2017 PCP: Morrell Riddle, PA-C   Brief Narrative:  Tyrone Cunningham 62 year old male with history of tracheostomy, hypertension, diabetes type 2, COPD, OSA, chronic diastolic CHF and neuropathy resented to the emergency department complaining of dyspnea, cough and subjective fever.  Upon ED evaluation was found to be hypoxic.  Chest x-ray showed right lower lobe infiltrate with elevated white count and patient was admitted with working diagnosis of pneumonia.  Subjective: Patient seen and examined, he is feeling ok today, have no complaints, feels that is progressing. No acute events overnight. Remains afebrile.   Assessment & Plan: Acute on chronic respiratory failure with hypoxia/trach dependent Due to pneumonia, wean O2 as able.  Treat underlying cause  Sepsis secondary to HCAP  Was initially on vancomycin and cefepime, subsequently transitioned to Levaquin.  Strep pneumonia antigen negative.  Blood culture positive for coag negative staph in 1 bottle, feel to be contaminant. Clinically improving.  Will change IV Levaquin to p.o. Form.  If able to wean oxygen back to baseline possible DC in a.m.  [name] with frequent exacerbations/Obstructive sleep apnea No wheezing, continue nebulizer treatments and oxygen supplementation as needed. Check pulse ox with ambulation.  PT.  Out of bed as tolerated  DM (diabetes mellitus), type 2 with neurological complications  CBGs stable, continue current regimen  Hypokalemia K-Dur, check BMP and mag in a.m.  HTN Metoprolol  Severe protein-calorie malnutrition  Hypoalbuminemia  Supplement  Chronic diastolic CHF On Lasix, he seems to be euvolemic.  Morbid obesity Body mass index is 40.4 kg/m.  DVT prophylaxis: Lovenox Code Status: Full Code Family Communication: None  Disposition Plan: If remains stable d/c home in AM    Consultants:    None   Procedures:   None   Antimicrobials: Anti-infectives (From admission, onward)   Start     Dose/Rate Route Frequency Ordered Stop   10/14/17 1000  levofloxacin (LEVAQUIN) tablet 750 mg     750 mg Oral Daily 10/14/17 0955     10/12/17 0800  levofloxacin (LEVAQUIN) IVPB 750 mg  Status:  Discontinued     750 mg 100 mL/hr over 90 Minutes Intravenous Every 24 hours 10/12/17 0742 10/14/17 0955   10/10/17 0600  vancomycin (VANCOCIN) IVPB 1000 mg/200 mL premix  Status:  Discontinued     1,000 mg 200 mL/hr over 60 Minutes Intravenous Every 12 hours 10/09/17 1849 10/11/17 1426   10/10/17 0130  ceFEPIme (MAXIPIME) 1 g in sodium chloride 0.9 % 100 mL IVPB  Status:  Discontinued     1 g 200 mL/hr over 30 Minutes Intravenous Every 8 hours 10/09/17 1849 10/12/17 0952   10/09/17 1800  vancomycin (VANCOCIN) 2,500 mg in sodium chloride 0.9 % 500 mL IVPB     2,500 mg 250 mL/hr over 120 Minutes Intravenous  Once 10/09/17 1718 10/09/17 1940   10/09/17 1700  ceFEPIme (MAXIPIME) 2 g in sodium chloride 0.9 % 100 mL IVPB     2 g 200 mL/hr over 30 Minutes Intravenous  Once 10/09/17 1657 10/09/17 1838   10/09/17 1700  vancomycin (VANCOCIN) IVPB 1000 mg/200 mL premix  Status:  Discontinued     1,000 mg 200 mL/hr over 60 Minutes Intravenous  Once 10/09/17 1657 10/09/17 1718           Objective: Vitals:   10/14/17 0420 10/14/17 0737 10/14/17 0739 10/14/17 1128  BP:  (!) 144/77    Pulse:  79  69  Resp:  19  18  Temp:  97.8 F (36.6 C)    TempSrc:  Oral    SpO2: 97% 99% 99% 100%  Weight:      Height:        Intake/Output Summary (Last 24 hours) at 10/14/2017 1522 Last data filed at 10/14/2017 1327 Gross per 24 hour  Intake 769.97 ml  Output 1375 ml  Net -605.03 ml   Filed Weights   10/09/17 2027  Weight: 117 kg    Examination:  General exam: Appears calm and comfortable  HEENT: Trach in place  Respiratory system: Decrease BS B/L, mild rhonchi. No wheezing  Cardiovascular  system: S1 & S2 heard, RRR. No JVD, murmurs, rubs or gallops Gastrointestinal system: Abdomen is nondistended, soft and nontender.  Central nervous system: Alert and oriented.  Extremities: Trace LE bilaterally  Skin: No rashes Psychiatry:  Mood & affect appropriate.    Data Reviewed: I have personally reviewed following labs and imaging studies  CBC: Recent Labs  Lab 10/09/17 1734 10/10/17 0311 10/11/17 0219 10/12/17 0607 10/13/17 0210  WBC 22.6* 22.0* 18.7* 17.7* 14.6*  NEUTROABS 18.2*  --   --   --   --   HGB 11.9* 11.5* 10.4* 10.8* 10.4*  HCT 37.7* 35.9* 32.6* 33.4* 32.4*  MCV 89.3 89.1 89.8 88.1 89.3  PLT 316 278 247 303 313   Basic Metabolic Panel: Recent Labs  Lab 10/09/17 1734 10/10/17 0311 10/10/17 0836 10/11/17 0219 10/13/17 0210  NA 138 139  --  136 139  K 3.2* 3.3*  --  3.5 3.0*  CL 99 100  --  97* 96*  CO2 27 27  --  29 33*  GLUCOSE 124* 88  --  208* 205*  BUN 14 15  --  14 14  CREATININE 1.11 1.01  --  1.14 1.10  CALCIUM 8.8* 8.5*  --  8.5* 8.7*  MG  --   --  1.9  --  2.0   GFR: Estimated Creatinine Clearance: 83 mL/min (by C-G formula based on SCr of 1.1 mg/dL). Liver Function Tests: Recent Labs  Lab 10/09/17 1734 10/10/17 0311  AST 23 28  ALT 28 29  ALKPHOS 132* 119  BILITOT 0.6 0.6  PROT 7.1 6.5  ALBUMIN 2.3* 1.9*   No results for input(s): LIPASE, AMYLASE in the last 168 hours. No results for input(s): AMMONIA in the last 168 hours. Coagulation Profile: No results for input(s): INR, PROTIME in the last 168 hours. Cardiac Enzymes: No results for input(s): CKTOTAL, CKMB, CKMBINDEX, TROPONINI in the last 168 hours. BNP (last 3 results) No results for input(s): PROBNP in the last 8760 hours. HbA1C: No results for input(s): HGBA1C in the last 72 hours. CBG: Recent Labs  Lab 10/13/17 1156 10/13/17 1635 10/13/17 2056 10/14/17 0735 10/14/17 1208  GLUCAP 176* 207* 174* 124* 155*   Lipid Profile: No results for input(s): CHOL, HDL,  LDLCALC, TRIG, CHOLHDL, LDLDIRECT in the last 72 hours. Thyroid Function Tests: No results for input(s): TSH, T4TOTAL, FREET4, T3FREE, THYROIDAB in the last 72 hours. Anemia Panel: No results for input(s): VITAMINB12, FOLATE, FERRITIN, TIBC, IRON, RETICCTPCT in the last 72 hours. Sepsis Labs: Recent Labs  Lab 10/09/17 1719 10/09/17 2117 10/10/17 0005  LATICACIDVEN 2.13* 0.9 1.2    Recent Results (from the past 240 hour(s))  Blood Culture (routine x 2)     Status: None   Collection Time: 10/09/17  4:54 PM  Result Value Ref Range Status  Specimen Description BLOOD BLOOD LEFT HAND  Final   Special Requests   Final    BOTTLES DRAWN AEROBIC AND ANAEROBIC Blood Culture adequate volume   Culture   Final    NO GROWTH 5 DAYS Performed at [facility], [address] [place], [place] [address]    Report Status 10/14/2017 FINAL  Final  Blood Culture (routine x 2)     Status: Abnormal   Collection Time: 10/09/17  4:59 PM  Result Value Ref Range Status   Specimen Description BLOOD LEFT ANTECUBITAL  Final   Special Requests   Final    BOTTLES DRAWN AEROBIC AND ANAEROBIC Blood Culture adequate volume   Culture  Setup Time   Final    GRAM POSITIVE COCCI IN CLUSTERS IN BOTH AEROBIC AND ANAEROBIC BOTTLES CRITICAL RESULT CALLED TO, READ BACK BY AND VERIFIED WITH: R. RUMBARGER PHARMD, AT 1530 10/10/17 BY D. VANHOOK    Culture (A)  Final    STAPHYLOCOCCUS SPECIES (COAGULASE NEGATIVE) THE SIGNIFICANCE OF ISOLATING THIS ORGANISM FROM A SINGLE SET OF BLOOD CULTURES WHEN MULTIPLE SETS ARE DRAWN IS UNCERTAIN. PLEASE NOTIFY THE MICROBIOLOGY DEPARTMENT WITHIN ONE WEEK IF SPECIATION AND SENSITIVITIES ARE REQUIRED. Performed at [facility], [address] [place], [place] [address]    Report Status 10/12/2017 FINAL  Final  Blood Culture ID Panel (Reflexed)     Status: Abnormal   Collection Time: 10/09/17  4:59 PM  Result Value Ref Range Status   Enterococcus species NOT DETECTED NOT  DETECTED Final   Listeria monocytogenes NOT DETECTED NOT DETECTED Final   Staphylococcus species DETECTED (A) NOT DETECTED Final    Comment: Methicillin (oxacillin) resistant coagulase negative staphylococcus. Possible blood culture contaminant (unless isolated from more than one blood culture draw or clinical case suggests pathogenicity). No antibiotic treatment is indicated for blood  culture contaminants. CRITICAL RESULT CALLED TO, READ BACK BY AND VERIFIED WITH: R. RUMBARGER PHARMD, AT 1530 10/10/17 BY D. VANHOOK    Staphylococcus aureus NOT DETECTED NOT DETECTED Final   Methicillin resistance DETECTED (A) NOT DETECTED Final    Comment: CRITICAL RESULT CALLED TO, READ BACK BY AND VERIFIED WITH: R. RUMBARGER PHARMD, AT 1530 10/10/17 BY D. VANHOOK    Streptococcus species NOT DETECTED NOT DETECTED Final   Streptococcus agalactiae NOT DETECTED NOT DETECTED Final   Streptococcus pneumoniae NOT DETECTED NOT DETECTED Final   Streptococcus pyogenes NOT DETECTED NOT DETECTED Final   Acinetobacter baumannii NOT DETECTED NOT DETECTED Final   Enterobacteriaceae species NOT DETECTED NOT DETECTED Final   Enterobacter cloacae complex NOT DETECTED NOT DETECTED Final   Escherichia coli NOT DETECTED NOT DETECTED Final   Klebsiella oxytoca NOT DETECTED NOT DETECTED Final   Klebsiella pneumoniae NOT DETECTED NOT DETECTED Final   Proteus species NOT DETECTED NOT DETECTED Final   Serratia marcescens NOT DETECTED NOT DETECTED Final   Haemophilus influenzae NOT DETECTED NOT DETECTED Final   Neisseria meningitidis NOT DETECTED NOT DETECTED Final   Pseudomonas aeruginosa NOT DETECTED NOT DETECTED Final   Candida albicans NOT DETECTED NOT DETECTED Final   Candida glabrata NOT DETECTED NOT DETECTED Final   Candida krusei NOT DETECTED NOT DETECTED Final   Candida parapsilosis NOT DETECTED NOT DETECTED Final   Candida tropicalis NOT DETECTED NOT DETECTED Final    Comment: Performed at [facility],  [address] [place], [place] [address]  MRSA PCR Screening     Status: None   Collection Time: 10/10/17  6:15 PM  Result Value Ref Range Status   MRSA by PCR NEGATIVE NEGATIVE Final    Comment:        The GeneXpert MRSA Assay (FDA approved for NASAL specimens only), is one component of a comprehensive MRSA colonization surveillance program. It is not intended to diagnose MRSA infection nor to guide or monitor treatment for MRSA infections. Performed at [facility], [address] [place], [place] [address]   Culture, respiratory (non-expectorated)     Status: None (Preliminary result)   Collection Time: 10/12/17  4:37 PM  Result Value Ref Range Status   Specimen Description TRACHEAL ASPIRATE  Final   Special Requests NONE  Final   Gram Stain   Final    ABUNDANT WBC PRESENT, PREDOMINANTLY PMN NO ORGANISMS SEEN    Culture   Final    FEW GRAM NEGATIVE RODS IDENTIFICATION AND SUSCEPTIBILITIES TO FOLLOW Performed at [facility], [address] [place], [place] [address]    Report Status PENDING  Incomplete      Radiology Studies: No results found.    Scheduled Meds: . atorvastatin  20 mg Oral Daily  . enoxaparin (LOVENOX) injection  40 mg Subcutaneous Q24H  . furosemide  20 mg Oral Daily  . glipiZIDE  5 mg Oral BID AC  . insulin aspart  0-5 Units Subcutaneous QHS  . insulin aspart  0-9 Units Subcutaneous TID WC  . levofloxacin  750 mg Oral Daily  . metoprolol tartrate  75 mg Oral BID  . polyethylene glycol  17 g Oral Daily  . potassium chloride  40 mEq Oral Daily  . sodium chloride flush  3 mL Intravenous Q12H  . umeclidinium bromide  1 puff Inhalation Daily   Continuous Infusions: . sodium chloride Stopped (10/11/17 1000)     LOS: 5 days    Time spent: Total of 25 minutes spent with pt, greater than 50% of which was spent in discussion of  treatment, counseling and coordination of care  Latrelle Dodrill, MD Pager: Text Page via [email or web address]    If 7PM-7AM, please contact night-coverage [email or web address] 10/14/2017, 3:22 PM   Note - This record has been created using [employer]. Chart creation errors have been sought, but may not always have been located. Such creation errors do not reflect on the standard of medical care.

## 2017-10-14 NOTE — Care Management Note (Addendum)
Case Management Note  Patient Details  Name: Tyrone BaileyMark Hook MRN: [id number] Date of Birth: 05/23/1955  Subjective/Objective:    From a [address]- your choice extended stay [address] [place] [phone], [facility] ,uses it at night 10 liters, he also has a neb machine, presents with HCAP, has chronic tracheostomy, he does his own trach care. He states his brother [name] will transport him home at discharge [phone]. NCM contacted Jermaine with [facility] and informed him that patient will need oxygen tank at discharge to be brought up to room.  Vaughan BastaJermaine states he will take care of this.                  Action/Plan: DC home when ready.  Expected Discharge Date:  10/12/17               Expected Discharge Plan:  Home/Self Care  In-House Referral:     Discharge planning Services  CM Consult  Post Acute Care Choice:    Choice offered to:     DME Arranged:    DME Agency:     HH Arranged:    HH Agency:     Status of Service:  In process, will continue to follow  If discussed at Long Length of Stay Meetings, dates discussed:    Additional Comments:  [name], [name], RN 10/14/2017, 3:49 PM

## 2017-10-15 LAB — CULTURE, RESPIRATORY W GRAM STAIN

## 2017-10-15 LAB — BASIC METABOLIC PANEL
ANION GAP: 5 (ref 5–15)
BUN: 12 mg/dL (ref 8–23)
CALCIUM: 9 mg/dL (ref 8.9–10.3)
CO2: 36 mmol/L — ABNORMAL HIGH (ref 22–32)
Chloride: 96 mmol/L — ABNORMAL LOW (ref 98–111)
Creatinine, Ser: 1.05 mg/dL (ref 0.61–1.24)
Glucose, Bld: 140 mg/dL — ABNORMAL HIGH (ref 70–99)
Potassium: 4.9 mmol/L (ref 3.5–5.1)
Sodium: 137 mmol/L (ref 135–145)

## 2017-10-15 LAB — GLUCOSE, CAPILLARY
GLUCOSE-CAPILLARY: 138 mg/dL — AB (ref 70–99)
Glucose-Capillary: 187 mg/dL — ABNORMAL HIGH (ref 70–99)
Glucose-Capillary: 117 mg/dL — ABNORMAL HIGH (ref 70–99)

## 2017-10-15 LAB — CULTURE, RESPIRATORY

## 2017-10-15 LAB — MAGNESIUM: MAGNESIUM: 2 mg/dL (ref 1.7–2.4)

## 2017-10-15 MED ORDER — SULFAMETHOXAZOLE-TRIMETHOPRIM 800-160 MG PO TABS: 1.0000 | ORAL_TABLET | Freq: Two times a day (BID) | ORAL | 0 refills | Status: AC

## 2017-10-15 MED ORDER — SULFAMETHOXAZOLE-TRIMETHOPRIM 800-160 MG PO TABS
1.0000 | ORAL_TABLET | Freq: Two times a day (BID) | ORAL | Status: DC
  Administered 2017-10-15: 1 via ORAL
  Filled 2017-10-15: qty 1

## 2017-10-15 MED ORDER — HYDROXYZINE HCL 25 MG PO TABS: 25.0000 mg | ORAL_TABLET | Freq: Four times a day (QID) | ORAL | 0 refills | Status: DC | PRN

## 2017-10-15 MED ORDER — HYDROXYZINE HCL 25 MG PO TABS
25.0000 mg | ORAL_TABLET | Freq: Four times a day (QID) | ORAL | Status: DC | PRN
  Administered 2017-10-15: 25 mg via ORAL
  Filled 2017-10-15: qty 1

## 2017-10-15 NOTE — Care Management Note (Signed)
Case Management Note Donn PieriniKristi Havanna Groner RN, BSN Unit 4E- RN Care Coordinator -- Cross Coverage for 2W on 8/15 [phone]  Patient Details  Name: Tyrone BaileyMark Lun MRN: [id number] Date of Birth: 12/11/1955  Previous CM note completed by Letha Capeeborah Taylor RN, CM  Subjective/Objective:  From a hotel- your choice extended stay [address] [place] [phone], [facility] ,uses it at night 10 liters, he also has a neb machine, presents with HCAP, has chronic tracheostomy, he does his own trach care. He states his brother [name] will transport him home at discharge [phone]. NCM contacted Jermaine with [facility] and informed him that patient will need oxygen tank at discharge to be brought up to room.  Vaughan BastaJermaine states he will take care of this                  Action/Plan: [facility].  Expected Discharge Date:  10/15/17               Expected Discharge Plan:  Home w Home Health Services  In-House Referral:     Discharge planning Services  CM Consult  Post Acute Care Choice:  Home Health Choice offered to:  Patient  DME Arranged:  Oxygen DME Agency:  [facility]  HH Arranged:  PT, Respirator Therapy HH Agency:  [facility]  Status of Service:  Completed, signed off  If discussed at Long Length of Stay Meetings, dates discussed:    Discharge Disposition: home/home health  Additional Comments:  10/15/17- 1600- Masiah Woody RN, CM- pt for discharge home to hotel today- spoke with pt at bedside- brother to come transport pt home- HH orders have been placed- CM [facility]- referral placed by previous CM. [facility] also [facility] 02 tank for pt to transport home- tank to be delivered to room prior to discharge.   Darrold SpanWebster, Gradyn Shein Hall, RN 10/15/2017, 4:28 PM

## 2017-10-15 NOTE — Discharge Summary (Signed)
Physician Discharge Summary  Tyrone Cunningham  ZOX:[id number]  DOB: [birth year]  DOA: 10/09/2017 PCP: Morrell Riddle, PA-C  Admit date: 10/09/2017 Discharge date: 10/15/2017  Admitted From: Home  Disposition: Home   Recommendations for Outpatient Follow-up:  1. Follow up with PCP in 1 week  2. Please obtain BMP/CBC in one week to monitor Hgb and renal function  Home Health: PT Equipment/Devices: O2   Discharge Condition: Stable  CODE STATUS: Full Code  Diet recommendation: Heart Healthy   Brief/Interim Summary: For full details see H&P/Progress note, but in brief, Tyrone Cunningham is a 62 year old male with history of tracheostomy, hypertension, diabetes type 2, COPD, OSA, chronic diastolic CHF and neuropathy resented to the emergency department complaining of dyspnea, cough and subjective fever.  Upon ED evaluation was found to be hypoxic.  Chest x-ray showed right lower lobe infiltrate with elevated white count and patient was admitted with working diagnosis of pneumonia.  Subjective: Patient seen and examined, he is somewhat anxious about going home.  O2 has been weaned to baseline.  No acute events overnight.  Denies chest pain, cough and palpitation.  Discharge Diagnoses/Hospital Course:  Principal Problem:   HCAP (healthcare-associated pneumonia) Active Problems:   [name] with frequent exacerbations   Diabetic neuropathy (HCC)   Tracheostomy dependent (HCC)   DM (diabetes mellitus), type 2 with neurological complications (HCC)   Obstructive sleep apnea   Hypokalemia   Abnormal liver function   Severe protein-calorie malnutrition (HCC)  Acute on chronic respiratory failure with hypoxia/trach dependent Treating underlying causes, oxygen requirement back to baseline.  Sepsis secondary to HCAP  Was initially on vancomycin and cefepime, subsequently transitioned to Levaquin.  Strep pneumonia antigen negative.  Blood culture positive for coag negative staph in 1 bottle, feel to be  contaminant. Clinically improving.  Trach culture positive for Achromobacter resistant to cipro, will switch to bactrim to complete 5 days. Continue O2 supplementation as needed. Home health has been arranged.   [name] with frequent exacerbations/Obstructive sleep apnea No wheezing, continue nebulizer treatments and oxygen supplementation as needed. Continue home inhalers.   DM (diabetes mellitus), type 2 with neurological complications  CBGs stable, continue current regimen.   Hypokalemia Resolved   HTN BP stable continue Metoprolol  Severe protein-calorie malnutrition  Hypoalbuminemia  Continue Supplements  Chronic diastolic CHF On Lasix,euvolemic   Morbid obesity  All other chronic medical condition were stable during the hospitalization.  Patient was seen by physical therapy, recommending HHPT  On the day of the discharge the patient's vitals were stable, and no other acute medical condition were reported by patient. the patient was felt safe to be discharge to home   Discharge Instructions  You were cared for by a hospitalist during your hospital stay. If you have any questions about your discharge medications or the care you received while you were in the hospital after you are discharged, you can call the unit and asked to speak with the hospitalist on call if the hospitalist that took care of you is not available. Once you are discharged, your primary care physician will handle any further medical issues. Please note that NO REFILLS for any discharge medications will be authorized once you are discharged, as it is imperative that you return to your primary care physician (or establish a relationship with a primary care physician if you do not have one) for your aftercare needs so that they can reassess your need for medications and monitor your lab values.  Discharge Instructions  Call MD for:  difficulty breathing, headache or visual disturbances   Complete  by:  As directed    Call MD for:  extreme fatigue   Complete by:  As directed    Call MD for:  hives   Complete by:  As directed    Call MD for:  persistant dizziness or light-headedness   Complete by:  As directed    Call MD for:  persistant nausea and vomiting   Complete by:  As directed    Call MD for:  redness, tenderness, or signs of infection (pain, swelling, redness, odor or green/yellow discharge around incision site)   Complete by:  As directed    Call MD for:  severe uncontrolled pain   Complete by:  As directed    Call MD for:  temperature >100.4   Complete by:  As directed    Diet - low sodium heart healthy   Complete by:  As directed    Increase activity slowly   Complete by:  As directed      Allergies as of 10/15/2017   No Known Allergies     Medication List    STOP taking these medications   doxycycline 100 MG tablet Commonly known as:  VIBRA-TABS     TAKE these medications   albuterol (2.5 MG/3ML) 0.083% nebulizer solution Commonly known as:  PROVENTIL Take 3 mLs (2.5 mg total) by nebulization every 6 (six) hours as needed for wheezing or shortness of breath.   albuterol 108 (90 Base) MCG/ACT inhaler Commonly known as:  PROVENTIL HFA;VENTOLIN HFA Inhale 2 puffs into the lungs every 6 (six) hours as needed for wheezing or shortness of breath.   atorvastatin 20 MG tablet Commonly known as:  LIPITOR Take 1 tablet (20 mg total) by mouth daily.   furosemide 20 MG tablet Commonly known as:  LASIX Take 1 tablet (20 mg total) by mouth daily.   glipiZIDE 5 MG tablet Commonly known as:  GLUCOTROL Take 1 tablet (5 mg total) by mouth 2 (two) times daily before a meal.   hydrOXYzine 25 MG tablet Commonly known as:  ATARAX/VISTARIL Take 1 tablet (25 mg total) by mouth every 6 (six) hours as needed for anxiety.   metoprolol tartrate 50 MG tablet Commonly known as:  LOPRESSOR Take 1.5 tablets (75 mg total) by mouth 2 (two) times daily. What changed:   Another medication with the same name was removed. Continue taking this medication, and follow the directions you see here.   oxyCODONE-acetaminophen 10-325 MG tablet Commonly known as:  PERCOCET Take 1 tablet by mouth every 6 (six) hours as needed for pain.   OXYGEN Inhale 10 L into the lungs continuous.   polyethylene glycol powder powder Commonly known as:  GLYCOLAX/MIRALAX Take 17 g by mouth daily.   sulfamethoxazole-trimethoprim 800-160 MG tablet Commonly known as:  BACTRIM DS,SEPTRA DS Take 1 tablet by mouth every 12 (twelve) hours for 5 days.   traMADol 50 MG tablet Commonly known as:  ULTRAM Take 50-100 mg by mouth 3 (three) times daily as needed for moderate pain.   umeclidinium bromide 62.5 MCG/INH Aepb Commonly known as:  INCRUSE ELLIPTA Inhale 1 puff into the lungs daily.      Follow-up Information    Weber, Dema Severin, PA-C. Schedule an appointment as soon as possible for a visit in 1 week(s).   Specialty:  Physician Assistant Why:  Hospital follow up  Contact information: [address] [place] [address] [phone]  No Known Allergies  Consultations:  None  Procedures/Studies: Dg Chest 2 View  Result Date: 10/12/2017 CLINICAL DATA:  Shortness of breath EXAM: CHEST - 2 VIEW COMPARISON:  10/09/2017 and prior chest radiographs FINDINGS: Cardiomediastinal silhouette is unchanged. A tracheostomy tube is again identified. Increased RIGHT basilar opacity noted. No pneumothorax or definite pleural effusion. No acute bony abnormalities are present. IMPRESSION: Increased RIGHT basilar opacity - question atelectasis versus airspace disease/pneumonia. Electronically Signed   By: Harmon PierJeffrey  Hu M.D.   On: 10/12/2017 08:53   Dg Chest Portable 1 View  Result Date: 10/09/2017 CLINICAL DATA:  Shortness of breath.  Low O2 saturation. EXAM: PORTABLE CHEST 1 VIEW COMPARISON:  Chest x-rays dated 08/07/2017 and 04/29/2017 FINDINGS: Tracheostomy tube in place.  Heart size and vascularity are normal. Aortic atherosclerosis. New faint area of infiltrate/atelectasis at the right lung base. Left lung is clear. No acute bone abnormality. IMPRESSION: New focal area of infiltrate/atelectasis at the right lung base. Electronically Signed   By: Francene BoyersJames  Maxwell M.D.   On: 10/09/2017 16:48    Discharge Exam: Vitals:   10/15/17 1120 10/15/17 1348  BP:    Pulse: 74 70  Resp: 18 18  Temp:    SpO2: 97% 99%   Vitals:   10/15/17 0843 10/15/17 0859 10/15/17 1120 10/15/17 1348  BP:      Pulse: 79  74 70  Resp: 18  18 18   Temp:      TempSrc:      SpO2: 94% 94% 97% 99%  Weight:      Height:        General: Mild anxious  Cardiovascular: RRR, S1/S2 +, no rubs, no gallops Respiratory: Air entry improved, mild bibasilar rales. No wheezing  Abdominal: Soft, NT, ND, bowel sounds + Extremities: no edema   The results of significant diagnostics from this hospitalization (including imaging, microbiology, ancillary and laboratory) are listed below for reference.     Microbiology: Recent Results (from the past 240 hour(s))  Blood Culture (routine x 2)     Status: None   Collection Time: 10/09/17  4:54 PM  Result Value Ref Range Status   Specimen Description BLOOD BLOOD LEFT HAND  Final   Special Requests   Final    BOTTLES DRAWN AEROBIC AND ANAEROBIC Blood Culture adequate volume   Culture   Final    NO GROWTH 5 DAYS Performed at [facility], [address] [place], [place] [address]    Report Status 10/14/2017 FINAL  Final  Blood Culture (routine x 2)     Status: Abnormal   Collection Time: 10/09/17  4:59 PM  Result Value Ref Range Status   Specimen Description BLOOD LEFT ANTECUBITAL  Final   Special Requests   Final    BOTTLES DRAWN AEROBIC AND ANAEROBIC Blood Culture adequate volume   Culture  Setup Time   Final    GRAM POSITIVE COCCI IN CLUSTERS IN BOTH AEROBIC AND ANAEROBIC BOTTLES CRITICAL RESULT CALLED TO, READ BACK BY AND VERIFIED  WITH: R. RUMBARGER PHARMD, AT 1530 10/10/17 BY D. VANHOOK    Culture (A)  Final    STAPHYLOCOCCUS SPECIES (COAGULASE NEGATIVE) THE SIGNIFICANCE OF ISOLATING THIS ORGANISM FROM A SINGLE SET OF BLOOD CULTURES WHEN MULTIPLE SETS ARE DRAWN IS UNCERTAIN. PLEASE NOTIFY THE MICROBIOLOGY DEPARTMENT WITHIN ONE WEEK IF SPECIATION AND SENSITIVITIES ARE REQUIRED. Performed at [facility], [address] [place], [place] [address]    Report Status 10/12/2017 FINAL  Final  Blood Culture  ID Panel (Reflexed)     Status: Abnormal   Collection Time: 10/09/17  4:59 PM  Result Value Ref Range Status   Enterococcus species NOT DETECTED NOT DETECTED Final   Listeria monocytogenes NOT DETECTED NOT DETECTED Final   Staphylococcus species DETECTED (A) NOT DETECTED Final    Comment: Methicillin (oxacillin) resistant coagulase negative staphylococcus. Possible blood culture contaminant (unless isolated from more than one blood culture draw or clinical case suggests pathogenicity). No antibiotic treatment is indicated for blood  culture contaminants. CRITICAL RESULT CALLED TO, READ BACK BY AND VERIFIED WITH: R. RUMBARGER PHARMD, AT 1530 10/10/17 BY D. VANHOOK    Staphylococcus aureus NOT DETECTED NOT DETECTED Final   Methicillin resistance DETECTED (A) NOT DETECTED Final    Comment: CRITICAL RESULT CALLED TO, READ BACK BY AND VERIFIED WITH: R. RUMBARGER PHARMD, AT 1530 10/10/17 BY D. VANHOOK    Streptococcus species NOT DETECTED NOT DETECTED Final   Streptococcus agalactiae NOT DETECTED NOT DETECTED Final   Streptococcus pneumoniae NOT DETECTED NOT DETECTED Final   Streptococcus pyogenes NOT DETECTED NOT DETECTED Final   Acinetobacter baumannii NOT DETECTED NOT DETECTED Final   Enterobacteriaceae species NOT DETECTED NOT DETECTED Final   Enterobacter cloacae complex NOT DETECTED NOT DETECTED Final   Escherichia coli NOT DETECTED NOT DETECTED Final   Klebsiella oxytoca NOT DETECTED NOT DETECTED Final    Klebsiella pneumoniae NOT DETECTED NOT DETECTED Final   Proteus species NOT DETECTED NOT DETECTED Final   Serratia marcescens NOT DETECTED NOT DETECTED Final   Haemophilus influenzae NOT DETECTED NOT DETECTED Final   Neisseria meningitidis NOT DETECTED NOT DETECTED Final   Pseudomonas aeruginosa NOT DETECTED NOT DETECTED Final   Candida albicans NOT DETECTED NOT DETECTED Final   Candida glabrata NOT DETECTED NOT DETECTED Final   Candida krusei NOT DETECTED NOT DETECTED Final   Candida parapsilosis NOT DETECTED NOT DETECTED Final   Candida tropicalis NOT DETECTED NOT DETECTED Final    Comment: Performed at [facility], [address] [place], [place] [address]  MRSA PCR Screening     Status: None   Collection Time: 10/10/17  6:15 PM  Result Value Ref Range Status   MRSA by PCR NEGATIVE NEGATIVE Final    Comment:        The GeneXpert MRSA Assay (FDA approved for NASAL specimens only), is one component of a comprehensive MRSA colonization surveillance program. It is not intended to diagnose MRSA infection nor to guide or monitor treatment for MRSA infections. Performed at [facility], [address] [place], [place] [address]   Culture, respiratory (non-expectorated)     Status: None   Collection Time: 10/12/17  4:37 PM  Result Value Ref Range Status   Specimen Description TRACHEAL ASPIRATE  Final   Special Requests NONE  Final   Gram Stain   Final    ABUNDANT WBC PRESENT, PREDOMINANTLY PMN NO ORGANISMS SEEN Performed at [facility], [address] [place], [place] [address]    Culture FEW ACHROMOBACTER XYLOSOXIDANS  Final   Report Status 10/15/2017 FINAL  Final   Organism ID, Bacteria ACHROMOBACTER XYLOSOXIDANS  Final      Susceptibility   Achromobacter xylosoxidans - MIC*    CEFEPIME 16 INTERMEDIATE Intermediate     CEFAZOLIN >=64 RESISTANT Resistant     GENTAMICIN >=16 RESISTANT Resistant     CIPROFLOXACIN >=4 RESISTANT Resistant     IMIPENEM 1  SENSITIVE Sensitive     TRIMETH/SULFA <=20 SENSITIVE Sensitive     *  FEW ACHROMOBACTER XYLOSOXIDANS     Labs: BNP (last 3 results) Recent Labs    04/29/17 0930  BNP 40.4   Basic Metabolic Panel: Recent Labs  Lab 10/09/17 1734 10/10/17 0311 10/10/17 0836 10/11/17 0219 10/13/17 0210 10/15/17 0328  NA 138 139  --  136 139 137  K 3.2* 3.3*  --  3.5 3.0* 4.9  CL 99 100  --  97* 96* 96*  CO2 27 27  --  29 33* 36*  GLUCOSE 124* 88  --  208* 205* 140*  BUN 14 15  --  14 14 12   CREATININE 1.11 1.01  --  1.14 1.10 1.05  CALCIUM 8.8* 8.5*  --  8.5* 8.7* 9.0  MG  --   --  1.9  --  2.0 2.0   Liver Function Tests: Recent Labs  Lab 10/09/17 1734 10/10/17 0311  AST 23 28  ALT 28 29  ALKPHOS 132* 119  BILITOT 0.6 0.6  PROT 7.1 6.5  ALBUMIN 2.3* 1.9*   No results for input(s): LIPASE, AMYLASE in the last 168 hours. No results for input(s): AMMONIA in the last 168 hours. CBC: Recent Labs  Lab 10/09/17 1734 10/10/17 0311 10/11/17 0219 10/12/17 0607 10/13/17 0210  WBC 22.6* 22.0* 18.7* 17.7* 14.6*  NEUTROABS 18.2*  --   --   --   --   HGB 11.9* 11.5* 10.4* 10.8* 10.4*  HCT 37.7* 35.9* 32.6* 33.4* 32.4*  MCV 89.3 89.1 89.8 88.1 89.3  PLT 316 278 247 303 313   Cardiac Enzymes: No results for input(s): CKTOTAL, CKMB, CKMBINDEX, TROPONINI in the last 168 hours. BNP: Invalid input(s): POCBNP CBG: Recent Labs  Lab 10/14/17 1208 10/14/17 1658 10/14/17 2129 10/15/17 0730 10/15/17 1202  GLUCAP 155* 151* 171* 138* 187*   D-Dimer No results for input(s): DDIMER in the last 72 hours. Hgb A1c No results for input(s): HGBA1C in the last 72 hours. Lipid Profile No results for input(s): CHOL, HDL, LDLCALC, TRIG, CHOLHDL, LDLDIRECT in the last 72 hours. Thyroid function studies No results for input(s): TSH, T4TOTAL, T3FREE, THYROIDAB in the last 72 hours.  Invalid input(s): FREET3 Anemia work up No results for input(s): VITAMINB12, FOLATE, FERRITIN, TIBC, IRON,  RETICCTPCT in the last 72 hours. Urinalysis    Component Value Date/Time   COLORURINE YELLOW 10/10/2017 2238   APPEARANCEUR CLOUDY (A) 10/10/2017 2238   LABSPEC 1.019 10/10/2017 2238   PHURINE 6.0 10/10/2017 2238   GLUCOSEU 150 (A) 10/10/2017 2238   HGBUR SMALL (A) 10/10/2017 2238   BILIRUBINUR NEGATIVE 10/10/2017 2238   BILIRUBINUR neg 01/03/2014 1527   KETONESUR NEGATIVE 10/10/2017 2238   PROTEINUR >=300 (A) 10/10/2017 2238   UROBILINOGEN 1.0 01/03/2014 1814   NITRITE NEGATIVE 10/10/2017 2238   LEUKOCYTESUR NEGATIVE 10/10/2017 2238   Sepsis Labs Invalid input(s): PROCALCITONIN,  WBC,  LACTICIDVEN Microbiology Recent Results (from the past 240 hour(s))  Blood Culture (routine x 2)     Status: None   Collection Time: 10/09/17  4:54 PM  Result Value Ref Range Status   Specimen Description BLOOD BLOOD LEFT HAND  Final   Special Requests   Final    BOTTLES DRAWN AEROBIC AND ANAEROBIC Blood Culture adequate volume   Culture   Final    NO GROWTH 5 DAYS Performed at [facility], [address] [place], [place] [address]    Report Status 10/14/2017 FINAL  Final  Blood Culture (routine x 2)     Status: Abnormal   Collection Time:  10/09/17  4:59 PM  Result Value Ref Range Status   Specimen Description BLOOD LEFT ANTECUBITAL  Final   Special Requests   Final    BOTTLES DRAWN AEROBIC AND ANAEROBIC Blood Culture adequate volume   Culture  Setup Time   Final    GRAM POSITIVE COCCI IN CLUSTERS IN BOTH AEROBIC AND ANAEROBIC BOTTLES CRITICAL RESULT CALLED TO, READ BACK BY AND VERIFIED WITH: R. RUMBARGER PHARMD, AT 1530 10/10/17 BY D. VANHOOK    Culture (A)  Final    STAPHYLOCOCCUS SPECIES (COAGULASE NEGATIVE) THE SIGNIFICANCE OF ISOLATING THIS ORGANISM FROM A SINGLE SET OF BLOOD CULTURES WHEN MULTIPLE SETS ARE DRAWN IS UNCERTAIN. PLEASE NOTIFY THE MICROBIOLOGY DEPARTMENT WITHIN ONE WEEK IF SPECIATION AND SENSITIVITIES ARE REQUIRED. Performed at [facility], [address] [place], [place] [address]    Report Status 10/12/2017 FINAL  Final  Blood Culture ID Panel (Reflexed)     Status: Abnormal   Collection Time: 10/09/17  4:59 PM  Result Value Ref Range Status   Enterococcus species NOT DETECTED NOT DETECTED Final   Listeria monocytogenes NOT DETECTED NOT DETECTED Final   Staphylococcus species DETECTED (A) NOT DETECTED Final    Comment: Methicillin (oxacillin) resistant coagulase negative staphylococcus. Possible blood culture contaminant (unless isolated from more than one blood culture draw or clinical case suggests pathogenicity). No antibiotic treatment is indicated for blood  culture contaminants. CRITICAL RESULT CALLED TO, READ BACK BY AND VERIFIED WITH: R. RUMBARGER PHARMD, AT 1530 10/10/17 BY D. VANHOOK    Staphylococcus aureus NOT DETECTED NOT DETECTED Final   Methicillin resistance DETECTED (A) NOT DETECTED Final    Comment: CRITICAL RESULT CALLED TO, READ BACK BY AND VERIFIED WITH: R. RUMBARGER PHARMD, AT 1530 10/10/17 BY D. VANHOOK    Streptococcus species NOT DETECTED NOT DETECTED Final   Streptococcus agalactiae NOT DETECTED NOT DETECTED Final   Streptococcus pneumoniae NOT DETECTED NOT DETECTED Final   Streptococcus pyogenes NOT DETECTED NOT DETECTED Final   Acinetobacter baumannii NOT DETECTED NOT DETECTED Final   Enterobacteriaceae species NOT DETECTED NOT DETECTED Final   Enterobacter cloacae complex NOT DETECTED NOT DETECTED Final   Escherichia coli NOT DETECTED NOT DETECTED Final   Klebsiella oxytoca NOT DETECTED NOT DETECTED Final   Klebsiella pneumoniae NOT DETECTED NOT DETECTED Final   Proteus species NOT DETECTED NOT DETECTED Final   Serratia marcescens NOT DETECTED NOT DETECTED Final   Haemophilus influenzae NOT DETECTED NOT DETECTED Final   Neisseria meningitidis NOT DETECTED NOT DETECTED Final   Pseudomonas aeruginosa NOT DETECTED NOT DETECTED Final   Candida albicans NOT DETECTED NOT DETECTED Final   Candida glabrata  NOT DETECTED NOT DETECTED Final   Candida krusei NOT DETECTED NOT DETECTED Final   Candida parapsilosis NOT DETECTED NOT DETECTED Final   Candida tropicalis NOT DETECTED NOT DETECTED Final    Comment: Performed at [facility], [address] [place], [place] [address]  MRSA PCR Screening     Status: None   Collection Time: 10/10/17  6:15 PM  Result Value Ref Range Status   MRSA by PCR NEGATIVE NEGATIVE Final    Comment:        The GeneXpert MRSA Assay (FDA approved for NASAL specimens only), is one component of a comprehensive MRSA colonization surveillance program. It is not intended to diagnose MRSA infection nor to guide or monitor treatment for MRSA infections. Performed at [facility], [address], [place], [place] [address]   Culture, respiratory (  non-expectorated)     Status: None   Collection Time: 10/12/17  4:37 PM  Result Value Ref Range Status   Specimen Description TRACHEAL ASPIRATE  Final   Special Requests NONE  Final   Gram Stain   Final    ABUNDANT WBC PRESENT, PREDOMINANTLY PMN NO ORGANISMS SEEN Performed at [facility], [address], [place], [place] [address]    Culture FEW ACHROMOBACTER XYLOSOXIDANS  Final   Report Status 10/15/2017 FINAL  Final   Organism ID, Bacteria ACHROMOBACTER XYLOSOXIDANS  Final      Susceptibility   Achromobacter xylosoxidans - MIC*    CEFEPIME 16 INTERMEDIATE Intermediate     CEFAZOLIN >=64 RESISTANT Resistant     GENTAMICIN >=16 RESISTANT Resistant     CIPROFLOXACIN >=4 RESISTANT Resistant     IMIPENEM 1 SENSITIVE Sensitive     TRIMETH/SULFA <=20 SENSITIVE Sensitive     * FEW ACHROMOBACTER XYLOSOXIDANS    Time coordinating discharge: 35 minutes  SIGNED:  Latrelle Dodrill, MD  [facility] 10/15/2017, 3:14 PM  Pager please text page via  [email or web address]  Note - This record has been created using [employer]. Chart creation errors have been sought, but may not always have been located.  Such creation errors do not reflect on the standard of medical care.

## 2017-10-15 NOTE — Progress Notes (Signed)
Pt seen by MD, orders written for d/c.  Went over discharge instructions with pt and answered all questions.  Removed IV's, no complications.  Escorted for discharge via wheelchair with all belongings.  Brother picked up.  [facility] brought [facility] for pt to transport home.  Pt is on 5L and 28%.  Will follow up outpatient with MD.

## 2017-10-19 ENCOUNTER — Emergency Department (HOSPITAL_COMMUNITY): Payer: Medicare Other

## 2017-10-19 ENCOUNTER — Emergency Department (HOSPITAL_COMMUNITY)
Admission: EM | Admit: 2017-10-19 | Discharge: 2017-10-22 | Disposition: A | Discharge status: Skilled Nursing Facility | Payer: Medicare Other | Attending: Emergency Medicine | Admitting: Emergency Medicine

## 2017-10-19 ENCOUNTER — Other Ambulatory Visit: Payer: Self-pay

## 2017-10-19 ENCOUNTER — Encounter (HOSPITAL_COMMUNITY): Payer: Self-pay | Admitting: Emergency Medicine

## 2017-10-19 DIAGNOSIS — I509 Heart failure, unspecified: Secondary | ICD-10-CM | POA: Diagnosis not present

## 2017-10-19 DIAGNOSIS — J449 Chronic obstructive pulmonary disease, unspecified: Secondary | ICD-10-CM | POA: Insufficient documentation

## 2017-10-19 DIAGNOSIS — E119 Type 2 diabetes mellitus without complications: Secondary | ICD-10-CM | POA: Diagnosis not present

## 2017-10-19 DIAGNOSIS — W19XXXA Unspecified fall, initial encounter: Secondary | ICD-10-CM | POA: Insufficient documentation

## 2017-10-19 DIAGNOSIS — Y929 Unspecified place or not applicable: Secondary | ICD-10-CM | POA: Insufficient documentation

## 2017-10-19 DIAGNOSIS — I110 Hypertensive heart disease with heart failure: Secondary | ICD-10-CM | POA: Insufficient documentation

## 2017-10-19 DIAGNOSIS — Z79899 Other long term (current) drug therapy: Secondary | ICD-10-CM | POA: Insufficient documentation

## 2017-10-19 DIAGNOSIS — S0083XA Contusion of other part of head, initial encounter: Secondary | ICD-10-CM | POA: Insufficient documentation

## 2017-10-19 DIAGNOSIS — Z930 Tracheostomy status: Secondary | ICD-10-CM | POA: Insufficient documentation

## 2017-10-19 DIAGNOSIS — Y939 Activity, unspecified: Secondary | ICD-10-CM | POA: Insufficient documentation

## 2017-10-19 DIAGNOSIS — W06XXXA Fall from bed, initial encounter: Secondary | ICD-10-CM

## 2017-10-19 DIAGNOSIS — S0993XA Unspecified injury of face, initial encounter: Secondary | ICD-10-CM | POA: Diagnosis present

## 2017-10-19 DIAGNOSIS — Z87891 Personal history of nicotine dependence: Secondary | ICD-10-CM | POA: Diagnosis not present

## 2017-10-19 DIAGNOSIS — Y999 Unspecified external cause status: Secondary | ICD-10-CM | POA: Insufficient documentation

## 2017-10-19 DIAGNOSIS — S022XXA Fracture of nasal bones, initial encounter for closed fracture: Secondary | ICD-10-CM

## 2017-10-19 LAB — URINALYSIS, ROUTINE W REFLEX MICROSCOPIC
BACTERIA UA: NONE SEEN
BILIRUBIN URINE: NEGATIVE
Glucose, UA: >=500 mg/dL — AB
Hgb urine dipstick: NEGATIVE
Ketones, ur: NEGATIVE mg/dL
Leukocytes, UA: NEGATIVE
NITRITE: NEGATIVE
Protein, ur: >=300 mg/dL — AB
SPECIFIC GRAVITY, URINE: 1.016 (ref 1.005–1.030)
pH: 7 (ref 5.0–8.0)

## 2017-10-19 LAB — CBC WITH DIFFERENTIAL/PLATELET
BASOS ABS: 0 10*3/uL (ref 0.0–0.1)
BASOS PCT: 0 %
Eosinophils Absolute: 0 10*3/uL (ref 0.0–0.7)
Eosinophils Relative: 0 %
HEMATOCRIT: 35.3 % — AB (ref 39.0–52.0)
HEMOGLOBIN: 11.4 g/dL — AB (ref 13.0–17.0)
LYMPHS PCT: 12 %
Lymphs Abs: 1.4 10*3/uL (ref 0.7–4.0)
MCH: 28.4 pg (ref 26.0–34.0)
MCHC: 32.3 g/dL (ref 30.0–36.0)
MCV: 88 fL (ref 78.0–100.0)
Monocytes Absolute: 0.4 10*3/uL (ref 0.1–1.0)
Monocytes Relative: 4 %
NEUTROS ABS: 9.9 10*3/uL — AB (ref 1.7–7.7)
Neutrophils Relative %: 84 %
Platelets: 338 10*3/uL (ref 150–400)
RBC: 4.01 MIL/uL — ABNORMAL LOW (ref 4.22–5.81)
RDW: 13.6 % (ref 11.5–15.5)
WBC: 11.8 10*3/uL — ABNORMAL HIGH (ref 4.0–10.5)

## 2017-10-19 LAB — COMPREHENSIVE METABOLIC PANEL
ALBUMIN: 2.5 g/dL — AB (ref 3.5–5.0)
ALK PHOS: 117 U/L (ref 38–126)
ALT: 35 U/L (ref 0–44)
AST: 24 U/L (ref 15–41)
Anion gap: 10 (ref 5–15)
BILIRUBIN TOTAL: 0.4 mg/dL (ref 0.3–1.2)
BUN: 18 mg/dL (ref 8–23)
CO2: 28 mmol/L (ref 22–32)
CREATININE: 0.91 mg/dL (ref 0.61–1.24)
Calcium: 9.1 mg/dL (ref 8.9–10.3)
Chloride: 97 mmol/L — ABNORMAL LOW (ref 98–111)
GFR calc Af Amer: >60 mL/min (ref >60)
GLUCOSE: 260 mg/dL — AB (ref 70–99)
Potassium: 4.4 mmol/L (ref 3.5–5.1)
Sodium: 135 mmol/L (ref 135–145)
TOTAL PROTEIN: 7.3 g/dL (ref 6.5–8.1)

## 2017-10-19 LAB — RAPID URINE DRUG SCREEN, HOSP PERFORMED
Amphetamines: NOT DETECTED
BENZODIAZEPINES: NOT DETECTED
Barbiturates: NOT DETECTED
Cocaine: NOT DETECTED
Tetrahydrocannabinol: NOT DETECTED

## 2017-10-19 LAB — I-STAT CG4 LACTIC ACID, ED: Lactic Acid, Venous: 1.68 mmol/L (ref 0.5–1.9)

## 2017-10-19 LAB — BRAIN NATRIURETIC PEPTIDE: B Natriuretic Peptide: 98.3 pg/mL (ref 0.0–100.0)

## 2017-10-19 LAB — ETHANOL

## 2017-10-19 LAB — LIPASE, BLOOD: Lipase: 41 U/L (ref 11–51)

## 2017-10-19 MED ORDER — HYDROCODONE-ACETAMINOPHEN 5-325 MG PO TABS
1.0000 | ORAL_TABLET | Freq: Once | ORAL | Status: AC
  Administered 2017-10-19: 1 via ORAL
  Filled 2017-10-19: qty 1

## 2017-10-19 MED ORDER — OXYCODONE HCL 5 MG PO TABS
5.0000 mg | ORAL_TABLET | Freq: Four times a day (QID) | ORAL | Status: DC | PRN
  Administered 2017-10-19 – 2017-10-21 (×5): 5 mg via ORAL
  Filled 2017-10-19 (×5): qty 1

## 2017-10-19 MED ORDER — OXYCODONE-ACETAMINOPHEN 10-325 MG PO TABS: 1.0000 | ORAL_TABLET | Freq: Four times a day (QID) | ORAL | Status: DC | PRN

## 2017-10-19 MED ORDER — OXYCODONE-ACETAMINOPHEN 5-325 MG PO TABS
1.0000 | ORAL_TABLET | Freq: Four times a day (QID) | ORAL | Status: DC | PRN
  Administered 2017-10-19 – 2017-10-21 (×5): 1 via ORAL
  Filled 2017-10-19 (×5): qty 1

## 2017-10-19 NOTE — Evaluation (Signed)
Physical Therapy Evaluation Patient Details Name: Tyrone BaileyMark Cunningham MRN: [id number] DOB: 02/05/1956 Today's Date: 10/19/2017   History of Present Illness  Tyrone Cunningham is a 62 y.o. male with a hx of CHF, COPD, type 2 diabetes, hypertension, severe sleep apnea requiring tracheostomy, LLE cellulitis presents to the Emergency Department via EMS with complaints of a fall.  Clinical Impression  Patient presents with decreased independence and safety with mobility with recent fall and currently mod A for bed to chair transfers.  Feel he will benefit from skilled PT in the acute setting to allow maximized mobility prior to d/c to SNF level rehab.      Follow Up Recommendations SNF;Supervision/Assistance - 24 hour    Equipment Recommendations  None recommended by PT    Recommendations for Other Services       Precautions / Restrictions Precautions Precautions: Fall Precaution Comments: trach dependent      Mobility  Bed Mobility Overal bed mobility: Needs Assistance Bed Mobility: Supine to Sit;Sit to Supine     Supine to sit: Mod assist;HOB elevated Sit to supine: Mod assist   General bed mobility comments: assist for lifting trunk, assist for legs to supine  Transfers Overall transfer level: Needs assistance Equipment used: 1 person hand held assist Transfers: Sit to/from Stand;Stand Pivot Transfers Sit to Stand: Mod assist Stand pivot transfers: Mod assist       General transfer comment: assist for balance, safety and due to pt c/o fear of falling  Ambulation/Gait                Stairs            Wheelchair Mobility    Modified Rankin (Stroke Patients Only)       Balance Overall balance assessment: Needs assistance Sitting-balance support: Feet supported Sitting balance-Leahy Scale: Fair Sitting balance - Comments: on Edge of stretcher and in chair intermittent S                                     Pertinent Vitals/Pain Pain Assessment:  Faces Faces Pain Scale: Hurts even more Pain Location: nose, areas of abrasions from fall (generalized) Pain Descriptors / Indicators: Sore Pain Intervention(s): Monitored during session;Limited activity within patient's tolerance    Home Living Family/patient expects to be discharged to:: Other (Comment)                 Additional Comments: living in a motel situation    Prior Function Level of Independence: Independent with assistive device(s)         Comments: power w/c and using furniture to get into bathroom     Hand Dominance   Dominant Hand: Right    Extremity/Trunk Assessment   Upper Extremity Assessment Upper Extremity Assessment: Generalized weakness    Lower Extremity Assessment Lower Extremity Assessment: Generalized weakness(edema in bilat LE's)       Communication   Communication: Tracheostomy(self occludes to phonate)  Cognition Arousal/Alertness: Awake/alert Behavior During Therapy: Anxious Overall Cognitive Status: Within Functional Limits for tasks assessed                                        General Comments General comments (skin integrity, edema, etc.): feels unsafe to return to home environment    Exercises     Assessment/Plan    PT  Assessment Patient needs continued PT services  PT Problem List Decreased strength;Pain;Decreased activity tolerance;Decreased balance;Decreased mobility;Decreased safety awareness;Decreased knowledge of use of DME       PT Treatment Interventions DME instruction;Therapeutic activities;Gait training;Therapeutic exercise;Patient/family education;Balance training;Functional mobility training    PT Goals (Current goals can be found in the Care Plan section)  Acute Rehab PT Goals Patient Stated Goal: to feel better PT Goal Formulation: With patient Time For Goal Achievement: 11/02/17 Potential to Achieve Goals: Fair    Frequency Min 2X/week   Barriers to discharge Decreased  caregiver support      Co-evaluation               AM-PAC PT "6 Clicks" Daily Activity  Outcome Measure Difficulty turning over in bed (including adjusting bedclothes, sheets and blankets)?: A Lot Difficulty moving from lying on back to sitting on the side of the bed? : Unable Difficulty sitting down on and standing up from a chair with arms (e.g., wheelchair, bedside commode, etc,.)?: Unable Help needed moving to and from a bed to chair (including a wheelchair)?: A Lot Help needed walking in hospital room?: Total Help needed climbing 3-5 steps with a railing? : Total 6 Click Score: 8    End of Session Equipment Utilized During Treatment: Gait belt;Oxygen Activity Tolerance: Patient limited by pain;Patient limited by fatigue Patient left: in bed;with call bell/phone within reach Nurse Communication: Mobility status PT Visit Diagnosis: Muscle weakness (generalized) (M62.81);History of falling (Z91.81);Other abnormalities of gait and mobility (R26.89)    Time: 9811-91471412-1444 PT Time Calculation (min) (ACUTE ONLY): 32 min   Charges:   PT Evaluation $PT Eval High Complexity: 1 High PT Treatments $Therapeutic Activity: 8-22 mins        Tyrone Cunningham, [place] [phone] 10/19/2017   Tyrone Cunningham 10/19/2017, 4:22 PM

## 2017-10-19 NOTE — Progress Notes (Signed)
[name] [id number].  [name] will continue to follow for D/C needs.  Dorothe PeaJonathan F. Macarthur Lorusso, LCSW, LCAS, CSI Clinical Social Worker Ph: [phone]

## 2017-10-19 NOTE — Progress Notes (Addendum)
[name] spoke to the EPD who will place a consult for CM with an order for face-to-face and an order for RN/Aide/PT/OT and Social Work. 1st shift RN CM is aware consult will be placed.  [name] will continue to follow for D/C needs.  Dorothe PeaJonathan F. Vanden Fawaz, LCSW, LCAS, CSI Clinical Social Worker Ph: [phone]

## 2017-10-19 NOTE — ED Notes (Signed)
Pt up to bedside commode. Pt very unsteady on his feet, requiring assistance of 3 staff  Members.

## 2017-10-19 NOTE — ED Notes (Signed)
Pt provided with trach care kit and able to brush out tracheostomy.  Still requesting suctioning. RT notified.

## 2017-10-19 NOTE — Discharge Instructions (Addendum)
There is a nondisplaced nasal bone fracture noted on CT.  This is typically something that will heal on its own.  Should you begin to have difficulty with good airflow through the nose, please follow-up with the ear nose and throat specialist. Otherwise, follow-up with your primary care provider for any further management of your complaints. Return to the ED as needed.   Head Injury You have been seen today for a head injury. It does not appear to be serious at this time.  Close observation: The close observation period is usually 6 hours from the injury. This includes staying awake and having a trustworthy adult monitor you to assure your condition does not worsen. You should be in regular contact with this person and ideally, they should be able to monitor you in person.  Secondary observation: The secondary observation period is usually 24 hours from the injury. You are allowed to sleep during this time. A trustworthy adult should intermittently monitor you to assure your condition does not worsen.   Overall head injury/concussion care: Rest: Be sure to get plenty of rest. You will need more rest and sleep while you recover. Hydration: Be sure to stay well-hydrated. Pain: May use Tylenol for pain.  Return to sports and activities: In general, you may return to normal activities once symptoms have subsided, however, you would ideally be cleared by a primary care provider or other qualified medical professional prior to return to these activities.  Follow up: Follow up with the concussion clinic or your primary care provider for further management of this issue. Return: Return to the ED should you begin to have confusion, abnormal behavior, aggression, violence, or personality changes, repeated vomiting, vision loss, numbness or weakness on one side of the body, difficulty standing due to dizziness, significantly worsening pain, or any other major concerns.

## 2017-10-19 NOTE — ED Notes (Signed)
Spoke with patient regarding transportation home.  Pt requested RN to contact his brother, [name].  Attempted call, no answer.

## 2017-10-19 NOTE — Progress Notes (Signed)
Trach check completed at this time.

## 2017-10-19 NOTE — ED Notes (Signed)
Social work at bedside.  

## 2017-10-19 NOTE — ED Notes (Signed)
Pt provided urinal and ice water

## 2017-10-19 NOTE — ED Notes (Signed)
Pt transported to MRI 

## 2017-10-19 NOTE — ED Notes (Signed)
RT called to evaluate trach °

## 2017-10-19 NOTE — ED Provider Notes (Signed)
Patient seen/examined in the Emergency Department in conjunction with Midlevel Provider  Patient reports head and facial injury after fall. Exam : Awake alert, conversant on my exam.  Trach in place.  Abrasion noted to bridge of nose.  No focal weakness noted in his extremities. Plan: Patient will need trauma imaging.  He did have difficulty lying flat due to his trach, but he agreed to use trach collar oxygen to go forward with CT imaging   Zadie RhineWickline, Valor Quaintance, MD 10/19/17 0710

## 2017-10-19 NOTE — Progress Notes (Signed)
[name] will provide the pt with the following typed instructions:  "You have been referred to two Skilled Nursing Facilities for Short-Term Rehabilitation  1. [facility] Please contact [name] in admissions at ph: [phone] (Your brother's phone number has been given to her).  2. [facility] Please contact [name] in admissions at ph: [phone] (Your brother's phone number has been given to her).  Both of these facilities are attempting to place you in their facility by contacting your insurance company [employer] and requesting an insurance authorization and will contact you at home with a decision.  After you are finished with rehabilitation there, both of these facilities will attempt to seek placement for you there by using your Medicaid insurance.  You will be expected to agree to sign over your disability insurance payments and will only be able to keep $60 a month.   If you are seeking or will later seek placement and admission at an Assisted Living Facility (if you are not accepted to the above Skilled Nursing Facilities) for long-term residential care then you will need to apply for "Special Assistance Medicaid" before seeking admission into this type of facility.    To apply for special assistance Medicaid you will need to go to the [facility] Social Services office at: [address] [place], [place] [address]  [place]: [phone] - [place]: [phone]"

## 2017-10-19 NOTE — [place] FL2 (Signed)
[place] MEDICAID FL2 LEVEL OF CARE SCREENING TOOL     IDENTIFICATION  Patient Name: Tyrone Cunningham Birthdate: 10/31/1955 Sex: male Admission Date (Current Location): 10/19/2017  [place]  [name] [id number] P Facility and Address[facility]  [facility],  [address], [place] [id number]      Provider Number: [id number]  Attending Physician Name and Address:  No att. providers found  Relative Name and Phone Number:       Current Level of Care: Hospital Recommended Level of Care: Skilled Nursing Facility Prior Approval Number:    Date Approved/Denied: 10/19/17 PASRR Number: [id number]  Discharge Plan: SNF    Current Diagnoses: Patient Active Problem List   Diagnosis Date Noted  . HCAP (healthcare-associated pneumonia) 10/09/2017  . Hypokalemia 10/09/2017  . Abnormal liver function 10/09/2017  . Severe protein-calorie malnutrition (HCC) 10/09/2017  . Cellulitis and abscess of left leg 08/04/2017  . Diabetic foot ulcer (HCC) 08/04/2017  . Acute renal failure (ARF) (HCC) 08/04/2017  . SOB (shortness of breath) 09/13/2016  . Peripheral vascular disease (HCC) 08/19/2016  . Tracheostomy care (HCC) 06/28/2015  . Obstructive sleep apnea 06/28/2015  . Chronic pain syndrome 04/18/2015  . Constipation 01/07/2014  . Constipation, chronic 01/04/2014  . Chronic respiratory failure (HCC) 01/06/2013  . Edema 03/16/2012  . Obesity 10/20/2011  . 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 [name] with frequent exacerbations   . CHF (congestive heart failure) (HCC)   . Hypertension   . Hypercholesteremia   . Hearing loss   . Diabetic neuropathy (HCC)     Orientation RESPIRATION BLADDER Height & Weight     Self, Time, Situation, Place  Normal Continent Weight: 257 lb (116.6 kg) Height:  5\' 7"  (170.2 cm)  BEHAVIORAL SYMPTOMS/MOOD NEUROLOGICAL BOWEL NUTRITION STATUS      Continent Diet(Carb Modified)  AMBULATORY STATUS COMMUNICATION OF NEEDS Skin   Extensive Assist Verbally Normal                       Personal Care Assistance Level of Assistance  Bathing, Dressing Bathing Assistance: Limited assistance Feeding assistance: Independent Dressing Assistance: Limited assistance     Functional Limitations Info             SPECIAL CARE FACTORS FREQUENCY  PT (By licensed PT), OT (By licensed OT)     PT Frequency: 5 OT Frequency: 5            Contractures Contractures Info: Not present    Additional Factors Info  Code Status, Allergies Code Status Info: PRIOR Allergies Info: No known allergies           Current Medications (10/19/2017):  This is the current hospital active medication list No current facility-administered medications for this encounter.    Current Outpatient Medications  Medication Sig Dispense Refill  . albuterol (PROVENTIL HFA;VENTOLIN HFA) 108 (90 Base) MCG/ACT inhaler Inhale 2 puffs into the lungs every 6 (six) hours as needed for wheezing or shortness of breath. 3 Inhaler 0  . albuterol (PROVENTIL) (2.5 MG/3ML) 0.083% nebulizer solution Take 3 mLs (2.5 mg total) by nebulization every 6 (six) hours as needed for wheezing or shortness of breath. 360 mL 1  . atorvastatin (LIPITOR) 20 MG tablet Take 1 tablet (20 mg total) by mouth daily. 90 tablet 4  . furosemide (LASIX) 20 MG tablet Take 1 tablet (20 mg total) by mouth daily. 90 tablet 0  .  glipiZIDE (GLUCOTROL) 5 MG tablet Take 1 tablet (5 mg total) by mouth 2 (two) times daily before a meal. 180 tablet 4  . metoprolol tartrate (LOPRESSOR) 50 MG tablet Take 1.5 tablets (75 mg total) by mouth 2 (two) times daily. 270 tablet 3  . umeclidinium bromide (INCRUSE ELLIPTA) 62.5 MCG/INH AEPB Inhale 1 puff into the lungs daily. 90 each 4  . BELBUCA 75 MCG FILM Place 1 Film inside cheek 2 (two) times daily as needed.  0  . hydrOXYzine (ATARAX/VISTARIL) 25 MG tablet Take 1  tablet (25 mg total) by mouth every 6 (six) hours as needed for anxiety. 30 tablet 0  . oxyCODONE-acetaminophen (PERCOCET) 10-325 MG tablet Take 1 tablet by mouth every 6 (six) hours as needed for pain.     . OXYGEN Inhale 10 L into the lungs continuous.    . polyethylene glycol powder (GLYCOLAX/MIRALAX) powder Take 17 g by mouth daily. 578 g 3  . sulfamethoxazole-trimethoprim (BACTRIM DS,SEPTRA DS) 800-160 MG tablet Take 1 tablet by mouth every 12 (twelve) hours for 5 days. 10 tablet 0  . traMADol (ULTRAM) 50 MG tablet Take 50-100 mg by mouth 3 (three) times daily as needed for moderate pain.       Discharge Medications: Please see discharge summary for a list of discharge medications.  Relevant Imaging Results:  Relevant Lab Results:   Additional Information [id number]  Dorothe PeaJonathan F Mardell Cragg, LCSWA

## 2017-10-19 NOTE — ED Triage Notes (Signed)
Patient brought in by EMS c/o Fall. Per EMS pts roommate found him in the floor. Pt don't remember how he fell from the bed. Pt sustain abrasion on his nose.

## 2017-10-19 NOTE — ED Provider Notes (Addendum)
Tyrone Cunningham is a 62 y.o. male, presenting to the ED with injuries from a fall.  Upon my evaluation, patient is alert and oriented x4.  He was reportedly found on the floor by his roommate. He tells me that he usually gets around using a motorized wheelchair.  He is intermittently unsteady on his feet.  He states he fell out of bed this morning.  He has vomited once since the incident.  He endorses some pain and soreness to his nose and face, but does not have any other complaints.    HPI from [place]annah Muthersbaugh: "Tyrone Cunningham is a 62 y.o. male with a hx of CHF, COPD, type 2 diabetes, hypertension, severe sleep apnea requiring tracheostomy, presents to the Emergency Department via EMS with complaints of a fall.  Per EMS, patient's roommate found him on the floor and called 911.  Patient reports he cannot remember why he fell from bed.  Patient is very sleepy and answers few questions.  When asked to be hurts, he points to his nose.  On review of systems he endorses nausea and vomiting.  He verbally answers no questions.  Level 5 caveat for altered mental status"   Physical Exam  BP 137/60   Pulse 91   Temp 98.8 F (37.1 C) (Oral)   Resp (!) 24   Ht 5\' 7"  (1.702 m)   Wt 116.6 kg   SpO2 91%   BMI 40.25 kg/m   Physical Exam  Constitutional: He is oriented to person, place, and time. He appears well-developed and well-nourished. No distress.  HENT:  Head: Normocephalic.  Some dried blood noted in the left nare with no active hemorrhage.  Tenderness to the bridge of the nose without noted deformity or instability.  No noted septal hematoma.  Nares appear to be patent bilaterally. Abrasions to various areas on the rest of the face without tenderness, swelling, or instability.  Eyes: Pupils are equal, round, and reactive to light. Conjunctivae and EOM are normal.  Neck: Neck supple.  Cardiovascular: Normal rate, regular rhythm, normal heart sounds and intact distal pulses.  Pulmonary/Chest:  Effort normal and breath sounds normal. No respiratory distress.  Abdominal: Soft. There is no tenderness. There is no guarding.  Musculoskeletal: He exhibits no edema.  Lymphadenopathy:    He has no cervical adenopathy.  Neurological: He is alert and oriented to person, place, and time.  Sensation grossly intact to light touch in the extremities. Strength 5/5 in all extremities. Coordination intact. Cranial nerves III-XII grossly intact. No facial droop.  Patient was able to sit up in bed with help, but states he feels weak and that this is typical for him. He has no mention of dizziness.  Skin: Skin is warm and dry. He is not diaphoretic.  Psychiatric: He has a normal mood and affect. His behavior is normal.  Nursing note and vitals reviewed.   ED Course/Procedures   Clinical Course as of Oct 19 1624  Mon Oct 19, 2017  0527 Unable to obtain CT as pt is unable to lay flat reporting that he cannot breath   [HM]  0720 Spoke with the patient. States he is ready to go back to the CT scanner. A&Ox4.   [SJ]  1333 When the nurses were attempting to get the patient out of bed, patient was unable to stay sitting upright.  Patient previously gave me the impression that he typically has generalized weakness and has difficulty supporting himself in an upright position, however, patient then  states that he has been "swimmy headed" since he fell this morning. States he felt weak last night and a little dizzy, but worse today.  RN spoke with the patient's brother who states patient has been generally weak, intermittently confused, and complaining of dizziness since he was discharged from his last admission on August 15.   [SJ]    Clinical Course User Index [HM] Muthersbaugh, Dahlia ClientHannah, PA-C [SJ] Anselm PancoastJoy, Corda Shutt C, PA-C    Procedures  MDM   Patient care handoff report received from [facility], PA-C. Plan: Continue to observe and reassess.  CT of head, neck, and face pending.  If normal and  mental status is also normal, discharge.    Patient presents following fall to the floor.  He was initially confused with the previous provider, but is was able to answer my orientation questions correctly. He initially gives impression that he has been weakening at least over the last several days, however, it was time for discharge he began to complain of dizziness that was not positional.  Stated he did not have this dizziness last night, but has been there at least from the time he fell.  My suspicion for stroke in this patient is low, however, since it is possible that he had a cerebellar stroke causing dizziness that made him fall this morning, MRI will be obtained. Patient additionally voices a desire for long-term placement.  Social work, case management, PT, and OT assessment requests were placed.   End of shift patient care handoff report given to Ebbie Ridgehris Lawyer, PA-C. Plan: MRI pending.  If normal, coordinate with case manager and social work for possible placement.  If patient continues to be dizzy and unable to safely ambulate, patient may need to be admitted.  Patient was originally assessed by attending physician, Dr. Bebe ShaggyWickline. The care for this patient was discussed with Dr. Rhunette CroftNanavati after EDP shift change.   Vitals:   10/19/17 0930 10/19/17 1021 10/19/17 1030 10/19/17 1100  BP: (!) 182/80 (!) 161/79 (!) 172/88 (!) 169/81  Pulse: 85 83 67 67  Resp: (!) 21 15 17 17   Temp:      TempSrc:      SpO2: 99% 98% 99% 99%  Weight:      Height:          Anselm PancoastJoy, Keyvon Herter C, PA-C 10/19/17 1234    Anselm PancoastJoy, Maelee Hoot C, PA-C 10/19/17 1633    Zadie RhineWickline, Donald, MD 10/20/17 [phone]

## 2017-10-19 NOTE — NC FL2 (Addendum)
[place] MEDICAID FL2 LEVEL OF CARE SCREENING TOOL     IDENTIFICATION  Patient Name: Tyrone Cunningham Birthdate: 06/16/1955 Sex: male Admission Date (Current Location): 10/19/2017  Tyrone Cunningham:  Tyrone Cunningham Tyrone Cunningham P Facility and Address:  Tyrone Cunningham,  [place]      Provider Cunningham: Tyrone Cunningham  Attending Physician Name and Address:  No att. providers found  Relative Name and Phone Cunningham:       Current Level of Care: Cunningham Recommended Level of Care: Skilled Nursing Facility Prior Approval Cunningham:    Date Approved/Denied: 10/19/17 PASRR Cunningham: Tyrone Cunningham A  Discharge Plan: SNF    Current Diagnoses: Patient Active Problem List   Diagnosis Date Noted  . HCAP (healthcare-associated pneumonia) 10/09/2017  . Hypokalemia 10/09/2017  . Abnormal liver function 10/09/2017  . Severe protein-calorie malnutrition (HCC) 10/09/2017  . Cellulitis and abscess of left leg 08/04/2017  . Diabetic foot ulcer (HCC) 08/04/2017  . Acute renal failure (ARF) (HCC) 08/04/2017  . SOB (shortness of breath) 09/13/2016  . Peripheral vascular disease (HCC) 08/19/2016  . Tracheostomy care (HCC) 06/28/2015  . Obstructive sleep apnea 06/28/2015  . Chronic pain syndrome 04/18/2015  . Constipation 01/07/2014  . Constipation, chronic 01/04/2014  . Chronic respiratory failure (HCC) 01/06/2013  . Edema 03/16/2012  . Obesity 10/20/2011  . 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 [name] with frequent exacerbations   . CHF (congestive heart failure) (HCC)   . Hypertension   . Hypercholesteremia   . Hearing loss   . Diabetic neuropathy (HCC)     Orientation RESPIRATION BLADDER Height & Weight     Self, Time, Situation, Place  Normal Continent Weight: 257 lb (116.6 kg) Height:  5\' 7"  (170.2 cm)  BEHAVIORAL SYMPTOMS/MOOD NEUROLOGICAL BOWEL NUTRITION STATUS      Continent Diet(Carb Modified)  AMBULATORY STATUS COMMUNICATION OF NEEDS Skin   Extensive Assist Verbally Normal                       Personal Care Assistance Level of Assistance  Bathing, Dressing Bathing Assistance: Maximum assistance   Dressing Assistance: Maximum assistance     Functional Limitations Info             SPECIAL CARE FACTORS FREQUENCY  PT (By licensed PT), OT (By licensed OT)     PT Frequency: 5 OT Frequency: 5            Contractures Contractures Info: Not present    Additional Factors Info  Allergies, Code Status Code Status Info: PRIOR Allergies Info: No Known Allergies           Current Medications (10/19/2017):  This is the current Cunningham active medication list No current facility-administered medications for this encounter.    Current Outpatient Medications  Medication Sig Dispense Refill  . albuterol (PROVENTIL HFA;VENTOLIN HFA) 108 (90 Base) MCG/ACT inhaler Inhale 2 puffs into the lungs every 6 (six) hours as needed for wheezing or shortness of breath. 3 Inhaler 0  . albuterol (PROVENTIL) (2.5 MG/3ML) 0.083% nebulizer solution Take 3 mLs (2.5 mg total) by nebulization every 6 (six) hours as needed for wheezing or shortness of breath. 360 mL 1  . atorvastatin (LIPITOR) 20 MG tablet Take 1 tablet (20 mg total) by mouth daily. 90 tablet 4  . BELBUCA 75 MCG FILM Place 1 Film inside cheek 2 (two) times daily as needed.  0  .  furosemide (LASIX) 20 MG tablet Take 1 tablet (20 mg total) by mouth daily. 90 tablet 0  . glipiZIDE (GLUCOTROL) 5 MG tablet Take 1 tablet (5 mg total) by mouth 2 (two) times daily before a meal. 180 tablet 4  . hydrOXYzine (ATARAX/VISTARIL) 25 MG tablet Take 1 tablet (25 mg total) by mouth every 6 (six) hours as needed for anxiety. 30 tablet 0  . metoprolol tartrate (LOPRESSOR) 50 MG tablet Take 1.5 tablets (75 mg total) by mouth 2 (two) times daily. 270 tablet 3  . oxyCODONE-acetaminophen (PERCOCET) 10-325 MG  tablet Take 1 tablet by mouth every 6 (six) hours as needed for pain.     . OXYGEN Inhale 10 L into the lungs continuous.    . polyethylene glycol powder (GLYCOLAX/MIRALAX) powder Take 17 g by mouth daily. 578 g 3  . sulfamethoxazole-trimethoprim (BACTRIM DS,SEPTRA DS) 800-160 MG tablet Take 1 tablet by mouth every 12 (twelve) hours for 5 days. 10 tablet 0  . traMADol (ULTRAM) 50 MG tablet Take 50-100 mg by mouth 3 (three) times daily as needed for moderate pain.    Marland Kitchen. umeclidinium bromide (INCRUSE ELLIPTA) 62.5 MCG/INH AEPB Inhale 1 puff into the lungs daily. 90 each 4     Discharge Medications: Please see discharge summary for a list of discharge medications.  Relevant Imaging Results:  Relevant Lab Results:   Additional Information [id number]  Tyrone Cunningham, LCSWA
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
## 2017-10-19 NOTE — ED Notes (Signed)
Pt vomiting. Pt on telephone. Pt provided emesis bag

## 2017-10-19 NOTE — Care Management Note (Signed)
Case Management Note  Patient Details  Name: Tyrone Cunningham MRN: [id number] Date of Birth: 12/04/1955  [facility] and their assistance with LTC placement in the community.  CM [name] who reported they have been unable to reach pt via phone numbers provided.  [name] and started placement process.  Updated [name].  No further CM needs noted at this time.  Expected Discharge Date:   10/19/2017               Expected Discharge Plan:  Home w Home Health Services  Discharge planning Services  CM Consult  Post Acute Care Choice:  Home Health Choice offered to:  Patient  HH Arranged:  PT RN OT NA SLP RT SW [facility] Agency:  [facility]  Status of Service:  Completed, signed off  Rica KoyanagiKritzer, Navjot Pilgrim N, RN 10/19/2017, 2:58 PM

## 2017-10-19 NOTE — ED Notes (Signed)
Pt provided meal tray

## 2017-10-19 NOTE — ED Provider Notes (Signed)
[facility]-EMERGENCY DEPT Provider Note   CSN: [id number] Arrival date & time: 10/19/17  0418     History   Chief Complaint Chief Complaint  Patient presents with  . Fall  . Facial Injury    HPI Tyrone Cunningham is a 62 y.o. male with a hx of CHF, COPD, type 2 diabetes, hypertension, severe sleep apnea requiring tracheostomy, presents to the Emergency Department via EMS with complaints of a fall.  Per EMS, patient's roommate found him on the floor and called 911.  Patient reports he cannot remember why he fell from bed.  Patient is very sleepy and answers few questions.  When asked to be hurts, he points to his nose.  On review of systems he endorses nausea and vomiting.  He verbally answers no questions.  Level 5 caveat for altered mental status   The history is provided by the patient, medical records and the EMS personnel. No language interpreter was used.    Past Medical History:  Diagnosis Date  . Bronchitis   . CHF (congestive heart failure) (HCC)    grade 1 diastolic with preserved EF in [id number]  . COPD (chronic obstructive pulmonary disease) (HCC)   . DM type 2 with diabetic peripheral neuropathy (HCC)   . Hearing loss   . Hypercholesteremia   . Hypertension   . Peripheral neuropathy   . Sleep apnea    Severe sleep apnea requiring tracheostomy    Patient Active Problem List   Diagnosis Date Noted  . HCAP (healthcare-associated pneumonia) 10/09/2017  . Hypokalemia 10/09/2017  . Abnormal liver function 10/09/2017  . Severe protein-calorie malnutrition (HCC) 10/09/2017  . Cellulitis and abscess of left leg 08/04/2017  . Diabetic foot ulcer (HCC) 08/04/2017  . Acute renal failure (ARF) (HCC) 08/04/2017  . SOB (shortness of breath) 09/13/2016  . Peripheral vascular disease (HCC) 08/19/2016  . Tracheostomy care (HCC) 06/28/2015  . Obstructive sleep apnea 06/28/2015  . Chronic pain syndrome 04/18/2015  . Constipation 01/07/2014  . Constipation,  chronic 01/04/2014  . Chronic respiratory failure (HCC) 01/06/2013  . Edema 03/16/2012  . Obesity 10/20/2011  . 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 [name] with frequent exacerbations   . CHF (congestive heart failure) (HCC)   . Hypertension   . Hypercholesteremia   . Hearing loss   . Diabetic neuropathy [facility](HCC)     Past Surgical History:  Procedure Laterality Date  . APPENDECTOMY    . CHOLECYSTECTOMY N/A 01/05/2014   Procedure: LAPAROSCOPIC CHOLECYSTECTOMY;  Surgeon: Emelia LoronMatthew Wakefield, MD;  Location: [facility] ORS;  Service: General;  Laterality: N/A;  . FEMUR HARDWARE REMOVAL  09/01/2003   Removal of retained hardware, two interlocking distal femoral  . FEMUR SURGERY    . HAND SURGERY    . MULTIPLE EXTRACTIONS WITH ALVEOLOPLASTY N/A 05/11/2012   Procedure: MULTIPLE EXTRACTION WITH ALVEOLOPLASTY;  Surgeon: Georgia LopesScott M Jensen, DDS;  Location: [facility] OR;  Service: Oral Surgery;  Laterality: N/A;  . TRACHEOSTOMY          Home Medications    Prior to Admission medications   Medication Sig Start Date End Date Taking? Authorizing Provider  albuterol (PROVENTIL HFA;VENTOLIN HFA) 108 (90 Base) MCG/ACT inhaler Inhale 2 puffs into the lungs every 6 (six) hours as needed for wheezing or shortness of breath. 09/10/17  Yes Weber, [name], PA-C  albuterol (PROVENTIL) (2.5 MG/3ML) 0.083% nebulizer solution Take 3 mLs (2.5 mg total) by  nebulization every 6 (six) hours as needed for wheezing or shortness of breath. 09/09/17  Yes Weber, [name], PA-C  atorvastatin (LIPITOR) 20 MG tablet Take 1 tablet (20 mg total) by mouth daily. 09/09/17   Weber, Dema Severin, PA-C  furosemide (LASIX) 20 MG tablet Take 1 tablet (20 mg total) by mouth daily. 09/09/17 10/11/17  Valarie Cones, Dema Severin, PA-C  glipiZIDE (GLUCOTROL) 5 MG tablet Take 1 tablet (5 mg total) by mouth 2 (two) times daily before a meal. 09/09/17   Weber, Dema Severin, PA-C  hydrOXYzine  (ATARAX/VISTARIL) 25 MG tablet Take 1 tablet (25 mg total) by mouth every 6 (six) hours as needed for anxiety. 10/15/17   Lenox Ponds, MD  metoprolol tartrate (LOPRESSOR) 50 MG tablet Take 1.5 tablets (75 mg total) by mouth 2 (two) times daily. 09/15/17   Weber, Dema Severin, PA-C  oxyCODONE-acetaminophen (PERCOCET) 10-325 MG tablet Take 1 tablet by mouth every 6 (six) hours as needed for pain.     [provider]  OXYGEN Inhale 10 L into the lungs continuous.    [provider]  polyethylene glycol powder (GLYCOLAX/MIRALAX) powder Take 17 g by mouth daily. 09/09/17   Weber, Dema Severin, PA-C  sulfamethoxazole-trimethoprim (BACTRIM DS,SEPTRA DS) 800-160 MG tablet Take 1 tablet by mouth every 12 (twelve) hours for 5 days. 10/15/17 10/20/17  Lenox Ponds, MD  traMADol (ULTRAM) 50 MG tablet Take 50-100 mg by mouth 3 (three) times daily as needed for moderate pain.    [provider]  umeclidinium bromide (INCRUSE ELLIPTA) 62.5 MCG/INH AEPB Inhale 1 puff into the lungs daily. 09/09/17   Valarie Cones, Dema Severin, PA-C    Family History Family History  Problem Relation Age of Onset  . Coronary artery disease Father     Social History Social History   Tobacco Use  . Smoking status: Former Smoker    Packs/day: 2.00    Years: 30.00    Pack years: 60.00    Types: Cigarettes    Last attempt to quit: 07/02/1999    Years since quitting: 18.3  . Smokeless tobacco: Never Used  Substance Use Topics  . Alcohol use: No    Comment: heavy drinker, quit in 1983  . Drug use: No    Comment: used "everything but heroin, I had my days"     Allergies   Patient has no known allergies.   Review of Systems Review of Systems  Unable to perform ROS: Mental status change  HENT: Positive for facial swelling.   Gastrointestinal: Positive for nausea and vomiting.     Physical Exam Updated Vital Signs BP 137/60   Pulse 85   Temp 98.8 F (37.1 C) (Oral)   Resp 18   Ht 5\' 7"  (1.702 m)    Wt 116.6 kg   SpO2 91%   BMI 40.25 kg/m   Physical Exam  Constitutional: He appears well-developed and well-nourished. He appears lethargic. He is sleeping. No distress.  Awake, alert, nontoxic appearance  HENT:  Head: Normocephalic.  Mouth/Throat: Oropharynx is clear and moist. No oropharyngeal exudate.  No contusion to the right forehead Abrasion to the bridge of the nose Evidence of epistaxis  Eyes: Conjunctivae are normal. No scleral icterus.  Neck: Normal range of motion. Neck supple.  Trach in place with white secretions  Cardiovascular: Normal rate, regular rhythm and intact distal pulses.  Pulmonary/Chest: Effort normal. No respiratory distress. He has decreased breath sounds. He has no wheezes. He has rhonchi ( Mild, throughout).  Equal chest expansion  Abdominal: Soft. Bowel sounds are normal. He exhibits no mass. There is no tenderness. There is no rebound and no guarding.  Musculoskeletal: Normal range of motion. He exhibits edema ( 2+ pitting edema bilaterally).  Abrasions to the right knee  Neurological: He appears lethargic.  Speech is clear and goal oriented Moves extremities without ataxia  Skin: Skin is warm and dry. He is not diaphoretic.  Psychiatric: He has a normal mood and affect.  Nursing note and vitals reviewed.    ED Treatments / Results  Labs (all labs ordered are listed, but only abnormal results are displayed) Labs Reviewed  CBC WITH DIFFERENTIAL/PLATELET - Abnormal; Notable for the following components:      Result Value   WBC 11.8 (*)    RBC 4.01 (*)    Hemoglobin 11.4 (*)    HCT 35.3 (*)    Neutro Abs 9.9 (*)    All other components within normal limits  COMPREHENSIVE METABOLIC PANEL - Abnormal; Notable for the following components:   Chloride 97 (*)    Glucose, Bld 260 (*)    Albumin 2.5 (*)    All other components within normal limits  LIPASE, BLOOD  ETHANOL  URINALYSIS, ROUTINE W REFLEX MICROSCOPIC  BRAIN NATRIURETIC PEPTIDE    RAPID URINE DRUG SCREEN, HOSP PERFORMED  I-STAT CG4 LACTIC ACID, ED  I-STAT CG4 LACTIC ACID, ED    Procedures Procedures (including critical care time)  Medications Ordered in ED Medications - No data to display   Initial Impression / Assessment and Plan / ED Course  I have reviewed the triage vital signs and the nursing notes.  Pertinent labs & imaging results that were available during my care of the patient were reviewed by me and considered in my medical decision making (see chart for details).  Clinical Course as of Oct 19 649  Mon Oct 19, 2017  0527 Unable to obtain CT as pt is unable to lay flat reporting that he cannot breath   [HM]    Clinical Course User Index [HM] Stefanny Pieri, Dahlia ClientHannah, [place] JerseyPA-C    Patient presents with fall from bed for unknown reason.  Mild leukocytosis and mild anemia are noted.  Glucose is elevated with normal anion gap.  Will be obtaining head CT as patient reports he cannot breathe.  Patient was placed on a trach collar and CT will be reattempted.  Urinalysis and other labs are up.  At shift change care was transferred to [facility] Joy, PA-C who will follow pending studies, re-evaulate and determine disposition.    The patient was discussed with and seen by Dr. Bebe ShaggyWickline who agrees with the treatment plan.    Final Clinical Impressions(s) / ED Diagnoses   Final diagnoses:  Contusion of forehead, initial encounter  Fall from bed, initial encounter    ED Discharge Orders    None       Mardene SayerMuthersbaugh, Boyd KerbsHannah, PA-C 10/19/17 [id number]    Zadie RhineWickline, Donald, MD 10/19/17 [phone]

## 2017-10-19 NOTE — Progress Notes (Signed)
Trach check completed. No distress noted at this time.  
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
## 2017-10-19 NOTE — ED Notes (Addendum)
Shawn PA made aware of pt's unsteadiness. Pt up for reeval

## 2017-10-19 NOTE — ED Notes (Signed)
ED Provider at bedside. 

## 2017-10-19 NOTE — ED Notes (Signed)
RT at the bedside.
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
## 2017-10-19 NOTE — Progress Notes (Signed)
Pt was having PT when we went to pick him up for his MRI Brain, RN to call when he is finished  2:29p

## 2017-10-20 DIAGNOSIS — S0083XA Contusion of other part of head, initial encounter: Secondary | ICD-10-CM | POA: Diagnosis not present

## 2017-10-20 MED ORDER — GLIPIZIDE 5 MG PO TABS
5.0000 mg | ORAL_TABLET | Freq: Two times a day (BID) | ORAL | Status: DC
  Administered 2017-10-20 – 2017-10-21 (×4): 5 mg via ORAL
  Filled 2017-10-20 (×5): qty 1

## 2017-10-20 MED ORDER — FUROSEMIDE 40 MG PO TABS
20.0000 mg | ORAL_TABLET | Freq: Every day | ORAL | Status: DC
  Administered 2017-10-20 – 2017-10-21 (×2): 20 mg via ORAL
  Filled 2017-10-20 (×2): qty 1

## 2017-10-20 MED ORDER — ALBUTEROL SULFATE (2.5 MG/3ML) 0.083% IN NEBU
2.5000 mg | INHALATION_SOLUTION | Freq: Four times a day (QID) | RESPIRATORY_TRACT | Status: DC | PRN
  Administered 2017-10-20 – 2017-10-21 (×2): 2.5 mg via RESPIRATORY_TRACT
  Filled 2017-10-20 (×2): qty 3

## 2017-10-20 MED ORDER — TRAMADOL HCL 50 MG PO TABS
50.0000 mg | ORAL_TABLET | Freq: Three times a day (TID) | ORAL | Status: DC | PRN
Start: 2017-10-20 — End: 2017-10-22
  Administered 2017-10-20 – 2017-10-21 (×2): 50 mg via ORAL
  Administered 2017-10-22: 100 mg via ORAL
  Filled 2017-10-20: qty 2
  Filled 2017-10-20 (×2): qty 1

## 2017-10-20 MED ORDER — UMECLIDINIUM BROMIDE 62.5 MCG/INH IN AEPB
1.0000 | INHALATION_SPRAY | Freq: Every day | RESPIRATORY_TRACT | Status: DC
  Administered 2017-10-20 – 2017-10-21 (×2): 1 via RESPIRATORY_TRACT
  Filled 2017-10-20: qty 7

## 2017-10-20 MED ORDER — METOPROLOL TARTRATE 25 MG PO TABS
75.0000 mg | ORAL_TABLET | Freq: Two times a day (BID) | ORAL | Status: DC
  Administered 2017-10-20 – 2017-10-21 (×5): 75 mg via ORAL
  Filled 2017-10-20 (×4): qty 3

## 2017-10-20 MED ORDER — POLYETHYLENE GLYCOL 3350 17 G PO PACK
17.0000 g | PACK | Freq: Every day | ORAL | Status: DC
  Administered 2017-10-20 – 2017-10-21 (×2): 17 g via ORAL
  Filled 2017-10-20 (×2): qty 1

## 2017-10-20 MED ORDER — HYDROXYZINE HCL 25 MG PO TABS
25.0000 mg | ORAL_TABLET | Freq: Four times a day (QID) | ORAL | Status: DC | PRN
  Administered 2017-10-20 – 2017-10-21 (×2): 25 mg via ORAL
  Filled 2017-10-20 (×2): qty 1

## 2017-10-20 MED ORDER — TRAMADOL HCL 50 MG PO TABS: 50.0000 mg | ORAL_TABLET | Freq: Three times a day (TID) | ORAL | Status: DC | PRN

## 2017-10-20 MED ORDER — BUPRENORPHINE HCL 75 MCG BU FILM: 1.0000 | ORAL_FILM | Freq: Two times a day (BID) | BUCCAL | Status: DC | PRN

## 2017-10-20 MED ORDER — ALBUTEROL SULFATE HFA 108 (90 BASE) MCG/ACT IN AERS: 2.0000 | INHALATION_SPRAY | Freq: Four times a day (QID) | RESPIRATORY_TRACT | Status: DC | PRN

## 2017-10-20 MED ORDER — ONDANSETRON 4 MG PO TBDP
4.0000 mg | ORAL_TABLET | Freq: Once | ORAL | Status: AC
  Administered 2017-10-20: 4 mg via ORAL
  Filled 2017-10-20: qty 1

## 2017-10-20 MED ORDER — ATORVASTATIN CALCIUM 20 MG PO TABS
20.0000 mg | ORAL_TABLET | Freq: Every day | ORAL | Status: DC
  Administered 2017-10-20 – 2017-10-21 (×2): 20 mg via ORAL
  Filled 2017-10-20 (×2): qty 1

## 2017-10-20 MED ORDER — BUPRENORPHINE HCL-NALOXONE HCL 8-2 MG SL SUBL
1.0000 | SUBLINGUAL_TABLET | Freq: Two times a day (BID) | SUBLINGUAL | Status: DC | PRN
  Administered 2017-10-21: 1 via SUBLINGUAL
  Filled 2017-10-20: qty 1

## 2017-10-20 MED ORDER — POLYETHYLENE GLYCOL 3350 17 GM/SCOOP PO POWD
17.0000 g | Freq: Every day | ORAL | Status: DC
  Filled 2017-10-20: qty 255

## 2017-10-20 NOTE — ED Notes (Signed)
Pt denies any needs.  Sitting on side of ned, comfortable.

## 2017-10-20 NOTE — ED Notes (Signed)
Pt requesting breathing tx stating "I usually get 1 every morning".  RT informed of pt's request.

## 2017-10-20 NOTE — ED Notes (Signed)
Pt was given lunch meal tray.

## 2017-10-20 NOTE — ED Provider Notes (Signed)
Patient's chart reviewed with Dr. Wilkie AyeHorton. Initially seen by day shift 10/19/17 after unwitnessed fall, found by roommate. Patient with history of COPD, CHF, T2DM, HTN, severe sleep apnea requiring tracheostomy. On evaluation in the ED, he reports progressive weakness over a long period, new dizziness that he describes as "swimmy headed".   No initial and serial neuro exams, he is found to be neurologically intact. He reports dizziness when he sits up. CT scans ordered for evaluation of unwitnessed fall and possible AMS show:  IMPRESSION: .  1. No evidence of acute intracranial abnormality. Atrophy, probable chronic small-vessel white matter ischemic changes and RIGHT occipital encephalomalacia/remote infarct. 2. Nondisplaced anterior LEFT nasal fracture. 3. No static evidence of acute injury to the cervical spine. Congenital C2-3 fusion. 4. Soft tissue/fluid within LEFT mastoid air cells with bone loss. Correlate clinically, especially with surgical history.  Findings are not consistent with cerebellar infarct that would correlate with dizziness. MRI had been ordered to further evaluate however this was complicated by the fact he has a metal trach.  Per Dr. Wilkie AyeHorton, the patient does not require an emergent MRI to evaluate dizziness.   Secondary plan during his ED course was social and case worker involvement to attempt long term placement into a facility per patient's preference and request.   SW/CM consultation requested to facilitate placement or implement plan for placement. Patient has been seen by SW, CM, PT and continued to be followed by SW (Rifey).   On last notes:   - FL-2 had been completed;  - [facility] and [facility] have been contacted, consideration for short term rehab to improve ability and safety in transfers/mobility;  - SW (note of 19:43) states patient will stay in TCU overnight for safety concerns and case will be handed off to 1st SW. J. Riffey (2nd shift SW) stated he  would make sure 1st shift SW aware.  - per note of J. Riffey (20:11) EDP to have placed a "consult for CM with order for face-to-face and an order fo rRN/Aide/PT/OP and social work"  Ultimately, I have placed a consult (re-consult) order for the patient to be evaluated again by CM and SW to continue to work toward a disposition that is appropriate for this patient.    Elpidio AnisUpstill, Reya Aurich, PA-C 10/20/17 0700    Wilkie AyeHorton, Mayer Maskerourtney F, MD 10/22/17 [phone]

## 2017-10-20 NOTE — Progress Notes (Addendum)
[name] spoke to [facility] who stated the pt's authorization is still pending and they will call when a determination is made by the pt's insurance [facility] Medicare.  [name] was under the impression that [facility].  Per [facility] reviewing the referral now and the pt may not be accepted until 8/20 or 8/21.  [name] spoke to [facility] at admissions at ph: [phone] and asked if the referral had been accepted and that the insurance authorization is still pending but that [facility] would call the [name] back tonight if the pt's Berkley Harveyauth was approved and work to accept the pt for transport tonight if so on 8/20.   [name] awaiting return call from either facility.  [name] will update the RN.  6:47 PM RN updated.  8:52 PM [name] heard from [facility] [name] who stated authorization had not been completed yet and they are available to accept the in the morning once [facility] the pt's insurance days there.  2nd shift ED [name] will leave handoff for 1st shift ED [name].  [name] will continue to follow for D/C needs.  Dorothe PeaJonathan F. Soua Caltagirone, LCSW, LCAS, CSI Clinical Social Worker Ph: [phone]

## 2017-10-20 NOTE — ED Notes (Signed)
Gave report to Consuella LoseElaine, Charity fundraiserN for [facility]. Trans[facility] the [facility] and his belongings [facility] to [facility]. Patient was drowsy during transport.

## 2017-10-20 NOTE — ED Notes (Addendum)
Pt stated "I can tolerate my pain at a 4 or 5".  Pt is irritable and aggressive verbally when questioned to obtain further information.   Attempt made to communicate further, pt will state "so now we're on the same page".

## 2017-10-20 NOTE — Evaluation (Signed)
Occupational Therapy Evaluation Patient Details Name: Tyrone BaileyMark Cunningham MRN: [id number] DOB: 08/16/1955 Today's Date: 10/20/2017    History of Present Illness Tyrone BaileyMark Cunningham is a 62 y.o. male with a hx of CHF, COPD, type 2 diabetes, hypertension, severe sleep apnea requiring tracheostomy, LLE cellulitis presents to the Emergency Department via EMS with complaints of a fall.   Clinical Impression   Pt was admitted for the above. He reports that he was mod I with adls at baseline. Pt was limited by nausea once he sat up; OT returned later in am and he continued to feel poorly so evaluation was limited. Will follow in acute setting with set up to min A level goals    Follow Up Recommendations  SNF    Equipment Recommendations  3 in 1 bedside commode(likely)    Recommendations for Other Services       Precautions / Restrictions Precautions Precautions: Fall Precaution Comments: trach dependent Restrictions Weight Bearing Restrictions: No      Mobility Bed Mobility               General bed mobility comments: sidelying to sit; min guard use of bedrail  Transfers                 General transfer comment: not tested:  mod A during PT evaluation    Balance     Sitting balance-Leahy Scale: Fair                                     ADL either performed or assessed with clinical judgement   ADL Overall ADL's : Needs assistance/impaired                                       General ADL Comments: Pt was limited by nausea after he sat up.  ADLs, based on clinical judgment:  set up for grooming, min A for UB and max A for LB. Did not stand this session, but he needed mod A with PT yesterday     Vision         Perception     Praxis      Pertinent Vitals/Pain Pain Assessment: No/denies pain     Hand Dominance     Extremity/Trunk Assessment Upper Extremity Assessment Upper Extremity Assessment: Generalized weakness            Communication Communication Communication: Tracheostomy   Cognition Arousal/Alertness: Awake/alert Behavior During Therapy: WFL for tasks assessed/performed                                   General Comments: appears wfls   General Comments  evaluation limited by nausea/not feeling well    Exercises     Shoulder Instructions      Home Living Family/patient expects to be discharged to:: Other (Comment)                                 Additional Comments: living in a motel situation      Prior Functioning/Environment Level of Independence: Independent with assistive device(s)        Comments: power w/c and using furniture to get into bathroom  OT Problem List: Decreased strength;Decreased activity tolerance;Decreased knowledge of use of DME or AE(balance NT)      OT Treatment/Interventions: Self-care/ADL training;Energy conservation;DME and/or AE instruction;Therapeutic activities;Patient/family education;Balance training    OT Goals(Current goals can be found in the care plan section) Acute Rehab OT Goals Patient Stated Goal: to feel better OT Goal Formulation: With patient Time For Goal Achievement: 11/03/17 Potential to Achieve Goals: Good ADL Goals Pt Will Transfer to Toilet: with min assist;stand pivot transfer;bedside commode Pt Will Perform Toileting - Clothing Manipulation and hygiene: with min assist;sit to/from stand Additional ADL Goal #1: pt will perform UB adls with set up Additional ADL Goal #2: pt will perform LB adls with min A, using AE as needed, sit to stand  OT Frequency: Min 2X/week   Barriers to D/C:            Co-evaluation              AM-PAC PT "6 Clicks" Daily Activity     Outcome Measure Help from another person eating meals?: None Help from another person taking care of personal grooming?: A Little Help from another person toileting, which includes using toliet, bedpan, or urinal?: A  Lot Help from another person bathing (including washing, rinsing, drying)?: A Little Help from another person to put on and taking off regular upper body clothing?: A Little Help from another person to put on and taking off regular lower body clothing?: A Lot 6 Click Score: 17   End of Session    Activity Tolerance: (nausea) Patient left: in bed;with call bell/phone within reach  OT Visit Diagnosis: Muscle weakness (generalized) (M62.81)                Time: 1191-47821044-1052 OT Time Calculation (min): 8 min Charges:  OT General Charges $OT Visit: 1 Visit OT Evaluation $OT Eval Low Complexity: 1 Low  Marica OtterMaryellen Amahri Dengel, OTR/L [phone] 10/20/2017  [place] 10/20/2017, 12:56 PM

## 2017-10-20 NOTE — ED Notes (Signed)
Patient actively vomiting at bedside.

## 2017-10-20 NOTE — ED Notes (Signed)
Pt resting comfortably with eyes closed

## 2017-10-20 NOTE — ED Notes (Signed)
Bed: [id number] Expected date:  Expected time:  Means of arrival:  Comments: Hold for rm 10

## 2017-10-20 NOTE — ED Notes (Signed)
Pt denies any needs, resting comfortably in bed.  Res[iratory visited pt, setting on 25%.

## 2017-10-20 NOTE — ED Notes (Signed)
Patient sitting up eating his dinner. Appears in no acute distress.

## 2017-10-20 NOTE — Progress Notes (Signed)
[name] aware patient was held overnight for safety. [name] spoke with Olegario MessierKathy of [facility] who stated that patient should receive insurance authorization and be able to transport to facility today. [name] will continue to follow.  [name], LCSWA  Clinical Social Work Department  [facility]  [phone]

## 2017-10-21 DIAGNOSIS — S0083XA Contusion of other part of head, initial encounter: Secondary | ICD-10-CM | POA: Diagnosis not present

## 2017-10-21 NOTE — Progress Notes (Signed)
[name] received a call from [name] Pt has been accepted by: [facility] Number for report is: [phone] Pt's unit/room/bed number will be: 128-A Accepting physician: SNF MD  Pt can arrive ASAP on 10/21/17  [name] will update RN and EDP.  Dorothe PeaJonathan F. Alfa Leibensperger, LCSW, LCAS, CSI Clinical Social Worker Ph: [phone]

## 2017-10-21 NOTE — ED Notes (Signed)
Respiratory at bedside.

## 2017-10-21 NOTE — Progress Notes (Signed)
Pt is no longer accepted by [facility]  [name] followed up on referral made by the [name] to [facility].  [name] received a call from TatumsSheila Pt [facility] been [facility] Number for report is: [phone] Pt's unit/room/bed number [facility] be: [facility] Accepting physician: Dr. Nehemiah SettlePolite  Pt can arrive ASAP on 8/21 (today)  [name] will update RN and EDP.  Dorothe PeaJonathan F. Tassie Pollett, LCSW, LCAS, CSI Clinical Social Worker Ph: [phone]

## 2017-10-21 NOTE — ED Notes (Signed)
Trach mask changed

## 2017-10-21 NOTE — Progress Notes (Signed)
Physical Therapy Treatment Patient Details Name: Tyrone Cunningham Hanneman MRN: [id number] DOB: 06/13/1955 Today's Date: 10/21/2017    History of Present Illness Tyrone Cunningham Neumeyer is a 62 y.o. male with a hx of CHF, COPD, type 2 diabetes, hypertension, severe sleep apnea requiring tracheostomy, LLE cellulitis presents to the Emergency Department via EMS with complaints of a fall.    PT Comments    Patient progressing with mobility needing less assist this session, but still unsafe for independent transfers due to imbalance and generalized weakness.  He continues to be a good candidate for SNF level rehab upon d/c.   Follow Up Recommendations  SNF;Supervision/Assistance - 24 hour     Equipment Recommendations  None recommended by PT    Recommendations for Other Services       Precautions / Restrictions Precautions Precautions: Fall Precaution Comments: trach dependent    Mobility  Bed Mobility         Supine to sit: HOB elevated;Supervision     General bed mobility comments: assist for safety  Transfers Overall transfer level: Needs assistance Equipment used: None Transfers: Stand Pivot Transfers;Sit to/from Stand Sit to Stand: Supervision Stand pivot transfers: Min assist       General transfer comment: able to get to recliner wtih min A for safety due to overshooting seat, stood at sink for replacing pad in chair pulling up on sink  Ambulation/Gait                 Stairs             Wheelchair Mobility    Modified Rankin (Stroke Patients Only)       Balance Overall balance assessment: Needs assistance Sitting-balance support: Feet supported Sitting balance-Leahy Scale: Good Sitting balance - Comments: sat edge of recliner at sink to wash trach brush and cleanse trach   Standing balance support: Bilateral upper extremity supported Standing balance-Leahy Scale: Poor Standing balance comment:  standing briefly to replace pad in seat of recliner with UE support  on sink                            Cognition Arousal/Alertness: Awake/alert Behavior During Therapy: WFL for tasks assessed/performed Overall Cognitive Status: Within Functional Limits for tasks assessed                                        Exercises      General Comments General comments (skin integrity, edema, etc.): Noted L beating nystagmus when pt in semi reclined position; denies dizziness/spinning, but still slight nausea; modified hall pike testing revealed two to three beats rotary nystagmus direction changing and consistent in R vs L hallpike position, pt without c/o any symtpoms except mild nausea with supine to sit or just sitting EOB      Pertinent Vitals/Pain Pain Assessment: No/denies pain    Home Living                      Prior Function            PT Goals (current goals can now be found in the care plan section) Progress towards PT goals: Progressing toward goals    Frequency           PT Plan Current plan remains appropriate    Co-evaluation  AM-PAC PT "6 Clicks" Daily Activity  Outcome Measure  Difficulty turning over in bed (including adjusting bedclothes, sheets and blankets)?: A Little Difficulty moving from lying on back to sitting on the side of the bed? : A Little Difficulty sitting down on and standing up from a chair with arms (e.g., wheelchair, bedside commode, etc,.)?: A Lot Help needed moving to and from a bed to chair (including a wheelchair)?: A Little Help needed walking in hospital room?: Total Help needed climbing 3-5 steps with a railing? : Total 6 Click Score: 13    End of Session Equipment Utilized During Treatment: Oxygen Activity Tolerance: Patient tolerated treatment well Patient left: in chair;with call bell/phone within reach   PT Visit Diagnosis: Muscle weakness (generalized) (M62.81);History of falling (Z91.81);Other abnormalities of gait and mobility  (R26.89)     Time: 1445-1520 PT Time Calculation (min) (ACUTE ONLY): 35 min  Charges:  $Therapeutic Activity: 23-37 mins                     Laroseyndi Wynn, [place] [phone] 10/21/2017    Elray Mcgregorynthia Wynn 10/21/2017, 4:29 PM

## 2017-10-21 NOTE — Progress Notes (Signed)
[name] received a call from [facility] admission coordinator stating their nursing staff felt unprepared to care for pt due to pt's trach being made of metal as well as having continuous oxygen.  [name] spoke to SabinaSheila at [facility] who stated they could accept the pt, after conferring with nursing staff there that the pt's trach was acceptable.  RN/EDP updated.  Please reconsult if future social work needs arise.  [name] signing off, as social work intervention is no longer needed.  Dorothe PeaJonathan F. Hagop Mccollam, LCSW, LCAS, CSI Clinical Social Worker Ph: [phone]

## 2017-10-21 NOTE — ED Notes (Signed)
Per day RN, Job FoundsKaitlin Zuleta, PTAR was called twice during the day to make sure that patient was on list to be [facility]. I called again just now to make sure patient was still on the list to be [facility] to [facility]. Per PTAR, he is approximately the third on the list and they will be her asap to transport the patient. Per PTAR, there are only 2 trucks transporting patients tonight and they are very behind.

## 2017-10-21 NOTE — ED Notes (Signed)
[phone] Fax number for Nurses station

## 2017-10-21 NOTE — Progress Notes (Addendum)
[name] spoke with patient at bedside. Patient now stating that he wants to return to the motel with his roommate. Per patient, he and his roommate worked things out. [name] spoke with brother, [name] ph:[phone], who stated that he's not sure patient can return to the motel. [name] informed by [name] that he is going to reach out to roommate to see if patient is able to return and will update [name]. [name] will await return call.  2:52pm- [name] informed by brother [name] that patient's roommate does not want to take patient back in this condition. Patient now stating he'll go to a facility. [name] informed by [facility] that they have received insurance authorization. [name] has attempted to contact [name] with [facility] [phone] regarding disposition plan.  3:13pm- [name] also informed by [name] at [facility] that they have received insurance authorization. [name] unsure how both facilities obtained authorization. [name] to leave handoff for 2nd shift ED [name].  [name], LCSWA  Clinical Social Work Department  [facility]  [phone]

## 2017-10-21 NOTE — ED Provider Notes (Addendum)
[facility].  Patient no complaints upon my evaluation and discharged in ED in good condition.  Update, patient now going to [facility]   Tyrone Cunningham, Madelaine Bhatdam, DO 10/21/17 1601    Virgina Norfolkuratolo, Chandel Zaun, DO 10/21/17 1811

## 2017-10-21 NOTE — ED Notes (Signed)
Per Social Worker: Pt has been accepted by [facility] Number for Report: [phone] Pt's Bed: 128-A Pt can arrive ASAP on 8/21

## 2017-10-22 DIAGNOSIS — S0083XA Contusion of other part of head, initial encounter: Secondary | ICD-10-CM | POA: Diagnosis not present

## 2017-10-22 NOTE — ED Notes (Signed)
Staff at [facility] made aware that patient was accepted to their facility and discharged from ours. Care giver on phone verbalized understanding.

## 2017-10-22 NOTE — ED Notes (Signed)
PTAR arrived at 0324 to pick up patient to transport to [facility]. Called [facility] to let them know that patient was on their way to the facility. Staff that answered the phone first stated that there was no one at this time to receive the patient. I stated that the patient was discharged. The staff member placed me on hold for 24 minutes. Called charge RN, Leta JunglingJake, to ask what to do. He called and stated the patient was on the way and gave report. PTAR currently getting patient to stretcher to transport patient.

## 2017-10-23 ENCOUNTER — Other Ambulatory Visit: Payer: Self-pay | Admitting: Physician Assistant

## 2017-10-23 DIAGNOSIS — J449 Chronic obstructive pulmonary disease, unspecified: Secondary | ICD-10-CM

## 2017-10-30 ENCOUNTER — Ambulatory Visit: Payer: Medicare Other | Admitting: Physician Assistant

## 2017-11-03 ENCOUNTER — Telehealth: Payer: Self-pay | Admitting: Physician Assistant

## 2017-11-03 DIAGNOSIS — J449 Chronic obstructive pulmonary disease, unspecified: Secondary | ICD-10-CM

## 2017-11-03 MED ORDER — ALBUTEROL SULFATE (2.5 MG/3ML) 0.083% IN NEBU: 2.5000 mg | INHALATION_SOLUTION | Freq: Four times a day (QID) | RESPIRATORY_TRACT | 0 refills | Status: DC | PRN

## 2017-11-03 NOTE — Telephone Encounter (Signed)
Copied from CRM [id number]. Topic: Quick Communication - Rx Refill/Question >> Nov 03, 2017  3:43 PM Ronney Lion A wrote: Medication:  albuterol nebulizer  Has the patient contacted their pharmacy? yes   Agent: If yes, when and what did the pharmacy advise? Last filled aug 3rd, requesting refills.   Preferred Pharmacy (with phone number or street name): [facility] [address] [place] - [address] [phone] (Phone)

## 2017-11-03 NOTE — Telephone Encounter (Signed)
Albuterol neb refill Last refill7/10/19   1 refill PCP S. [facility] LOV7/10/19 [facility]acy [facility]  Per OV of 7/10 pt was to follow up in 1 month. Will give 1 month supply. Pt need follow-up.

## 2017-11-03 NOTE — Telephone Encounter (Unsigned)
Copied from CRM #[id number]. Topic: Quick Communication - Rx Refill/Question °>> Nov 03, 2017  3:43 PM Arrington, Shykila A wrote: °Medication:  albuterol nebulizer ° °Has the patient contacted their pharmacy? yes  ° °Agent: If yes, when and what did the pharmacy advise? Last filled aug 3rd, requesting refills.  ° °Preferred Pharmacy (with phone number or street name): [facility] - [place], [place] - [address] [phone] (Phone) °

## 2017-11-05 ENCOUNTER — Other Ambulatory Visit: Payer: Self-pay | Admitting: Physician Assistant

## 2017-11-05 DIAGNOSIS — J449 Chronic obstructive pulmonary disease, unspecified: Secondary | ICD-10-CM

## 2017-11-23 ENCOUNTER — Other Ambulatory Visit: Payer: Self-pay | Admitting: Physician Assistant

## 2017-11-23 DIAGNOSIS — I509 Heart failure, unspecified: Secondary | ICD-10-CM

## 2017-11-23 NOTE — Telephone Encounter (Signed)
Attempted to call patient, phone did not ring, automated message says "the person you are trying to reach is not accepting calls at this time, try your call again later." Patient will need to establish care with another provider.  Furosemide refill Last refill:09/09/17 #90/0 refill Last OV:09/09/17 MWU:XLKGPCP:None listed; last [facility] [place] - [id number] [address] [phone] (Phone) [phone] (Fax)

## 2018-03-31 ENCOUNTER — Telehealth: Payer: Self-pay | Admitting: Family Medicine

## 2018-03-31 NOTE — Telephone Encounter (Signed)
Copied from CRM [id number]. Topic: General - Other >> Mar 31, 2018 12:10 PM Darletta Moll L wrote: Reason for CRM: Patient needs an AWV and an appointment to switch to a current provider of [place] and would preferably like them done on the same day. Advised per flow the health coach Raynelle Fanning calls to schedule patients for AWV. Contact number listed is his brother [name] because patient does not have a phone right now.

## 2018-11-26 ENCOUNTER — Emergency Department (HOSPITAL_COMMUNITY)
Admission: EM | Admit: 2018-11-26 | Discharge: 2018-11-26 | Disposition: A | Discharge status: Home / Self Care | Payer: Medicare Other | Attending: Emergency Medicine | Admitting: Emergency Medicine

## 2018-11-26 ENCOUNTER — Emergency Department (HOSPITAL_COMMUNITY): Payer: Medicare Other

## 2018-11-26 ENCOUNTER — Other Ambulatory Visit: Payer: Self-pay

## 2018-11-26 ENCOUNTER — Encounter (HOSPITAL_COMMUNITY): Payer: Self-pay

## 2018-11-26 DIAGNOSIS — S50811A Abrasion of right forearm, initial encounter: Secondary | ICD-10-CM | POA: Diagnosis not present

## 2018-11-26 DIAGNOSIS — E119 Type 2 diabetes mellitus without complications: Secondary | ICD-10-CM | POA: Insufficient documentation

## 2018-11-26 DIAGNOSIS — J449 Chronic obstructive pulmonary disease, unspecified: Secondary | ICD-10-CM | POA: Insufficient documentation

## 2018-11-26 DIAGNOSIS — Z79899 Other long term (current) drug therapy: Secondary | ICD-10-CM | POA: Diagnosis not present

## 2018-11-26 DIAGNOSIS — Y92129 Unspecified place in nursing home as the place of occurrence of the external cause: Secondary | ICD-10-CM | POA: Insufficient documentation

## 2018-11-26 DIAGNOSIS — S0990XA Unspecified injury of head, initial encounter: Secondary | ICD-10-CM | POA: Diagnosis present

## 2018-11-26 DIAGNOSIS — Z87891 Personal history of nicotine dependence: Secondary | ICD-10-CM | POA: Insufficient documentation

## 2018-11-26 DIAGNOSIS — I509 Heart failure, unspecified: Secondary | ICD-10-CM | POA: Insufficient documentation

## 2018-11-26 DIAGNOSIS — I110 Hypertensive heart disease with heart failure: Secondary | ICD-10-CM | POA: Diagnosis not present

## 2018-11-26 DIAGNOSIS — Y999 Unspecified external cause status: Secondary | ICD-10-CM | POA: Diagnosis not present

## 2018-11-26 DIAGNOSIS — Y939 Activity, unspecified: Secondary | ICD-10-CM | POA: Insufficient documentation

## 2018-11-26 DIAGNOSIS — S0081XA Abrasion of other part of head, initial encounter: Secondary | ICD-10-CM | POA: Diagnosis not present

## 2018-11-26 DIAGNOSIS — W19XXXA Unspecified fall, initial encounter: Secondary | ICD-10-CM

## 2018-11-26 DIAGNOSIS — M25562 Pain in left knee: Secondary | ICD-10-CM | POA: Diagnosis not present

## 2018-11-26 DIAGNOSIS — Z23 Encounter for immunization: Secondary | ICD-10-CM | POA: Diagnosis not present

## 2018-11-26 DIAGNOSIS — W010XXA Fall on same level from slipping, tripping and stumbling without subsequent striking against object, initial encounter: Secondary | ICD-10-CM | POA: Diagnosis not present

## 2018-11-26 DIAGNOSIS — Z7984 Long term (current) use of oral hypoglycemic drugs: Secondary | ICD-10-CM | POA: Diagnosis not present

## 2018-11-26 DIAGNOSIS — T148XXA Other injury of unspecified body region, initial encounter: Secondary | ICD-10-CM

## 2018-11-26 HISTORY — DX: Type 2 diabetes mellitus without complications: E11.9

## 2018-11-26 MED ORDER — TETANUS-DIPHTH-ACELL PERTUSSIS 5-2.5-18.5 LF-MCG/0.5 IM SUSP
0.5000 mL | Freq: Once | INTRAMUSCULAR | Status: AC
  Administered 2018-11-26: 0.5 mL via INTRAMUSCULAR
  Filled 2018-11-26: qty 0.5

## 2018-11-26 MED ORDER — BACITRACIN ZINC 500 UNIT/GM EX OINT
TOPICAL_OINTMENT | Freq: Two times a day (BID) | CUTANEOUS | Status: DC
  Administered 2018-11-26: 1 via TOPICAL
  Filled 2018-11-26: qty 1.8

## 2018-11-26 NOTE — ED Notes (Signed)
PTAR contacted for transport. Paperwork printed and at nursing station. Will continue to monitor patient.  

## 2018-11-26 NOTE — ED Provider Notes (Signed)
[facility]-EMERGENCY DEPT Provider Note   CSN: [id number] Arrival date & time: 11/26/18  0443     History   Chief Complaint Chief Complaint  Patient presents with  . Fall    HPI Tyrone Cunningham is a 63 y.o. male.     The history is provided by the EMS personnel and medical records.  Fall This is a new problem. The current episode started less than 1 hour ago. The problem occurs constantly. The problem has not changed since onset.Pertinent negatives include no chest pain, no abdominal pain, no headaches and no shortness of breath. Nothing aggravates the symptoms. Nothing relieves the symptoms. He has tried nothing for the symptoms. The treatment provided no relief.  Tracheostomy dependent patient who slipped on urine at facility and fell and endorses knee pain and has abrasion to the right lateral forearm and forehead.   No LOC.  No emesis.    Past Medical History:  Diagnosis Date  . Bronchitis   . CHF (congestive heart failure) (HCC)    grade 1 diastolic with preserved EF in [id number]  . COPD (chronic obstructive pulmonary disease) (HCC)   . Diabetes mellitus without complication (HCC)   . DM type 2 with diabetic peripheral neuropathy (HCC)   . Hearing loss   . Hypercholesteremia   . Hypertension   . Peripheral neuropathy   . Sleep apnea    Severe sleep apnea requiring tracheostomy    Patient Active Problem List   Diagnosis Date Noted  . HCAP (healthcare-associated pneumonia) 10/09/2017  . Hypokalemia 10/09/2017  . Abnormal liver function 10/09/2017  . Severe protein-calorie malnutrition (HCC) 10/09/2017  . Cellulitis and abscess of left leg 08/04/2017  . Diabetic foot ulcer (HCC) 08/04/2017  . Acute renal failure (ARF) (HCC) 08/04/2017  . SOB (shortness of breath) 09/13/2016  . Peripheral vascular disease (HCC) 08/19/2016  . Tracheostomy care (HCC) 06/28/2015  . Obstructive sleep apnea 06/28/2015  . Chronic pain syndrome 04/18/2015  . Constipation  01/07/2014  . Constipation, chronic 01/04/2014  . Chronic respiratory failure (HCC) 01/06/2013  . Edema 03/16/2012  . Obesity 10/20/2011  . 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 [name] with frequent exacerbations   . CHF (congestive heart failure) (HCC)   . Hypertension   . Hypercholesteremia   . Hearing loss   . Diabetic neuropathy [facility](HCC)     Past Surgical History:  Procedure Laterality Date  . APPENDECTOMY    . CHOLECYSTECTOMY N/A 01/05/2014   Procedure: LAPAROSCOPIC CHOLECYSTECTOMY;  Surgeon: Emelia LoronMatthew Wakefield, MD;  Location: [facility] ORS;  Service: General;  Laterality: N/A;  . FEMUR HARDWARE REMOVAL  09/01/2003   Removal of retained hardware, two interlocking distal femoral  . FEMUR SURGERY    . HAND SURGERY    . MULTIPLE EXTRACTIONS WITH ALVEOLOPLASTY N/A 05/11/2012   Procedure: MULTIPLE EXTRACTION WITH ALVEOLOPLASTY;  Surgeon: Georgia LopesScott M Jensen, DDS;  Location: [facility] OR;  Service: Oral Surgery;  Laterality: N/A;  . TRACHEOSTOMY          Home Medications    Prior to Admission medications   Medication Sig Start Date End Date Taking? Authorizing Provider  albuterol (PROVENTIL HFA;VENTOLIN HFA) 108 (90 Base) MCG/ACT inhaler Inhale 2 puffs into the lungs every 6 (six) hours as needed for wheezing or shortness of breath. 09/10/17   Weber, Dema SeverinSarah L, PA-C  albuterol (PROVENTIL) (2.5 MG/3ML) 0.083% nebulizer solution Take 3 mLs (2.5 mg total) by nebulization  every 6 (six) hours as needed for wheezing or shortness of breath. 11/03/17   Weber, Damaris Hippo, PA-C  atorvastatin (LIPITOR) 20 MG tablet Take 1 tablet (20 mg total) by mouth daily. 09/09/17   Weber, [name], PA-C  BELBUCA 75 MCG FILM Place 1 Film inside cheek 2 (two) times daily as needed (pain).  10/05/17   [provider]  furosemide (LASIX) 20 MG tablet TAKE 1 TABLET BY MOUTH DAILY 11/24/17   Rutherford Guys, MD  glipiZIDE (GLUCOTROL) 5 MG  tablet Take 1 tablet (5 mg total) by mouth 2 (two) times daily before a meal. 09/09/17   Weber, Damaris Hippo, PA-C  hydrOXYzine (ATARAX/VISTARIL) 25 MG tablet Take 1 tablet (25 mg total) by mouth every 6 (six) hours as needed for anxiety. 10/15/17   Doreatha Lew, MD  metoprolol tartrate (LOPRESSOR) 50 MG tablet Take 1.5 tablets (75 mg total) by mouth 2 (two) times daily. 09/15/17   Weber, Damaris Hippo, PA-C  oxyCODONE-acetaminophen (PERCOCET) 10-325 MG tablet Take 1 tablet by mouth every 6 (six) hours as needed for pain.     [provider]  OXYGEN Inhale 10 L into the lungs continuous.    [provider]  polyethylene glycol powder (GLYCOLAX/MIRALAX) powder Take 17 g by mouth daily. 09/09/17   Weber, Damaris Hippo, PA-C  traMADol (ULTRAM) 50 MG tablet Take 50-100 mg by mouth 3 (three) times daily as needed for moderate pain.    [provider]  umeclidinium bromide (INCRUSE ELLIPTA) 62.5 MCG/INH AEPB Inhale 1 puff into the lungs daily. 09/09/17   Gale Journey, Damaris Hippo, PA-C    Family History Family History  Problem Relation Age of Onset  . Coronary artery disease Father     Social History Social History   Tobacco Use  . Smoking status: Former Smoker    Packs/day: 2.00    Years: 30.00    Pack years: 60.00    Types: Cigarettes    Quit date: 07/02/1999    Years since quitting: 19.4  . Smokeless tobacco: Never Used  Substance Use Topics  . Alcohol use: No    Comment: heavy drinker, quit in 1983  . Drug use: No    Comment: used "everything but heroin, I had my days"     Allergies   Patient has no known allergies.   Review of Systems Review of Systems  Constitutional: Negative for fever.  HENT: Negative for congestion.   Eyes: Negative for visual disturbance.  Respiratory: Negative for shortness of breath.   Cardiovascular: Negative for chest pain.  Gastrointestinal: Negative for abdominal pain.  Genitourinary: Negative for difficulty urinating.  Musculoskeletal:  Positive for arthralgias. Negative for back pain, neck pain and neck stiffness.  Neurological: Negative for weakness and headaches.  Psychiatric/Behavioral: Negative for agitation.  All other systems reviewed and are negative.    Physical Exam Updated Vital Signs Pulse 81   Temp 98.9 F (37.2 C) (Oral)   Resp 16   Ht 5' 7.5" (1.715 m)   Wt 124.7 kg   SpO2 99%   BMI 42.44 kg/m   Physical Exam Vitals signs and nursing note reviewed.  Constitutional:      General: He is not in acute distress.    Appearance: He is normal weight.  HENT:     Head: Normocephalic and atraumatic. No raccoon eyes or Battle's sign.     Nose: Nose normal.  Eyes:     Conjunctiva/sclera: Conjunctivae normal.     Pupils: Pupils are  equal, round, and reactive to light.  Neck:     Musculoskeletal: Normal range of motion and neck supple.  Cardiovascular:     Rate and Rhythm: Normal rate and regular rhythm.     Pulses: Normal pulses.     Heart sounds: Normal heart sounds.  Pulmonary:     Effort: Pulmonary effort is normal.     Breath sounds: Normal breath sounds.  Abdominal:     General: Abdomen is flat. Bowel sounds are normal.     Tenderness: There is no abdominal tenderness. There is no guarding.  Musculoskeletal: Normal range of motion.        General: No tenderness.     Right wrist: Normal.     Left wrist: Normal.     Right knee: Normal.     Left knee: Normal.     Right ankle: Normal. Achilles tendon normal.     Left ankle: Normal. Achilles tendon normal.     Cervical back: Normal.     Thoracic back: Normal.     Lumbar back: Normal.       Arms:  Skin:    General: Skin is warm and dry.     Capillary Refill: Capillary refill takes less than 2 seconds.  Neurological:     General: No focal deficit present.     Mental Status: He is alert.  Psychiatric:        Mood and Affect: Mood normal.      ED Treatments / Results  Labs (all labs ordered are listed, but only abnormal results are  displayed) Labs Reviewed - No data to display  EKG None  Radiology Results for orders placed or performed during the hospital encounter of 10/19/17  CBC with Differential  Result Value Ref Range   WBC 11.8 (H) 4.0 - 10.5 K/uL   RBC 4.01 (L) 4.22 - 5.81 MIL/uL   Hemoglobin 11.4 (L) 13.0 - 17.0 g/dL   HCT 78.4 (L) 69.6 - 29.5 %   MCV 88.0 78.0 - 100.0 fL   MCH 28.4 26.0 - 34.0 pg   MCHC 32.3 30.0 - 36.0 g/dL   RDW 28.4 13.2 - 44.0 %   Platelets 338 150 - 400 K/uL   Neutrophils Relative % 84 %   Neutro Abs 9.9 (H) 1.7 - 7.7 K/uL   Lymphocytes Relative 12 %   Lymphs Abs 1.4 0.7 - 4.0 K/uL   Monocytes Relative 4 %   Monocytes Absolute 0.4 0.1 - 1.0 K/uL   Eosinophils Relative 0 %   Eosinophils Absolute 0.0 0.0 - 0.7 K/uL   Basophils Relative 0 %   Basophils Absolute 0.0 0.0 - 0.1 K/uL  Comprehensive metabolic panel  Result Value Ref Range   Sodium 135 135 - 145 mmol/L   Potassium 4.4 3.5 - 5.1 mmol/L   Chloride 97 (L) 98 - 111 mmol/L   CO2 28 22 - 32 mmol/L   Glucose, Bld 260 (H) 70 - 99 mg/dL   BUN 18 8 - 23 mg/dL   Creatinine, Ser 1.02 0.61 - 1.24 mg/dL   Calcium 9.1 8.9 - 72.5 mg/dL   Total Protein 7.3 6.5 - 8.1 g/dL   Albumin 2.5 (L) 3.5 - 5.0 g/dL   AST 24 15 - 41 U/L   ALT 35 0 - 44 U/L   Alkaline Phosphatase 117 38 - 126 U/L   Total Bilirubin 0.4 0.3 - 1.2 mg/dL   GFR calc non Af Amer >60 >60 mL/min   GFR calc  Af Amer >60 >60 mL/min   Anion gap 10 5 - 15  Lipase, blood  Result Value Ref Range   Lipase 41 11 - 51 U/L  Urinalysis, Routine w reflex microscopic  Result Value Ref Range   Color, Urine YELLOW YELLOW   APPearance CLEAR CLEAR   Specific Gravity, Urine 1.016 1.005 - 1.030   pH 7.0 5.0 - 8.0   Glucose, UA >=500 (A) NEGATIVE mg/dL   Hgb urine dipstick NEGATIVE NEGATIVE   Bilirubin Urine NEGATIVE NEGATIVE   Ketones, ur NEGATIVE NEGATIVE mg/dL   Protein, ur >=161 (A) NEGATIVE mg/dL   Nitrite NEGATIVE NEGATIVE   Leukocytes, UA NEGATIVE NEGATIVE    RBC / HPF 0-5 0 - 5 RBC/hpf   WBC, UA 0-5 0 - 5 WBC/hpf   Bacteria, UA NONE SEEN NONE SEEN   Mucus PRESENT   Brain natriuretic peptide  Result Value Ref Range   B Natriuretic Peptide 98.3 0.0 - 100.0 pg/mL  Ethanol  Result Value Ref Range   Alcohol, Ethyl (B) <10 <10 mg/dL  Rapid urine drug screen (hospital performed)  Result Value Ref Range   Opiates (A) NONE DETECTED    Result not available. Reagent lot number recalled by manufacturer.   Cocaine NONE DETECTED NONE DETECTED   Benzodiazepines NONE DETECTED NONE DETECTED   Amphetamines NONE DETECTED NONE DETECTED   Tetrahydrocannabinol NONE DETECTED NONE DETECTED   Barbiturates NONE DETECTED NONE DETECTED  I-Stat CG4 Lactic Acid, ED  Result Value Ref Range   Lactic Acid, Venous 1.68 0.5 - 1.9 mmol/L   Dg Knee Complete 4 Views Left  Result Date: 11/26/2018 CLINICAL DATA:  Fall with knee pain on the left.  Initial encounter. EXAM: LEFT KNEE - COMPLETE 4+ VIEW COMPARISON:  03/27/2012 FINDINGS: No acute fracture, dislocation, or joint effusion. Protuberance from the inferior pole patella attributed to remote avulsion injury. Arterial calcification. Femoral nail with bulky callus at the level of a healed femur fracture. IMPRESSION: No acute finding or change from 2014. Electronically Signed   By: Marnee Spring M.D.   On: 11/26/2018 06:10   Dg Hip Unilat With Pelvis 2-3 Views Left  Result Date: 11/26/2018 CLINICAL DATA:  Fall with left hip pain EXAM: DG HIP (WITH OR WITHOUT PELVIS) 2-3V LEFT COMPARISON:  11/11/2012 FINDINGS: Left femoral nail field intertrochanteric or subtrochanteric femur fracture. No acute fracture or dislocation. No evidence of pelvic ring diastasis. IMPRESSION: No acute finding. Electronically Signed   By: Marnee Spring M.D.   On: 11/26/2018 06:11    Procedures Procedures (including critical care time)  Medications Ordered in ED Medications  Tdap (BOOSTRIX) injection 0.5 mL (has no administration in time range)   bacitracin ointment (has no administration in time range)     Patient had a fall.  No injuries on imaging.  Stable for discharge with close follow up.  Wound care for abrasion.  Tetanus updated.  Patient is on narcotics at the facility for chronic pain.     Anakin Varkey was evaluated in Emergency Department on 11/26/2018 for the symptoms described in the history of present illness. He was evaluated in the context of the global COVID-19 pandemic, which necessitated consideration that the patient might be at risk for infection with the SARS-CoV-2 virus that causes COVID-19. Institutional protocols and algorithms that pertain to the evaluation of patients at risk for COVID-19 are in a state of rapid change based on information released by regulatory bodies including the CDC and federal and state organizations. These policies  and algorithms were followed during the patient's care in the ED.   Final Clinical Impressions(s) / ED Diagnoses   Final diagnoses:  Fall    Return for intractable cough, coughing up blood,fevers >100.4 unrelieved by medication, shortness of breath, intractable vomiting, chest pain, shortness of breath, weakness,numbness, changes in speech, facial asymmetry,abdominal pain, passing out,Inability to tolerate liquids or food, cough, altered mental status or any concerns. No signs of systemic illness or infection. The patient is nontoxic-appearing on exam and vital signs are within normal limits.   I have reviewed the triage vital signs and the nursing notes. Pertinent labs &imaging results that were available during my care of the patient were reviewed by me and considered in my medical decision making (see chart for details).After history, exam, and medical workup I feel the patient has beenappropriately medically screened and is safe for discharge home. Pertinent diagnoses were discussed with the patient. Patient was given return precautions.    Juliann Olesky, MD  11/26/18 [phone]

## 2018-11-26 NOTE — ED Notes (Signed)
Two small abrasions noted to left forearm. Forearm abrasions cleansed and bacitracin and dressing applied.

## 2018-11-26 NOTE — ED Notes (Signed)
Patient transported to radiology

## 2018-11-26 NOTE — ED Notes (Signed)
Respiratory paged because patient is requesting trach brush. Patient provided trach brush to perform trach care independently. Patient in NAD at this time, but states he feels like he has increased secretions that need to be cleaned out and insists on doing so himself. Will continue to monitor patient.

## 2018-11-26 NOTE — ED Triage Notes (Signed)
Patient coming from [facility] with complaints of a fall around 0315 where he slipped in his urine and fell to the ground. PTAR was called for a lift assist because facility was unable to get patient out of floor. Abrasions noted to left forearm and left knee. Patient states that he hit the left side of his face and is complaining of facial pain. No loss of consciousness and not on any blood thinners.   Patient is ambulatory at baseline with no assistive devices. Patient has a trach but no complaints with this currently. Patient is hard of hearing.

## 2019-02-07 ENCOUNTER — Emergency Department (HOSPITAL_COMMUNITY)
Admission: EM | Admit: 2019-02-07 | Discharge: 2019-02-08 | Disposition: A | Discharge status: Home / Self Care | Payer: Medicare Other | Attending: Emergency Medicine | Admitting: Emergency Medicine

## 2019-02-07 DIAGNOSIS — J449 Chronic obstructive pulmonary disease, unspecified: Secondary | ICD-10-CM | POA: Diagnosis not present

## 2019-02-07 DIAGNOSIS — I110 Hypertensive heart disease with heart failure: Secondary | ICD-10-CM | POA: Insufficient documentation

## 2019-02-07 DIAGNOSIS — Z87891 Personal history of nicotine dependence: Secondary | ICD-10-CM | POA: Diagnosis not present

## 2019-02-07 DIAGNOSIS — I5032 Chronic diastolic (congestive) heart failure: Secondary | ICD-10-CM | POA: Insufficient documentation

## 2019-02-07 DIAGNOSIS — E119 Type 2 diabetes mellitus without complications: Secondary | ICD-10-CM | POA: Insufficient documentation

## 2019-02-07 DIAGNOSIS — Z7984 Long term (current) use of oral hypoglycemic drugs: Secondary | ICD-10-CM | POA: Insufficient documentation

## 2019-02-07 DIAGNOSIS — Z430 Encounter for attention to tracheostomy: Secondary | ICD-10-CM | POA: Diagnosis present

## 2019-02-07 DIAGNOSIS — Z79899 Other long term (current) drug therapy: Secondary | ICD-10-CM | POA: Diagnosis not present

## 2019-02-07 NOTE — ED Triage Notes (Signed)
Pt came in [facility] c/o of headache that is pressure behind the eyes. 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  198/102, 70HR, 20RR, 204CBG

## 2019-02-08 DIAGNOSIS — Z430 Encounter for attention to tracheostomy: Secondary | ICD-10-CM | POA: Diagnosis not present

## 2019-02-08 NOTE — ED Provider Notes (Signed)
[facility] EMERGENCY DEPARTMENT Provider Note   CSN: [id number] Arrival date & time: 02/07/19  2344     History   Chief Complaint Chief Complaint  Patient presents with  . Headache    HPI Tyrone Cunningham is a 63 y.o. male.     HPI  Patient is a 63 year old male with a history of heart failure, COPD, diabetes, hypertension, sleep apnea requiring tracheostomy who presents with dislodged inner cannula.  Per EMS, patient was noted to have a tracheostomy dislodged but is stable on blow-by oxygen.  They report that he was complaining of headache.  My evaluation the patient he is awake, alert, and oriented.  He is in no acute distress.  He has no complaints at this time.  He does report that he has had an intermittent headache over the last several days.  Denies having headache right now.  Denies any weakness, numbness, tingling of the lower extremities, speech difficulty, vision changes.  He is unsure when his cannula fell out.  Denies headache, chest pain, shortness of breath.  Spoke with the nurse from [facility].  She reports that the patient was at his baseline but was noted to have a dislodged inner cannula.  He was not noted to have shortness of breath.  Reports that otherwise the patient was at his baseline.  They called the doctor on-call who requested he be transported to the emergency room.  I reviewed the patient's chart.  It appears that he frequently does not like to wear his inner cannula.  He cannot tell me when the inner cannula dislodged. Past Medical History:  Diagnosis Date  . Bronchitis   . CHF (congestive heart failure) (HCC)    grade 1 diastolic with preserved EF in 4098  . COPD (chronic obstructive pulmonary disease) (HCC)   . Diabetes mellitus without complication (HCC)   . DM type 2 with diabetic peripheral neuropathy (HCC)   . Hearing loss   . Hypercholesteremia   . Hypertension   . Peripheral neuropathy   . Sleep apnea    Severe sleep apnea  requiring tracheostomy    Patient Active Problem List   Diagnosis Date Noted  . HCAP (healthcare-associated pneumonia) 10/09/2017  . Hypokalemia 10/09/2017  . Abnormal liver function 10/09/2017  . Severe protein-calorie malnutrition (HCC) 10/09/2017  . Cellulitis and abscess of left leg 08/04/2017  . Diabetic foot ulcer (HCC) 08/04/2017  . Acute renal failure (ARF) (HCC) 08/04/2017  . SOB (shortness of breath) 09/13/2016  . Peripheral vascular disease (HCC) 08/19/2016  . Tracheostomy care (HCC) 06/28/2015  . Obstructive sleep apnea 06/28/2015  . Chronic pain syndrome 04/18/2015  . Constipation 01/07/2014  . Constipation, chronic 01/04/2014  . Chronic respiratory failure (HCC) 01/06/2013  . Edema 03/16/2012  . Obesity 10/20/2011  . 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 [name] with frequent exacerbations   . CHF (congestive heart failure) (HCC)   . Hypertension   . Hypercholesteremia   . Hearing loss   . Diabetic neuropathy [facility])     Past Surgical History:  Procedure Laterality Date  . APPENDECTOMY    . CHOLECYSTECTOMY N/A 01/05/2014   Procedure: LAPAROSCOPIC CHOLECYSTECTOMY;  Surgeon: Emelia Loron, MD;  Location: [facility];  Service: General;  Laterality: N/A;  . FEMUR HARDWARE REMOVAL  09/01/2003   Removal of retained hardware, two interlocking distal femoral  . FEMUR SURGERY    . HAND SURGERY    .  MULTIPLE EXTRACTIONS WITH ALVEOLOPLASTY N/A 05/11/2012   Procedure: MULTIPLE EXTRACTION WITH ALVEOLOPLASTY;  Surgeon: Gae Bon, DDS;  Location: [facility];  Service: Oral Surgery;  Laterality: N/A;  . TRACHEOSTOMY          Home Medications    Prior to Admission medications   Medication Sig Start Date End Date Taking? Authorizing Provider  albuterol (PROVENTIL HFA;VENTOLIN HFA) 108 (90 Base) MCG/ACT inhaler Inhale 2 puffs into the lungs every 6 (six) hours as needed for wheezing  or shortness of breath. 09/10/17   Weber, Damaris Hippo, PA-C  albuterol (PROVENTIL) (2.5 MG/3ML) 0.083% nebulizer solution Take 3 mLs (2.5 mg total) by nebulization every 6 (six) hours as needed for wheezing or shortness of breath. 11/03/17   Weber, Damaris Hippo, PA-C  atorvastatin (LIPITOR) 20 MG tablet Take 1 tablet (20 mg total) by mouth daily. 09/09/17   Weber, [name], PA-C  BELBUCA 75 MCG FILM Place 1 Film inside cheek 2 (two) times daily as needed (pain).  10/05/17   [provider]  furosemide (LASIX) 20 MG tablet TAKE 1 TABLET BY MOUTH DAILY 11/24/17   Rutherford Guys, MD  glipiZIDE (GLUCOTROL) 5 MG tablet Take 1 tablet (5 mg total) by mouth 2 (two) times daily before a meal. 09/09/17   Weber, Damaris Hippo, PA-C  hydrOXYzine (ATARAX/VISTARIL) 25 MG tablet Take 1 tablet (25 mg total) by mouth every 6 (six) hours as needed for anxiety. 10/15/17   Doreatha Lew, MD  metoprolol tartrate (LOPRESSOR) 50 MG tablet Take 1.5 tablets (75 mg total) by mouth 2 (two) times daily. 09/15/17   Weber, Damaris Hippo, PA-C  oxyCODONE-acetaminophen (PERCOCET) 10-325 MG tablet Take 1 tablet by mouth every 6 (six) hours as needed for pain.     [provider]  OXYGEN Inhale 10 L into the lungs continuous.    [provider]  polyethylene glycol powder (GLYCOLAX/MIRALAX) powder Take 17 g by mouth daily. 09/09/17   Weber, Damaris Hippo, PA-C  traMADol (ULTRAM) 50 MG tablet Take 50-100 mg by mouth 3 (three) times daily as needed for moderate pain.    [provider]  umeclidinium bromide (INCRUSE ELLIPTA) 62.5 MCG/INH AEPB Inhale 1 puff into the lungs daily. 09/09/17   Gale Journey, Damaris Hippo, PA-C    Family History Family History  Problem Relation Age of Onset  . Coronary artery disease Father     Social History Social History   Tobacco Use  . Smoking status: Former Smoker    Packs/day: 2.00    Years: 30.00    Pack years: 60.00    Types: Cigarettes    Quit date: 07/02/1999    Years since quitting: 19.6  .  Smokeless tobacco: Never Used  Substance Use Topics  . Alcohol use: No    Comment: heavy drinker, quit in 1983  . Drug use: No    Comment: used "everything but heroin, I had my days"     Allergies   Patient has no known allergies.   Review of Systems Review of Systems  Constitutional: Negative for fever.  Respiratory: Negative for shortness of breath.        Dislodged trach  Cardiovascular: Negative for chest pain.  Gastrointestinal: Negative for abdominal pain.  Genitourinary: Negative for dysuria.  Neurological: Negative for speech difficulty, weakness, numbness and headaches.  All other systems reviewed and are negative.    Physical Exam Updated Vital Signs BP (!) 147/68 (BP Location: Right Arm)   Pulse 71   Temp  98.1 F (36.7 C)   Resp 18   Ht 1.702 m (5\' 7" )   Wt 122.5 kg   SpO2 100%   BMI 42.29 kg/m   Physical Exam Vitals signs and nursing note reviewed.  Constitutional:      Appearance: He is obese.     Comments: Chronically ill-appearing  HENT:     Head: Normocephalic and atraumatic.     Mouth/Throat:     Mouth: Mucous membranes are moist.  Eyes:     Pupils: Pupils are equal, round, and reactive to light.  Neck:     Musculoskeletal: Neck supple.     Comments: Metal trach, outer cannula in place Cardiovascular:     Rate and Rhythm: Normal rate and regular rhythm.     Heart sounds: Normal heart sounds. No murmur.  Pulmonary:     Effort: Pulmonary effort is normal. No respiratory distress.     Breath sounds: Normal breath sounds. No wheezing.  Abdominal:     General: Bowel sounds are normal.     Palpations: Abdomen is soft.     Tenderness: There is no abdominal tenderness. There is no rebound.  Skin:    General: Skin is warm and dry.  Neurological:     Mental Status: He is alert and oriented to person, place, and time.     Comments: Cranial nerves II through XII intact, 5 out of 5 strength in all 4 extremities, fluent speech  Psychiatric:         Mood and Affect: Mood normal.      ED Treatments / Results  Labs (all labs ordered are listed, but only abnormal results are displayed) Labs Reviewed - No data to display  EKG EKG Interpretation  Date/Time:  Monday February 07 2019 23:51:32 EST Ventricular Rate:  70 PR Interval:    QRS Duration: 100 QT Interval:  385 QTC Calculation: 416 R Axis:   -31 Text Interpretation: Sinus rhythm Incomplete RBBB and LAFB Abnormal R-wave progression, early transition No significant change since last tracing Confirmed by [id number] (Ross Marcus) on 02/08/2019 12:17:48 AM   Radiology No results found.  Procedures Procedures (including critical care time)  Medications Ordered in ED Medications - No data to display   Initial Impression / Assessment and Plan / ED Course  I have reviewed the triage vital signs and the nursing notes.  Pertinent labs & imaging results that were available during my care of the patient were reviewed by me and considered in my medical decision making (see chart for details).  Clinical Course as of Feb 07 126  Tue Feb 08, 2019  0006 Spoke with [facility] RN.  Inner cannula missing. ?when?  No other complaints.     [CH]    Clinical Course User Index [CH] , [facility], MD       Patient presents with dislodged inner cannula.  Unknown timeframe.  He is currently wearing a metal trach as his primary indication is sleep apnea.  We do not have the metal replacement.  Mayer Masker was exchanged for a 6-0 Shiley.  On reassessment, patient continues to be in no acute distress.  At this time I do not feel he needs further work-up and will be discharged back to his facility.  After history, exam, and medical workup I feel the patient has been appropriately medically screened and is safe for discharge home. Pertinent diagnoses were discussed with the patient. Patient was given return precautions.   Final Clinical Impressions(s) / ED Diagnoses  Final diagnoses:   Encounter for attention to tracheostomy [facility](HCC)    ED Discharge Orders    None       , Mayer Maskerourtney F, MD 02/08/19 [id number]

## 2019-02-08 NOTE — ED Notes (Signed)
Report given to pt nurse at [facility].

## 2019-02-08 NOTE — Progress Notes (Signed)
Patient admitted to ED with Tyrone Cunningham trach with missing inner cannula. Per MD order patient changed to 6 uncuffed shiley trach. ETCO2 detector used to confirm placement. Vitals stable and he can vocalize freely patient tolerated procedure. Will continue to monitor.

## 2019-02-08 NOTE — Discharge Instructions (Addendum)
Tracheostomy was replaced.  Continue trach care at your facility.

## 2019-02-08 NOTE — ED Notes (Signed)
Ptar called for pt 

## 2019-02-19 ENCOUNTER — Emergency Department (HOSPITAL_COMMUNITY): Payer: Medicare Other

## 2019-02-19 ENCOUNTER — Inpatient Hospital Stay (HOSPITAL_COMMUNITY)
Admission: EM | Admit: 2019-02-19 | Discharge: 2019-03-13 | DRG: 064 | Disposition: A | Discharge status: Hospice | Payer: Medicare Other | Source: Skilled Nursing Facility | Attending: Internal Medicine | Admitting: Internal Medicine

## 2019-02-19 ENCOUNTER — Other Ambulatory Visit: Payer: Self-pay

## 2019-02-19 DIAGNOSIS — Z4659 Encounter for fitting and adjustment of other gastrointestinal appliance and device: Secondary | ICD-10-CM

## 2019-02-19 DIAGNOSIS — G9341 Metabolic encephalopathy: Secondary | ICD-10-CM | POA: Diagnosis not present

## 2019-02-19 DIAGNOSIS — R29712 NIHSS score 12: Secondary | ICD-10-CM | POA: Diagnosis present

## 2019-02-19 DIAGNOSIS — I5042 Chronic combined systolic (congestive) and diastolic (congestive) heart failure: Secondary | ICD-10-CM | POA: Diagnosis present

## 2019-02-19 DIAGNOSIS — A419 Sepsis, unspecified organism: Secondary | ICD-10-CM | POA: Diagnosis not present

## 2019-02-19 DIAGNOSIS — T17908A Unspecified foreign body in respiratory tract, part unspecified causing other injury, initial encounter: Secondary | ICD-10-CM

## 2019-02-19 DIAGNOSIS — R109 Unspecified abdominal pain: Secondary | ICD-10-CM | POA: Diagnosis not present

## 2019-02-19 DIAGNOSIS — E1149 Type 2 diabetes mellitus with other diabetic neurological complication: Secondary | ICD-10-CM | POA: Diagnosis present

## 2019-02-19 DIAGNOSIS — Z79899 Other long term (current) drug therapy: Secondary | ICD-10-CM

## 2019-02-19 DIAGNOSIS — H47611 Cortical blindness, right side of brain: Secondary | ICD-10-CM | POA: Diagnosis not present

## 2019-02-19 DIAGNOSIS — R0600 Dyspnea, unspecified: Secondary | ICD-10-CM

## 2019-02-19 DIAGNOSIS — Z66 Do not resuscitate: Secondary | ICD-10-CM | POA: Diagnosis not present

## 2019-02-19 DIAGNOSIS — E1142 Type 2 diabetes mellitus with diabetic polyneuropathy: Secondary | ICD-10-CM | POA: Diagnosis present

## 2019-02-19 DIAGNOSIS — Z515 Encounter for palliative care: Secondary | ICD-10-CM

## 2019-02-19 DIAGNOSIS — Z930 Tracheostomy status: Secondary | ICD-10-CM | POA: Diagnosis not present

## 2019-02-19 DIAGNOSIS — I63533 Cerebral infarction due to unspecified occlusion or stenosis of bilateral posterior cerebral arteries: Principal | ICD-10-CM | POA: Diagnosis present

## 2019-02-19 DIAGNOSIS — R0602 Shortness of breath: Secondary | ICD-10-CM | POA: Diagnosis present

## 2019-02-19 DIAGNOSIS — Z20822 Contact with and (suspected) exposure to covid-19: Secondary | ICD-10-CM | POA: Diagnosis present

## 2019-02-19 DIAGNOSIS — Z9049 Acquired absence of other specified parts of digestive tract: Secondary | ICD-10-CM | POA: Diagnosis not present

## 2019-02-19 DIAGNOSIS — J90 Pleural effusion, not elsewhere classified: Secondary | ICD-10-CM

## 2019-02-19 DIAGNOSIS — I5032 Chronic diastolic (congestive) heart failure: Secondary | ICD-10-CM

## 2019-02-19 DIAGNOSIS — T17908D Unspecified foreign body in respiratory tract, part unspecified causing other injury, subsequent encounter: Secondary | ICD-10-CM | POA: Diagnosis not present

## 2019-02-19 DIAGNOSIS — J9620 Acute and chronic respiratory failure, unspecified whether with hypoxia or hypercapnia: Secondary | ICD-10-CM

## 2019-02-19 DIAGNOSIS — G894 Chronic pain syndrome: Secondary | ICD-10-CM | POA: Diagnosis present

## 2019-02-19 DIAGNOSIS — E870 Hyperosmolality and hypernatremia: Secondary | ICD-10-CM | POA: Diagnosis not present

## 2019-02-19 DIAGNOSIS — G8191 Hemiplegia, unspecified affecting right dominant side: Secondary | ICD-10-CM

## 2019-02-19 DIAGNOSIS — I639 Cerebral infarction, unspecified: Secondary | ICD-10-CM

## 2019-02-19 DIAGNOSIS — Z8249 Family history of ischemic heart disease and other diseases of the circulatory system: Secondary | ICD-10-CM

## 2019-02-19 DIAGNOSIS — Z6841 Body Mass Index (BMI) 40.0 and over, adult: Secondary | ICD-10-CM

## 2019-02-19 DIAGNOSIS — I6389 Other cerebral infarction: Secondary | ICD-10-CM | POA: Diagnosis not present

## 2019-02-19 DIAGNOSIS — I10 Essential (primary) hypertension: Secondary | ICD-10-CM | POA: Diagnosis present

## 2019-02-19 DIAGNOSIS — E1165 Type 2 diabetes mellitus with hyperglycemia: Secondary | ICD-10-CM | POA: Diagnosis present

## 2019-02-19 DIAGNOSIS — I110 Hypertensive heart disease with heart failure: Secondary | ICD-10-CM | POA: Diagnosis present

## 2019-02-19 DIAGNOSIS — J9621 Acute and chronic respiratory failure with hypoxia: Secondary | ICD-10-CM | POA: Diagnosis present

## 2019-02-19 DIAGNOSIS — I672 Cerebral atherosclerosis: Secondary | ICD-10-CM | POA: Diagnosis present

## 2019-02-19 DIAGNOSIS — R29810 Facial weakness: Secondary | ICD-10-CM | POA: Diagnosis present

## 2019-02-19 DIAGNOSIS — R140 Abdominal distension (gaseous): Secondary | ICD-10-CM

## 2019-02-19 DIAGNOSIS — Z5329 Procedure and treatment not carried out because of patient's decision for other reasons: Secondary | ICD-10-CM | POA: Diagnosis not present

## 2019-02-19 DIAGNOSIS — Z7401 Bed confinement status: Secondary | ICD-10-CM

## 2019-02-19 DIAGNOSIS — H9190 Unspecified hearing loss, unspecified ear: Secondary | ICD-10-CM | POA: Diagnosis present

## 2019-02-19 DIAGNOSIS — R911 Solitary pulmonary nodule: Secondary | ICD-10-CM | POA: Diagnosis present

## 2019-02-19 DIAGNOSIS — J9622 Acute and chronic respiratory failure with hypercapnia: Secondary | ICD-10-CM | POA: Diagnosis present

## 2019-02-19 DIAGNOSIS — J690 Pneumonitis due to inhalation of food and vomit: Secondary | ICD-10-CM | POA: Diagnosis present

## 2019-02-19 DIAGNOSIS — E785 Hyperlipidemia, unspecified: Secondary | ICD-10-CM | POA: Diagnosis present

## 2019-02-19 DIAGNOSIS — R0989 Other specified symptoms and signs involving the circulatory and respiratory systems: Secondary | ICD-10-CM

## 2019-02-19 DIAGNOSIS — Z794 Long term (current) use of insulin: Secondary | ICD-10-CM

## 2019-02-19 DIAGNOSIS — J449 Chronic obstructive pulmonary disease, unspecified: Secondary | ICD-10-CM | POA: Diagnosis present

## 2019-02-19 DIAGNOSIS — R1310 Dysphagia, unspecified: Secondary | ICD-10-CM | POA: Diagnosis present

## 2019-02-19 DIAGNOSIS — Z87891 Personal history of nicotine dependence: Secondary | ICD-10-CM

## 2019-02-19 DIAGNOSIS — Z7951 Long term (current) use of inhaled steroids: Secondary | ICD-10-CM

## 2019-02-19 DIAGNOSIS — H47612 Cortical blindness, left side of brain: Secondary | ICD-10-CM | POA: Diagnosis not present

## 2019-02-19 DIAGNOSIS — G4733 Obstructive sleep apnea (adult) (pediatric): Secondary | ICD-10-CM | POA: Diagnosis present

## 2019-02-19 DIAGNOSIS — E860 Dehydration: Secondary | ICD-10-CM | POA: Diagnosis not present

## 2019-02-19 DIAGNOSIS — E7800 Pure hypercholesterolemia, unspecified: Secondary | ICD-10-CM | POA: Diagnosis present

## 2019-02-19 DIAGNOSIS — Z0189 Encounter for other specified special examinations: Secondary | ICD-10-CM

## 2019-02-19 DIAGNOSIS — I441 Atrioventricular block, second degree: Secondary | ICD-10-CM | POA: Diagnosis not present

## 2019-02-19 LAB — COMPREHENSIVE METABOLIC PANEL
ALT: 17 U/L (ref 0–44)
AST: 14 U/L — ABNORMAL LOW (ref 15–41)
Albumin: 3 g/dL — ABNORMAL LOW (ref 3.5–5.0)
Alkaline Phosphatase: 82 U/L (ref 38–126)
Anion gap: 11 (ref 5–15)
BUN: 33 mg/dL — ABNORMAL HIGH (ref 8–23)
CO2: 27 mmol/L (ref 22–32)
Calcium: 9.3 mg/dL (ref 8.9–10.3)
Chloride: 99 mmol/L (ref 98–111)
Creatinine, Ser: 1.38 mg/dL — ABNORMAL HIGH (ref 0.61–1.24)
GFR calc Af Amer: >60 mL/min (ref >60)
GFR calc non Af Amer: 53 mL/min — ABNORMAL LOW (ref >60)
Glucose, Bld: 205 mg/dL — ABNORMAL HIGH (ref 70–99)
Potassium: 5.1 mmol/L (ref 3.5–5.1)
Sodium: 137 mmol/L (ref 135–145)
Total Bilirubin: 0.3 mg/dL (ref 0.3–1.2)
Total Protein: 7 g/dL (ref 6.5–8.1)

## 2019-02-19 LAB — URINALYSIS, ROUTINE W REFLEX MICROSCOPIC
Bacteria, UA: NONE SEEN
Bilirubin Urine: NEGATIVE
Glucose, UA: 50 mg/dL — AB
Ketones, ur: NEGATIVE mg/dL
Leukocytes,Ua: NEGATIVE
Nitrite: NEGATIVE
Protein, ur: 100 mg/dL — AB
Specific Gravity, Urine: 1.041 — ABNORMAL HIGH (ref 1.005–1.030)
pH: 5 (ref 5.0–8.0)

## 2019-02-19 LAB — APTT: aPTT: 28 seconds (ref 24–36)

## 2019-02-19 LAB — CBC
HCT: 44.8 % (ref 39.0–52.0)
Hemoglobin: 14.6 g/dL (ref 13.0–17.0)
MCH: 30 pg (ref 26.0–34.0)
MCHC: 32.6 g/dL (ref 30.0–36.0)
MCV: 92 fL (ref 80.0–100.0)
Platelets: 227 10*3/uL (ref 150–400)
RBC: 4.87 MIL/uL (ref 4.22–5.81)
RDW: 13.5 % (ref 11.5–15.5)
WBC: 15.7 10*3/uL — ABNORMAL HIGH (ref 4.0–10.5)
nRBC: 0 % (ref 0.0–0.2)

## 2019-02-19 LAB — DIFFERENTIAL
Abs Immature Granulocytes: 0.73 10*3/uL — ABNORMAL HIGH (ref 0.00–0.07)
Basophils Absolute: 0.1 10*3/uL (ref 0.0–0.1)
Basophils Relative: 1 %
Eosinophils Absolute: 0.1 10*3/uL (ref 0.0–0.5)
Eosinophils Relative: 0 %
Immature Granulocytes: 5 %
Lymphocytes Relative: 12 %
Lymphs Abs: 1.8 10*3/uL (ref 0.7–4.0)
Monocytes Absolute: 0.7 10*3/uL (ref 0.1–1.0)
Monocytes Relative: 5 %
Neutro Abs: 12.2 10*3/uL — ABNORMAL HIGH (ref 1.7–7.7)
Neutrophils Relative %: 77 %

## 2019-02-19 LAB — I-STAT CHEM 8, ED
BUN: 35 mg/dL — ABNORMAL HIGH (ref 8–23)
Calcium, Ion: 1.06 mmol/L — ABNORMAL LOW (ref 1.15–1.40)
Chloride: 102 mmol/L (ref 98–111)
Creatinine, Ser: 1.1 mg/dL (ref 0.61–1.24)
Glucose, Bld: 198 mg/dL — ABNORMAL HIGH (ref 70–99)
HCT: 44 % (ref 39.0–52.0)
Hemoglobin: 15 g/dL (ref 13.0–17.0)
Potassium: 4.9 mmol/L (ref 3.5–5.1)
Sodium: 136 mmol/L (ref 135–145)
TCO2: 27 mmol/L (ref 22–32)

## 2019-02-19 LAB — RAPID URINE DRUG SCREEN, HOSP PERFORMED
Amphetamines: NOT DETECTED
Barbiturates: NOT DETECTED
Benzodiazepines: NOT DETECTED
Cocaine: NOT DETECTED
Opiates: NOT DETECTED
Tetrahydrocannabinol: NOT DETECTED

## 2019-02-19 LAB — CBG MONITORING, ED: Glucose-Capillary: 166 mg/dL — ABNORMAL HIGH (ref 70–99)

## 2019-02-19 LAB — GLUCOSE, CAPILLARY: Glucose-Capillary: 99 mg/dL (ref 70–99)

## 2019-02-19 LAB — PROTIME-INR
INR: 1 (ref 0.8–1.2)
Prothrombin Time: 12.7 seconds (ref 11.4–15.2)

## 2019-02-19 LAB — SARS CORONAVIRUS 2 (TAT 6-24 HRS): SARS Coronavirus 2: NEGATIVE

## 2019-02-19 MED ORDER — FLUOXETINE HCL 20 MG PO CAPS: 20.0000 mg | ORAL_CAPSULE | Freq: Every day | ORAL | Status: DC

## 2019-02-19 MED ORDER — CLONAZEPAM 0.5 MG PO TABS: 0.5000 mg | ORAL_TABLET | Freq: Two times a day (BID) | ORAL | Status: DC

## 2019-02-19 MED ORDER — DOCUSATE SODIUM 100 MG PO CAPS: 100.0000 mg | ORAL_CAPSULE | Freq: Two times a day (BID) | ORAL | Status: DC

## 2019-02-19 MED ORDER — GABAPENTIN 100 MG PO CAPS: 200.0000 mg | ORAL_CAPSULE | Freq: Two times a day (BID) | ORAL | Status: DC

## 2019-02-19 MED ORDER — LISINOPRIL 10 MG PO TABS: 10.0000 mg | ORAL_TABLET | Freq: Every day | ORAL | Status: DC

## 2019-02-19 MED ORDER — IPRATROPIUM-ALBUTEROL 0.5-2.5 (3) MG/3ML IN SOLN
3.0000 mL | RESPIRATORY_TRACT | Status: DC
  Administered 2019-02-19 – 2019-02-20 (×7): 3 mL via RESPIRATORY_TRACT
  Filled 2019-02-19 (×7): qty 3

## 2019-02-19 MED ORDER — INSULIN ASPART 100 UNIT/ML ~~LOC~~ SOLN
0.0000 [IU] | Freq: Three times a day (TID) | SUBCUTANEOUS | Status: DC
  Administered 2019-02-20 – 2019-03-01 (×8): 1 [IU] via SUBCUTANEOUS
  Administered 2019-03-01: 2 [IU] via SUBCUTANEOUS
  Administered 2019-03-01 – 2019-03-02 (×2): 1 [IU] via SUBCUTANEOUS
  Administered 2019-03-02: 2 [IU] via SUBCUTANEOUS
  Administered 2019-03-02 – 2019-03-03 (×3): 1 [IU] via SUBCUTANEOUS
  Administered 2019-03-03: 2 [IU] via SUBCUTANEOUS
  Administered 2019-03-04: 3 [IU] via SUBCUTANEOUS
  Administered 2019-03-04 (×2): 2 [IU] via SUBCUTANEOUS
  Administered 2019-03-05 (×3): 1 [IU] via SUBCUTANEOUS

## 2019-02-19 MED ORDER — ASPIRIN EC 81 MG PO TBEC: 81.0000 mg | DELAYED_RELEASE_TABLET | Freq: Every day | ORAL | Status: DC

## 2019-02-19 MED ORDER — IOHEXOL 350 MG/ML SOLN
60.0000 mL | Freq: Once | INTRAVENOUS | Status: AC | PRN
  Administered 2019-02-19: 60 mL via INTRAVENOUS

## 2019-02-19 MED ORDER — ASPIRIN 81 MG PO CHEW: 324.0000 mg | CHEWABLE_TABLET | Freq: Once | ORAL | Status: DC

## 2019-02-19 MED ORDER — MAGNESIUM OXIDE 400 (241.3 MG) MG PO TABS: 400.0000 mg | ORAL_TABLET | Freq: Two times a day (BID) | ORAL | Status: DC

## 2019-02-19 MED ORDER — DIGOXIN 125 MCG PO TABS: 0.2500 mg | ORAL_TABLET | Freq: Every day | ORAL | Status: DC

## 2019-02-19 MED ORDER — ENALAPRILAT 1.25 MG/ML IV SOLN
0.6250 mg | Freq: Four times a day (QID) | INTRAVENOUS | Status: DC | PRN
  Filled 2019-02-19: qty 0.5

## 2019-02-19 MED ORDER — SODIUM CHLORIDE 0.9 % IV SOLN: INTRAVENOUS | Status: AC

## 2019-02-19 MED ORDER — DIVALPROEX SODIUM 250 MG PO DR TAB: 500.0000 mg | DELAYED_RELEASE_TABLET | Freq: Three times a day (TID) | ORAL | Status: DC

## 2019-02-19 MED ORDER — QUETIAPINE FUMARATE 50 MG PO TABS: 200.0000 mg | ORAL_TABLET | Freq: Every day | ORAL | Status: DC

## 2019-02-19 MED ORDER — HALOPERIDOL LACTATE 5 MG/ML IJ SOLN
2.5000 mg | Freq: Four times a day (QID) | INTRAMUSCULAR | Status: DC | PRN
  Administered 2019-02-19 – 2019-03-04 (×4): 2.5 mg via INTRAVENOUS
  Filled 2019-02-19 (×4): qty 1

## 2019-02-19 MED ORDER — METOPROLOL TARTRATE 5 MG/5ML IV SOLN: 2.5000 mg | Freq: Four times a day (QID) | INTRAVENOUS | Status: DC | PRN

## 2019-02-19 MED ORDER — ATORVASTATIN CALCIUM 40 MG PO TABS: 40.0000 mg | ORAL_TABLET | Freq: Every day | ORAL | Status: DC

## 2019-02-19 MED ORDER — INSULIN GLARGINE 100 UNIT/ML ~~LOC~~ SOLN
10.0000 [IU] | Freq: Every day | SUBCUTANEOUS | Status: DC
  Administered 2019-02-20 – 2019-03-13 (×15): 10 [IU] via SUBCUTANEOUS
  Filled 2019-02-19 (×24): qty 0.1

## 2019-02-19 MED ORDER — METOPROLOL TARTRATE 12.5 MG HALF TABLET: 12.5000 mg | ORAL_TABLET | Freq: Four times a day (QID) | ORAL | Status: DC

## 2019-02-19 MED ORDER — ACETAMINOPHEN 325 MG PO TABS
650.0000 mg | ORAL_TABLET | ORAL | Status: DC | PRN
  Administered 2019-03-03 – 2019-03-08 (×6): 650 mg via ORAL
  Filled 2019-02-19 (×6): qty 2

## 2019-02-19 MED ORDER — ACETAMINOPHEN 160 MG/5ML PO SOLN: 650.0000 mg | ORAL | Status: DC | PRN

## 2019-02-19 MED ORDER — ACETAMINOPHEN 650 MG RE SUPP: 650.0000 mg | RECTAL | Status: DC | PRN

## 2019-02-19 MED ORDER — INSULIN ASPART 100 UNIT/ML ~~LOC~~ SOLN
0.0000 [IU] | Freq: Every day | SUBCUTANEOUS | Status: DC
  Administered 2019-03-04: 2 [IU] via SUBCUTANEOUS

## 2019-02-19 MED ORDER — ENOXAPARIN SODIUM 40 MG/0.4ML ~~LOC~~ SOLN
40.0000 mg | SUBCUTANEOUS | Status: DC
  Administered 2019-02-20 – 2019-02-22 (×3): 40 mg via SUBCUTANEOUS
  Filled 2019-02-19 (×3): qty 0.4

## 2019-02-19 MED ORDER — INSULIN GLARGINE 100 UNIT/ML ~~LOC~~ SOLN
20.0000 [IU] | Freq: Every day | SUBCUTANEOUS | Status: DC
  Filled 2019-02-19: qty 0.2

## 2019-02-19 MED ORDER — STROKE: EARLY STAGES OF RECOVERY BOOK
Freq: Once | Status: DC
  Filled 2019-02-19: qty 1

## 2019-02-19 MED ORDER — BUDESONIDE 0.5 MG/2ML IN SUSP
2.0000 mg | Freq: Two times a day (BID) | RESPIRATORY_TRACT | Status: DC
  Administered 2019-02-19: 2 mg via RESPIRATORY_TRACT
  Filled 2019-02-19 (×2): qty 8

## 2019-02-19 MED ORDER — SENNOSIDES-DOCUSATE SODIUM 8.6-50 MG PO TABS: 1.0000 | ORAL_TABLET | Freq: Every evening | ORAL | Status: DC | PRN

## 2019-02-19 MED ORDER — PANTOPRAZOLE SODIUM 40 MG PO PACK: 20.0000 mg | PACK | Freq: Every day | ORAL | Status: DC

## 2019-02-19 NOTE — H&P (Signed)
History and Physical    Amiel Mccaffrey WUJ:[id number] DOB: [birth year] DOA: 02/19/2019  PCP: System, Pcp Not In   Patient coming from: NH  I have personally briefly reviewed patient's old medical records in [facility]  Chief Complaint: AMS and right sided weakness.  HPI: Tyrone Cunningham is a 63 y.o. male with medical history significant of CHF, diabetes, chronic pain syndrome, was brought to ED via EMS from [facility] nursing facility for evaluation of altered mental status. Per nursing home staff, pt appears to have right sided facial drooping, right-sided weakness, and behaving himself this mornnig.  Last known normal was yesterday.  No other complaint, no recent sick contact with COVID-19 has been eating and drinking fine.  He is usually mostly bedbound but can walk on occasion.  He is trach dependent. ED Course: Head CT showed no acute changes and CTA showed no LVO. He was outside of the tPA window and not a candidate for thrombectomy because he did not have a large vessel occlusion.   Review of Systems: As per HPI otherwise 10 point review of systems negative.    Past Medical History:  Diagnosis Date   Bronchitis    CHF (congestive heart failure) (HCC)    grade 1 diastolic with preserved EF in 9562   COPD (chronic obstructive pulmonary disease) (HCC)    Diabetes mellitus without complication (HCC)    DM type 2 with diabetic peripheral neuropathy (HCC)    Hearing loss    Hypercholesteremia    Hypertension    Peripheral neuropathy    Sleep apnea    Severe sleep apnea requiring tracheostomy    Past Surgical History:  Procedure Laterality Date   APPENDECTOMY     CHOLECYSTECTOMY N/A 01/05/2014   Procedure: LAPAROSCOPIC CHOLECYSTECTOMY;  Surgeon: Emelia Loron, MD;  Location: [facility] ORS;  Service: General;  Laterality: N/A;   FEMUR HARDWARE REMOVAL  09/01/2003   Removal of retained hardware, two interlocking distal femoral   FEMUR SURGERY     HAND SURGERY      MULTIPLE EXTRACTIONS WITH ALVEOLOPLASTY N/A 05/11/2012   Procedure: MULTIPLE EXTRACTION WITH ALVEOLOPLASTY;  Surgeon: [name], DDS;  Location: [facility] OR;  Service: Oral Surgery;  Laterality: N/A;   TRACHEOSTOMY       reports that he quit smoking about 19 years ago. His smoking use included cigarettes. He has a 60.00 pack-year smoking history. He has never used smokeless tobacco. He reports that he does not drink alcohol or use drugs.  No Known Allergies  Family History  Problem Relation Age of Onset   Coronary artery disease Father      Prior to Admission medications   Medication Sig Start Date End Date Taking? Authorizing Provider  budesonide (PULMICORT) 0.5 MG/2ML nebulizer solution Take 2 mg by nebulization 2 (two) times daily.   Yes [provider]  clobetasol cream (TEMOVATE) 0.05 % Apply 1 application topically 2 (two) times daily. Apply to back and Chest   Yes [provider]  clonazePAM (KLONOPIN) 0.5 MG tablet Take 0.5 mg by mouth 2 (two) times daily.   Yes [provider]  digoxin (LANOXIN) 0.25 MG tablet Take 0.25 mg by mouth daily.   Yes [provider]  divalproex (DEPAKOTE) 500 MG DR tablet Take 500 mg by mouth 3 (three) times daily.   Yes [provider]  docusate sodium (COLACE) 100 MG capsule Take 100 mg by mouth 2 (two) times daily.   Yes [provider]  FLUoxetine (PROZAC)  20 MG tablet Take 20 mg by mouth daily.   Yes [provider]  gabapentin (NEURONTIN) 100 MG capsule Take 200 mg by mouth every 12 (twelve) hours.   Yes [provider]  insulin glargine (LANTUS) 100 UNIT/ML injection Inject 20 Units into the skin daily.   Yes [provider]  ipratropium-albuterol (DUONEB) 0.5-2.5 (3) MG/3ML SOLN Take 3 mLs by nebulization every 4 (four) hours.   Yes [provider]  lisinopril (ZESTRIL) 10 MG tablet Take 10 mg by mouth daily.   Yes [provider]  magnesium oxide  (MAG-OX) 400 MG tablet Take 400 mg by mouth 2 (two) times daily.   Yes [provider]  metoprolol tartrate (LOPRESSOR) 25 MG tablet Take 12.5 mg by mouth every 6 (six) hours. Hold if blood pressure is less than 130 systolic   Yes [provider]  pantoprazole sodium (PROTONIX) 40 mg/20 mL PACK Take 20 mg by mouth daily.   Yes [provider]  QUEtiapine (SEROQUEL) 200 MG tablet Take 200 mg by mouth at bedtime.   Yes [provider]  triamcinolone cream (KENALOG) 0.5 % Apply 1 application topically 2 (two) times daily.   Yes [provider]    Physical Exam: Vitals:   02/19/19 1515 02/19/19 1519 02/19/19 1545 02/19/19 1715  BP: (!) 146/75  (!) 133/98 (!) 162/94  Pulse: 75  74 70  Resp: Temp:      TempSrc:      SpO2: 99%  99% 98%  Weight:  122.5 kg    Height:   (1.702 m)      Constitutional: NAD, calm, comfortable Vitals:   02/19/19 1515 02/19/19 1519 02/19/19 1545 02/19/19 1715  BP: (!) 146/75  (!) 133/98 (!) 162/94  Pulse: 75  74 70  Resp: Temp:      TempSrc:      SpO2: 99%  99% 98%  Weight:  122.5 kg    Height:   (1.702 m)     Eyes: PERRL, lids and conjunctivae normal ENMT: Mucous membranes are moist. Posterior pharynx clear of any exudate or lesions.Normal dentition.  Neck: normal, supple, no masses, no thyromegaly Respiratory: clear to auscultation bilaterally, no wheezing, no crackles. Normal respiratory effort. No accessory muscle use.  Cardiovascular: Regular rate and rhythm, no murmurs / rubs / gallops. No extremity edema. 2+ pedal pulses. No carotid bruits.  Abdomen: no tenderness, no masses palpated. No hepatosplenomegaly. Bowel sounds positive.  Musculoskeletal: no clubbing / cyanosis. No joint deformity upper and lower extremities. Good ROM, no contractures. Normal muscle tone.  Skin: no rashes, lesions, ulcers. No induration Neurologic: Right-sided facial droop, left-sided weakness  compared to left side, sensation grossly intact, right-sided neglect. Psychiatric: Normal judgment and insight. Alert and oriented x 3. Normal mood.    Labs on Admission: I have personally reviewed following labs and imaging studies  CBC: Recent Labs  Lab 02/19/19 1522 02/19/19 1527  WBC 15.7*  --   NEUTROABS 12.2*  --   HGB 14.6 15.0  HCT 44.8 44.0  MCV 92.0  --   PLT 227  --    Basic Metabolic Panel: Recent Labs  Lab 02/19/19 1522 02/19/19 1527  NA 137 136  K 5.1 4.9  CL 99 102  CO2 27  --   GLUCOSE 205* 198*  BUN 33* 35*  CREATININE 1.38* 1.10  CALCIUM 9.3  --    GFR: Estimated Creatinine Clearance:  84 mL/min (by C-G formula based on SCr of 1.1 mg/dL). Liver Function Tests: Recent Labs  Lab 02/19/19 1522  AST 14*  ALT 17  ALKPHOS 82  BILITOT 0.3  PROT 7.0  ALBUMIN 3.0*   No results for input(s): LIPASE, AMYLASE in the last 168 hours. No results for input(s): AMMONIA in the last 168 hours. Coagulation Profile: Recent Labs  Lab 02/19/19 1522  INR 1.0   Cardiac Enzymes: No results for input(s): CKTOTAL, CKMB, CKMBINDEX, TROPONINI in the last 168 hours. BNP (last 3 results) No results for input(s): PROBNP in the last 8760 hours. HbA1C: No results for input(s): HGBA1C in the last 72 hours. CBG: Recent Labs  Lab 02/19/19 1454  GLUCAP 166*   Lipid Profile: No results for input(s): CHOL, HDL, LDLCALC, TRIG, CHOLHDL, LDLDIRECT in the last 72 hours. Thyroid Function Tests: No results for input(s): TSH, T4TOTAL, FREET4, T3FREE, THYROIDAB in the last 72 hours. Anemia Panel: No results for input(s): VITAMINB12, FOLATE, FERRITIN, TIBC, IRON, RETICCTPCT in the last 72 hours. Urine analysis:    Component Value Date/Time   COLORURINE YELLOW 10/19/2017 0542   APPEARANCEUR CLEAR 10/19/2017 0542   LABSPEC 1.016 10/19/2017 0542   PHURINE 7.0 10/19/2017 0542   GLUCOSEU >=500 (A) 10/19/2017 0542   HGBUR NEGATIVE 10/19/2017 0542   BILIRUBINUR NEGATIVE  10/19/2017 0542   BILIRUBINUR neg 01/03/2014 1527   KETONESUR NEGATIVE 10/19/2017 0542   PROTEINUR >=300 (A) 10/19/2017 0542   UROBILINOGEN 1.0 01/03/2014 1814   NITRITE NEGATIVE 10/19/2017 0542   LEUKOCYTESUR NEGATIVE 10/19/2017 0542    Radiological Exams on Admission: CT Angio Head W or Wo Contrast  Result Date: 02/19/2019 CLINICAL DATA:  Right upper and lower extremity weakness. EXAM: CT ANGIOGRAPHY HEAD AND NECK TECHNIQUE: Multidetector CT imaging of the head and neck was performed using the standard protocol during bolus administration of intravenous contrast. Multiplanar CT image reconstructions and MIPs were obtained to evaluate the vascular anatomy. Carotid stenosis measurements (when applicable) are obtained utilizing NASCET criteria, using the distal internal carotid diameter as the denominator. CONTRAST:  60mL OMNIPAQUE IOHEXOL 350 MG/ML SOLN COMPARISON:  None. FINDINGS: CTA NECK FINDINGS Aortic arch: Standard 3 vessel aortic arch with moderate atherosclerotic plaque. No significant arch vessel origin stenosis. Right carotid system: Patent with scattered mild, predominantly calcified plaque in the common carotid and proximal internal carotid arteries. No evidence of significant stenosis or dissection. Left carotid system: Patent with scattered calcified and soft plaque in the common carotid artery and moderate, predominantly calcified plaque at the carotid bifurcation and in the carotid bulb. No evidence of significant stenosis or dissection. Vertebral arteries: Patent with the right being mildly dominant. Calcified plaque at the vertebral artery origins results in severe stenosis on the right and mild stenosis on the left. Skeleton: Left canal wall up mastoidectomy. Congenital C2-3 fusion. Severe right facet arthrosis at C3-4. Other neck: No evidence of cervical lymphadenopathy or mass. Upper chest: Mild mosaic attenuation in the lung apices, possibly air trapping. Scattered small nodules in  both lung apices with the largest measuring 4 mm in the right upper lobe (series 5, image 180). Review of the MIP images confirms the above findings CTA HEAD FINDINGS Anterior circulation: The internal carotid arteries are patent from skull base to carotid termini with calcified plaque resulting in mild-to-moderate left greater than right cavernous and mild left paraclinoid stenoses. ACAs and MCAs are patent with moderate distal branch vessel irregular narrowing but no evidence of proximal branch occlusion or significant proximal stenosis. No  aneurysm is identified. Posterior circulation: The intracranial vertebral arteries are widely patent to the basilar. Patent right AICA and bilateral SCA origins are visualized. PICAs and a left AICA are not clearly identified. The basilar artery is patent with a mild-to-moderate stenosis in its midportion. There is a medium-sized left posterior communicating artery. Both PCAs are attenuated with moderate to severe P1 and severe P2 stenoses bilaterally. No aneurysm is identified. Venous sinuses: As permitted by contrast timing, patent. Anatomic variants: None. Review of the MIP images confirms the above findings IMPRESSION: 1. No emergent large vessel occlusion. 2. Age advanced intracranial atherosclerosis including moderate to severe bilateral PCA stenoses in the setting of chronic bilateral PCA infarcts. 3. Mild-to-moderate basilar artery and bilateral intracranial ICA stenoses. 4. Severe right vertebral artery origin stenosis. 5. Small biapical lung nodules measuring up to 4 mm. No follow-up needed if patient is low-risk (and has no known or suspected primary neoplasm). Non-contrast chest CT can be considered in 12 months if patient is high-risk. This recommendation follows the consensus statement: Guidelines for Management of Incidental Pulmonary Nodules Detected on CT Images: From the Fleischner Society 2017; Radiology 2017; 284:228-243. 6. Aortic Atherosclerosis  (ICD10-I70.0). Electronically Signed   By: Sebastian Ache M.D.   On: 02/19/2019 17:03   CT HEAD WO CONTRAST  Result Date: 02/19/2019 CLINICAL DATA:  Right-sided weakness EXAM: CT HEAD WITHOUT CONTRAST TECHNIQUE: Contiguous axial images were obtained from the base of the skull through the vertex without intravenous contrast. COMPARISON:  November 26, 2018. FINDINGS: Brain: No evidence of acute territorial infarction, hemorrhage, hydrocephalus,extra-axial collection or mass lesion/mass effect. There is dilatation the ventricles and sulci consistent with age-related atrophy. Low-attenuation changes in the deep white matter consistent with small vessel ischemia. Again noted is extensive areas of encephalomalacia involving the bilateral posterior parietooccipital lobes with continued evolution from the prior exam. Vascular: No hyperdense vessel or unexpected calcification. Skull: The skull is intact. No fracture or focal lesion identified. Sinuses/Orbits: The visualized paranasal sinuses and mastoid air cells are clear. The orbits and globes intact. Other: None IMPRESSION: Findings consistent with age related atrophy and chronic small vessel ischemia Continued evolution of encephalomalacia in the bilateral posterior parieto-occipital lobes. Electronically Signed   By: Jonna Clark M.D.   On: 02/19/2019 15:47   CT Angio Neck W and/or Wo Contrast  Result Date: 02/19/2019 CLINICAL DATA:  Right upper and lower extremity weakness. EXAM: CT ANGIOGRAPHY HEAD AND NECK TECHNIQUE: Multidetector CT imaging of the head and neck was performed using the standard protocol during bolus administration of intravenous contrast. Multiplanar CT image reconstructions and MIPs were obtained to evaluate the vascular anatomy. Carotid stenosis measurements (when applicable) are obtained utilizing NASCET criteria, using the distal internal carotid diameter as the denominator. CONTRAST:  60mL OMNIPAQUE IOHEXOL 350 MG/ML SOLN COMPARISON:   None. FINDINGS: CTA NECK FINDINGS Aortic arch: Standard 3 vessel aortic arch with moderate atherosclerotic plaque. No significant arch vessel origin stenosis. Right carotid system: Patent with scattered mild, predominantly calcified plaque in the common carotid and proximal internal carotid arteries. No evidence of significant stenosis or dissection. Left carotid system: Patent with scattered calcified and soft plaque in the common carotid artery and moderate, predominantly calcified plaque at the carotid bifurcation and in the carotid bulb. No evidence of significant stenosis or dissection. Vertebral arteries: Patent with the right being mildly dominant. Calcified plaque at the vertebral artery origins results in severe stenosis on the right and mild stenosis on the left. Skeleton: Left canal wall up mastoidectomy.  Congenital C2-3 fusion. Severe right facet arthrosis at C3-4. Other neck: No evidence of cervical lymphadenopathy or mass. Upper chest: Mild mosaic attenuation in the lung apices, possibly air trapping. Scattered small nodules in both lung apices with the largest measuring 4 mm in the right upper lobe (series 5, image 180). Review of the MIP images confirms the above findings CTA HEAD FINDINGS Anterior circulation: The internal carotid arteries are patent from skull base to carotid termini with calcified plaque resulting in mild-to-moderate left greater than right cavernous and mild left paraclinoid stenoses. ACAs and MCAs are patent with moderate distal branch vessel irregular narrowing but no evidence of proximal branch occlusion or significant proximal stenosis. No aneurysm is identified. Posterior circulation: The intracranial vertebral arteries are widely patent to the basilar. Patent right AICA and bilateral SCA origins are visualized. PICAs and a left AICA are not clearly identified. The basilar artery is patent with a mild-to-moderate stenosis in its midportion. There is a medium-sized left  posterior communicating artery. Both PCAs are attenuated with moderate to severe P1 and severe P2 stenoses bilaterally. No aneurysm is identified. Venous sinuses: As permitted by contrast timing, patent. Anatomic variants: None. Review of the MIP images confirms the above findings IMPRESSION: 1. No emergent large vessel occlusion. 2. Age advanced intracranial atherosclerosis including moderate to severe bilateral PCA stenoses in the setting of chronic bilateral PCA infarcts. 3. Mild-to-moderate basilar artery and bilateral intracranial ICA stenoses. 4. Severe right vertebral artery origin stenosis. 5. Small biapical lung nodules measuring up to 4 mm. No follow-up needed if patient is low-risk (and has no known or suspected primary neoplasm). Non-contrast chest CT can be considered in 12 months if patient is high-risk. This recommendation follows the consensus statement: Guidelines for Management of Incidental Pulmonary Nodules Detected on CT Images: From the Fleischner Society 2017; Radiology 2017; 284:228-243. 6. Aortic Atherosclerosis (ICD10-I70.0). Electronically Signed   By: Logan Bores M.D.   On: 02/19/2019 17:03   DG Chest Portable 1 View  Result Date: 02/19/2019 CLINICAL DATA:  Altered mental status. Tracheostomy patient. History of hypertension, congestive heart failure, diabetes and COPD. EXAM: PORTABLE CHEST 1 VIEW COMPARISON:  Radiographs 10/19/2017 and 10/12/2017. FINDINGS: 1656 hours. Tracheostomy appears well positioned. The heart size and mediastinal contours are stable. There is interval improved aeration of the right lung base with probable mild chronic basilar scarring. No new airspace disease, edema, pleural effusion or pneumothorax. The bones appear unchanged. Telemetry leads overlie the chest. IMPRESSION: Interval improved aeration of the right lung base. No acute cardiopulmonary process. Electronically Signed   By: Richardean Sale M.D.   On: 02/19/2019 17:13    EKG: Independently  reviewed.   Assessment/Plan Active Problems:   Stroke (cerebrum) (HCC)   Stroke, with right-sided weakness, CT angiogram negative for large vessel occlusion, MRI ordered.  Aspirin and statin.  PT OT evaluation, speech evaluation.  Neurology on board.  Allow permissive hypertension for today, hold lisinopril, as needed Vasotec  Chronic diastolic CHF, check digoxin level, continue beta-blocker.  Clinically looks like euvolemic.  IDDM, cut long-acting to half, add sliding scale.  Hypertension, as above.  Hyperlipidemia, on statin.  Chronic hypoxic respiratory failure status post tracheostomy, at his baseline, no acute issue.         DVT prophylaxis: Lovenox Code Status: Full Family Communication: None at bedside Disposition Plan: likely will go back to NH. Consults called: Neurology Admission status: Tele admission   Lequita Halt MD [facility] Pager [phone]  If 7PM-7AM, please contact  night-coverage [email or web address] Password [facility]  02/19/2019, 7:42 PM

## 2019-02-19 NOTE — ED Notes (Signed)
Patient back from CT with RN

## 2019-02-19 NOTE — Consult Note (Addendum)
NEURO HOSPITALIST CONSULT NOTE   Requesting Physician: Dr. Chipper Herb    Chief Complaint: Code Stroke  History obtained from:  Patient and Chart   HPI:                                                                                                                                         Tyrone Cunningham is an 63 y.o. male with history of CHF, diabetes, chronic pain syndrome and trach dependent brought here via EMS from [facility]. He was brought in for altered mental status. According to ED notes the nursing home last saw him well yesterday and noticed a change this morning. The nursing home said he does not normally have any weakness, he is mostly bed bound, but is able to ambulate. The patient himself states he is able to walk and able to see normally.   The emergency team notified Dr. Laurence Slate that the patient was VAN positive. The patient had right facial droop, right side weakness and visual field changes. Head CT showed no acute changes and CTA showed no LVO. He was outside of the tPA window and not a candidate for thrombectomy because he did not have a large vessel occlusion.   Date last known well: Date: 02/18/2019 Time last known well: Unable to determine tPA Given: No: outside tPA Window No Symptoms         0 No significant disability/able to carry out all usual activities   1 Unable to carry out all previous activities but looks after own affairs             2 Requires help but walks without assistance     3 Unable to walk without assistance/unable to handle own bodily needs 4 Bedridden/incontinent        5 Dead          6  Modified Rankin: Rankin Score=2  Past Medical History:  Diagnosis Date  . Bronchitis   . CHF (congestive heart failure) (HCC)    grade 1 diastolic with preserved EF in 6578  . COPD (chronic obstructive pulmonary disease) (HCC)   . Diabetes mellitus without complication (HCC)   . DM type 2 with diabetic peripheral neuropathy (HCC)   . Hearing  loss   . Hypercholesteremia   . Hypertension   . Peripheral neuropathy   . Sleep apnea    Severe sleep apnea requiring tracheostomy    Past Surgical History:  Procedure Laterality Date  . APPENDECTOMY    . CHOLECYSTECTOMY N/A 01/05/2014   Procedure: LAPAROSCOPIC CHOLECYSTECTOMY;  Surgeon: Emelia Loron, MD;  Location: [facility];  Service: General;  Laterality: N/A;  . FEMUR HARDWARE REMOVAL  09/01/2003   Removal of retained hardware, two interlocking distal femoral  . FEMUR SURGERY    . HAND SURGERY    . MULTIPLE EXTRACTIONS WITH ALVEOLOPLASTY N/A 05/11/2012   Procedure: MULTIPLE EXTRACTION WITH ALVEOLOPLASTY;  Surgeon:  [name], DDS;  Location: [facility] OR;  Service: Oral Surgery;  Laterality: N/A;  . TRACHEOSTOMY      Family History  Problem Relation Age of Onset  . Coronary artery disease Father          Social History:  reports that he quit smoking about 19 years ago. His smoking use included cigarettes. He has a 60.00 pack-year smoking history. He has never used smokeless tobacco. He reports that he does not drink alcohol or use drugs.  Allergies: No Known Allergies  Medications:                                                                                                                           I have reviewed the patient's current medications.  ROS:                                                                                                                                       History obtained from the patient  General ROS: negative for - chills, fatigue, fever, weight gain or weight loss Psychological ROS: negative for - memory difficulties Ophthalmic ROS: negative for baseline blurry vision, double vision, eye pain or loss of vision ENT ROS: negative for - epistaxis, nasal discharge,  Respiratory ROS: negative for - cough,  shortness of breath or wheezing Cardiovascular ROS: negative for - chest pain, dyspnea on exertion,  Gastrointestinal ROS: negative for -  abdominal pain, diarrhea,  nausea/vomiting  Musculoskeletal ROS: negative for - joint swelling or muscular weakness Neurological ROS: as noted in HPI   General Examination:                                                                                                      Blood pressure (!) 162/94, pulse 70, temperature 97.7 F (36.5 C), temperature source Oral, resp. rate 15, height  (1.702 m), weight 122.5 kg, SpO2 98 %.  Physical Exam  Constitutional: Appears well-developed  and well-nourished.  Psych: Affect appropriate to situation Eyes: Normal external eye and conjunctiva. HENT: Normocephalic, no lesions, without obvious abnormality.   Musculoskeletal-no joint tenderness, deformity or swelling Cardiovascular: Normal rate and regular rhythm.  Respiratory: Effort normal, non-labored breathing saturations WNL GI: Soft.  No distension. There is no tenderness.  Skin: WDI  Neurological Examination Mental Status: Alert, oriented to year and month, said he didn't remember how old he was. Speech fluent without evidence of aphasia.  Able to follow commands without difficulty. Cranial Nerves: II:  No response to visual threat, not able to describe objects in the room. Seemed to be guessing when asked how many fingers he could see, inconsistent answers.  III,IV, VI: ptosis not present, extra-ocular motions intact bilaterally V,VII: right facial droop  Minimal movement to right side, moves fingers on right hand and attempted to lift right leg.    NIHSS: 1a 0 1b questions 1 1c commands 0 ee movement 0 Visual fields 3 Facial palsy 2 Left arm 0 Right arm 3 Left Leg 0 Right Leg 3 Ataxia 0 Sensation 0 Aphasia 0 Dysarthria 0 Extinction 0  Total 12    Lab Results: Basic Metabolic Panel: Recent Labs  Lab 02/19/19 1522 02/19/19 1527  NA 137 136  K 5.1 4.9  CL 99 102  CO2 27  --   GLUCOSE 205* 198*  BUN 33* 35*  CREATININE 1.38* 1.10  CALCIUM 9.3  --      CBC: Recent Labs  Lab 02/19/19 1522 02/19/19 1527  WBC 15.7*  --   NEUTROABS 12.2*  --   HGB 14.6 15.0  HCT 44.8 44.0  MCV 92.0  --   PLT 227  --     Lipid Panel: No results for input(s): CHOL, TRIG, HDL, CHOLHDL, VLDL, LDLCALC in the last 168 hours.  CBG: Recent Labs  Lab 02/19/19 1454  GLUCAP 166*    Imaging: CT Angio Head W or Wo Contrast  Result Date: 02/19/2019 CLINICAL DATA:  Right upper and lower extremity weakness. EXAM: CT ANGIOGRAPHY HEAD AND NECK TECHNIQUE: Multidetector CT imaging of the head and neck was performed using the standard protocol during bolus administration of intravenous contrast. Multiplanar CT image reconstructions and MIPs were obtained to evaluate the vascular anatomy. Carotid stenosis measurements (when applicable) are obtained utilizing NASCET criteria, using the distal internal carotid diameter as the denominator. CONTRAST:  53mL OMNIPAQUE IOHEXOL 350 MG/ML SOLN COMPARISON:  None. FINDINGS: CTA NECK FINDINGS Aortic arch: Standard 3 vessel aortic arch with moderate atherosclerotic plaque. No significant arch vessel origin stenosis. Right carotid system: Patent with scattered mild, predominantly calcified plaque in the common carotid and proximal internal carotid arteries. No evidence of significant stenosis or dissection. Left carotid system: Patent with scattered calcified and soft plaque in the common carotid artery and moderate, predominantly calcified plaque at the carotid bifurcation and in the carotid bulb. No evidence of significant stenosis or dissection. Vertebral arteries: Patent with the right being mildly dominant. Calcified plaque at the vertebral artery origins results in severe stenosis on the right and mild stenosis on the left. Skeleton: Left canal wall up mastoidectomy. Congenital C2-3 fusion. Severe right facet arthrosis at C3-4. Other neck: No evidence of cervical lymphadenopathy or mass. Upper chest: Mild mosaic attenuation in the  lung apices, possibly air trapping. Scattered small nodules in both lung apices with the largest measuring 4 mm in the right upper lobe (series 5, image 180). Review of the MIP images confirms the above findings CTA HEAD FINDINGS  Anterior circulation: The internal carotid arteries are patent from skull base to carotid termini with calcified plaque resulting in mild-to-moderate left greater than right cavernous and mild left paraclinoid stenoses. ACAs and MCAs are patent with moderate distal branch vessel irregular narrowing but no evidence of proximal branch occlusion or significant proximal stenosis. No aneurysm is identified. Posterior circulation: The intracranial vertebral arteries are widely patent to the basilar. Patent right AICA and bilateral SCA origins are visualized. PICAs and a left AICA are not clearly identified. The basilar artery is patent with a mild-to-moderate stenosis in its midportion. There is a medium-sized left posterior communicating artery. Both PCAs are attenuated with moderate to severe P1 and severe P2 stenoses bilaterally. No aneurysm is identified. Venous sinuses: As permitted by contrast timing, patent. Anatomic variants: None. Review of the MIP images confirms the above findings IMPRESSION: 1. No emergent large vessel occlusion. 2. Age advanced intracranial atherosclerosis including moderate to severe bilateral PCA stenoses in the setting of chronic bilateral PCA infarcts. 3. Mild-to-moderate basilar artery and bilateral intracranial ICA stenoses. 4. Severe right vertebral artery origin stenosis. 5. Small biapical lung nodules measuring up to 4 mm. No follow-up needed if patient is low-risk (and has no known or suspected primary neoplasm). Non-contrast chest CT can be considered in 12 months if patient is high-risk. This recommendation follows the consensus statement: Guidelines for Management of Incidental Pulmonary Nodules Detected on CT Images: From the Fleischner Society 2017;  Radiology 2017; 284:228-243. 6. Aortic Atherosclerosis (ICD10-I70.0). Electronically Signed   By: Sebastian Ache M.D.   On: 02/19/2019 17:03   CT HEAD WO CONTRAST  Result Date: 02/19/2019 CLINICAL DATA:  Right-sided weakness EXAM: CT HEAD WITHOUT CONTRAST TECHNIQUE: Contiguous axial images were obtained from the base of the skull through the vertex without intravenous contrast. COMPARISON:  November 26, 2018. FINDINGS: Brain: No evidence of acute territorial infarction, hemorrhage, hydrocephalus,extra-axial collection or mass lesion/mass effect. There is dilatation the ventricles and sulci consistent with age-related atrophy. Low-attenuation changes in the deep white matter consistent with small vessel ischemia. Again noted is extensive areas of encephalomalacia involving the bilateral posterior parietooccipital lobes with continued evolution from the prior exam. Vascular: No hyperdense vessel or unexpected calcification. Skull: The skull is intact. No fracture or focal lesion identified. Sinuses/Orbits: The visualized paranasal sinuses and mastoid air cells are clear. The orbits and globes intact. Other: None IMPRESSION: Findings consistent with age related atrophy and chronic small vessel ischemia Continued evolution of encephalomalacia in the bilateral posterior parieto-occipital lobes. Electronically Signed   By: Jonna Clark M.D.   On: 02/19/2019 15:47   CT Angio Neck W and/or Wo Contrast  Result Date: 02/19/2019 CLINICAL DATA:  Right upper and lower extremity weakness. EXAM: CT ANGIOGRAPHY HEAD AND NECK TECHNIQUE: Multidetector CT imaging of the head and neck was performed using the standard protocol during bolus administration of intravenous contrast. Multiplanar CT image reconstructions and MIPs were obtained to evaluate the vascular anatomy. Carotid stenosis measurements (when applicable) are obtained utilizing NASCET criteria, using the distal internal carotid diameter as the denominator.  CONTRAST:  4mL OMNIPAQUE IOHEXOL 350 MG/ML SOLN COMPARISON:  None. FINDINGS: CTA NECK FINDINGS Aortic arch: Standard 3 vessel aortic arch with moderate atherosclerotic plaque. No significant arch vessel origin stenosis. Right carotid system: Patent with scattered mild, predominantly calcified plaque in the common carotid and proximal internal carotid arteries. No evidence of significant stenosis or dissection. Left carotid system: Patent with scattered calcified and soft plaque in the common carotid artery and moderate,  predominantly calcified plaque at the carotid bifurcation and in the carotid bulb. No evidence of significant stenosis or dissection. Vertebral arteries: Patent with the right being mildly dominant. Calcified plaque at the vertebral artery origins results in severe stenosis on the right and mild stenosis on the left. Skeleton: Left canal wall up mastoidectomy. Congenital C2-3 fusion. Severe right facet arthrosis at C3-4. Other neck: No evidence of cervical lymphadenopathy or mass. Upper chest: Mild mosaic attenuation in the lung apices, possibly air trapping. Scattered small nodules in both lung apices with the largest measuring 4 mm in the right upper lobe (series 5, image 180). Review of the MIP images confirms the above findings CTA HEAD FINDINGS Anterior circulation: The internal carotid arteries are patent from skull base to carotid termini with calcified plaque resulting in mild-to-moderate left greater than right cavernous and mild left paraclinoid stenoses. ACAs and MCAs are patent with moderate distal branch vessel irregular narrowing but no evidence of proximal branch occlusion or significant proximal stenosis. No aneurysm is identified. Posterior circulation: The intracranial vertebral arteries are widely patent to the basilar. Patent right AICA and bilateral SCA origins are visualized. PICAs and a left AICA are not clearly identified. The basilar artery is patent with a mild-to-moderate  stenosis in its midportion. There is a medium-sized left posterior communicating artery. Both PCAs are attenuated with moderate to severe P1 and severe P2 stenoses bilaterally. No aneurysm is identified. Venous sinuses: As permitted by contrast timing, patent. Anatomic variants: None. Review of the MIP images confirms the above findings IMPRESSION: 1. No emergent large vessel occlusion. 2. Age advanced intracranial atherosclerosis including moderate to severe bilateral PCA stenoses in the setting of chronic bilateral PCA infarcts. 3. Mild-to-moderate basilar artery and bilateral intracranial ICA stenoses. 4. Severe right vertebral artery origin stenosis. 5. Small biapical lung nodules measuring up to 4 mm. No follow-up needed if patient is low-risk (and has no known or suspected primary neoplasm). Non-contrast chest CT can be considered in 12 months if patient is high-risk. This recommendation follows the consensus statement: Guidelines for Management of Incidental Pulmonary Nodules Detected on CT Images: From the Fleischner Society 2017; Radiology 2017; 284:228-243. 6. Aortic Atherosclerosis (ICD10-I70.0). Electronically Signed   By: Sebastian AcheAllen  Grady M.D.   On: 02/19/2019 17:03   DG Chest Portable 1 View  Result Date: 02/19/2019 CLINICAL DATA:  Altered mental status. Tracheostomy patient. History of hypertension, congestive heart failure, diabetes and COPD. EXAM: PORTABLE CHEST 1 VIEW COMPARISON:  Radiographs 10/19/2017 and 10/12/2017. FINDINGS: 1656 hours. Tracheostomy appears well positioned. The heart size and mediastinal contours are stable. There is interval improved aeration of the right lung base with probable mild chronic basilar scarring. No new airspace disease, edema, pleural effusion or pneumothorax. The bones appear unchanged. Telemetry leads overlie the chest. IMPRESSION: Interval improved aeration of the right lung base. No acute cardiopulmonary process. Electronically Signed   By: Carey BullocksWilliam  Veazey  M.D.   On: 02/19/2019 17:13     Assessment: 63 y.o. male with history of CHF, diabetes, chronic pain syndrome and trach dependent [facility] Last seen normal yesterday by nursing staff. Reported to ED that he normally has no weakness and is able to ambulate. The patient himself reports he is normally able to ambulate and does not have trouble with vision. On exam he has significant right side weakness, minimal movement. 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 He occassionally guess when asked about  number of fingers. Full EOM, able to move eyes to voices in room. NIHSS 12. CT head negative for acute stroke. CTA negative for LVO. Outside tPA window and not a candidate for thrombectomy, given no large vessel occlusion.   Impression:  Stroke not seen on CT however symptoms remain consistent with stroke and MRI recommended.  Stroke Risk Factors - diabetes mellitus  Recommendations: --HgbA1c, fasting lipid panel --MRI of the brain --Aspirin  daily for stroke prevention  --PT consult, OT consult, Speech consult --Echocardiogram --Statin: Currently on Atorvastatin  --Lovenox for DVT Proph has been ordered  --Telemetry monitoring --Frequent neuro checks --NPO until passes stroke swallow screen  Cathrine Muster DNP, FNP-C [facility] Nurse Practitioner  Recommendations to follow from attending neurologist   02/19/2019, 6:06 PM    NEUROHOSPITALIST ADDENDUM Performed a face to face diagnostic evaluation.   I have reviewed the contents of history and physical exam as documented by PA/ARNP/Resident and agree with above documentation.  I have discussed and formulated the above plan as documented. Edits to the note have been made as needed.  Patient with history of CHF, diabetes, chronic pain, trach dependent presents today ED from [facility]: Noted to be plegic on the right arm today.  Last known normal  unclear but yesterday, today morning he was noted to have weakness and patient finally brought to the emergency department in the afternoon. Neurology was consulted for concern for stroke, on exam he has right facial droop, right hemiplegia.  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  Patient is a poor historian and is unable to tell us when his right side became weak CT angiogram was obtained negative for large vessel occlusion.  Not a candidate for TPA as he is outside the window and not a candidate for thrombectomy as not LVO.  Recommend admission for stroke work-up, MRI brain, echocardiogram, A1c, lipid profile, n.p.o. until passes stroke swallow screen.  Etiology of stroke: Pending work-up agree with continuing statin and aspirin.   Stroke team will continue to follow the patient    Arther Dames MD [facility] [phone]   If 7pm to 7am, please call on call as listed on AMION.

## 2019-02-19 NOTE — Progress Notes (Signed)
Patient arrived to 3W06. A&O to self. RN attempted to look at pt skin but pt is being combative and not allowing RN or NT to touch him. RN unable to do assessment. Patient calling staff the "N-word" and telling us to leave him alone. Call bell in reach, bed alarm on. Respiratory notified of pt arrival.

## 2019-02-19 NOTE — Progress Notes (Addendum)
Patient alert but refusing care, swinging at staff with his left hand, used mittens to prevent him from pulling tele, has shirley #6 cuff-less, disposable, NPO at the moment, On call aware of the medications not administered PO, tachy sometimes as pt tries to take off the tele, as well as his gown, monitoring conitnue

## 2019-02-19 NOTE — ED Provider Notes (Signed)
Medical screening examination/treatment/procedure(s) were conducted as a shared visit with non-physician practitioner(s) and myself.  I personally evaluated the patient during the encounter.  EKG Interpretation  Date/Time:  Saturday February 19 2019 14:57:11 EST Ventricular Rate:  73 PR Interval:    QRS Duration: 98 QT Interval:  382 QTC Calculation: 421 R Axis:   -5 Text Interpretation: Sinus rhythm RSR' in V1 or V2, right VCD or RVH No significant change since last tracing Confirmed by Lacretia Leigh [phone]) on 02/19/2019 3:48:49 PM 63 year old male presents with right upper and right lower extremity weakness.  Last seen normal was yesterday.  On exam he has 0/5 strength in his right upper as well as right lower extremity.  Head CT without evidence of bleed.  Patient will require admission and further evaluation.  For his likely CVA   Lacretia Leigh, MD 02/19/19 1555

## 2019-02-19 NOTE — ED Provider Notes (Signed)
[facility] EMERGENCY DEPARTMENT Provider Note   CSN: [id number] Arrival date & time:        History Chief Complaint  Patient presents with  . Altered Mental Status    Tyrone Cunningham is a 63 y.o. male.  The history is provided by the nursing home, the EMS personnel and medical records. No language interpreter was used.  Altered Mental Status      63 year old male with history of CHF, diabetes, chronic pain syndrome, brought here via EMS from [facility] nursing facility for evaluation of altered mental status. Per nursing home staff, pt appears altered at shift change at this morning.  Pt not behaving himself.  Last known normal was yesterday.  The nurse that I spoke with states that patient did not have any focal weakness at baseline.  No other complaint, no recent sick contact with COVID-19 has been eating and drinking fine.  He is usually mostly bedbound but can walk on occasion.  He is trach dependent.  Past Medical History:  Diagnosis Date  . Bronchitis   . CHF (congestive heart failure) (HCC)    grade 1 diastolic with preserved EF in 4098  . COPD (chronic obstructive pulmonary disease) (HCC)   . Diabetes mellitus without complication (HCC)   . DM type 2 with diabetic peripheral neuropathy (HCC)   . Hearing loss   . Hypercholesteremia   . Hypertension   . Peripheral neuropathy   . Sleep apnea    Severe sleep apnea requiring tracheostomy    Patient Active Problem List   Diagnosis Date Noted  . HCAP (healthcare-associated pneumonia) 10/09/2017  . Hypokalemia 10/09/2017  . Abnormal liver function 10/09/2017  . Severe protein-calorie malnutrition (HCC) 10/09/2017  . Cellulitis and abscess of left leg 08/04/2017  . Diabetic foot ulcer (HCC) 08/04/2017  . Acute renal failure (ARF) (HCC) 08/04/2017  . SOB (shortness of breath) 09/13/2016  . Peripheral vascular disease (HCC) 08/19/2016  . Tracheostomy care (HCC) 06/28/2015  . Obstructive sleep apnea  06/28/2015  . Chronic pain syndrome 04/18/2015  . Constipation 01/07/2014  . Constipation, chronic 01/04/2014  . Chronic respiratory failure (HCC) 01/06/2013  . Edema 03/16/2012  . Obesity 10/20/2011  . 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 [name] with frequent exacerbations   . CHF (congestive heart failure) (HCC)   . Hypertension   . Hypercholesteremia   . Hearing loss   . Diabetic neuropathy [facility])     Past Surgical History:  Procedure Laterality Date  . APPENDECTOMY    . CHOLECYSTECTOMY N/A 01/05/2014   Procedure: LAPAROSCOPIC CHOLECYSTECTOMY;  Surgeon: Emelia Loron, MD;  Location: [facility] ORS;  Service: General;  Laterality: N/A;  . FEMUR HARDWARE REMOVAL  09/01/2003   Removal of retained hardware, two interlocking distal femoral  . FEMUR SURGERY    . HAND SURGERY    . MULTIPLE EXTRACTIONS WITH ALVEOLOPLASTY N/A 05/11/2012   Procedure: MULTIPLE EXTRACTION WITH ALVEOLOPLASTY;  Surgeon: [name], DDS;  Location: [facility] OR;  Service: Oral Surgery;  Laterality: N/A;  . TRACHEOSTOMY         Family History  Problem Relation Age of Onset  . Coronary artery disease Father     Social History   Tobacco Use  . Smoking status: Former Smoker    Packs/day: 2.00    Years: 30.00    Pack years: 60.00    Types: Cigarettes    Quit date: 07/02/1999  Years since quitting: 19.6  . Smokeless tobacco: Never Used  Substance Use Topics  . Alcohol use: No    Comment: heavy drinker, quit in 1983  . Drug use: No    Comment: used "everything but heroin, I had my days"    Home Medications Prior to Admission medications   Medication Sig Start Date End Date Taking? Authorizing Provider  albuterol (PROVENTIL HFA;VENTOLIN HFA) 108 (90 Base) MCG/ACT inhaler Inhale 2 puffs into the lungs every 6 (six) hours as needed for wheezing or shortness of breath. 09/10/17   Weber, Damaris Hippo, PA-C  albuterol  (PROVENTIL) (2.5 MG/3ML) 0.083% nebulizer solution Take 3 mLs (2.5 mg total) by nebulization every 6 (six) hours as needed for wheezing or shortness of breath. 11/03/17   Weber, Damaris Hippo, PA-C  atorvastatin (LIPITOR) 20 MG tablet Take 1 tablet (20 mg total) by mouth daily. 09/09/17   Weber, [name], PA-C  BELBUCA 75 MCG FILM Place 1 Film inside cheek 2 (two) times daily as needed (pain).  10/05/17   [provider]  furosemide (LASIX) 20 MG tablet TAKE 1 TABLET BY MOUTH DAILY 11/24/17   Rutherford Guys, MD  glipiZIDE (GLUCOTROL) 5 MG tablet Take 1 tablet (5 mg total) by mouth 2 (two) times daily before a meal. 09/09/17   Weber, Damaris Hippo, PA-C  hydrOXYzine (ATARAX/VISTARIL) 25 MG tablet Take 1 tablet (25 mg total) by mouth every 6 (six) hours as needed for anxiety. 10/15/17   Doreatha Lew, MD  metoprolol tartrate (LOPRESSOR) 50 MG tablet Take 1.5 tablets (75 mg total) by mouth 2 (two) times daily. 09/15/17   Weber, Damaris Hippo, PA-C  oxyCODONE-acetaminophen (PERCOCET) 10-325 MG tablet Take 1 tablet by mouth every 6 (six) hours as needed for pain.     [provider]  OXYGEN Inhale 10 L into the lungs continuous.    [provider]  polyethylene glycol powder (GLYCOLAX/MIRALAX) powder Take 17 g by mouth daily. 09/09/17   Weber, Damaris Hippo, PA-C  traMADol (ULTRAM) 50 MG tablet Take 50-100 mg by mouth 3 (three) times daily as needed for moderate pain.    [provider]  umeclidinium bromide (INCRUSE ELLIPTA) 62.5 MCG/INH AEPB Inhale 1 puff into the lungs daily. 09/09/17   Gale Journey, Damaris Hippo, PA-C    Allergies    Patient has no known allergies.  Review of Systems   Review of Systems  Unable to perform ROS: Mental status change    Physical Exam Updated Vital Signs BP (!) 146/75   Pulse 75   Temp 97.7 F (36.5 C) (Oral)   Resp 16   Ht 5\' 7"  (1.702 m)   Wt 122.5 kg   SpO2 99%   BMI 42.29 kg/m   Physical Exam Vitals and nursing note reviewed.  Constitutional:       General: He is not in acute distress.    Appearance: He is well-developed. He is obese.     Comments: Morbidly obese male, trach dependent.  HENT:     Head: Atraumatic.  Eyes:     General: Visual field deficit present.     Extraocular Movements: Extraocular movements intact.     Conjunctiva/sclera: Conjunctivae normal.     Pupils: Pupils are equal, round, and reactive to light.     Comments: No obvious visual field cut  Cardiovascular:     Rate and Rhythm: Normal rate and regular rhythm.  Abdominal:     Palpations: Abdomen is soft.     Tenderness:  There is abdominal tenderness (Mild diffuse tenderness without guarding or rebound tenderness.  Bowel sounds present.).  Musculoskeletal:     Cervical back: Neck supple.  Skin:    Findings: No rash.  Neurological:     Mental Status: He is alert.     GCS: GCS eye subscore is 4. GCS verbal subscore is 3. GCS motor subscore is 6.     Cranial Nerves: Cranial nerve deficit, dysarthria and facial asymmetry present.     Sensory: Sensation is intact.     Motor: Weakness (Right upper and lower extremity flaccidity compared to left.) present.     Comments: 0/5 strength to RUE and RLE 5/5 strength to LUE and LLE R sided facial droop     ED Results / Procedures / Treatments   Labs (all labs ordered are listed, but only abnormal results are displayed) Labs Reviewed  CBC - Abnormal; Notable for the following components:      Result Value   WBC 15.7 (*)    All other components within normal limits  DIFFERENTIAL - Abnormal; Notable for the following components:   Neutro Abs 12.2 (*)    Abs Immature Granulocytes 0.73 (*)    All other components within normal limits  COMPREHENSIVE METABOLIC PANEL - Abnormal; Notable for the following components:   Glucose, Bld 205 (*)    BUN 33 (*)    Creatinine, Ser 1.38 (*)    Albumin 3.0 (*)    AST 14 (*)    GFR calc non Af Amer 53 (*)    All other components within normal limits  CBG MONITORING, ED -  Abnormal; Notable for the following components:   Glucose-Capillary 166 (*)    All other components within normal limits  I-STAT CHEM 8, ED - Abnormal; Notable for the following components:   BUN 35 (*)    Glucose, Bld 198 (*)    Calcium, Ion 1.06 (*)    All other components within normal limits  SARS CORONAVIRUS 2 (TAT 6-24 HRS)  PROTIME-INR  APTT  ETHANOL  RAPID URINE DRUG SCREEN, HOSP PERFORMED  URINALYSIS, ROUTINE W REFLEX MICROSCOPIC    EKG EKG Interpretation  Date/Time:  Saturday February 19 2019 14:57:11 EST Ventricular Rate:  73 PR Interval:    QRS Duration: 98 QT Interval:  382 QTC Calculation: 421 R Axis:   -5 Text Interpretation: Sinus rhythm RSR' in V1 or V2, right VCD or RVH No significant change since last tracing Confirmed by Lorre Nick ([id number]) on 02/19/2019 3:49:23 PM   Radiology CT HEAD WO CONTRAST  Result Date: 02/19/2019 CLINICAL DATA:  Right-sided weakness EXAM: CT HEAD WITHOUT CONTRAST TECHNIQUE: Contiguous axial images were obtained from the base of the skull through the vertex without intravenous contrast. COMPARISON:  November 26, 2018. FINDINGS: Brain: No evidence of acute territorial infarction, hemorrhage, hydrocephalus,extra-axial collection or mass lesion/mass effect. There is dilatation the ventricles and sulci consistent with age-related atrophy. Low-attenuation changes in the deep white matter consistent with small vessel ischemia. Again noted is extensive areas of encephalomalacia involving the bilateral posterior parietooccipital lobes with continued evolution from the prior exam. Vascular: No hyperdense vessel or unexpected calcification. Skull: The skull is intact. No fracture or focal lesion identified. Sinuses/Orbits: The visualized paranasal sinuses and mastoid air cells are clear. The orbits and globes intact. Other: None IMPRESSION: Findings consistent with age related atrophy and chronic small vessel ischemia Continued evolution of  encephalomalacia in the bilateral posterior parieto-occipital lobes. Electronically Signed   By: Kandis Fantasia  Avutu M.D.   On: 02/19/2019 15:47    Procedures .Critical Care Performed by: Fayrene Helperran, Kylin Dubs, PA-C Authorized by: Fayrene Helperran, Morty Ortwein, PA-C   Critical care provider statement:    Critical care time (minutes):  45   Critical care was time spent personally by me on the following activities:  Discussions with consultants, evaluation of patient's response to treatment, examination of patient, ordering and performing treatments and interventions, ordering and review of laboratory studies, ordering and review of radiographic studies, pulse oximetry, re-evaluation of patient's condition, obtaining history from patient or surrogate and review of old charts   (including critical care time)  Medications Ordered in ED Medications  aspirin chewable tablet 324 mg (has no administration in time range)    ED Course  I have reviewed the triage vital signs and the nursing notes.  Pertinent labs & imaging results that were available during my care of the patient were reviewed by me and considered in my medical decision making (see chart for details).    MDM Rules/Calculators/A&P                      BP (!) 146/75   Pulse 75   Temp 97.7 F (36.5 C) (Oral)   Resp 16   Ht 5\' 7"  (1.702 m)   Wt 122.5 kg   SpO2 99%   BMI 42.29 kg/m   Final Clinical Impression(s) / ED Diagnoses Final diagnoses:  Hemiparesis of right dominant side, unspecified hemiparesis etiology (HCC)    Rx / DC Orders ED Discharge Orders    None     3:34 PM Patient lives at a nursing facility, brought here due to altered mental status.  On exam it appears patient has right-sided flaccidity and neglect, right facial droop as well dysarthria.  Last known normal was yesterday.  No prior history of stroke.  He is trach dependent but in no acute respiratory problem.  He is VAN positive (facial droop, R side flaccidity).  Code stroke  activated.   4:30 PM Head CT scan demonstrate age-related atrophy and chronic small vessel ischemia as well as continued evidence of encephalomalacia in the bilateral posterior parietal occipital lobes however no acute finding was noted.  Appreciate consultation from on-call neurologist, Dr. Laurence SlateAroor who request for brain MRI along with head and neck CT angiogram for further evaluation.  I have also consulted tried hospitalist and spoke with Dr. Chipper HerbZhang who agrees to see and admit patient.  Unsure patient is within the window for TPA at this time TPA was not given. ASA given.  Care discussed with Dr. Freida BusmanAllen. COVID-19 test ordered  Tyrone Cunningham was evaluated in Emergency Department on 02/19/2019 for the symptoms described in the history of present illness. He was evaluated in the context of the global COVID-19 pandemic, which necessitated consideration that the patient might be at risk for infection with the SARS-CoV-2 virus that causes COVID-19. Institutional protocols and algorithms that pertain to the evaluation of patients at risk for COVID-19 are in a state of rapid change based on information released by regulatory bodies including the CDC and federal and state organizations. These policies and algorithms were followed during the patient's care in the ED.    Fayrene Helperran, Zaiyden Strozier, PA-C 02/19/19 1637    Lorre NickAllen, Anthony, MD 02/20/19 [phone]

## 2019-02-19 NOTE — Progress Notes (Signed)
Called to patient's bedside after recently being transferred from the ER. Patient resides at a facility and is trache dependent.  Received pt on VM and changed to  40% aerosol trache.  Pt refused to allow me to touch him or assess his trach.  He kept acting like he was going to hit me with his left hand.  Informed by RN LB that he has a #6 trach in place; however, I was unable to confirm.  Emergency equipment:   Ambu bag, suction catheters and suction cleaning kit at bedside.  Airway sheet sent to RN on 3W via tube.  Patient's Covid status is pending; however, he is NOT in a negative pressure room.

## 2019-02-19 NOTE — ED Triage Notes (Signed)
Pt BIB [facility] from [facility] for altered mental status. Per EMS patient was found to be not acting himself this morning at 0700. Unknown when he was last known well. Reported that he can sit up on his own and have somewhat of a conversation at his baseline.   [facility] VS HR 76 BP 168/84 Temp 97.7 CBG 226 98% on 4L via trach collar

## 2019-02-20 ENCOUNTER — Inpatient Hospital Stay (HOSPITAL_COMMUNITY): Payer: Medicare Other

## 2019-02-20 ENCOUNTER — Encounter (HOSPITAL_COMMUNITY): Payer: Medicare Other

## 2019-02-20 DIAGNOSIS — I10 Essential (primary) hypertension: Secondary | ICD-10-CM

## 2019-02-20 DIAGNOSIS — I6389 Other cerebral infarction: Secondary | ICD-10-CM

## 2019-02-20 LAB — CBC
HCT: 40 % (ref 39.0–52.0)
Hemoglobin: 13.4 g/dL (ref 13.0–17.0)
MCH: 30.4 pg (ref 26.0–34.0)
MCHC: 33.5 g/dL (ref 30.0–36.0)
MCV: 90.7 fL (ref 80.0–100.0)
Platelets: 224 10*3/uL (ref 150–400)
RBC: 4.41 MIL/uL (ref 4.22–5.81)
RDW: 13.2 % (ref 11.5–15.5)
WBC: 14 10*3/uL — ABNORMAL HIGH (ref 4.0–10.5)
nRBC: 0 % (ref 0.0–0.2)

## 2019-02-20 LAB — BASIC METABOLIC PANEL
Anion gap: 9 (ref 5–15)
BUN: 29 mg/dL — ABNORMAL HIGH (ref 8–23)
CO2: 26 mmol/L (ref 22–32)
Calcium: 8.9 mg/dL (ref 8.9–10.3)
Chloride: 104 mmol/L (ref 98–111)
Creatinine, Ser: 1.07 mg/dL (ref 0.61–1.24)
GFR calc Af Amer: >60 mL/min (ref >60)
GFR calc non Af Amer: >60 mL/min (ref >60)
Glucose, Bld: 131 mg/dL — ABNORMAL HIGH (ref 70–99)
Potassium: 4 mmol/L (ref 3.5–5.1)
Sodium: 139 mmol/L (ref 135–145)

## 2019-02-20 LAB — ETHANOL: Alcohol, Ethyl (B): <10 mg/dL (ref <10)

## 2019-02-20 LAB — ECHOCARDIOGRAM COMPLETE
Height: 67 in
Weight: 4319.99 oz

## 2019-02-20 LAB — LIPID PANEL
Cholesterol: 128 mg/dL (ref 0–200)
HDL: 27 mg/dL — ABNORMAL LOW (ref >40)
LDL Cholesterol: 57 mg/dL (ref 0–99)
Total CHOL/HDL Ratio: 4.7 RATIO
Triglycerides: 221 mg/dL — ABNORMAL HIGH (ref <150)
VLDL: 44 mg/dL — ABNORMAL HIGH (ref 0–40)

## 2019-02-20 LAB — HIV ANTIBODY (ROUTINE TESTING W REFLEX): HIV Screen 4th Generation wRfx: NONREACTIVE

## 2019-02-20 LAB — GLUCOSE, CAPILLARY
Glucose-Capillary: 135 mg/dL — ABNORMAL HIGH (ref 70–99)
Glucose-Capillary: 122 mg/dL — ABNORMAL HIGH (ref 70–99)
Glucose-Capillary: 103 mg/dL — ABNORMAL HIGH (ref 70–99)
Glucose-Capillary: 115 mg/dL — ABNORMAL HIGH (ref 70–99)

## 2019-02-20 LAB — DIGOXIN LEVEL: Digoxin Level: <0.2 ng/mL — ABNORMAL LOW (ref 0.8–2.0)

## 2019-02-20 LAB — HEMOGLOBIN A1C
Hgb A1c MFr Bld: 8.3 % — ABNORMAL HIGH (ref 4.8–5.6)
Mean Plasma Glucose: 191.51 mg/dL

## 2019-02-20 MED ORDER — LORAZEPAM 2 MG/ML IJ SOLN: 1.0000 mg | Freq: Once | INTRAMUSCULAR | Status: DC | PRN

## 2019-02-20 MED ORDER — ASPIRIN 300 MG RE SUPP
300.0000 mg | Freq: Every day | RECTAL | Status: DC
  Administered 2019-02-20 – 2019-02-23 (×4): 300 mg via RECTAL
  Filled 2019-02-20 (×4): qty 1

## 2019-02-20 MED ORDER — ALBUTEROL SULFATE (2.5 MG/3ML) 0.083% IN NEBU: 2.5000 mg | INHALATION_SOLUTION | RESPIRATORY_TRACT | Status: DC | PRN

## 2019-02-20 MED ORDER — METOPROLOL TARTRATE 5 MG/5ML IV SOLN
5.0000 mg | Freq: Four times a day (QID) | INTRAVENOUS | Status: DC
  Administered 2019-02-20 – 2019-02-21 (×3): 5 mg via INTRAVENOUS
  Filled 2019-02-20 (×3): qty 5

## 2019-02-20 MED ORDER — VALPROATE SODIUM 500 MG/5ML IV SOLN
500.0000 mg | Freq: Three times a day (TID) | INTRAVENOUS | Status: DC
  Administered 2019-02-20 – 2019-02-24 (×12): 500 mg via INTRAVENOUS
  Filled 2019-02-20 (×15): qty 5

## 2019-02-20 MED ORDER — PERFLUTREN LIPID MICROSPHERE
1.0000 mL | INTRAVENOUS | Status: AC | PRN
  Administered 2019-02-20: 3 mL via INTRAVENOUS
  Filled 2019-02-20: qty 10

## 2019-02-20 MED ORDER — IPRATROPIUM-ALBUTEROL 0.5-2.5 (3) MG/3ML IN SOLN
3.0000 mL | Freq: Three times a day (TID) | RESPIRATORY_TRACT | Status: DC
  Administered 2019-02-21 – 2019-03-02 (×27): 3 mL via RESPIRATORY_TRACT
  Filled 2019-02-20 (×25): qty 3

## 2019-02-20 MED ORDER — BUDESONIDE 0.5 MG/2ML IN SUSP
0.5000 mg | Freq: Two times a day (BID) | RESPIRATORY_TRACT | Status: DC
  Administered 2019-02-20 – 2019-03-11 (×39): 0.5 mg via RESPIRATORY_TRACT
  Filled 2019-02-20 (×38): qty 2

## 2019-02-20 MED ORDER — LORAZEPAM 2 MG/ML IJ SOLN
1.0000 mg | Freq: Two times a day (BID) | INTRAMUSCULAR | Status: DC | PRN
  Administered 2019-02-20 – 2019-03-01 (×6): 1 mg via INTRAVENOUS
  Filled 2019-02-20 (×6): qty 1

## 2019-02-20 MED ORDER — DIGOXIN 0.25 MG/ML IJ SOLN
0.2500 mg | Freq: Every day | INTRAMUSCULAR | Status: DC
  Administered 2019-02-20 – 2019-02-23 (×4): 0.25 mg via INTRAVENOUS
  Filled 2019-02-20 (×7): qty 1

## 2019-02-20 NOTE — Evaluation (Signed)
Occupational Therapy Evaluation Patient Details Name: Tyrone Cunningham MRN: [id number] DOB: Apr 01, 1955 Today's Date: 02/20/2019    History of Present Illness Tyrone Cunningham is a 62 y.o. male with PMHx: chronic CHF, diabetes, chronic pain syndrome, chronic trach, was brought to ED via EMS from [facility] for AMS with R sided facial drooping, R-sided weakness. Pt is usually bedbound but can walk on occasion; Pt is trach dependent. 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 Incidental lung nodule found.   Clinical Impression   Pt PTA: Pt living in SNF. Pt currently pt limited by no AROM in R side, decreased strength, decreased activity tolerance and increased assist from caregivers required. Pt had to arouse by loud stimulus and then was able to speak by OTR closing valve and followed all commands once aroused. Pt limited by no AROM noted in RUE/RLE. Pt with pain with hip external rotation to midline. Pt's L side appears, WFLs for ROM and strength. BP 88/67 at EOB. Pt returned to supine and dizziness resolved. Pt reports to feel sensation on R side.  Pt totalA to maxA +2 for bed mobility; pt requires maxA to sit EOB without leaning posteriorly and to the right. Pt would benefit from continued OT skilled services for ADL, mobility and safety in SNF setting. OT following.    Follow Up Recommendations  SNF;Supervision/Assistance - 24 hour    Equipment Recommendations  Other (comment)(to be determined at next facility)    Recommendations for Other Services       Precautions / Restrictions Precautions Precautions: Fall;Other (comment) Precaution Comments: trach dependent Restrictions Weight Bearing Restrictions: No      Mobility Bed Mobility Overal bed mobility: Needs Assistance Bed Mobility: Rolling;Sidelying to Sit;Sit to Sidelying;Sit to Supine Rolling: Max assist;Total assist(maxA to roll to R and totalA to roll to L) Sidelying to sit: Max  assist;Total assist;+2 for physical assistance;+2 for safety/equipment;HOB elevated   Sit to supine: Max assist;+2 for physical assistance;+2 for safety/equipment Sit to sidelying: Max assist;Total assist;+2 for physical assistance;+2 for safety/equipment;HOB elevated General bed mobility comments: maxA +2 for bed mobility; pt unable to assist with trunk elevation.  Transfers                 General transfer comment: RN staff will need lift; pt unable to balance at EOB in sitting    Balance Overall balance assessment: Needs assistance Sitting-balance support: Feet supported;Single extremity supported Sitting balance-Leahy Scale: Poor   Postural control: Posterior lean;Right lateral lean                                 ADL either performed or assessed with clinical judgement   ADL Overall ADL's : Needs assistance/impaired Eating/Feeding: NPO   Grooming: Maximal assistance;Sitting;Cueing for safety   Upper Body Bathing: Maximal assistance;Sitting;Cueing for safety   Lower Body Bathing: Maximal assistance;Total assistance;+2 for physical assistance;Sitting/lateral leans;Bed level   Upper Body Dressing : Moderate assistance;Maximal assistance;Cueing for safety;Sitting   Lower Body Dressing: Total assistance;+2 for physical assistance;+2 for safety/equipment;Sitting/lateral leans;Bed level       Toileting- Clothing Manipulation and Hygiene: Total assistance;+2 for physical assistance;+2 for safety/equipment;Cueing for safety;Sitting/lateral lean;Bed level       Functional mobility during ADLs: Maximal assistance;Total assistance;+2 for safety/equipment General ADL Comments: pt limited by no AROM in R side, decreased strength, decreased activity tolerance and increased assist from caregivers required.  Vision Baseline Vision/History: No visual deficits Patient Visual Report: No change from baseline Vision Assessment?: Yes;Vision impaired- to be further  tested in functional context Eye Alignment: Within Functional Limits Ocular Range of Motion: Impaired-to be further tested in functional context Alignment/Gaze Preference: Within Defined Limits Tracking/Visual Pursuits: Decreased smoothness of horizontal tracking Additional Comments: Pt preferring to look straight up or to left and appeared to be hallucinating when looking to R. pt able to localize to sounds and commands.     Perception     Praxis      Pertinent Vitals/Pain Pain Assessment: Faces Faces Pain Scale: Hurts a little bit Pain Location: Movement of R hip out of Pain Descriptors / Indicators: Grimacing Pain Intervention(s): Monitored during session     Hand Dominance Left   Extremity/Trunk Assessment Upper Extremity Assessment Upper Extremity Assessment: Generalized weakness;RUE deficits/detail;LUE deficits/detail RUE Deficits / Details: No AROM noted, pt reports to feel sensation RUE Sensation: WNL RUE Coordination: decreased fine motor;decreased gross motor LUE Deficits / Details: RUE, AROM is WFLs; 4-/5 MM grade LUE Coordination: WNL   Lower Extremity Assessment Lower Extremity Assessment: Defer to PT evaluation;RLE deficits/detail RLE Deficits / Details: no AROM noted   Cervical / Trunk Assessment Cervical / Trunk Assessment: Kyphotic   Communication Communication Communication: Tracheostomy   Cognition Arousal/Alertness: Lethargic;Awake/alert Behavior During Therapy: WFL for tasks assessed/performed Overall Cognitive Status: Within Functional Limits for tasks assessed                                 General Comments: Pt had to arouse by loud stimulus and then was able to speak by OTR closing valve and followed all commands once aroused   General Comments  VSS, BP was 88/67 when pt asked if he was dizzy, pt said "kind of."    Exercises     Shoulder Instructions      Home Living Family/patient expects to be discharged to:: Skilled  nursing facility     Type of Home: Skilled Nursing Facility                           Additional Comments: Pt speaking about his brother and hoping that he knew his condition. RN had spoken with pt's brother earlier this AM.      Prior Functioning/Environment Level of Independence: Needs assistance  Gait / Transfers Assistance Needed: Per chart, pt was ambulatory at times, but remained mostly bed bound ADL's / Homemaking Assistance Needed: Pt required assist with ADL Communication / Swallowing Assistance Needed: Trach collar at baseline          OT Problem List: Decreased strength;Decreased range of motion;Decreased activity tolerance;Impaired balance (sitting and/or standing);Impaired vision/perception;Decreased coordination;Decreased safety awareness;Impaired UE functional use;Obesity;Pain;Increased edema      OT Treatment/Interventions: Therapeutic exercise;Self-care/ADL training;Neuromuscular education;Energy conservation;DME and/or AE instruction;Therapeutic activities;Cognitive remediation/compensation;Patient/family education;Balance training    OT Goals(Current goals can be found in the care plan section) Acute Rehab OT Goals Patient Stated Goal: to get stronger on R side OT Goal Formulation: With patient Time For Goal Achievement: 03/06/19 Potential to Achieve Goals: Good ADL Goals Pt Will Perform Grooming: with min assist;sitting Pt Will Perform Upper Body Dressing: with mod assist;sitting Pt/caregiver will Perform Home Exercise Program: Increased strength;Increased ROM;Right Upper extremity;With minimal assist Additional ADL Goal #1: Pt will attend to R side throughout functional tasks with minimal cueing required 75% of the time  OT Frequency:  Min 2X/week   Barriers to D/C:            Co-evaluation PT/OT/SLP Co-Evaluation/Treatment: Yes Reason for Co-Treatment: Complexity of the patient's impairments (multi-system involvement);For patient/therapist  safety   OT goals addressed during session: Strengthening/ROM      AM-PAC OT "6 Clicks" Daily Activity     Outcome Measure Help from another person eating meals?: Total Help from another person taking care of personal grooming?: A Lot Help from another person toileting, which includes using toliet, bedpan, or urinal?: Total Help from another person bathing (including washing, rinsing, drying)?: Total Help from another person to put on and taking off regular upper body clothing?: A Lot Help from another person to put on and taking off regular lower body clothing?: Total 6 Click Score: 8   End of Session Equipment Utilized During Treatment: Oxygen Nurse Communication: Mobility status;Need for lift equipment  Activity Tolerance: Patient tolerated treatment well Patient left: in bed;with call bell/phone within reach;with bed alarm set  OT Visit Diagnosis: Unsteadiness on feet (R26.81);Muscle weakness (generalized) (M62.81);Pain;Hemiplegia and hemiparesis Hemiplegia - Right/Left: Right Hemiplegia - caused by: Cerebral infarction Pain - Right/Left: Right Pain - part of body: Leg                Time: 1400-1421 OT Time Calculation (min): 21 min Charges:  OT General Charges $OT Visit: 1 Visit OT Evaluation $OT Eval Moderate Complexity: 1 Mod  Cristi Loron) Glendell Docker OTR/L Acute Rehabilitation Services Pager: [phone] Office: [phone]   Lonzo Cloud 02/20/2019, 3:08 PM

## 2019-02-20 NOTE — Progress Notes (Signed)
Echocardiogram 2D Echocardiogram has been performed.  Tyrone Cunningham 02/20/2019, 3:33 PM

## 2019-02-20 NOTE — Progress Notes (Signed)
PROGRESS NOTE    Tyrone Cunningham  ZOX:[id number] DOB: 1955/08/12 DOA: 02/19/2019 PCP: System, Pcp Not In     Brief Narrative:  Tyrone Cunningham is a 63 y.o. male with past medical history significant of chronic CHF, diabetes, chronic pain syndrome, chronic trach, was brought to ED via EMS from [facility] nursing facility for evaluation of altered mental status.Per nursing home staff, pt appears to have right sided facial drooping, right-sided weakness, and not behaving himself this mornnig. Last known normal was day before admission. He is usually mostly bedbound but can walk on occasion. He is trach dependent. In the ED, head CT showed no acute changes and CTA showed no LVO. He was outside of the tPA window and not a candidate for thrombectomy because he did not have a large vessel occlusion.Neurology consulted.   New events last 24 hours / Subjective: Remains a little bit confused, has mitten on left hand as he has been trying to take out his trach. He is oriented x 3 and able to follow commands however.   Assessment & Plan:   Active Problems:   Stroke (cerebrum) (HCC)   Right sided hemiplegia, right facial droop  -CT head: Findings consistent with age related atrophy and chronic small vessel ischemia. Continued evolution of encephalomalacia in the bilateral posterior parieto-occipital lobes. -CTA head and neck: No emergent large vessel occlusion. Age advanced intracranial atherosclerosis including moderate to severe bilateral PCA stenoses in the setting of chronic bilateral PCA infarcts. -Not a candidate for TPA, thrombectomy  -PT OT SLP.  Remains n.p.o. for now per SLP eval  -Neurology following  -MRI brain pending -Echo pending -Aspirin   Essential hypertension -Hold lisinopril, metoprolol to allow for permissive hypertension  Chronic diastolic heart failure -Digoxin  Type 2 diabetes, insulin-dependent, poorly controlled -Ha1c 8.3 -Lantus, sliding scale  insulin  Hyperlipidemia -Resume statin when able to take p.o.  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 No follow-up needed if patient is low-risk (and has no known or suspected primary neoplasm). Non-contrast chest CT can be considered in 12 months if patient is high-risk.  -Outpatient follow up   Unclear why he is on depakote and seroquel. Changed depakote to IV while NPO. Hold seroquel for now due to NPO.    DVT prophylaxis: Lovenox  Code Status: Full Family Communication: None at bedside Disposition Plan: Pending neurology work up    Consultants:   Neurology  Procedures:   None   Antimicrobials:  Anti-infectives (From admission, onward)   None        Objective: Vitals:   02/20/19 0706 02/20/19 0720 02/20/19 0751 02/20/19 0837  BP:   135/74   Pulse: (!) 112 (!) 106 (!) 106   Resp: (!) 21 (!) 22 (!) 25   Temp:   98.6 F (37 C)   TempSrc:   Oral   SpO2: 96% 96% 96% 96%  Weight:      Height:        Intake/Output Summary (Last 24 hours) at 02/20/2019 1022 Last data filed at 02/19/2019 2347 Gross per 24 hour  Intake --  Output 300 ml  Net -300 ml   Filed Weights   02/19/19 1519  Weight: 122.5 kg    Examination:  General exam: Appears slightly confused, hand mitten in place left hand  Respiratory system: Clear to auscultation. Respiratory effort normal. No  respiratory distress. No conversational dyspnea. Trach collar in place  Cardiovascular system: S1 & S2 heard, Tachycardic, regular rhythm. No murmurs. No pedal edema. Gastrointestinal system: Abdomen is nondistended, soft and nontender. Normal bowel sounds heard. Central nervous system: Alert and oriented x3. Right facial droop, tongue deviation to the right, right hemiplegia.  Extremities: Symmetric in appearance  Skin: No rashes, lesions or  ulcers on exposed skin    Data Reviewed: I have personally reviewed following labs and imaging studies  CBC: Recent Labs  Lab 02/19/19 1522 02/19/19 1527  WBC 15.7*  --   NEUTROABS 12.2*  --   HGB 14.6 15.0  HCT 44.8 44.0  MCV 92.0  --   PLT 227  --    Basic Metabolic Panel: Recent Labs  Lab 02/19/19 1522 02/19/19 1527  NA 137 136  K 5.1 4.9  CL 99 102  CO2 27  --   GLUCOSE 205* 198*  BUN 33* 35*  CREATININE 1.38* 1.10  CALCIUM 9.3  --    GFR: Estimated Creatinine Clearance: 84 mL/min (by C-G formula based on SCr of 1.1 mg/dL). Liver Function Tests: Recent Labs  Lab 02/19/19 1522  AST 14*  ALT 17  ALKPHOS 82  BILITOT 0.3  PROT 7.0  ALBUMIN 3.0*   No results for input(s): LIPASE, AMYLASE in the last 168 hours. No results for input(s): AMMONIA in the last 168 hours. Coagulation Profile: Recent Labs  Lab 02/19/19 1522  INR 1.0   Cardiac Enzymes: No results for input(s): CKTOTAL, CKMB, CKMBINDEX, TROPONINI in the last 168 hours. BNP (last 3 results) No results for input(s): PROBNP in the last 8760 hours. HbA1C: Recent Labs    02/20/19 0522  HGBA1C 8.3*   CBG: Recent Labs  Lab 02/19/19 1454 02/19/19 2132 02/20/19 0554  GLUCAP 166* 99 135*   Lipid Profile: Recent Labs    02/20/19 0522  CHOL 128  HDL 27*  LDLCALC 57  TRIG 161*  CHOLHDL 4.7   Thyroid Function Tests: No results for input(s): TSH, T4TOTAL, FREET4, T3FREE, THYROIDAB in the last 72 hours. Anemia Panel: No results for input(s): VITAMINB12, FOLATE, FERRITIN, TIBC, IRON, RETICCTPCT in the last 72 hours. Sepsis Labs: No results for input(s): PROCALCITON, LATICACIDVEN in the last 168 hours.  Recent Results (from the past 240 hour(s))  SARS CORONAVIRUS 2 (TAT 6-24 HRS) Nasopharyngeal Nasopharyngeal Swab     Status: None   Collection Time: 02/19/19  3:44 PM   Specimen: Nasopharyngeal Swab  Result Value Ref Range Status   SARS Coronavirus 2 NEGATIVE NEGATIVE Final    Comment:  (NOTE) SARS-CoV-2 target nucleic acids are NOT DETECTED. The SARS-CoV-2 RNA is generally detectable in upper and lower respiratory specimens during the acute phase of infection. Negative results do not preclude SARS-CoV-2 infection, do not rule out co-infections with other pathogens, and should not be used as the sole basis for treatment or other patient management decisions. Negative results must be combined with clinical observations, patient history, and epidemiological information. The expected result is Negative. Fact Sheet for Patients: HairSlick.no Fact Sheet for Healthcare Providers: quierodirigir.com This test is not yet approved or cleared by the [place] FDA and  has been authorized for detection and/or diagnosis of SARS-CoV-2 by FDA under an Emergency Use Authorization (EUA). This EUA will remain  in effect (meaning this test can be used) for the duration of the COVID-19 declaration under Section 56 4(b)(1) of the Act, 21 U.S.C. section 360bbb-3(b)(1), unless the authorization is terminated or revoked sooner.  Performed at [facility], [address] [place], [place] [address]       Radiology Studies: CT Angio Head W or Wo Contrast  Result Date: 02/19/2019 CLINICAL DATA:  Right upper and lower extremity weakness. EXAM: CT ANGIOGRAPHY HEAD AND NECK TECHNIQUE: Multidetector CT imaging of the head and neck was performed using the standard protocol during bolus administration of intravenous contrast. Multiplanar CT image reconstructions and MIPs were obtained to evaluate the vascular anatomy. Carotid stenosis measurements (when applicable) are obtained utilizing NASCET criteria, using the distal internal carotid diameter as the denominator. CONTRAST:  40mL OMNIPAQUE IOHEXOL 350 MG/ML SOLN COMPARISON:  None. FINDINGS: CTA NECK FINDINGS Aortic arch: Standard 3 vessel aortic arch with moderate atherosclerotic plaque.  No significant arch vessel origin stenosis. Right carotid system: Patent with scattered mild, predominantly calcified plaque in the common carotid and proximal internal carotid arteries. No evidence of significant stenosis or dissection. Left carotid system: Patent with scattered calcified and soft plaque in the common carotid artery and moderate, predominantly calcified plaque at the carotid bifurcation and in the carotid bulb. No evidence of significant stenosis or dissection. Vertebral arteries: Patent with the right being mildly dominant. Calcified plaque at the vertebral artery origins results in severe stenosis on the right and mild stenosis on the left. Skeleton: Left canal wall up mastoidectomy. Congenital C2-3 fusion. Severe right facet arthrosis at C3-4. Other neck: No evidence of cervical lymphadenopathy or mass. Upper chest: Mild mosaic attenuation in the lung apices, possibly air trapping. Scattered small nodules in both lung apices with the largest measuring 4 mm in the right upper lobe (series 5, image 180). Review of the MIP images confirms the above findings CTA HEAD FINDINGS Anterior circulation: The internal carotid arteries are patent from skull base to carotid termini with calcified plaque resulting in mild-to-moderate left greater than right cavernous and mild left paraclinoid stenoses. ACAs and MCAs are patent with moderate distal branch vessel irregular narrowing but no evidence of proximal branch occlusion or significant proximal stenosis. No aneurysm is identified. Posterior circulation: The intracranial vertebral arteries are widely patent to the basilar. Patent right AICA and bilateral SCA origins are visualized. PICAs and a left AICA are not clearly identified. The basilar artery is patent with a mild-to-moderate stenosis in its midportion. There is a medium-sized left posterior communicating artery. Both PCAs are attenuated with moderate to severe P1 and severe P2 stenoses bilaterally.  No aneurysm is identified. Venous sinuses: As permitted by contrast timing, patent. Anatomic variants: None. Review of the MIP images confirms the above findings IMPRESSION: 1. No emergent large vessel occlusion. 2. Age advanced intracranial atherosclerosis including moderate to severe bilateral PCA stenoses in the setting of chronic bilateral PCA infarcts. 3. Mild-to-moderate basilar artery and bilateral intracranial ICA stenoses. 4. Severe right vertebral artery origin stenosis. 5. Small biapical lung nodules measuring up to 4 mm. No follow-up needed if patient is low-risk (and has no known or suspected primary neoplasm). Non-contrast chest CT can be considered in 12 months if patient is high-risk. This recommendation follows the consensus statement: Guidelines for Management of Incidental Pulmonary Nodules Detected on CT Images: From the Fleischner Society 2017; Radiology 2017; 284:228-243. 6. Aortic Atherosclerosis (ICD10-I70.0). Electronically Signed   By: Sebastian Ache M.D.   On: 02/19/2019 17:03   CT HEAD WO CONTRAST  Result Date: 02/19/2019 CLINICAL DATA:  Right-sided weakness EXAM: CT HEAD WITHOUT CONTRAST TECHNIQUE: Contiguous axial images were obtained from the base of the skull through the vertex without  intravenous contrast. COMPARISON:  November 26, 2018. FINDINGS: Brain: No evidence of acute territorial infarction, hemorrhage, hydrocephalus,extra-axial collection or mass lesion/mass effect. There is dilatation the ventricles and sulci consistent with age-related atrophy. Low-attenuation changes in the deep white matter consistent with small vessel ischemia. Again noted is extensive areas of encephalomalacia involving the bilateral posterior parietooccipital lobes with continued evolution from the prior exam. Vascular: No hyperdense vessel or unexpected calcification. Skull: The skull is intact. No fracture or focal lesion identified. Sinuses/Orbits: The visualized paranasal sinuses and mastoid  air cells are clear. The orbits and globes intact. Other: None IMPRESSION: Findings consistent with age related atrophy and chronic small vessel ischemia Continued evolution of encephalomalacia in the bilateral posterior parieto-occipital lobes. Electronically Signed   By: Prudencio Pair M.D.   On: 02/19/2019 15:47   CT Angio Neck W and/or Wo Contrast  Result Date: 02/19/2019 CLINICAL DATA:  Right upper and lower extremity weakness. EXAM: CT ANGIOGRAPHY HEAD AND NECK TECHNIQUE: Multidetector CT imaging of the head and neck was performed using the standard protocol during bolus administration of intravenous contrast. Multiplanar CT image reconstructions and MIPs were obtained to evaluate the vascular anatomy. Carotid stenosis measurements (when applicable) are obtained utilizing NASCET criteria, using the distal internal carotid diameter as the denominator. CONTRAST:  1mL OMNIPAQUE IOHEXOL 350 MG/ML SOLN COMPARISON:  None. FINDINGS: CTA NECK FINDINGS Aortic arch: Standard 3 vessel aortic arch with moderate atherosclerotic plaque. No significant arch vessel origin stenosis. Right carotid system: Patent with scattered mild, predominantly calcified plaque in the common carotid and proximal internal carotid arteries. No evidence of significant stenosis or dissection. Left carotid system: Patent with scattered calcified and soft plaque in the common carotid artery and moderate, predominantly calcified plaque at the carotid bifurcation and in the carotid bulb. No evidence of significant stenosis or dissection. Vertebral arteries: Patent with the right being mildly dominant. Calcified plaque at the vertebral artery origins results in severe stenosis on the right and mild stenosis on the left. Skeleton: Left canal wall up mastoidectomy. Congenital C2-3 fusion. Severe right facet arthrosis at C3-4. Other neck: No evidence of cervical lymphadenopathy or mass. Upper chest: Mild mosaic attenuation in the lung apices,  possibly air trapping. Scattered small nodules in both lung apices with the largest measuring 4 mm in the right upper lobe (series 5, image 180). Review of the MIP images confirms the above findings CTA HEAD FINDINGS Anterior circulation: The internal carotid arteries are patent from skull base to carotid termini with calcified plaque resulting in mild-to-moderate left greater than right cavernous and mild left paraclinoid stenoses. ACAs and MCAs are patent with moderate distal branch vessel irregular narrowing but no evidence of proximal branch occlusion or significant proximal stenosis. No aneurysm is identified. Posterior circulation: The intracranial vertebral arteries are widely patent to the basilar. Patent right AICA and bilateral SCA origins are visualized. PICAs and a left AICA are not clearly identified. The basilar artery is patent with a mild-to-moderate stenosis in its midportion. There is a medium-sized left posterior communicating artery. Both PCAs are attenuated with moderate to severe P1 and severe P2 stenoses bilaterally. No aneurysm is identified. Venous sinuses: As permitted by contrast timing, patent. Anatomic variants: None. Review of the MIP images confirms the above findings IMPRESSION: 1. No emergent large vessel occlusion. 2. Age advanced intracranial atherosclerosis including moderate to severe bilateral PCA stenoses in the setting of chronic bilateral PCA infarcts. 3. Mild-to-moderate basilar artery and bilateral intracranial ICA stenoses. 4. Severe right vertebral artery origin  stenosis. 5. Small biapical lung nodules measuring up to 4 mm. No follow-up needed if patient is low-risk (and has no known or suspected primary neoplasm). Non-contrast chest CT can be considered in 12 months if patient is high-risk. This recommendation follows the consensus statement: Guidelines for Management of Incidental Pulmonary Nodules Detected on CT Images: From the Fleischner Society 2017; Radiology 2017;  [id number]. 6. Aortic Atherosclerosis (ICD10-I70.0). Electronically Signed   By: Sebastian AcheAllen  Grady M.D.   On: 02/19/2019 17:03   DG Chest Portable 1 View  Result Date: 02/19/2019 CLINICAL DATA:  Altered mental status. Tracheostomy patient. History of hypertension, congestive heart failure, diabetes and COPD. EXAM: PORTABLE CHEST 1 VIEW COMPARISON:  Radiographs 10/19/2017 and 10/12/2017. FINDINGS: 1656 hours. Tracheostomy appears well positioned. The heart size and mediastinal contours are stable. There is interval improved aeration of the right lung base with probable mild chronic basilar scarring. No new airspace disease, edema, pleural effusion or pneumothorax. The bones appear unchanged. Telemetry leads overlie the chest. IMPRESSION: Interval improved aeration of the right lung base. No acute cardiopulmonary process. Electronically Signed   By: Carey BullocksWilliam  Veazey M.D.   On: 02/19/2019 17:13      Scheduled Meds: .  stroke: mapping our early stages of recovery book   Does not apply Once  . aspirin  300 mg Rectal Daily  . budesonide  0.5 mg Nebulization BID  . digoxin  0.25 mg Intravenous Daily  . enoxaparin (LOVENOX) injection  40 mg Subcutaneous Q24H  . insulin aspart  0-5 Units Subcutaneous QHS  . insulin aspart  0-9 Units Subcutaneous TID WC  . insulin glargine  10 Units Subcutaneous Daily  . ipratropium-albuterol  3 mL Nebulization Q4H   Continuous Infusions: . sodium chloride 75 mL/hr at 02/20/19 0925  . valproate sodium       LOS: 1 day      Time spent: 35 minutes   Noralee StainJennifer Blaire Hodsdon, DO [facility] 02/20/2019, 10:22 AM   Available via Epic secure chat 7am-7pm After these hours, please refer to coverage provider listed on [email or web address]

## 2019-02-20 NOTE — Evaluation (Signed)
Speech Language Pathology Evaluation Patient Details Name: Tyrone Cunningham MRN: [id number] DOB: Aug 10, 1955 Today's Date: 02/20/2019 Time: 3762-8315 SLP Time Calculation (min) (ACUTE ONLY): 14 min  Problem List:  Patient Active Problem List   Diagnosis Date Noted  . Stroke (cerebrum) ([place]) 02/19/2019  . HCAP (healthcare-associated pneumonia) 10/09/2017  . Hypokalemia 10/09/2017  . Abnormal liver function 10/09/2017  . Severe protein-calorie malnutrition ([place]) 10/09/2017  . Cellulitis and abscess of left leg 08/04/2017  . Diabetic foot ulcer ([place]) 08/04/2017  . Acute renal failure (ARF) ([place]) 08/04/2017  . SOB (shortness of breath) 09/13/2016  . Peripheral vascular disease ([place]) 08/19/2016  . Tracheostomy care ([place]) 06/28/2015  . Obstructive sleep apnea 06/28/2015  . Chronic pain syndrome 04/18/2015  . Constipation 01/07/2014  . Constipation, chronic 01/04/2014  . Chronic respiratory failure ([place]) 01/06/2013  . Edema 03/16/2012  . Obesity 10/20/2011  . DM (diabetes mellitus), type 2 with neurological complications ([place]) 17/61/6073  . Tracheostomy dependent ([place]) 10/16/2011  . [name] with frequent exacerbations   . CHF (congestive heart failure) ([place])   . Hypertension   . Hypercholesteremia   . Hearing loss   . Diabetic neuropathy [facility])    Past Medical History:  Past Medical History:  Diagnosis Date  . Bronchitis   . CHF (congestive heart failure) (HCC)    grade 1 diastolic with preserved EF in 2017  . COPD (chronic obstructive pulmonary disease) ([place])   . Diabetes mellitus without complication ([place])   . DM type 2 with diabetic peripheral neuropathy ([place])   . Hearing loss   . Hypercholesteremia   . Hypertension   . Peripheral neuropathy   . Sleep apnea    Severe sleep apnea requiring tracheostomy   Past Surgical History:  Past Surgical History:  Procedure Laterality Date  . APPENDECTOMY    . CHOLECYSTECTOMY N/A 01/05/2014   Procedure: LAPAROSCOPIC CHOLECYSTECTOMY;   Surgeon: Rolm Bookbinder, MD;  Location: [facility];  Service: General;  Laterality: N/A;  . FEMUR HARDWARE REMOVAL  09/01/2003   Removal of retained hardware, two interlocking distal femoral  . FEMUR SURGERY    . HAND SURGERY    . MULTIPLE EXTRACTIONS WITH ALVEOLOPLASTY N/A 05/11/2012   Procedure: MULTIPLE EXTRACTION WITH ALVEOLOPLASTY;  Surgeon: Gae Bon, DDS;  Location: [facility];  Service: Oral Surgery;  Laterality: N/A;  . TRACHEOSTOMY     HPI:  Tyrone Cunningham is a 63 y.o. male with medical history significant of CHF, diabetes, chronic pain syndrome, was brought to ED via EMS from [place] facility for evaluation of altered mental status. Per nursing home staff, pt appears to have right sided facial drooping, and right-sided weakness.  Last known normal was yesterday. He is trach dependent.  No previous ST notes in Epic.  Pt reported that he does not wear a PMV and usually finger occludes or mouths words to communicate.     Assessment / Plan / Recommendation Clinical Impression  Pt was seen for a cognitive-linguistic evaluation in the setting of a possible CVA.  He has a trach without a PMV at baseline and he reported that he communicates via finger occulusion or mouthing.  Pt was in a mitt on his L hand upon arrival secondary to combativeness, so he was unable to finger occlude at this time.  No family present to determine cognitive-linguistic baseline.  He appeared to be confused and he was agitated upon SLP arrival, but he answered questions given cues.  Pt presents with a cognitive-linguistic impairment with  deficits in expressive and receptive language.  Pt followed basic 1 step commands with 100% accuracy given extra time, but he exhibited difficulty following 2 step commands.  He answered yes/no questions with 4/8 (50%) accuracy.  Pt was unable to name two items in the room via mouthing and he declined to participate in further confrontation naming tasks or additional language  assessment at this time.  Cognition not formally assessed secondary to reduced participation and language deficits; however, observed reduced safety judgement and attention to task.  Suspect that additional cognitive deficits are present.  SLP will f/u for diagnostic treatment targeting cognitive-linguistic deficits per POC.      SLP Assessment  SLP Recommendation/Assessment: Patient needs continued Speech Lanaguage Pathology Services SLP Visit Diagnosis: Aphasia (R47.01);Cognitive communication deficit (R41.841)    Follow Up Recommendations  Skilled Nursing facility    Frequency and Duration min 2x/week  2 weeks      SLP Evaluation Cognition  Overall Cognitive Status: No family/caregiver present to determine baseline cognitive functioning Arousal/Alertness: Awake/alert Orientation Level: Oriented to person Attention: Sustained Sustained Attention: Impaired Sustained Attention Impairment: Verbal basic Awareness: Impaired Awareness Impairment: Intellectual impairment Behaviors: Impulsive;Physical agitation;Restless Safety/Judgment: Impaired       Comprehension  Auditory Comprehension Overall Auditory Comprehension: Impaired Yes/No Questions: Impaired Basic Biographical Questions: 76-100% accurate Basic Immediate Environment Questions: 50-74% accurate Complex Questions: 25-49% accurate Commands: Impaired One Step Basic Commands: 75-100% accurate Two Step Basic Commands: 50-74% accurate Conversation: Simple Interfering Components: Attention    Expression Expression Primary Mode of Expression: Verbal(finger occlusion or mouthing (trach dependent) ) Verbal Expression Overall Verbal Expression: Impaired Naming: Impairment Confrontation: Impaired Convergent: 50-74% accurate Pragmatics: Impairment Impairments: Eye contact Interfering Components: Attention;Tracheostomy without PMSV Non-Verbal Means of Communication: Gestures   Oral / Motor  Oral Motor/Sensory  Function Overall Oral Motor/Sensory Function: Moderate impairment Facial ROM: Reduced right Facial Symmetry: Abnormal symmetry right Lingual Symmetry: Abnormal symmetry right Motor Speech Overall Motor Speech: (Unable to evaluate )   GO                   Villa Herb., M.S., CCC-SLP Acute Rehabilitation Services Office: [phone]  Shanon Rosser Iyan Flett 02/20/2019, 10:09 AM

## 2019-02-20 NOTE — Evaluation (Signed)
Physical Therapy Evaluation Patient Details Name: Tyrone Cunningham MRN: [id number] DOB: April 14, 1955 Today's Date: 02/20/2019   History of Present Illness  Tyrone Cunningham is a 63 y.o. male with PMHx: chronic CHF, diabetes, chronic pain syndrome, chronic trach, was brought to ED via EMS from [facility] for AMS with R sided facial drooping, R-sided weakness. Pt is usually bedbound but can walk on occasion; Pt is trach dependent. 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 Incidental lung nodule found.    Clinical Impression  Pt presents with profound limitations to mobility due to weakness, decr motor control, obesity, chronic respiratory issues, resulting in inability to change positions unassisted or ambulate. Recommend return to SNF for postacute rehab to address deficits with goal of maximizing independence and quality of life.  PT will initiate care in acute setting.    Follow Up Recommendations SNF    Equipment Recommendations  None recommended by PT    Recommendations for Other Services       Precautions / Restrictions Precautions Precautions: Fall;Other (comment) Precaution Comments: trach dependent Restrictions Weight Bearing Restrictions: No      Mobility  Bed Mobility Overal bed mobility: Needs Assistance Bed Mobility: Rolling;Sidelying to Sit;Sit to Sidelying Rolling: Total assist;Max assist;+2 for physical assistance Sidelying to sit: +2 for physical assistance;+2 for safety/equipment;Max assist   Sit to supine: Max assist;+2 for physical assistance;+2 for safety/equipment Sit to sidelying: Total assist;+2 for physical assistance;+2 for safety/equipment General bed mobility comments: pt able to initiate and manage L side but due to body habitus and hemiparesis R side, unable to initate to roll L  Transfers                 General transfer comment: deferred, pt will need lift for safety  Ambulation/Gait              General Gait Details: unable to ambulate  Stairs            Wheelchair Mobility    Modified Rankin (Stroke Patients Only) Modified Rankin (Stroke Patients Only) Modified Rankin: Severe disability     Balance Overall balance assessment: Needs assistance Sitting-balance support: Feet unsupported;Single extremity supported Sitting balance-Leahy Scale: Poor Sitting balance - Comments: leans with cues, but lists to Right and falls if unasssited Postural control: Right lateral lean                                   Pertinent Vitals/Pain Pain Assessment: Faces Faces Pain Scale: Hurts a little bit Pain Location: repositioning hemiparetic RLE Pain Descriptors / Indicators: Grimacing Pain Intervention(s): Limited activity within patient's tolerance;Repositioned    Home Living Family/patient expects to be discharged to:: Skilled nursing facility     Type of Home: [place]           Additional Comments: Pt speaking about his brother and hoping that he knew his condition. RN had spoken with pt's brother earlier this AM.    Prior Function Level of Independence: Needs assistance   Gait / Transfers Assistance Needed: Per chart, pt was ambulatory at times, but remained mostly bed bound  ADL's / Homemaking Assistance Needed: Pt required assist with ADL        Hand Dominance   Dominant Hand: Left    Extremity/Trunk Assessment   Upper Extremity Assessment Upper Extremity Assessment: Defer to OT evaluation RUE Deficits / Details: No AROM noted,  pt reports to feel sensation RUE Sensation: WNL RUE Coordination: decreased fine motor;decreased gross motor LUE Deficits / Details: RUE, AROM is WFLs; 4-/5 MM grade LUE Coordination: WNL    Lower Extremity Assessment Lower Extremity Assessment: RLE deficits/detail RLE Deficits / Details: lacks motor activity, unable to self-assist repositioning, stiff RLE Sensation: WNL RLE Coordination:  decreased gross motor    Cervical / Trunk Assessment Cervical / Trunk Assessment: Kyphotic  Communication   Communication: Tracheostomy  Cognition Arousal/Alertness: Lethargic(arouses with aural stim, needs repeated stim but mostly aler) Behavior During Therapy: WFL for tasks assessed/performed Overall Cognitive Status: Within Functional Limits for tasks assessed                                 General Comments: see OT comments      General Comments General comments (skin integrity, edema, etc.): Low bp as noted in OT note, but unsure if reliable as pt mostly asymptomatic and cuff fit for forearm    Exercises     Assessment/Plan    PT Assessment Patient needs continued PT services  PT Problem List Obesity;Decreased coordination;Decreased mobility;Decreased balance;Decreased activity tolerance;Decreased range of motion;Decreased strength       PT Treatment Interventions Patient/family education;Neuromuscular re-education;Balance training;Therapeutic exercise;Therapeutic activities;Functional mobility training    PT Goals (Current goals can be found in the Care Plan section)  Acute Rehab PT Goals Patient Stated Goal: to get stronger on R side PT Goal Formulation: With patient Time For Goal Achievement: 03/06/19 Potential to Achieve Goals: Fair    Frequency Min 2X/week   Barriers to discharge   back to snf    Co-evaluation PT/OT/SLP Co-Evaluation/Treatment: Yes Reason for Co-Treatment: Complexity of the patient's impairments (multi-system involvement);For patient/therapist safety PT goals addressed during session: Mobility/safety with mobility OT goals addressed during session: Strengthening/ROM       AM-PAC PT "6 Clicks" Mobility  Outcome Measure Help needed turning from your back to your side while in a flat bed without using bedrails?: A Lot Help needed moving from lying on your back to sitting on the side of a flat bed without using bedrails?:  Total Help needed moving to and from a bed to a chair (including a wheelchair)?: Total Help needed standing up from a chair using your arms (e.g., wheelchair or bedside chair)?: Total Help needed to walk in hospital room?: Total Help needed climbing 3-5 steps with a railing? : Total 6 Click Score: 7    End of Session Equipment Utilized During Treatment: Oxygen(trach collar) Activity Tolerance: Patient limited by fatigue;Patient limited by lethargy Patient left: in bed;with call bell/phone within reach;with nursing/sitter in room Nurse Communication: Mobility status PT Visit Diagnosis: Hemiplegia and hemiparesis;Muscle weakness (generalized) (M62.81);Other abnormalities of gait and mobility (R26.89) Hemiplegia - Right/Left: Right Hemiplegia - dominant/non-dominant: Non-dominant    Time: 5366-4403 PT Time Calculation (min) (ACUTE ONLY): 22 min   Charges:   PT Evaluation $PT Eval Moderate Complexity: 1 Mod          Narda Amber, PT, DPT, MS Board Certified Geriatric Clinical Specialist   Dennis Bast 02/20/2019, 4:42 PM

## 2019-02-20 NOTE — Evaluation (Signed)
Clinical/Bedside Swallow Evaluation Patient Details  Name: Tyrone Cunningham MRN: [id number] Date of Birth: 12-03-1955  Today's Date: 02/20/2019 Time: SLP Start Time (ACUTE ONLY): 4098 SLP Stop Time (ACUTE ONLY): 0920 SLP Time Calculation (min) (ACUTE ONLY): 15 min  Past Medical History:  Past Medical History:  Diagnosis Date  . Bronchitis   . CHF (congestive heart failure) (HCC)    grade 1 diastolic with preserved EF in 2017  . COPD (chronic obstructive pulmonary disease) ([place])   . Diabetes mellitus without complication ([place])   . DM type 2 with diabetic peripheral neuropathy ([place])   . Hearing loss   . Hypercholesteremia   . Hypertension   . Peripheral neuropathy   . Sleep apnea    Severe sleep apnea requiring tracheostomy   Past Surgical History:  Past Surgical History:  Procedure Laterality Date  . APPENDECTOMY    . CHOLECYSTECTOMY N/A 01/05/2014   Procedure: LAPAROSCOPIC CHOLECYSTECTOMY;  Surgeon: Rolm Bookbinder, MD;  Location: [facility];  Service: General;  Laterality: N/A;  . FEMUR HARDWARE REMOVAL  09/01/2003   Removal of retained hardware, two interlocking distal femoral  . FEMUR SURGERY    . HAND SURGERY    . MULTIPLE EXTRACTIONS WITH ALVEOLOPLASTY N/A 05/11/2012   Procedure: MULTIPLE EXTRACTION WITH ALVEOLOPLASTY;  Surgeon: Gae Bon, DDS;  Location: [place];  Service: Oral Surgery;  Laterality: N/A;  . TRACHEOSTOMY     HPI:  Tyrone Cunningham is a 63 y.o. male with medical history significant of CHF, diabetes, chronic pain syndrome, was brought to ED via EMS from [place] facility for evaluation of altered mental status. Per nursing home staff, pt appears to have right sided facial drooping, and right-sided weakness.  Last known normal was yesterday. He is trach dependent.  No previous ST notes in Epic.  Pt reported that he does not wear a PMV and usually finger occludes or mouths words to communicate.     Assessment / Plan / Recommendation Clinical Impression  Pt  presents with oral dysphagia and suspected pharyngeal dysphagia.  Per yes/no questioning, pt reported that he consumes thin liquid and regular solids at baseline; however, unable to determine validity secondary to cognitive-linguistic deficits (see cog/ling evaluation note).  Pt was awake/alert, but appeared confused and was swinging at SLP with left hand (in mitt) when SLP approached pt's bed.  Oral mech exam was remarkable for a R facial droop and significant lingual deviation to the R upon protrusion.  Attempted oral care prior to po trials; however, pt refused via turning head away and swatting at swab.  Pt was agreeable to a few ice chip and teaspoons of thin liquid.  He exhibited reduced labial closure around the spoon and reduced lingual manipulation of all boluses.  AP transport was prolonged and suspect that pt may not have consistently initiated a swallow with all trials.  Pt with immediate coughing following 1/4 thin liquid trials.  Recommend a speaking valve evaluation and an instrumental swallow study to further evaluate swallow function prior to diet initiation.  Spoke with RN regarding all recommendations.   SLP Visit Diagnosis: Dysphagia, unspecified (R13.10)    Aspiration Risk  Moderate aspiration risk    Diet Recommendation NPO   Medication Administration: Via alternative means    Other  Recommendations Oral Care Recommendations: Oral care QID;Staff/trained caregiver to provide oral care   Follow up Recommendations Skilled Nursing facility      Frequency and Duration min 2x/week  2 weeks  Prognosis Prognosis for Safe Diet Advancement: Fair Barriers to Reach Goals: Cognitive deficits;Language deficits;Behavior      Swallow Study   General Date of Onset: 02/20/19 HPI: Tyrone Cunningham is a 63 y.o. male with medical history significant of CHF, diabetes, chronic pain syndrome, was brought to ED via EMS from [facility] nursing facility for evaluation of altered mental status.  Per nursing home staff, pt appears to have right sided facial drooping, and right-sided weakness.  Last known normal was yesterday. He is trach dependent.  No previous ST notes in Epic.  Pt reported that he does not wear a PMV and usually finger occludes or mouths words to communicate.   Type of Study: Bedside Swallow Evaluation Previous Swallow Assessment: None documented in Epic  Diet Prior to this Study: NPO Temperature Spikes Noted: Yes Respiratory Status: Trach Collar History of Recent Intubation: No Behavior/Cognition: Agitated;Impulsive;Requires cueing;Doesn't follow directions;Distractible;Alert;Uncooperative;Confused Oral Cavity Assessment: (Pt refused ) Oral Care Completed by SLP: No(Pt refused ) Oral Cavity - Dentition: (Unable to evaluate ) Self-Feeding Abilities: Needs assist Patient Positioning: Upright in bed Baseline Vocal Quality: Aphonic Volitional Swallow: Unable to elicit    Oral/Motor/Sensory Function Overall Oral Motor/Sensory Function: Moderate impairment Facial ROM: Reduced right Facial Symmetry: Abnormal symmetry right Lingual Symmetry: Abnormal symmetry right   Ice Chips Ice chips: Impaired Presentation: Spoon Oral Phase Impairments: Reduced labial seal;Reduced lingual movement/coordination;Impaired mastication Oral Phase Functional Implications: Prolonged oral transit;Oral residue;Oral holding Pharyngeal Phase Impairments: Unable to trigger swallow;Suspected delayed Swallow   Thin Liquid Thin Liquid: Impaired Presentation: Spoon Oral Phase Impairments: Reduced labial seal;Reduced lingual movement/coordination Oral Phase Functional Implications: Prolonged oral transit;Oral residue Pharyngeal  Phase Impairments: Suspected delayed Swallow;Cough - Immediate    Nectar Thick Nectar Thick Liquid: Not tested   Honey Thick Honey Thick Liquid: Not tested   Puree Puree: Not tested   Solid     Solid: Not tested      Tyrone Cunningham 02/20/2019,9:55 AM

## 2019-02-20 NOTE — Progress Notes (Signed)
STROKE TEAM PROGRESS NOTE   HISTORY OF PRESENT ILLNESS (per record) Tyrone Cunningham is an 63 y.o. male with history of CHF, diabetes, chronic pain syndrome and trach dependent brought here via EMS from [facility]. He was brought in for altered mental status. According to ED notes the nursing home last saw him well yesterday and noticed a change this morning. The nursing home said he does not normally have any weakness, he is mostly bed bound, but is able to ambulate. The patient himself states he is able to walk and able to see normally.  The emergency team notified Dr. Laurence Slate that the patient was VAN positive. The patient had right facial droop, right side weakness and visual field changes. Head CT showed no acute changes and CTA showed no LVO. He was outside of the tPA window and not a candidate for thrombectomy because he did not have a large vessel occlusion.  Date last known well: Date: 02/18/2019 Time last known well: Unable to determine tPA Given: No: outside tPA Window No Symptoms                                                                                                 0 No significant disability/able to carry out all usual activities                           1 Unable to carry out all previous activities but looks after own affairs             2 Requires help but walks without assistance                                                   3 Unable to walk without assistance/unable to handle own bodily needs        4 Bedridden/incontinent                                                                                     5 Dead  6  Modified Rankin: Rankin Score=2   INTERVAL HISTORY  I personally reviewed history of presenting illness, electronic medical records and imaging films in PACS. He presented with altered mental status and was found subsequently to have a right hemiplegia  likely from left subcortical infarct MRI brain imaging is pending and CT angiogram shows advanced atherosclerotic changes mostly in the posterior circulation.   OBJECTIVE Vitals:   02/20/19 0706 02/20/19 0720 02/20/19 0751 02/20/19 0837  BP:   135/74   Pulse: (!) 112 (!) 106 (!) 106   Resp: (!) 21 (!) 22 (!) 25   Temp:   98.6 F (37 C)   TempSrc:   Oral   SpO2: 96% 96% 96% 96%  Weight:      Height:        CBC:  Recent Labs  Lab 02/19/19 1522 02/19/19 1527 02/20/19 0952  WBC 15.7*  --  14.0*  NEUTROABS 12.2*  --   --   HGB 14.6 15.0 13.4  HCT 44.8 44.0 40.0  MCV 92.0  --  90.7  PLT 227  --  224    Basic Metabolic Panel:  Recent Labs  Lab 02/19/19 1522 02/19/19 1527 02/20/19 0952  NA 137 136 139  K 5.1 4.9 4.0  CL 99 102 104  CO2 27  --  26  GLUCOSE 205* 198* 131*  BUN 33* 35* 29*  CREATININE 1.38* 1.10 1.07  CALCIUM 9.3  --  8.9    Lipid Panel:     Component Value Date/Time   CHOL 128 02/20/2019 0522   CHOL 171 05/26/2017 1522   TRIG 221 (H) 02/20/2019 0522   HDL 27 (L) 02/20/2019 0522   HDL 47 05/26/2017 1522   CHOLHDL 4.7 02/20/2019 0522   VLDL 44 (H) 02/20/2019 0522   LDLCALC 57 02/20/2019 0522   LDLCALC 99 05/26/2017 1522   HgbA1c:  Lab Results  Component Value Date   HGBA1C 8.3 (H) 02/20/2019   Urine Drug Screen:     Component Value Date/Time   LABOPIA NONE DETECTED 02/19/2019 2050   COCAINSCRNUR NONE DETECTED 02/19/2019 2050   LABBENZ NONE DETECTED 02/19/2019 2050   AMPHETMU NONE DETECTED 02/19/2019 2050   THCU NONE DETECTED 02/19/2019 2050   LABBARB NONE DETECTED 02/19/2019 2050    Alcohol Level     Component Value Date/Time   ETH <10 02/19/2019 2354    IMAGING  CT Angio Head W or Wo Contrast  CT Angio Neck W and/or Wo Contrast 02/19/2019 IMPRESSION:  1. No emergent large vessel occlusion.  2. Age advanced intracranial atherosclerosis including moderate to severe bilateral PCA stenoses in the setting of chronic bilateral  PCA infarcts.  3. Mild-to-moderate basilar artery and bilateral intracranial ICA stenoses.  4. Severe right vertebral artery origin stenosis.  5. Small biapical lung nodules measuring up to 4 mm. No follow-up needed if patient is low-risk (and has no known or suspected primary neoplasm). Non-contrast chest CT can be considered in 12 months if patient is high-risk. This recommendation follows the consensus statement: Guidelines for Management of Incidental Pulmonary Nodules Detected on CT Images: From the Fleischner Society 2017; Radiology 2017; 284:228-243.  6. Aortic Atherosclerosis (ICD10-I70.0).  CT HEAD WO CONTRAST 02/19/2019 IMPRESSION:  Findings consistent with age related atrophy and chronic small vessel ischemia Continued evolution of encephalomalacia in the bilateral posterior parieto-occipital lobes.   MRI Brain W&Wo Contrast - pending 02/20/19  DG Chest Portable 1 View 02/19/2019 IMPRESSION:  Interval improved aeration of the right lung  base. No acute cardiopulmonary process.   Transthoracic Echocardiogram  00/00/2020 Pending   ECG - SR rate 73 BPM. (See cardiology reading for complete details)   PHYSICAL EXAM Blood pressure 135/74, pulse (!) 106, temperature 98.6 F (37 C), temperature source Oral, resp. rate (!) 25, height 5\' 7"  (1.702 m), weight 122.5 kg, SpO2 96 %. Patient has a tracheostomy and is on trach collar oxygen.  Is in mild respiratory distress.  But oxygen sats are maintained Neurological Exam : Awake alert unable to test orientation questions due to tracheostomy.  Follows simple midline and 1 and two-step commands.  Extraocular movements are full range without nystagmus.  Blinks to threat on the left well but not as well on the right.  Right lower facial weakness.  Tongue deviates to the right.  Dense right hemiplegia with hypotonia and flaccidity.  Trace withdrawal in the right lower extremity to pain but not in the upper extremity.  Able to move left upper  and lower extremities purposefully against gravity without weakness.  Sensation appears diminished on the right compared to the left.  Right plantar is upgoing left is downgoing.  Gait not tested.      Mr. Tyrone Cunningham is a 63 y.o. male with history of CHF, diabetes, COPD, Hld, Htn, hearing loss, peripheral neuropathy, chronic pain syndrome and trach dependent [facility] altered [facility] status, right facial droop, right side weakness and visual field changes. 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   MRI head - pending  MRA head - not ordered  CTA H&N - No emergent large vessel occlusion. Age advanced intracranial atherosclerosis including moderate to severe bilateral PCA stenoses in the setting of chronic bilateral PCA infarcts. Mild-to-moderate basilar artery and bilateral intracranial ICA stenoses. Severe right vertebral artery origin stenosis.   CT Perfusion - not ordered  Carotid Doppler - CTA neck performed - carotid dopplers not indicated.  2D Echo - pending  Loyal JacobsonSars Corona Virus 2  - negative  LDL - 57  HgbA1c - 8.3  UDS - negative  VTE prophylaxis - Lovenox Diet  Diet Order            Diet NPO time specified  Diet effective now              No antithrombotic prior to admission, now on aspirin 300 mg suppository daily but switch to aspirin 81 and Plavix 75 mg daily for 3 months when able to swallow followed by aspirin alone  Patient counseled to be compliant with his antithrombotic medications  Ongoing aggressive stroke risk factor  management  Therapy recommendations:  pending  Disposition:  Pending  Hypertension  Home BP meds: Zestril , metoprolol  Current BP meds: none   Stable . Permissive hypertension (OK if < 220/120) but gradually normalize in 5-7 days  . Long-term BP goal normotensive  Hyperlipidemia  Home Lipid lowering medication: none   LDL 57, goal < 70  Current lipid lowering medication: none   Continue statin at discharge  Diabetes  Home diabetic meds: Insulin  Current diabetic meds: Insulin  HgbA1c 8.3, goal < 7.0  Recent Labs    02/19/19 2132 02/20/19 0554 02/20/19 1119  GLUCAP 99 135* 122*    Other Stroke Risk Factors  Advanced age  Former cigarette smoker - quit  Obesity, Body mass index is 42.29 kg/m., recommend weight loss, diet and exercise as appropriate   Hx stroke/TIA - by imaging  Congestive Heart Failure  OSA -> tracheostomy  Other Active Problems  Small biapical lung nodules measuring up to 4 mm. No follow-up needed if patient is low-risk (and has no known or suspected primary neoplasm). Non-contrast chest CT can be considered in 12 months if patient is high-risk.  Leukocytosis - 15.7->14.0 (afebrile)  Low grade tachycardia   Hospital day # 1 I have personally obtained history,examined this patient, reviewed notes, independently viewed imaging studies, participated in medical decision making and plan of care.ROS completed by me personally and pertinent positives fully documented  I have made any additions or clarifications directly to the above note.  He presented with right hemiplegia and visual deficits likely due to left hemispheric infarct likely from underlying advanced intracranial atherosclerotic changes.  Recommend dual antiplatelet therapy aspirin and Plavix for 3 months followed by aspirin alone check MRI scan of the brain, echocardiogram and aggressive risk factor modification.  Therapy consults.  Greater than 50% time during this 35-minute  visit was spent on counseling and coordination of care about his stroke and discussion with care team.  Discussed with Dr. Melvenia Needles, [place] Pager: [phone] 02/20/2019 2:17 PM   To contact Stroke Continuity provider, please refer to [email or web address] After hours, contact General Neurology

## 2019-02-20 NOTE — Progress Notes (Signed)
Pt HR sustaining in 120s. MD Maylene Roes has been notified. RN waiting on orders.

## 2019-02-21 ENCOUNTER — Inpatient Hospital Stay (HOSPITAL_COMMUNITY): Payer: Medicare Other

## 2019-02-21 LAB — BASIC METABOLIC PANEL
Anion gap: 12 (ref 5–15)
BUN: 22 mg/dL (ref 8–23)
CO2: 25 mmol/L (ref 22–32)
Calcium: 8.8 mg/dL — ABNORMAL LOW (ref 8.9–10.3)
Chloride: 105 mmol/L (ref 98–111)
Creatinine, Ser: 0.96 mg/dL (ref 0.61–1.24)
GFR calc Af Amer: >60 mL/min (ref >60)
GFR calc non Af Amer: >60 mL/min (ref >60)
Glucose, Bld: 128 mg/dL — ABNORMAL HIGH (ref 70–99)
Potassium: 4.1 mmol/L (ref 3.5–5.1)
Sodium: 142 mmol/L (ref 135–145)

## 2019-02-21 LAB — CBC
HCT: 39.5 % (ref 39.0–52.0)
Hemoglobin: 13.3 g/dL (ref 13.0–17.0)
MCH: 30.1 pg (ref 26.0–34.0)
MCHC: 33.7 g/dL (ref 30.0–36.0)
MCV: 89.4 fL (ref 80.0–100.0)
Platelets: 239 10*3/uL (ref 150–400)
RBC: 4.42 MIL/uL (ref 4.22–5.81)
RDW: 13.3 % (ref 11.5–15.5)
WBC: 19.7 10*3/uL — ABNORMAL HIGH (ref 4.0–10.5)
nRBC: 0 % (ref 0.0–0.2)

## 2019-02-21 LAB — GLUCOSE, CAPILLARY
Glucose-Capillary: 138 mg/dL — ABNORMAL HIGH (ref 70–99)
Glucose-Capillary: 116 mg/dL — ABNORMAL HIGH (ref 70–99)
Glucose-Capillary: 128 mg/dL — ABNORMAL HIGH (ref 70–99)
Glucose-Capillary: 132 mg/dL — ABNORMAL HIGH (ref 70–99)

## 2019-02-21 MED ORDER — METOPROLOL TARTRATE 5 MG/5ML IV SOLN
10.0000 mg | Freq: Four times a day (QID) | INTRAVENOUS | Status: DC
  Administered 2019-02-21 – 2019-02-23 (×9): 10 mg via INTRAVENOUS
  Filled 2019-02-21 (×9): qty 10

## 2019-02-21 MED ORDER — SODIUM CHLORIDE 0.9 % IV SOLN: INTRAVENOUS | Status: DC

## 2019-02-21 MED ORDER — PIPERACILLIN-TAZOBACTAM 3.375 G IVPB
3.3750 g | Freq: Three times a day (TID) | INTRAVENOUS | Status: DC
  Administered 2019-02-21 – 2019-02-22 (×3): 3.375 g via INTRAVENOUS
  Filled 2019-02-21 (×5): qty 50

## 2019-02-21 MED ORDER — PIPERACILLIN-TAZOBACTAM 3.375 G IVPB 30 MIN: 3.3750 g | Freq: Three times a day (TID) | INTRAVENOUS | Status: DC

## 2019-02-21 MED ORDER — VANCOMYCIN HCL IN DEXTROSE 1-5 GM/200ML-% IV SOLN
1000.0000 mg | Freq: Two times a day (BID) | INTRAVENOUS | Status: DC
  Administered 2019-02-22 – 2019-02-25 (×7): 1000 mg via INTRAVENOUS
  Filled 2019-02-21 (×8): qty 200

## 2019-02-21 MED ORDER — VANCOMYCIN HCL 2000 MG/400ML IV SOLN
2000.0000 mg | Freq: Once | INTRAVENOUS | Status: AC
  Administered 2019-02-21: 2000 mg via INTRAVENOUS
  Filled 2019-02-21: qty 400

## 2019-02-21 NOTE — Progress Notes (Addendum)
PROGRESS NOTE    Tyrone BaileyMark Cunningham  NGE:[id number]:[id number] DOB: 09/15/1955 DOA: 02/19/2019 PCP: System, Pcp Not In     Brief Narrative:  Tyrone Cunningham is a 63 y.o. male with past medical history significant of chronic CHF, diabetes, chronic pain syndrome, chronic trach, was [facility] ED [facility] for evaluation of altered mental status.Per nursing home staff, pt appears to have right sided facial drooping, right-sided weakness, and not behaving himself this mornnig. Last known normal was day before admission. He is usually mostly bedbound but can walk on occasion. He is trach dependent. In the ED, head CT showed no acute changes and CTA showed no LVO. He was outside of the tPA window and not a candidate for thrombectomy because he did not have a large vessel occlusion.Neurology consulted.   New events last 24 hours / Subjective: Very difficult to understand patient today, he is able to follow some simple commands.  Remains hemiplegic on the right.  Assessment & Plan:   Active Problems:   Stroke (cerebrum) (HCC)   Right sided hemiplegia, right facial droop  -CT head: Findings consistent with age related atrophy and chronic small vessel ischemia. Continued evolution of encephalomalacia in the bilateral posterior parieto-occipital lobes. -CTA head and neck: No emergent large vessel occlusion. Age advanced intracranial atherosclerosis including moderate to severe bilateral PCA stenoses in the setting of chronic bilateral PCA infarcts. -Not a candidate for TPA, thrombectomy  -Repeat CT head: Left occipital hypodensity appears ill-defined and has progressed since 11/26/2018. No change from 02/19/2019. Findings compatible with subacute extension of left PCA infarct. Chronic infarct right PCA territory unchanged. Chronic left PICA infarct unchanged. Negative for acute hemorrhage. -Echo EF 55-60%. Contrast shows normal EF without significant wall motion abnormalities. Limited  ability to assess any other features due to body habitus and limited echo windows -PT OT SLP.  Remains n.p.o. for now per SLP eval  -Neurology following  -Aspirin   SIRS, not POA -Remains tachycardic with increasing WBC.  Afebrile.  He does have rhonchorous breath sounds on examination today.  Check chest x-ray, blood cultures  Essential hypertension -Hold lisinopril to allow for permissive hypertension -Continue lopressor due to tachycardia   Chronic diastolic heart failure -Digoxin  Type 2 diabetes, insulin-dependent, poorly controlled -Ha1c 8.3 -Lantus, sliding scale insulin  Hyperlipidemia -Resume statin when able to take p.o.  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 No follow-up needed if patient is low-risk (and has no known or suspected primary neoplasm). Non-contrast chest CT can be considered in 12 months if patient is high-risk.  -Outpatient follow up   Unclear why he is on depakote and seroquel. Changed depakote to IV while NPO. Hold seroquel for now due to NPO.    DVT prophylaxis: Lovenox  Code Status: Full Family Communication: None at bedside; left voicemail for brother to call us back with an update Disposition Plan: Pending neurology work up, clinical improvement.  Plan for return back to SNF once stable   Consultants:   Neurology  Procedures:   None   Antimicrobials:  Anti-infectives (From admission, onward)   None       Objective: Vitals:   02/21/19 0344 02/21/19 0433 02/21/19 0444 02/21/19 0813  BP: (!) 144/71   (!) 162/87  Pulse: (!) 106 (!) 104 (!) 107 (!) 114  Resp: 17 14 20  12  Temp: 98.2 F (36.8 C)   98.3 F (36.8 C)  TempSrc: Oral   Oral  SpO2: 94% 94% 94% 93%  Weight:      Height:        Intake/Output Summary (Last 24 hours) at 02/21/2019 7829 Last data filed at  02/21/2019 0349 Gross per 24 hour  Intake 110 ml  Output 720 ml  Net -610 ml   Filed Weights   02/19/19 1519  Weight: 122.5 kg    Examination: General exam: Appears somewhat agitated Respiratory system: Bilateral rhonchi, on trach collar Cardiovascular system: S1 & S2 heard, tachycardic, regular rhythm. No pedal edema. Gastrointestinal system: Abdomen is nondistended, soft and nontender. Normal bowel sounds heard. Central nervous system: Alert, very difficult to understand patient today.  Remains hemiplegic on the right with tongue deviation to the right Extremities: Symmetric in appearance bilaterally  Skin: No rashes, lesions or ulcers on exposed skin     Data Reviewed: I have personally reviewed following labs and imaging studies  CBC: Recent Labs  Lab 02/19/19 1522 02/19/19 1527 02/20/19 0952 02/21/19 0407  WBC 15.7*  --  14.0* 19.7*  NEUTROABS 12.2*  --   --   --   HGB 14.6 15.0 13.4 13.3  HCT 44.8 44.0 40.0 39.5  MCV 92.0  --  90.7 89.4  PLT 227  --  224 239   Basic Metabolic Panel: Recent Labs  Lab 02/19/19 1522 02/19/19 1527 02/20/19 0952 02/21/19 0407  NA 137 136 139 142  K 5.1 4.9 4.0 4.1  CL 99 102 104 105  CO2 27  --  26 25  GLUCOSE 205* 198* 131* 128*  BUN 33* 35* 29* 22  CREATININE 1.38* 1.10 1.07 0.96  CALCIUM 9.3  --  8.9 8.8*   GFR: Estimated Creatinine Clearance: 96.2 mL/min (by C-G formula based on SCr of 0.96 mg/dL). Liver Function Tests: Recent Labs  Lab 02/19/19 1522  AST 14*  ALT 17  ALKPHOS 82  BILITOT 0.3  PROT 7.0  ALBUMIN 3.0*   No results for input(s): LIPASE, AMYLASE in the last 168 hours. No results for input(s): AMMONIA in the last 168 hours. Coagulation Profile: Recent Labs  Lab 02/19/19 1522  INR 1.0   Cardiac Enzymes: No results for input(s): CKTOTAL, CKMB, CKMBINDEX, TROPONINI in the last 168 hours. BNP (last 3 results) No results for input(s): PROBNP in the last 8760 hours. HbA1C: Recent Labs     02/20/19 0522  HGBA1C 8.3*   CBG: Recent Labs  Lab 02/20/19 0554 02/20/19 1119 02/20/19 1620 02/20/19 2117 02/21/19 0601  GLUCAP 135* 122* 103* 115* 138*   Lipid Profile: Recent Labs    02/20/19 0522  CHOL 128  HDL 27*  LDLCALC 57  TRIG 562*  CHOLHDL 4.7   Thyroid Function Tests: No results for input(s): TSH, T4TOTAL, FREET4, T3FREE, THYROIDAB in the last 72 hours. Anemia Panel: No results for input(s): VITAMINB12, FOLATE, FERRITIN, TIBC, IRON, RETICCTPCT in the last 72 hours. Sepsis Labs: No results for input(s): PROCALCITON, LATICACIDVEN in the last 168 hours.  Recent Results (from the past 240 hour(s))  SARS CORONAVIRUS 2 (TAT 6-24 HRS) Nasopharyngeal Nasopharyngeal Swab     Status: None   Collection Time: 02/19/19  3:44 PM   Specimen: Nasopharyngeal Swab  Result Value Ref Range Status   SARS Coronavirus 2 NEGATIVE NEGATIVE Final    Comment: (NOTE) SARS-CoV-2 target nucleic acids are NOT DETECTED. The SARS-CoV-2 RNA is generally detectable in upper and lower respiratory specimens during  the acute phase of infection. Negative results do not preclude SARS-CoV-2 infection, do not rule out co-infections with other pathogens, and should not be used as the sole basis for treatment or other patient management decisions. Negative results must be combined with clinical observations, patient history, and epidemiological information. The expected result is Negative. Fact Sheet for Patients: HairSlick.no Fact Sheet for Healthcare Providers: quierodirigir.com This test is not yet approved or cleared by the [place] FDA and  has been authorized for detection and/or diagnosis of SARS-CoV-2 by FDA under an Emergency Use Authorization (EUA). This EUA will remain  in effect (meaning this test can be used) for the duration of the COVID-19 declaration under Section 56 4(b)(1) of the Act, 21 U.S.C. section 360bbb-3(b)(1),  unless the authorization is terminated or revoked sooner. Performed at [facility], [address], [place], [place] [address]       Radiology Studies: CT Angio Head W or Wo Contrast  Result Date: 02/19/2019 CLINICAL DATA:  Right upper and lower extremity weakness. EXAM: CT ANGIOGRAPHY HEAD AND NECK TECHNIQUE: Multidetector CT imaging of the head and neck was performed using the standard protocol during bolus administration of intravenous contrast. Multiplanar CT image reconstructions and MIPs were obtained to evaluate the vascular anatomy. Carotid stenosis measurements (when applicable) are obtained utilizing NASCET criteria, using the distal internal carotid diameter as the denominator. CONTRAST:  60mL OMNIPAQUE IOHEXOL 350 MG/ML SOLN COMPARISON:  None. FINDINGS: CTA NECK FINDINGS Aortic arch: Standard 3 vessel aortic arch with moderate atherosclerotic plaque. No significant arch vessel origin stenosis. Right carotid system: Patent with scattered mild, predominantly calcified plaque in the common carotid and proximal internal carotid arteries. No evidence of significant stenosis or dissection. Left carotid system: Patent with scattered calcified and soft plaque in the common carotid artery and moderate, predominantly calcified plaque at the carotid bifurcation and in the carotid bulb. No evidence of significant stenosis or dissection. Vertebral arteries: Patent with the right being mildly dominant. Calcified plaque at the vertebral artery origins results in severe stenosis on the right and mild stenosis on the left. Skeleton: Left canal wall up mastoidectomy. Congenital C2-3 fusion. Severe right facet arthrosis at C3-4. Other neck: No evidence of cervical lymphadenopathy or mass. Upper chest: Mild mosaic attenuation in the lung apices, possibly air trapping. Scattered small nodules in both lung apices with the largest measuring 4 mm in the right upper lobe (series 5, image 180). Review of the  MIP images confirms the above findings CTA HEAD FINDINGS Anterior circulation: The internal carotid arteries are patent from skull base to carotid termini with calcified plaque resulting in mild-to-moderate left greater than right cavernous and mild left paraclinoid stenoses. ACAs and MCAs are patent with moderate distal branch vessel irregular narrowing but no evidence of proximal branch occlusion or significant proximal stenosis. No aneurysm is identified. Posterior circulation: The intracranial vertebral arteries are widely patent to the basilar. Patent right AICA and bilateral SCA origins are visualized. PICAs and a left AICA are not clearly identified. The basilar artery is patent with a mild-to-moderate stenosis in its midportion. There is a medium-sized left posterior communicating artery. Both PCAs are attenuated with moderate to severe P1 and severe P2 stenoses bilaterally. No aneurysm is identified. Venous sinuses: As permitted by contrast timing, patent. Anatomic variants: None. Review of the MIP images confirms the above findings IMPRESSION: 1. No emergent large vessel occlusion. 2. Age advanced intracranial atherosclerosis including moderate to severe bilateral PCA stenoses in the setting of chronic  bilateral PCA infarcts. 3. Mild-to-moderate basilar artery and bilateral intracranial ICA stenoses. 4. Severe right vertebral artery origin stenosis. 5. Small biapical lung nodules measuring up to 4 mm. No follow-up needed if patient is low-risk (and has no known or suspected primary neoplasm). Non-contrast chest CT can be considered in 12 months if patient is high-risk. This recommendation follows the consensus statement: Guidelines for Management of Incidental Pulmonary Nodules Detected on CT Images: From the Fleischner Society 2017; Radiology 2017; 284:228-243. 6. Aortic Atherosclerosis (ICD10-I70.0). Electronically Signed   By: Sebastian Ache M.D.   On: 02/19/2019 17:03   CT HEAD WO CONTRAST  Result  Date: 02/21/2019 CLINICAL DATA:  Stroke follow-up EXAM: CT HEAD WITHOUT CONTRAST TECHNIQUE: Contiguous axial images were obtained from the base of the skull through the vertex without intravenous contrast. COMPARISON:  CT head 02/19/2019, 11/26/2018 FINDINGS: Brain: Chronic right PCA infarct unchanged. This involves the right occipital lobe extending into the medial temporal lobe. Chronic left PICA infarct unchanged Hypodensity in the left occipital lobe is ill-defined and appears larger compared with 11/26/2018. No change from 02/19/2019. This is most compatible with subacute extension of infarct in the left PCA territory. Generalized atrophy. Negative for hydrocephalus. No acute hemorrhage or mass. Vascular: Negative for hyperdense vessel Skull: Negative Sinuses/Orbits: Chronic infarct left medial orbit. Mild mucosal edema in the paranasal sinuses. Negative orbit bilaterally. Other: None IMPRESSION: Left occipital hypodensity appears ill-defined and has progressed since 11/26/2018. No change from 02/19/2019. Findings compatible with subacute extension of left PCA infarct Chronic infarct right PCA territory unchanged. Chronic left PICA infarct unchanged. Negative for acute hemorrhage. Electronically Signed   By: Marlan Palau M.D.   On: 02/21/2019 09:14   CT HEAD WO CONTRAST  Result Date: 02/19/2019 CLINICAL DATA:  Right-sided weakness EXAM: CT HEAD WITHOUT CONTRAST TECHNIQUE: Contiguous axial images were obtained from the base of the skull through the vertex without intravenous contrast. COMPARISON:  November 26, 2018. FINDINGS: Brain: No evidence of acute territorial infarction, hemorrhage, hydrocephalus,extra-axial collection or mass lesion/mass effect. There is dilatation the ventricles and sulci consistent with age-related atrophy. Low-attenuation changes in the deep white matter consistent with small vessel ischemia. Again noted is extensive areas of encephalomalacia involving the bilateral posterior  parietooccipital lobes with continued evolution from the prior exam. Vascular: No hyperdense vessel or unexpected calcification. Skull: The skull is intact. No fracture or focal lesion identified. Sinuses/Orbits: The visualized paranasal sinuses and mastoid air cells are clear. The orbits and globes intact. Other: None IMPRESSION: Findings consistent with age related atrophy and chronic small vessel ischemia Continued evolution of encephalomalacia in the bilateral posterior parieto-occipital lobes. Electronically Signed   By: Jonna Clark M.D.   On: 02/19/2019 15:47   CT Angio Neck W and/or Wo Contrast  Result Date: 02/19/2019 CLINICAL DATA:  Right upper and lower extremity weakness. EXAM: CT ANGIOGRAPHY HEAD AND NECK TECHNIQUE: Multidetector CT imaging of the head and neck was performed using the standard protocol during bolus administration of intravenous contrast. Multiplanar CT image reconstructions and MIPs were obtained to evaluate the vascular anatomy. Carotid stenosis measurements (when applicable) are obtained utilizing NASCET criteria, using the distal internal carotid diameter as the denominator. CONTRAST:  41mL OMNIPAQUE IOHEXOL 350 MG/ML SOLN COMPARISON:  None. FINDINGS: CTA NECK FINDINGS Aortic arch: Standard 3 vessel aortic arch with moderate atherosclerotic plaque. No significant arch vessel origin stenosis. Right carotid system: Patent with scattered mild, predominantly calcified plaque in the common carotid and proximal internal carotid arteries. No evidence of significant stenosis  or dissection. Left carotid system: Patent with scattered calcified and soft plaque in the common carotid artery and moderate, predominantly calcified plaque at the carotid bifurcation and in the carotid bulb. No evidence of significant stenosis or dissection. Vertebral arteries: Patent with the right being mildly dominant. Calcified plaque at the vertebral artery origins results in severe stenosis on the right and  mild stenosis on the left. Skeleton: Left canal wall up mastoidectomy. Congenital C2-3 fusion. Severe right facet arthrosis at C3-4. Other neck: No evidence of cervical lymphadenopathy or mass. Upper chest: Mild mosaic attenuation in the lung apices, possibly air trapping. Scattered small nodules in both lung apices with the largest measuring 4 mm in the right upper lobe (series 5, image 180). Review of the MIP images confirms the above findings CTA HEAD FINDINGS Anterior circulation: The internal carotid arteries are patent from skull base to carotid termini with calcified plaque resulting in mild-to-moderate left greater than right cavernous and mild left paraclinoid stenoses. ACAs and MCAs are patent with moderate distal branch vessel irregular narrowing but no evidence of proximal branch occlusion or significant proximal stenosis. No aneurysm is identified. Posterior circulation: The intracranial vertebral arteries are widely patent to the basilar. Patent right AICA and bilateral SCA origins are visualized. PICAs and a left AICA are not clearly identified. The basilar artery is patent with a mild-to-moderate stenosis in its midportion. There is a medium-sized left posterior communicating artery. Both PCAs are attenuated with moderate to severe P1 and severe P2 stenoses bilaterally. No aneurysm is identified. Venous sinuses: As permitted by contrast timing, patent. Anatomic variants: None. Review of the MIP images confirms the above findings IMPRESSION: 1. No emergent large vessel occlusion. 2. Age advanced intracranial atherosclerosis including moderate to severe bilateral PCA stenoses in the setting of chronic bilateral PCA infarcts. 3. Mild-to-moderate basilar artery and bilateral intracranial ICA stenoses. 4. Severe right vertebral artery origin stenosis. 5. Small biapical lung nodules measuring up to 4 mm. No follow-up needed if patient is low-risk (and has no known or suspected primary neoplasm).  Non-contrast chest CT can be considered in 12 months if patient is high-risk. This recommendation follows the consensus statement: Guidelines for Management of Incidental Pulmonary Nodules Detected on CT Images: From the Fleischner Society 2017; Radiology 2017; 284:228-243. 6. Aortic Atherosclerosis (ICD10-I70.0). Electronically Signed   By: Sebastian Ache M.D.   On: 02/19/2019 17:03   DG Chest Portable 1 View  Result Date: 02/19/2019 CLINICAL DATA:  Altered mental status. Tracheostomy patient. History of hypertension, congestive heart failure, diabetes and COPD. EXAM: PORTABLE CHEST 1 VIEW COMPARISON:  Radiographs 10/19/2017 and 10/12/2017. FINDINGS: 1656 hours. Tracheostomy appears well positioned. The heart size and mediastinal contours are stable. There is interval improved aeration of the right lung base with probable mild chronic basilar scarring. No new airspace disease, edema, pleural effusion or pneumothorax. The bones appear unchanged. Telemetry leads overlie the chest. IMPRESSION: Interval improved aeration of the right lung base. No acute cardiopulmonary process. Electronically Signed   By: Carey Bullocks M.D.   On: 02/19/2019 17:13   ECHOCARDIOGRAM COMPLETE  Result Date: 02/20/2019   ECHOCARDIOGRAM REPORT   Patient Name:   TYREASE VANDEBERG Date of Exam: 02/20/2019 Medical Rec #:  [id number]  Height:       67.0 in Accession #:    [id number] Weight:       270.0 lb Date of Birth:  15-Jan-1956   BSA:          2.30 m Patient Age:  63 years   BP:           88/67 mmHg Patient Gender: M          HR:           97 bpm. Exam Location:  Inpatient Procedure: 2D Echo, Color Doppler, Cardiac Doppler and Intracardiac            Opacification Agent Indications:    Stroke  History:        Patient has prior history of Echocardiogram examinations, most                 recent 04/25/2015. CHF, COPD; Risk Factors:Hypertension,                 Diabetes, Dyslipidemia and Sleep Apnea.  Sonographer:    Raquel Sarna Senior RDCS  Referring Phys: [id number] Lequita Halt  Sonographer Comments: Technically challenging study due to poor, limited acoustic windows and patient is morbidly obese. IMPRESSIONS  1. Left ventricular ejection fraction, by visual estimation, is 55 to 60%. The left ventricle has normal function. There is no left ventricular hypertrophy.  2. Definity contrast agent was given IV to delineate the left ventricular endocardial borders.  3. Left ventricular diastolic function could not be evaluated.  4. The left ventricle has no regional wall motion abnormalities.  5. Global right ventricle was not well visualized.The right ventricular size is not well visualized. Right vetricular wall thickness was not assessed.  6. Left atrial size was not well visualized.  7. Right atrial size was not well visualized.  8. The mitral valve was not well visualized. No evidence of mitral valve regurgitation.  9. The tricuspid valve is not well visualized. Tricuspid valve regurgitation is not demonstrated. 10. The aortic valve was not well visualized. Aortic valve regurgitation is not visualized. 11. The pulmonic valve was not well visualized. Pulmonic valve regurgitation is not visualized. 12. The aortic root was not well visualized. 13. Very technically difficult study, even with use of echo contrast. Contrast shows normal EF without significant wall motion abnormalities. Limited ability to assess any other features due to body habitus and limited echo windows. 14. The interatrial septum was not well visualized. FINDINGS  Left Ventricle: Left ventricular ejection fraction, by visual estimation, is 55 to 60%. The left ventricle has normal function. Definity contrast agent was given IV to delineate the left ventricular endocardial borders. The left ventricle has no regional wall motion abnormalities. There is no left ventricular hypertrophy. Left ventricular diastolic function could not be evaluated. Right Ventricle: The right ventricular size is  not well visualized. Right vetricular wall thickness was not assessed. Global RV systolic function is was not well visualized. Left Atrium: Left atrial size was not well visualized. Right Atrium: Right atrial size was not well visualized Pericardium: There is no evidence of pericardial effusion. Mitral Valve: The mitral valve was not well visualized. No evidence of mitral valve regurgitation. Tricuspid Valve: The tricuspid valve is not well visualized. Tricuspid valve regurgitation is not demonstrated. Aortic Valve: The aortic valve was not well visualized. Aortic valve regurgitation is not visualized. Pulmonic Valve: The pulmonic valve was not well visualized. Pulmonic valve regurgitation is not visualized. Pulmonic regurgitation is not visualized. Aorta: The aortic root was not well visualized. Venous: The inferior vena cava was not well visualized. IAS/Shunts: The interatrial septum was not well visualized.  RIGHT VENTRICLE RV S prime:     12.60 cm/s TAPSE (M-mode): 2.1 cm LEFT ATRIUM  Index       RIGHT ATRIUM           Index LA Vol (A2C):   26.7 ml 11.62 ml/m RA Area:     16.40 cm LA Vol (A4C):   40.4 ml 17.59 ml/m RA Volume:   43.30 ml  18.85 ml/m LA Biplane Vol: 33.6 ml 14.63 ml/m  AORTIC VALVE LVOT Vmax:   64.00 cm/s LVOT Vmean:  46.400 cm/s LVOT VTI:    0.117 m  SHUNTS Systemic VTI: 0.12 m  Jodelle Red MD Electronically signed by Jodelle Red MD Signature Date/Time: 02/20/2019/4:51:27 PM    Final       Scheduled Meds:   stroke: mapping our early stages of recovery book   Does not apply Once   aspirin  300 mg Rectal Daily   budesonide  0.5 mg Nebulization BID   digoxin  0.25 mg Intravenous Daily   enoxaparin (LOVENOX) injection  40 mg Subcutaneous Q24H   insulin aspart  0-5 Units Subcutaneous QHS   insulin aspart  0-9 Units Subcutaneous TID WC   insulin glargine  10 Units Subcutaneous Daily   ipratropium-albuterol  3 mL Nebulization TID   metoprolol  tartrate  5 mg Intravenous Q6H   Continuous Infusions:  valproate sodium 500 mg (02/21/19 0515)     LOS: 2 days      Time spent: 35 minutes   Noralee Stain, DO [facility] 02/21/2019, 9:38 AM   Available via Epic secure chat 7am-7pm After these hours, please refer to coverage provider listed on [email or web address]

## 2019-02-21 NOTE — Progress Notes (Signed)
STROKE TEAM PROGRESS NOTE       INTERVAL HISTORY Patient is sitting up in bed. He has some tachycardia and increased work of breathing and is on oxygen and has been started on empirical vancomycin for presumed aspiration pneumonia. His echocardiogram was unremarkable. LDL cholesterol is 57 mg percent and hemoglobin A1c is elevated at 8.3. Repeat CT scan of the head showed increase hypodensity in the left occipital lobe in the care of of extension of previous stroke from September 2020. There is also chronic infarct in the right PCA territory. 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  OBJECTIVE Vitals:   02/21/19 0813 02/21/19 1130 02/21/19 1229 02/21/19 1401  BP: (!) 162/87  (!) 165/94   Pulse: (!) 114 (!) 127  (!) 129  Resp: 12 (!) 27  11  Temp: 98.3 F (36.8 C)  98.4 F (36.9 C)   TempSrc: Oral  Oral   SpO2: 93% 93%  92%  Weight:      Height:        CBC:  Recent Labs  Lab 02/19/19 1522 02/20/19 0952 02/21/19 0407  WBC 15.7* 14.0* 19.7*  NEUTROABS 12.2*  --   --   HGB 14.6 13.4 13.3  HCT 44.8 40.0 39.5  MCV 92.0 90.7 89.4  PLT 227 224 239    Basic Metabolic Panel:  Recent Labs  Lab 02/20/19 0952 02/21/19 0407  NA 139 142  K 4.0 4.1  CL 104 105  CO2 26 25  GLUCOSE 131* 128*  BUN 29* 22  CREATININE 1.07 0.96  CALCIUM 8.9 8.8*    Lipid Panel:     Component Value Date/Time   CHOL 128 02/20/2019 0522   CHOL 171 05/26/2017 1522   TRIG 221 (H) 02/20/2019 0522   HDL 27 (L) 02/20/2019 0522   HDL 47 05/26/2017 1522   CHOLHDL 4.7 02/20/2019 0522   VLDL 44 (H) 02/20/2019 0522   LDLCALC 57 02/20/2019 0522   LDLCALC 99 05/26/2017 1522   HgbA1c:  Lab Results  Component Value Date   HGBA1C 8.3 (H) 02/20/2019   Urine Drug Screen:     Component Value Date/Time   LABOPIA NONE DETECTED 02/19/2019 2050   COCAINSCRNUR NONE DETECTED 02/19/2019 2050   LABBENZ NONE DETECTED 02/19/2019 2050   AMPHETMU NONE DETECTED 02/19/2019 2050   THCU NONE  DETECTED 02/19/2019 2050   LABBARB NONE DETECTED 02/19/2019 2050    Alcohol Level     Component Value Date/Time   ETH <10 02/19/2019 2354    IMAGING  CT Angio Head W or Wo Contrast  CT Angio Neck W and/or Wo Contrast 02/19/2019 IMPRESSION:  1. No emergent large vessel occlusion.  2. Age advanced intracranial atherosclerosis including moderate to severe bilateral PCA stenoses in the setting of chronic bilateral PCA infarcts.  3. Mild-to-moderate basilar artery and bilateral intracranial ICA stenoses.  4. Severe right vertebral artery origin stenosis.  5. Small biapical lung nodules measuring up to 4 mm. No follow-up needed if patient is low-risk (and has no known or suspected primary neoplasm). Non-contrast chest CT can be considered in 12 months if patient is high-risk. This recommendation follows the consensus statement: Guidelines for Management of Incidental Pulmonary Nodules Detected on CT Images: From the Fleischner Society 2017; Radiology 2017; 284:228-243.  6. Aortic Atherosclerosis (ICD10-I70.0).  CT HEAD WO CONTRAST 02/19/2019 IMPRESSION:  Findings consistent with age related atrophy and chronic small vessel ischemia Continued evolution of encephalomalacia in the bilateral posterior parieto-occipital lobes.  MRI Brain W&Wo Contrast - pending 02/20/19  DG Chest Portable 1 View 02/19/2019 IMPRESSION:  Interval improved aeration of the right lung base. No acute cardiopulmonary process.   Transthoracic Echocardiogram  00/00/2020 Pending   ECG - SR rate 73 BPM. (See cardiology reading for complete details)   PHYSICAL EXAM Blood pressure (!) 165/94, pulse (!) 129, temperature 98.4 F (36.9 C), temperature source Oral, resp. rate 11, height 5\' 7"  (1.702 m), weight 122.5 kg, SpO2 92 %. Patient has a tracheostomy and is on trach collar oxygen.  Is in mild respiratory distress.Neurological Exam : Awake alert unable to test orientation questions due to tracheostomy.   Follows simple midline and 1 and two-step commands.  Extraocular movements are full range without nystagmus. 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 He is unable to perceive light with pupils are small sluggishly reactive. Fundi were not visualized..  Right lower facial weakness.  Tongue deviates to the right.  Dense right hemiplegia with hypotonia and flaccidity.  Trace withdrawal in the right lower extremity to pain but not in the upper extremity.  Able to move left upper and lower extremities purposefully against gravity without weakness.  Sensation appears diminished on the right compared to the left.  Right plantar is upgoing left is downgoing.  Gait not tested.      Mr. Doy Taaffe is a 63 y.o. male with history of CHF, diabetes, COPD, Hld, Htn, hearing loss, peripheral neuropathy, chronic pain syndrome and trach dependent brought here via EMS from [facility] with altered mental status, right facial droop, right side weakness and visual field changes. 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   MRI head - pending  MRA head - not ordered  CTA H&N - No emergent large vessel occlusion. Age advanced intracranial atherosclerosis including moderate to severe bilateral PCA stenoses in the setting of chronic bilateral PCA infarcts. Mild-to-moderate basilar artery and bilateral intracranial ICA stenoses. Severe right vertebral artery origin stenosis.   CT Perfusion - not ordered  Carotid Doppler - CTA neck  performed - carotid dopplers not indicated.  2D Echo -normal ejection fraction. No cardiac source of embolism.  [employer] Virus 2  - negative  LDL - 57  HgbA1c - 8.3  UDS - negative  VTE prophylaxis - Lovenox Diet  Diet Order            Diet NPO time specified  Diet effective now              No antithrombotic prior to admission, now on aspirin 300 mg suppository daily but switch to aspirin 81 and Plavix 75 mg daily for 3 months when able to swallow followed by aspirin alone  Patient counseled to be compliant with his antithrombotic medications  Ongoing aggressive stroke risk factor management  Therapy recommendations:  SNF  Disposition:  SNF  Hypertension  Home BP meds: Zestril , metoprolol  Current BP meds: none   Stable . Permissive hypertension (OK if < 220/120) but gradually normalize in 5-7 days  . Long-term  BP goal normotensive  Hyperlipidemia  Home Lipid lowering medication: none   LDL 57, goal < 70  Current lipid lowering medication: none   Continue statin at discharge  Diabetes  Home diabetic meds: Insulin  Current diabetic meds: Insulin  HgbA1c 8.3, goal < 7.0 Recent Labs    02/20/19 2117 02/21/19 0601 02/21/19 1221  GLUCAP 115* 138* 116*    Other Stroke Risk Factors  Advanced age  Former cigarette smoker - quit  Obesity, Body mass index is 42.29 kg/m., recommend weight loss, diet and exercise as appropriate   Hx stroke/TIA - by imaging  Congestive Heart Failure  OSA -> tracheostomy  Other Active Problems  Small biapical lung nodules measuring up to 4 mm. No follow-up needed if patient is low-risk (and has no known or suspected primary neoplasm). Non-contrast chest CT can be considered in 12 months if patient is high-risk.  Leukocytosis - 15.7->14.0 (afebrile)  Low grade tachycardia   Hospital day # 2  He presented with right hemiplegia and visual deficits likely due to left hemispheric infarct likely from  underlying advanced intracranial atherosclerotic changes.  Recommend dual antiplatelet therapy aspirin and Plavix for 3 months followed by aspirin alone check MRI scan of the brain,   and aggressive risk factor modification.  Therapy consults.  Greater than 50% time during this 25-minute visit was spent on counseling and coordination of care about his stroke and discussion with care team.  Discussed with Dr. Alvino Chapel.Stroke Team will sign off.Call for questions.  Delia Heady, MD Medical Director [facility] Pager: [phone] 02/21/2019 2:05 PM   To contact Stroke Continuity provider, please refer to [email or web address]. After hours, contact General Neurology

## 2019-02-21 NOTE — Evaluation (Addendum)
Passy-Muir Speaking Valve - Evaluation Patient Details  Name: Tyrone Cunningham MRN: [id number] Date of Birth: April 09, 1955  Today's Date: 02/21/2019 Time: 3154-0086 SLP Time Calculation (min) (ACUTE ONLY): 39 min  Past Medical History:  Past Medical History:  Diagnosis Date  . Bronchitis   . CHF (congestive heart failure) (HCC)    grade 1 diastolic with preserved EF in 2017  . COPD (chronic obstructive pulmonary disease) ([place])   . Diabetes mellitus without complication ([place])   . DM type 2 with diabetic peripheral neuropathy ([place])   . Hearing loss   . Hypercholesteremia   . Hypertension   . Peripheral neuropathy   . Sleep apnea    Severe sleep apnea requiring tracheostomy   Past Surgical History:  Past Surgical History:  Procedure Laterality Date  . APPENDECTOMY    . CHOLECYSTECTOMY N/A 01/05/2014   Procedure: LAPAROSCOPIC CHOLECYSTECTOMY;  Surgeon: Rolm Bookbinder, MD;  Location: [facility];  Service: General;  Laterality: N/A;  . FEMUR HARDWARE REMOVAL  09/01/2003   Removal of retained hardware, two interlocking distal femoral  . FEMUR SURGERY    . HAND SURGERY    . MULTIPLE EXTRACTIONS WITH ALVEOLOPLASTY N/A 05/11/2012   Procedure: MULTIPLE EXTRACTION WITH ALVEOLOPLASTY;  Surgeon: Gae Bon, DDS;  Location: [facility];  Service: Oral Surgery;  Laterality: N/A;  . TRACHEOSTOMY     HPI:  Tyrone Cunningham is a 63 y.o. male with medical history significant of CHF, diabetes, chronic pain syndrome, was brought to ED via EMS from [place] facility for evaluation of altered mental status. Per nursing home staff, pt appears to have right sided facial drooping, and right-sided weakness.  Last known normal was yesterday. He is trach dependent.  No previous ST notes in Epic.  Pt reported that he does not wear a PMV and usually finger occludes or mouths words to communicate.     Assessment / Plan / Recommendation Clinical Impression  Pt was seen for a speaking valve evaluation in the setting of  aphonia secondary to trach.  Pt was encountered awake/alert and he was agreeable to evaluation.  Pt has a Shiley 6 cuffless trach and he reported that he has had a trach for "at least 4 years".  He additionally stated that he used to have a [employer] Speaking Valve (PMV) but that he lost it "a while ago" and that he generally utilizes finger occludes for communication.  SLP competed finger occulusion x2 prior to [place] trials.   During the first attempt, pt achieved phonation, but it was weak and wet.  During second attempt pt achieved strong and clear phonation.  No back pressure was observed following finger occlusion trials.  SLP placed PMV and pt was able to verbalize with good vocal intensity, but he was observed to have intermittent wet vocal quality.  Pt tolerated PMV for 20+ minutes without s/sx of respiratory distress or significant changes in vitals.  Pt did not attempt to move sputum to his oral cavity independently; however, he was able to mobilize some sputum given verbal cues to cough and it was subsequently suctioned from his oral cavity.  Pt was alert, but appeared fatigued throughout this session.  Secondary to fatigue and secretion management, recommend that PMV be worn mostly with Speech Therapy at this time.  Nursing and trained staff may don PMV to communicate with pt as needed.  Additionally recommend that pt wear PMV during po trials during future ST treatment sessions.  At the end of the session,  PMV was doffed and it was tethered to the trach.   Signs with the beforementioned recommendations were hung in the pt's room.  SLP will f/u for treatment per POC.    SLP Visit Diagnosis: Aphonia (R49.1)    SLP Assessment  Patient needs continued Speech Lanaguage Pathology Services    Follow Up Recommendations  Skilled Nursing facility;24 hour supervision/assistance    Frequency and Duration min 2x/week  2 weeks    PMSV Trial PMSV was placed for: 20 minutes Able to redirect subglottic  air through upper airway: Yes Able to Attain Phonation: Yes Voice Quality: Wet Able to Expectorate Secretions: Yes(with cues ) Level of Secretion Expectoration with PMSV: Oral Breath Support for Phonation: Adequate Intelligibility: Intelligibility reduced Word: 25-49% accurate Phrase: 25-49% accurate Conversation: 25-49% accurate Respirations During Trial: (Ranged from 17-29) SpO2 During Trial: (Ranged from 91-94) Pulse During Trial: (Ranged from 106-110) Behavior: Alert   Tracheostomy Tube       Vent Dependency  FiO2 (%): 40 %    Cuff Deflation Trial  GO Length of Time for Cuff Deflation Trial: Pt has uncuffed trach Behavior: Alert;Good eye contact;Cooperative;Expresses self well        [place]illa Herb M.S., CCC-SLP Acute Rehabilitation Services Office: [phone]  Shanon Rosser [facility] 02/21/2019, 3:28 PM

## 2019-02-21 NOTE — Progress Notes (Signed)
Pharmacy Antibiotic Note  Tyrone Cunningham is a 63 y.o. male admitted on 02/19/2019 due to stroke-like symptoms. Today he is having tachycardia with increased work of breathing. He will be started on empiric antibiotics for a possible pneumonia. SCr is stable at 0.9.  Vancomycin 1000 mg IV Q 12 hrs. Goal AUC 400-550. Expected AUC: 480 SCr used: 0.9   Plan: -Vancomycin 2 g IV x1 then 1g/12h -Monitor renal fx, cultures, vancomycin levels as needed   Height: 5\' 7"  (170.2 cm) Weight: 270 lb (122.5 kg) IBW/kg (Calculated) : 66.1  Temp (24hrs), Avg:98.5 F (36.9 C), Min:98.2 F (36.8 C), Max:99.3 F (37.4 C)  Recent Labs  Lab 02/19/19 1522 02/19/19 1527 02/20/19 0952 02/21/19 0407  WBC 15.7*  --  14.0* 19.7*  CREATININE 1.38* 1.10 1.07 0.96    Estimated Creatinine Clearance: 96.2 mL/min (by C-G formula based on SCr of 0.96 mg/dL).     Antimicrobials this admission: 12/21 vancomycin > 12/21 zosyn >  Microbiology results: 12/21 blood cx: 12/20 mrsa pcr: 12/19 Covid-19: neg   Teddi Badalamenti, Jake Church 02/21/2019 12:51 PM

## 2019-02-21 NOTE — Progress Notes (Signed)
  Speech Language Pathology Treatment: Cognitive-Linquistic  Patient Details Name: Tyrone Cunningham MRN: [id number] DOB: [birth year] Today's Date: 02/21/2019 Time: 6433-2951 SLP Time Calculation (min) (ACUTE ONLY): 18 min  Assessment / Plan / Recommendation Clinical Impression  Pt participated in cognitive-linguistic treatment during his speaking valve evaluation.  With PMV donned, pt was able to verbalized and moderate-severe dysarthria was observed.  He was approximately 50% intelligible at the word level and 35% intelligible at the phrase and short sentence level.  Speech intelligible improved slightly given cues for over-articulation.  Pt was oriented to self and city independently.  He was initially not oriented to year or month; however, when he was presented with a choice of two, he was able to select the correct answer.  Pt was additionally not oriented to place, stating that he was at [facility].  Pt followed 1 step commands with 100% accuracy given min cues.  Expressive language appeared impaired with suspected neologisms and paraphasias during conversational speech; however, difficulty to determine secondary to dysarthria.  Pt exhibited decreased sustained attention throughout this session and he benefited from verbal cues to track SLP and redirect attention to tasks.  Recommend continued ST targeting cognitive-linguistic treatment.     HPI HPI: Tyrone Cunningham is a 63 y.o. male with medical history significant of CHF, diabetes, chronic pain syndrome, was brought to ED via EMS from [place] facility for evaluation of altered mental status. Per nursing home staff, pt appears to have right sided facial drooping, and right-sided weakness.  Last known normal was yesterday. He is trach dependent.  No previous ST notes in Epic.  Pt reported that he does not wear a PMV and usually finger occludes or mouths words to communicate.        SLP Plan  Continue with current plan of care  Patient  needs continued Speech Lanaguage Pathology Services    Recommendations  Diet recommendations: NPO Medication Administration: Via alternative means      Patient may use Passy-Muir Speech Valve: Intermittently with supervision PMSV Supervision: Full         Oral Care Recommendations: Oral care QID;Staff/trained caregiver to provide oral care Follow up Recommendations: Skilled Nursing facility;24 hour supervision/assistance SLP Visit Diagnosis: Aphonia (R49.1) Plan: Continue with current plan of care       [place]., M.S., [place] Acute Rehabilitation Services Office: [phone]  [place] 02/21/2019, 3:39 PM

## 2019-02-21 NOTE — Progress Notes (Signed)
  Speech Language Pathology Treatment: Dysphagia  Patient Details Name: Tyrone Cunningham MRN: [id number] DOB: 08-12-1955 Today's Date: 02/21/2019 Time: 6962-9528 SLP Time Calculation (min) (ACUTE ONLY): 20 min  Assessment / Plan / Recommendation Clinical Impression  Pt seen at bedside to determine readiness for po/instrumental assessment and willingness to try PMSV. Oral care was completed with suction, which pt tolerated well. Following oral care, pt was given individual ice chips which resulted in wet voice quality (finger occlusion to trach). Pt was encouraged to try PMSV for improved swallow safety and communicative effectiveness, as well as to decrease infection risk that is increased with use of finger occlusion. Pt verbalized willingness to try PMSV. Order received from MD. Recommend continuing with NPO status. Will proceed with PMSV evaluation.   HPI HPI: Tyrone Cunningham is a 63 y.o. male with medical history significant of CHF, diabetes, chronic pain syndrome, was brought to ED via EMS from [place] facility for evaluation of altered mental status. Per nursing home staff, pt appears to have right sided facial drooping, and right-sided weakness.  Last known normal was yesterday. He is trach dependent.  No previous ST notes in Epic.  Pt reported that he does not wear a PMV and usually finger occludes or mouths words to communicate.        SLP Plan  Continue with current plan of care       Recommendations  Diet recommendations: NPO Medication Administration: Via alternative means                Oral Care Recommendations: Oral care QID;Staff/trained caregiver to provide oral care Follow up Recommendations: Skilled Nursing facility;24 hour supervision/assistance SLP Visit Diagnosis: Dysphagia, unspecified (R13.10) Plan: Continue with current plan of care       [place], [facility], [place] Speech Language Pathologist Office: [phone] Pager:  [phone]  Shonna Chock 02/21/2019, 12:47 PM

## 2019-02-22 ENCOUNTER — Inpatient Hospital Stay (HOSPITAL_COMMUNITY): Payer: Medicare Other

## 2019-02-22 LAB — MRSA CULTURE: Culture: DETECTED

## 2019-02-22 LAB — CBC
HCT: 40.1 % (ref 39.0–52.0)
Hemoglobin: 13 g/dL (ref 13.0–17.0)
MCH: 30.3 pg (ref 26.0–34.0)
MCHC: 32.4 g/dL (ref 30.0–36.0)
MCV: 93.5 fL (ref 80.0–100.0)
Platelets: 206 10*3/uL (ref 150–400)
RBC: 4.29 MIL/uL (ref 4.22–5.81)
RDW: 13.4 % (ref 11.5–15.5)
WBC: 20.8 10*3/uL — ABNORMAL HIGH (ref 4.0–10.5)
nRBC: 0 % (ref 0.0–0.2)

## 2019-02-22 LAB — GLUCOSE, CAPILLARY
Glucose-Capillary: 116 mg/dL — ABNORMAL HIGH (ref 70–99)
Glucose-Capillary: 134 mg/dL — ABNORMAL HIGH (ref 70–99)
Glucose-Capillary: 135 mg/dL — ABNORMAL HIGH (ref 70–99)
Glucose-Capillary: 92 mg/dL (ref 70–99)
Glucose-Capillary: 98 mg/dL (ref 70–99)

## 2019-02-22 MED ORDER — FLUCONAZOLE 100MG IVPB
100.0000 mg | INTRAVENOUS | Status: AC
  Administered 2019-02-22 – 2019-02-28 (×7): 100 mg via INTRAVENOUS
  Filled 2019-02-22 (×8): qty 50

## 2019-02-22 MED ORDER — SODIUM CHLORIDE 0.9 % IV SOLN
INTRAVENOUS | Status: DC | PRN
  Administered 2019-02-22 – 2019-02-24 (×3): 250 mL via INTRAVENOUS

## 2019-02-22 MED ORDER — SODIUM CHLORIDE 0.9 % IV SOLN
2.0000 g | INTRAVENOUS | Status: DC
  Administered 2019-02-22 – 2019-02-28 (×7): 2 g via INTRAVENOUS
  Filled 2019-02-22 (×4): qty 20
  Filled 2019-02-22 (×2): qty 2
  Filled 2019-02-22: qty 20

## 2019-02-22 MED ORDER — ENOXAPARIN SODIUM 60 MG/0.6ML ~~LOC~~ SOLN
60.0000 mg | SUBCUTANEOUS | Status: DC
  Administered 2019-02-23 – 2019-03-11 (×17): 60 mg via SUBCUTANEOUS
  Filled 2019-02-22 (×19): qty 0.6

## 2019-02-22 MED ORDER — METRONIDAZOLE IN NACL 5-0.79 MG/ML-% IV SOLN
500.0000 mg | Freq: Three times a day (TID) | INTRAVENOUS | Status: DC
  Administered 2019-02-22 – 2019-02-28 (×18): 500 mg via INTRAVENOUS
  Filled 2019-02-22 (×18): qty 100

## 2019-02-22 NOTE — Progress Notes (Signed)
PROGRESS NOTE    Tyrone BaileyMark Cunningham  ZOX:[id number] DOB: 02/21/1956 DOA: 02/19/2019 PCP: System, Pcp Not In     Brief Narrative:  Tyrone BaileyMark Cunningham is a 63 y.o. male with past medical history significant of chronic CHF, diabetes, chronic pain syndrome, chronic trach, [facility] ED [facility] for evaluation of altered mental status.Per nursing home staff, pt appears to have right sided facial drooping, right-sided weakness, and not behaving himself this mornnig. Last known normal was day before admission. He is usually mostly bedbound but can walk on occasion. He is trach dependent. In the ED, head CT showed no acute changes and CTA showed no LVO. He was outside of the tPA window and not a candidate for thrombectomy because he did not have a large vessel occlusion.Neurology consulted.   New events last 24 hours / Subjective: No new issues.  Heart rate has improved since yesterday.  Assessment & Plan:   Active Problems:   Stroke (cerebrum) (HCC)   Right sided hemiplegia, right facial droop  -CT head: Findings consistent with age related atrophy and chronic small vessel ischemia. Continued evolution of encephalomalacia in the bilateral posterior parieto-occipital lobes. -CTA head and neck: No emergent large vessel occlusion. Age advanced intracranial atherosclerosis including moderate to severe bilateral PCA stenoses in the setting of chronic bilateral PCA infarcts. -Not a candidate for TPA, thrombectomy  -Repeat CT head: Left occipital hypodensity appears ill-defined and has progressed since 11/26/2018. No change from 02/19/2019. Findings compatible with subacute extension of left PCA infarct. Chronic infarct right PCA territory unchanged. Chronic left PICA infarct unchanged. Negative for acute hemorrhage. -Echo EF 55-60%. Contrast shows normal EF without significant wall motion abnormalities. Limited ability to assess any other features due to body habitus and  limited echo windows -PT OT SLP.  Remains n.p.o. for now per SLP eval  -Neurology following  -Aspirin  -MRI brain pending  Sepsis secondary to pneumonia, sepsis not POA -Chest x-ray obtained due to tachycardia and leukocytosis, shows increased right basilar opacity -Blood cultures pending, gram-positive cocci in clusters so far -Continue vancomycin, ceftriaxone, Flagyl  Essential hypertension -Hold lisinopril to allow for permissive hypertension -Continue lopressor   Chronic diastolic heart failure -Digoxin  Type 2 diabetes, insulin-dependent, poorly controlled -Ha1c 8.3 -Lantus, sliding scale insulin  Hyperlipidemia -Resume statin when able to take p.o.  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 No follow-up needed if patient is low-risk (and has no known or suspected primary neoplasm). Non-contrast chest CT can be considered in 12 months if patient is high-risk.  -Outpatient follow up   Unclear why he is on depakote and seroquel. Changed depakote to IV while NPO. Hold seroquel for now due to NPO.    DVT prophylaxis: Lovenox  Code Status: Full Family Communication: None at bedside Disposition Plan: Pending neurology work up, clinical improvement in sepsis.  Plan for return back to SNF once stable   Consultants:   Neurology  Procedures:   None   Antimicrobials:  Anti-infectives (From admission, onward)   Start     Dose/Rate Route Frequency Ordered Stop   02/22/19 1400  metroNIDAZOLE (FLAGYL) IVPB 500 mg     500 mg 100 mL/hr over 60 Minutes Intravenous Every 8 hours 02/22/19 0936     02/22/19 1400  cefTRIAXone (ROCEPHIN) 2 g in sodium chloride 0.9 % 100 mL  IVPB     2 g 200 mL/hr over 30 Minutes Intravenous Every 24 hours 02/22/19 0936     02/22/19 0200  vancomycin (VANCOCIN) IVPB 1000 mg/200 mL premix      1,000 mg 200 mL/hr over 60 Minutes Intravenous Every 12 hours 02/21/19 1314     02/21/19 1400  piperacillin-tazobactam (ZOSYN) IVPB 3.375 g  Status:  Discontinued     3.375 g 100 mL/hr over 30 Minutes Intravenous Every 8 hours 02/21/19 1228 02/21/19 1251   02/21/19 1400  piperacillin-tazobactam (ZOSYN) IVPB 3.375 g  Status:  Discontinued     3.375 g 12.5 mL/hr over 240 Minutes Intravenous Every 8 hours 02/21/19 1252 02/22/19 0936   02/21/19 1400  vancomycin (VANCOREADY) IVPB 2000 mg/400 mL     2,000 mg 200 mL/hr over 120 Minutes Intravenous  Once 02/21/19 1314 02/21/19 1615       Objective: Vitals:   02/22/19 0804 02/22/19 0822 02/22/19 0828 02/22/19 0847  BP: (!) 175/77   (!) 155/79  Pulse: (!) 102   (!) 108  Resp: (!) 26   (!) 22  Temp: (!) 97.5 F (36.4 C)     TempSrc: Axillary     SpO2: 93% 92% 94%   Weight:      Height:        Intake/Output Summary (Last 24 hours) at 02/22/2019 1023 Last data filed at 02/22/2019 1000 Gross per 24 hour  Intake 2112.69 ml  Output 1100 ml  Net 1012.69 ml   Filed Weights   02/19/19 1519  Weight: 122.5 kg    Examination: General exam: Appears calm and comfortable  Respiratory system: + Rhonchi.  On trach collar Cardiovascular system: S1 & S2 heard, tachycardic, regular rhythm. No pedal edema. Gastrointestinal system: Abdomen is nondistended, soft and nontender. Normal bowel sounds heard. Central nervous system: Alert, remains hemiplegic on right Extremities: Symmetric in appearance bilaterally  Skin: No rashes, lesions or ulcers on exposed skin     Data Reviewed: I have personally reviewed following labs and imaging studies  CBC: Recent Labs  Lab 02/19/19 1522 02/19/19 1527 02/20/19 0952 02/21/19 0407 02/22/19 0220  WBC 15.7*  --  14.0* 19.7* 20.8*  NEUTROABS 12.2*  --   --   --   --   HGB 14.6 15.0 13.4 13.3 13.0  HCT 44.8 44.0 40.0 39.5 40.1  MCV 92.0  --  90.7 89.4 93.5  PLT 227  --  224 239 206   Basic  Metabolic Panel: Recent Labs  Lab 02/19/19 1522 02/19/19 1527 02/20/19 0952 02/21/19 0407  NA 137 136 139 142  K 5.1 4.9 4.0 4.1  CL 99 102 104 105  CO2 27  --  26 25  GLUCOSE 205* 198* 131* 128*  BUN 33* 35* 29* 22  CREATININE 1.38* 1.10 1.07 0.96  CALCIUM 9.3  --  8.9 8.8*   GFR: Estimated Creatinine Clearance: 96.2 mL/min (by C-G formula based on SCr of 0.96 mg/dL). Liver Function Tests: Recent Labs  Lab 02/19/19 1522  AST 14*  ALT 17  ALKPHOS 82  BILITOT 0.3  PROT 7.0  ALBUMIN 3.0*   No results for input(s): LIPASE, AMYLASE in the last 168 hours. No results for input(s): AMMONIA in the last 168 hours. Coagulation Profile: Recent Labs  Lab 02/19/19 1522  INR 1.0   Cardiac Enzymes: No results for input(s): CKTOTAL, CKMB, CKMBINDEX, TROPONINI in the last 168 hours. BNP (last 3 results) No results for input(s): PROBNP in the last 8760 hours.  HbA1C: Recent Labs    02/20/19 0522  HGBA1C 8.3*   CBG: Recent Labs  Lab 02/21/19 0601 02/21/19 1221 02/21/19 1643 02/21/19 2053 02/22/19 0633  GLUCAP 138* 116* 128* 132* 134*   Lipid Profile: Recent Labs    02/20/19 0522  CHOL 128  HDL 27*  LDLCALC 57  TRIG 221*  CHOLHDL 4.7   Thyroid Function Tests: No results for input(s): TSH, T4TOTAL, FREET4, T3FREE, THYROIDAB in the last 72 hours. Anemia Panel: No results for input(s): VITAMINB12, FOLATE, FERRITIN, TIBC, IRON, RETICCTPCT in the last 72 hours. Sepsis Labs: No results for input(s): PROCALCITON, LATICACIDVEN in the last 168 hours.  Recent Results (from the past 240 hour(s))  SARS CORONAVIRUS 2 (TAT 6-24 HRS) Nasopharyngeal Nasopharyngeal Swab     Status: None   Collection Time: 02/19/19  3:44 PM   Specimen: Nasopharyngeal Swab  Result Value Ref Range Status   SARS Coronavirus 2 NEGATIVE NEGATIVE Final    Comment: (NOTE) SARS-CoV-2 target nucleic acids are NOT DETECTED. The SARS-CoV-2 RNA is generally detectable in upper and lower respiratory  specimens during the acute phase of infection. Negative results do not preclude SARS-CoV-2 infection, do not rule out co-infections with other pathogens, and should not be used as the sole basis for treatment or other patient management decisions. Negative results must be combined with clinical observations, patient history, and epidemiological information. The expected result is Negative. Fact Sheet for Patients: SugarRoll.be Fact Sheet for Healthcare Providers: https://www.woods-mathews.com/ This test is not yet approved or cleared by the [place] FDA and  has been authorized for detection and/or diagnosis of SARS-CoV-2 by FDA under an Emergency Use Authorization (EUA). This EUA will remain  in effect (meaning this test can be used) for the duration of the COVID-19 declaration under Section 56 4(b)(1) of the Act, 21 U.S.C. section 360bbb-3(b)(1), unless the authorization is terminated or revoked sooner. Performed at [facility], [place] [address] [place], [place] [address]   Culture, blood (routine x 2)     Status: None (Preliminary result)   Collection Time: 02/21/19 10:30 AM   Specimen: BLOOD RIGHT HAND  Result Value Ref Range Status   Specimen Description BLOOD RIGHT HAND  Final   Special Requests   Final    BOTTLES DRAWN AEROBIC ONLY Blood Culture adequate volume   Culture   Final    NO GROWTH < 24 HOURS Performed at [facility], [place] [address] [place], [place] [address]    Report Status PENDING  Incomplete  Culture, blood (routine x 2)     Status: None (Preliminary result)   Collection Time: 02/21/19 10:38 AM   Specimen: BLOOD  Result Value Ref Range Status   Specimen Description BLOOD RIGHT ANTECUBITAL  Final   Special Requests   Final    BOTTLES DRAWN AEROBIC ONLY Blood Culture adequate volume   Culture  Setup Time   Final    GRAM POSITIVE COCCI IN CLUSTERS AEROBIC BOTTLE ONLY CRITICAL RESULT CALLED TO, READ  BACK BY AND VERIFIED WITH: Sharen Heck PHARMD, AT 6195 02/22/19 BY Rush Landmark Performed at [facility], [place] [address] [place], [place] [address]    Culture GRAM POSITIVE COCCI  Final   Report Status PENDING  Incomplete      Radiology Studies: CT HEAD WO CONTRAST  Result Date: 02/21/2019 CLINICAL DATA:  Stroke follow-up EXAM: CT HEAD WITHOUT CONTRAST TECHNIQUE: Contiguous axial images were obtained from the base of the skull through the vertex without  intravenous contrast. COMPARISON:  CT head 02/19/2019, 11/26/2018 FINDINGS: Brain: Chronic right PCA infarct unchanged. This involves the right occipital lobe extending into the medial temporal lobe. Chronic left PICA infarct unchanged Hypodensity in the left occipital lobe is ill-defined and appears larger compared with 11/26/2018. No change from 02/19/2019. This is most compatible with subacute extension of infarct in the left PCA territory. Generalized atrophy. Negative for hydrocephalus. No acute hemorrhage or mass. Vascular: Negative for hyperdense vessel Skull: Negative Sinuses/Orbits: Chronic infarct left medial orbit. Mild mucosal edema in the paranasal sinuses. Negative orbit bilaterally. Other: None IMPRESSION: Left occipital hypodensity appears ill-defined and has progressed since 11/26/2018. No change from 02/19/2019. Findings compatible with subacute extension of left PCA infarct Chronic infarct right PCA territory unchanged. Chronic left PICA infarct unchanged. Negative for acute hemorrhage. Electronically Signed   By: Marlan Palau M.D.   On: 02/21/2019 09:14   DG CHEST PORT 1 VIEW  Result Date: 02/21/2019 CLINICAL DATA:  Altered mental status. History of bronchitis, congestive heart failure, COPD, diabetes, hypertension and sleep apnea. EXAM: PORTABLE CHEST 1 VIEW COMPARISON:  Radiographs 02/19/2019 and 10/19/2017. FINDINGS: 1159 hours. The tracheostomy appears unchanged. The heart size and mediastinal contours are stable  with aortic atherosclerosis. Interval increased opacity at the right lung base which may reflect worsening atelectasis or developing infiltrate. Probable mild left basilar scarring is unchanged. There may be a small amount of pleural fluid on the right. No pneumothorax or edema. The bones appear stable. IMPRESSION: 1. Increased right basilar opacity may reflect worsening atelectasis or developing infiltrate. Possible small right pleural effusion. 2. No other significant changes. Electronically Signed   By: Carey Bullocks M.D.   On: 02/21/2019 12:23   ECHOCARDIOGRAM COMPLETE  Result Date: 02/20/2019   ECHOCARDIOGRAM REPORT   Patient Name:   Tyrone Cunningham Date of Exam: 02/20/2019 Medical Rec #:  [id number]  Height:       67.0 in Accession #:    [id number] Weight:       270.0 lb Date of Birth:  09-28-1955   BSA:          2.30 m Patient Age:    63 years   BP:           88/67 mmHg Patient Gender: M          HR:           97 bpm. Exam Location:  Inpatient Procedure: 2D Echo, Color Doppler, Cardiac Doppler and Intracardiac            Opacification Agent Indications:    Stroke  History:        Patient has prior history of Echocardiogram examinations, most                 recent 04/25/2015. CHF, COPD; Risk Factors:Hypertension,                 Diabetes, Dyslipidemia and Sleep Apnea.  Sonographer:    Irving Burton Senior RDCS Referring Phys: [id number] [place] General  Sonographer Comments: Technically challenging study due to poor, limited acoustic windows and patient is morbidly obese. IMPRESSIONS  1. Left ventricular ejection fraction, by visual estimation, is 55 to 60%. The left ventricle has normal function. There is no left ventricular hypertrophy.  2. Definity contrast agent was given IV to delineate the left ventricular endocardial borders.  3. Left ventricular diastolic function could not be evaluated.  4. The left ventricle has no regional wall motion abnormalities.  5. Global right ventricle  was not well visualized.The right  ventricular size is not well visualized. Right vetricular wall thickness was not assessed.  6. Left atrial size was not well visualized.  7. Right atrial size was not well visualized.  8. The mitral valve was not well visualized. No evidence of mitral valve regurgitation.  9. The tricuspid valve is not well visualized. Tricuspid valve regurgitation is not demonstrated. 10. The aortic valve was not well visualized. Aortic valve regurgitation is not visualized. 11. The pulmonic valve was not well visualized. Pulmonic valve regurgitation is not visualized. 12. The aortic root was not well visualized. 13. Very technically difficult study, even with use of echo contrast. Contrast shows normal EF without significant wall motion abnormalities. Limited ability to assess any other features due to body habitus and limited echo windows. 14. The interatrial septum was not well visualized. FINDINGS  Left Ventricle: Left ventricular ejection fraction, by visual estimation, is 55 to 60%. The left ventricle has normal function. Definity contrast agent was given IV to delineate the left ventricular endocardial borders. The left ventricle has no regional wall motion abnormalities. There is no left ventricular hypertrophy. Left ventricular diastolic function could not be evaluated. Right Ventricle: The right ventricular size is not well visualized. Right vetricular wall thickness was not assessed. Global RV systolic function is was not well visualized. Left Atrium: Left atrial size was not well visualized. Right Atrium: Right atrial size was not well visualized Pericardium: There is no evidence of pericardial effusion. Mitral Valve: The mitral valve was not well visualized. No evidence of mitral valve regurgitation. Tricuspid Valve: The tricuspid valve is not well visualized. Tricuspid valve regurgitation is not demonstrated. Aortic Valve: The aortic valve was not well visualized. Aortic valve regurgitation is not visualized. Pulmonic  Valve: The pulmonic valve was not well visualized. Pulmonic valve regurgitation is not visualized. Pulmonic regurgitation is not visualized. Aorta: The aortic root was not well visualized. Venous: The inferior vena cava was not well visualized. IAS/Shunts: The interatrial septum was not well visualized.  RIGHT VENTRICLE RV S prime:     12.60 cm/s TAPSE (M-mode): 2.1 cm LEFT ATRIUM             Index       RIGHT ATRIUM           Index LA Vol (A2C):   26.7 ml 11.62 ml/m RA Area:     16.40 cm LA Vol (A4C):   40.4 ml 17.59 ml/m RA Volume:   43.30 ml  18.85 ml/m LA Biplane Vol: 33.6 ml 14.63 ml/m  AORTIC VALVE LVOT Vmax:   64.00 cm/s LVOT Vmean:  46.400 cm/s LVOT VTI:    0.117 m  SHUNTS Systemic VTI: 0.12 m  Jodelle Red MD Electronically signed by Jodelle Red MD Signature Date/Time: 02/20/2019/4:51:27 PM    Final       Scheduled Meds: .  stroke: mapping our early stages of recovery book   Does not apply Once  . aspirin  300 mg Rectal Daily  . budesonide  0.5 mg Nebulization BID  . digoxin  0.25 mg Intravenous Daily  . enoxaparin (LOVENOX) injection  40 mg Subcutaneous Q24H  . insulin aspart  0-5 Units Subcutaneous QHS  . insulin aspart  0-9 Units Subcutaneous TID WC  . insulin glargine  10 Units Subcutaneous Daily  . ipratropium-albuterol  3 mL Nebulization TID  . metoprolol tartrate  10 mg Intravenous Q6H   Continuous Infusions: . sodium chloride 100 mL/hr at 02/22/19 0609  .  cefTRIAXone (ROCEPHIN)  IV    . metronidazole    . valproate sodium 500 mg (02/22/19 0612)  . vancomycin Stopped (02/22/19 0233)     LOS: 3 days      Time spent: 25 minutes   Noralee Stain, DO [facility] 02/22/2019, 10:23 AM   Available via Epic secure chat 7am-7pm After these hours, please refer to coverage provider listed on [email or web address]

## 2019-02-22 NOTE — Progress Notes (Signed)
Modified Barium Swallow Progress Note  Patient Details  Name: Tyrone Cunningham MRN: [id number] Date of Birth: 11-Oct-1955  Today's Date: 02/22/2019  Modified Barium Swallow completed.  Full report located under Chart Review in the Imaging Section.  Brief recommendations include the following:  Clinical Impression  Pt participated in modified barium swallow - positioning and body habitus prevented adequate visualization of structures. PMV was worn during study. Pt presented with a moderate oral and pharyngeal dysphagia marked by poor oral control and organization, leading to diffuse spread of barium throughout oral cavity and passively into pharynx.  There was continual lingual rocking noted in an effort to propel bolus, often ineffectually.  There was aspiration of thin and nectar liquids before, during, and after the swallow.  Laryngeal vestibule closure was generally incomplete, with poor arytenoid to epiglottic approximation and inadequate epiglottic inversion (on fewer than 10% of occasions, epiglottis did completely invert).  Aspiration elicited a consistent cough response - it did not effectively clear aspirate from the trachea/larynx.  There was pharyngeal residue post-swallow.  No solids were given due to poor ability to clear liquids through pharynx.  For now, recommend continued NPO; consider cortrak.  Will direct therapeutic efforts at timing/strengthening/control of swallow.  Notified MD and RN re: results.    Swallow Evaluation Recommendations       SLP Diet Recommendations: NPO;Alternative means - temporary       Medication Administration: Via alternative means               Oral Care Recommendations: Oral care QID        [name] 02/22/2019,2:57 PM

## 2019-02-22 NOTE — Progress Notes (Signed)
  Speech Language Pathology Treatment: Nada Boozer Speaking valve;Dysphagia  Patient Details Name: Tyrone Cunningham MRN: [id number] DOB: Jul 03, 1955 Today's Date: 02/22/2019 Time: 7124-5809 SLP Time Calculation (min) (ACUTE ONLY): 27 min  Assessment / Plan / Recommendation Clinical Impression  Pt seen to address swallowing and PMV use.  Pt repositioned in bed to optimize participation.  Continued to demonstrate right CNVII asymmetry; significant tongue deviation to right (CN XII).  Difficulty following commands consistently. PMV placed; pt able to achieve wet, normal volume phonation.  Spontaneous speech noted to be severely dysarthric, pt required verbal cues to repeat in order to enhance intelligibility.  Vital signs remained stable throughout intermittent use of PMV (Sp02 90s, HR 100s, RR 26-30).  Pt requesting water.  Ice chips offered. Pt with delayed recognition of presence of spoon; able to achieve lip closure on left to retrieve ice.  Pt with consistent coughing after limited ice chip trials.  Recommend proceeding with MBS to assess impact of neuropathology on swallowing.  D/W RN. Scheduled for 1 pm today.   HPI HPI: Tyrone Cunningham is a 63 y.o. male with medical history significant of CHF, diabetes, chronic pain syndrome, was brought to ED via EMS from [facility] facility for evaluation of altered mental status. Per nursing home staff, pt appears to have right sided facial drooping, and right-sided weakness.  Last known normal was yesterday. He is trach dependent.  No previous ST notes in Epic.  Pt reported that he does not wear a PMV and usually finger occludes or mouths words to communicate.        SLP Plan  MBS       Recommendations  Diet recommendations: NPO      Patient may use Passy-Muir Speech Valve: Intermittently with supervision PMSV Supervision: Full         Oral Care Recommendations: Oral care QID Follow up Recommendations: Skilled Nursing facility Plan: [place]. Tivis Ringer, [place] CCC/SLP Acute Rehabilitation Services Office number [phone] Pager [phone]   [name] 02/22/2019, 11:35 AM

## 2019-02-22 NOTE — Progress Notes (Signed)
PHARMACY - PHYSICIAN COMMUNICATION CRITICAL VALUE ALERT - BLOOD CULTURE IDENTIFICATION (BCID)  Tyrone Cunningham is an 63 y.o. male who presented to [facility] on 02/19/2019 from [employer] facility for evaluation of altered mental status. He was started on empiric vancomycin and zosyn for aspiration PNA.  Assessment:  1/3 bottles positive for GPCCL, BCID will not be run  Name of physician (or Provider) Contacted: Dr. Maylene Roes  Current antibiotics: Vancomycin and Zosyn  Changes to prescribed antibiotics recommended:  Recommendations accepted by provider  Change to vancomycin/ceftriaxone/flagyl  Results for orders placed or performed during the hospital encounter of 10/09/17  Blood Culture ID Panel (Reflexed) (Collected: 10/09/2017  4:59 PM)  Result Value Ref Range   Enterococcus species NOT DETECTED NOT DETECTED   Listeria monocytogenes NOT DETECTED NOT DETECTED   Staphylococcus species DETECTED (A) NOT DETECTED   Staphylococcus aureus (BCID) NOT DETECTED NOT DETECTED   Methicillin resistance DETECTED (A) NOT DETECTED   Streptococcus species NOT DETECTED NOT DETECTED   Streptococcus agalactiae NOT DETECTED NOT DETECTED   Streptococcus pneumoniae NOT DETECTED NOT DETECTED   Streptococcus pyogenes NOT DETECTED NOT DETECTED   Acinetobacter baumannii NOT DETECTED NOT DETECTED   Enterobacteriaceae species NOT DETECTED NOT DETECTED   Enterobacter cloacae complex NOT DETECTED NOT DETECTED   Escherichia coli NOT DETECTED NOT DETECTED   Klebsiella oxytoca NOT DETECTED NOT DETECTED   Klebsiella pneumoniae NOT DETECTED NOT DETECTED   Proteus species NOT DETECTED NOT DETECTED   Serratia marcescens NOT DETECTED NOT DETECTED   Haemophilus influenzae NOT DETECTED NOT DETECTED   Neisseria meningitidis NOT DETECTED NOT DETECTED   Pseudomonas aeruginosa NOT DETECTED NOT DETECTED   Candida albicans NOT DETECTED NOT DETECTED   Candida glabrata NOT DETECTED NOT DETECTED   Candida krusei NOT DETECTED  NOT DETECTED   Candida parapsilosis NOT DETECTED NOT DETECTED   Candida tropicalis NOT DETECTED NOT DETECTED    Phillis Haggis 02/22/2019  9:26 AM

## 2019-02-22 NOTE — Plan of Care (Signed)
Patient progressing towards plan of care goals. 

## 2019-02-22 NOTE — Progress Notes (Signed)
PT Cancellation Note  Patient Details Name: Tyrone Cunningham MRN: [id number] DOB: 05-Oct-1955   Cancelled Treatment:    Reason Eval/Treat Not Completed: Patient at procedure or test/unavailable. At MRI, then MBSS. Will check back as time allows.   Leighton Roach, PT  Acute Rehab Services  Pager [phone] Office [place] 02/22/2019, 12:43 PM

## 2019-02-22 NOTE — Progress Notes (Signed)
Physician would like to be notified if MEWS change an acute change only.

## 2019-02-23 ENCOUNTER — Inpatient Hospital Stay (HOSPITAL_COMMUNITY): Payer: Medicare Other

## 2019-02-23 DIAGNOSIS — I5032 Chronic diastolic (congestive) heart failure: Secondary | ICD-10-CM

## 2019-02-23 DIAGNOSIS — Z930 Tracheostomy status: Secondary | ICD-10-CM

## 2019-02-23 LAB — BASIC METABOLIC PANEL
Anion gap: 11 (ref 5–15)
BUN: 22 mg/dL (ref 8–23)
CO2: 26 mmol/L (ref 22–32)
Calcium: 8.2 mg/dL — ABNORMAL LOW (ref 8.9–10.3)
Chloride: 106 mmol/L (ref 98–111)
Creatinine, Ser: 1.03 mg/dL (ref 0.61–1.24)
GFR calc Af Amer: >60 mL/min (ref >60)
GFR calc non Af Amer: >60 mL/min (ref >60)
Glucose, Bld: 115 mg/dL — ABNORMAL HIGH (ref 70–99)
Potassium: 3.4 mmol/L — ABNORMAL LOW (ref 3.5–5.1)
Sodium: 143 mmol/L (ref 135–145)

## 2019-02-23 LAB — GLUCOSE, CAPILLARY
Glucose-Capillary: 88 mg/dL (ref 70–99)
Glucose-Capillary: 73 mg/dL (ref 70–99)
Glucose-Capillary: 63 mg/dL — ABNORMAL LOW (ref 70–99)
Glucose-Capillary: 76 mg/dL (ref 70–99)
Glucose-Capillary: 98 mg/dL (ref 70–99)
Glucose-Capillary: 91 mg/dL (ref 70–99)
Glucose-Capillary: 129 mg/dL — ABNORMAL HIGH (ref 70–99)

## 2019-02-23 LAB — CBC
HCT: 39.4 % (ref 39.0–52.0)
Hemoglobin: 12.7 g/dL — ABNORMAL LOW (ref 13.0–17.0)
MCH: 30.1 pg (ref 26.0–34.0)
MCHC: 32.2 g/dL (ref 30.0–36.0)
MCV: 93.4 fL (ref 80.0–100.0)
Platelets: 188 10*3/uL (ref 150–400)
RBC: 4.22 MIL/uL (ref 4.22–5.81)
RDW: 13.3 % (ref 11.5–15.5)
WBC: 15.1 10*3/uL — ABNORMAL HIGH (ref 4.0–10.5)
nRBC: 0 % (ref 0.0–0.2)

## 2019-02-23 MED ORDER — OSMOLITE 1.5 CAL PO LIQD
1000.0000 mL | ORAL | Status: DC
  Administered 2019-02-23: 1000 mL
  Filled 2019-02-23 (×6): qty 1000

## 2019-02-23 MED ORDER — DEXTROSE 50 % IV SOLN
INTRAVENOUS | Status: AC
  Administered 2019-02-23: 50 mL
  Filled 2019-02-23: qty 50

## 2019-02-23 MED ORDER — JEVITY 1.2 CAL PO LIQD: 1000.0000 mL | ORAL | Status: DC

## 2019-02-23 MED ORDER — METOPROLOL TARTRATE 12.5 MG HALF TABLET
12.5000 mg | ORAL_TABLET | Freq: Four times a day (QID) | ORAL | Status: DC
  Administered 2019-02-23 – 2019-02-24 (×2): 12.5 mg
  Filled 2019-02-23 (×2): qty 1

## 2019-02-23 MED ORDER — PRO-STAT SUGAR FREE PO LIQD
30.0000 mL | Freq: Two times a day (BID) | ORAL | Status: DC
  Administered 2019-02-23 (×2): 30 mL
  Filled 2019-02-23: qty 30

## 2019-02-23 MED ORDER — METOPROLOL TARTRATE 12.5 MG HALF TABLET: 12.5000 mg | ORAL_TABLET | Freq: Four times a day (QID) | ORAL | Status: DC

## 2019-02-23 NOTE — TOC Initial Note (Signed)
Transition of Care [facility]) - Initial/Assessment Note    Patient Details  Name: Tyrone Cunningham MRN: [id number] Date of Birth: [birth year]  Transition of Care [facility]) CM/SW Contact:    Doy Hutching, LCSWA Phone Number: 02/23/2019, 1:49 PM  Clinical Narrative:                 [name] spoke with pt brother [name] via telephone [phone]). Pt from [facility] where he has been living for the past two years per brother. Pt was never married, no children, has three brothers, [name] being the main contact. [name] has questions about [facility] being able to provide pt with assistance with feeding etc if needed. We discussed that pt clinical information would be sent through the hub- if pt needs therapy and it is approved through pt insurance then [facility] should be able to provide those services as well as assistance with feeding. Pt brother also understands there may be ongoing goals of care discussions that the RN/MD staff may contact him in regards to moving forward depending on how the pt progresses. Pt brother has questions about MRI- referred him to RN and MD team to answer medical questions.   Will send referral through hub to [facility]. TOC team continuing to follow.   Expected Discharge Plan: Skilled Nursing Facility Barriers to Discharge: Continued Medical Work up   Patient Goals and CMS Choice Patient states their goals for this hospitalization and ongoing recovery are:: for him to return if they can provide him what he needs. CMS Medicare.gov Compare Post Acute Care list provided to:: Other (Comment Required)(brother [name]) Choice offered to / list presented to : Sibling  Expected Discharge Plan and Services Expected Discharge Plan: Skilled Nursing Facility In-house Referral: Clinical Social Work Discharge Planning Services: CM Consult Post Acute Care Choice: Resumption of Paediatric nurse, Skilled Nursing Facility, Nursing Home Living arrangements for the past 2 months: Skilled  Nursing Facility                 Prior Living Arrangements/Services Living arrangements for the past 2 months: Skilled Nursing Facility Lives with:: Facility Resident Patient language and need for interpreter reviewed:: Yes(no needs) Do you feel safe going back to the place where you live?: Yes      Need for Family Participation in Patient Care: Yes (Comment) Care giver support system in place?: Yes (comment)   Criminal Activity/Legal Involvement Pertinent to Current Situation/Hospitalization: No - Comment as needed  Permission Sought/Granted Permission sought to share information with : Facility Medical sales representative, Family Supports Permission granted to share information with : No(pt w/ fluctuating orientation currently person/place)  Share Information with NAME: Iosefa Weintraub  Permission granted to share info w AGENCY: [facility]  Permission granted to share info w Relationship: brother  Permission granted to share info w Contact Information: [phone]  Emotional Assessment Appearance:: Other (Comment Required(telephonic assessment with pt brother) Attitude/Demeanor/Rapport: (telephonic assessment with pt brother) Affect (typically observed): (telephonic assessment with pt brother) Orientation: : Oriented to Place, Oriented to Self Alcohol / Substance Use: Not Applicable Psych Involvement: No (comment)  Admission diagnosis:  SOB (shortness of breath) [R06.02] 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 HCAP (healthcare-associated pneumonia) 10/09/2017  . Hypokalemia 10/09/2017  . Abnormal liver function 10/09/2017  . Severe protein-calorie malnutrition (HCC) 10/09/2017  . Cellulitis and abscess of left leg 08/04/2017  .  Diabetic foot ulcer ([place]) 08/04/2017  . Acute renal failure (ARF) ([place]) 08/04/2017  . SOB  (shortness of breath) 09/13/2016  . Peripheral vascular disease ([place]) 08/19/2016  . Tracheostomy care ([place]) 06/28/2015  . Obstructive sleep apnea 06/28/2015  . Chronic pain syndrome 04/18/2015  . Constipation 01/07/2014  . Constipation, chronic 01/04/2014  . Chronic respiratory failure ([place]) 01/06/2013  . Edema 03/16/2012  . Obesity 10/20/2011  . DM (diabetes mellitus), type 2 with neurological complications ([place]) 49/75/3005  . 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 [name] with frequent exacerbations   . CHF (congestive heart failure) ([place])   . Hypertension   . Hypercholesteremia   . Hearing loss   . Diabetic neuropathy [facility])    PCP:  System, Pcp Not In Pharmacy:   [facility] [place], [place] - [address] [place] [address] Phone: [phone] Fax: [phone]  [place], [place] [address] [place] [address] Phone: [phone] Fax: [phone]   Readmission Risk Interventions Readmission Risk Prevention Plan 02/23/2019 10/15/2017  Transportation Screening Complete Complete  PCP or Specialist Appt within 5-7 Days Not Complete -  Not Complete comments pt is SNF resident -  Home Care Screening Not Complete Complete  Home Care Screening Not Completed Comments pt is SNF resident -  Medication Review (RN CM) Referral to [place] or [place] - Complete  Medication Review (RN Care Manager) - Complete  Some recent data might be hidden

## 2019-02-23 NOTE — Progress Notes (Signed)
Initial Nutrition Assessment  DOCUMENTATION CODES:   Morbid obesity  INTERVENTION:  Once NGT cleared for feeding use, Initiate Osmolite 1.5 formula @ 25 ml/hr and increase by 10 ml every 4 hours to goal rate of 55 ml/hr.   30 ml Prostat BID.    Tube feeding regimen provides 2180 kcal (100% of needs), 113 grams of protein, and 1003 ml of H2O.   NUTRITION DIAGNOSIS:   Inadequate oral intake related to inability to eat as evidenced by estimated needs.  GOAL:   Patient will meet greater than or equal to 90% of their needs  MONITOR:   Skin, TF tolerance, Labs, I & O's, Weight trends  REASON FOR ASSESSMENT:   Consult Enteral/tube feeding initiation and management  ASSESSMENT:   63 y.o. male with past medical history significant of chronic CHF, diabetes, chronic pain syndrome, chronic trach, was brought to ED via EMS from [facility] facility for evaluation of altered mental status.CT head: Findings consistent with age related atrophy and chronic small vessel ischemia. Findings compatible with subacute extension of left PCA infarct. Chronic infarct right PCA territory unchanged. Chronic left PICA infarct unchanged. Negative for acute hemorrhage.  Pt asleep during time of visit and did not awaken to RD visit. Pt underwent MBS yesterday. Per SLP, Pt with moderate oral and pharyngeal dysphagia marked by poor oral control and organization, leading to diffuse spread of barium throughout oral cavity and passively into pharynx. Pt is currently NPO. NGT placed today with plans to initiate enteral nutrition. Tube feeding recommendations stated above. RD to continue to monitor for tolerance.   NUTRITION - FOCUSED PHYSICAL EXAM:    Most Recent Value  Orbital Region  No depletion  Upper Arm Region  No depletion  Thoracic and Lumbar Region  No depletion  Buccal Region  No depletion  Temple Region  No depletion  Clavicle Bone Region  No depletion  Clavicle and Acromion Bone Region   No depletion  Scapular Bone Region  No depletion  Dorsal Hand  No depletion  Patellar Region  No depletion  Anterior Thigh Region  No depletion  Posterior Calf Region  No depletion  Edema (RD Assessment)  Mild  Hair  Reviewed  Eyes  Unable to assess  Mouth  Unable to assess  Skin  Reviewed  Nails  Unable to assess      Labs and medications reviewed.   Diet Order:   Diet Order            Diet NPO time specified  Diet effective now              EDUCATION NEEDS:   Not appropriate for education at this time  Skin:  Skin Assessment: Reviewed RN Assessment  Last BM:  12/21  Height:   Ht Readings from Last 1 Encounters:  02/19/19 5\' 7"  (1.702 m)    Weight:   Wt Readings from Last 1 Encounters:  02/19/19 122.5 kg    Ideal Body Weight:  67.27 kg  BMI:  Body mass index is 42.29 kg/m.  Estimated Nutritional Needs:   Kcal:  2000-2200  Protein:  105-125 grams  Fluid:  >/= 2 L/day    Corrin Parker, MS, RD, LDN Pager # [phone] After hours/ weekend pager # [phone]

## 2019-02-23 NOTE — Progress Notes (Signed)
PROGRESS NOTE    [name]  ZDG:[id number] DOB: [birth year] DOA: 02/19/2019 PCP: System, Pcp Not In   Brief Narrative: Tyrone Cunningham is a 63 y.o. malewith past medical history significant ofchronic CHF, diabetes, chronic pain syndrome,chronic trach. Patient presented secondary to altered mental status and found to have an acute stroke in addition to pneumonia.   Assessment & Plan:   Active Problems:   Hypertension   Tracheostomy dependent (HCC)   DM (diabetes mellitus), type 2 with neurological complications (HCC)   Stroke (cerebrum) (HCC)   Chronic diastolic CHF (congestive heart failure) (HCC)   Acute stroke Associated right sided hemiplegia and facial droop. 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 -Neurology recommendations: DAPT for 3 months followed by aspirin alone. Signed off  Sepsis Secondary to pneumonia. Not present on admission. Physiology resolved.  Right lower lobe pneumonia Patient empirically started on Vancomycin, Ceftriaxone and Flagyl.  Positive blood culture 1/4 bottles positive with GPC. -Follow culture -Continue Vancomycin  Essential hypertension Patient is on lisinopril and metoprolol as an outpatient. Held on admission for permissive hypertension. -Restart home metoprolol  Chronic diastolic heart failure Stable. -Continue Digoxin  Diabetes mellitus, type 2 Insulin dependent. Hemoglobin A1C of 8.3%. -Continue Lantus and SSI  OSA Chronic respiratory failure with hypercapnia Patient is s/p tracheostomy  Lung nodule Incidental finding. 4mm in size. Non-contrast CT in 12 months if high risk. Will need outpatient follow-up.   DVT prophylaxis: Lovenox Code Status:   Code Status: Full Code Family Communication: None Disposition Plan: Discharge pending culture data   Consultants:   Neurology  Procedures:   12/20: Transthoracic  Echocardiogram IMPRESSIONS    1. Left ventricular ejection fraction, by visual estimation, is 55 to 60%. The left ventricle has normal function. There is no left ventricular hypertrophy.  2. Definity contrast agent was given IV to delineate the left ventricular endocardial borders.  3. Left ventricular diastolic function could not be evaluated.  4. The left ventricle has no regional wall motion abnormalities.  5. Global right ventricle was not well visualized.The right ventricular size is not well visualized. Right vetricular wall thickness was not assessed.  6. Left atrial size was not well visualized.  7. Right atrial size was not well visualized.  8. The mitral valve was not well visualized. No evidence of mitral valve regurgitation.  9. The tricuspid valve is not well visualized. Tricuspid valve regurgitation is not demonstrated. 10. The aortic valve was not well visualized. Aortic valve regurgitation is not visualized. 11. The pulmonic valve was not well visualized. Pulmonic valve regurgitation is not visualized. 12. The aortic root was not well visualized. 13. Very technically difficult study, even with use of echo contrast. Contrast shows normal EF without significant wall motion abnormalities. Limited ability to assess any other features due to body habitus and limited echo windows. 14. The interatrial septum was not well visualized.  Antimicrobials:  Vancomycin  Cefepime  Flagyl    Subjective: Cannot tell me concerns secondary to trach  Objective: Vitals:   02/22/19 2339 02/23/19 0338 02/23/19 0354 02/23/19 0749  BP: 131/67  (!) 123/100   Pulse: (!) 102 90 87   Resp: 18 (!) 22 19   Temp: 98.1 F (36.7 C)  98.1 F (36.7 C) 98.3 F (36.8 C)  TempSrc: Oral  Oral Oral  SpO2: 96% 95% 96%   Weight:      Height:        Intake/Output Summary (  Last 24 hours) at 02/23/2019 0958 Last data filed at 02/23/2019 0800 Gross per 24 hour  Intake 2578.1 ml  Output 801 ml   Net 1777.1 ml   Filed Weights   02/19/19 1519  Weight: 122.5 kg    Examination:  General exam: Appears calm and comfortable  Respiratory system: Clear to auscultation. Respiratory effort normal. Cardiovascular system: S1 & S2 heard, RRR. No murmurs, rubs, gallops or clicks. Gastrointestinal system: Abdomen is nondistended, soft and nontender. No organomegaly or masses felt. Normal bowel sounds heard. Central nervous system: Alert. Right facial droop/hemiplegia Extremities: No edema. No calf tenderness Skin: No cyanosis. Ecchymosis noted on left arm Psychiatry: Difficult to assess secondary to trach    Data Reviewed: I have personally reviewed following labs and imaging studies  CBC: Recent Labs  Lab 02/19/19 1522 02/19/19 1527 02/20/19 0952 02/21/19 0407 02/22/19 0220 02/23/19 0247  WBC 15.7*  --  14.0* 19.7* 20.8* 15.1*  NEUTROABS 12.2*  --   --   --   --   --   HGB 14.6 15.0 13.4 13.3 13.0 12.7*  HCT 44.8 44.0 40.0 39.5 40.1 39.4  MCV 92.0  --  90.7 89.4 93.5 93.4  PLT 227  --  224 239 206 188   Basic Metabolic Panel: Recent Labs  Lab 02/19/19 1522 02/19/19 1527 02/20/19 0952 02/21/19 0407 02/23/19 0247  NA 137 136 139 142 143  K 5.1 4.9 4.0 4.1 3.4*  CL 99 102 104 105 106  CO2 27  --  GLUCOSE 205* 198* 131* 128* 115*  BUN 33* 35* 29* 22 22  CREATININE 1.38* 1.10 1.07 0.96 1.03  CALCIUM 9.3  --  8.9 8.8* 8.2*   GFR: Estimated Creatinine Clearance: 89.7 mL/min (by C-G formula based on SCr of 1.03 mg/dL). Liver Function Tests: Recent Labs  Lab 02/19/19 1522  AST 14*  ALT 17  ALKPHOS 82  BILITOT 0.3  PROT 7.0  ALBUMIN 3.0*   No results for input(s): LIPASE, AMYLASE in the last 168 hours. No results for input(s): AMMONIA in the last 168 hours. Coagulation Profile: Recent Labs  Lab 02/19/19 1522  INR 1.0   Cardiac Enzymes: No results for input(s): CKTOTAL, CKMB, CKMBINDEX, TROPONINI in the last 168 hours. BNP (last 3 results) No  results for input(s): PROBNP in the last 8760 hours. HbA1C: No results for input(s): HGBA1C in the last 72 hours. CBG: Recent Labs  Lab 02/22/19 1544 02/22/19 2123 02/22/19 2338 02/23/19 0447 02/23/19 0747  GLUCAP 135* 98 92 88 73   Lipid Profile: No results for input(s): CHOL, HDL, LDLCALC, TRIG, CHOLHDL, LDLDIRECT in the last 72 hours. Thyroid Function Tests: No results for input(s): TSH, T4TOTAL, FREET4, T3FREE, THYROIDAB in the last 72 hours. Anemia Panel: No results for input(s): VITAMINB12, FOLATE, FERRITIN, TIBC, IRON, RETICCTPCT in the last 72 hours. Sepsis Labs: No results for input(s): PROCALCITON, LATICACIDVEN in the last 168 hours.  Recent Results (from the past 240 hour(s))  SARS CORONAVIRUS 2 (TAT 6-24 HRS) Nasopharyngeal Nasopharyngeal Swab     Status: None   Collection Time: 02/19/19  3:44 PM   Specimen: Nasopharyngeal Swab  Result Value Ref Range Status   SARS Coronavirus 2 NEGATIVE NEGATIVE Final    Comment: (NOTE) SARS-CoV-2 target nucleic acids are NOT DETECTED. The SARS-CoV-2 RNA is generally detectable in upper and lower respiratory specimens during the acute phase of infection. Negative results do not preclude SARS-CoV-2 infection, do not rule out co-infections with other pathogens, and  should not be used as the sole basis for treatment or other patient management decisions. Negative results must be combined with clinical observations, patient history, and epidemiological information. The expected result is Negative. Fact Sheet for Patients: HairSlick.nohttps://www.fda.gov/media/138098/download Fact Sheet for Healthcare Providers: quierodirigir.comhttps://www.fda.gov/media/138095/download This test is not yet approved or cleared by the [place] FDA and  has been authorized for detection and/or diagnosis of SARS-CoV-2 by FDA under an Emergency Use Authorization (EUA). This EUA will remain  in effect (meaning this test can be used) for the duration of the COVID-19  declaration under Section 56 4(b)(1) of the Act, 21 U.S.C. section 360bbb-3(b)(1), unless the authorization is terminated or revoked sooner. Performed at [facility], [address] [place], [place] [address]   MRSA culture     Status: None   Collection Time: 02/20/19  1:50 AM  Result Value Ref Range Status   Specimen Description NASAL SWAB  Final   Special Requests NONE  Final   Culture   Final    MRSA DETECTED Performed at [facility], [address] [place], [place] [address]    Report Status 02/22/2019 FINAL  Final  Culture, blood (routine x 2)     Status: None (Preliminary result)   Collection Time: 02/21/19 10:30 AM   Specimen: BLOOD RIGHT HAND  Result Value Ref Range Status   Specimen Description BLOOD RIGHT HAND  Final   Special Requests   Final    BOTTLES DRAWN AEROBIC ONLY Blood Culture adequate volume   Culture   Final    NO GROWTH 2 DAYS Performed at [facility], [address] [place], [place] [address]    Report Status PENDING  Incomplete  Culture, blood (routine x 2)     Status: None (Preliminary result)   Collection Time: 02/21/19 10:38 AM   Specimen: BLOOD  Result Value Ref Range Status   Specimen Description BLOOD RIGHT ANTECUBITAL  Final   Special Requests   Final    BOTTLES DRAWN AEROBIC ONLY Blood Culture adequate volume   Culture  Setup Time   Final    GRAM POSITIVE COCCI IN CLUSTERS AEROBIC BOTTLE ONLY CRITICAL RESULT CALLED TO, READ BACK BY AND VERIFIED WITH: Gala Lewandowsky. BAUMEISTER PHARMD, AT 0926 02/22/19 BY D. VANHOOK    Culture   Final    GRAM POSITIVE COCCI CULTURE REINCUBATED FOR BETTER GROWTH Performed at [facility], [address] [place], [place] [address]    Report Status PENDING  Incomplete         Radiology Studies: MR BRAIN WO CONTRAST  Result Date: 02/22/2019 CLINICAL DATA:  Follow-up stroke. EXAM: MRI HEAD WITHOUT CONTRAST TECHNIQUE: Multiplanar, multiecho pulse sequences of the brain and surrounding  structures were obtained without intravenous contrast. COMPARISON:  CT head 02/21/2019 FINDINGS: Brain: Acute infarct left PCA territory involving the left hippocampus with extension into the left lower occipital lobe. This corresponds to the hypodensity on CT. Acute infarct in the left pons, 10 x 15 mm. Chronic infarct right occipital lobe. Generalized atrophy. Negative for hemorrhage. Vascular: Normal arterial flow voids Skull and upper cervical spine: Negative Sinuses/Orbits: Negative Other: Incomplete study.  Patient not able to initial sequences. IMPRESSION: Acute infarct left PCA territory involving the hippocampus and occipital lobe Acute infarct left pons. Chronic infarct right occipital lobe. Incomplete study, degraded by motion. Electronically Signed   By: Marlan Palauharles  Clark M.D.   On: 02/22/2019 14:48   DG CHEST PORT 1 VIEW  Result Date:  02/21/2019 CLINICAL DATA:  Altered mental status. History of bronchitis, congestive heart failure, COPD, diabetes, hypertension and sleep apnea. EXAM: PORTABLE CHEST 1 VIEW COMPARISON:  Radiographs 02/19/2019 and 10/19/2017. FINDINGS: 1159 hours. The tracheostomy appears unchanged. The heart size and mediastinal contours are stable with aortic atherosclerosis. Interval increased opacity at the right lung base which may reflect worsening atelectasis or developing infiltrate. Probable mild left basilar scarring is unchanged. There may be a small amount of pleural fluid on the right. No pneumothorax or edema. The bones appear stable. IMPRESSION: 1. Increased right basilar opacity may reflect worsening atelectasis or developing infiltrate. Possible small right pleural effusion. 2. No other significant changes. Electronically Signed   By: Richardean Sale M.D.   On: 02/21/2019 12:23   DG Swallowing Func-Speech Pathology  Result Date: 02/22/2019 Objective Swallowing Evaluation: Type of Study: MBS-Modified Barium Swallow Study  Patient Details Name: Tyrone Cunningham MRN:  [id number] Date of Birth: 07/21/1955 Today's Date: 02/22/2019 Time: SLP Start Time (ACUTE ONLY): 66 -SLP Stop Time (ACUTE ONLY): 1350 SLP Time Calculation (min) (ACUTE ONLY): 30 min Past Medical History: Past Medical History: Diagnosis Date  Bronchitis   CHF (congestive heart failure) (HCC)   grade 1 diastolic with preserved EF in 2017  COPD (chronic obstructive pulmonary disease) ([place])   Diabetes mellitus without complication ([place])   DM type 2 with diabetic peripheral neuropathy ([place])   Hearing loss   Hypercholesteremia   Hypertension   Peripheral neuropathy   Sleep apnea   Severe sleep apnea requiring tracheostomy Past Surgical History: Past Surgical History: Procedure Laterality Date  APPENDECTOMY    CHOLECYSTECTOMY N/A 01/05/2014  Procedure: LAPAROSCOPIC CHOLECYSTECTOMY;  Surgeon: Rolm Bookbinder, MD;  Location: [facility];  Service: General;  Laterality: N/A;  FEMUR HARDWARE REMOVAL  09/01/2003  Removal of retained hardware, two interlocking distal femoral  FEMUR SURGERY    HAND SURGERY    MULTIPLE EXTRACTIONS WITH ALVEOLOPLASTY N/A 05/11/2012  Procedure: MULTIPLE EXTRACTION WITH ALVEOLOPLASTY;  Surgeon: Gae Bon, DDS;  Location: [facility];  Service: Oral Surgery;  Laterality: N/A;  TRACHEOSTOMY   HPI: Selah Zelman is a 63 y.o. male with medical history significant of CHF, diabetes, chronic pain syndrome, was brought to ED via EMS from [facility] facility for evaluation of altered mental status. Per nursing home staff, pt appears to have right sided facial drooping, and right-sided weakness. 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  No previous ST notes in Epic.  Pt reported that he does not wear a PMV and usually finger occludes or mouths words to communicate.   Subjective: alert Assessment / Plan / Recommendation CHL IP CLINICAL IMPRESSIONS 02/22/2019 Clinical Impression Pt participated in modified barium swallow - positioning and body habitus  prevented adequate visualization of structures. PMV was worn during study. Pt presented with a moderate oral and pharyngeal dysphagia marked by poor oral control and organization, leading to diffuse spread of barium throughout oral cavity and passively into pharynx.  There was continual lingual rocking noted in an effort to propel bolus, often ineffectually.  There was aspiration of thin and nectar liquids before, during, and after the swallow.  Laryngeal vestibule closure was generally incomplete, with poor arytenoid to epiglottic approximation and inadequate epiglottic inversion (on fewer than 10% of occasions, epiglottis did completely invert).  Aspiration elicited a consistent cough response - it did not effectively clear aspirate from the trachea/larynx.  There was pharyngeal residue post-swallow.  No solids were given due to poor ability  to clear liquids through pharynx.  For now, recommend continued NPO; consider cortrak.  Will direct therapeutic efforts at timing/strengthening/control of swallow.  Notified MD and RN re: results.  SLP Visit Diagnosis Dysphagia, oropharyngeal phase (R13.12) Attention and concentration deficit following -- Frontal lobe and executive function deficit following -- Impact on safety and function Severe aspiration risk   CHL IP TREATMENT RECOMMENDATION 02/22/2019 Treatment Recommendations Therapy as outlined in treatment plan below   Prognosis 02/22/2019 Prognosis for Safe Diet Advancement Fair Barriers to Reach Goals Language deficits Barriers/Prognosis Comment -- CHL IP DIET RECOMMENDATION 02/22/2019 SLP Diet Recommendations NPO;Alternative means - temporary Liquid Administration via -- Medication Administration Via alternative means Compensations -- Postural Changes --   CHL IP OTHER RECOMMENDATIONS 02/22/2019 Recommended Consults -- Oral Care Recommendations Oral care QID Other Recommendations --   CHL IP FOLLOW UP RECOMMENDATIONS 02/22/2019 Follow up Recommendations Skilled  Nursing facility   [facility] IP FREQUENCY AND DURATION 02/22/2019 Speech Therapy Frequency (ACUTE ONLY) min 2x/week Treatment Duration 2 weeks      CHL IP ORAL PHASE 02/22/2019 Oral Phase Impaired Oral - Pudding Teaspoon -- Oral - Pudding Cup -- Oral - Honey Teaspoon -- Oral - Honey Cup -- Oral - Nectar Teaspoon Right anterior bolus loss;Weak lingual manipulation;Lingual pumping;Reduced posterior propulsion;Right pocketing in lateral sulci;Pocketing in anterior sulcus;Piecemeal swallowing;Decreased bolus cohesion Oral - Nectar Cup Right anterior bolus loss;Weak lingual manipulation;Lingual pumping;Reduced posterior propulsion;Right pocketing in lateral sulci;Pocketing in anterior sulcus;Piecemeal swallowing;Decreased bolus cohesion Oral - Nectar Straw -- Oral - Thin Teaspoon -- Oral - Thin Cup Right anterior bolus loss;Weak lingual manipulation;Lingual pumping;Reduced posterior propulsion;Right pocketing in lateral sulci;Pocketing in anterior sulcus;Piecemeal swallowing;Decreased bolus cohesion Oral - Thin Straw Right anterior bolus loss;Weak lingual manipulation;Lingual pumping;Reduced posterior propulsion;Right pocketing in lateral sulci;Pocketing in anterior sulcus;Piecemeal swallowing;Decreased bolus cohesion Oral - Puree -- Oral - Mech Soft -- Oral - Regular -- Oral - Multi-Consistency -- Oral - Pill -- Oral Phase - Comment --  CHL IP PHARYNGEAL PHASE 02/22/2019 Pharyngeal Phase Impaired Pharyngeal- Pudding Teaspoon -- Pharyngeal -- Pharyngeal- Pudding Cup -- Pharyngeal -- Pharyngeal- Honey Teaspoon -- Pharyngeal -- Pharyngeal- Honey Cup -- Pharyngeal -- Pharyngeal- Nectar Teaspoon -- Pharyngeal -- Pharyngeal- Nectar Cup Delayed swallow initiation-pyriform sinuses;Reduced pharyngeal peristalsis;Reduced epiglottic inversion;Reduced anterior laryngeal mobility;Reduced laryngeal elevation;Reduced airway/laryngeal closure;Reduced tongue base retraction;Penetration/Aspiration before swallow;Penetration/Aspiration during  swallow;Penetration/Apiration after swallow;Pharyngeal residue - valleculae Pharyngeal Material enters airway, passes BELOW cords and not ejected out despite cough attempt by patient Pharyngeal- Nectar Straw Delayed swallow initiation-pyriform sinuses;Reduced pharyngeal peristalsis;Reduced epiglottic inversion;Reduced anterior laryngeal mobility;Reduced laryngeal elevation;Reduced airway/laryngeal closure;Reduced tongue base retraction;Penetration/Aspiration before swallow;Penetration/Aspiration during swallow;Penetration/Apiration after swallow;Pharyngeal residue - valleculae Pharyngeal Material enters airway, passes BELOW cords and not ejected out despite cough attempt by patient Pharyngeal- Thin Teaspoon -- Pharyngeal -- Pharyngeal- Thin Cup Delayed swallow initiation-pyriform sinuses;Reduced pharyngeal peristalsis;Reduced epiglottic inversion;Reduced anterior laryngeal mobility;Reduced laryngeal elevation;Reduced airway/laryngeal closure;Reduced tongue base retraction;Penetration/Aspiration before swallow;Penetration/Aspiration during swallow;Penetration/Apiration after swallow;Pharyngeal residue - valleculae Pharyngeal Material enters airway, passes BELOW cords and not ejected out despite cough attempt by patient Pharyngeal- Thin Straw Delayed swallow initiation-pyriform sinuses;Reduced pharyngeal peristalsis;Reduced epiglottic inversion;Reduced anterior laryngeal mobility;Reduced laryngeal elevation;Reduced airway/laryngeal closure;Reduced tongue base retraction;Penetration/Aspiration before swallow;Penetration/Aspiration during swallow;Penetration/Apiration after swallow;Pharyngeal residue - valleculae Pharyngeal Material enters airway, passes BELOW cords and not ejected out despite cough attempt by patient Pharyngeal- Puree -- Pharyngeal -- Pharyngeal- Mechanical Soft -- Pharyngeal -- Pharyngeal- Regular -- Pharyngeal -- Pharyngeal- Multi-consistency -- Pharyngeal -- Pharyngeal- Pill -- Pharyngeal --  Pharyngeal Comment --  No flowsheet data found. Couture,  Marchelle Folks Laurice 02/22/2019, 2:58 PM                   Scheduled Meds:   stroke: mapping our early stages of recovery book   Does not apply Once   aspirin  300 mg Rectal Daily   budesonide  0.5 mg Nebulization BID   digoxin  0.25 mg Intravenous Daily   enoxaparin (LOVENOX) injection  60 mg Subcutaneous Q24H   insulin aspart  0-5 Units Subcutaneous QHS   insulin aspart  0-9 Units Subcutaneous TID WC   insulin glargine  10 Units Subcutaneous Daily   ipratropium-albuterol  3 mL Nebulization TID   metoprolol tartrate  10 mg Intravenous Q6H   Continuous Infusions:  sodium chloride 100 mL/hr at 02/23/19 0409   sodium chloride 10 mL/hr at 02/23/19 0409   cefTRIAXone (ROCEPHIN)  IV 2 g (02/22/19 1446)   fluconazole (DIFLUCAN) IV Stopped (02/22/19 1820)   metronidazole 500 mg (02/23/19 4098)   valproate sodium 500 mg (02/23/19 1191)   vancomycin Stopped (02/23/19 0211)     LOS: 4 days     Jacquelin Hawking, MD [facility] 02/23/2019, 9:58 AM  If 7PM-7AM, please contact night-coverage [email or web address]

## 2019-02-23 NOTE — Plan of Care (Signed)
Patient progressing towards plan of care goals. 

## 2019-02-23 NOTE — Social Work (Signed)
[name] left HIPAA compliant message for pt brother Jef Futch at [phone]. Pt from LTC at [facility]. Currently with [facility].   TOC team continuing to follow for support with disposition when medically appropriate.  Westley Hummer, MSW, [place] [employer] [phone]

## 2019-02-23 NOTE — Progress Notes (Signed)
Physical Therapy Treatment Patient Details Name: Tyrone Cunningham MRN: [id number] DOB: 02-07-1956 Today's Date: 02/23/2019    History of Present Illness Tyrone Cunningham is a 63 y.o. male with PMHx: chronic CHF, diabetes, chronic pain syndrome, chronic trach, was brought to ED via EMS from [facility] for AMS with R sided facial drooping, R-sided weakness. Pt is usually bedbound but can walk on occasion; Pt is trach dependent. 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 Incidental lung nodule found.    PT Comments    Pt remains unchanged from eval.  He is able to move his L side but remains limited on R side with flaccid UE.  He presents with R knee flexed but appear to achieve flexion with assistance from his L side. Pt remains to require max to total assistance to move in bed.  Attempted cervical rotation and he only performed x3 reps before quitting. Pt continues to require return to snf at this time.     Follow Up Recommendations  SNF     Equipment Recommendations  None recommended by PT    Recommendations for Other Services       Precautions / Restrictions Precautions Precautions: Fall;Other (comment) Precaution Comments: trach dependent Restrictions Weight Bearing Restrictions: No    Mobility  Bed Mobility Overal bed mobility: Needs Assistance Bed Mobility: Rolling Rolling: Max assist;Total assist         General bed mobility comments: Max to roll to the R and total to roll to the left.  Performed pad placement and repositioning to bed in a more seated position.  Transfers                    Ambulation/Gait                 Stairs             Wheelchair Mobility    Modified Rankin (Stroke Patients Only)       Balance Overall balance assessment: Needs assistance Sitting-balance support: Feet unsupported;Single extremity supported Sitting balance-Leahy Scale: Poor       Standing balance-Leahy Scale:  Fair                              Cognition Arousal/Alertness: Awake/alert Behavior During Therapy: Flat affect Overall Cognitive Status: No family/caregiver present to determine baseline cognitive functioning                                 General Comments: RN reports HOH but difficult to tell if he didnt understand or he didnt want to participate.      Exercises      General Comments        Pertinent Vitals/Pain Pain Assessment: Faces Faces Pain Scale: No hurt    Home Living                      Prior Function            PT Goals (current goals can now be found in the care plan section) Acute Rehab PT Goals Patient Stated Goal: to get stronger on R side Potential to Achieve Goals: Fair Progress towards PT goals: Progressing toward goals    Frequency    Min 2X/week      PT Plan Current plan remains appropriate  Co-evaluation              AM-PAC PT "6 Clicks" Mobility   Outcome Measure  Help needed turning from your back to your side while in a flat bed without using bedrails?: A Lot Help needed moving from lying on your back to sitting on the side of a flat bed without using bedrails?: Total Help needed moving to and from a bed to a chair (including a wheelchair)?: Total Help needed standing up from a chair using your arms (e.g., wheelchair or bedside chair)?: Total Help needed to walk in hospital room?: Total Help needed climbing 3-5 steps with a railing? : Total 6 Click Score: 7    End of Session Equipment Utilized During Treatment: Oxygen Activity Tolerance: Patient limited by fatigue;Patient limited by lethargy Patient left: in bed(rad tech into take xray, left message to place alarm and call bell with in reach before leaving.) Nurse Communication: Mobility status PT Visit Diagnosis: Hemiplegia and hemiparesis;Muscle weakness (generalized) (M62.81);Other abnormalities of gait and mobility  (R26.89) Hemiplegia - Right/Left: Right Hemiplegia - dominant/non-dominant: Non-dominant     Time: 1607-3710 PT Time Calculation (min) (ACUTE ONLY): 18 min  Charges:  $Therapeutic Activity: 8-22 mins                     Erasmo Leventhal , PTA Acute Rehabilitation Services Pager [phone] Office [phone]     Tyrone Cunningham 02/23/2019, 3:44 PM

## 2019-02-23 NOTE — Progress Notes (Signed)
  Speech Language Pathology Treatment: Dysphagia;Cognitive-Linquistic;Passy Muir Speaking valve  Patient Details Name: Tyrone Cunningham MRN: [id number] DOB: [birth year] Today's Date: 02/23/2019 Time: 1016-1040 SLP Time Calculation (min) (ACUTE ONLY): 24 min  Assessment / Plan / Recommendation Clinical Impression  PMV Pt tolerated PMV for 15 minutes with stable vital signs.  Pt was able to achieve adequate phonation, but was largely unintelligible.  Pt demonstrated ability to doff PMV at end of session.  Pt coughed intermittently throughout session, but was unable to expectorate orally even with use of suction for assistance.  SWALLOWING Introduced swallowing exercises.  Pt completed swallowing exercises as noted below. Pt exhibited fair to poor effort and poor accuracy with swallowing exercises.  Pt is presently without AMN, but with plans for NGT feeds.  RN has ordered a 10 french as cortrak is unavailable.  Briefly discussed AMN with pt who indicated that if his swallowing function does not improve, he would not want a PEG tube.  SLP provided oral care prior to administration of PO trials.  Ice chips were used with some swallow exercises.  There was wet vocal quality and frequent cough following ice chips. Pharyngeal swallow reflex could not be palpated.  Minimal epiglottic deflection noted on MBSS 02/22/19.  Swallowing exercises  Effortful swallow No of reps: 10 Comments: Fair-good effort; Poor accuracy.  Swallow reflex not palpated  CTAR No of reps: 2 attempts Comments: Poor effort and accuracy  [place] No of reps: 2 Comment: Poor effort, fair accuracy  Glottal closure No of reps: 3 Comment poor effort and accuracy  COGNITION-COMMUNICATION Pt declined specific, directed communication therapy.  Pt demonstrated ability to follow directions for swallow exercises, and was able to verbally express some needs/wants; for example, to decline therapy. Speech was noted to be moderately-severely  dysarthric.    HPI HPI: Tyrone Cunningham is a 63 y.o. male with medical history significant of CHF, diabetes, chronic pain syndrome, was [facility] ED [facility] of altered mental status. Per nursing home staff, pt appears to have right sided facial drooping, and right-sided weakness. 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  No previous ST notes in Epic.  Pt reported that he does not wear a PMV and usually finger occludes or mouths words to communicate.  MBS 02/22/19 with recs for NPO with AMN      SLP Plan  Continue with current plan of care       Recommendations  Diet recommendations: NPO Medication Administration: Via alternative means      Patient may use Passy-Muir Speech Valve: Intermittently with supervision PMSV Supervision: Full         Oral Care Recommendations: Oral care QID Follow up Recommendations: Skilled Nursing facility SLP Visit Diagnosis: Dysphagia, oropharyngeal phase (R13.12);Aphonia (R49.1) Plan: Continue with current plan of care       [place], [place], [place] Office: [phone] 02/23/2019, 10:55 AM

## 2019-02-24 ENCOUNTER — Inpatient Hospital Stay (HOSPITAL_COMMUNITY): Payer: Medicare Other

## 2019-02-24 LAB — GLUCOSE, CAPILLARY
Glucose-Capillary: 105 mg/dL — ABNORMAL HIGH (ref 70–99)
Glucose-Capillary: 107 mg/dL — ABNORMAL HIGH (ref 70–99)
Glucose-Capillary: 78 mg/dL (ref 70–99)
Glucose-Capillary: 81 mg/dL (ref 70–99)
Glucose-Capillary: 83 mg/dL (ref 70–99)
Glucose-Capillary: 85 mg/dL (ref 70–99)
Glucose-Capillary: 92 mg/dL (ref 70–99)

## 2019-02-24 MED ORDER — SODIUM CHLORIDE 0.9% FLUSH
10.0000 mL | Freq: Two times a day (BID) | INTRAVENOUS | Status: DC
  Administered 2019-02-24 – 2019-02-26 (×6): 10 mL
  Administered 2019-02-27: 20 mL
  Administered 2019-02-27: 10 mL
  Administered 2019-02-28: 20 mL
  Administered 2019-02-28: 10 mL
  Administered 2019-03-01: 20 mL
  Administered 2019-03-01 – 2019-03-13 (×23): 10 mL

## 2019-02-24 MED ORDER — VALPROIC ACID 250 MG/5ML PO SOLN
500.0000 mg | Freq: Three times a day (TID) | ORAL | Status: DC
  Filled 2019-02-24 (×3): qty 10

## 2019-02-24 MED ORDER — ASPIRIN 325 MG PO TABS: 325.0000 mg | ORAL_TABLET | Freq: Every day | ORAL | Status: DC

## 2019-02-24 MED ORDER — DIGOXIN 125 MCG PO TABS: 0.2500 mg | ORAL_TABLET | Freq: Every day | ORAL | Status: DC

## 2019-02-24 MED ORDER — SODIUM CHLORIDE 0.9% FLUSH: 10.0000 mL | INTRAVENOUS | Status: DC | PRN

## 2019-02-24 MED ORDER — VALPROATE SODIUM 500 MG/5ML IV SOLN
500.0000 mg | Freq: Three times a day (TID) | INTRAVENOUS | Status: DC
  Administered 2019-02-24 – 2019-03-03 (×20): 500 mg via INTRAVENOUS
  Filled 2019-02-24 (×23): qty 5

## 2019-02-24 NOTE — Progress Notes (Signed)
Pharmacy Antibiotic Note  Tyrone Cunningham is a 63 y.o. male admitted on 02/19/2019 due to stroke-like symptoms. Continues on Vancomycin, Ceftriaxone, Metronidazole for possible pneumonia Scr stable  BC with GPC 1/3  Vancomycin 1000 mg IV Q 12 hrs. Goal AUC 400-550. Expected AUC: 480 SCr used: 0.9   Plan: Continue Vancomycin 1 gram iv Q 12 hours  Culture data pending   Height: 5\' 7"  (170.2 cm) Weight: 270 lb (122.5 kg) IBW/kg (Calculated) : 66.1  Temp (24hrs), Avg:98.1 F (36.7 C), Min:97.5 F (36.4 C), Max:98.9 F (37.2 C)  Recent Labs  Lab 02/19/19 1522 02/19/19 1527 02/20/19 0952 02/21/19 0407 02/22/19 0220 02/23/19 0247  WBC 15.7*  --  14.0* 19.7* 20.8* 15.1*  CREATININE 1.38* 1.10 1.07 0.96  --  1.03    Estimated Creatinine Clearance: 89.7 mL/min (by C-G formula based on SCr of 1.03 mg/dL).    Thank you Anette Guarneri, PharmD [phone]  02/24/2019 8:25 AM

## 2019-02-24 NOTE — Plan of Care (Signed)
Patient progressing towards plan of care goals. 

## 2019-02-24 NOTE — Care Management Important Message (Signed)
Important Message  Patient Details  Name: Tyrone Cunningham MRN: [id number] Date of Birth: January 12, 1956   Medicare Important Message Given:  Yes     Adley Castello 02/24/2019, 11:25 AM

## 2019-02-24 NOTE — Progress Notes (Signed)
Physical Therapy Treatment Patient Details Name: Tyrone Cunningham MRN: [id number] DOB: October 10, 1955 Today's Date: 02/24/2019    History of Present Illness Bilbo Carcamo is a 63 y.o. male with PMHx: chronic CHF, diabetes, chronic pain syndrome, chronic trach, was brought to ED via EMS from [facility] for AMS with R sided facial drooping, R-sided weakness. Pt is usually bedbound but can walk on occasion; Pt is trach dependent. 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 Incidental lung nodule found.    PT Comments    Pt supine in bed on arrival with R lateral lean.  Performed rolling in bed and cleaned patient of bowel incontinence and placed new pads.  Pt with heavy secretions and positioned in chair position with propping on R side to better manage his secretions.  Pt continues to require return back to snf.  He performed very limited ROM and exercises this session.    Follow Up Recommendations  SNF     Equipment Recommendations  None recommended by PT    Recommendations for Other Services       Precautions / Restrictions Precautions Precautions: Fall;Other (comment) Precaution Comments: trach dependent Restrictions Weight Bearing Restrictions: No    Mobility  Bed Mobility Overal bed mobility: Needs Assistance Bed Mobility: Rolling Rolling: Max assist;Total assist;+2 for physical assistance         General bed mobility comments: Performed rolling to R and Left to clean patient of bowel incontinence.  Once rolled on his R side he resisted movement and attempted to push away.  +2 total to boost to head of bed.  Placed pillow on patients R side to prevent R lateral lean and R lateral bend of neck.  Transfers                 General transfer comment: deferred, pt will need lift for safety  Ambulation/Gait             General Gait Details: unable to ambulate   Stairs             Wheelchair Mobility    Modified  Rankin (Stroke Patients Only)       Balance Overall balance assessment: Needs assistance Sitting-balance support: Feet unsupported;Single extremity supported Sitting balance-Leahy Scale: Poor   Postural control: Right lateral lean   Standing balance-Leahy Scale: Fair                              Cognition Arousal/Alertness: Awake/alert Behavior During Therapy: Flat affect Overall Cognitive Status: No family/caregiver present to determine baseline cognitive functioning                                 General Comments: RN reports HOH but difficult to tell if he didnt understand or he didnt want to participate.      Exercises Other Exercises Other Exercises: PROM on RUE in all planes. Other Exercises: L APs 1x10 reps. Other Exercises: attempted L knee flexion and he only performed x2 reps before resisting.    General Comments        Pertinent Vitals/Pain Pain Assessment: Faces Faces Pain Scale: No hurt    Home Living                      Prior Function  PT Goals (current goals can now be found in the care plan section) Acute Rehab PT Goals Patient Stated Goal: to get stronger on R side PT Goal Formulation: With patient Potential to Achieve Goals: Fair Progress towards PT goals: Progressing toward goals    Frequency    Min 2X/week      PT Plan Current plan remains appropriate    Co-evaluation              AM-PAC PT "6 Clicks" Mobility   Outcome Measure  Help needed turning from your back to your side while in a flat bed without using bedrails?: A Lot Help needed moving from lying on your back to sitting on the side of a flat bed without using bedrails?: Total Help needed moving to and from a bed to a chair (including a wheelchair)?: Total Help needed standing up from a chair using your arms (e.g., wheelchair or bedside chair)?: Total Help needed to walk in hospital room?: Total Help needed climbing  3-5 steps with a railing? : Total 6 Click Score: 7    End of Session Equipment Utilized During Treatment: Oxygen Activity Tolerance: Patient limited by fatigue;Patient limited by lethargy Patient left: in bed;with call bell/phone within reach;with bed alarm set Nurse Communication: Mobility status PT Visit Diagnosis: Hemiplegia and hemiparesis;Muscle weakness (generalized) (M62.81);Other abnormalities of gait and mobility (R26.89) Hemiplegia - Right/Left: Right Hemiplegia - dominant/non-dominant: Non-dominant     Time: 1001-1019 PT Time Calculation (min) (ACUTE ONLY): 18 min  Charges:  $Therapeutic Activity: 8-22 mins                     Bonney Leitz , PTA Acute Rehabilitation Services Pager [phone] Office [phone]     Jakorey Mcconathy Artis Delay 02/24/2019, 1:22 PM

## 2019-02-24 NOTE — Progress Notes (Signed)
PROGRESS NOTE    Tyrone Cunningham  ZOX:[id number] DOB: 02/27/1956 DOA: 02/19/2019 PCP: System, Pcp Not In   Brief Narrative: Tyrone BaileyMark Nicholes is a 63 y.o. malewith past medical history significant ofchronic CHF, diabetes, chronic pain syndrome,chronic trach. Patient presented secondary to altered mental status and found to have an acute stroke in addition to pneumonia.   Assessment & Plan:   Active Problems:   Hypertension   Tracheostomy dependent (HCC)   DM (diabetes mellitus), type 2 with neurological complications (HCC)   Stroke (cerebrum) (HCC)   Chronic diastolic CHF (congestive heart failure) (HCC)   Acute stroke Associated right sided hemiplegia and facial droop. 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 -Neurology recommendations: DAPT for 3 months followed by aspirin alone. Signed off -PT/OT recommending SNF  Dysphagia In setting of stroke. Failed swallow study. Pulled out NG tube and does not want it reinserted. Discussed PEG tube for which he shrugged his shoulders -Palliative care consult -IV fluids for now  Sepsis Secondary to pneumonia. Not present on admission. Physiology resolved.  Right lower lobe pneumonia Patient empirically started on Vancomycin, Ceftriaxone and Flagyl.  Positive blood culture 1/4 bottles positive with GPC. -Follow culture (still pending) -Continue Vancomycin  Essential hypertension Patient is on lisinopril and metoprolol as an outpatient. Held on admission for permissive hypertension. -Continue home metoprolol  Chronic diastolic heart failure Stable. -Continue Digoxin  Diabetes mellitus, type 2 Insulin dependent. Hemoglobin A1C of 8.3%. -Continue Lantus and SSI  OSA Chronic respiratory failure with hypercapnia Patient is s/p tracheostomy  Lung nodule Incidental finding. 4mm in size. Non-contrast CT in 12 months if high risk. Will need outpatient  follow-up.   DVT prophylaxis: Lovenox Code Status:   Code Status: Full Code Family Communication: None Disposition Plan: Discharge pending culture data   Consultants:   Neurology  Procedures:   12/20: Transthoracic Echocardiogram IMPRESSIONS    1. Left ventricular ejection fraction, by visual estimation, is 55 to 60%. The left ventricle has normal function. There is no left ventricular hypertrophy.  2. Definity contrast agent was given IV to delineate the left ventricular endocardial borders.  3. Left ventricular diastolic function could not be evaluated.  4. The left ventricle has no regional wall motion abnormalities.  5. Global right ventricle was not well visualized.The right ventricular size is not well visualized. Right vetricular wall thickness was not assessed.  6. Left atrial size was not well visualized.  7. Right atrial size was not well visualized.  8. The mitral valve was not well visualized. No evidence of mitral valve regurgitation.  9. The tricuspid valve is not well visualized. Tricuspid valve regurgitation is not demonstrated. 10. The aortic valve was not well visualized. Aortic valve regurgitation is not visualized. 11. The pulmonic valve was not well visualized. Pulmonic valve regurgitation is not visualized. 12. The aortic root was not well visualized. 13. Very technically difficult study, even with use of echo contrast. Contrast shows normal EF without significant wall motion abnormalities. Limited ability to assess any other features due to body habitus and limited echo windows. 14. The interatrial septum was not well visualized.  Antimicrobials:  Vancomycin  Cefepime  Flagyl    Subjective: Does not voice/show concerns at this moment. Does not want NG tube  Objective: Vitals:   02/24/19 0325 02/24/19 0352 02/24/19 0733 02/24/19 0746  BP:  (!) 128/99 (!) 162/81   Pulse: 88 87 85   Resp: 18 19 19  Temp:  (!) 97.5 F (36.4 C) 98.4 F (36.9  C)   TempSrc:  Oral Oral   SpO2: 96% 96% 97% 97%  Weight:      Height:        Intake/Output Summary (Last 24 hours) at 02/24/2019 1053 Last data filed at 02/24/2019 0754 Gross per 24 hour  Intake 3050.15 ml  Output 600 ml  Net 2450.15 ml   Filed Weights   02/19/19 1519  Weight: 122.5 kg    Examination:  General exam: Appears calm and comfortable Respiratory system: Clear to auscultation. Respiratory effort normal. Cardiovascular system: S1 & S2 heard, RRR. No murmurs, rubs, gallops or clicks. Gastrointestinal system: Abdomen is nondistended, soft and nontender. No organomegaly or masses felt. Normal bowel sounds heard. Central nervous system: Alert. Right facial droop and hemiplegia Extremities: No edema. No calf tenderness Skin: No cyanosis. No rashes Psychiatry: Judgement and insight appear normal. Mood & affect appropriate.     Data Reviewed: I have personally reviewed following labs and imaging studies  CBC: Recent Labs  Lab 02/19/19 1522 02/19/19 1527 02/20/19 0952 02/21/19 0407 02/22/19 0220 02/23/19 0247  WBC 15.7*  --  14.0* 19.7* 20.8* 15.1*  NEUTROABS 12.2*  --   --   --   --   --   HGB 14.6 15.0 13.4 13.3 13.0 12.7*  HCT 44.8 44.0 40.0 39.5 40.1 39.4  MCV 92.0  --  90.7 89.4 93.5 93.4  PLT 227  --  224 239 206 419   Basic Metabolic Panel: Recent Labs  Lab 02/19/19 1522 02/19/19 1527 02/20/19 0952 02/21/19 0407 02/23/19 0247  NA 137 136 139 142 143  K 5.1 4.9 4.0 4.1 3.4*  CL 99 102 104 105 106  CO2 27  --  26 25 26   GLUCOSE 205* 198* 131* 128* 115*  BUN 33* 35* 29* 22 22  CREATININE 1.38* 1.10 1.07 0.96 1.03  CALCIUM 9.3  --  8.9 8.8* 8.2*   GFR: Estimated Creatinine Clearance: 89.7 mL/min (by C-G formula based on SCr of 1.03 mg/dL). Liver Function Tests: Recent Labs  Lab 02/19/19 1522  AST 14*  ALT 17  ALKPHOS 82  BILITOT 0.3  PROT 7.0  ALBUMIN 3.0*   No results for input(s): LIPASE, AMYLASE in the last 168 hours. No  results for input(s): AMMONIA in the last 168 hours. Coagulation Profile: Recent Labs  Lab 02/19/19 1522  INR 1.0   Cardiac Enzymes: No results for input(s): CKTOTAL, CKMB, CKMBINDEX, TROPONINI in the last 168 hours. BNP (last 3 results) No results for input(s): PROBNP in the last 8760 hours. HbA1C: No results for input(s): HGBA1C in the last 72 hours. CBG: Recent Labs  Lab 02/23/19 1935 02/23/19 2324 02/23/19 2333 02/24/19 0408 02/24/19 0805  GLUCAP 91 129* 107* 105* 85   Lipid Profile: No results for input(s): CHOL, HDL, LDLCALC, TRIG, CHOLHDL, LDLDIRECT in the last 72 hours. Thyroid Function Tests: No results for input(s): TSH, T4TOTAL, FREET4, T3FREE, THYROIDAB in the last 72 hours. Anemia Panel: No results for input(s): VITAMINB12, FOLATE, FERRITIN, TIBC, IRON, RETICCTPCT in the last 72 hours. Sepsis Labs: No results for input(s): PROCALCITON, LATICACIDVEN in the last 168 hours.  Recent Results (from the past 240 hour(s))  SARS CORONAVIRUS 2 (TAT 6-24 HRS) Nasopharyngeal Nasopharyngeal Swab     Status: None   Collection Time: 02/19/19  3:44 PM   Specimen: Nasopharyngeal Swab  Result Value Ref Range Status   SARS Coronavirus 2 NEGATIVE NEGATIVE Final  Comment: (NOTE) SARS-CoV-2 target nucleic acids are NOT DETECTED. The SARS-CoV-2 RNA is generally detectable in upper and lower respiratory specimens during the acute phase of infection. Negative results do not preclude SARS-CoV-2 infection, do not rule out co-infections with other pathogens, and should not be used as the sole basis for treatment or other patient management decisions. Negative results must be combined with clinical observations, patient history, and epidemiological information. The expected result is Negative. Fact Sheet for Patients: HairSlick.no Fact Sheet for Healthcare Providers: quierodirigir.com This test is not yet approved or cleared  by the [place] FDA and  has been authorized for detection and/or diagnosis of SARS-CoV-2 by FDA under an Emergency Use Authorization (EUA). This EUA will remain  in effect (meaning this test can be used) for the duration of the COVID-19 declaration under Section 56 4(b)(1) of the Act, 21 U.S.C. section 360bbb-3(b)(1), unless the authorization is terminated or revoked sooner. Performed at [facility], [address], [place], [place] [address]   MRSA culture     Status: None   Collection Time: 02/20/19  1:50 AM  Result Value Ref Range Status   Specimen Description NASAL SWAB  Final   Special Requests NONE  Final   Culture   Final    MRSA DETECTED Performed at [facility], [address], [place], [place] [address]    Report Status 02/22/2019 FINAL  Final  Culture, blood (routine x 2)     Status: None (Preliminary result)   Collection Time: 02/21/19 10:30 AM   Specimen: BLOOD RIGHT HAND  Result Value Ref Range Status   Specimen Description BLOOD RIGHT HAND  Final   Special Requests   Final    BOTTLES DRAWN AEROBIC ONLY Blood Culture adequate volume   Culture   Final    NO GROWTH 3 DAYS Performed at [facility], [address] [place], [place] [address]    Report Status PENDING  Incomplete  Culture, blood (routine x 2)     Status: None (Preliminary result)   Collection Time: 02/21/19 10:38 AM   Specimen: BLOOD  Result Value Ref Range Status   Specimen Description BLOOD RIGHT ANTECUBITAL  Final   Special Requests   Final    BOTTLES DRAWN AEROBIC ONLY Blood Culture adequate volume   Culture  Setup Time   Final    GRAM POSITIVE COCCI IN CLUSTERS AEROBIC BOTTLE ONLY CRITICAL RESULT CALLED TO, READ BACK BY AND VERIFIED WITH: Gala Lewandowsky PHARMD, AT 0926 02/22/19 BY D. VANHOOK    Culture   Final    GRAM POSITIVE COCCI IDENTIFICATION TO FOLLOW Performed at [facility], [address], [place], [place] [address]    Report Status PENDING   Incomplete         Radiology Studies: MR BRAIN WO CONTRAST  Result Date: 02/22/2019 CLINICAL DATA:  Follow-up stroke. EXAM: MRI HEAD WITHOUT CONTRAST TECHNIQUE: Multiplanar, multiecho pulse sequences of the brain and surrounding structures were obtained without intravenous contrast. COMPARISON:  CT head 02/21/2019 FINDINGS: Brain: Acute infarct left PCA territory involving the left hippocampus with extension into the left lower occipital lobe. This corresponds to the hypodensity on CT. Acute infarct in the left pons, 10 x 15 mm. Chronic infarct right occipital lobe. Generalized atrophy. Negative for hemorrhage. Vascular: Normal arterial flow voids Skull and upper cervical spine: Negative Sinuses/Orbits: Negative Other: Incomplete study.  Patient not able to initial sequences. IMPRESSION: Acute infarct left PCA territory involving the hippocampus  and occipital lobe Acute infarct left pons. Chronic infarct right occipital lobe. Incomplete study, degraded by motion. Electronically Signed   By: Marlan Palau M.D.   On: 02/22/2019 14:48   DG Abd Portable 1V  Result Date: 02/23/2019 CLINICAL DATA:  Nasogastric tube placement EXAM: PORTABLE ABDOMEN - 1 VIEW COMPARISON:  Portable exam 1709 hours compared to 1525 hours FINDINGS: Tip of nasogastric tube projects over stomach, proximal side-port near the GE junction. Volume loss in the RIGHT hemithorax with mediastinal shift to RIGHT. RIGHT pleural effusion and basilar atelectasis. LEFT lung base clear. IMPRESSION: Tip of nasogastric tube projects over proximal stomach with proximal side-port near the GE junction. Electronically Signed   By: Ulyses Southward M.D.   On: 02/23/2019 17:41   DG Abd Portable 1V  Result Date: 02/23/2019 CLINICAL DATA:  NG tube placement. EXAM: PORTABLE ABDOMEN - 1 VIEW COMPARISON:  Chest radiograph 02/21/2019 FINDINGS: The patient is rotated to the right. A new enteric tube is in place with tip in the expected region of the GE  junction or gastric cardia and side hole overlying the lower third of the thoracic esophagus. There is persistent right basilar lung opacity which may reflect a combination of airspace consolidation and pleural fluid, not grossly progressive from the previous chest radiograph. IMPRESSION: Enteric tube terminating in the region of the GE junction or gastric cardia. Advancement by 10 cm would ensure that the tip is well within the stomach and that the side hole is also in the stomach. These results will be called to the ordering clinician or representative by the Radiologist Assistant, and communication documented in the PACS or zVision Dashboard. Electronically Signed   By: Sebastian Ache M.D.   On: 02/23/2019 15:49   DG Swallowing Func-Speech Pathology  Result Date: 02/22/2019 Objective Swallowing Evaluation: Type of Study: MBS-Modified Barium Swallow Study  Patient Details Name: Demichael Traum MRN: [id number] Date of Birth: 1955/10/06 Today's Date: 02/22/2019 Time: SLP Start Time (ACUTE ONLY): 1320 -SLP Stop Time (ACUTE ONLY): 1350 SLP Time Calculation (min) (ACUTE ONLY): 30 min Past Medical History: Past Medical History: Diagnosis Date . Bronchitis  . CHF (congestive heart failure) (HCC)   grade 1 diastolic with preserved EF in 7829 . COPD (chronic obstructive pulmonary disease) (HCC)  . Diabetes mellitus without complication (HCC)  . DM type 2 with diabetic peripheral neuropathy (HCC)  . Hearing loss  . Hypercholesteremia  . Hypertension  . Peripheral neuropathy  . Sleep apnea   Severe sleep apnea requiring tracheostomy Past Surgical History: Past Surgical History: Procedure Laterality Date . APPENDECTOMY   . CHOLECYSTECTOMY N/A 01/05/2014  Procedure: LAPAROSCOPIC CHOLECYSTECTOMY;  Surgeon: Emelia Loron, MD;  Location: [facility] ORS;  Service: General;  Laterality: N/A; . FEMUR HARDWARE REMOVAL  09/01/2003  Removal of retained hardware, two interlocking distal femoral . FEMUR SURGERY   . HAND SURGERY   . MULTIPLE  EXTRACTIONS WITH ALVEOLOPLASTY N/A 05/11/2012  Procedure: MULTIPLE EXTRACTION WITH ALVEOLOPLASTY;  Surgeon: [name], DDS;  Location: [facility] OR;  Service: Oral Surgery;  Laterality: N/A; . TRACHEOSTOMY   HPI: Kha Hari is a 63 y.o. male with medical history significant of CHF, diabetes, chronic pain syndrome, was brought to ED via EMS from [facility] nursing facility for evaluation of altered mental status. Per nursing home staff, pt appears to have right sided facial drooping, and right-sided weakness. 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  No previous ST notes in Epic.  Pt reported that he does  not wear a PMV and usually finger occludes or mouths words to communicate.   Subjective: alert Assessment / Plan / Recommendation CHL IP CLINICAL IMPRESSIONS 02/22/2019 Clinical Impression Pt participated in modified barium swallow - positioning and body habitus prevented adequate visualization of structures. PMV was worn during study. Pt presented with a moderate oral and pharyngeal dysphagia marked by poor oral control and organization, leading to diffuse spread of barium throughout oral cavity and passively into pharynx.  There was continual lingual rocking noted in an effort to propel bolus, often ineffectually.  There was aspiration of thin and nectar liquids before, during, and after the swallow.  Laryngeal vestibule closure was generally incomplete, with poor arytenoid to epiglottic approximation and inadequate epiglottic inversion (on fewer than 10% of occasions, epiglottis did completely invert).  Aspiration elicited a consistent cough response - it did not effectively clear aspirate from the trachea/larynx.  There was pharyngeal residue post-swallow.  No solids were given due to poor ability to clear liquids through pharynx.  For now, recommend continued NPO; consider cortrak.  Will direct therapeutic efforts at timing/strengthening/control of swallow.  Notified MD and  RN re: results.  SLP Visit Diagnosis Dysphagia, oropharyngeal phase (R13.12) Attention and concentration deficit following -- Frontal lobe and executive function deficit following -- Impact on safety and function Severe aspiration risk   CHL IP TREATMENT RECOMMENDATION 02/22/2019 Treatment Recommendations Therapy as outlined in treatment plan below   Prognosis 02/22/2019 Prognosis for Safe Diet Advancement Fair Barriers to Reach Goals Language deficits Barriers/Prognosis Comment -- CHL IP DIET RECOMMENDATION 02/22/2019 SLP Diet Recommendations NPO;Alternative means - temporary Liquid Administration via -- Medication Administration Via alternative means Compensations -- Postural Changes --   CHL IP OTHER RECOMMENDATIONS 02/22/2019 Recommended Consults -- Oral Care Recommendations Oral care QID Other Recommendations --   CHL IP FOLLOW UP RECOMMENDATIONS 02/22/2019 Follow up Recommendations Skilled Nursing facility   [facility] IP FREQUENCY AND DURATION 02/22/2019 Speech Therapy Frequency (ACUTE ONLY) min 2x/week Treatment Duration 2 weeks      CHL IP ORAL PHASE 02/22/2019 Oral Phase Impaired Oral - Pudding Teaspoon -- Oral - Pudding Cup -- Oral - Honey Teaspoon -- Oral - Honey Cup -- Oral - Nectar Teaspoon Right anterior bolus loss;Weak lingual manipulation;Lingual pumping;Reduced posterior propulsion;Right pocketing in lateral sulci;Pocketing in anterior sulcus;Piecemeal swallowing;Decreased bolus cohesion Oral - Nectar Cup Right anterior bolus loss;Weak lingual manipulation;Lingual pumping;Reduced posterior propulsion;Right pocketing in lateral sulci;Pocketing in anterior sulcus;Piecemeal swallowing;Decreased bolus cohesion Oral - Nectar Straw -- Oral - Thin Teaspoon -- Oral - Thin Cup Right anterior bolus loss;Weak lingual manipulation;Lingual pumping;Reduced posterior propulsion;Right pocketing in lateral sulci;Pocketing in anterior sulcus;Piecemeal swallowing;Decreased bolus cohesion Oral - Thin Straw Right anterior  bolus loss;Weak lingual manipulation;Lingual pumping;Reduced posterior propulsion;Right pocketing in lateral sulci;Pocketing in anterior sulcus;Piecemeal swallowing;Decreased bolus cohesion Oral - Puree -- Oral - Mech Soft -- Oral - Regular -- Oral - Multi-Consistency -- Oral - Pill -- Oral Phase - Comment --  CHL IP PHARYNGEAL PHASE 02/22/2019 Pharyngeal Phase Impaired Pharyngeal- Pudding Teaspoon -- Pharyngeal -- Pharyngeal- Pudding Cup -- Pharyngeal -- Pharyngeal- Honey Teaspoon -- Pharyngeal -- Pharyngeal- Honey Cup -- Pharyngeal -- Pharyngeal- Nectar Teaspoon -- Pharyngeal -- Pharyngeal- Nectar Cup Delayed swallow initiation-pyriform sinuses;Reduced pharyngeal peristalsis;Reduced epiglottic inversion;Reduced anterior laryngeal mobility;Reduced laryngeal elevation;Reduced airway/laryngeal closure;Reduced tongue base retraction;Penetration/Aspiration before swallow;Penetration/Aspiration during swallow;Penetration/Apiration after swallow;Pharyngeal residue - valleculae Pharyngeal Material enters airway, passes BELOW cords and not ejected out despite cough attempt by patient Pharyngeal- Nectar Straw Delayed swallow initiation-pyriform sinuses;Reduced pharyngeal peristalsis;Reduced epiglottic inversion;Reduced anterior  laryngeal mobility;Reduced laryngeal elevation;Reduced airway/laryngeal closure;Reduced tongue base retraction;Penetration/Aspiration before swallow;Penetration/Aspiration during swallow;Penetration/Apiration after swallow;Pharyngeal residue - valleculae Pharyngeal Material enters airway, passes BELOW cords and not ejected out despite cough attempt by patient Pharyngeal- Thin Teaspoon -- Pharyngeal -- Pharyngeal- Thin Cup Delayed swallow initiation-pyriform sinuses;Reduced pharyngeal peristalsis;Reduced epiglottic inversion;Reduced anterior laryngeal mobility;Reduced laryngeal elevation;Reduced airway/laryngeal closure;Reduced tongue base retraction;Penetration/Aspiration before  swallow;Penetration/Aspiration during swallow;Penetration/Apiration after swallow;Pharyngeal residue - valleculae Pharyngeal Material enters airway, passes BELOW cords and not ejected out despite cough attempt by patient Pharyngeal- Thin Straw Delayed swallow initiation-pyriform sinuses;Reduced pharyngeal peristalsis;Reduced epiglottic inversion;Reduced anterior laryngeal mobility;Reduced laryngeal elevation;Reduced airway/laryngeal closure;Reduced tongue base retraction;Penetration/Aspiration before swallow;Penetration/Aspiration during swallow;Penetration/Apiration after swallow;Pharyngeal residue - valleculae Pharyngeal Material enters airway, passes BELOW cords and not ejected out despite cough attempt by patient Pharyngeal- Puree -- Pharyngeal -- Pharyngeal- Mechanical Soft -- Pharyngeal -- Pharyngeal- Regular -- Pharyngeal -- Pharyngeal- Multi-consistency -- Pharyngeal -- Pharyngeal- Pill -- Pharyngeal -- Pharyngeal Comment --  No flowsheet data found. Blenda Mounts Laurice 02/22/2019, 2:58 PM                   Scheduled Meds: .  stroke: mapping our early stages of recovery book   Does not apply Once  . aspirin  325 mg Per Tube Daily  . budesonide  0.5 mg Nebulization BID  . digoxin  0.25 mg Per Tube Daily  . enoxaparin (LOVENOX) injection  60 mg Subcutaneous Q24H  . feeding supplement (OSMOLITE 1.5 CAL)  1,000 mL Per Tube Q24H  . feeding supplement (PRO-STAT SUGAR FREE 64)  30 mL Per Tube BID  . insulin aspart  0-5 Units Subcutaneous QHS  . insulin aspart  0-9 Units Subcutaneous TID WC  . insulin glargine  10 Units Subcutaneous Daily  . ipratropium-albuterol  3 mL Nebulization TID  . metoprolol tartrate  12.5 mg Per Tube Q6H  . sodium chloride flush  10-40 mL Intracatheter Q12H  . valproic acid  500 mg Per Tube Q8H   Continuous Infusions: . sodium chloride 100 mL/hr at 02/24/19 0554  . sodium chloride 250 mL (02/24/19 0556)  . cefTRIAXone (ROCEPHIN)  IV 2 g (02/23/19 1239)  .  fluconazole (DIFLUCAN) IV 100 mg (02/23/19 1811)  . metronidazole 500 mg (02/24/19 0602)  . vancomycin Stopped (02/24/19 0205)     LOS: 5 days     Jacquelin Hawking, MD [facility] 02/24/2019, 10:53 AM  If 7PM-7AM, please contact night-coverage [email or web address]

## 2019-02-25 DIAGNOSIS — G8191 Hemiplegia, unspecified affecting right dominant side: Secondary | ICD-10-CM

## 2019-02-25 LAB — GLUCOSE, CAPILLARY
Glucose-Capillary: 103 mg/dL — ABNORMAL HIGH (ref 70–99)
Glucose-Capillary: 72 mg/dL (ref 70–99)
Glucose-Capillary: 74 mg/dL (ref 70–99)
Glucose-Capillary: 83 mg/dL (ref 70–99)
Glucose-Capillary: 86 mg/dL (ref 70–99)
Glucose-Capillary: 92 mg/dL (ref 70–99)
Glucose-Capillary: 98 mg/dL (ref 70–99)

## 2019-02-25 LAB — BLOOD GAS, ARTERIAL
Acid-Base Excess: 3.5 mmol/L — ABNORMAL HIGH (ref 0.0–2.0)
Bicarbonate: 27.9 mmol/L (ref 20.0–28.0)
FIO2: 28
O2 Saturation: 92.1 %
Patient temperature: 37
pCO2 arterial: 45.7 mmHg (ref 32.0–48.0)
pH, Arterial: 7.403 (ref 7.350–7.450)
pO2, Arterial: 62.2 mmHg — ABNORMAL LOW (ref 83.0–108.0)

## 2019-02-25 LAB — CULTURE, BLOOD (ROUTINE X 2): Special Requests: ADEQUATE

## 2019-02-25 MED ORDER — METOPROLOL TARTRATE 5 MG/5ML IV SOLN
2.5000 mg | Freq: Four times a day (QID) | INTRAVENOUS | Status: DC
  Administered 2019-02-25 – 2019-03-01 (×18): 2.5 mg via INTRAVENOUS
  Filled 2019-02-25 (×18): qty 5

## 2019-02-25 MED ORDER — ASPIRIN 300 MG RE SUPP
300.0000 mg | Freq: Every day | RECTAL | Status: DC
  Administered 2019-02-25 – 2019-02-28 (×4): 300 mg via RECTAL
  Filled 2019-02-25 (×4): qty 1

## 2019-02-25 MED ORDER — GLYCOPYRROLATE 0.2 MG/ML IJ SOLN
0.3000 mg | Freq: Two times a day (BID) | INTRAMUSCULAR | Status: DC
  Administered 2019-02-25 – 2019-02-28 (×6): 0.3 mg via INTRAVENOUS
  Filled 2019-02-25 (×6): qty 2

## 2019-02-25 MED ORDER — DIGOXIN 0.25 MG/ML IJ SOLN
0.2500 mg | Freq: Every day | INTRAMUSCULAR | Status: DC
  Administered 2019-02-25 – 2019-03-02 (×6): 0.25 mg via INTRAVENOUS
  Filled 2019-02-25 (×8): qty 1

## 2019-02-25 NOTE — Progress Notes (Signed)
Consult was placed to IV Team to check the midline that was placed last night;  Site wnl; able to flush with 20cc NS;  Pt on Many iv meds; attempted to place another peripheral iv for more access, using ultrasound, but pt was way too anxious, even with 2 staff members holding his left arm;  Left leg was also moving, coming off the bed;  Only has 1 midline at this time.

## 2019-02-25 NOTE — Consult Note (Addendum)
Consultation Note Date: 02/25/2019   Patient Name: Tyrone Cunningham  DOB: 07/12/1955  MRN: [id number]  Age / Sex: 63 y.o., male  PCP: System, Pcp Not In Referring Physician: Narda BondsNettey, Ralph A, MD  Reason for Consultation: Establishing goals of care  HPI/Patient Profile: 63 y.o. male  with past medical history of  COPD, chronic pain, OSA with chronic tracheostomy, HFpEF, DM, and hearing loss who was admitted on 02/19/2019 from SNF with altered mental status and right sided weakness.  He was found to have a stroke and pneumonia.  He has failed his swallow study and refuses a PEG tube.  Clinical Assessment and Goals of Care:  I have reviewed medical records including EPIC notes, labs and imaging, received report from the care team, assessed the patient and then spoke on the phone with his brother Tyrone Cunningham for approximately 40 min. to discuss diagnosis prognosis, GOC, EOL wishes, disposition and options.  I introduced Palliative Medicine as specialized medical care for people living with serious illness. It focuses on providing relief from the symptoms and stress of a serious illness. The goal is to improve quality of life for both the patient and the family.  We discussed a brief life review of the patient.  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  He has 3 brothers Geannie RisenVernon, Bill and [name].  [name] described his brother as being able to stand and transfer prior to his stroke.  He did have some difficulty with confusion and forgetfulness prior to his admission.  [facility].  [name] describes Tyrone Cunningham and being very stubborn once he makes a decision.  We discussed his current illness and what it means in the larger context of his on-going co-morbidities.  Natural disease trajectory and expectations at EOL were discussed.  [name] and I spoke about [name]'s right sided neglect and severe dysarthria.  Tyrone Cunningham has  pulled out his N/G and refused a PEG.  I explained that Tyrone Cunningham has aspiration pneumonia and that it will be recurrent.  Recurrent aspiration pneumonia is a terminal disease.  We discussed Tyrone Cunningham's refusal of a PEG.  [name] was concerned that if a PEG was placed Tyrone Cunningham would pull it out.  Code status was discussed in light of aspiration pneumonia and chronic trach.  No decisions were made.  [name] will talk with his brothers and we will discuss further tomorrow.  The brothers are trying to avoid visiting the hospital due to COVID, but I fear they will not truly understand Tyrone Cunningham's condition without seeing him in person.  I have offered to use face time to help them make an assessment.  [name] will talk with his brothers and 1 of them may come in for a face to face meeting on 12/26.  Questions and concerns were addressed. The family was encouraged to call with questions or concerns.    Primary Decision Maker:  NEXT OF KIN 3 brothers    SUMMARY OF RECOMMENDATIONS    PMT will continue to follow with you.  Further GOC conversation planned for 12/26.  Code Status/Advance Care Planning:  FULL   Symptom Management:   Will add robinul to help with secretions.  Additional Recommendations (Limitations, Scope, Preferences):  Full Scope Treatment  Palliative Prophylaxis:   Aspiration  Psycho-social/Spiritual:   Desire for further Chaplaincy support: not discussed.  Prognosis:  Concerned with chronic trach COPD, OSA and recurrent aspiration pneumonia in the setting of acute stroke.   Discharge Planning: To Be Determined Hospice House vs SNF      Primary Diagnoses: Present on Admission: . DM (diabetes mellitus), type 2 with neurological complications (HCC) . Hypertension   I have reviewed the medical record, interviewed the patient and family, and examined the patient. The following aspects are pertinent.  Past Medical History:  Diagnosis Date  . Bronchitis   . CHF (congestive heart  failure) (HCC)    grade 1 diastolic with preserved EF in 8466  . COPD (chronic obstructive pulmonary disease) (HCC)   . Diabetes mellitus without complication (HCC)   . DM type 2 with diabetic peripheral neuropathy (HCC)   . Hearing loss   . Hypercholesteremia   . Hypertension   . Peripheral neuropathy   . Sleep apnea    Severe sleep apnea requiring tracheostomy   Social History   Socioeconomic History  . Marital status: Single    Spouse name: Not on file  . Number of children: 0  . Years of education: Not on file  . Highest education level: GED or equivalent  Occupational History  . Occupation: disabled  Tobacco Use  . Smoking status: Former Smoker    Packs/day: 2.00    Years: 30.00    Pack years: 60.00    Types: Cigarettes    Quit date: 07/02/1999    Years since quitting: 19.6  . Smokeless tobacco: Never Used  Substance and Sexual Activity  . Alcohol use: No    Comment: heavy drinker, quit in 1983  . Drug use: No    Comment: used "everything but heroin, I had my days"  . Sexual activity: Never  Other Topics Concern  . Not on file  Social History Narrative   Lives with roommate in a hotel.   In wheelchair - minimal walking   Social Determinants of Health   Financial Resource Strain:   . Difficulty of Paying Living Expenses: Not on file  Food Insecurity:   . Worried About Programme researcher, broadcasting/film/video in the Last Year: Not on file  . Ran Out of Food in the Last Year: Not on file  Transportation Needs:   . Lack of Transportation (Medical): Not on file  . Lack of Transportation (Non-Medical): Not on file  Physical Activity:   . Days of Exercise per Week: Not on file  . Minutes of Exercise per Session: Not on file  Stress:   . Feeling of Stress : Not on file  Social Connections:   . Frequency of Communication with Friends and Family: Not on file  . Frequency of Social Gatherings with Friends and Family: Not on file  . Attends Religious Services: Not on file  . Active  Member of Clubs or Organizations: Not on file  . Attends Banker Meetings: Not on file  . Marital Status: Not on file   Family History  Problem Relation Age of Onset  . Coronary artery disease Father    Scheduled Meds: .  stroke: mapping our early stages of recovery book   Does not apply Once  .  aspirin  300 mg Rectal Daily  . budesonide  0.5 mg Nebulization BID  . digoxin  0.25 mg Intravenous Daily  . enoxaparin (LOVENOX) injection  60 mg Subcutaneous Q24H  . feeding supplement (OSMOLITE 1.5 CAL)  1,000 mL Per Tube Q24H  . feeding supplement (PRO-STAT SUGAR FREE 64)  30 mL Per Tube BID  . insulin aspart  0-5 Units Subcutaneous QHS  . insulin aspart  0-9 Units Subcutaneous TID WC  . insulin glargine  10 Units Subcutaneous Daily  . ipratropium-albuterol  3 mL Nebulization TID  . metoprolol tartrate  2.5 mg Intravenous Q6H  . sodium chloride flush  10-40 mL Intracatheter Q12H   Continuous Infusions: . sodium chloride 100 mL/hr at 02/24/19 0554  . sodium chloride 250 mL (02/24/19 0556)  . cefTRIAXone (ROCEPHIN)  IV 2 g (02/25/19 1414)  . fluconazole (DIFLUCAN) IV 100 mg (02/25/19 1755)  . metronidazole 500 mg (02/25/19 1640)  . valproate sodium 500 mg (02/25/19 1452)   PRN Meds:.sodium chloride, acetaminophen **OR** acetaminophen (TYLENOL) oral liquid 160 mg/5 mL **OR** acetaminophen, albuterol, haloperidol lactate, LORazepam, sodium chloride flush No Known Allergies Review of Systems patient unable to provide  Physical Exam  Chronically ill appearing gentleman, awake, tracheostomy in place CV unable to hear over lung sounds Resp:  No distress, sounds wet Neuro:  Patient with right sided neglect, facing left, severe dysarthria,  Shrugs his shoulders in response to our questions.  Vital Signs: BP (!) 162/73 (BP Location: Right Arm)   Pulse (!) 103   Temp 99 F (37.2 C) (Axillary)   Resp 20   Ht 5\' 7"  (1.702 m)   Wt 122.5 kg   SpO2 94%   BMI 42.29 kg/m  Pain  Scale: 0-10   Pain Score: 0-No pain   SpO2: SpO2: 94 % O2 Device:SpO2: 94 % O2 Flow Rate: .O2 Flow Rate (L/min): 5 L/min  IO: Intake/output summary:   Intake/Output Summary (Last 24 hours) at 02/25/2019 1926 Last data filed at 02/25/2019 1629 Gross per 24 hour  Intake 2254.22 ml  Output 300 ml  Net 1954.22 ml    LBM: Last BM Date: 02/25/19 Baseline Weight: Weight: 122.5 kg Most recent weight: Weight: 122.5 kg     Palliative Assessment/Data: 10%     Time In: 3:00 Time Out: 4:10 Time Total: 70 min. Visit consisted of counseling and education dealing with the complex and emotionally intense issues surrounding the need for palliative care and symptom management in the setting of serious and potentially life-threatening illness. Greater than 50%  of this time was spent counseling and coordinating care related to the above assessment and plan.  Signed by: Florentina Jenny, PA-C Palliative Medicine Pager: [phone]  Please contact Palliative Medicine Team phone at [phone] for questions and concerns.  For individual provider: See Shea Evans

## 2019-02-25 NOTE — Progress Notes (Signed)
PROGRESS NOTE    Tyrone Cunningham  LHT:[id number] DOB: 30-Sep-1955 DOA: 02/19/2019 PCP: System, Pcp Not In   Brief Narrative: Tyrone Cunningham is a 63 y.o. malewith past medical history significant ofchronic CHF, diabetes, chronic pain syndrome,chronic trach. Patient presented secondary to altered mental status and found to have an acute stroke in addition to pneumonia.   Assessment & Plan:   Active Problems:   Hypertension   Tracheostomy dependent (HCC)   DM (diabetes mellitus), type 2 with neurological complications (HCC)   Stroke (cerebrum) (HCC)   Chronic diastolic CHF (congestive heart failure) (HCC)   Acute stroke Associated right sided hemiplegia and facial droop. 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 -Neurology recommendations: DAPT for 3 months followed by aspirin alone. Signed off -PT/OT recommending SNF  Dysphagia In setting of stroke. Failed swallow study. Pulled out NG tube and does not want it reinserted. Discussed PEG tube for which he shrugged his shoulders -Palliative care consult pending -IV fluids for now  Confusion Patient seems slightly confused this morning. More agitated. Requiring increased oxygen overnight. -ABG  Sepsis Secondary to pneumonia. Not present on admission. Physiology resolved.  Right lower lobe pneumonia Patient empirically started on Vancomycin, Ceftriaxone and Flagyl.  Positive blood culture 1/4 bottles positive with GPC now found to be coagulase negative staph species. Likely contamination. -Will discontinue Vancomycin  Essential hypertension Patient is on lisinopril and metoprolol as an outpatient. Held on admission for permissive hypertension. -Continue home metoprolol  Chronic diastolic heart failure Stable. -Continue Digoxin  Diabetes mellitus, type 2 Insulin dependent. Hemoglobin A1C of 8.3%. -Continue Lantus and SSI  OSA Chronic respiratory  failure with hypercapnia Patient is s/p tracheostomy  Lung nodule Incidental finding. 31mm in size. Non-contrast CT in 12 months if high risk. Will need outpatient follow-up.   DVT prophylaxis: Lovenox Code Status:   Code Status: Full Code Family Communication: None Disposition Plan: Discharge pending goals of care discussions   Consultants:   Neurology  Procedures:   12/20: Transthoracic Echocardiogram IMPRESSIONS    1. Left ventricular ejection fraction, by visual estimation, is 55 to 60%. The left ventricle has normal function. There is no left ventricular hypertrophy.  2. Definity contrast agent was given IV to delineate the left ventricular endocardial borders.  3. Left ventricular diastolic function could not be evaluated.  4. The left ventricle has no regional wall motion abnormalities.  5. Global right ventricle was not well visualized.The right ventricular size is not well visualized. Right vetricular wall thickness was not assessed.  6. Left atrial size was not well visualized.  7. Right atrial size was not well visualized.  8. The mitral valve was not well visualized. No evidence of mitral valve regurgitation.  9. The tricuspid valve is not well visualized. Tricuspid valve regurgitation is not demonstrated. 10. The aortic valve was not well visualized. Aortic valve regurgitation is not visualized. 11. The pulmonic valve was not well visualized. Pulmonic valve regurgitation is not visualized. 12. The aortic root was not well visualized. 13. Very technically difficult study, even with use of echo contrast. Contrast shows normal EF without significant wall motion abnormalities. Limited ability to assess any other features due to body habitus and limited echo windows. 14. The interatrial septum was not well visualized.  Antimicrobials:  Vancomycin  Cefepime  Flagyl    Subjective: Agitated today. No responding to my questions. Batting me  away.  Objective: Vitals:   02/25/19 0810 02/25/19 0830  02/25/19 1211 02/25/19 1245  BP: (!) 151/65  (!) 145/66   Pulse: 95 96 90   Resp: 20 20 (!) 21   Temp: (!) 97 F (36.1 C)  (!) 96.6 F (35.9 C) 97.8 F (36.6 C)  TempSrc: Axillary  Axillary   SpO2: 95% 95% 95%   Weight:      Height:        Intake/Output Summary (Last 24 hours) at 02/25/2019 1414 Last data filed at 02/24/2019 1700 Gross per 24 hour  Intake 5.5 ml  Output --  Net 5.5 ml   Filed Weights   02/19/19 1519  Weight: 122.5 kg    Examination:  General exam: Appears calm and comfortable Respiratory system: Clear to auscultation. Respiratory effort normal. Cardiovascular system: S1 & S2 heard, RRR. No murmurs, rubs, gallops or clicks. Gastrointestinal system: Abdomen is nondistended, soft and nontender. No organomegaly or masses felt. Normal bowel sounds heard. Central nervous system: Somnolent but arouses with tactile stimulation. Extremities: No edema. No calf tenderness Skin: No cyanosis. No rashes Psychiatry: Judgement and insight appear impaired.    Data Reviewed: I have personally reviewed following labs and imaging studies  CBC: Recent Labs  Lab 02/19/19 1522 02/19/19 1527 02/20/19 0952 02/21/19 0407 02/22/19 0220 02/23/19 0247  WBC 15.7*  --  14.0* 19.7* 20.8* 15.1*  NEUTROABS 12.2*  --   --   --   --   --   HGB 14.6 15.0 13.4 13.3 13.0 12.7*  HCT 44.8 44.0 40.0 39.5 40.1 39.4  MCV 92.0  --  90.7 89.4 93.5 93.4  PLT 227  --  224 239 206 024   Basic Metabolic Panel: Recent Labs  Lab 02/19/19 1522 02/19/19 1527 02/20/19 0952 02/21/19 0407 02/23/19 0247  NA 137 136 139 142 143  K 5.1 4.9 4.0 4.1 3.4*  CL 99 102 104 105 106  CO2 27  --  26 25 26   GLUCOSE 205* 198* 131* 128* 115*  BUN 33* 35* 29* 22 22  CREATININE 1.38* 1.10 1.07 0.96 1.03  CALCIUM 9.3  --  8.9 8.8* 8.2*   GFR: Estimated Creatinine Clearance: 89.7 mL/min (by C-G formula based on SCr of 1.03 mg/dL). Liver  Function Tests: Recent Labs  Lab 02/19/19 1522  AST 14*  ALT 17  ALKPHOS 82  BILITOT 0.3  PROT 7.0  ALBUMIN 3.0*   No results for input(s): LIPASE, AMYLASE in the last 168 hours. No results for input(s): AMMONIA in the last 168 hours. Coagulation Profile: Recent Labs  Lab 02/19/19 1522  INR 1.0   Cardiac Enzymes: No results for input(s): CKTOTAL, CKMB, CKMBINDEX, TROPONINI in the last 168 hours. BNP (last 3 results) No results for input(s): PROBNP in the last 8760 hours. HbA1C: No results for input(s): HGBA1C in the last 72 hours. CBG: Recent Labs  Lab 02/24/19 2344 02/25/19 0419 02/25/19 0611 02/25/19 0846 02/25/19 1210  GLUCAP 81 103* 98 74 72   Lipid Profile: No results for input(s): CHOL, HDL, LDLCALC, TRIG, CHOLHDL, LDLDIRECT in the last 72 hours. Thyroid Function Tests: No results for input(s): TSH, T4TOTAL, FREET4, T3FREE, THYROIDAB in the last 72 hours. Anemia Panel: No results for input(s): VITAMINB12, FOLATE, FERRITIN, TIBC, IRON, RETICCTPCT in the last 72 hours. Sepsis Labs: No results for input(s): PROCALCITON, LATICACIDVEN in the last 168 hours.  Recent Results (from the past 240 hour(s))  SARS CORONAVIRUS 2 (TAT 6-24 HRS) Nasopharyngeal Nasopharyngeal Swab     Status: None   Collection Time: 02/19/19  3:44  PM   Specimen: Nasopharyngeal Swab  Result Value Ref Range Status   SARS Coronavirus 2 NEGATIVE NEGATIVE Final    Comment: (NOTE) SARS-CoV-2 target nucleic acids are NOT DETECTED. The SARS-CoV-2 RNA is generally detectable in upper and lower respiratory specimens during the acute phase of infection. Negative results do not preclude SARS-CoV-2 infection, do not rule out co-infections with other pathogens, and should not be used as the sole basis for treatment or other patient management decisions. Negative results must be combined with clinical observations, patient history, and epidemiological information. The expected result is  Negative. Fact Sheet for Patients: HairSlick.no Fact Sheet for Healthcare Providers: quierodirigir.com This test is not yet approved or cleared by the [place] FDA and  has been authorized for detection and/or diagnosis of SARS-CoV-2 by FDA under an Emergency Use Authorization (EUA). This EUA will remain  in effect (meaning this test can be used) for the duration of the COVID-19 declaration under Section 56 4(b)(1) of the Act, 21 U.S.C. section 360bbb-3(b)(1), unless the authorization is terminated or revoked sooner. Performed at [facility], [address], [place], [place] [address]   MRSA culture     Status: None   Collection Time: 02/20/19  1:50 AM  Result Value Ref Range Status   Specimen Description NASAL SWAB  Final   Special Requests NONE  Final   Culture   Final    MRSA DETECTED Performed at [facility], [address] [place], [place] [address]    Report Status 02/22/2019 FINAL  Final  Culture, blood (routine x 2)     Status: None (Preliminary result)   Collection Time: 02/21/19 10:30 AM   Specimen: BLOOD RIGHT HAND  Result Value Ref Range Status   Specimen Description BLOOD RIGHT HAND  Final   Special Requests   Final    BOTTLES DRAWN AEROBIC ONLY Blood Culture adequate volume   Culture   Final    NO GROWTH 4 DAYS Performed at [facility], [address] [place], [place] [address]    Report Status PENDING  Incomplete  Culture, blood (routine x 2)     Status: Abnormal   Collection Time: 02/21/19 10:38 AM   Specimen: BLOOD  Result Value Ref Range Status   Specimen Description BLOOD RIGHT ANTECUBITAL  Final   Special Requests   Final    BOTTLES DRAWN AEROBIC ONLY Blood Culture adequate volume   Culture  Setup Time   Final    GRAM POSITIVE COCCI IN CLUSTERS AEROBIC BOTTLE ONLY CRITICAL RESULT CALLED TO, READ BACK BY AND VERIFIED WITH: TRubin Payor PHARMD, AT 9147 02/22/19 BY D.  VANHOOK    Culture (A)  Final    STAPHYLOCOCCUS SPECIES (COAGULASE NEGATIVE) THE SIGNIFICANCE OF ISOLATING THIS ORGANISM FROM A SINGLE SET OF BLOOD CULTURES WHEN MULTIPLE SETS ARE DRAWN IS UNCERTAIN. PLEASE NOTIFY THE MICROBIOLOGY DEPARTMENT WITHIN ONE WEEK IF SPECIATION AND SENSITIVITIES ARE REQUIRED. Performed at [facility], [address], [place], [place] [address]    Report Status 02/25/2019 FINAL  Final         Radiology Studies: DG Abd Portable 1V  Result Date: 02/23/2019 CLINICAL DATA:  Nasogastric tube placement EXAM: PORTABLE ABDOMEN - 1 VIEW COMPARISON:  Portable exam 1709 hours compared to 1525 hours FINDINGS: Tip of nasogastric tube projects over stomach, proximal side-port near the GE junction. Volume loss in the RIGHT hemithorax with mediastinal shift to RIGHT. RIGHT pleural effusion and basilar atelectasis. LEFT lung  base clear. IMPRESSION: Tip of nasogastric tube projects over proximal stomach with proximal side-port near the GE junction. Electronically Signed   By: Ulyses SouthwardMark  Boles M.D.   On: 02/23/2019 17:41   DG Abd Portable 1V  Result Date: 02/23/2019 CLINICAL DATA:  NG tube placement. EXAM: PORTABLE ABDOMEN - 1 VIEW COMPARISON:  Chest radiograph 02/21/2019 FINDINGS: The patient is rotated to the right. A new enteric tube is in place with tip in the expected region of the GE junction or gastric cardia and side hole overlying the lower third of the thoracic esophagus. There is persistent right basilar lung opacity which may reflect a combination of airspace consolidation and pleural fluid, not grossly progressive from the previous chest radiograph. IMPRESSION: Enteric tube terminating in the region of the GE junction or gastric cardia. Advancement by 10 cm would ensure that the tip is well within the stomach and that the side hole is also in the stomach. These results will be called to the ordering clinician or representative by the Radiologist Assistant, and  communication documented in the PACS or zVision Dashboard. Electronically Signed   By: Sebastian AcheAllen  Grady M.D.   On: 02/23/2019 15:49        Scheduled Meds: .  stroke: mapping our early stages of recovery book   Does not apply Once  . aspirin  300 mg Rectal Daily  . budesonide  0.5 mg Nebulization BID  . digoxin  0.25 mg Intravenous Daily  . enoxaparin (LOVENOX) injection  60 mg Subcutaneous Q24H  . feeding supplement (OSMOLITE 1.5 CAL)  1,000 mL Per Tube Q24H  . feeding supplement (PRO-STAT SUGAR FREE 64)  30 mL Per Tube BID  . insulin aspart  0-5 Units Subcutaneous QHS  . insulin aspart  0-9 Units Subcutaneous TID WC  . insulin glargine  10 Units Subcutaneous Daily  . ipratropium-albuterol  3 mL Nebulization TID  . metoprolol tartrate  2.5 mg Intravenous Q6H  . sodium chloride flush  10-40 mL Intracatheter Q12H   Continuous Infusions: . sodium chloride 100 mL/hr at 02/24/19 0554  . sodium chloride 250 mL (02/24/19 0556)  . cefTRIAXone (ROCEPHIN)  IV 2 g (02/25/19 1414)  . fluconazole (DIFLUCAN) IV 100 mg (02/24/19 1653)  . metronidazole 500 mg (02/25/19 0559)  . valproate sodium 500 mg (02/25/19 0453)  . vancomycin 1,000 mg (02/25/19 0230)     LOS: 6 days     Jacquelin Hawkingalph Jakie Debow, MD [facility] 02/25/2019, 2:14 PM  If 7PM-7AM, please contact night-coverage [email or web address]

## 2019-02-26 LAB — CULTURE, BLOOD (ROUTINE X 2)
Culture: NO GROWTH
Special Requests: ADEQUATE

## 2019-02-26 LAB — GLUCOSE, CAPILLARY
Glucose-Capillary: 105 mg/dL — ABNORMAL HIGH (ref 70–99)
Glucose-Capillary: 113 mg/dL — ABNORMAL HIGH (ref 70–99)
Glucose-Capillary: 115 mg/dL — ABNORMAL HIGH (ref 70–99)
Glucose-Capillary: 81 mg/dL (ref 70–99)
Glucose-Capillary: 86 mg/dL (ref 70–99)
Glucose-Capillary: 90 mg/dL (ref 70–99)

## 2019-02-26 NOTE — Progress Notes (Signed)
PROGRESS NOTE    Tyrone Cunningham  NUU:[id number] DOB: September 04, 1955 DOA: 02/19/2019 PCP: System, Pcp Not In   Brief Narrative: Tyrone Cunningham is a 63 y.o. malewith past medical history significant ofchronic CHF, diabetes, chronic pain syndrome,chronic trach. Patient presented secondary to altered mental status and found to have an acute stroke in addition to pneumonia.   Assessment & Plan:   Active Problems:   Hypertension   Tracheostomy dependent (HCC)   DM (diabetes mellitus), type 2 with neurological complications (HCC)   Palliative care encounter   Stroke (cerebrum) (HCC)   Chronic diastolic CHF (congestive heart failure) (HCC)   Hemiparesis of right dominant side (HCC)   Acute stroke Associated right sided hemiplegia and facial droop. 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 Resultant cortical blindness. -Neurology recommendations: DAPT for 3 months followed by aspirin alone. Signed off -PT/OT recommending SNF  Dysphagia In setting of stroke. Failed swallow study. Pulled out NG tube and does not want it reinserted. Discussed PEG tube for which he shrugged his shoulders. Will need to make a decision soon for nutrition. May consider reinserting NG tube an addition time to attempt nutrition again in the interim. -Palliative care consulted: continued goals of care discussions planned for today -IV fluids  Confusion Patient seems slightly confused this morning. More agitated. Requiring increased oxygen overnight. Appears improved and closer to baseline.  Sepsis Secondary to pneumonia. Not present on admission. Physiology resolved.  Right lower lobe pneumonia Patient empirically started on Vancomycin, Ceftriaxone and Flagyl.  Positive blood culture 1/4 bottles positive with GPC now found to be coagulase negative staph species. Likely contamination. Vancomycin discontinued.  Essential hypertension  Patient is on lisinopril and metoprolol as an outpatient. Held on admission for permissive hypertension. -Continue home metoprolol  Chronic diastolic heart failure Stable. -Continue Digoxin  Diabetes mellitus, type 2 Insulin dependent. Hemoglobin A1C of 8.3%. -Continue Lantus and SSI  OSA Chronic respiratory failure with hypercapnia Patient is s/p tracheostomy  Lung nodule Incidental finding. 4mm in size. Non-contrast CT in 12 months if high risk. Will need outpatient follow-up.   DVT prophylaxis: Lovenox Code Status:   Code Status: Full Code Family Communication: None Disposition Plan: Discharge pending goals of care discussions   Consultants:   Neurology  Palliative care medicine  Procedures:   12/20: Transthoracic Echocardiogram IMPRESSIONS    1. Left ventricular ejection fraction, by visual estimation, is 55 to 60%. The left ventricle has normal function. There is no left ventricular hypertrophy.  2. Definity contrast agent was given IV to delineate the left ventricular endocardial borders.  3. Left ventricular diastolic function could not be evaluated.  4. The left ventricle has no regional wall motion abnormalities.  5. Global right ventricle was not well visualized.The right ventricular size is not well visualized. Right vetricular wall thickness was not assessed.  6. Left atrial size was not well visualized.  7. Right atrial size was not well visualized.  8. The mitral valve was not well visualized. No evidence of mitral valve regurgitation.  9. The tricuspid valve is not well visualized. Tricuspid valve regurgitation is not demonstrated. 10. The aortic valve was not well visualized. Aortic valve regurgitation is not visualized. 11. The pulmonic valve was not well visualized. Pulmonic valve regurgitation is not visualized. 12. The aortic root was not well visualized. 13. Very technically difficult study, even with use of echo contrast. Contrast shows normal  EF without significant wall motion abnormalities. Limited  ability to assess any other features due to body habitus and limited echo windows. 14. The interatrial septum was not well visualized.  Antimicrobials:  Vancomycin  Cefepime  Flagyl    Subjective: No issues overnight. Feces noted smeared around.  Objective: Vitals:   02/26/19 0805 02/26/19 0825 02/26/19 1119 02/26/19 1201  BP: (!) 171/88  (!) 162/88   Pulse: 97 96 (!) 109 95  Resp: 20 16 (!) 22 18  Temp: 98.2 F (36.8 C)  97.8 F (36.6 C)   TempSrc: Axillary  Axillary   SpO2: 95% 96% 96% 95%  Weight:      Height:        Intake/Output Summary (Last 24 hours) at 02/26/2019 1302 Last data filed at 02/25/2019 1629 Gross per 24 hour  Intake 2254.22 ml  Output 300 ml  Net 1954.22 ml   Filed Weights   02/19/19 1519 02/26/19 0500  Weight: 122.5 kg 118.8 kg    Examination:  General exam: Appears calm and comfortable Respiratory system: Clear to auscultation. Respiratory effort normal. Cardiovascular system: S1 & S2 heard, RRR. No murmurs, rubs, gallops or clicks. Gastrointestinal system: Abdomen is nondistended, soft and nontender. No organomegaly or masses felt. Normal bowel sounds heard. Central nervous system: Alert. Does not follow commands but does respond to my voice attentively and answer questions with shrugged shoulders Extremities: No edema. No calf tenderness Skin: No cyanosis. No rashes Psychiatry: Judgement and insight appear impaired.    Data Reviewed: I have personally reviewed following labs and imaging studies  CBC: Recent Labs  Lab 02/19/19 1522 02/19/19 1527 02/20/19 0952 02/21/19 0407 02/22/19 0220 02/23/19 0247  WBC 15.7*  --  14.0* 19.7* 20.8* 15.1*  NEUTROABS 12.2*  --   --   --   --   --   HGB 14.6 15.0 13.4 13.3 13.0 12.7*  HCT 44.8 44.0 40.0 39.5 40.1 39.4  MCV 92.0  --  90.7 89.4 93.5 93.4  PLT 227  --  224 239 206 539   Basic Metabolic Panel: Recent Labs  Lab  02/19/19 1522 02/19/19 1527 02/20/19 0952 02/21/19 0407 02/23/19 0247  NA 137 136 139 142 143  K 5.1 4.9 4.0 4.1 3.4*  CL 99 102 104 105 106  CO2 27  --  26 25 26   GLUCOSE 205* 198* 131* 128* 115*  BUN 33* 35* 29* 22 22  CREATININE 1.38* 1.10 1.07 0.96 1.03  CALCIUM 9.3  --  8.9 8.8* 8.2*   GFR: Estimated Creatinine Clearance: 88.2 mL/min (by C-G formula based on SCr of 1.03 mg/dL). Liver Function Tests: Recent Labs  Lab 02/19/19 1522  AST 14*  ALT 17  ALKPHOS 82  BILITOT 0.3  PROT 7.0  ALBUMIN 3.0*   No results for input(s): LIPASE, AMYLASE in the last 168 hours. No results for input(s): AMMONIA in the last 168 hours. Coagulation Profile: Recent Labs  Lab 02/19/19 1522  INR 1.0   Cardiac Enzymes: No results for input(s): CKTOTAL, CKMB, CKMBINDEX, TROPONINI in the last 168 hours. BNP (last 3 results) No results for input(s): PROBNP in the last 8760 hours. HbA1C: No results for input(s): HGBA1C in the last 72 hours. CBG: Recent Labs  Lab 02/25/19 1950 02/25/19 2338 02/26/19 0335 02/26/19 0802 02/26/19 1127  GLUCAP 86 92 81 90 86   Lipid Profile: No results for input(s): CHOL, HDL, LDLCALC, TRIG, CHOLHDL, LDLDIRECT in the last 72 hours. Thyroid Function Tests: No results for input(s): TSH, T4TOTAL, FREET4, T3FREE, THYROIDAB in the last  72 hours. Anemia Panel: No results for input(s): VITAMINB12, FOLATE, FERRITIN, TIBC, IRON, RETICCTPCT in the last 72 hours. Sepsis Labs: No results for input(s): PROCALCITON, LATICACIDVEN in the last 168 hours.  Recent Results (from the past 240 hour(s))  SARS CORONAVIRUS 2 (TAT 6-24 HRS) Nasopharyngeal Nasopharyngeal Swab     Status: None   Collection Time: 02/19/19  3:44 PM   Specimen: Nasopharyngeal Swab  Result Value Ref Range Status   SARS Coronavirus 2 NEGATIVE NEGATIVE Final    Comment: (NOTE) SARS-CoV-2 target nucleic acids are NOT DETECTED. The SARS-CoV-2 RNA is generally detectable in upper and lower  respiratory specimens during the acute phase of infection. Negative results do not preclude SARS-CoV-2 infection, do not rule out co-infections with other pathogens, and should not be used as the sole basis for treatment or other patient management decisions. Negative results must be combined with clinical observations, patient history, and epidemiological information. The expected result is Negative. Fact Sheet for Patients: HairSlick.no Fact Sheet for Healthcare Providers: quierodirigir.com This test is not yet approved or cleared by the [place] FDA and  has been authorized for detection and/or diagnosis of SARS-CoV-2 by FDA under an Emergency Use Authorization (EUA). This EUA will remain  in effect (meaning this test can be used) for the duration of the COVID-19 declaration under Section 56 4(b)(1) of the Act, 21 U.S.C. section 360bbb-3(b)(1), unless the authorization is terminated or revoked sooner. Performed at [facility], [address] [place], [place] [address]   MRSA culture     Status: None   Collection Time: 02/20/19  1:50 AM  Result Value Ref Range Status   Specimen Description NASAL SWAB  Final   Special Requests NONE  Final   Culture   Final    MRSA DETECTED Performed at [facility], [address], [place], [place] [address]    Report Status 02/22/2019 FINAL  Final  Culture, blood (routine x 2)     Status: None   Collection Time: 02/21/19 10:30 AM   Specimen: BLOOD RIGHT HAND  Result Value Ref Range Status   Specimen Description BLOOD RIGHT HAND  Final   Special Requests   Final    BOTTLES DRAWN AEROBIC ONLY Blood Culture adequate volume   Culture   Final    NO GROWTH 5 DAYS Performed at [facility], [address] [place], [place] [address]    Report Status 02/26/2019 FINAL  Final  Culture, blood (routine x 2)     Status: Abnormal   Collection Time: 02/21/19 10:38 AM    Specimen: BLOOD  Result Value Ref Range Status   Specimen Description BLOOD RIGHT ANTECUBITAL  Final   Special Requests   Final    BOTTLES DRAWN AEROBIC ONLY Blood Culture adequate volume   Culture  Setup Time   Final    GRAM POSITIVE COCCI IN CLUSTERS AEROBIC BOTTLE ONLY CRITICAL RESULT CALLED TO, READ BACK BY AND VERIFIED WITH: TRubin Payor PHARMD, AT [id number] 02/22/19 BY D. VANHOOK    Culture (A)  Final    STAPHYLOCOCCUS SPECIES (COAGULASE NEGATIVE) THE SIGNIFICANCE OF ISOLATING THIS ORGANISM FROM A SINGLE SET OF BLOOD CULTURES WHEN MULTIPLE SETS ARE DRAWN IS UNCERTAIN. PLEASE NOTIFY THE MICROBIOLOGY DEPARTMENT WITHIN ONE WEEK IF SPECIATION AND SENSITIVITIES ARE REQUIRED. Performed at [facility], [address], [place], [place] [address]    Report Status 02/25/2019 FINAL  Final         Radiology Studies: No  results found.      Scheduled Meds: .  stroke: mapping our early stages of recovery book   Does not apply Once  . aspirin  300 mg Rectal Daily  . budesonide  0.5 mg Nebulization BID  . digoxin  0.25 mg Intravenous Daily  . enoxaparin (LOVENOX) injection  60 mg Subcutaneous Q24H  . feeding supplement (OSMOLITE 1.5 CAL)  1,000 mL Per Tube Q24H  . feeding supplement (PRO-STAT SUGAR FREE 64)  30 mL Per Tube BID  . glycopyrrolate  0.3 mg Intravenous BID  . insulin aspart  0-5 Units Subcutaneous QHS  . insulin aspart  0-9 Units Subcutaneous TID WC  . insulin glargine  10 Units Subcutaneous Daily  . ipratropium-albuterol  3 mL Nebulization TID  . metoprolol tartrate  2.5 mg Intravenous Q6H  . sodium chloride flush  10-40 mL Intracatheter Q12H   Continuous Infusions: . sodium chloride 100 mL/hr at 02/24/19 0554  . sodium chloride 250 mL (02/24/19 0556)  . cefTRIAXone (ROCEPHIN)  IV 2 g (02/26/19 1259)  . fluconazole (DIFLUCAN) IV 100 mg (02/25/19 1755)  . metronidazole 500 mg (02/26/19 1200)  . valproate sodium 500 mg (02/26/19 0510)     LOS: 7 days      Jacquelin Hawkingalph Quinterrius Errington, MD [facility] 02/26/2019, 1:02 PM  If 7PM-7AM, please contact night-coverage [email or web address]

## 2019-02-26 NOTE — Progress Notes (Signed)
Daily Progress Note   Patient Name: Tyrone Cunningham       Date: 02/26/2019 DOB: 08/13/1955  Age: 63 y.o. MRN#: [id number] Attending Physician: Narda BondsNettey, Ralph A, MD Primary Care Physician: System, Pcp Not In Admit Date: 02/19/2019  Reason for Consultation/Follow-up: Establishing goals of care  Subjective: Patient speaking with PMV but very difficult to understand.  Called his brother [name] on face time and [name] spoke with [name].  [name] talked with him about aspiration pneumonia and attempted to assess his understanding.  [name] asked [name] about a feeding tube.  They attempted to speak for about 15 min.  Afterward [name] and I both felt that we understood maybe 15% of what [name] was attempting to say.  Neither of us could determine if [name] has the capacity to understand his predicament.  I spoke with [name] further back in my office.  I explained that no matter what [name] is going to aspirate.  [name] can choose between allowing [name] to eat for pleasure or not, and he can choose to have a PEG placed or not. [name] understood that with no nutrition Braylee's life would be measured in terms of weeks and [facility] would be able to [facility] to [facility].  With a feeding tube [name] could still aspirate but may have months, but the months would most likely be very difficult (bed bound, NPO, recurrent aspiration from saliva and tube feeds).    [name] summed it up - either way it sounds difficult.  He decided to allow [name] to have a trial of comfort feeds while he attempts again to reach his brothers.  He feels his brothers are likely out of town for the holiday.  We plan to talk again 12/27   Assessment: Patient more alert and animated today.  Able to look right.  Speaking a lot more.  Much of speech is unintelligible.  Barium  swallow study clearly shows aspiration.   Patient Profile/HPI:   63 y.o. male  with past medical history of  COPD, chronic pain, OSA with chronic tracheostomy, HFpEF, DM, and hearing loss who was admitted on 02/19/2019 from SNF with altered mental status and right sided weakness.  He was found to have a stroke and pneumonia.  He has failed his swallow study and refuses a PEG tube.    Length of Stay:  7  Current Medications: Scheduled Meds:  .  stroke: mapping our early stages of recovery book   Does not apply Once  . aspirin  300 mg Rectal Daily  . budesonide  0.5 mg Nebulization BID  . digoxin  0.25 mg Intravenous Daily  . enoxaparin (LOVENOX) injection  60 mg Subcutaneous Q24H  . feeding supplement (OSMOLITE 1.5 CAL)  1,000 mL Per Tube Q24H  . feeding supplement (PRO-STAT SUGAR FREE 64)  30 mL Per Tube BID  . glycopyrrolate  0.3 mg Intravenous BID  . insulin aspart  0-5 Units Subcutaneous QHS  . insulin aspart  0-9 Units Subcutaneous TID WC  . insulin glargine  10 Units Subcutaneous Daily  . ipratropium-albuterol  3 mL Nebulization TID  . metoprolol tartrate  2.5 mg Intravenous Q6H  . sodium chloride flush  10-40 mL Intracatheter Q12H    Continuous Infusions: . sodium chloride 100 mL/hr at 02/24/19 0554  . sodium chloride 250 mL (02/24/19 0556)  . cefTRIAXone (ROCEPHIN)  IV 2 g (02/26/19 1259)  . fluconazole (DIFLUCAN) IV 100 mg (02/25/19 1755)  . metronidazole 500 mg (02/26/19 1200)  . valproate sodium 500 mg (02/26/19 0510)    PRN Meds: sodium chloride, acetaminophen **OR** acetaminophen (TYLENOL) oral liquid 160 mg/5 mL **OR** acetaminophen, albuterol, haloperidol lactate, LORazepam, sodium chloride flush  Physical Exam       Well developed obese male lying in bed.  Attempts to speak quite a bit with PMV in place.  Mostly unintelligible. CV rrr resp no distress on Trach. Abdomen soft,obese, nt, nd LE trace edema and changes of venous stasis  Vital Signs: BP (!)  162/88 (BP Location: Right Arm)   Pulse 95   Temp 97.8 F (36.6 C) (Axillary)   Resp 18   Ht 5\' 7"  (1.702 m)   Wt 118.8 kg   SpO2 95%   BMI 41.02 kg/m  SpO2: SpO2: 95 % O2 Device: O2 Device: Tracheostomy Collar O2 Flow Rate: O2 Flow Rate (L/min): 5 L/min  Intake/output summary:   Intake/Output Summary (Last 24 hours) at 02/26/2019 1441 Last data filed at 02/25/2019 1629 Gross per 24 hour  Intake 2254.22 ml  Output 300 ml  Net 1954.22 ml   LBM: Last BM Date: 02/25/19 Baseline Weight: Weight: 122.5 kg Most recent weight: Weight: 118.8 kg       Palliative Assessment/Data: 10%      Patient Active Problem List   Diagnosis Date Noted  . CHF (congestive heart failure) (HCC)   . Hemiparesis of right dominant side (HCC)   . Chronic diastolic CHF (congestive heart failure) (HCC) 02/23/2019  . 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 HCAP (healthcare-associated pneumonia) 10/09/2017  . Hypokalemia 10/09/2017  . Abnormal liver function 10/09/2017  . Severe protein-calorie malnutrition (HCC) 10/09/2017  . Cellulitis and abscess of left leg 08/04/2017  . Diabetic foot ulcer (HCC) 08/04/2017  . Acute renal failure (ARF) (HCC) 08/04/2017  . SOB (shortness of breath) 09/13/2016  . Peripheral vascular disease (HCC) 08/19/2016  . Tracheostomy care (HCC) 06/28/2015  . Obstructive sleep apnea 06/28/2015  . Chronic pain syndrome 04/18/2015  . Constipation 01/07/2014  . Constipation, chronic 01/04/2014  . Chronic respiratory failure (HCC) 01/06/2013  . Palliative care encounter 03/16/2012  . Edema 03/16/2012  . Obesity 10/20/2011  . 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 [name] with frequent exacerbations   . Hypertension   . Hypercholesteremia   . Hearing  loss   . Diabetic neuropathy [facility])     Palliative Care Plan    Recommendations/Plan:  Patient somewhat improved today.  More  alert, active, verbal.  Brother Richardson Landry accepts risk of aspiration and authorizes comfort feed trial with aspiration precautions  Richardson Landry will speak with other brothers to choose between (1) PEG and SNF or (2) [facility].  Goals of Care and Additional Recommendations:  Limitations on Scope of Treatment: Full Scope Treatment  Code Status:  Full code   Prognosis:  With comfort feeds patient likely has less than two weeks once IVF are discontinued.  With PEG and NPO patient likely has some number of months.  Discharge Planning:  To Be Determined  Care plan was discussed with patient, RN, brother  Thank you for allowing the Palliative Medicine Team to assist in the care of this patient.  Total time spent:  60 min.     Greater than 50%  of this time was spent counseling and coordinating care related to the above assessment and plan.  Florentina Jenny, PA-C Palliative Medicine  Please contact Palliative MedicineTeam phone at [phone] for questions and concerns between 7 am - 7 pm.   Please see AMION for individual provider pager numbers.

## 2019-02-27 LAB — GLUCOSE, CAPILLARY
Glucose-Capillary: 115 mg/dL — ABNORMAL HIGH (ref 70–99)
Glucose-Capillary: 95 mg/dL (ref 70–99)
Glucose-Capillary: 99 mg/dL (ref 70–99)
Glucose-Capillary: 111 mg/dL — ABNORMAL HIGH (ref 70–99)
Glucose-Capillary: 85 mg/dL (ref 70–99)

## 2019-02-27 NOTE — Progress Notes (Signed)
PROGRESS NOTE    Tyrone Cunningham  ZOX:[id number] DOB: 04/18/1955 DOA: 02/19/2019 PCP: System, Pcp Not In   Brief Narrative: Tyrone Cunningham is a 63 y.o. malewith past medical history significant ofchronic CHF, diabetes, chronic pain syndrome,chronic trach. Patient presented secondary to altered mental status and found to have an acute stroke in addition to pneumonia.   Assessment & Plan:   Active Problems:   Hypertension   Tracheostomy dependent (HCC)   DM (diabetes mellitus), type 2 with neurological complications (HCC)   Palliative care encounter   Stroke (cerebrum) (HCC)   Chronic diastolic CHF (congestive heart failure) (HCC)   Hemiparesis of right dominant side (HCC)   Acute stroke Associated right sided hemiplegia and facial droop. 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 Resultant cortical blindness. -Neurology recommendations: DAPT for 3 months followed by aspirin alone. Signed off -PT/OT recommending SNF  Dysphagia In setting of stroke. Failed swallow study. Pulled out NG tube and does not want it reinserted. Discussed PEG tube for which he shrugged his shoulders. Will need to make a decision soon for nutrition. May consider reinserting NG tube an addition time to attempt nutrition again in the interim. -Palliative care consulted: Full code, patient would want TF -IV fluids -SLP reevaluation if possible  Confusion Patient seems slightly confused this morning. More agitated. Requiring increased oxygen overnight. Appears improved and closer to baseline.  Sepsis Secondary to pneumonia. Not present on admission. Physiology resolved.  Right lower lobe pneumonia Patient empirically started on Vancomycin, Ceftriaxone and Flagyl.  Positive blood culture 1/4 bottles positive with GPC now found to be coagulase negative staph species. Likely contamination. Vancomycin discontinued.  Essential  hypertension Patient is on lisinopril and metoprolol as an outpatient. Held on admission for permissive hypertension. -Continue home metoprolol  Chronic diastolic heart failure Stable. -Continue Digoxin  Diabetes mellitus, type 2 Insulin dependent. Hemoglobin A1C of 8.3%. -Continue Lantus and SSI  OSA Chronic respiratory failure with hypercapnia Patient is s/p tracheostomy  Lung nodule Incidental finding. 4mm in size. Non-contrast CT in 12 months if high risk. Will need outpatient follow-up.   DVT prophylaxis: Lovenox Code Status:   Code Status: Full Code Family Communication: None Disposition Plan: Discharge pending goals of care discussions   Consultants:   Neurology  Palliative care medicine  Procedures:   12/20: Transthoracic Echocardiogram IMPRESSIONS    1. Left ventricular ejection fraction, by visual estimation, is 55 to 60%. The left ventricle has normal function. There is no left ventricular hypertrophy.  2. Definity contrast agent was given IV to delineate the left ventricular endocardial borders.  3. Left ventricular diastolic function could not be evaluated.  4. The left ventricle has no regional wall motion abnormalities.  5. Global right ventricle was not well visualized.The right ventricular size is not well visualized. Right vetricular wall thickness was not assessed.  6. Left atrial size was not well visualized.  7. Right atrial size was not well visualized.  8. The mitral valve was not well visualized. No evidence of mitral valve regurgitation.  9. The tricuspid valve is not well visualized. Tricuspid valve regurgitation is not demonstrated. 10. The aortic valve was not well visualized. Aortic valve regurgitation is not visualized. 11. The pulmonic valve was not well visualized. Pulmonic valve regurgitation is not visualized. 12. The aortic root was not well visualized. 13. Very technically difficult study, even with use of echo contrast. Contrast  shows normal EF without significant wall motion  abnormalities. Limited ability to assess any other features due to body habitus and limited echo windows. 14. The interatrial septum was not well visualized.  Antimicrobials:  Vancomycin  Cefepime  Flagyl    Subjective: No issues overnight.  Objective: Vitals:   02/27/19 0729 02/27/19 0749 02/27/19 0758 02/27/19 1115  BP: (!) 181/94   (!) 167/89  Pulse: 96 95 96 98  Resp: (!) Temp: 98.9 F (37.2 C)   98.3 F (36.8 C)  TempSrc: Oral   Oral  SpO2: 92%  100% 96%  Weight:      Height:        Intake/Output Summary (Last 24 hours) at 02/27/2019 1254 Last data filed at 02/27/2019 4166 Gross per 24 hour  Intake 1250 ml  Output 600 ml  Net 650 ml   Filed Weights   02/19/19 1519 02/26/19 0500  Weight: 122.5 kg 118.8 kg    Examination:  General exam: Appears calm and comfortable Respiratory system: Clear to auscultation. Respiratory effort normal. Cardiovascular system: S1 & S2 heard, RRR. No murmurs, rubs, gallops or clicks. Gastrointestinal system: Abdomen is nondistended, soft and nontender. No organomegaly or masses felt. Normal bowel sounds heard. Central nervous system: Alert. Extremities: No edema. No calf tenderness Skin: No cyanosis. No rashes    Data Reviewed: I have personally reviewed following labs and imaging studies  CBC: Recent Labs  Lab 02/21/19 0407 02/22/19 0220 02/23/19 0247  WBC 19.7* 20.8* 15.1*  HGB 13.3 13.0 12.7*  HCT 39.5 40.1 39.4  MCV 89.4 93.5 93.4  PLT 239 206 188   Basic Metabolic Panel: Recent Labs  Lab 02/21/19 0407 02/23/19 0247  NA 142 143  K 4.1 3.4*  CL 105 106  CO2 25 26  GLUCOSE 128* 115*  BUN 22 22  CREATININE 0.96 1.03  CALCIUM 8.8* 8.2*   GFR: Estimated Creatinine Clearance: 88.2 mL/min (by C-G formula based on SCr of 1.03 mg/dL). Liver Function Tests: No results for input(s): AST, ALT, ALKPHOS, BILITOT, PROT, ALBUMIN in the last 168  hours. No results for input(s): LIPASE, AMYLASE in the last 168 hours. No results for input(s): AMMONIA in the last 168 hours. Coagulation Profile: No results for input(s): INR, PROTIME in the last 168 hours. Cardiac Enzymes: No results for input(s): CKTOTAL, CKMB, CKMBINDEX, TROPONINI in the last 168 hours. BNP (last 3 results) No results for input(s): PROBNP in the last 8760 hours. HbA1C: No results for input(s): HGBA1C in the last 72 hours. CBG: Recent Labs  Lab 02/26/19 1926 02/26/19 2344 02/27/19 0344 02/27/19 0733 02/27/19 1113  GLUCAP 115* 113* 115* 95 99   Lipid Profile: No results for input(s): CHOL, HDL, LDLCALC, TRIG, CHOLHDL, LDLDIRECT in the last 72 hours. Thyroid Function Tests: No results for input(s): TSH, T4TOTAL, FREET4, T3FREE, THYROIDAB in the last 72 hours. Anemia Panel: No results for input(s): VITAMINB12, FOLATE, FERRITIN, TIBC, IRON, RETICCTPCT in the last 72 hours. Sepsis Labs: No results for input(s): PROCALCITON, LATICACIDVEN in the last 168 hours.  Recent Results (from the past 240 hour(s))  SARS CORONAVIRUS 2 (TAT 6-24 HRS) Nasopharyngeal Nasopharyngeal Swab     Status: None   Collection Time: 02/19/19  3:44 PM   Specimen: Nasopharyngeal Swab  Result Value Ref Range Status   SARS Coronavirus 2 NEGATIVE NEGATIVE Final    Comment: (NOTE) SARS-CoV-2 target nucleic acids are NOT DETECTED. The SARS-CoV-2 RNA is generally detectable in upper and lower respiratory specimens during the acute phase of infection. Negative results do  not preclude SARS-CoV-2 infection, do not rule out co-infections with other pathogens, and should not be used as the sole basis for treatment or other patient management decisions. Negative results must be combined with clinical observations, patient history, and epidemiological information. The expected result is Negative. Fact Sheet for Patients: HairSlick.no Fact Sheet for Healthcare  Providers: quierodirigir.com This test is not yet approved or cleared by the [place] FDA and  has been authorized for detection and/or diagnosis of SARS-CoV-2 by FDA under an Emergency Use Authorization (EUA). This EUA will remain  in effect (meaning this test can be used) for the duration of the COVID-19 declaration under Section 56 4(b)(1) of the Act, 21 U.S.C. section 360bbb-3(b)(1), unless the authorization is terminated or revoked sooner. Performed at [facility], [address], [place], [place] [address]   MRSA culture     Status: None   Collection Time: 02/20/19  1:50 AM  Result Value Ref Range Status   Specimen Description NASAL SWAB  Final   Special Requests NONE  Final   Culture   Final    MRSA DETECTED Performed at [facility], [address], [place], [place] [address]    Report Status 02/22/2019 FINAL  Final  Culture, blood (routine x 2)     Status: None   Collection Time: 02/21/19 10:30 AM   Specimen: BLOOD RIGHT HAND  Result Value Ref Range Status   Specimen Description BLOOD RIGHT HAND  Final   Special Requests   Final    BOTTLES DRAWN AEROBIC ONLY Blood Culture adequate volume   Culture   Final    NO GROWTH 5 DAYS Performed at [facility], [address], [place], [place] [address]    Report Status 02/26/2019 FINAL  Final  Culture, blood (routine x 2)     Status: Abnormal   Collection Time: 02/21/19 10:38 AM   Specimen: BLOOD  Result Value Ref Range Status   Specimen Description BLOOD RIGHT ANTECUBITAL  Final   Special Requests   Final    BOTTLES DRAWN AEROBIC ONLY Blood Culture adequate volume   Culture  Setup Time   Final    GRAM POSITIVE COCCI IN CLUSTERS AEROBIC BOTTLE ONLY CRITICAL RESULT CALLED TO, READ BACK BY AND VERIFIED WITH: TRubin Payor PHARMD, AT 0623 02/22/19 BY D. VANHOOK    Culture (A)  Final    STAPHYLOCOCCUS SPECIES (COAGULASE NEGATIVE) THE SIGNIFICANCE OF ISOLATING THIS ORGANISM  FROM A SINGLE SET OF BLOOD CULTURES WHEN MULTIPLE SETS ARE DRAWN IS UNCERTAIN. PLEASE NOTIFY THE MICROBIOLOGY DEPARTMENT WITHIN ONE WEEK IF SPECIATION AND SENSITIVITIES ARE REQUIRED. Performed at [facility], [address] [place], [place] [address]    Report Status 02/25/2019 FINAL  Final         Radiology Studies: No results found.      Scheduled Meds: .  stroke: mapping our early stages of recovery book   Does not apply Once  . aspirin  300 mg Rectal Daily  . budesonide  0.5 mg Nebulization BID  . digoxin  0.25 mg Intravenous Daily  . enoxaparin (LOVENOX) injection  60 mg Subcutaneous Q24H  . feeding supplement (OSMOLITE 1.5 CAL)  1,000 mL Per Tube Q24H  . feeding supplement (PRO-STAT SUGAR FREE 64)  30 mL Per Tube BID  . glycopyrrolate  0.3 mg Intravenous BID  . insulin aspart  0-5 Units Subcutaneous QHS  . insulin aspart  0-9 Units Subcutaneous TID WC  . insulin  glargine  10 Units Subcutaneous Daily  . ipratropium-albuterol  3 mL Nebulization TID  . metoprolol tartrate  2.5 mg Intravenous Q6H  . sodium chloride flush  10-40 mL Intracatheter Q12H   Continuous Infusions: . sodium chloride 100 mL/hr at 02/24/19 0554  . sodium chloride 250 mL (02/24/19 0556)  . cefTRIAXone (ROCEPHIN)  IV 2 g (02/26/19 1259)  . fluconazole (DIFLUCAN) IV 100 mg (02/26/19 1808)  . metronidazole 500 mg (02/27/19 0510)  . valproate sodium 500 mg (02/27/19 0616)     LOS: 8 days     Cordelia Poche, MD [facility] 02/27/2019, 12:54 PM  If 7PM-7AM, please contact night-coverage [email or web address]

## 2019-02-27 NOTE — Progress Notes (Signed)
Paged Speech therapy for reevaluation of pt's swallowing. Speech stated that they will try their best to reevaluate today, but may not have time until tomorrow.

## 2019-02-27 NOTE — Progress Notes (Signed)
Palliative follow up.    Discussed with Dr. Lonny Prude     Patient more alert today.  SLP evaluation re-ordered by Dr. Lonny Prude.   I called his brother Tyrone Cunningham on Face Time so they could talk.  Tyrone Cunningham did not know he was in the hospital and was surprised to learn he had had a stroke.  We raised the question of a feeding tube.  [name] responded "Of course I would want a feeding tube!  I don't want to die".  Tyrone Cunningham and Tyrone Cunningham talked a little more.  We hung up and I called Tyrone Cunningham back from the office.  Tyrone Cunningham was pleased with Tyrone Cunningham's improvement. Tyrone Cunningham was in agreement that Tyrone Cunningham is incapable of making decisions regarding his health care.     We discussed DNR.  Tyrone Cunningham deferred the subject until after he can talk with his brothers about it.  He did say that if Tyrone Cunningham was put on a ventilator and was not making improvements he would not leave him on the vent long term.  Tyrone Cunningham started to discuss Tyrone Cunningham's desire to move to another nursing facility.  We talked about Skilled care vs long term care and differences between the two.  Tyrone Cunningham will likely discharge to skilled care after his stroke to try to regain as much function as possible.   Recommend Tyrone Cunningham be followed by Palliative care at [facility] on discharge.  Hopeful we can avoid the need for a feeding tube this admission  PMT will continue to follow with you.  Tyrone Jenny, PA-C Palliative Medicine Office:  [phone]  35 min.

## 2019-02-27 NOTE — Progress Notes (Signed)
  Speech Language Pathology Patient Details Name: Tyrone Cunningham MRN: [id number] DOB: 03-27-1955 Today's Date: 02/27/2019 Time:  -       Speech Pathologist colleague received call from RN that pt pulled out NGT and could ST reassess. This therapist reviewed chart. Found to have a stroke in pons region (significantly contributes to swallow function). Had MBS 12/22 revealing severe dysphagia and npo recommended. Palliative care notes reviewed- initially uncertain of pt's thoughts re: PEG. Palliative care note states pt would want a PEG but he is not fully capable of making decisions. To objectively assess current swallow function he will need a repeat MBS- could plan for tomorrow if appropriate.    Houston Siren 02/27/2019, 2:49 PM   Orbie Pyo Colvin Caroli.Ed Risk analyst [phone] Office [phone]

## 2019-02-28 ENCOUNTER — Inpatient Hospital Stay (HOSPITAL_COMMUNITY): Payer: Medicare Other

## 2019-02-28 DIAGNOSIS — Z515 Encounter for palliative care: Secondary | ICD-10-CM

## 2019-02-28 DIAGNOSIS — J690 Pneumonitis due to inhalation of food and vomit: Secondary | ICD-10-CM

## 2019-02-28 LAB — GLUCOSE, CAPILLARY
Glucose-Capillary: 101 mg/dL — ABNORMAL HIGH (ref 70–99)
Glucose-Capillary: 110 mg/dL — ABNORMAL HIGH (ref 70–99)
Glucose-Capillary: 110 mg/dL — ABNORMAL HIGH (ref 70–99)
Glucose-Capillary: 137 mg/dL — ABNORMAL HIGH (ref 70–99)
Glucose-Capillary: 141 mg/dL — ABNORMAL HIGH (ref 70–99)
Glucose-Capillary: 142 mg/dL — ABNORMAL HIGH (ref 70–99)
Glucose-Capillary: 98 mg/dL (ref 70–99)

## 2019-02-28 LAB — CBC
HCT: 42.7 % (ref 39.0–52.0)
Hemoglobin: 14 g/dL (ref 13.0–17.0)
MCH: 30.6 pg (ref 26.0–34.0)
MCHC: 32.8 g/dL (ref 30.0–36.0)
MCV: 93.4 fL (ref 80.0–100.0)
Platelets: 208 10*3/uL (ref 150–400)
RBC: 4.57 MIL/uL (ref 4.22–5.81)
RDW: 13.3 % (ref 11.5–15.5)
WBC: 13.5 10*3/uL — ABNORMAL HIGH (ref 4.0–10.5)
nRBC: 0.1 % (ref 0.0–0.2)

## 2019-02-28 LAB — MAGNESIUM: Magnesium: 2.3 mg/dL (ref 1.7–2.4)

## 2019-02-28 LAB — BASIC METABOLIC PANEL
Anion gap: 9 (ref 5–15)
BUN: 17 mg/dL (ref 8–23)
CO2: 27 mmol/L (ref 22–32)
Calcium: 8.2 mg/dL — ABNORMAL LOW (ref 8.9–10.3)
Chloride: 110 mmol/L (ref 98–111)
Creatinine, Ser: 0.96 mg/dL (ref 0.61–1.24)
GFR calc Af Amer: >60 mL/min (ref >60)
GFR calc non Af Amer: >60 mL/min (ref >60)
Glucose, Bld: 114 mg/dL — ABNORMAL HIGH (ref 70–99)
Potassium: 3.5 mmol/L (ref 3.5–5.1)
Sodium: 146 mmol/L — ABNORMAL HIGH (ref 135–145)

## 2019-02-28 MED ORDER — DEXTROSE-NACL 5-0.45 % IV SOLN: INTRAVENOUS | Status: DC

## 2019-02-28 MED ORDER — CLOPIDOGREL BISULFATE 75 MG PO TABS
75.0000 mg | ORAL_TABLET | Freq: Every day | ORAL | Status: DC
  Administered 2019-02-28 – 2019-03-10 (×10): 75 mg via ORAL
  Filled 2019-02-28 (×11): qty 1

## 2019-02-28 MED ORDER — OXYCODONE HCL 5 MG PO TABS: 2.5000 mg | ORAL_TABLET | ORAL | Status: DC | PRN

## 2019-02-28 MED ORDER — KETOROLAC TROMETHAMINE 15 MG/ML IJ SOLN
15.0000 mg | Freq: Four times a day (QID) | INTRAMUSCULAR | Status: AC | PRN
  Administered 2019-02-28 – 2019-03-04 (×4): 15 mg via INTRAVENOUS
  Filled 2019-02-28 (×4): qty 1

## 2019-02-28 MED ORDER — ATORVASTATIN CALCIUM 10 MG PO TABS
20.0000 mg | ORAL_TABLET | Freq: Every day | ORAL | Status: DC
  Administered 2019-02-28 – 2019-03-10 (×10): 20 mg via ORAL
  Filled 2019-02-28 (×10): qty 2

## 2019-02-28 MED ORDER — ADULT MULTIVITAMIN W/MINERALS CH
1.0000 | ORAL_TABLET | Freq: Every day | ORAL | Status: DC
  Administered 2019-02-28 – 2019-03-08 (×9): 1 via ORAL
  Filled 2019-02-28 (×10): qty 1

## 2019-02-28 MED ORDER — ASPIRIN EC 325 MG PO TBEC
325.0000 mg | DELAYED_RELEASE_TABLET | Freq: Every day | ORAL | Status: DC
  Administered 2019-02-28 – 2019-03-08 (×9): 325 mg via ORAL
  Filled 2019-02-28 (×10): qty 1

## 2019-02-28 MED ORDER — ENSURE ENLIVE PO LIQD
237.0000 mL | Freq: Two times a day (BID) | ORAL | Status: DC
  Administered 2019-02-28 – 2019-03-10 (×11): 237 mL via ORAL

## 2019-02-28 MED ORDER — MORPHINE SULFATE (PF) 2 MG/ML IV SOLN
2.0000 mg | INTRAVENOUS | Status: DC | PRN
  Administered 2019-02-28: 2 mg via INTRAVENOUS
  Filled 2019-02-28: qty 1

## 2019-02-28 NOTE — Progress Notes (Addendum)
Daily Progress Note   Patient Name: Tyrone Cunningham       Date: 02/28/2019 DOB: 04/24/1955  Age: 63 y.o. MRN#: [id number] Attending Physician: Narda Bonds, MD Primary Care Physician: System, Pcp Not In Admit Date: 02/19/2019     ADDENDUM:  Patient did well on follow up MBSS.  Discussed with brother [name].  Also discussed decreased cognition.  [name] felt Langdon likely had some underlying dementia prior to this hospitalization that has been exacerbated by the stroke.     [name] requested that a new SNF search be initiated.  Reason for Consultation/Follow-up: Establishing goals of care and Psychosocial/spiritual support  Subjective: I hurt all over.  I feel bad.   I asked if he was at the facility or the hospital.  He replied the facility.  I asked if he had had a stoke (I told him yesterday he had had a stroke) and he replied "I hope not".  His responses to questions are calm and more coherent today.  I can understand his speech much better.  Assessment: Patient post stroke - seems to have small daily improvements in mental status and speech clarity.   Patient Profile/HPI:  63 y.o. male  with past medical history of  COPD, chronic pain, OSA with chronic tracheostomy, HFpEF, DM, and hearing loss who was admitted on 02/19/2019 from SNF with altered mental status and right sided weakness.  He was found to have a stroke and pneumonia.  Original MBSS was + for aspiration.  His speech has improved over the last several days.  Second MBSS is scheduled today.  Hopefully the question of PEG will become moot.   Length of Stay: 9  Current Medications: Scheduled Meds:  .  stroke: mapping our early stages of recovery book   Does not apply Once  . aspirin  300 mg Rectal Daily  . budesonide  0.5 mg  Nebulization BID  . digoxin  0.25 mg Intravenous Daily  . enoxaparin (LOVENOX) injection  60 mg Subcutaneous Q24H  . feeding supplement (OSMOLITE 1.5 CAL)  1,000 mL Per Tube Q24H  . feeding supplement (PRO-STAT SUGAR FREE 64)  30 mL Per Tube BID  . glycopyrrolate  0.3 mg Intravenous BID  . insulin aspart  0-5 Units Subcutaneous QHS  . insulin aspart  0-9 Units Subcutaneous TID WC  .  insulin glargine  10 Units Subcutaneous Daily  . ipratropium-albuterol  3 mL Nebulization TID  . metoprolol tartrate  2.5 mg Intravenous Q6H  . sodium chloride flush  10-40 mL Intracatheter Q12H    Continuous Infusions: . sodium chloride 100 mL/hr at 02/28/19 0735  . sodium chloride 250 mL (02/24/19 0556)  . cefTRIAXone (ROCEPHIN)  IV Stopped (02/27/19 1900)  . fluconazole (DIFLUCAN) IV Stopped (02/27/19 1924)  . metronidazole Stopped (02/28/19 0932)  . valproate sodium Stopped (02/28/19 0727)    PRN Meds: sodium chloride, acetaminophen **OR** acetaminophen (TYLENOL) oral liquid 160 mg/5 mL **OR** acetaminophen, albuterol, haloperidol lactate, LORazepam, oxyCODONE, sodium chloride flush  Physical Exam        Obese gentleman, awake, alert, calm. CV rrr resp no distress on chronic trach. Abdomen obese, soft  Vital Signs: BP (!) 170/103   Pulse 94   Temp 97.9 F (36.6 C) (Axillary)   Resp 16   Ht 5\' 7"  (1.702 m)   Wt 121.5 kg   SpO2 99%   BMI 41.95 kg/m  SpO2: SpO2: 99 % O2 Device: O2 Device: Tracheostomy Collar O2 Flow Rate: O2 Flow Rate (L/min): 5 L/min  Intake/output summary:   Intake/Output Summary (Last 24 hours) at 02/28/2019 0954 Last data filed at 02/28/2019 6712 Gross per 24 hour  Intake 4194.51 ml  Output 163 ml  Net 4031.51 ml   LBM: Last BM Date: 02/27/19 Baseline Weight: Weight: 122.5 kg Most recent weight: Weight: 121.5 kg       Palliative Assessment/Data: 30%      Patient Active Problem List   Diagnosis Date Noted  . CHF (congestive heart failure) ([place])   .  Hemiparesis of right dominant side ([place])   . Chronic diastolic CHF (congestive heart failure) ([place]) 02/23/2019  . Stroke (cerebrum) ([place]) 02/19/2019  . HCAP (healthcare-associated pneumonia) 10/09/2017  . Hypokalemia 10/09/2017  . Abnormal liver function 10/09/2017  . Severe protein-calorie malnutrition ([place]) 10/09/2017  . Cellulitis and abscess of left leg 08/04/2017  . Diabetic foot ulcer ([place]) 08/04/2017  . Acute renal failure (ARF) ([place]) 08/04/2017  . SOB (shortness of breath) 09/13/2016  . Peripheral vascular disease ([place]) 08/19/2016  . Tracheostomy care ([place]) 06/28/2015  . Obstructive sleep apnea 06/28/2015  . Chronic pain syndrome 04/18/2015  . Constipation 01/07/2014  . Constipation, chronic 01/04/2014  . Chronic respiratory failure ([place]) 01/06/2013  . Palliative care encounter 03/16/2012  . Edema 03/16/2012  . Obesity 10/20/2011  . DM (diabetes mellitus), type 2 with neurological complications ([place]) 45/80/9983  . Tracheostomy dependent ([place]) 10/16/2011  . [name] with frequent exacerbations   . Hypertension   . Hypercholesteremia   . Hearing loss   . Diabetic neuropathy [facility])     Palliative Care Plan    Recommendations/Plan:  Second MBSS will be today.  Hopefully he will be able to eat.   Family is very much on the fence about PEG.  PMT has been talking with Richardson Landry (brother) about DNR.  Richardson Landry is very kind but slow in decision making and needs to converse with his other two brothers after they return from being out of town over the holiday.  At this point would plan for DC to SNF (acute rehab) with Palliative to follow if swallow study goes well.  Another Palliative Team Member will follow Mr. Mcclafferty tomorrow.  Goals of Care and Additional Recommendations:  Limitations on Scope of Treatment: Full Scope Treatment  Code Status:  Full code  Prognosis:   Unable to determine.  Unfortunately he is at high risk to develop worsening respiratory failure, or another  acute event (stroke).  Now bedbound placing him at risk for pressure ulcerations.  Would not be surprising if his prognosis was less than 6 months.   Discharge Planning:  Skilled Nursing Facility for rehab with Palliative care service follow-up  Care plan was discussed with RN  Thank you for allowing the Palliative Medicine Team to assist in the care of this patient.  Total time spent:  25 min.     Greater than 50%  of this time was spent counseling and coordinating care related to the above assessment and plan.  Norvel RichardsMarianne Tamee Battin, PA-C Palliative Medicine  Please contact Palliative MedicineTeam phone at [phone] for questions and concerns between 7 am - 7 pm.   Please see AMION for individual provider pager numbers.

## 2019-02-28 NOTE — Progress Notes (Signed)
New condom cath applied to pt by RN and Theatre stage manager. RN and pt both educated by Mudlogger. Delia Heady RN

## 2019-02-28 NOTE — Progress Notes (Signed)
PROGRESS NOTE    Tyrone Cunningham  WJX:[id number] DOB: 03/16/1955 DOA: 02/19/2019 PCP: System, Pcp Not In   Brief Narrative: Tyrone Cunningham is a 63 y.o. malewith past medical history significant ofchronic CHF, diabetes, chronic pain syndrome,chronic trach. Patient presented secondary to altered mental status and found to have an acute stroke in addition to pneumonia.   Assessment & Plan:   Active Problems:   Hypertension   Tracheostomy dependent ([place])   DM (diabetes mellitus), type 2 with neurological complications ([place])   Palliative care encounter   Stroke (cerebrum) (HCC)   Chronic diastolic CHF (congestive heart failure) (HCC)   Hemiparesis of right dominant side (HCC)   Aspiration pneumonia of left lung (HCC)   Acute stroke Associated right sided hemiplegia and facial droop. 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 Resultant cortical blindness. -Neurology recommendations: DAPT for 3 months followed by aspirin alone. Signed off -PT/OT recommending SNF  Dysphagia In setting of stroke. Failed swallow study. Pulled out NG tube and does not want it reinserted. Discussed PEG tube for which he shrugged his shoulders. Will need to make a decision soon for nutrition. May consider reinserting NG tube an addition time to attempt nutrition again in the interim. -Palliative care consulted: Full code, patient would want TF -IV fluids -SLP recommendations: MBS today  Confusion Waxes and wanes.  Sepsis Secondary to pneumonia. Not present on admission. Physiology resolved.  Abdominal pain Unknown etiology. Associated distension. -Abdominal x-ray  Hypernatremia Iatrogenic in setting of normal saline -Switch to D5 1/2 NS  Right lower lobe pneumonia Patient empirically started on Vancomycin, Ceftriaxone and Flagyl. -Can discontinue Ceftriaxone/Flagyl today  Positive blood culture 1/4 bottles positive with  GPC now found to be coagulase negative staph species. Likely contamination. Vancomycin discontinued.  Essential hypertension Patient is on lisinopril and metoprolol as an outpatient. Held on admission for permissive hypertension. -Continue home metoprolol  Chronic diastolic heart failure Stable. -Continue Digoxin  Diabetes mellitus, type 2 Insulin dependent. Hemoglobin A1C of 8.3%. -Continue Lantus and SSI  OSA Chronic respiratory failure with hypercapnia Patient is s/p tracheostomy  Lung nodule Incidental finding. 27mm in size. Non-contrast CT in 12 months if high risk. Will need outpatient follow-up.   DVT prophylaxis: Lovenox Code Status:   Code Status: Full Code Family Communication: None Disposition Plan: Discharge pending goals of care discussions   Consultants:   Neurology  Palliative care medicine  Procedures:   12/20: Transthoracic Echocardiogram IMPRESSIONS    1. Left ventricular ejection fraction, by visual estimation, is 55 to 60%. The left ventricle has normal function. There is no left ventricular hypertrophy.  2. Definity contrast agent was given IV to delineate the left ventricular endocardial borders.  3. Left ventricular diastolic function could not be evaluated.  4. The left ventricle has no regional wall motion abnormalities.  5. Global right ventricle was not well visualized.The right ventricular size is not well visualized. Right vetricular wall thickness was not assessed.  6. Left atrial size was not well visualized.  7. Right atrial size was not well visualized.  8. The mitral valve was not well visualized. No evidence of mitral valve regurgitation.  9. The tricuspid valve is not well visualized. Tricuspid valve regurgitation is not demonstrated. 10. The aortic valve was not well visualized. Aortic valve regurgitation is not visualized. 11. The pulmonic valve was not well visualized. Pulmonic valve regurgitation is not visualized. 12. The  aortic root was not well visualized.  13. Very technically difficult study, even with use of echo contrast. Contrast shows normal EF without significant wall motion abnormalities. Limited ability to assess any other features due to body habitus and limited echo windows. 14. The interatrial septum was not well visualized.  Antimicrobials:  Vancomycin  Cefepime  Flagyl    Subjective: Has some abdominal pain  Objective: Vitals:   02/28/19 0329 02/28/19 0500 02/28/19 0740 02/28/19 0840  BP: (!) 147/78  (!) 170/103   Pulse: 94  96 94  Resp: Temp: 97.8 F (36.6 C)  97.9 F (36.6 C)   TempSrc: Axillary  Axillary   SpO2: 97%  95% 99%  Weight:  121.5 kg    Height:        Intake/Output Summary (Last 24 hours) at 02/28/2019 1133 Last data filed at 02/28/2019 1000 Gross per 24 hour  Intake 4434.62 ml  Output 163 ml  Net 4271.62 ml   Filed Weights   02/19/19 1519 02/26/19 0500 02/28/19 0500  Weight: 122.5 kg 118.8 kg 121.5 kg    Examination:  General exam: Appears calm and comfortable Respiratory system: Clear to auscultation. Respiratory effort normal. Cardiovascular system: S1 & S2 heard, RRR. No murmurs, rubs, gallops or clicks. Gastrointestinal system: Abdomen is distended, soft and tender. Normal bowel sounds heard. Central nervous system: Alert and not oriented to place. Extremities: No edema. No calf tenderness Skin: No cyanosis. No rashes Psychiatry: Judgement and insight appear impaired. Mood & affect appropriate.    Data Reviewed: I have personally reviewed following labs and imaging studies  CBC: Recent Labs  Lab 02/22/19 0220 02/23/19 0247  WBC 20.8* 15.1*  HGB 13.0 12.7*  HCT 40.1 39.4  MCV 93.5 93.4  PLT 206 188   Basic Metabolic Panel: Recent Labs  Lab 02/23/19 0247 02/28/19 0052  NA 143 146*  K 3.4* 3.5  CL 106 110  CO2 26 27  GLUCOSE 115* 114*  BUN 22 17  CREATININE 1.03 0.96  CALCIUM 8.2* 8.2*  MG  --  2.3    GFR: Estimated Creatinine Clearance: 95.8 mL/min (by C-G formula based on SCr of 0.96 mg/dL). Liver Function Tests: No results for input(s): AST, ALT, ALKPHOS, BILITOT, PROT, ALBUMIN in the last 168 hours. No results for input(s): LIPASE, AMYLASE in the last 168 hours. No results for input(s): AMMONIA in the last 168 hours. Coagulation Profile: No results for input(s): INR, PROTIME in the last 168 hours. Cardiac Enzymes: No results for input(s): CKTOTAL, CKMB, CKMBINDEX, TROPONINI in the last 168 hours. BNP (last 3 results) No results for input(s): PROBNP in the last 8760 hours. HbA1C: No results for input(s): HGBA1C in the last 72 hours. CBG: Recent Labs  Lab 02/27/19 1705 02/27/19 1941 02/27/19 2317 02/28/19 0354 02/28/19 0744  GLUCAP 111* 85 101* 98 110*   Lipid Profile: No results for input(s): CHOL, HDL, LDLCALC, TRIG, CHOLHDL, LDLDIRECT in the last 72 hours. Thyroid Function Tests: No results for input(s): TSH, T4TOTAL, FREET4, T3FREE, THYROIDAB in the last 72 hours. Anemia Panel: No results for input(s): VITAMINB12, FOLATE, FERRITIN, TIBC, IRON, RETICCTPCT in the last 72 hours. Sepsis Labs: No results for input(s): PROCALCITON, LATICACIDVEN in the last 168 hours.  Recent Results (from the past 240 hour(s))  SARS CORONAVIRUS 2 (TAT 6-24 HRS) Nasopharyngeal Nasopharyngeal Swab     Status: None   Collection Time: 02/19/19  3:44 PM   Specimen: Nasopharyngeal Swab  Result Value Ref Range Status   SARS Coronavirus 2 NEGATIVE NEGATIVE Final  Comment: (NOTE) SARS-CoV-2 target nucleic acids are NOT DETECTED. The SARS-CoV-2 RNA is generally detectable in upper and lower respiratory specimens during the acute phase of infection. Negative results do not preclude SARS-CoV-2 infection, do not rule out co-infections with other pathogens, and should not be used as the sole basis for treatment or other patient management decisions. Negative results must be combined with  clinical observations, patient history, and epidemiological information. The expected result is Negative. Fact Sheet for Patients: HairSlick.nohttps://www.fda.gov/media/138098/download Fact Sheet for Healthcare Providers: quierodirigir.comhttps://www.fda.gov/media/138095/download This test is not yet approved or cleared by the [place] FDA and  has been authorized for detection and/or diagnosis of SARS-CoV-2 by FDA under an Emergency Use Authorization (EUA). This EUA will remain  in effect (meaning this test can be used) for the duration of the COVID-19 declaration under Section 56 4(b)(1) of the Act, 21 U.S.C. section 360bbb-3(b)(1), unless the authorization is terminated or revoked sooner. Performed at [facility], [address] [place], [place] [address]   MRSA culture     Status: None   Collection Time: 02/20/19  1:50 AM  Result Value Ref Range Status   Specimen Description NASAL SWAB  Final   Special Requests NONE  Final   Culture   Final    MRSA DETECTED Performed at [facility], [address] [place], [place] [address]    Report Status 02/22/2019 FINAL  Final  Culture, blood (routine x 2)     Status: None   Collection Time: 02/21/19 10:30 AM   Specimen: BLOOD RIGHT HAND  Result Value Ref Range Status   Specimen Description BLOOD RIGHT HAND  Final   Special Requests   Final    BOTTLES DRAWN AEROBIC ONLY Blood Culture adequate volume   Culture   Final    NO GROWTH 5 DAYS Performed at [facility], [address] [place], [place] [address]    Report Status 02/26/2019 FINAL  Final  Culture, blood (routine x 2)     Status: Abnormal   Collection Time: 02/21/19 10:38 AM   Specimen: BLOOD  Result Value Ref Range Status   Specimen Description BLOOD RIGHT ANTECUBITAL  Final   Special Requests   Final    BOTTLES DRAWN AEROBIC ONLY Blood Culture adequate volume   Culture  Setup Time   Final    GRAM POSITIVE COCCI IN CLUSTERS AEROBIC BOTTLE ONLY CRITICAL RESULT CALLED TO,  READ BACK BY AND VERIFIED WITH: Rubin Payor. BAUMEISTER PHARMD, AT [id number] 02/22/19 BY D. VANHOOK    Culture (A)  Final    STAPHYLOCOCCUS SPECIES (COAGULASE NEGATIVE) THE SIGNIFICANCE OF ISOLATING THIS ORGANISM FROM A SINGLE SET OF BLOOD CULTURES WHEN MULTIPLE SETS ARE DRAWN IS UNCERTAIN. PLEASE NOTIFY THE MICROBIOLOGY DEPARTMENT WITHIN ONE WEEK IF SPECIATION AND SENSITIVITIES ARE REQUIRED. Performed at [facility], [address] [place], [place] [address]    Report Status 02/25/2019 FINAL  Final         Radiology Studies: No results found.      Scheduled Meds: .  stroke: mapping our early stages of recovery book   Does not apply Once  . aspirin  300 mg Rectal Daily  . budesonide  0.5 mg Nebulization BID  . digoxin  0.25 mg Intravenous Daily  . enoxaparin (LOVENOX) injection  60 mg Subcutaneous Q24H  . feeding supplement (OSMOLITE 1.5 CAL)  1,000 mL Per Tube Q24H  . feeding supplement (PRO-STAT SUGAR FREE 64)  30 mL Per Tube BID  .  glycopyrrolate  0.3 mg Intravenous BID  . insulin aspart  0-5 Units Subcutaneous QHS  . insulin aspart  0-9 Units Subcutaneous TID WC  . insulin glargine  10 Units Subcutaneous Daily  . ipratropium-albuterol  3 mL Nebulization TID  . metoprolol tartrate  2.5 mg Intravenous Q6H  . sodium chloride flush  10-40 mL Intracatheter Q12H   Continuous Infusions: . sodium chloride 100 mL/hr at 02/28/19 0735  . sodium chloride 250 mL (02/24/19 0556)  . cefTRIAXone (ROCEPHIN)  IV Stopped (02/27/19 1900)  . fluconazole (DIFLUCAN) IV Stopped (02/27/19 1924)  . metronidazole Stopped (02/28/19 5465)  . valproate sodium Stopped (02/28/19 0727)     LOS: 9 days     Jacquelin Hawking, MD [facility] 02/28/2019, 11:33 AM  If 7PM-7AM, please contact night-coverage [email or web address]

## 2019-02-28 NOTE — Progress Notes (Signed)
  Speech Language Pathology   Patient Details Name: Tyrone Cunningham MRN: [id number] DOB: June 30, 1955             MBS scheduled 1400   [name] 02/28/2019, 8:59 AM

## 2019-02-28 NOTE — TOC Progression Note (Signed)
Transition of Care [facility]) - Progression Note    Patient Details  Name: Tyrone Cunningham MRN: [id number] Date of Birth: 1955/06/13  Transition of Care [facility]) CM/SW [place], [place] Phone Number: 02/28/2019, 1:28 PM  Clinical Narrative:     [name] is following and assisting with disposition planning. Patient is to have his second swallow study today and someone from palliative will follow up with the family tomorrow.   [name] will continue to follow and assist with TOC needs.   Expected Discharge Plan: [place] Barriers to Discharge: Continued Medical Work up  Expected Discharge Plan and Services Expected Discharge Plan: [place] In-house Referral: Clinical Social Work Discharge Planning Services: CM Consult Post Acute Care Choice: Resumption of Svcs/PTA Provider, [place], Nursing Home Living arrangements for the past 2 months: [place]                                       Social Determinants of Health (SDOH) Interventions    Readmission Risk Interventions Readmission Risk Prevention Plan 02/23/2019 10/15/2017  Transportation Screening Complete Complete  PCP or Specialist Appt within 5-7 Days Not Complete -  Not Complete comments pt is SNF resident -  Home Care Screening Not Complete Complete  Home Care Screening Not Completed Comments pt is SNF resident -  Medication Review (RN CM) Referral to [place] or [place] - Complete  Medication Review (RN Care Manager) - Complete  Some recent data might be hidden

## 2019-02-28 NOTE — Plan of Care (Signed)
Patient progressing towards plan of care goals. 

## 2019-02-28 NOTE — Progress Notes (Signed)
Modified Barium Swallow Progress Note  Patient Details  Name: Tyrone Cunningham MRN: [id number] Date of Birth: 11-Feb-1956  Today's Date: 02/28/2019  Modified Barium Swallow completed.  Full report located under Chart Review in the Imaging Section.  Brief recommendations include the following:  Clinical Impression  Pt presents with much improved swallow function marked by persisting but improved oral dysphagia, with only mild anterior loss. Pharyngeal phase significantly improved with improved timeliness of swallow onset, reliable laryngeal vestibule closure with large, successive swallows of nectar (no penetration/aspiration), and only one incident of penetration with thin liquids; no aspiration. Pt is safe to begin a dysphagia 1 diet with thin liquids; PMV must be in place for all POs; crush meds.     Swallow Evaluation Recommendations       SLP Diet Recommendations: Dysphagia 1 (Puree) solids;Thin liquid   Liquid Administration via: Cup   Medication Administration: Crushed with puree       Compensations: Minimize environmental distractions;Small sips/bites       Oral Care Recommendations: Oral care BID      [name] L. Tivis Ringer, [place] Office number [phone] Pager [phone]   [name] 02/28/2019,4:11 PM

## 2019-02-28 NOTE — Progress Notes (Signed)
Telemetry called to report pt having 15 beats run of wide QRS but pt asymptomatic and sleeping. NP on-call paged and notified; new orders received and will continue to closely monitor. Delia Heady RN

## 2019-02-28 NOTE — Progress Notes (Signed)
Nutrition Follow-up  DOCUMENTATION CODES:   Morbid obesity  INTERVENTION:   -D/c Osmolite 1.5, due to no feeding access -D/c 30 ml Prostat, due to no feeding access -Magic cup TID with meals, each supplement provides 290 kcal and 9 grams of protein -Ensure Enlive po BID, each supplement provides 350 kcal and 20 grams of protein -MVI with minerals daily  NUTRITION DIAGNOSIS:   Inadequate oral intake related to inability to eat as evidenced by estimated needs.  Ongoing  GOAL:   Patient will meet greater than or equal to 90% of their needs  Progressing   MONITOR:   PO intake, Supplement acceptance, Diet advancement, Labs, Weight trends, Skin, I & O's  REASON FOR ASSESSMENT:   Consult Enteral/tube feeding initiation and management  ASSESSMENT:   63 y.o. male with past medical history significant of chronic CHF, diabetes, chronic pain syndrome, chronic trach, was brought to ED via EMS from [place] facility for evaluation of altered mental status.CT head: Findings consistent with age related atrophy and chronic small vessel ischemia. Findings compatible with subacute extension of left PCA infarct. Chronic infarct right PCA territory unchanged. Chronic left PICA infarct unchanged. Negative for acute hemorrhage.  12/27- pulled out NGT 12/28- s/p MBSS- advanced to dysphagia 1 diet with thin liquids  Reviewed I/O's: +3.4 L x 24 hours and +11.1 L since admission  UOP: 163 ml x 24 hours  Pt resting quietly at time of visit. RD did not disturb.   Pt passed MBSS today and advanced to a dysphagia 1 diet with thin liquids.   Palliative care following for goals of care; plan to d/c to SNF with palliative care follow-up once medically stable.   Medications reviewed and include dextrose 5%-0.45% sodium chloride infusion @ 75 ml/hr.   Labs reviewed: Na: 146, CBGS: 85-101 (inpatient orders for glycemic control are 0-5 units insulin aspart q HS, 0-9 units insulin aspart  TID with meals, and 10 units insulin glargine daily).   Diet Order:   Diet Order            DIET - DYS 1 Room service appropriate? No; Fluid consistency: Thin  Diet effective now              EDUCATION NEEDS:   Not appropriate for education at this time  Skin:  Skin Assessment: Reviewed RN Assessment  Last BM:  02/27/19  Height:   Ht Readings from Last 1 Encounters:  02/19/19 5\' 7"  (1.702 m)    Weight:   Wt Readings from Last 1 Encounters:  02/28/19 121.5 kg    Ideal Body Weight:  67.27 kg  BMI:  Body mass index is 41.95 kg/m.  Estimated Nutritional Needs:   Kcal:  2000-2200  Protein:  105-125 grams  Fluid:  >/= 2 L/day    Xzandria Clevinger A. Jimmye Norman, RD, LDN, [place] Registered Dietitian II Certified Diabetes Care and Education Specialist Pager: [phone] After hours Pager: [phone]

## 2019-02-28 NOTE — Progress Notes (Signed)
Cortrak Tube Team Note:  Consult received to place a Cortrak feeding tube.   Pt with repeat MBS today that resulted in diet advancement. Plan to hold on Cortrak placement at this time per RN. RD has had oral nutrition supplements to optimize nutrition.   Cortrak order has been discontinued. Please re-consult if needed  Kerman Passey MS, RDN, [place], CNSC [phone] Pager  [phone] Weekend/On-Call Pager

## 2019-02-28 NOTE — Plan of Care (Signed)
Note reviewed. Pt passed swallow today, will start DAPT with ASA 325mg  and plavix 75 for 3 months due to intracranial stenosis and then ASA alone. Also started low dose statin as his LDL within the goal. Discussed with Dr. Lonny Prude.   Rosalin Hawking, MD PhD Stroke Neurology 02/28/2019 4:32 PM

## 2019-03-01 LAB — CBC
HCT: 40 % (ref 39.0–52.0)
Hemoglobin: 13 g/dL (ref 13.0–17.0)
MCH: 30.3 pg (ref 26.0–34.0)
MCHC: 32.5 g/dL (ref 30.0–36.0)
MCV: 93.2 fL (ref 80.0–100.0)
Platelets: 220 10*3/uL (ref 150–400)
RBC: 4.29 MIL/uL (ref 4.22–5.81)
RDW: 13.5 % (ref 11.5–15.5)
WBC: 12.9 10*3/uL — ABNORMAL HIGH (ref 4.0–10.5)
nRBC: 0.2 % (ref 0.0–0.2)

## 2019-03-01 LAB — GLUCOSE, CAPILLARY
Glucose-Capillary: 177 mg/dL — ABNORMAL HIGH (ref 70–99)
Glucose-Capillary: 150 mg/dL — ABNORMAL HIGH (ref 70–99)
Glucose-Capillary: 144 mg/dL — ABNORMAL HIGH (ref 70–99)
Glucose-Capillary: 160 mg/dL — ABNORMAL HIGH (ref 70–99)
Glucose-Capillary: 155 mg/dL — ABNORMAL HIGH (ref 70–99)

## 2019-03-01 LAB — BASIC METABOLIC PANEL
Anion gap: 9 (ref 5–15)
BUN: 17 mg/dL (ref 8–23)
CO2: 28 mmol/L (ref 22–32)
Calcium: 8.5 mg/dL — ABNORMAL LOW (ref 8.9–10.3)
Chloride: 108 mmol/L (ref 98–111)
Creatinine, Ser: 0.87 mg/dL (ref 0.61–1.24)
GFR calc Af Amer: >60 mL/min (ref >60)
GFR calc non Af Amer: >60 mL/min (ref >60)
Glucose, Bld: 171 mg/dL — ABNORMAL HIGH (ref 70–99)
Potassium: 3.2 mmol/L — ABNORMAL LOW (ref 3.5–5.1)
Sodium: 145 mmol/L (ref 135–145)

## 2019-03-01 MED ORDER — RESOURCE THICKENUP CLEAR PO POWD
Freq: Once | ORAL | Status: DC
  Filled 2019-03-01: qty 125

## 2019-03-01 MED ORDER — POTASSIUM CHLORIDE CRYS ER 20 MEQ PO TBCR
40.0000 meq | EXTENDED_RELEASE_TABLET | Freq: Once | ORAL | Status: AC
  Administered 2019-03-01: 40 meq via ORAL
  Filled 2019-03-01: qty 2

## 2019-03-01 MED ORDER — METOPROLOL TARTRATE 12.5 MG HALF TABLET
12.5000 mg | ORAL_TABLET | Freq: Four times a day (QID) | ORAL | Status: DC
  Administered 2019-03-01 – 2019-03-07 (×23): 12.5 mg via ORAL
  Filled 2019-03-01 (×23): qty 1

## 2019-03-01 NOTE — Progress Notes (Signed)
  Speech Language Pathology Treatment: Dysphagia  Patient Details Name: Tyrone Cunningham MRN: [id number] DOB: 19-Jan-1956 Today's Date: 03/01/2019 Time: 9798-9211 SLP Time Calculation (min) (ACUTE ONLY): 22 min  Assessment / Plan / Recommendation Clinical Impression  F/u after yesterday's MBS.  Pt did not aspirate on thin liquids during study, but functionally, today during our session he coughed consistently after drinking thin liquids.  Nectars were consumed with no overt s/s of aspiration. Pt requires verbal cues to seal lips around spoon and straw during consumption.  Pt with poor awareness of spillage from right oral cavity, requires cues for attention. When using straw, clinician must manually pinch straw to prevent impulsive drinking and to minimize coughing episodes.  Pt used PMV during session with excellent toleration.  Recommend continuing dysphagia 1, change liquids to nectar; provide 1:1 assist with feeding and encourage self-feeding as able. D/W RN.    HPI HPI: Tyrone Cunningham is a 63 y.o. male with medical history significant of CHF, diabetes, chronic pain syndrome, was brought to ED via EMS from [place] facility for evaluation of altered mental status. Per nursing home staff, pt appears to have right sided facial drooping, and right-sided weakness. 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  No previous ST notes in Epic.  Pt reported that he does not wear a PMV and usually finger occludes or mouths words to communicate.  MBS 02/22/19 with recs for NPO with AMN      SLP Plan  Continue with current plan of care       Recommendations  Diet recommendations: Dysphagia 1 (puree);Nectar-thick liquid Liquids provided via: Cup;Straw Medication Administration: Crushed with puree Supervision: Full supervision/cueing for compensatory strategies Compensations: Minimize environmental distractions;Small sips/bites      Patient may use Passy-Muir Speech  Valve: Intermittently with supervision PMSV Supervision: Full         Oral Care Recommendations: Oral care BID Follow up Recommendations: Skilled Nursing facility SLP Visit Diagnosis: Dysphagia, oropharyngeal phase (R13.12);[place]phonia (R49.1) Plan: Continue with current plan of care       GO                Assunta Curtis 03/01/2019, 11:48 AM  Estill Bamberg L. Tivis Ringer, [place] Office number [phone] Pager [phone]

## 2019-03-01 NOTE — Progress Notes (Signed)
At Pt had not voided spontaneously from 0700 through 1430. I asked patient if he felt like he needed to urinate multiple times, he said "no" and "I don't know" and "no I don't have to". Bladder scan completed showed >480cc urine. MD Nettey notified, and verbal orders received for in and out cath with urine culture, however when I returned to the patients room with supplies for in and out cath he had voided a large amount in the bed. Unable to obtain urine sample at this time. Will continue to attempt.

## 2019-03-01 NOTE — Progress Notes (Signed)
PROGRESS NOTE    Tyrone Cunningham  PYK:[id number] DOB: February 08, 1956 DOA: 02/19/2019 PCP: System, Pcp Not In   Brief Narrative: Tyrone Cunningham is a 63 y.o. malewith past medical history significant ofchronic CHF, diabetes, chronic pain syndrome,chronic trach. Patient presented secondary to altered mental status and found to have an acute stroke in addition to pneumonia.   Assessment & Plan:   Active Problems:   Hypertension   Tracheostomy dependent ([place])   DM (diabetes mellitus), type 2 with neurological complications ([place])   Palliative care encounter   Stroke (cerebrum) (HCC)   Chronic diastolic CHF (congestive heart failure) (HCC)   Hemiparesis of right dominant side (HCC)   Aspiration pneumonia of left lung (HCC)   Acute stroke Associated right sided hemiplegia and facial droop. 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 Resultant cortical blindness. -Neurology recommendations: DAPT for 3 months followed by aspirin alone. Signed off -PT/OT recommending SNF -Aspirin/Plavix -Lipitor 20 mg daily  Dysphagia In setting of stroke. Failed swallow study. Pulled out NG tube and does not want it reinserted. Patient amenable to PEG tube. MBS on 12/28 was favorable and patient started on a Dysphagia 1 diet. -Continued speech therapy recommendations  Confusion Waxes and wanes.  Sepsis Secondary to pneumonia. Not present on admission. Physiology resolved.  Abdominal pain Unknown etiology. Associated distension. Abdominal x-ray unremarkable.  Hypernatremia Iatrogenic in setting of normal saline. Resolved. IV fluids discontinued now that patient has a diet  Right lower lobe pneumonia Patient empirically started on Vancomycin, Ceftriaxone and Flagyl. Completed course.  Positive blood culture 1/4 bottles positive with GPC now found to be coagulase negative staph species. Likely contamination. Vancomycin  discontinued.  Essential hypertension Patient is on lisinopril and metoprolol as an outpatient. Held on admission for permissive hypertension. -Continue home metoprolol (switch to PO today)  Chronic diastolic heart failure Stable. -Continue Digoxin  Diabetes mellitus, type 2 Insulin dependent. Hemoglobin A1C of 8.3%. -Continue Lantus and SSI  OSA Chronic respiratory failure with hypercapnia Patient is s/p tracheostomy  Lung nodule Incidental finding. 9mm in size. Non-contrast CT in 12 months if high risk. Will need outpatient follow-up.  Goals of care Palliative care consulted and after discussions with patient and family, patient will remain full code with full scope care.   DVT prophylaxis: Lovenox Code Status:   Code Status: Full Code Family Communication: None Disposition Plan: Discharge pending goals of care discussions   Consultants:   Neurology  Palliative care medicine  Procedures:   12/20: Transthoracic Echocardiogram IMPRESSIONS    1. Left ventricular ejection fraction, by visual estimation, is 55 to 60%. The left ventricle has normal function. There is no left ventricular hypertrophy.  2. Definity contrast agent was given IV to delineate the left ventricular endocardial borders.  3. Left ventricular diastolic function could not be evaluated.  4. The left ventricle has no regional wall motion abnormalities.  5. Global right ventricle was not well visualized.The right ventricular size is not well visualized. Right vetricular wall thickness was not assessed.  6. Left atrial size was not well visualized.  7. Right atrial size was not well visualized.  8. The mitral valve was not well visualized. No evidence of mitral valve regurgitation.  9. The tricuspid valve is not well visualized. Tricuspid valve regurgitation is not demonstrated. 10. The aortic valve was not well visualized. Aortic valve regurgitation is not visualized. 11. The pulmonic valve was not  well visualized. Pulmonic valve regurgitation is  not visualized. 12. The aortic root was not well visualized. 13. Very technically difficult study, even with use of echo contrast. Contrast shows normal EF without significant wall motion abnormalities. Limited ability to assess any other features due to body habitus and limited echo windows. 14. The interatrial septum was not well visualized.  Antimicrobials:  Vancomycin  Cefepime  Flagyl    Subjective: No issues  Objective: Vitals:   03/01/19 0814 03/01/19 1020 03/01/19 1122 03/01/19 1151  BP:    (!) 156/73  Pulse: 98 97 (!) 103 98  Resp: 18  20 15   Temp:    (!) 97.4 F (36.3 C)  TempSrc:    Axillary  SpO2: 92%  94% 96%  Weight:      Height:        Intake/Output Summary (Last 24 hours) at 03/01/2019 1207 Last data filed at 03/01/2019 0126 Gross per 24 hour  Intake 1293.99 ml  Output --  Net 1293.99 ml   Filed Weights   02/26/19 0500 02/28/19 0500 03/01/19 0451  Weight: 118.8 kg 121.5 kg 121.2 kg    Examination:  General exam: Appears calm and comfortable  Respiratory system: Clear to auscultation. Respiratory effort normal. Cardiovascular system: S1 & S2 heard, RRR. No murmurs, rubs, gallops or clicks. Gastrointestinal system: Abdomen is nondistended, soft and nontender. No organomegaly or masses felt. Normal bowel sounds heard. Central nervous system: Alert. 3/5 strength of left side Extremities: edema. No calf tenderness Skin: No cyanosis. No rashes Psychiatry: Judgement and insight appear impaired.   Data Reviewed: I have personally reviewed following labs and imaging studies  CBC: Recent Labs  Lab 02/23/19 0247 02/28/19 1529 03/01/19 0457  WBC 15.1* 13.5* 12.9*  HGB 12.7* 14.0 13.0  HCT 39.4 42.7 40.0  MCV 93.4 93.4 93.2  PLT 188 208 220   Basic Metabolic Panel: Recent Labs  Lab 02/23/19 0247 02/28/19 0052 03/01/19 0457  NA 143 146* 145  K 3.4* 3.5 3.2*  CL 106 110 108  CO2 26 27 28    GLUCOSE 115* 114* 171*  BUN 22 17 17   CREATININE 1.03 0.96 0.87  CALCIUM 8.2* 8.2* 8.5*  MG  --  2.3  --    GFR: Estimated Creatinine Clearance: 105.5 mL/min (by C-G formula based on SCr of 0.87 mg/dL). Liver Function Tests: No results for input(s): AST, ALT, ALKPHOS, BILITOT, PROT, ALBUMIN in the last 168 hours. No results for input(s): LIPASE, AMYLASE in the last 168 hours. No results for input(s): AMMONIA in the last 168 hours. Coagulation Profile: No results for input(s): INR, PROTIME in the last 168 hours. Cardiac Enzymes: No results for input(s): CKTOTAL, CKMB, CKMBINDEX, TROPONINI in the last 168 hours. BNP (last 3 results) No results for input(s): PROBNP in the last 8760 hours. HbA1C: No results for input(s): HGBA1C in the last 72 hours. CBG: Recent Labs  Lab 02/28/19 2007 02/28/19 2348 03/01/19 0407 03/01/19 0737 03/01/19 1154  GLUCAP 141* 142* 177* 150* 144*   Lipid Profile: No results for input(s): CHOL, HDL, LDLCALC, TRIG, CHOLHDL, LDLDIRECT in the last 72 hours. Thyroid Function Tests: No results for input(s): TSH, T4TOTAL, FREET4, T3FREE, THYROIDAB in the last 72 hours. Anemia Panel: No results for input(s): VITAMINB12, FOLATE, FERRITIN, TIBC, IRON, RETICCTPCT in the last 72 hours. Sepsis Labs: No results for input(s): PROCALCITON, LATICACIDVEN in the last 168 hours.  Recent Results (from the past 240 hour(s))  SARS CORONAVIRUS 2 (TAT 6-24 HRS) Nasopharyngeal Nasopharyngeal Swab     Status: None   Collection  Time: 02/19/19  3:44 PM   Specimen: Nasopharyngeal Swab  Result Value Ref Range Status   SARS Coronavirus 2 NEGATIVE NEGATIVE Final    Comment: (NOTE) SARS-CoV-2 target nucleic acids are NOT DETECTED. The SARS-CoV-2 RNA is generally detectable in upper and lower respiratory specimens during the acute phase of infection. Negative results do not preclude SARS-CoV-2 infection, do not rule out co-infections with other pathogens, and should not be used  as the sole basis for treatment or other patient management decisions. Negative results must be combined with clinical observations, patient history, and epidemiological information. The expected result is Negative. Fact Sheet for Patients: HairSlick.no Fact Sheet for Healthcare Providers: quierodirigir.com This test is not yet approved or cleared by the [place] FDA and  has been authorized for detection and/or diagnosis of SARS-CoV-2 by FDA under an Emergency Use Authorization (EUA). This EUA will remain  in effect (meaning this test can be used) for the duration of the COVID-19 declaration under Section 56 4(b)(1) of the Act, 21 U.S.C. section 360bbb-3(b)(1), unless the authorization is terminated or revoked sooner. Performed at [facility], [address] [place], [place] [address]   MRSA culture     Status: None   Collection Time: 02/20/19  1:50 AM  Result Value Ref Range Status   Specimen Description NASAL SWAB  Final   Special Requests NONE  Final   Culture   Final    MRSA DETECTED Performed at [facility], [address] [place], [place] [address]    Report Status 02/22/2019 FINAL  Final  Culture, blood (routine x 2)     Status: None   Collection Time: 02/21/19 10:30 AM   Specimen: BLOOD RIGHT HAND  Result Value Ref Range Status   Specimen Description BLOOD RIGHT HAND  Final   Special Requests   Final    BOTTLES DRAWN AEROBIC ONLY Blood Culture adequate volume   Culture   Final    NO GROWTH 5 DAYS Performed at [facility], [address] [place], [place] [address]    Report Status 02/26/2019 FINAL  Final  Culture, blood (routine x 2)     Status: Abnormal   Collection Time: 02/21/19 10:38 AM   Specimen: BLOOD  Result Value Ref Range Status   Specimen Description BLOOD RIGHT ANTECUBITAL  Final   Special Requests   Final    BOTTLES DRAWN AEROBIC ONLY Blood Culture adequate volume    Culture  Setup Time   Final    GRAM POSITIVE COCCI IN CLUSTERS AEROBIC BOTTLE ONLY CRITICAL RESULT CALLED TO, READ BACK BY AND VERIFIED WITH: TRubin Payor PHARMD, AT 9147 02/22/19 BY D. VANHOOK    Culture (A)  Final    STAPHYLOCOCCUS SPECIES (COAGULASE NEGATIVE) THE SIGNIFICANCE OF ISOLATING THIS ORGANISM FROM A SINGLE SET OF BLOOD CULTURES WHEN MULTIPLE SETS ARE DRAWN IS UNCERTAIN. PLEASE NOTIFY THE MICROBIOLOGY DEPARTMENT WITHIN ONE WEEK IF SPECIATION AND SENSITIVITIES ARE REQUIRED. Performed at [facility], [address] [place], [place] [address]    Report Status 02/25/2019 FINAL  Final         Radiology Studies: DG Abd Portable 1V  Result Date: 02/28/2019 CLINICAL DATA:  Abdominal distention EXAM: PORTABLE ABDOMEN - 1 VIEW COMPARISON:  02/23/2019 FINDINGS: Prior cholecystectomy. Nonobstructive bowel gas pattern. No organomegaly or free air. IMPRESSION: No acute findings.  No evidence of bowel obstruction. Electronically Signed   By: Charlett Nose M.D.   On: 02/28/2019 12:20  DG Swallowing Func-Speech Pathology  Result Date: 02/28/2019 Objective Swallowing Evaluation: Type of Study: MBS-Modified Barium Swallow Study  Patient Details Name: [name] Eckenrode MRN: [id number] Date of Birth: 11/04/1955 Today's Date: 02/28/2019 Time: SLP Start Time (ACUTE ONLY): 1440 -SLP Stop Time (ACUTE ONLY): 1502 SLP Time Calculation (min) (ACUTE ONLY): 22 min Past Medical History: Past Medical History: Diagnosis Date  Bronchitis   CHF (congestive heart failure) (HCC)   grade 1 diastolic with preserved EF in [id number]  COPD (chronic obstructive pulmonary disease) (HCC)   Diabetes mellitus without complication (HCC)   DM type 2 with diabetic peripheral neuropathy (HCC)   Hearing loss   Hypercholesteremia   Hypertension   Peripheral neuropathy   Sleep apnea   Severe sleep apnea requiring tracheostomy Past Surgical History: Past Surgical History: Procedure Laterality Date  APPENDECTOMY     CHOLECYSTECTOMY N/A 01/05/2014  Procedure: LAPAROSCOPIC CHOLECYSTECTOMY;  Surgeon: Emelia LoronMatthew Wakefield, MD;  Location: [facility] ORS;  Service: General;  Laterality: N/A;  FEMUR HARDWARE REMOVAL  09/01/2003  Removal of retained hardware, two interlocking distal femoral  FEMUR SURGERY    HAND SURGERY    MULTIPLE EXTRACTIONS WITH ALVEOLOPLASTY N/A 05/11/2012  Procedure: MULTIPLE EXTRACTION WITH ALVEOLOPLASTY;  Surgeon: Georgia LopesScott M Jensen, DDS;  Location: [facility] OR;  Service: Oral Surgery;  Laterality: N/A;  TRACHEOSTOMY   HPI: [name] Pavek is a 63 y.o. male with medical history significant of CHF, diabetes, chronic pain syndrome, was [facility] ED [facility] for [facility] of altered mental status. Per nursing home staff, pt appears to have right sided facial drooping, and right-sided weakness. 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  No previous ST notes in Epic.  Pt reported that he does not wear a PMV and usually finger occludes or mouths words to communicate.  MBS 02/22/19 with recs for NPO with AMN  Subjective: alert Assessment / Plan / Recommendation CHL IP CLINICAL IMPRESSIONS 02/28/2019 Clinical Impression Pt presents with much improved swallow function marked by persisting but improved oral dysphagia, with only mild anterior loss. Pharyngeal phase significantly improved with improved timeliness of swallow onset, reliable laryngeal vestibule closure with large, successive swallows of nectar (no penetration/aspiration), and only one incident of penetration with thin liquids; no aspiration. Pt is safe to begin a dysphagia 1 diet with thin liquids; PMV must be in place for all POs; crush meds.   SLP Visit Diagnosis Dysphagia, oropharyngeal phase (R13.12);Aphonia (R49.1) Attention and concentration deficit following -- Frontal lobe and executive function deficit following -- Impact on safety and function Mild aspiration risk   CHL IP TREATMENT RECOMMENDATION  02/28/2019 Treatment Recommendations Therapy as outlined in treatment plan below   Prognosis 02/28/2019 Prognosis for Safe Diet Advancement Good Barriers to Reach Goals -- Barriers/Prognosis Comment -- CHL IP DIET RECOMMENDATION 02/28/2019 SLP Diet Recommendations Dysphagia 1 (Puree) solids;Thin liquid Liquid Administration via Cup Medication Administration Crushed with puree Compensations Minimize environmental distractions;Small sips/bites Postural Changes --   CHL IP OTHER RECOMMENDATIONS 02/28/2019 Recommended Consults -- Oral Care Recommendations Oral care BID Other Recommendations --   CHL IP FOLLOW UP RECOMMENDATIONS 02/23/2019 Follow up [facility] D[facility] 02/22/2019 Speech Therapy Frequency (ACUTE ONLY) min 2x/week Treatment Duration 2 weeks      CHL IP ORAL PHASE 02/28/2019 Oral Phase Impaired Oral - Pudding Teaspoon -- Oral - Pudding Cup -- Oral - Honey Teaspoon -- Oral - Honey Cup -- Oral - Nectar Teaspoon -- Oral -  Nectar Cup -- Oral - Nectar Straw -- Oral - Thin Teaspoon -- Oral - Thin Cup -- Oral - Thin Straw -- Oral - Puree -- Oral - Mech Soft -- Oral - Regular -- Oral - Multi-Consistency -- Oral - Pill -- Oral Phase - Comment --  CHL IP PHARYNGEAL PHASE 02/28/2019 Pharyngeal Phase Impaired Pharyngeal- Pudding Teaspoon -- Pharyngeal -- Pharyngeal- Pudding Cup -- Pharyngeal -- Pharyngeal- Honey Teaspoon -- Pharyngeal -- Pharyngeal- Honey Cup -- Pharyngeal -- Pharyngeal- Nectar Teaspoon -- Pharyngeal -- Pharyngeal- Nectar Cup Delayed swallow initiation-pyriform sinuses;Pharyngeal residue - valleculae Pharyngeal Material does not enter airway Pharyngeal- Nectar Straw Delayed swallow initiation-vallecula;Pharyngeal residue - valleculae Pharyngeal Material does not enter airway Pharyngeal- Thin Teaspoon -- Pharyngeal -- Pharyngeal- Thin Cup Delayed swallow initiation-pyriform sinuses Pharyngeal Material does not enter airway Pharyngeal- Thin Straw  Delayed swallow initiation-pyriform sinuses;Penetration/Aspiration before swallow Pharyngeal Material enters airway, remains ABOVE vocal cords and not ejected out Pharyngeal- Puree -- Pharyngeal -- Pharyngeal- Mechanical Soft -- Pharyngeal -- Pharyngeal- Regular -- Pharyngeal -- Pharyngeal- Multi-consistency -- Pharyngeal -- Pharyngeal- Pill -- Pharyngeal -- Pharyngeal Comment --  No flowsheet data found. Blenda Mounts Laurice 02/28/2019, 4:13 PM                   Scheduled Meds:   stroke: mapping our early stages of recovery book   Does not apply Once   aspirin EC  325 mg Oral Daily   atorvastatin  20 mg Oral q1800   budesonide  0.5 mg Nebulization BID   clopidogrel  75 mg Oral Daily   digoxin  0.25 mg Intravenous Daily   enoxaparin (LOVENOX) injection  60 mg Subcutaneous Q24H   feeding supplement (ENSURE ENLIVE)  237 mL Oral BID BM   insulin aspart  0-5 Units Subcutaneous QHS   insulin aspart  0-9 Units Subcutaneous TID WC   insulin glargine  10 Units Subcutaneous Daily   ipratropium-albuterol  3 mL Nebulization TID   metoprolol tartrate  2.5 mg Intravenous Q6H   multivitamin with minerals  1 tablet Oral Daily   Resource ThickenUp Clear   Oral Once   sodium chloride flush  10-40 mL Intracatheter Q12H   Continuous Infusions:  sodium chloride Stopped (02/28/19 2112)   dextrose 5 % and 0.45% NaCl 75 mL/hr at 03/01/19 1133   valproate sodium 500 mg (03/01/19 0610)     LOS: 10 days     Jacquelin Hawking, MD [facility] 03/01/2019, 12:07 PM  If 7PM-7AM, please contact night-coverage [email or web address]

## 2019-03-01 NOTE — Progress Notes (Signed)
Physical Therapy Treatment Patient Details Name: Tyrone Cunningham MRN: [id number] DOB: 1955/03/21 Today's Date: 03/01/2019    History of Present Illness Tyrone Cunningham is a 63 y.o. male with PMHx: chronic CHF, diabetes, chronic pain syndrome, chronic trach, was brought to ED via EMS from [facility] for AMS with R sided facial drooping, R-sided weakness. Pt is usually bedbound but can walk on occasion; Pt is trach dependent. 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 Incidental lung nodule found.    PT Comments    Pt supine in bed, patient is able to progress to edge of bed with 2 skilled therapists.  He required assistance to maintain sitting and to perform ADLs in sitting.  Based on continued slow progress he remains appropriate for SNF placement.      Follow Up Recommendations  SNF     Equipment Recommendations  None recommended by PT    Recommendations for Other Services       Precautions / Restrictions Precautions Precautions: Fall;Other (comment) Precaution Comments: trach dependent, PMV during PT Restrictions Weight Bearing Restrictions: No    Mobility  Bed Mobility Overal bed mobility: Needs Assistance Bed Mobility: Rolling Rolling: Max assist;Total assist;+2 for physical assistance Sidelying to sit: +2 for physical assistance;+2 for safety/equipment;Max assist       General bed mobility comments: Pt performed rolling and sidelying to sit.  He used R side of body to assist in coming to a seated position.  He required use of tray table to support RUE and improve sitting balance.  Pt able to sit for majority of session.  Transfers                 General transfer comment: deferred, pt will need lift for safety  Ambulation/Gait                 Stairs             Wheelchair Mobility    Modified Rankin (Stroke Patients Only)       Balance Overall balance assessment: Needs assistance Sitting-balance  support: Feet unsupported;Single extremity supported Sitting balance-Leahy Scale: Poor Sitting balance - Comments: leans with cues, but lists to Right and falls if unasssited     Standing balance-Leahy Scale: Zero Standing balance comment: unable                            Cognition Arousal/Alertness: Awake/alert Behavior During Therapy: Flat affect Overall Cognitive Status: No family/caregiver present to determine baseline cognitive functioning                                 General Comments: RN reports HOH but difficult to tell if he didnt understand or he didnt want to participate.      Exercises General Exercises - Lower Extremity Long Arc Quad: AROM;Left;10 reps;Seated    General Comments        Pertinent Vitals/Pain Pain Assessment: No/denies pain Faces Pain Scale: No hurt Pain Location: unable to state Pain Descriptors / Indicators: Grimacing Pain Intervention(s): Monitored during session;Repositioned    Home Living                      Prior Function            PT Goals (current goals can now be found in the care plan  section) Acute Rehab PT Goals Patient Stated Goal: to get stronger on R side Potential to Achieve Goals: Fair Progress towards PT goals: Progressing toward goals    Frequency    Min 2X/week      PT Plan Current plan remains appropriate    Co-evaluation PT/OT/SLP Co-Evaluation/Treatment: Yes Reason for Co-Treatment: Complexity of the patient's impairments (multi-system involvement) PT goals addressed during session: Mobility/safety with mobility OT goals addressed during session: ADL's and self-care      AM-PAC PT "6 Clicks" Mobility   Outcome Measure  Help needed turning from your back to your side while in a flat bed without using bedrails?: A Lot Help needed moving from lying on your back to sitting on the side of a flat bed without using bedrails?: Total Help needed moving to and from a bed  to a chair (including a wheelchair)?: Total Help needed standing up from a chair using your arms (e.g., wheelchair or bedside chair)?: Total Help needed to walk in hospital room?: Total Help needed climbing 3-5 steps with a railing? : Total 6 Click Score: 7    End of Session Equipment Utilized During Treatment: Oxygen Activity Tolerance: Patient limited by fatigue;Patient limited by lethargy Patient left: in bed;with call bell/phone within reach;with bed alarm set(placed in chair position with speech therapy present.)   PT Visit Diagnosis: Hemiplegia and hemiparesis;Muscle weakness (generalized) (M62.81);Other abnormalities of gait and mobility (R26.89) Hemiplegia - Right/Left: Right Hemiplegia - dominant/non-dominant: Non-dominant     Time: 7893-8101 PT Time Calculation (min) (ACUTE ONLY): 31 min  Charges:  $Therapeutic Activity: 8-22 mins                     Bonney Leitz , PTA Acute Rehabilitation Services Pager [phone] Office [phone]     Elona Yinger Artis Delay 03/01/2019, 2:21 PM

## 2019-03-01 NOTE — Progress Notes (Signed)
Occupational Therapy Treatment Patient Details Name: Dracen Reigle MRN: [id number] DOB: Sep 26, 1955 Today's Date: 03/01/2019    History of present illness Tyrone Cunningham is a 63 y.o. male with PMHx: chronic CHF, diabetes, chronic pain syndrome, chronic trach, was brought to ED via EMS from [facility] for AMS with R sided facial drooping, R-sided weakness. Pt is usually bedbound but can walk on occasion; Pt is trach dependent. 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 Incidental lung nodule found.   OT comments  Pt making steady progress towards OT goals this session. Session focus on sitting balance, seated grooming and bed mobility as precursor to higher level BADLs. Pt able to progress to EOB with MAX A - total A +2 and sit EOB with MOD A. Pt completed seated grooming with set-up/ supervision. DC plan remains appropriate, will continue to follow acutely per POC.    Follow Up Recommendations  SNF;Supervision/Assistance - 24 hour    Equipment Recommendations  Other (comment)(defer to next venue of care)    Recommendations for Other Services      Precautions / Restrictions Precautions Precautions: Fall;Other (comment) Precaution Comments: trach dependent, PMV during PT Restrictions Weight Bearing Restrictions: No       Mobility Bed Mobility Overal bed mobility: Needs Assistance Bed Mobility: Rolling;Sidelying to Sit;Sit to Sidelying Rolling: Max assist;Total assist;+2 for physical assistance Sidelying to sit: +2 for physical assistance;+2 for safety/equipment;Max assist     Sit to sidelying: Max assist;+2 for physical assistance;+2 for safety/equipment General bed mobility comments: Pt performed rolling and sidelying to sit.  He used R side of body to assist in coming to a seated position.  He required use of tray table to support RUE and improve sitting balance.  Pt able to sit for majority of session.  Transfers                  General transfer comment: deferred, pt will need lift for safety    Balance Overall balance assessment: Needs assistance Sitting-balance support: Feet unsupported;Single extremity supported Sitting balance-Leahy Scale: Poor Sitting balance - Comments: leans with cues, but lists to Right and falls if unasssited     Standing balance-Leahy Scale: Zero Standing balance comment: unable                           ADL either performed or assessed with clinical judgement   ADL Overall ADL's : Needs assistance/impaired     Grooming: Wash/dry face;Supervision/safety;Set up Grooming Details (indicate cue type and reason): initially hand over hand progressing to set- up supervision             Lower Body Dressing: Total assistance;+2 for physical assistance;+2 for safety/equipment;Sitting/lateral leans;Bed level     Toilet Transfer Details (indicate cue type and reason): deferred         Functional mobility during ADLs: Maximal assistance;Total assistance;+2 for safety/equipment General ADL Comments: session focus on sitting balance and seated grooming tasks.     Vision       Perception     Praxis      Cognition Arousal/Alertness: Awake/alert Behavior During Therapy: Flat affect Overall Cognitive Status: No family/caregiver present to determine baseline cognitive functioning                                 General Comments: RN reports HOH but difficult to  tell if he didnt understand or he didnt want to participate.        Exercises   Shoulder Instructions       General Comments      Pertinent Vitals/ Pain       Pain Assessment: No/denies pain Faces Pain Scale: No hurt Pain Location: unable to state Pain Descriptors / Indicators: Grimacing Pain Intervention(s): Monitored during session;Repositioned  Home Living                                          Prior Functioning/Environment              Frequency   Min 2X/week        Progress Toward Goals  OT Goals(current goals can now be found in the care plan section)  Progress towards OT goals: Progressing toward goals  Acute Rehab OT Goals Patient Stated Goal: to get stronger on R side OT Goal Formulation: With patient Time For Goal Achievement: 03/06/19 Potential to Achieve Goals: Good  Plan Discharge plan remains appropriate    Co-evaluation      Reason for Co-Treatment: Complexity of the patient's impairments (multi-system involvement) PT goals addressed during session: Mobility/safety with mobility OT goals addressed during session: ADL's and self-care      AM-PAC OT "6 Clicks" Daily Activity     Outcome Measure   Help from another person eating meals?: Total Help from another person taking care of personal grooming?: A Lot Help from another person toileting, which includes using toliet, bedpan, or urinal?: Total Help from another person bathing (including washing, rinsing, drying)?: Total Help from another person to put on and taking off regular upper body clothing?: A Lot Help from another person to put on and taking off regular lower body clothing?: Total 6 Click Score: 8    End of Session Equipment Utilized During Treatment: Oxygen  OT Visit Diagnosis: Unsteadiness on feet (R26.81);Muscle weakness (generalized) (M62.81);Pain;Hemiplegia and hemiparesis Hemiplegia - Right/Left: Right Hemiplegia - caused by: Cerebral infarction Pain - Right/Left: Right Pain - part of body: Leg   Activity Tolerance Patient tolerated treatment well;Patient limited by fatigue   Patient Left in bed;with call bell/phone within reach;Other (comment)(SLP entering)   Nurse Communication Mobility status        Time: 9532-0233 OT Time Calculation (min): 30 min  Charges: OT General Charges $OT Visit: 1 Visit OT Treatments $Self Care/Home Management : 8-22 mins  Audery Amel., COTA/L Acute Rehabilitation  Services [phone]    Angelina Pih 03/01/2019, 3:02 PM

## 2019-03-02 DIAGNOSIS — E1149 Type 2 diabetes mellitus with other diabetic neurological complication: Secondary | ICD-10-CM

## 2019-03-02 LAB — GLUCOSE, CAPILLARY
Glucose-Capillary: 130 mg/dL — ABNORMAL HIGH (ref 70–99)
Glucose-Capillary: 142 mg/dL — ABNORMAL HIGH (ref 70–99)
Glucose-Capillary: 149 mg/dL — ABNORMAL HIGH (ref 70–99)
Glucose-Capillary: 151 mg/dL — ABNORMAL HIGH (ref 70–99)
Glucose-Capillary: 153 mg/dL — ABNORMAL HIGH (ref 70–99)
Glucose-Capillary: 184 mg/dL — ABNORMAL HIGH (ref 70–99)

## 2019-03-02 MED ORDER — LISINOPRIL 10 MG PO TABS
10.0000 mg | ORAL_TABLET | Freq: Every day | ORAL | Status: DC
  Administered 2019-03-02 – 2019-03-10 (×8): 10 mg via ORAL
  Filled 2019-03-02 (×8): qty 1
  Filled 2019-03-02: qty 4

## 2019-03-02 MED ORDER — IPRATROPIUM-ALBUTEROL 0.5-2.5 (3) MG/3ML IN SOLN
3.0000 mL | Freq: Two times a day (BID) | RESPIRATORY_TRACT | Status: DC
  Administered 2019-03-02 – 2019-03-13 (×22): 3 mL via RESPIRATORY_TRACT
  Filled 2019-03-02 (×21): qty 3

## 2019-03-02 NOTE — Progress Notes (Signed)
PROGRESS NOTE    Tyrone Cunningham  INO:[id number] DOB: 02-21-1956 DOA: 02/19/2019 PCP: System, Pcp Not In   Brief Narrative:  Tyrone Cunningham is a 63 y.o. malewith past medical history significant ofchronic CHF, diabetes, chronic pain syndrome,chronic trach. Patient presented secondary to altered mental status and found to have an acute stroke in addition to pneumonia.  Subjective: Patient was feeling better when seen this morning.  Denies any shortness of breath or pain.  He was able to communicate some with trach valve, very difficult to understand.  Was able to follow some commands.  Assessment & Plan:   Active Problems:   Hypertension   Tracheostomy dependent (HCC)   DM (diabetes mellitus), type 2 with neurological complications (HCC)   Palliative care encounter   Stroke (cerebrum) (HCC)   Chronic diastolic CHF (congestive heart failure) (HCC)   Hemiparesis of right dominant side (HCC)   Aspiration pneumonia of left lung (HCC)  Acute stroke Associated right sided hemiplegia and facial droop. 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 Resultant cortical blindness. -Neurology recommendations: DAPT for 3 months followed by aspirin alone. Signed off -PT/OT recommending SNF -Aspirin/Plavix -Lipitor 20 mg daily  Dysphagia In setting of stroke. Failed swallow study. Pulled out NG tube and does not want it reinserted. Patient amenable to PEG tube. MBS on 12/28 was favorable and patient started on a Dysphagia 1 diet. -Continued speech therapy recommendations  Confusion Waxes and wanes.  Sepsis Secondary to pneumonia. Not present on admission. Physiology resolved.  Hypernatremia Iatrogenic in setting of normal saline. Resolved. IV fluids discontinued now that patient has a diet.  Right lower lobe pneumonia Patient empirically started on Vancomycin, Ceftriaxone and Flagyl. Completed course.  Positive  blood culture 1/4 bottles positive with GPC now found to be coagulase negative staph species. Likely contamination. Vancomycin discontinued.  Essential hypertension.  Pressure remained elevated. Patient is on lisinopril and metoprolol as an outpatient. Held on admission for permissive hypertension. -Continue home metoprolol  -Restart home dose of lisinopril 10 mg daily.  Chronic diastolic heart failure Stable. -Continue Digoxin  Diabetes mellitus, type 2 Insulin dependent. Hemoglobin A1C of 8.3%. -Continue Lantus and SSI  OSA Chronic respiratory failure with hypercapnia.  Patient continued to require 8 L of oxygen, he cannot go to SNF unless he can be weaned off to 4 to 5 L. Patient is s/p tracheostomy  Lung nodule Incidental finding. 71mm in size. Non-contrast CT in 12 months if high risk. Will need outpatient follow-up.  Goals of care Palliative care consulted and after discussions with patient and family, patient will remain full code with full scope care.   Objective: Vitals:   03/02/19 0826 03/02/19 0843 03/02/19 1121 03/02/19 1156  BP:  (!) 172/88  (!) 163/72  Pulse: 93 96 (!) 105 (!) 101  Resp: 16 20 16 20   Temp:  98 F (36.7 C)  98.4 F (36.9 C)  TempSrc:  Axillary  Oral  SpO2: 97% 97% 93% 95%  Weight:      Height:        Intake/Output Summary (Last 24 hours) at 03/02/2019 1226 Last data filed at 03/02/2019 1218 Gross per 24 hour  Intake 1093.8 ml  Output 800 ml  Net 293.8 ml   Filed Weights   02/28/19 0500 03/01/19 0451 03/02/19 0500  Weight: 121.5 kg 121.2 kg 122.4 kg    Examination:  General exam: Chronically ill-appearing gentleman, talking with the help of trach  fell, difficult to understand, appears calm and comfortable  Respiratory system: Clear to auscultation. Respiratory effort normal. Cardiovascular system: S1 & S2 heard, RRR. No JVD, murmurs, rubs, gallops or clicks. Gastrointestinal system: Soft, nontender, nondistended, bowel  sounds positive. Central nervous system: Alert and oriented to self only.  Right hemiparesis Extremities: No edema, no cyanosis, pulses intact and symmetrical. Skin: No rashes, lesions or ulcers Psychiatry: Judgement and insight appear impaired.  DVT prophylaxis: Lovenox Code Status: Full Family Communication: No family at bedside. Disposition Plan: Will go to SNF-pending weaning of from higher levels of oxygen, needs to be at least 4 to 5 L before he can go.  Consultants:   Neurology  Palliative care medicine.  Procedures:   12/20: Transthoracic Echocardiogram IMPRESSIONS  1. Left ventricular ejection fraction, by visual estimation, is 55 to 60%. The left ventricle has normal function. There is no left ventricular hypertrophy. 2. Definity contrast agent was given IV to delineate the left ventricular endocardial borders. 3. Left ventricular diastolic function could not be evaluated. 4. The left ventricle has no regional wall motion abnormalities. 5. Global right ventricle was not well visualized.The right ventricular size is not well visualized. Right vetricular wall thickness was not assessed. 6. Left atrial size was not well visualized. 7. Right atrial size was not well visualized. 8. The mitral valve was not well visualized. No evidence of mitral valve regurgitation. 9. The tricuspid valve is not well visualized. Tricuspid valve regurgitation is not demonstrated. 10. The aortic valve was not well visualized. Aortic valve regurgitation is not visualized. 11. The pulmonic valve was not well visualized. Pulmonic valve regurgitation is not visualized. 12. The aortic root was not well visualized. 13. Very technically difficult study, even with use of echo contrast. Contrast shows normal EF without significant wall motion abnormalities. Limited ability to assess any other features due to body habitus and limited echo windows. 14. The interatrial septum was not well  visualized.  Antimicrobials:  Vancomycin  Cefepime  Flagyl    Data Reviewed: I have personally reviewed following labs and imaging studies  CBC: Recent Labs  Lab 02/28/19 1529 03/01/19 0457  WBC 13.5* 12.9*  HGB 14.0 13.0  HCT 42.7 40.0  MCV 93.4 93.2  PLT 208 220   Basic Metabolic Panel: Recent Labs  Lab 02/28/19 0052 03/01/19 0457  NA 146* 145  K 3.5 3.2*  CL 110 108  CO2 27 28  GLUCOSE 114* 171*  BUN 17 17  CREATININE 0.96 0.87  CALCIUM 8.2* 8.5*  MG 2.3  --    GFR: Estimated Creatinine Clearance: 106.1 mL/min (by C-G formula based on SCr of 0.87 mg/dL). Liver Function Tests: No results for input(s): AST, ALT, ALKPHOS, BILITOT, PROT, ALBUMIN in the last 168 hours. No results for input(s): LIPASE, AMYLASE in the last 168 hours. No results for input(s): AMMONIA in the last 168 hours. Coagulation Profile: No results for input(s): INR, PROTIME in the last 168 hours. Cardiac Enzymes: No results for input(s): CKTOTAL, CKMB, CKMBINDEX, TROPONINI in the last 168 hours. BNP (last 3 results) No results for input(s): PROBNP in the last 8760 hours. HbA1C: No results for input(s): HGBA1C in the last 72 hours. CBG: Recent Labs  Lab 03/01/19 1555 03/01/19 2017 03/02/19 0401 03/02/19 0829 03/02/19 1156  GLUCAP 160* 155* 153* 130* 142*   Lipid Profile: No results for input(s): CHOL, HDL, LDLCALC, TRIG, CHOLHDL, LDLDIRECT in the last 72 hours. Thyroid Function Tests: No results for input(s): TSH, T4TOTAL, FREET4, T3FREE, THYROIDAB in the  last 72 hours. Anemia Panel: No results for input(s): VITAMINB12, FOLATE, FERRITIN, TIBC, IRON, RETICCTPCT in the last 72 hours. Sepsis Labs: No results for input(s): PROCALCITON, LATICACIDVEN in the last 168 hours.  Recent Results (from the past 240 hour(s))  Culture, blood (routine x 2)     Status: None   Collection Time: 02/21/19 10:30 AM   Specimen: BLOOD RIGHT HAND  Result Value Ref Range Status   Specimen  Description BLOOD RIGHT HAND  Final   Special Requests   Final    BOTTLES DRAWN AEROBIC ONLY Blood Culture adequate volume   Culture   Final    NO GROWTH 5 DAYS Performed at [facility], [address] [place], [place] [address]    Report Status 02/26/2019 FINAL  Final  Culture, blood (routine x 2)     Status: Abnormal   Collection Time: 02/21/19 10:38 AM   Specimen: BLOOD  Result Value Ref Range Status   Specimen Description BLOOD RIGHT ANTECUBITAL  Final   Special Requests   Final    BOTTLES DRAWN AEROBIC ONLY Blood Culture adequate volume   Culture  Setup Time   Final    GRAM POSITIVE COCCI IN CLUSTERS AEROBIC BOTTLE ONLY CRITICAL RESULT CALLED TO, READ BACK BY AND VERIFIED WITH: TRubin Payor PHARMD, AT [id number] 02/22/19 BY D. VANHOOK    Culture (A)  Final    STAPHYLOCOCCUS SPECIES (COAGULASE NEGATIVE) THE SIGNIFICANCE OF ISOLATING THIS ORGANISM FROM A SINGLE SET OF BLOOD CULTURES WHEN MULTIPLE SETS ARE DRAWN IS UNCERTAIN. PLEASE NOTIFY THE MICROBIOLOGY DEPARTMENT WITHIN ONE WEEK IF SPECIATION AND SENSITIVITIES ARE REQUIRED. Performed at [facility], [address] [place], [place] [address]    Report Status 02/25/2019 FINAL  Final     Radiology Studies: DG Swallowing Func-Speech Pathology  Result Date: 02/28/2019 Objective Swallowing Evaluation: Type of Study: MBS-Modified Barium Swallow Study  Patient Details Name: Tyrone Cunningham MRN: [id number] Date of Birth: 08/12/1955 Today's Date: 02/28/2019 Time: SLP Start Time (ACUTE ONLY): 1440 -SLP Stop Time (ACUTE ONLY): 1502 SLP Time Calculation (min) (ACUTE ONLY): 22 min Past Medical History: Past Medical History: Diagnosis Date . Bronchitis  . CHF (congestive heart failure) (HCC)   grade 1 diastolic with preserved EF in 6213 . COPD (chronic obstructive pulmonary disease) (HCC)  . Diabetes mellitus without complication (HCC)  . DM type 2 with diabetic peripheral neuropathy (HCC)  . Hearing loss  . Hypercholesteremia  .  Hypertension  . Peripheral neuropathy  . Sleep apnea   Severe sleep apnea requiring tracheostomy Past Surgical History: Past Surgical History: Procedure Laterality Date . APPENDECTOMY   . CHOLECYSTECTOMY N/A 01/05/2014  Procedure: LAPAROSCOPIC CHOLECYSTECTOMY;  Surgeon: Emelia Loron, MD;  Location: [facility] ORS;  Service: General;  Laterality: N/A; . FEMUR HARDWARE REMOVAL  09/01/2003  Removal of retained hardware, two interlocking distal femoral . FEMUR SURGERY   . HAND SURGERY   . MULTIPLE EXTRACTIONS WITH ALVEOLOPLASTY N/A 05/11/2012  Procedure: MULTIPLE EXTRACTION WITH ALVEOLOPLASTY;  Surgeon: [name], DDS;  Location: [facility] OR;  Service: Oral Surgery;  Laterality: N/A; . TRACHEOSTOMY   HPI: Tyrone Cunningham is a 63 y.o. male with medical history significant of CHF, diabetes, chronic pain syndrome, was brought to ED via EMS from [facility] nursing facility for evaluation of altered mental status. Per nursing home staff, pt appears to have right sided facial drooping, and right-sided weakness. 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  No previous ST notes in  Epic.  Pt reported that he does not wear a PMV and usually finger occludes or mouths words to communicate.  MBS 02/22/19 with recs for NPO with AMN  Subjective: alert Assessment / Plan / Recommendation CHL IP CLINICAL IMPRESSIONS 02/28/2019 Clinical Impression Pt presents with much improved swallow function marked by persisting but improved oral dysphagia, with only mild anterior loss. Pharyngeal phase significantly improved with improved timeliness of swallow onset, reliable laryngeal vestibule closure with large, successive swallows of nectar (no penetration/aspiration), and only one incident of penetration with thin liquids; no aspiration. Pt is safe to begin a dysphagia 1 diet with thin liquids; PMV must be in place for all POs; crush meds.   SLP Visit Diagnosis Dysphagia, oropharyngeal phase (R13.12);Aphonia (R49.1)  Attention and concentration deficit following -- Frontal lobe and executive function deficit following -- Impact on safety and function Mild aspiration risk   CHL IP TREATMENT RECOMMENDATION 02/28/2019 Treatment Recommendations Therapy as outlined in treatment plan below   Prognosis 02/28/2019 Prognosis for Safe Diet Advancement Good Barriers to Reach Goals -- Barriers/Prognosis Comment -- CHL IP DIET RECOMMENDATION 02/28/2019 SLP Diet Recommendations Dysphagia 1 (Puree) solids;Thin liquid Liquid Administration via Cup Medication Administration Crushed with puree Compensations Minimize environmental distractions;Small sips/bites Postural Changes --   CHL IP OTHER RECOMMENDATIONS 02/28/2019 Recommended Consults -- Oral Care Recommendations Oral care BID Other Recommendations --   CHL IP FOLLOW UP RECOMMENDATIONS 02/23/2019 Follow up Recommendations Skilled Nursing facility   [facility] IP FREQUENCY AND DURATION 02/22/2019 Speech Therapy Frequency (ACUTE ONLY) min 2x/week Treatment Duration 2 weeks      CHL IP ORAL PHASE 02/28/2019 Oral Phase Impaired Oral - Pudding Teaspoon -- Oral - Pudding Cup -- Oral - Honey Teaspoon -- Oral - Honey Cup -- Oral - Nectar Teaspoon -- Oral - Nectar Cup -- Oral - Nectar Straw -- Oral - Thin Teaspoon -- Oral - Thin Cup -- Oral - Thin Straw -- Oral - Puree -- Oral - Mech Soft -- Oral - Regular -- Oral - Multi-Consistency -- Oral - Pill -- Oral Phase - Comment --  CHL IP PHARYNGEAL PHASE 02/28/2019 Pharyngeal Phase Impaired Pharyngeal- Pudding Teaspoon -- Pharyngeal -- Pharyngeal- Pudding Cup -- Pharyngeal -- Pharyngeal- Honey Teaspoon -- Pharyngeal -- Pharyngeal- Honey Cup -- Pharyngeal -- Pharyngeal- Nectar Teaspoon -- Pharyngeal -- Pharyngeal- Nectar Cup Delayed swallow initiation-pyriform sinuses;Pharyngeal residue - valleculae Pharyngeal Material does not enter airway Pharyngeal- Nectar Straw Delayed swallow initiation-vallecula;Pharyngeal residue - valleculae Pharyngeal Material does not  enter airway Pharyngeal- Thin Teaspoon -- Pharyngeal -- Pharyngeal- Thin Cup Delayed swallow initiation-pyriform sinuses Pharyngeal Material does not enter airway Pharyngeal- Thin Straw Delayed swallow initiation-pyriform sinuses;Penetration/Aspiration before swallow Pharyngeal Material enters airway, remains ABOVE vocal cords and not ejected out Pharyngeal- Puree -- Pharyngeal -- Pharyngeal- Mechanical Soft -- Pharyngeal -- Pharyngeal- Regular -- Pharyngeal -- Pharyngeal- Multi-consistency -- Pharyngeal -- Pharyngeal- Pill -- Pharyngeal -- Pharyngeal Comment --  No flowsheet data found. [name] 02/28/2019, 4:13 PM               Scheduled Meds: .  stroke: mapping our early stages of recovery book   Does not apply Once  . aspirin EC  325 mg Oral Daily  . atorvastatin  20 mg Oral q1800  . budesonide  0.5 mg Nebulization BID  . clopidogrel  75 mg Oral Daily  . digoxin  0.25 mg Intravenous Daily  . enoxaparin (LOVENOX) injection  60 mg Subcutaneous Q24H  . feeding supplement (ENSURE ENLIVE)  237 mL Oral  BID BM  . insulin aspart  0-5 Units Subcutaneous QHS  . insulin aspart  0-9 Units Subcutaneous TID WC  . insulin glargine  10 Units Subcutaneous Daily  . ipratropium-albuterol  3 mL Nebulization TID  . lisinopril  10 mg Oral Daily  . metoprolol tartrate  12.5 mg Oral Q6H  . multivitamin with minerals  1 tablet Oral Daily  . Resource ThickenUp Clear   Oral Once  . sodium chloride flush  10-40 mL Intracatheter Q12H   Continuous Infusions: . sodium chloride Stopped (02/28/19 2112)  . valproate sodium 500 mg (03/02/19 0529)     LOS: 11 days   Time spent: 40 minutes.  Arnetha CourserSumayya Mollee Neer, MD [facility] Pager [phone]  If 7PM-7AM, please contact night-coverage [email or web address] Password [email or web address] 03/02/2019, 12:26 PM   This record has been created using Conservation officer, historic buildingsDragon voice recognition software. Errors have been sought and corrected,but may not always be located. Such creation errors  do not reflect on the standard of care.

## 2019-03-03 DIAGNOSIS — I6300 Cerebral infarction due to thrombosis of unspecified precerebral artery: Secondary | ICD-10-CM

## 2019-03-03 LAB — GLUCOSE, CAPILLARY
Glucose-Capillary: 160 mg/dL — ABNORMAL HIGH (ref 70–99)
Glucose-Capillary: 125 mg/dL — ABNORMAL HIGH (ref 70–99)
Glucose-Capillary: 168 mg/dL — ABNORMAL HIGH (ref 70–99)
Glucose-Capillary: 126 mg/dL — ABNORMAL HIGH (ref 70–99)
Glucose-Capillary: 163 mg/dL — ABNORMAL HIGH (ref 70–99)

## 2019-03-03 LAB — URINE CULTURE: Culture: NO GROWTH

## 2019-03-03 MED ORDER — DIVALPROEX SODIUM 125 MG PO CSDR
500.0000 mg | DELAYED_RELEASE_CAPSULE | Freq: Three times a day (TID) | ORAL | Status: DC
  Administered 2019-03-03 – 2019-03-09 (×16): 500 mg via ORAL
  Filled 2019-03-03 (×16): qty 4

## 2019-03-03 MED ORDER — DIVALPROEX SODIUM 250 MG PO DR TAB
500.0000 mg | DELAYED_RELEASE_TABLET | Freq: Three times a day (TID) | ORAL | Status: DC
  Administered 2019-03-03 (×2): 500 mg via ORAL
  Filled 2019-03-03 (×2): qty 2

## 2019-03-03 MED ORDER — DIGOXIN 125 MCG PO TABS
0.2500 mg | ORAL_TABLET | Freq: Every day | ORAL | Status: DC
  Administered 2019-03-03 – 2019-03-07 (×5): 0.25 mg via ORAL
  Filled 2019-03-03 (×5): qty 2

## 2019-03-03 MED ORDER — DEXTROSE 50 % IV SOLN
INTRAVENOUS | Status: AC
  Filled 2019-03-03: qty 50

## 2019-03-03 NOTE — Progress Notes (Signed)
PROGRESS NOTE    [name]  XBM:[id number] DOB: 07/24/1955 DOA: 02/19/2019 PCP: System, Pcp Not In   Brief Narrative:  Tyrone Cunningham is a 63 y.o. malewith past medical history significant ofchronic CHF, diabetes, chronic pain syndrome,chronic trach. Patient presented secondary to altered mental status and found to have an acute stroke in addition to pneumonia. Trying to wean his oxygen for SNF.  Subjective: Patient was feeling better when seen this morning.  No new complaints.  Assessment & Plan:   Active Problems:   Hypertension   Tracheostomy dependent ([place])   DM (diabetes mellitus), type 2 with neurological complications ([place])   Palliative care encounter   Stroke (cerebrum) (HCC)   Chronic diastolic CHF (congestive heart failure) (HCC)   Hemiparesis of right dominant side (HCC)   Aspiration pneumonia of left lung (HCC)  Acute stroke Associated right sided hemiplegia and facial droop. 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 Resultant cortical blindness. -Neurology recommendations: DAPT for 3 months followed by aspirin alone. Signed off -PT/OT recommending SNF -Aspirin/Plavix -Lipitor 20 mg daily  Dysphagia In setting of stroke. Failed swallow study. Pulled out NG tube and does not want it reinserted. Patient amenable to PEG tube. MBS on 12/28 was favorable and patient started on a Dysphagia 1 diet. -Continued speech therapy recommendations  Confusion Waxes and wanes.  Sepsis Secondary to pneumonia. Not present on admission. Physiology resolved.  Hypernatremia Iatrogenic in setting of normal saline. Resolved. IV fluids discontinued now that patient has a diet.  Right lower lobe pneumonia Patient empirically started on Vancomycin, Ceftriaxone and Flagyl. Completed course.  Positive blood culture 1/4 bottles positive with GPC now found to be coagulase negative staph species.  Likely contamination. Vancomycin discontinued.  Essential hypertension.  Pressure remained elevated. Patient is on lisinopril and metoprolol as an outpatient. Held on admission for permissive hypertension. -Continue home metoprolol  -Continue with home dose of lisinopril which was started yesterday.  Chronic diastolic heart failure Stable. -Continue Digoxin  Diabetes mellitus, type 2 Insulin dependent. Hemoglobin A1C of 8.3%. -Continue Lantus and SSI  OSA Chronic respiratory failure with hypercapnia.  Patient continued to require 5-6 L of oxygen, he cannot go to SNF unless he can be weaned off to 4 to 5 L. Patient is s/p tracheostomy. -Continue weaning, he was saturating between 89 to 91% on 5 L when seen this morning.  Lung nodule Incidental finding. 33mm in size. Non-contrast CT in 12 months if high risk. Will need outpatient follow-up.  Goals of care Palliative care consulted and after discussions with patient and family, patient will remain full code with full scope care.   Objective: Vitals:   03/03/19 0500 03/03/19 0825 03/03/19 0828 03/03/19 1211  BP:   (!) 160/75 (!) 160/84  Pulse:  97 91 95  Resp:  18 20 20   Temp:   (!) 97.4 F (36.3 C) 98.7 F (37.1 C)  TempSrc:   Oral Axillary  SpO2:  (!) 89% 99% 94%  Weight: 120.7 kg     Height:        Intake/Output Summary (Last 24 hours) at 03/03/2019 1258 Last data filed at 03/03/2019 0800 Gross per 24 hour  Intake 370.03 ml  Output --  Net 370.03 ml   Filed Weights   03/01/19 0451 03/02/19 0500 03/03/19 0500  Weight: 121.2 kg 122.4 kg 120.7 kg    Examination:  General exam: Chronically ill-appearing gentleman, appears calm and comfortable  Respiratory  system: Clear to auscultation. Respiratory effort normal. Cardiovascular system: S1 & S2 heard, RRR. No JVD, murmurs, rubs, gallops or clicks. Gastrointestinal system: Soft, nontender, nondistended, bowel sounds positive. Central nervous system: Alert and  oriented to self only.  Right hemiparesis Extremities: No edema, no cyanosis, pulses intact and symmetrical. Skin: No rashes, lesions or ulcers Psychiatry: Judgement and insight appear impaired.  DVT prophylaxis: Lovenox Code Status: Full Family Communication: No family at bedside. Disposition Plan: Will go to SNF-pending weaning of from higher levels of oxygen, needs to be at least 4 to 5 L before he can go.  Consultants:   Neurology  Palliative care medicine.  Procedures:   12/20: Transthoracic Echocardiogram IMPRESSIONS  1. Left ventricular ejection fraction, by visual estimation, is 55 to 60%. The left ventricle has normal function. There is no left ventricular hypertrophy. 2. Definity contrast agent was given IV to delineate the left ventricular endocardial borders. 3. Left ventricular diastolic function could not be evaluated. 4. The left ventricle has no regional wall motion abnormalities. 5. Global right ventricle was not well visualized.The right ventricular size is not well visualized. Right vetricular wall thickness was not assessed. 6. Left atrial size was not well visualized. 7. Right atrial size was not well visualized. 8. The mitral valve was not well visualized. No evidence of mitral valve regurgitation. 9. The tricuspid valve is not well visualized. Tricuspid valve regurgitation is not demonstrated. 10. The aortic valve was not well visualized. Aortic valve regurgitation is not visualized. 11. The pulmonic valve was not well visualized. Pulmonic valve regurgitation is not visualized. 12. The aortic root was not well visualized. 13. Very technically difficult study, even with use of echo contrast. Contrast shows normal EF without significant wall motion abnormalities. Limited ability to assess any other features due to body habitus and limited echo windows. 14. The interatrial septum was not well  visualized.  Antimicrobials:  Vancomycin  Cefepime  Flagyl    Data Reviewed: I have personally reviewed following labs and imaging studies  CBC: Recent Labs  Lab 02/28/19 1529 03/01/19 0457  WBC 13.5* 12.9*  HGB 14.0 13.0  HCT 42.7 40.0  MCV 93.4 93.2  PLT 208 220   Basic Metabolic Panel: Recent Labs  Lab 02/28/19 0052 03/01/19 0457  NA 146* 145  K 3.5 3.2*  CL 110 108  CO2 27 28  GLUCOSE 114* 171*  BUN 17 17  CREATININE 0.96 0.87  CALCIUM 8.2* 8.5*  MG 2.3  --    GFR: Estimated Creatinine Clearance: 105.2 mL/min (by C-G formula based on SCr of 0.87 mg/dL). Liver Function Tests: No results for input(s): AST, ALT, ALKPHOS, BILITOT, PROT, ALBUMIN in the last 168 hours. No results for input(s): LIPASE, AMYLASE in the last 168 hours. No results for input(s): AMMONIA in the last 168 hours. Coagulation Profile: No results for input(s): INR, PROTIME in the last 168 hours. Cardiac Enzymes: No results for input(s): CKTOTAL, CKMB, CKMBINDEX, TROPONINI in the last 168 hours. BNP (last 3 results) No results for input(s): PROBNP in the last 8760 hours. HbA1C: No results for input(s): HGBA1C in the last 72 hours. CBG: Recent Labs  Lab 03/02/19 1956 03/02/19 2343 03/03/19 0408 03/03/19 0826 03/03/19 1209  GLUCAP 149* 184* 160* 125* 168*   Lipid Profile: No results for input(s): CHOL, HDL, LDLCALC, TRIG, CHOLHDL, LDLDIRECT in the last 72 hours. Thyroid Function Tests: No results for input(s): TSH, T4TOTAL, FREET4, T3FREE, THYROIDAB in the last 72 hours. Anemia Panel: No results for input(s): VITAMINB12,  FOLATE, FERRITIN, TIBC, IRON, RETICCTPCT in the last 72 hours. Sepsis Labs: No results for input(s): PROCALCITON, LATICACIDVEN in the last 168 hours.  Recent Results (from the past 240 hour(s))  Culture, Urine     Status: None   Collection Time: 03/02/19  8:49 AM   Specimen: Urine, Catheterized  Result Value Ref Range Status   Specimen Description URINE,  CATHETERIZED  Final   Special Requests NONE  Final   Culture   Final    NO GROWTH Performed at [facility], [address], [place], [place] [address]    Report Status 03/03/2019 FINAL  Final     Radiology Studies: No results found.  Scheduled Meds: .  stroke: mapping our early stages of recovery book   Does not apply Once  . aspirin EC  325 mg Oral Daily  . atorvastatin  20 mg Oral q1800  . budesonide  0.5 mg Nebulization BID  . clopidogrel  75 mg Oral Daily  . digoxin  0.25 mg Oral Daily  . divalproex  500 mg Oral Q8H  . enoxaparin (LOVENOX) injection  60 mg Subcutaneous Q24H  . feeding supplement (ENSURE ENLIVE)  237 mL Oral BID BM  . insulin aspart  0-5 Units Subcutaneous QHS  . insulin aspart  0-9 Units Subcutaneous TID WC  . insulin glargine  10 Units Subcutaneous Daily  . ipratropium-albuterol  3 mL Nebulization BID  . lisinopril  10 mg Oral Daily  . metoprolol tartrate  12.5 mg Oral Q6H  . multivitamin with minerals  1 tablet Oral Daily  . Resource ThickenUp Clear   Oral Once  . sodium chloride flush  10-40 mL Intracatheter Q12H   Continuous Infusions: . sodium chloride Stopped (02/28/19 2112)     LOS: 12 days   Time spent: 30 minutes.  Arnetha Courser, MD [facility] Pager [phone]  If 7PM-7AM, please contact night-coverage [email or web address] Password [facility] 03/03/2019, 12:58 PM   This record has been created using Conservation officer, historic buildings. Errors have been sought and corrected,but may not always be located. Such creation errors do not reflect on the standard of care.

## 2019-03-03 NOTE — Progress Notes (Signed)
  Speech Language Pathology Treatment: Dysphagia  Patient Details Name: Tyrone Cunningham MRN: [id number] DOB: 1955/12/20 Today's Date: 03/03/2019 Time: 1420-1430 SLP Time Calculation (min) (ACUTE ONLY): 10 min  Assessment / Plan / Recommendation Clinical Impression  Pt sluggish today - acknowledges he doesn't feel very well but can't specify why.  Tolerating PMV well with stable VS, speech is more intelligible today (70%).  He accepted sips of nectar liquid, requiring tactile/verbal cues to inhibit impulsivity.  Appears to tolerate well.  Session D/Cd due to his fatigue and malaise.  Will follow.   HPI HPI: Tyrone Cunningham is a 63 y.o. male with medical history significant of CHF, diabetes, chronic pain syndrome, was brought to ED via EMS from [facility] facility for evaluation of altered mental status. Per nursing home staff, pt appears to have right sided facial drooping, and right-sided weakness. 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  No previous ST notes in Epic.  Pt reported that he does not wear a PMV and usually finger occludes or mouths words to communicate.  MBS 02/22/19 with recs for NPO.  Repeat MBS 12/28 much improved; started on dys1, thin liquids.  Pt had functional difficulty with thins at mealtime, so liquids were thickened to nectar and he appeared to tolerate much better.       SLP Plan  Continue with current plan of care       Recommendations  Diet recommendations: Dysphagia 1 (puree);Nectar-thick liquid Liquids provided via: Straw;Cup Medication Administration: Crushed with puree Supervision: Full supervision/cueing for compensatory strategies Compensations: Minimize environmental distractions;Small sips/bites(use PMV)                Oral Care Recommendations: Oral care BID Follow up Recommendations: Skilled Nursing facility SLP Visit Diagnosis: Dysphagia, oropharyngeal phase (R13.12) Plan: Continue with current plan of  care       GO                Tyrone Cunningham 03/03/2019, 2:49 PM  Tyrone Cunningham Tyrone Cunningham, Tyrone Cunningham Office number [phone] Pager [phone]

## 2019-03-03 NOTE — Progress Notes (Signed)
Physical Therapy Treatment Patient Details Name: Tyrone Cunningham MRN: [id number] DOB: Sep 11, 1955 Today's Date: 03/03/2019    History of Present Illness Tyrone Cunningham is a 63 y.o. male with PMHx: chronic CHF, diabetes, chronic pain syndrome, chronic trach, was brought to ED via EMS from [facility] for AMS with R sided facial drooping, R-sided weakness. Pt is usually bedbound but can walk on occasion; Pt is trach dependent. 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 Incidental lung nodule found.    PT Comments    Pt performed supine <> sit, rolling and positioning.  He continues to require total assistance to move to edge of bed and to roll in supine.  Once sitting edge of bed utilized tray table to prop R side of his body and support RUE.  Focused on maintaining midline and righting.  Pt with LOB in multiple planes and required mod-max assistance to maintain sitting balance.  He continues to be appropriate for return back to snf based on presentation.  Nurse present during session to suction patient and monitor vitals.     Follow Up Recommendations  SNF     Equipment Recommendations  None recommended by PT    Recommendations for Other Services       Precautions / Restrictions Precautions Precautions: Fall;Other (comment) Precaution Comments: trach dependent, PMV during PT Restrictions Weight Bearing Restrictions: No Other Position/Activity Restrictions: L posey mitt    Mobility  Bed Mobility Overal bed mobility: Needs Assistance Bed Mobility: Rolling;Sidelying to Sit;Sit to Sidelying Rolling: Total assist;+2 for physical assistance Sidelying to sit: Total assist;+2 for physical assistance     Sit to sidelying: Total assist;+2 for physical assistance General bed mobility comments: Pt continues to require total assistance to move to edge of bed.  He is able to move L side but due to poor trunk control he continues to require total assistance  for safety.  He sat edge of bed x 10 min with emphasis on head and trunk control and maintaining midline.  RUE propped on tray table.  Transfers                 General transfer comment: deferred, pt will need lift for safety and due to poor trunk control chair is not a good idea   Pt positioned in chair position in bed with pillows for propping.  Ambulation/Gait                 Stairs             Wheelchair Mobility    Modified Rankin (Stroke Patients Only) Modified Rankin (Stroke Patients Only) Pre-Morbid Rankin Score: Moderately severe disability Modified Rankin: Severe disability     Balance Overall balance assessment: Needs assistance   Sitting balance-Leahy Scale: Poor       Standing balance-Leahy Scale: Zero Standing balance comment: unable                            Cognition Arousal/Alertness: Awake/alert Behavior During Therapy: Flat affect Overall Cognitive Status: No family/caregiver present to determine baseline cognitive functioning                                 General Comments: He was able to follow commands for functional mobility during session.  With posey mitt removed he pull off pmv and pulled off his condom  cath despite cues to stop      Exercises Other Exercises Other Exercises: Performed cervical flexion and extension.  1x10 reps.  Attempted R and L rotation but unable to turn head to the L as he can only come to neutral.    General Comments        Pertinent Vitals/Pain Pain Assessment: Faces Faces Pain Scale: Hurts little more Pain Location: stomach Pain Descriptors / Indicators: Grimacing Pain Intervention(s): Repositioned    Home Living                      Prior Function            PT Goals (current goals can now be found in the care plan section) Acute Rehab PT Goals Patient Stated Goal: to get stronger on R side Potential to Achieve Goals: Fair Progress towards PT  goals: Progressing toward goals    Frequency    Min 2X/week      PT Plan Current plan remains appropriate    Co-evaluation              AM-PAC PT "6 Clicks" Mobility   Outcome Measure  Help needed turning from your back to your side while in a flat bed without using bedrails?: Total Help needed moving from lying on your back to sitting on the side of a flat bed without using bedrails?: Total Help needed moving to and from a bed to a chair (including a wheelchair)?: Total Help needed standing up from a chair using your arms (e.g., wheelchair or bedside chair)?: Total Help needed to walk in hospital room?: Total Help needed climbing 3-5 steps with a railing? : Total 6 Click Score: 6    End of Session Equipment Utilized During Treatment: Oxygen Activity Tolerance: Patient limited by fatigue;Patient limited by lethargy Patient left: in bed;with call bell/phone within reach;with bed alarm set Nurse Communication: Mobility status(nurse present during session to assist and suction patient as needed.) PT Visit Diagnosis: Hemiplegia and hemiparesis;Muscle weakness (generalized) (M62.81);Other abnormalities of gait and mobility (R26.89) Hemiplegia - Right/Left: Right Hemiplegia - dominant/non-dominant: Non-dominant     Time: 3335-4562 PT Time Calculation (min) (ACUTE ONLY): 38 min  Charges:  $Therapeutic Activity: 38-52 mins                     Bonney Leitz , PTA Acute Rehabilitation Services Pager [phone] Office [phone]     Fatma Rutten Artis Delay 03/03/2019, 4:32 PM

## 2019-03-03 NOTE — Progress Notes (Signed)
Palliative follow up.  Received call from brother Richardson Landry wanting an update about how Tyrone Cunningham was doing.  Answered questions to the best of my ability.  He then wanted to talked about the process of SNF placement.   Afterward he requested I allow him to facetime with his brother.  [name] was feeling poorly and the RN was waiting to do trach care - but he was happy to see his brother, nephew and sister in law.    Florentina Jenny, PA-C Palliative Medicine Office:  [phone]  15 min.

## 2019-03-03 NOTE — Progress Notes (Signed)
Chart reviewed.  Please have Palliative Care follow at SNF.  In discharge summary under  instructions write "Palliative Care to follow at SNF".  PMT will continue to chart check while Mr. Ingham is inpatient in case he declines.  Please do not hesitate to call us back if we can be of assistance.  Florentina Jenny, PA-C Palliative Medicine Office:  [phone]  No charge note.

## 2019-03-04 ENCOUNTER — Inpatient Hospital Stay (HOSPITAL_COMMUNITY): Payer: Medicare Other

## 2019-03-04 ENCOUNTER — Other Ambulatory Visit: Payer: Self-pay

## 2019-03-04 DIAGNOSIS — J90 Pleural effusion, not elsewhere classified: Secondary | ICD-10-CM

## 2019-03-04 LAB — BASIC METABOLIC PANEL
Anion gap: 7 (ref 5–15)
BUN: 9 mg/dL (ref 8–23)
CO2: 35 mmol/L — ABNORMAL HIGH (ref 22–32)
Calcium: 9.2 mg/dL (ref 8.9–10.3)
Chloride: 101 mmol/L (ref 98–111)
Creatinine, Ser: 0.89 mg/dL (ref 0.61–1.24)
GFR calc Af Amer: >60 mL/min (ref >60)
GFR calc non Af Amer: >60 mL/min (ref >60)
Glucose, Bld: 159 mg/dL — ABNORMAL HIGH (ref 70–99)
Potassium: 3.7 mmol/L (ref 3.5–5.1)
Sodium: 143 mmol/L (ref 135–145)

## 2019-03-04 LAB — GLUCOSE, CAPILLARY
Glucose-Capillary: 151 mg/dL — ABNORMAL HIGH (ref 70–99)
Glucose-Capillary: 152 mg/dL — ABNORMAL HIGH (ref 70–99)
Glucose-Capillary: 153 mg/dL — ABNORMAL HIGH (ref 70–99)
Glucose-Capillary: 169 mg/dL — ABNORMAL HIGH (ref 70–99)
Glucose-Capillary: 174 mg/dL — ABNORMAL HIGH (ref 70–99)
Glucose-Capillary: 217 mg/dL — ABNORMAL HIGH (ref 70–99)
Glucose-Capillary: 227 mg/dL — ABNORMAL HIGH (ref 70–99)

## 2019-03-04 LAB — CBC
HCT: 42.5 % (ref 39.0–52.0)
Hemoglobin: 13.6 g/dL (ref 13.0–17.0)
MCH: 30.4 pg (ref 26.0–34.0)
MCHC: 32 g/dL (ref 30.0–36.0)
MCV: 95.1 fL (ref 80.0–100.0)
Platelets: 200 10*3/uL (ref 150–400)
RBC: 4.47 MIL/uL (ref 4.22–5.81)
RDW: 13.4 % (ref 11.5–15.5)
WBC: 13.1 10*3/uL — ABNORMAL HIGH (ref 4.0–10.5)
nRBC: 0 % (ref 0.0–0.2)

## 2019-03-04 MED ORDER — METOPROLOL TARTRATE 5 MG/5ML IV SOLN
5.0000 mg | Freq: Once | INTRAVENOUS | Status: AC
  Administered 2019-03-04: 5 mg via INTRAVENOUS
  Filled 2019-03-04: qty 5

## 2019-03-04 NOTE — Progress Notes (Signed)
PROGRESS NOTE    Tyrone Cunningham  DVV:[id number] DOB: [birth year] DOA: 02/19/2019 PCP: System, Pcp Not In   Brief Narrative: Tyrone Cunningham is a 64 y.o. malewith past medical history significant ofchronic CHF, diabetes, chronic pain syndrome,chronic trach. Patient presented secondary to altered mental status and found to have an acute stroke in addition to pneumonia.   Assessment & Plan:   Active Problems:   Hypertension   Tracheostomy dependent ([place])   DM (diabetes mellitus), type 2 with neurological complications ([place])   Palliative care encounter   Stroke (cerebrum) (HCC)   Chronic diastolic CHF (congestive heart failure) (HCC)   Hemiparesis of right dominant side (HCC)   Aspiration pneumonia of left lung (HCC)   Acute stroke Associated right sided hemiplegia and facial droop. 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 Resultant cortical blindness. -Neurology recommendations: DAPT for 3 months followed by aspirin alone. Signed off -PT/OT recommending SNF -Aspirin/Plavix -Lipitor 20 mg daily  Dysphagia In setting of stroke. Failed swallow study. Pulled out NG tube and does not want it reinserted. Patient amenable to PEG tube. MBS on 12/28 was favorable and patient started on a Dysphagia 1 diet. -Continued speech therapy recommendations  OSA Acute on chronic respiratory failure with hypoxia and hypercapnia Patient is s/p tracheostomy. Recently treated for RLL pneumonia and completed course. Worsening hypoxia. Chest x-ray obtained today and significant for moderate sized effusion. -Continue oxygen, wean as able -Thoracentesis of right pleural space; obtain gram stain, cytology, culture, cell count  Confusion Waxes and wanes.  Sepsis Secondary to pneumonia. Not present on admission. Physiology resolved.  Abdominal pain Unknown etiology. Associated distension. Abdominal x-ray  unremarkable.  Hypernatremia Iatrogenic in setting of normal saline. Resolved. IV fluids discontinued now that patient has a diet -Recheck BMP  Right lower lobe pneumonia Patient empirically started on Vancomycin, Ceftriaxone and Flagyl. Completed course.  Positive blood culture 1/4 bottles positive with GPC now found to be coagulase negative staph species. Likely contamination. Vancomycin discontinued.  Essential hypertension Patient is on lisinopril and metoprolol as an outpatient. Held on admission for permissive hypertension. -Continue home metoprolol  Chronic diastolic heart failure Stable. -Continue Digoxin  Diabetes mellitus, type 2 Insulin dependent. Hemoglobin A1C of 8.3%. -Continue Lantus and SSI  Lung nodule Incidental finding. 78mm in size. Non-contrast CT in 12 months if high risk. Will need outpatient follow-up.  Goals of care Palliative care consulted and after discussions with patient and family, patient will remain full code with full scope care.   DVT prophylaxis: Lovenox Code Status:   Code Status: Full Code Family Communication: None Disposition Plan: Discharge pending goals of care discussions   Consultants:   Neurology  Palliative care medicine  Procedures:   12/20: Transthoracic Echocardiogram IMPRESSIONS    1. Left ventricular ejection fraction, by visual estimation, is 55 to 60%. The left ventricle has normal function. There is no left ventricular hypertrophy.  2. Definity contrast agent was given IV to delineate the left ventricular endocardial borders.  3. Left ventricular diastolic function could not be evaluated.  4. The left ventricle has no regional wall motion abnormalities.  5. Global right ventricle was not well visualized.The right ventricular size is not well visualized. Right vetricular wall thickness was not assessed.  6. Left atrial size was not well visualized.  7. Right atrial size was not well visualized.  8. The  mitral valve was not well visualized. No evidence of mitral valve regurgitation.  9.  The tricuspid valve is not well visualized. Tricuspid valve regurgitation is not demonstrated. 10. The aortic valve was not well visualized. Aortic valve regurgitation is not visualized. 11. The pulmonic valve was not well visualized. Pulmonic valve regurgitation is not visualized. 12. The aortic root was not well visualized. 13. Very technically difficult study, even with use of echo contrast. Contrast shows normal EF without significant wall motion abnormalities. Limited ability to assess any other features due to body habitus and limited echo windows. 14. The interatrial septum was not well visualized.  Antimicrobials:  Vancomycin  Cefepime  Flagyl    Subjective: No issues overnight  Objective: Vitals:   03/04/19 0135 03/04/19 0429 03/04/19 0430 03/04/19 0432  BP:   (!) 159/78   Pulse: 90 89 94   Resp: 18 20 20    Temp:   98.9 F (37.2 C)   TempSrc:   Oral   SpO2: 92% 90% 95%   Weight:    122.9 kg  Height:        Intake/Output Summary (Last 24 hours) at 03/04/2019 0734 Last data filed at 03/04/2019 0409 Gross per 24 hour  Intake 321 ml  Output 1425 ml  Net -1104 ml   Filed Weights   03/02/19 0500 03/03/19 0500 03/04/19 0432  Weight: 122.4 kg 120.7 kg 122.9 kg    Examination:  General exam: Appears calm and comfortable Respiratory system: Diminished. Respiratory effort normal. Cardiovascular system: S1 & S2 heard, RRR. No murmurs, rubs, gallops or clicks. Gastrointestinal system: Abdomen is nondistended, soft and nontender. No organomegaly or masses felt. Normal bowel sounds heard. Central nervous system: Alert. No focal neurological deficits. Extremities: Mild edema. No calf tenderness Skin: No cyanosis. No rashes Psychiatry: Judgement and insight appear impaired. Mood & affect appropriate.    Data Reviewed: I have personally reviewed following labs and imaging  studies  CBC: Recent Labs  Lab 02/28/19 1529 03/01/19 0457  WBC 13.5* 12.9*  HGB 14.0 13.0  HCT 42.7 40.0  MCV 93.4 93.2  PLT 208 220   Basic Metabolic Panel: Recent Labs  Lab 02/28/19 0052 03/01/19 0457  NA 146* 145  K 3.5 3.2*  CL 110 108  CO2 27 28  GLUCOSE 114* 171*  BUN 17 17  CREATININE 0.96 0.87  CALCIUM 8.2* 8.5*  MG 2.3  --    GFR: Estimated Creatinine Clearance: 106.3 mL/min (by C-G formula based on SCr of 0.87 mg/dL). Liver Function Tests: No results for input(s): AST, ALT, ALKPHOS, BILITOT, PROT, ALBUMIN in the last 168 hours. No results for input(s): LIPASE, AMYLASE in the last 168 hours. No results for input(s): AMMONIA in the last 168 hours. Coagulation Profile: No results for input(s): INR, PROTIME in the last 168 hours. Cardiac Enzymes: No results for input(s): CKTOTAL, CKMB, CKMBINDEX, TROPONINI in the last 168 hours. BNP (last 3 results) No results for input(s): PROBNP in the last 8760 hours. HbA1C: No results for input(s): HGBA1C in the last 72 hours. CBG: Recent Labs  Lab 03/03/19 1209 03/03/19 1627 03/03/19 2006 03/04/19 0025 03/04/19 0315  GLUCAP 168* 126* 163* 169* 152*   Lipid Profile: No results for input(s): CHOL, HDL, LDLCALC, TRIG, CHOLHDL, LDLDIRECT in the last 72 hours. Thyroid Function Tests: No results for input(s): TSH, T4TOTAL, FREET4, T3FREE, THYROIDAB in the last 72 hours. Anemia Panel: No results for input(s): VITAMINB12, FOLATE, FERRITIN, TIBC, IRON, RETICCTPCT in the last 72 hours. Sepsis Labs: No results for input(s): PROCALCITON, LATICACIDVEN in the last 168 hours.  Recent Results (from the  past 240 hour(s))  Culture, Urine     Status: None   Collection Time: 03/02/19  8:49 AM   Specimen: Urine, Catheterized  Result Value Ref Range Status   Specimen Description URINE, CATHETERIZED  Final   Special Requests NONE  Final   Culture   Final    NO GROWTH Performed at [facility], [address]  [place], [place] [address]    Report Status 03/03/2019 FINAL  Final         Radiology Studies: No results found.      Scheduled Meds: .  stroke: mapping our early stages of recovery book   Does not apply Once  . aspirin EC  325 mg Oral Daily  . atorvastatin  20 mg Oral q1800  . budesonide  0.5 mg Nebulization BID  . clopidogrel  75 mg Oral Daily  . digoxin  0.25 mg Oral Daily  . divalproex  500 mg Oral Q8H  . enoxaparin (LOVENOX) injection  60 mg Subcutaneous Q24H  . feeding supplement (ENSURE ENLIVE)  237 mL Oral BID BM  . insulin aspart  0-5 Units Subcutaneous QHS  . insulin aspart  0-9 Units Subcutaneous TID WC  . insulin glargine  10 Units Subcutaneous Daily  . ipratropium-albuterol  3 mL Nebulization BID  . lisinopril  10 mg Oral Daily  . metoprolol tartrate  12.5 mg Oral Q6H  . multivitamin with minerals  1 tablet Oral Daily  . Resource ThickenUp Clear   Oral Once  . sodium chloride flush  10-40 mL Intracatheter Q12H   Continuous Infusions: . sodium chloride Stopped (02/28/19 2112)     LOS: 13 days     Jacquelin Hawking, MD [facility] 03/04/2019, 7:34 AM  If 7PM-7AM, please contact night-coverage [email or web address]

## 2019-03-04 NOTE — Progress Notes (Signed)
Pt HR has been sustaining in the 130s.Currently in the upper 140s. Scheduled metoprolol has been given, as well as PRN Haldol for pt increased agitation. No PRN meds to give. MD Caleb Popp has been notified. RN waiting on orders. Nurse will continue to monitor.

## 2019-03-05 ENCOUNTER — Inpatient Hospital Stay (HOSPITAL_COMMUNITY): Payer: Medicare Other

## 2019-03-05 ENCOUNTER — Other Ambulatory Visit (HOSPITAL_COMMUNITY): Payer: Medicare Other

## 2019-03-05 LAB — CBC
HCT: 43.4 % (ref 39.0–52.0)
Hemoglobin: 13.8 g/dL (ref 13.0–17.0)
MCH: 29.7 pg (ref 26.0–34.0)
MCHC: 31.8 g/dL (ref 30.0–36.0)
MCV: 93.3 fL (ref 80.0–100.0)
Platelets: 198 10*3/uL (ref 150–400)
RBC: 4.65 MIL/uL (ref 4.22–5.81)
RDW: 13.5 % (ref 11.5–15.5)
WBC: 12.8 10*3/uL — ABNORMAL HIGH (ref 4.0–10.5)
nRBC: 0 % (ref 0.0–0.2)

## 2019-03-05 LAB — GLUCOSE, CAPILLARY
Glucose-Capillary: 127 mg/dL — ABNORMAL HIGH (ref 70–99)
Glucose-Capillary: 130 mg/dL — ABNORMAL HIGH (ref 70–99)
Glucose-Capillary: 134 mg/dL — ABNORMAL HIGH (ref 70–99)
Glucose-Capillary: 137 mg/dL — ABNORMAL HIGH (ref 70–99)
Glucose-Capillary: 148 mg/dL — ABNORMAL HIGH (ref 70–99)
Glucose-Capillary: 152 mg/dL — ABNORMAL HIGH (ref 70–99)

## 2019-03-05 LAB — CULTURE, RESPIRATORY W GRAM STAIN

## 2019-03-05 LAB — COMPREHENSIVE METABOLIC PANEL
ALT: 19 U/L (ref 0–44)
AST: 22 U/L (ref 15–41)
Albumin: 1.8 g/dL — ABNORMAL LOW (ref 3.5–5.0)
Alkaline Phosphatase: 63 U/L (ref 38–126)
Anion gap: 5 (ref 5–15)
BUN: 13 mg/dL (ref 8–23)
CO2: 34 mmol/L — ABNORMAL HIGH (ref 22–32)
Calcium: 8.7 mg/dL — ABNORMAL LOW (ref 8.9–10.3)
Chloride: 103 mmol/L (ref 98–111)
Creatinine, Ser: 0.86 mg/dL (ref 0.61–1.24)
GFR calc Af Amer: >60 mL/min (ref >60)
GFR calc non Af Amer: >60 mL/min (ref >60)
Glucose, Bld: 151 mg/dL — ABNORMAL HIGH (ref 70–99)
Potassium: 4 mmol/L (ref 3.5–5.1)
Sodium: 142 mmol/L (ref 135–145)
Total Bilirubin: 0.4 mg/dL (ref 0.3–1.2)
Total Protein: 5.8 g/dL — ABNORMAL LOW (ref 6.5–8.1)

## 2019-03-05 LAB — PROCALCITONIN: Procalcitonin: 0.22 ng/mL

## 2019-03-05 MED ORDER — FUROSEMIDE 10 MG/ML IJ SOLN
40.0000 mg | Freq: Two times a day (BID) | INTRAMUSCULAR | Status: AC
  Administered 2019-03-05 – 2019-03-06 (×2): 40 mg via INTRAVENOUS
  Filled 2019-03-05 (×2): qty 4

## 2019-03-05 MED ORDER — LIDOCAINE HCL (PF) 1 % IJ SOLN
INTRAMUSCULAR | Status: AC
  Filled 2019-03-05: qty 30

## 2019-03-05 NOTE — Progress Notes (Addendum)
Copious secretions from trach after medication administration. RN concerned for aspiration. On-call provider notified, pt now NPO. Will hold PO meds.

## 2019-03-05 NOTE — Progress Notes (Signed)
PROGRESS NOTE    Tyrone Cunningham  BJS:[id number] DOB: 05/23/1955 DOA: 02/19/2019 PCP: System, Pcp Not In   Brief Narrative: Tyrone Cunningham is a 65 y.o. malewith past medical history significant ofchronic CHF, diabetes, chronic pain syndrome,chronic trach. Patient presented secondary to altered mental status and found to have an acute stroke in addition to pneumonia.   Assessment & Plan:   Active Problems:   Hypertension   Tracheostomy dependent ([place])   DM (diabetes mellitus), type 2 with neurological complications ([place])   Palliative care encounter   Stroke (cerebrum) (HCC)   Chronic diastolic CHF (congestive heart failure) (HCC)   Hemiparesis of right dominant side (HCC)   Aspiration pneumonia of left lung (HCC)   Acute stroke Associated right sided hemiplegia and facial droop. 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 Resultant cortical blindness. -Neurology recommendations: DAPT for 3 months followed by aspirin alone. Signed off -PT/OT recommending SNF -Aspirin/Plavix -Lipitor 20 mg daily  Dysphagia In setting of stroke. Failed swallow study. Pulled out NG tube and does not want it reinserted. Patient amenable to PEG tube. MBS on 12/28 was favorable and patient started on a Dysphagia 1 diet. -Continued speech therapy recommendations  OSA Acute on chronic respiratory failure with hypoxia and hypercapnia Patient is s/p tracheostomy. Recently treated for RLL pneumonia and completed course. Worsening hypoxia. Chest x-ray obtained suggested moderately sized effusion. [place] thora ordered but with evidence of low amount of fluid. Possibly patient could have evidence of recurrent pneumonia. Oxygen requirement continues to increase. -Lasix 40 mg IV x2; chest x-ray in AM -Procalcitonin and if elevated, will start antibiotics -Continue oxygen, wean as able  Confusion Waxes and wanes.  Sepsis Secondary to  pneumonia. Not present on admission. Physiology resolved.  Abdominal pain Unknown etiology. Associated distension. Abdominal x-ray unremarkable.  Hypernatremia Iatrogenic in setting of normal saline. Resolved. IV fluids discontinued now that patient has a diet. Stable.  Right lower lobe pneumonia Patient empirically started on Vancomycin, Ceftriaxone and Flagyl. Completed course.  Positive blood culture 1/4 bottles positive with GPC now found to be coagulase negative staph species. Likely contamination. Vancomycin discontinued.  Essential hypertension Patient is on lisinopril and metoprolol as an outpatient. Held on admission for permissive hypertension. -Continue home metoprolol  Chronic diastolic heart failure Stable. -Continue Digoxin  Diabetes mellitus, type 2 Insulin dependent. Hemoglobin A1C of 8.3%. -Continue Lantus and SSI  Lung nodule Incidental finding. 42mm in size. Non-contrast CT in 12 months if high risk. Will need outpatient follow-up.  Goals of care Palliative care consulted and after discussions with patient and family, patient will remain full code with full scope care.   DVT prophylaxis: Lovenox Code Status:   Code Status: Full Code Family Communication: None Disposition Plan: Discharge pending goals of care discussions   Consultants:   Neurology  Palliative care medicine  Procedures:   12/20: Transthoracic Echocardiogram IMPRESSIONS    1. Left ventricular ejection fraction, by visual estimation, is 55 to 60%. The left ventricle has normal function. There is no left ventricular hypertrophy.  2. Definity contrast agent was given IV to delineate the left ventricular endocardial borders.  3. Left ventricular diastolic function could not be evaluated.  4. The left ventricle has no regional wall motion abnormalities.  5. Global right ventricle was not well visualized.The right ventricular size is not well visualized. Right vetricular wall  thickness was not assessed.  6. Left atrial size was not well visualized.  7.  Right atrial size was not well visualized.  8. The mitral valve was not well visualized. No evidence of mitral valve regurgitation.  9. The tricuspid valve is not well visualized. Tricuspid valve regurgitation is not demonstrated. 10. The aortic valve was not well visualized. Aortic valve regurgitation is not visualized. 11. The pulmonic valve was not well visualized. Pulmonic valve regurgitation is not visualized. 12. The aortic root was not well visualized. 13. Very technically difficult study, even with use of echo contrast. Contrast shows normal EF without significant wall motion abnormalities. Limited ability to assess any other features due to body habitus and limited echo windows. 14. The interatrial septum was not well visualized.  Antimicrobials:  Vancomycin  Cefepime  Flagyl    Subjective: No issues noted  Objective: Vitals:   03/05/19 0741 03/05/19 0802 03/05/19 1109 03/05/19 1145  BP: (!) 159/85   (!) 164/99  Pulse: 100  95 94  Resp: 17  (!) 22 18  Temp: 99.3 F (37.4 C)   (!) 96.6 F (35.9 C)  TempSrc: Axillary   Axillary  SpO2: 95% 93% 93% 95%  Weight:      Height:       No intake or output data in the 24 hours ending 03/05/19 1258 Filed Weights   03/03/19 0500 03/04/19 0432 03/05/19 0320  Weight: 120.7 kg 122.9 kg 120.5 kg    Examination:  General exam: Appears calm and comfortable Respiratory system: Diminished bilaterally. Respiratory effort normal. Cardiovascular system: S1 & S2 heard, RRR. No murmurs, rubs, gallops or clicks. Gastrointestinal system: Abdomen is nondistended, soft and nontender. No organomegaly or masses felt. Normal bowel sounds heard. Central nervous system: Alert. Right sided hemiplegia. Extremities: No edema. No calf tenderness Skin: No cyanosis. No rashes Psychiatry: Mood & affect appropriate. Does not seem agitated today   Data Reviewed: I have  personally reviewed following labs and imaging studies  CBC: Recent Labs  Lab 02/28/19 1529 03/01/19 0457 03/04/19 0800 03/05/19 1124  WBC 13.5* 12.9* 13.1* 12.8*  HGB 14.0 13.0 13.6 13.8  HCT 42.7 40.0 42.5 43.4  MCV 93.4 93.2 95.1 93.3  PLT 208 220 200 198   Basic Metabolic Panel: Recent Labs  Lab 02/28/19 0052 03/01/19 0457 03/04/19 0842 03/05/19 1124  NA 146* 145 143 142  K 3.5 3.2* 3.7 4.0  CL 110 108 101 103  CO2 27 28 35* 34*  GLUCOSE 114* 171* 159* 151*  BUN 17 17 9 13   CREATININE 0.96 0.87 0.89 0.86  CALCIUM 8.2* 8.5* 9.2 8.7*  MG 2.3  --   --   --    GFR: Estimated Creatinine Clearance: 106.5 mL/min (by C-G formula based on SCr of 0.86 mg/dL). Liver Function Tests: Recent Labs  Lab 03/05/19 1124  AST 22  ALT 19  ALKPHOS 63  BILITOT 0.4  PROT 5.8*  ALBUMIN 1.8*   No results for input(s): LIPASE, AMYLASE in the last 168 hours. No results for input(s): AMMONIA in the last 168 hours. Coagulation Profile: No results for input(s): INR, PROTIME in the last 168 hours. Cardiac Enzymes: No results for input(s): CKTOTAL, CKMB, CKMBINDEX, TROPONINI in the last 168 hours. BNP (last 3 results) No results for input(s): PROBNP in the last 8760 hours. HbA1C: No results for input(s): HGBA1C in the last 72 hours. CBG: Recent Labs  Lab 03/04/19 2000 03/04/19 2343 03/05/19 0326 03/05/19 0744 03/05/19 1147  GLUCAP 217* 174* 137* 134* 130*   Lipid Profile: No results for input(s): CHOL, HDL, LDLCALC, TRIG, CHOLHDL,  LDLDIRECT in the last 72 hours. Thyroid Function Tests: No results for input(s): TSH, T4TOTAL, FREET4, T3FREE, THYROIDAB in the last 72 hours. Anemia Panel: No results for input(s): VITAMINB12, FOLATE, FERRITIN, TIBC, IRON, RETICCTPCT in the last 72 hours. Sepsis Labs: No results for input(s): PROCALCITON, LATICACIDVEN in the last 168 hours.  Recent Results (from the past 240 hour(s))  Culture, Urine     Status: None   Collection Time:  03/02/19  8:49 AM   Specimen: Urine, Catheterized  Result Value Ref Range Status   Specimen Description URINE, CATHETERIZED  Final   Special Requests NONE  Final   Culture   Final    NO GROWTH Performed at [facility], [address], [place], [place] [address]    Report Status 03/03/2019 FINAL  Final  Culture, respiratory     Status: None (Preliminary result)   Collection Time: 03/03/19  7:18 PM   Specimen: Tracheal Aspirate  Result Value Ref Range Status   Specimen Description TRACHEAL ASPIRATE  Final   Special Requests NONE  Final   Gram Stain   Final    FEW SQUAMOUS EPITHELIAL CELLS PRESENT ABUNDANT WBC PRESENT, PREDOMINANTLY PMN ABUNDANT GRAM POSITIVE RODS RARE GRAM POSITIVE COCCI    Culture   Final    ABUNDANT STAPHYLOCOCCUS EPIDERMIDIS CULTURE REINCUBATED FOR BETTER GROWTH Performed at [facility], [address], [place], [place] [address]    Report Status PENDING  Incomplete   Organism ID, Bacteria STAPHYLOCOCCUS EPIDERMIDIS  Final      Susceptibility   Staphylococcus epidermidis - MIC*    CIPROFLOXACIN 1 SENSITIVE Sensitive     ERYTHROMYCIN >=8 RESISTANT Resistant     GENTAMICIN <=0.5 SENSITIVE Sensitive     OXACILLIN >=4 RESISTANT Resistant     TETRACYCLINE >=16 RESISTANT Resistant     VANCOMYCIN 1 SENSITIVE Sensitive     TRIMETH/SULFA 160 RESISTANT Resistant     CLINDAMYCIN <=0.25 SENSITIVE Sensitive     RIFAMPIN <=0.5 SENSITIVE Sensitive     Inducible Clindamycin NEGATIVE Sensitive     * ABUNDANT STAPHYLOCOCCUS EPIDERMIDIS         Radiology Studies: [place] CHEST (PLEURAL EFFUSION)  Result Date: 03/05/2019 CLINICAL DATA:  Acute on chronic respiratory failure. Evaluate for left-sided pleural effusion and perform ultrasound-guided thoracentesis as indicated. EXAM: CHEST ULTRASOUND COMPARISON:  Chest radiograph-03/05/2019 FINDINGS: Sonographic evaluation of the left chest is negative for any significant pleural fluid. As such, ultrasound-guided  thoracentesis was not attempted. IMPRESSION: No significant left-sided pleural effusion. Ultrasound-guided thoracentesis was not attempted. Electronically Signed   By: Simonne Come M.D.   On: 03/05/2019 10:01   DG CHEST PORT 1 VIEW  Result Date: 03/05/2019 CLINICAL DATA:  Acute on chronic respiratory failure. EXAM: PORTABLE CHEST 1 VIEW COMPARISON:  March 04, 2019. FINDINGS: Stable cardiomediastinal silhouette. Tracheostomy tube is unchanged in position. No pneumothorax is noted. Left lung is clear. Mild right pleural effusion is noted with associated atelectasis or infiltrate. Bony thorax is unremarkable. IMPRESSION: Stable mild right pleural effusion with associated atelectasis or infiltrate. Stable tracheostomy tube. No pneumothorax is noted. Electronically Signed   By: Lupita Raider M.D.   On: 03/05/2019 09:27   DG CHEST PORT 1 VIEW  Result Date: 03/04/2019 CLINICAL DATA:  Reason for exam: acute on chronic respiratory failure Patient non verbal throughout exam. Showed difficulty holding still for images. Hx of chf, bronchitis, copd, diabetes, htn. Hx alveoloplasty 2014, tracheostomy. Quit smoking 2001. EXAM: PORTABLE CHEST 1 VIEW COMPARISON:  Chest radiograph  02/21/2019 FINDINGS: Stable cardiomediastinal contours. Tracheostomy in place. Moderate size right layering pleural effusion. Persistent right basilar consolidative opacity could reflect atelectasis or infection. Left lung is clear. No pneumothorax. IMPRESSION: Small to moderate size right pleural effusion with persistent right basilar opacity possibly atelectasis or infection. Electronically Signed   By: Emmaline Kluver M.D.   On: 03/04/2019 11:40        Scheduled Meds: .  stroke: mapping our early stages of recovery book   Does not apply Once  . aspirin EC  325 mg Oral Daily  . atorvastatin  20 mg Oral q1800  . budesonide  0.5 mg Nebulization BID  . clopidogrel  75 mg Oral Daily  . digoxin  0.25 mg Oral Daily  . divalproex  500 mg  Oral Q8H  . enoxaparin (LOVENOX) injection  60 mg Subcutaneous Q24H  . feeding supplement (ENSURE ENLIVE)  237 mL Oral BID BM  . insulin aspart  0-5 Units Subcutaneous QHS  . insulin aspart  0-9 Units Subcutaneous TID WC  . insulin glargine  10 Units Subcutaneous Daily  . ipratropium-albuterol  3 mL Nebulization BID  . lisinopril  10 mg Oral Daily  . metoprolol tartrate  12.5 mg Oral Q6H  . multivitamin with minerals  1 tablet Oral Daily  . Resource ThickenUp Clear   Oral Once  . sodium chloride flush  10-40 mL Intracatheter Q12H   Continuous Infusions: . sodium chloride Stopped (02/28/19 2112)     LOS: 14 days     Jacquelin Hawking, MD [facility] 03/05/2019, 12:58 PM  If 7PM-7AM, please contact night-coverage [email or web address]

## 2019-03-05 NOTE — Progress Notes (Signed)
IR requested by Dr. Caleb Popp for possible image-guided thoracentesis.  Limited chest US revealed trace fluid that could not be safely accessed with procedure today. Images sent to Dr. Grace Isaac for review. Informed patient that procedure will not occur today. Will make Dr. Caleb Popp aware.  IR available in future if needed.   Waylan Boga Daisa Stennis, PA-C 03/05/2019, 9:40 AM

## 2019-03-06 DIAGNOSIS — T17908D Unspecified foreign body in respiratory tract, part unspecified causing other injury, subsequent encounter: Secondary | ICD-10-CM

## 2019-03-06 DIAGNOSIS — T17908A Unspecified foreign body in respiratory tract, part unspecified causing other injury, initial encounter: Secondary | ICD-10-CM

## 2019-03-06 LAB — CBC
HCT: 45.5 % (ref 39.0–52.0)
Hemoglobin: 14.5 g/dL (ref 13.0–17.0)
MCH: 30.2 pg (ref 26.0–34.0)
MCHC: 31.9 g/dL (ref 30.0–36.0)
MCV: 94.8 fL (ref 80.0–100.0)
Platelets: 227 10*3/uL (ref 150–400)
RBC: 4.8 MIL/uL (ref 4.22–5.81)
RDW: 13.4 % (ref 11.5–15.5)
WBC: 13.5 10*3/uL — ABNORMAL HIGH (ref 4.0–10.5)
nRBC: 0 % (ref 0.0–0.2)

## 2019-03-06 LAB — GLUCOSE, CAPILLARY
Glucose-Capillary: 123 mg/dL — ABNORMAL HIGH (ref 70–99)
Glucose-Capillary: 101 mg/dL — ABNORMAL HIGH (ref 70–99)
Glucose-Capillary: 100 mg/dL — ABNORMAL HIGH (ref 70–99)
Glucose-Capillary: 117 mg/dL — ABNORMAL HIGH (ref 70–99)

## 2019-03-06 LAB — BASIC METABOLIC PANEL
Anion gap: 12 (ref 5–15)
BUN: 12 mg/dL (ref 8–23)
CO2: 32 mmol/L (ref 22–32)
Calcium: 8.9 mg/dL (ref 8.9–10.3)
Chloride: 98 mmol/L (ref 98–111)
Creatinine, Ser: 0.85 mg/dL (ref 0.61–1.24)
GFR calc Af Amer: >60 mL/min (ref >60)
GFR calc non Af Amer: >60 mL/min (ref >60)
Glucose, Bld: 122 mg/dL — ABNORMAL HIGH (ref 70–99)
Potassium: 3.8 mmol/L (ref 3.5–5.1)
Sodium: 142 mmol/L (ref 135–145)

## 2019-03-06 LAB — PROCALCITONIN: Procalcitonin: 0.22 ng/mL

## 2019-03-06 NOTE — Progress Notes (Signed)
PROGRESS NOTE    Tyrone Cunningham  CVE:[id number] DOB: 17-Jun-1955 DOA: 02/19/2019 PCP: System, Pcp Not In     Brief Narrative:  Tyrone Cunningham is a 64 y.o.malewith past medical history significant ofchronic CHF, diabetes, chronic pain syndrome,chronic trach, wasbr[facility]to EDvia EMS [facility] for evaluation of altered mental status.Per nursing home staff, pt appearsto have right sided facial drooping, right-sided weakness, andnot behaving himself this mornnig. Last known normal was day before admission. He is usually mostly bedbound but can walk on occasion. He is trach dependent. In the ED, head CT showed no acute changes and CTA showed no LVO. He was outside of the tPA window and not a candidate for thrombectomy because he did not have a large vessel occlusion.Neurology consulted. Hospitalization further complicated by aspiration and pneumonia.   New events last 24 hours / Subjective: Per nursing, patient had copious secretions overnight and was made NPO. Repeat SLP eval pending this morning.   Assessment & Plan:   Active Problems:   Hypertension   Tracheostomy dependent (HCC)   DM (diabetes mellitus), type 2 with neurological complications (HCC)   Palliative care encounter   Stroke (cerebrum) (HCC)   Chronic diastolic CHF (congestive heart failure) (HCC)   Hemiparesis of right dominant side (HCC)   Aspiration pneumonia of left lung (HCC)    Acute stroke -With associated right sided hemiplegia and facial droop. 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 Resultant cortical blindness. -Neurology recommendations: DAPT for 3 months followed by aspirin alone -PT/OT recommending SNF -Aspirin/Plavix, Lipitor   Dysphagia -In setting of stroke. Failed swallow study and NG tube placed for feeding. Pulled out NG tube and does not want it reinserted. Patient amenable to PEG tube. MBS on  12/28 was favorable and patient started on a Dysphagia 1 diet. -Reconsulted SLP today due to events overnight   OSA Acute on chronic respiratory failure with hypoxia and hypercapnia -Patient is s/p tracheostomy. Recently treated for RLL pneumonia and completed course of antibiotics. Chest x-ray obtained suggested moderately sized effusion. [place] thora ordered but with evidence of low amount of fluid.  -Lasix given  -Continue oxygen, wean as able  Acute metabolic encephalopathy -Waxing and wanes   Essential hypertension -Patient is on lisinopril and metoprolol as an outpatient. Held on admission for permissive hypertension. -Continue home metoprolol, lisinopril   Sepsis -Secondary to pneumonia. Not present on admission. Resolved.  Abdominal pain -Unknown etiology. Associated distension. Abdominal x-ray unremarkable.  Hypernatremia -Iatrogenic in setting of normal saline. Resolved.   Right lower lobe pneumonia -Patient empirically started on Vancomycin, Ceftriaxone and Flagyl. Completed course.  Positive blood culture -1/4 bottles positive with GPC now found to be coagulase negative staph species. Likely contamination. Vancomycin discontinued.  Chronic diastolic heart failure -Continue Digoxin  Diabetes mellitus, type 2 -Insulin dependent. Hemoglobin A1C of 8.3% -Continue Lantus and SSI   Lung nodule -Incidental finding. 10mm in size. Non-contrast CT in 12 months if high risk. Will need outpatient follow-up.  Goals of care -Palliative care consulted and after discussions with patient and family, patient will remain full code with full scope care.   DVT prophylaxis: Lovenox Code Status: Full Family Communication: None at bedside Disposition Plan: Pending SLP re-eval, wean O2, back to SNF eventually   Consultants:   Neurology  Palliative care medicine    Antimicrobials:  Anti-infectives (From admission, onward)   Start     Dose/Rate Route Frequency  Ordered Stop  02/22/19 1630  fluconazole (DIFLUCAN) IVPB 100 mg     100 mg 50 mL/hr over 60 Minutes Intravenous Every 24 hours 02/22/19 1619 02/28/19 1900   02/22/19 1400  metroNIDAZOLE (FLAGYL) IVPB 500 mg  Status:  Discontinued     500 mg 100 mL/hr over 60 Minutes Intravenous Every 8 hours 02/22/19 0936 02/28/19 1345   02/22/19 1400  cefTRIAXone (ROCEPHIN) 2 g in sodium chloride 0.9 % 100 mL IVPB  Status:  Discontinued     2 g 200 mL/hr over 30 Minutes Intravenous Every 24 hours 02/22/19 0936 02/28/19 1345   02/22/19 0200  vancomycin (VANCOCIN) IVPB 1000 mg/200 mL premix  Status:  Discontinued     1,000 mg 200 mL/hr over 60 Minutes Intravenous Every 12 hours 02/21/19 1314 02/25/19 1423   02/21/19 1400  piperacillin-tazobactam (ZOSYN) IVPB 3.375 g  Status:  Discontinued     3.375 g 100 mL/hr over 30 Minutes Intravenous Every 8 hours 02/21/19 1228 02/21/19 1251   02/21/19 1400  piperacillin-tazobactam (ZOSYN) IVPB 3.375 g  Status:  Discontinued     3.375 g 12.5 mL/hr over 240 Minutes Intravenous Every 8 hours 02/21/19 1252 02/22/19 0936   02/21/19 1400  vancomycin (VANCOREADY) IVPB 2000 mg/400 mL     2,000 mg 200 mL/hr over 120 Minutes Intravenous  Once 02/21/19 1314 02/21/19 1615        Objective: Vitals:   03/06/19 0312 03/06/19 0354 03/06/19 0800 03/06/19 0816  BP:  131/71 (!) 183/85   Pulse: (!) 103 (!) 101 (!) 106   Resp: 18 18 20    Temp:  (!) 97.5 F (36.4 C) 97.6 F (36.4 C)   TempSrc:  Oral Oral   SpO2: 93% 96% 98% 93%  Weight:      Height:        Intake/Output Summary (Last 24 hours) at 03/06/2019 1027 Last data filed at 03/06/2019 1013 Gross per 24 hour  Intake 10 ml  Output --  Net 10 ml   Filed Weights   03/04/19 0432 03/05/19 0320 03/06/19 0311  Weight: 122.9 kg 120.5 kg 116.5 kg    Examination:  General exam: Appears calm and comfortable  Respiratory system: Clear to auscultation anteriorly. Respiratory effort normal. On trach collar without distress   Cardiovascular system: S1 & S2 heard, RRR. No murmurs. No pedal edema. Gastrointestinal system: Abdomen is nondistended, soft and nontender.  Central nervous system: Alert Extremities: Symmetric in appearance  Skin: No rashes, lesions or ulcers on exposed skin    Data Reviewed: I have personally reviewed following labs and imaging studies  CBC: Recent Labs  Lab 02/28/19 1529 03/01/19 0457 03/04/19 0800 03/05/19 1124 03/06/19 0540  WBC 13.5* 12.9* 13.1* 12.8* 13.5*  HGB 14.0 13.0 13.6 13.8 14.5  HCT 42.7 40.0 42.5 43.4 45.5  MCV 93.4 93.2 95.1 93.3 94.8  PLT 208 220 200 198 748   Basic Metabolic Panel: Recent Labs  Lab 02/28/19 0052 03/01/19 0457 03/04/19 0842 03/05/19 1124 03/06/19 0540  NA 146* 145 143 142 142  K 3.5 3.2* 3.7 4.0 3.8  CL 110 108 101 103 98  CO2 27 28 35* 34* 32  GLUCOSE 114* 171* 159* 151* 122*  BUN 17 17 9 13 12   CREATININE 0.96 0.87 0.89 0.86 0.85  CALCIUM 8.2* 8.5* 9.2 8.7* 8.9  MG 2.3  --   --   --   --    GFR: Estimated Creatinine Clearance: 105.8 mL/min (by C-G formula based on SCr of 0.85 mg/dL). Liver  Function Tests: Recent Labs  Lab 03/05/19 1124  AST 22  ALT 19  ALKPHOS 63  BILITOT 0.4  PROT 5.8*  ALBUMIN 1.8*   No results for input(s): LIPASE, AMYLASE in the last 168 hours. No results for input(s): AMMONIA in the last 168 hours. Coagulation Profile: No results for input(s): INR, PROTIME in the last 168 hours. Cardiac Enzymes: No results for input(s): CKTOTAL, CKMB, CKMBINDEX, TROPONINI in the last 168 hours. BNP (last 3 results) No results for input(s): PROBNP in the last 8760 hours. HbA1C: No results for input(s): HGBA1C in the last 72 hours. CBG: Recent Labs  Lab 03/05/19 1712 03/05/19 2012 03/05/19 2356 03/06/19 0354 03/06/19 0802  GLUCAP 148* 127* 152* 123* 101*   Lipid Profile: No results for input(s): CHOL, HDL, LDLCALC, TRIG, CHOLHDL, LDLDIRECT in the last 72 hours. Thyroid Function Tests: No results  for input(s): TSH, T4TOTAL, FREET4, T3FREE, THYROIDAB in the last 72 hours. Anemia Panel: No results for input(s): VITAMINB12, FOLATE, FERRITIN, TIBC, IRON, RETICCTPCT in the last 72 hours. Sepsis Labs: Recent Labs  Lab 03/05/19 1124 03/06/19 0540  PROCALCITON 0.22 0.22    Recent Results (from the past 240 hour(s))  Culture, Urine     Status: None   Collection Time: 03/02/19  8:49 AM   Specimen: Urine, Catheterized  Result Value Ref Range Status   Specimen Description URINE, CATHETERIZED  Final   Special Requests NONE  Final   Culture   Final    NO GROWTH Performed at [facility], [address], [place], [place] [address]    Report Status 03/03/2019 FINAL  Final  Culture, respiratory     Status: None   Collection Time: 03/03/19  7:18 PM   Specimen: Tracheal Aspirate  Result Value Ref Range Status   Specimen Description TRACHEAL ASPIRATE  Final   Special Requests NONE  Final   Gram Stain   Final    FEW SQUAMOUS EPITHELIAL CELLS PRESENT ABUNDANT WBC PRESENT, PREDOMINANTLY PMN ABUNDANT GRAM POSITIVE RODS RARE GRAM POSITIVE COCCI Performed at [facility], [address] [place], [place] [address]    Culture ABUNDANT STAPHYLOCOCCUS EPIDERMIDIS  Final   Report Status 03/05/2019 FINAL  Final   Organism ID, Bacteria STAPHYLOCOCCUS EPIDERMIDIS  Final      Susceptibility   Staphylococcus epidermidis - MIC*    CIPROFLOXACIN 1 SENSITIVE Sensitive     ERYTHROMYCIN >=8 RESISTANT Resistant     GENTAMICIN <=0.5 SENSITIVE Sensitive     OXACILLIN >=4 RESISTANT Resistant     TETRACYCLINE >=16 RESISTANT Resistant     VANCOMYCIN 1 SENSITIVE Sensitive     TRIMETH/SULFA 160 RESISTANT Resistant     CLINDAMYCIN <=0.25 SENSITIVE Sensitive     RIFAMPIN <=0.5 SENSITIVE Sensitive     Inducible Clindamycin NEGATIVE Sensitive     * ABUNDANT STAPHYLOCOCCUS EPIDERMIDIS      Radiology Studies: [place] CHEST (PLEURAL EFFUSION)  Result Date: 03/05/2019 CLINICAL DATA:  Acute on chronic  respiratory failure. Evaluate for left-sided pleural effusion and perform ultrasound-guided thoracentesis as indicated. EXAM: CHEST ULTRASOUND COMPARISON:  Chest radiograph-03/05/2019 FINDINGS: Sonographic evaluation of the left chest is negative for any significant pleural fluid. As such, ultrasound-guided thoracentesis was not attempted. IMPRESSION: No significant left-sided pleural effusion. Ultrasound-guided thoracentesis was not attempted. Electronically Signed   By: Simonne Come M.D.   On: 03/05/2019 10:01   DG CHEST PORT 1 VIEW  Result Date: 03/06/2019 CLINICAL DATA:  Aspiration into airway. EXAM: PORTABLE CHEST 1 VIEW COMPARISON:  Radiograph  earlier this day FINDINGS: Tracheostomy tube tip at the thoracic inlet. Similar hazy opacity at the right lung base likely combination of airspace disease and pleural fluid. Left lung appears clear. Unchanged heart size and mediastinal contours. No pneumothorax. Multiple overlying monitoring devices. IMPRESSION: 1. No significant change from earlier this day. Unchanged hazy opacity at the right lung base, likely combination of airspace disease and pleural fluid. 2. Tracheostomy tube tip at the thoracic inlet. Electronically Signed   By: Narda Rutherford M.D.   On: 03/06/2019 01:07   DG CHEST PORT 1 VIEW  Result Date: 03/05/2019 CLINICAL DATA:  Acute on chronic respiratory failure. EXAM: PORTABLE CHEST 1 VIEW COMPARISON:  March 04, 2019. FINDINGS: Stable cardiomediastinal silhouette. Tracheostomy tube is unchanged in position. No pneumothorax is noted. Left lung is clear. Mild right pleural effusion is noted with associated atelectasis or infiltrate. Bony thorax is unremarkable. IMPRESSION: Stable mild right pleural effusion with associated atelectasis or infiltrate. Stable tracheostomy tube. No pneumothorax is noted. Electronically Signed   By: Lupita Raider M.D.   On: 03/05/2019 09:27   DG CHEST PORT 1 VIEW  Result Date: 03/04/2019 CLINICAL DATA:  Reason for  exam: acute on chronic respiratory failure Patient non verbal throughout exam. Showed difficulty holding still for images. Hx of chf, bronchitis, copd, diabetes, htn. Hx alveoloplasty 2014, tracheostomy. Quit smoking 2001. EXAM: PORTABLE CHEST 1 VIEW COMPARISON:  Chest radiograph 02/21/2019 FINDINGS: Stable cardiomediastinal contours. Tracheostomy in place. Moderate size right layering pleural effusion. Persistent right basilar consolidative opacity could reflect atelectasis or infection. Left lung is clear. No pneumothorax. IMPRESSION: Small to moderate size right pleural effusion with persistent right basilar opacity possibly atelectasis or infection. Electronically Signed   By: Emmaline Kluver M.D.   On: 03/04/2019 11:40      Scheduled Meds:   stroke: mapping our early stages of recovery book   Does not apply Once   aspirin EC  325 mg Oral Daily   atorvastatin  20 mg Oral q1800   budesonide  0.5 mg Nebulization BID   clopidogrel  75 mg Oral Daily   digoxin  0.25 mg Oral Daily   divalproex  500 mg Oral Q8H   enoxaparin (LOVENOX) injection  60 mg Subcutaneous Q24H   feeding supplement (ENSURE ENLIVE)  237 mL Oral BID BM   insulin aspart  0-5 Units Subcutaneous QHS   insulin aspart  0-9 Units Subcutaneous TID WC   insulin glargine  10 Units Subcutaneous Daily   ipratropium-albuterol  3 mL Nebulization BID   lisinopril  10 mg Oral Daily   metoprolol tartrate  12.5 mg Oral Q6H   multivitamin with minerals  1 tablet Oral Daily   Resource ThickenUp Clear   Oral Once   sodium chloride flush  10-40 mL Intracatheter Q12H   Continuous Infusions:  sodium chloride Stopped (02/28/19 2112)     LOS: 15 days      Time spent: 35 minutes   Noralee Stain, DO [facility] 03/06/2019, 10:27 AM   Available via Epic secure chat 7am-7pm After these hours, please refer to coverage provider listed on [email or web address]

## 2019-03-06 NOTE — Progress Notes (Signed)
  Speech Language Pathology Treatment: Dysphagia  Patient Details Name: Tyrone Cunningham MRN: [id number] DOB: Jul 02, 1955 Today's Date: 03/06/2019 Time: 1210-1232 SLP Time Calculation (min) (ACUTE ONLY): 22 min  Assessment / Plan / Recommendation Clinical Impression  BSE order received/acknowledged and pt is already on ST caseload.  Pt was seen for dysphagia treatment in the setting of diet change to NPO last night (03/05/19) secondary to increased secretions from the trach following meds with concerns for aspiration.  Unknown if PMV was donned for medication consumption.  Pt consumed trials of nectar-thick liquid (tsp/straw) and puree via tsp.  PMV was donned for all po trials.  Pt initially exhibited good bolus acceptance with trials, but he required verbal cues for labial closure as tx session progressed (suspect secondary to fatigue and cognitive deficits).  Pt exhibited prolonged AP transport with all trials and suspect delayed swallow initiation.  Pt with multiple swallows per bolus with straw sips of nectar-thick liquid and puree, possibly indicative of pharyngeal residue.  He tolerated tsp of nectar-thick liquid the best during this session. O2 saturation was observed to drop to the low 90s intermittently during po trials and 10-15 minutes into the session it briefly dropped into the low-mid 80s.  PMV was doffed and O2 saturation increased back to high 90s.  Recommend continuation of NPO at this time with essential meds crushed in puree.  If po meds are given, PMV must be donned during consumption.  SLP will f/u for dysphagia tx per POC.     HPI HPI: Tyrone Cunningham is a 64 y.o. male with medical history significant of CHF, diabetes, chronic pain syndrome, was brought to ED via EMS from [facility] nursing facility for evaluation of altered mental status. Per nursing home staff, pt appears to have right sided facial drooping, and right-sided weakness. 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 He is  trach dependent.  No previous ST notes in Epic.  Pt reported that he does not wear a PMV and usually finger occludes or mouths words to communicate.  MBS 02/22/19 with recs for NPO.  Repeat MBS 12/28 much improved; started on dys1, thin liquids.  Pt had functional difficulty with thins at mealtime, so liquids were thickened to nectar and he appeared to tolerate much better.       SLP Plan  Continue with current plan of care       Recommendations  Diet recommendations: NPO Medication Administration: Crushed with puree(for essential meds) Supervision: Full supervision/cueing for compensatory strategies                Oral Care Recommendations: Oral care QID;Staff/trained caregiver to provide oral care Follow up Recommendations: Skilled Nursing facility SLP Visit Diagnosis: Dysphagia, oropharyngeal phase (R13.12) Plan: Continue with current plan of care                     Villa Herb M.S., CCC-SLP Acute Rehabilitation Services Office: [phone]  Shanon Rosser [facility] 03/06/2019, 12:41 PM

## 2019-03-06 NOTE — Progress Notes (Signed)
Daily Progress Note   Patient Name: Tyrone Cunningham       Date: 03/06/2019 DOB: 1956/01/04  Age: 64 y.o. MRN#: [id number] Attending Physician: Noralee Stain, DO Primary Care Physician: System, Pcp Not In Admit Date: 02/19/2019  Reason for Consultation/Follow-up: Establishing goals of care and Psychosocial/spiritual support  Subjective: Patient in bed with no clothes on.  Condom cath pulled off and urine on the floor.  Per tech patient was just cleaned up and has been agitated.  Discussed with RN.  Patient is orientated to person and place.  Is on 8L of oxygen trach collar, vs 10L yesterday.  Does well with eating, drinking, taking meds IF the purple passy muir valve is in place.  SLP eval ordered and currently NPO.     Assessment: AMS waxes and wanes.  Likely swallowing ability does as well.  Partially blind.  Partially deaf.  Hemiplegia on the right.   Patient Profile/HPI: 64 y.o. male  with past medical history of  COPD, chronic pain, OSA with chronic tracheostomy, HFpEF, DM, and hearing loss who was admitted on 02/19/2019 from SNF with altered mental status and right sided weakness.  He was found to have a stroke and pneumonia.  He has failed his swallow study and refuses a PEG tube.  Later he agreed to PEG tube.  Fortunately swallow study improved.  Patient's mental status and ability to swallow has waxed and waned.   Length of Stay: 15  Current Medications: Scheduled Meds:  .  stroke: mapping our early stages of recovery book   Does not apply Once  . aspirin EC  325 mg Oral Daily  . atorvastatin  20 mg Oral q1800  . budesonide  0.5 mg Nebulization BID  . clopidogrel  75 mg Oral Daily  . digoxin  0.25 mg Oral Daily  . divalproex  500 mg Oral Q8H  . enoxaparin (LOVENOX) injection  60  mg Subcutaneous Q24H  . feeding supplement (ENSURE ENLIVE)  237 mL Oral BID BM  . insulin aspart  0-5 Units Subcutaneous QHS  . insulin aspart  0-9 Units Subcutaneous TID WC  . insulin glargine  10 Units Subcutaneous Daily  . ipratropium-albuterol  3 mL Nebulization BID  . lisinopril  10 mg Oral Daily  . metoprolol tartrate  12.5 mg Oral Q6H  . multivitamin  with minerals  1 tablet Oral Daily  . Resource ThickenUp Clear   Oral Once  . sodium chloride flush  10-40 mL Intracatheter Q12H    Continuous Infusions: . sodium chloride Stopped (02/28/19 2112)    PRN Meds: sodium chloride, acetaminophen **OR** acetaminophen (TYLENOL) oral liquid 160 mg/5 mL **OR** acetaminophen, albuterol, haloperidol lactate, LORazepam, morphine injection, sodium chloride flush  Physical Exam        Obese male, lying naked in bed, urine the floor, condom cath off. CV tachycardic Resp trach collar, no respiratory distress Abdomen obese, ND Neuro:  Patient with difficulty hearing, difficulty seeing, hemiplegia  Vital Signs: BP (!) 183/85 (BP Location: Right Arm)   Pulse (!) 106   Temp 97.6 F (36.4 C) (Oral)   Resp 20   Ht 5\' 7"  (1.702 m)   Wt 116.5 kg   SpO2 93%   BMI 40.23 kg/m  SpO2: SpO2: 93 % O2 Device: O2 Device: Tracheostomy Collar O2 Flow Rate: O2 Flow Rate (L/min): 8 L/min  Intake/output summary:   Intake/Output Summary (Last 24 hours) at 03/06/2019 1103 Last data filed at 03/06/2019 1013 Gross per 24 hour  Intake 10 ml  Output --  Net 10 ml   LBM: Last BM Date: 03/05/19 Baseline Weight: Weight: 122.5 kg Most recent weight: Weight: 116.5 kg       Palliative Assessment/Data: 20%    Flowsheet Rows     Most Recent Value  Intake Tab  Referral Department  Hospitalist  Unit at Time of Referral  Med/Surg Unit  Palliative Care Primary Diagnosis  Neurology  Date Notified  02/24/19  Palliative Care Type  New Palliative care  Reason for referral  Clarify Goals of Care  Date of  Admission  02/19/19  Date first seen by Palliative Care  02/25/19  # of days Palliative referral response time  1 Day(s)  # of days IP prior to Palliative referral  5  Clinical Assessment  Psychosocial & Spiritual Assessment  Palliative Care Outcomes      Patient Active Problem List   Diagnosis Date Noted  . CHF (congestive heart failure) (HCC)   . Aspiration pneumonia of left lung (HCC)   . Hemiparesis of right dominant side (HCC)   . Chronic diastolic CHF (congestive heart failure) (HCC) 02/23/2019  . 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 HCAP (healthcare-associated pneumonia) 10/09/2017  . Hypokalemia 10/09/2017  . Abnormal liver function 10/09/2017  . Severe protein-calorie malnutrition (HCC) 10/09/2017  . Cellulitis and abscess of left leg 08/04/2017  . Diabetic foot ulcer (HCC) 08/04/2017  . Acute renal failure (ARF) (HCC) 08/04/2017  . SOB (shortness of breath) 09/13/2016  . Peripheral vascular disease (HCC) 08/19/2016  . Tracheostomy care (HCC) 06/28/2015  . Obstructive sleep apnea 06/28/2015  . Chronic pain syndrome 04/18/2015  . Constipation 01/07/2014  . Constipation, chronic 01/04/2014  . Chronic respiratory failure (HCC) 01/06/2013  . Palliative care encounter 03/16/2012  . Edema 03/16/2012  . Obesity 10/20/2011  . 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 [name] with frequent exacerbations   . Hypertension   . Hypercholesteremia   . Hearing loss   . Diabetic neuropathy [facility])     Palliative Care Plan    Recommendations/Plan:  Patient with AMS.  Cant tolerate having a hospital gown on.  NPO once again.  Attempted to call brother to arrange GOC conference call with patient's 3 brothers.  Will attempt to reach brothers  again tomorrow.  Goals of Care and Additional Recommendations:  Limitations on Scope of Treatment: Full Scope Treatment  Code Status:  Full  code  Prognosis:   < 6 months given patient's intermittent AMS and NPO status, bedbound, risk of aspiration.   Discharge Planning:  [facility] [place] for rehab with Palliative care service follow-up  Thank you for allowing the Palliative Medicine Team to assist in the care of this patient.  Total time spent:  25 min.     Greater than 50%  of this time was spent counseling and coordinating care related to the above assessment and plan.  Florentina Jenny, PA-C Palliative Medicine  Please contact Palliative MedicineTeam phone at [phone] for questions and concerns between 7 am - 7 pm.   Please see AMION for individual provider pager numbers.

## 2019-03-07 ENCOUNTER — Inpatient Hospital Stay (HOSPITAL_COMMUNITY): Payer: Medicare Other

## 2019-03-07 DIAGNOSIS — J9620 Acute and chronic respiratory failure, unspecified whether with hypoxia or hypercapnia: Secondary | ICD-10-CM

## 2019-03-07 LAB — GLUCOSE, CAPILLARY
Glucose-Capillary: 100 mg/dL — ABNORMAL HIGH (ref 70–99)
Glucose-Capillary: 116 mg/dL — ABNORMAL HIGH (ref 70–99)
Glucose-Capillary: 75 mg/dL (ref 70–99)
Glucose-Capillary: 89 mg/dL (ref 70–99)
Glucose-Capillary: 90 mg/dL (ref 70–99)
Glucose-Capillary: 90 mg/dL (ref 70–99)
Glucose-Capillary: 90 mg/dL (ref 70–99)
Glucose-Capillary: 93 mg/dL (ref 70–99)

## 2019-03-07 LAB — CBC
HCT: 43.7 % (ref 39.0–52.0)
Hemoglobin: 14 g/dL (ref 13.0–17.0)
MCH: 30 pg (ref 26.0–34.0)
MCHC: 32 g/dL (ref 30.0–36.0)
MCV: 93.8 fL (ref 80.0–100.0)
Platelets: 229 10*3/uL (ref 150–400)
RBC: 4.66 MIL/uL (ref 4.22–5.81)
RDW: 13.4 % (ref 11.5–15.5)
WBC: 11.2 10*3/uL — ABNORMAL HIGH (ref 4.0–10.5)
nRBC: 0 % (ref 0.0–0.2)

## 2019-03-07 LAB — PROCALCITONIN: Procalcitonin: 0.23 ng/mL

## 2019-03-07 LAB — DIGOXIN LEVEL: Digoxin Level: 0.9 ng/mL (ref 0.8–2.0)

## 2019-03-07 MED ORDER — INSULIN ASPART 100 UNIT/ML ~~LOC~~ SOLN
0.0000 [IU] | SUBCUTANEOUS | Status: DC
  Administered 2019-03-09 – 2019-03-11 (×3): 2 [IU] via SUBCUTANEOUS

## 2019-03-07 MED ORDER — INSULIN ASPART 100 UNIT/ML ~~LOC~~ SOLN: 0.0000 [IU] | Freq: Every day | SUBCUTANEOUS | Status: DC

## 2019-03-07 NOTE — Progress Notes (Signed)
Daily Progress Note   Patient Name: Tyrone Cunningham       Date: 03/07/2019 DOB: 02-03-1956  Age: 64 y.o. MRN#: [id number] Attending Physician: Dessa Phi, DO Primary Care Physician: System, Pcp Not In Admit Date: 02/19/2019  Reason for Consultation/Follow-up: Establishing goals of care and Psychosocial/spiritual support  Chart reviewed.  Patient remains NPO. Remains on increased level of oxygen  Subjective:  Spoke with brother Tyrone Cunningham on the phone and provided an update.  Expressed concern that going forward Tyrone Cunningham may continue to wax and wane in his mental status, ability to swallow and respiratory status.  Tyrone Cunningham seemed to understand.  We discussed his quality of life and what recurrent illnesses and set backs will do to his reserve over time.  I expressed a concern that Tyrone Cunningham will not be able to eat enough (reliably) to support his nutritional needs and the topic of PEG tube may come up again.  Tyrone Cunningham made the excellent point that if Tyrone Cunningham is refusing to wear clothes and pulling off his condom cath he would most likely pull out a PEG.  We discussed the effects of the stroke on Iyad including worsening of his ability to swallow, AMS, and partial blindness.  Tyrone Cunningham mentions that Tyrone Cunningham had difficulty seeing prior to the stroke as well as intermittent AMS.   I offered to have a conference call with all 3 brothers to explain Tyrone Cunningham's situation and answer questions.  Tyrone Cunningham is in close contact with 1 brother but has been unable to reach his other brother.  He defers the offer for a conference call at this point.  Tyrone Cunningham has questions about Tyrone Cunningham's return to SNF and whether or not he is able to change SNFs.  These need to be addressed by [facility] social work.   Assessment: Patient remains confused and NPO.  SLP working  regularly with him.   Patient Profile/HPI:  64 y.o. male  with past medical history of  COPD, chronic pain, OSA with chronic tracheostomy, HFpEF, DM, and hearing loss who was admitted on 02/19/2019 from SNF with altered mental status and right sided weakness.  He was found to have a stroke and pneumonia.  He has failed his swallow study and refuses a PEG tube.     Length of Stay: 16  Current Medications: Scheduled Meds:  .  stroke: mapping  our early stages of recovery book   Does not apply Once  . aspirin EC  325 mg Oral Daily  . atorvastatin  20 mg Oral q1800  . budesonide  0.5 mg Nebulization BID  . clopidogrel  75 mg Oral Daily  . digoxin  0.25 mg Oral Daily  . divalproex  500 mg Oral Q8H  . enoxaparin (LOVENOX) injection  60 mg Subcutaneous Q24H  . feeding supplement (ENSURE ENLIVE)  237 mL Oral BID BM  . insulin aspart  0-5 Units Subcutaneous QHS  . insulin aspart  0-9 Units Subcutaneous TID WC  . insulin glargine  10 Units Subcutaneous Daily  . ipratropium-albuterol  3 mL Nebulization BID  . lisinopril  10 mg Oral Daily  . metoprolol tartrate  12.5 mg Oral Q6H  . multivitamin with minerals  1 tablet Oral Daily  . Resource ThickenUp Clear   Oral Once  . sodium chloride flush  10-40 mL Intracatheter Q12H    Continuous Infusions: . sodium chloride Stopped (02/28/19 2112)    PRN Meds: sodium chloride, acetaminophen **OR** acetaminophen (TYLENOL) oral liquid 160 mg/5 mL **OR** acetaminophen, albuterol, haloperidol lactate, LORazepam, morphine injection, sodium chloride flush  Physical Exam       Well developed obese male, awake, eyes open but not focused.  Requiring suctioning of secretions. CV rrr resp no distress Abdomen soft, nt, nd    Vital Signs: BP 137/61 (BP Location: Right Arm)   Pulse 91   Temp 98.3 F (36.8 C) (Axillary)   Resp 20   Ht 5\' 7"  (1.702 m)   Wt 114.4 kg   SpO2 90%   BMI 39.50 kg/m  SpO2: SpO2: 90 % O2 Device: O2 Device: Tracheostomy  Collar O2 Flow Rate: O2 Flow Rate (L/min): 8 L/min  Intake/output summary: No intake or output data in the 24 hours ending 03/07/19 1146 LBM: Last BM Date: 03/06/19 Baseline Weight: Weight: 122.5 kg Most recent weight: Weight: 114.4 kg       Palliative Assessment/Data:20%    Flowsheet Rows     Most Recent Value  Intake Tab  Referral Department  Hospitalist  Unit at Time of Referral  Med/Surg Unit  Palliative Care Primary Diagnosis  Neurology  Date Notified  02/24/19  Palliative Care Type  New Palliative care  Reason for referral  Clarify Goals of Care  Date of Admission  02/19/19  Date first seen by Palliative Care  02/25/19  # of days Palliative referral response time  1 Day(s)  # of days IP prior to Palliative referral  5  Clinical Assessment  Psychosocial & Spiritual Assessment  Palliative Care Outcomes      Patient Active Problem List   Diagnosis Date Noted  . CHF (congestive heart failure) (HCC)   . Aspiration into airway   . Aspiration pneumonia of left lung (HCC)   . Hemiparesis of right dominant side (HCC)   . Chronic diastolic CHF (congestive heart failure) (HCC) 02/23/2019  . 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 HCAP (healthcare-associated pneumonia) 10/09/2017  . Hypokalemia 10/09/2017  . Abnormal liver function 10/09/2017  . Severe protein-calorie malnutrition (HCC) 10/09/2017  . Cellulitis and abscess of left leg 08/04/2017  . Diabetic foot ulcer (HCC) 08/04/2017  . Acute renal failure (ARF) (HCC) 08/04/2017  . SOB (shortness of breath) 09/13/2016  . Peripheral vascular disease (HCC) 08/19/2016  . Tracheostomy care (HCC) 06/28/2015  . Obstructive sleep apnea 06/28/2015  . Chronic pain syndrome 04/18/2015  . Constipation 01/07/2014  . Constipation, chronic  01/04/2014  . Chronic respiratory failure (HCC) 01/06/2013  . Palliative care encounter 03/16/2012  . Edema 03/16/2012  . Obesity 10/20/2011  . DM (diabetes mellitus), type 2 with neurological  complications (HCC) 10/17/2011  . 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 [name] with frequent exacerbations   . Hypertension   . Hypercholesteremia   . Hearing loss   . Diabetic neuropathy [facility])     Palliative Care Plan    Recommendations/Plan:  Continue current care.    Palliative will continue to touch base intermittently.  I feel his status will have to change significantly before there is a change in his goals of care.  Will need to discuss PEG if patient can not eat.  Tyrone Cunningham feels as though Shae may pull a PEG out.  Tyrone Cunningham is understanding and reasonable, but at this point he is not ready to shift goals.  Goals of Care and Additional Recommendations:  Limitations on Scope of Treatment: Full Scope Treatment  Code Status:  DNR  Prognosis:   Unable to determine.  Patient is currently NPO with altered mental status.  He is at high risk of acute decompensation.  Discharge Planning:  Skilled Nursing Facility for rehab with Palliative care service follow-up if he stabilizes.   Thank you for allowing the Palliative Medicine Team to assist in the care of this patient.  Total time spent:  25 min.     Greater than 50%  of this time was spent counseling and coordinating care related to the above assessment and plan.  Tyrone Richards, PA-C Palliative Medicine  Please contact Palliative MedicineTeam phone at [phone] for questions and concerns between 7 am - 7 pm.   Please see AMION for individual provider pager numbers.

## 2019-03-07 NOTE — Progress Notes (Signed)
PROGRESS NOTE    Tyrone Cunningham  CBJ:[id number] DOB: Jun 12, 1955 DOA: 02/19/2019 PCP: System, Pcp Not In     Brief Narrative:  Tyrone Cunningham is a 64 y.o.malewith past medical history significant ofchronic CHF, diabetes, chronic pain syndrome,chronic trach, wasbroughtto EDvia EMS from [facility] facility for evaluation of altered mental status.Per nursing home staff, pt appearsto have right sided facial drooping, right-sided weakness, andnot behaving himself this mornnig. Last known normal was day before admission. He is usually mostly bedbound but can walk on occasion. He is trach dependent. In the ED, head CT showed no acute changes and CTA showed no LVO. He was outside of the tPA window and not a candidate for thrombectomy because he did not have a large vessel occlusion.Neurology consulted. Hospitalization further complicated by aspiration and pneumonia.   New events last 24 hours / Subjective: No new issues overnight.  Currently n.p.o. pending ongoing SLP evaluation  Assessment & Plan:   Active Problems:   Hypertension   Tracheostomy dependent ([place])   DM (diabetes mellitus), type 2 with neurological complications ([place])   Palliative care encounter   Stroke (cerebrum) (HCC)   Chronic diastolic CHF (congestive heart failure) (HCC)   Hemiparesis of right dominant side (HCC)   Aspiration pneumonia of left lung (HCC)   Aspiration into airway    Acute stroke -With associated right sided hemiplegia and facial droop. 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 Resultant cortical blindness. -Neurology recommendations: DAPT for 3 months followed by aspirin alone -PT/OT recommending SNF -Aspirin/Plavix, Lipitor   Dysphagia -In setting of stroke. Failed swallow study and NG tube placed for feeding. Pulled out NG tube and does not want it reinserted. Patient amenable to PEG tube. MBS on 12/28 was  favorable and patient started on a Dysphagia 1 diet. -Continue SLP evaluation  OSA Acute on chronic respiratory failure with hypoxia and hypercapnia -Patient is s/p tracheostomy. Recently treated for RLL pneumonia and completed course of antibiotics. Chest x-ray obtained suggested moderately sized effusion. [place] thora ordered but with evidence of low amount of fluid. Lasix given  -Continue oxygen, wean as able  Acute metabolic encephalopathy -Waxing and wanes  -Stable this morning  Essential hypertension -Patient is on lisinopril and metoprolol as an outpatient. Held on admission for permissive hypertension. -Continue home metoprolol, lisinopril   Sepsis secondary to right lower lobe pneumonia -Secondary to pneumonia. Not present on admission. Resolved. -Patient empirically started on Vancomycin, Ceftriaxone and Flagyl. Completed course.  Abdominal pain -Unknown etiology. Associated distension. Abdominal x-ray unremarkable.  Hypernatremia -Iatrogenic in setting of normal saline. Resolved.   Positive blood culture -1/4 bottles positive with GPC now found to be coagulase negative staph species. Likely contamination. Vancomycin discontinued.  Chronic diastolic heart failure -Continue Digoxin  Diabetes mellitus, type 2 -Insulin dependent. Hemoglobin A1C of 8.3% -Continue Lantus and SSI   Lung nodule -Incidental finding. 65mm in size. Non-contrast CT in 12 months if high risk. Will need outpatient follow-up.  Goals of care -Palliative care consulted and after discussions with patient and family, patient will remain full code with full scope care.   DVT prophylaxis: Lovenox Code Status: Full Family Communication: None at bedside Disposition Plan: Pending SLP re-eval, wean O2, back to SNF eventually   Consultants:   Neurology  Palliative care medicine    Antimicrobials:  Anti-infectives (From admission, onward)   Start     Dose/Rate Route Frequency Ordered  Stop   02/22/19 1630  fluconazole (DIFLUCAN) IVPB 100 mg     100 mg 50 mL/hr over 60 Minutes Intravenous Every 24 hours 02/22/19 1619 02/28/19 1900   02/22/19 1400  metroNIDAZOLE (FLAGYL) IVPB 500 mg  Status:  Discontinued     500 mg 100 mL/hr over 60 Minutes Intravenous Every 8 hours 02/22/19 0936 02/28/19 1345   02/22/19 1400  cefTRIAXone (ROCEPHIN) 2 g in sodium chloride 0.9 % 100 mL IVPB  Status:  Discontinued     2 g 200 mL/hr over 30 Minutes Intravenous Every 24 hours 02/22/19 0936 02/28/19 1345   02/22/19 0200  vancomycin (VANCOCIN) IVPB 1000 mg/200 mL premix  Status:  Discontinued     1,000 mg 200 mL/hr over 60 Minutes Intravenous Every 12 hours 02/21/19 1314 02/25/19 1423   02/21/19 1400  piperacillin-tazobactam (ZOSYN) IVPB 3.375 g  Status:  Discontinued     3.375 g 100 mL/hr over 30 Minutes Intravenous Every 8 hours 02/21/19 1228 02/21/19 1251   02/21/19 1400  piperacillin-tazobactam (ZOSYN) IVPB 3.375 g  Status:  Discontinued     3.375 g 12.5 mL/hr over 240 Minutes Intravenous Every 8 hours 02/21/19 1252 02/22/19 0936   02/21/19 1400  vancomycin (VANCOREADY) IVPB 2000 mg/400 mL     2,000 mg 200 mL/hr over 120 Minutes Intravenous  Once 02/21/19 1314 02/21/19 1615       Objective: Vitals:   03/07/19 0455 03/07/19 0804 03/07/19 0834 03/07/19 0834  BP: 136/72  137/61 137/61  Pulse: 95 89 86 86  Resp: 18 18 20 20   Temp: 98.2 F (36.8 C)  98.3 F (36.8 C)   TempSrc: Oral  Axillary   SpO2: 94% 94% 93% 93%  Weight:      Height:        Intake/Output Summary (Last 24 hours) at 03/07/2019 0957 Last data filed at 03/06/2019 1117 Gross per 24 hour  Intake 10 ml  Output 350 ml  Net -340 ml   Filed Weights   03/05/19 0320 03/06/19 0311 03/07/19 0306  Weight: 120.5 kg 116.5 kg 114.4 kg    Examination: General exam: Appears calm and comfortable  Respiratory system: Clear to auscultation anteriorly. Respiratory effort normal.  Trach collar in place Cardiovascular system:  S1 & S2 heard, RRR. No pedal edema. Gastrointestinal system: Abdomen is nondistended, soft and nontender. Normal bowel sounds heard. Central nervous system: Alert Extremities: Symmetric in appearance bilaterally  Skin: No rashes, lesions or ulcers on exposed skin     Data Reviewed: I have personally reviewed following labs and imaging studies  CBC: Recent Labs  Lab 03/01/19 0457 03/04/19 0800 03/05/19 1124 03/06/19 0540 03/07/19 0236  WBC 12.9* 13.1* 12.8* 13.5* 11.2*  HGB 13.0 13.6 13.8 14.5 14.0  HCT 40.0 42.5 43.4 45.5 43.7  MCV 93.2 95.1 93.3 94.8 93.8  PLT 220 200 198 227 229   Basic Metabolic Panel: Recent Labs  Lab 03/01/19 0457 03/04/19 0842 03/05/19 1124 03/06/19 0540  NA 145 143 142 142  K 3.2* 3.7 4.0 3.8  CL 108 101 103 98  CO2 28 35* 34* 32  GLUCOSE 171* 159* 151* 122*  BUN 17 9 13 12   CREATININE 0.87 0.89 0.86 0.85  CALCIUM 8.5* 9.2 8.7* 8.9   GFR: Estimated Creatinine Clearance: 104.7 mL/min (by C-G formula based on SCr of 0.85 mg/dL). Liver Function Tests: Recent Labs  Lab 03/05/19 1124  AST 22  ALT 19  ALKPHOS 63  BILITOT 0.4  PROT 5.8*  ALBUMIN 1.8*   No results for input(s):  LIPASE, AMYLASE in the last 168 hours. No results for input(s): AMMONIA in the last 168 hours. Coagulation Profile: No results for input(s): INR, PROTIME in the last 168 hours. Cardiac Enzymes: No results for input(s): CKTOTAL, CKMB, CKMBINDEX, TROPONINI in the last 168 hours. BNP (last 3 results) No results for input(s): PROBNP in the last 8760 hours. HbA1C: No results for input(s): HGBA1C in the last 72 hours. CBG: Recent Labs  Lab 03/06/19 1633 03/06/19 2006 03/06/19 2358 03/07/19 0457 03/07/19 0830  GLUCAP 117* 89 90 100* 90   Lipid Profile: No results for input(s): CHOL, HDL, LDLCALC, TRIG, CHOLHDL, LDLDIRECT in the last 72 hours. Thyroid Function Tests: No results for input(s): TSH, T4TOTAL, FREET4, T3FREE, THYROIDAB in the last 72 hours. Anemia  Panel: No results for input(s): VITAMINB12, FOLATE, FERRITIN, TIBC, IRON, RETICCTPCT in the last 72 hours. Sepsis Labs: Recent Labs  Lab 03/05/19 1124 03/06/19 0540 03/07/19 0236  PROCALCITON 0.22 0.22 0.23    Recent Results (from the past 240 hour(s))  Culture, Urine     Status: None   Collection Time: 03/02/19  8:49 AM   Specimen: Urine, Catheterized  Result Value Ref Range Status   Specimen Description URINE, CATHETERIZED  Final   Special Requests NONE  Final   Culture   Final    NO GROWTH Performed at [facility], [address] [place], [place] [address]    Report Status 03/03/2019 FINAL  Final  Culture, respiratory     Status: None   Collection Time: 03/03/19  7:18 PM   Specimen: Tracheal Aspirate  Result Value Ref Range Status   Specimen Description TRACHEAL ASPIRATE  Final   Special Requests NONE  Final   Gram Stain   Final    FEW SQUAMOUS EPITHELIAL CELLS PRESENT ABUNDANT WBC PRESENT, PREDOMINANTLY PMN ABUNDANT GRAM POSITIVE RODS RARE GRAM POSITIVE COCCI Performed at [facility], [address], [place], [place] [address]    Culture ABUNDANT STAPHYLOCOCCUS EPIDERMIDIS  Final   Report Status 03/05/2019 FINAL  Final   Organism ID, Bacteria STAPHYLOCOCCUS EPIDERMIDIS  Final      Susceptibility   Staphylococcus epidermidis - MIC*    CIPROFLOXACIN 1 SENSITIVE Sensitive     ERYTHROMYCIN >=8 RESISTANT Resistant     GENTAMICIN <=0.5 SENSITIVE Sensitive     OXACILLIN >=4 RESISTANT Resistant     TETRACYCLINE >=16 RESISTANT Resistant     VANCOMYCIN 1 SENSITIVE Sensitive     TRIMETH/SULFA 160 RESISTANT Resistant     CLINDAMYCIN <=0.25 SENSITIVE Sensitive     RIFAMPIN <=0.5 SENSITIVE Sensitive     Inducible Clindamycin NEGATIVE Sensitive     * ABUNDANT STAPHYLOCOCCUS EPIDERMIDIS      Radiology Studies: DG CHEST PORT 1 VIEW  Result Date: 03/06/2019 CLINICAL DATA:  Aspiration into airway. EXAM: PORTABLE CHEST 1 VIEW COMPARISON:  Radiograph earlier  this day FINDINGS: Tracheostomy tube tip at the thoracic inlet. Similar hazy opacity at the right lung base likely combination of airspace disease and pleural fluid. Left lung appears clear. Unchanged heart size and mediastinal contours. No pneumothorax. Multiple overlying monitoring devices. IMPRESSION: 1. No significant change from earlier this day. Unchanged hazy opacity at the right lung base, likely combination of airspace disease and pleural fluid. 2. Tracheostomy tube tip at the thoracic inlet. Electronically Signed   By: Narda Rutherford M.D.   On: 03/06/2019 01:07      Scheduled Meds: .  stroke: mapping our early stages of recovery book   Does not  apply Once  . aspirin EC  325 mg Oral Daily  . atorvastatin  20 mg Oral q1800  . budesonide  0.5 mg Nebulization BID  . clopidogrel  75 mg Oral Daily  . digoxin  0.25 mg Oral Daily  . divalproex  500 mg Oral Q8H  . enoxaparin (LOVENOX) injection  60 mg Subcutaneous Q24H  . feeding supplement (ENSURE ENLIVE)  237 mL Oral BID BM  . insulin aspart  0-5 Units Subcutaneous QHS  . insulin aspart  0-9 Units Subcutaneous TID WC  . insulin glargine  10 Units Subcutaneous Daily  . ipratropium-albuterol  3 mL Nebulization BID  . lisinopril  10 mg Oral Daily  . metoprolol tartrate  12.5 mg Oral Q6H  . multivitamin with minerals  1 tablet Oral Daily  . Resource ThickenUp Clear   Oral Once  . sodium chloride flush  10-40 mL Intracatheter Q12H   Continuous Infusions: . sodium chloride Stopped (02/28/19 2112)     LOS: 16 days      Time spent: 20 minutes   Noralee Stain, DO [facility] 03/07/2019, 9:57 AM   Available via Epic secure chat 7am-7pm After these hours, please refer to coverage provider listed on [email or web address]

## 2019-03-07 NOTE — Progress Notes (Signed)
RN notified by tele about pt having a 2nd degree hear block as well has HR dropping into low 30s. RN witnessed on tele box x3. Pt asymptomatic. BP: 132/74 MD [facility]oi has been notified. RN awaiting orders.

## 2019-03-07 NOTE — Progress Notes (Signed)
  Speech Language Pathology Treatment: Dysphagia;Passy Muir Speaking valve  Patient Details Name: Tyrone Cunningham MRN: [id number] DOB: 27-Apr-1955 Today's Date: 03/07/2019 Time: 6144-3154 SLP Time Calculation (min) (ACUTE ONLY): 12 min  Assessment / Plan / Recommendation Clinical Impression  PMV Pt tolerated PMV for 12 minutes during PO trials with no change to heart rate or SpO2, or increased work of breathing noted.  RN reports adequate tolerance of PMV with medications earlier this morning. Pt reports discomfort all over, but did not specifically have complaints about discomfort or difficulty breathing with PMV in place.  RN reports needing to remove   SWALLOWING Pt consumed trials of thin and nectar thick liquids and purees. There was immediate cough x1 after one of several puree trials.  SLP assisted in clearance with use of oral suction.  There was intermittent wet vocal quality noted.  Pt had complained of difficulty swallowing and RN noted multiple swallows with AM medications.  Liquids and puree both given by spoon.  Pt exhibited difficulty retrieving bolus from cup.  There was poor labial closure to cup edge and anterior spillage noted.  Recommend repeat instrumental evaluation prior to restarting PO diet.  Pt may have floor stock snacks of puree and nectar thick liquid by teaspoon with staff supervision overnight, pending results of MBSS planned for next date.  Administer priority oral medications crushed with puree or nectar thick liquids.   HPI HPI: Tyrone Cunningham is a 64 y.o. male with medical history significant of CHF, diabetes, chronic pain syndrome, was brought to ED via EMS from [facility] nursing facility for evaluation of altered mental status. Per nursing home staff, pt appears to have right sided facial drooping, and right-sided weakness. 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  No previous ST notes in Epic.  Pt reported that he does not wear a PMV  and usually finger occludes or mouths words to communicate.  MBS 02/22/19 with recs for NPO.  Repeat MBS 12/28 much improved; started on dys1, thin liquids.  Pt had functional difficulty with thins at mealtime, so liquids were thickened to nectar and he appeared to tolerate much better.  Downgraded to NPO 1/3 following concern for difficulty with POs and poor tolerance of PMV with desaturation noted.      SLP Plan  Continue with current plan of care;MBS       Recommendations  Diet recommendations: NPO(May have floor snacks of puree and nectar thick liquid by teaspoon with staff, pending results of instrumental reassessment.) Liquids provided via: Teaspoon Medication Administration: Crushed with puree(or crushed with NTL) Supervision: Full supervision/cueing for compensatory strategies Compensations: Slow rate;Small sips/bites;Minimize environmental distractions Postural Changes and/or Swallow Maneuvers: Seated upright 90 degrees      Patient may use Passy-Muir Speech Valve: Intermittently with supervision PMSV Supervision: Full         Oral Care Recommendations: Oral care QID;Staff/trained caregiver to provide oral care Follow up Recommendations: Skilled Nursing facility SLP Visit Diagnosis: Dysphagia, oropharyngeal phase (R13.12) Plan: Continue with current plan of care;MBS       GO                Kerrie Pleasure, MA, CCC-SLP Acute Rehabilitation Services Office: [phone] 03/07/2019, 1:07 PM

## 2019-03-08 ENCOUNTER — Inpatient Hospital Stay (HOSPITAL_COMMUNITY): Payer: Medicare Other

## 2019-03-08 LAB — GLUCOSE, CAPILLARY
Glucose-Capillary: 92 mg/dL (ref 70–99)
Glucose-Capillary: 96 mg/dL (ref 70–99)
Glucose-Capillary: 104 mg/dL — ABNORMAL HIGH (ref 70–99)
Glucose-Capillary: 108 mg/dL — ABNORMAL HIGH (ref 70–99)
Glucose-Capillary: 117 mg/dL — ABNORMAL HIGH (ref 70–99)
Glucose-Capillary: 94 mg/dL (ref 70–99)
Glucose-Capillary: 104 mg/dL — ABNORMAL HIGH (ref 70–99)

## 2019-03-08 MED ORDER — DIGOXIN 125 MCG PO TABS
0.2500 mg | ORAL_TABLET | Freq: Every day | ORAL | Status: DC
  Administered 2019-03-08 – 2019-03-10 (×2): 0.25 mg via ORAL
  Filled 2019-03-08 (×4): qty 2

## 2019-03-08 MED ORDER — METOPROLOL TARTRATE 12.5 MG HALF TABLET
12.5000 mg | ORAL_TABLET | Freq: Two times a day (BID) | ORAL | Status: DC
  Administered 2019-03-08: 12.5 mg via ORAL
  Filled 2019-03-08 (×3): qty 1

## 2019-03-08 NOTE — Progress Notes (Signed)
Physical Therapy Treatment Patient Details Name: Tyrone Cunningham MRN: [id number] DOB: 19-Mar-1955 Today's Date: 03/08/2019    History of Present Illness Tyrone Cunningham is a 64 y.o. male with PMHx: chronic CHF, diabetes, chronic pain syndrome, chronic trach, was brought to ED via EMS from [facility] for AMS with R sided facial drooping, R-sided weakness. Pt is usually bedbound but can walk on occasion; Pt is trach dependent. 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 Incidental lung nodule found.    PT Comments    Pt supine in bed slumped to the R side.  Pt assisted to a seated position edge of bed with emphasis on maintaining midline and upper trunk and head control.  Pt continues to benefit from skilled placement at snf based on his need for +2 external assistance.  Plan next session to continued sitting edge of bed and position upright to better managed his secretions.      Follow Up Recommendations  SNF     Equipment Recommendations  None recommended by PT    Recommendations for Other Services       Precautions / Restrictions Precautions Precautions: Fall;Other (comment) Precaution Comments: trach dependent, PMV during PT Restrictions Weight Bearing Restrictions: No Other Position/Activity Restrictions: L posey mitt    Mobility  Bed Mobility Overal bed mobility: Needs Assistance   Rolling: Total assist;+2 for physical assistance Sidelying to sit: Total assist;+2 for physical assistance     Sit to sidelying: Total assist;+2 for physical assistance General bed mobility comments: Pt performed mobility with continued total +2 assistance.  His secretions appears better managed this session as he was suctioned pre tx.  He required max to total assistance to maintain sitting edge of bed,.  His O2 sats fluctuated on RW from 84%-94%.  Returned supplememtal O2 at end of session.  Transfers                 General transfer comment:  deferred, pt will need lift for safety and due to poor trunk control chair is not a good idea   Pt positioned in chair position in bed with pillows for propping.  Ambulation/Gait                 Stairs             Wheelchair Mobility    Modified Rankin (Stroke Patients Only)       Balance Overall balance assessment: Needs assistance Sitting-balance support: Feet unsupported;Single extremity supported Sitting balance-Leahy Scale: Zero Sitting balance - Comments: Balance very poor at times zero.  He would shift weight foward and presents with LOB in mutliple directions.                                    Cognition Arousal/Alertness: Awake/alert Behavior During Therapy: Restless Overall Cognitive Status: No family/caregiver present to determine baseline cognitive functioning                                 General Comments: Pt appears more confused and not following commands as well as he had been doing previously.  His sats improved with sitting up edge of bed.      Exercises      General Comments        Pertinent Vitals/Pain Pain Assessment: Faces Faces Pain Scale: Hurts  whole lot Pain Location: generalized, pt reports, "all over" Pain Descriptors / Indicators: Grimacing;Moaning Pain Intervention(s): Monitored during session;Repositioned    Home Living                      Prior Function            PT Goals (current goals can now be found in the care plan section) Acute Rehab PT Goals Patient Stated Goal: to get stronger on R side Potential to Achieve Goals: Fair Progress towards PT goals: Progressing toward goals    Frequency    Min 2X/week      PT Plan Current plan remains appropriate    Co-evaluation              AM-PAC PT "6 Clicks" Mobility   Outcome Measure  Help needed turning from your back to your side while in a flat bed without using bedrails?: Total Help needed moving from lying  on your back to sitting on the side of a flat bed without using bedrails?: Total Help needed moving to and from a bed to a chair (including a wheelchair)?: Total Help needed standing up from a chair using your arms (e.g., wheelchair or bedside chair)?: Total Help needed to walk in hospital room?: Total Help needed climbing 3-5 steps with a railing? : Total 6 Click Score: 6    End of Session Equipment Utilized During Treatment: Oxygen Activity Tolerance: Patient limited by fatigue;Patient limited by lethargy(restlessness) Patient left: in bed;with call bell/phone within reach;with bed alarm set Nurse Communication: Mobility status(nurse present during session to assist with mobility and monitored O2.) PT Visit Diagnosis: Hemiplegia and hemiparesis;Muscle weakness (generalized) (M62.81);Other abnormalities of gait and mobility (R26.89) Hemiplegia - Right/Left: Right Hemiplegia - dominant/non-dominant: Non-dominant     Time: 1660-6004 PT Time Calculation (min) (ACUTE ONLY): 18 min  Charges:  $Therapeutic Activity: 8-22 mins                     Tyrone Cunningham , PTA Acute Rehabilitation Services Pager [phone] Office [phone]     Tyrone Cunningham Artis Delay 03/08/2019, 11:11 AM

## 2019-03-08 NOTE — Progress Notes (Addendum)
PROGRESS NOTE    Tyrone Cunningham  VZD:[id number] DOB: [birth year] DOA: 02/19/2019 PCP: System, Pcp Not In     Brief Narrative:  Tyrone Cunningham is a 64 y.o.malewith past medical history significant ofchronic CHF, diabetes, chronic pain syndrome,chronic trach, wasbroughtto EDvia EMS from [facility] for evaluation of altered mental status.Per nursing home staff, pt appearsto have right sided facial drooping, right-sided weakness, andnot behaving himself this mornnig. Last known normal was day before admission. He is usually mostly bedbound but can walk on occasion. He is trach dependent. In the ED, head CT showed no acute changes and CTA showed no LVO. He was outside of the tPA window and not a candidate for thrombectomy because he did not have a large vessel occlusion.Neurology consulted. Hospitalization further complicated by aspiration and pneumonia.   New events last 24 hours / Subjective: Lying naked in bed, gown tossed aside. Had episode of second degree AV block yesterday evening, after metoprolol administration. Now sinus tachy this morning.   Assessment & Plan:   Active Problems:   Hypertension   Tracheostomy dependent ([place])   DM (diabetes mellitus), type 2 with neurological complications ([place])   Palliative care encounter   Stroke (cerebrum) (HCC)   Chronic diastolic CHF (congestive heart failure) (HCC)   Hemiparesis of right dominant side (HCC)   Aspiration pneumonia of left lung (HCC)   Aspiration into airway   Acute on chronic respiratory failure (HCC)    Acute stroke -With associated right sided hemiplegia and facial droop. 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 Resultant cortical blindness. -Neurology recommendations: DAPT for 3 months followed by aspirin alone -PT/OT recommending SNF -Aspirin/Plavix, Lipitor   Dysphagia -In setting of stroke. Failed swallow study and  NG tube placed for feeding. Pulled out NG tube and does not want it reinserted. Patient amenable to PEG tube. MBS on 12/28 was favorable and patient started on a Dysphagia 1 diet. -Continue SLP evaluation  OSA Acute on chronic respiratory failure with hypoxia and hypercapnia -Patient is s/p tracheostomy. Recently treated for RLL pneumonia and completed course of antibiotics. Chest x-ray obtained suggested moderately sized effusion. [place] thora ordered but with evidence of low amount of fluid. Lasix given  -Continue oxygen, wean as able  Acute metabolic encephalopathy -[place]   Essential hypertension -Patient is on lisinopril and metoprolol as an outpatient. Held on admission for permissive hypertension. -Continue home metoprolol, lisinopril   Sepsis secondary to right lower lobe pneumonia -Secondary to pneumonia. Not present on admission. Resolved. -Patient empirically started on Vancomycin, Ceftriaxone and Flagyl. Completed course.  Abdominal pain -Unknown etiology. Associated distension. Abdominal x-ray unremarkable.  Hypernatremia -Iatrogenic in setting of normal saline. Resolved.   Positive blood culture -1/4 bottles positive with GPC now found to be coagulase negative staph species. Likely contamination. Vancomycin discontinued.  Chronic diastolic heart failure -Continue Digoxin. Digoxin level normal limit.   Diabetes mellitus, type 2 -Insulin dependent. Hemoglobin A1C of 8.3% -Continue Lantus and SSI   Lung nodule -Incidental finding. 63mm in size. Non-contrast CT in 12 months if high risk. Will need outpatient follow-up.  Goals of care -Palliative care consulted and after discussions with patient and family, patient will remain full code with full scope care.   DVT prophylaxis: Lovenox Code Status: Full Family Communication: None at bedside; left voicemail for brother to discuss patient's status  Disposition Plan: Pending SLP re-eval, wean O2, back  to SNF eventually  Consultants:   Neurology  Palliative care medicine    Antimicrobials:  Anti-infectives (From admission, onward)   Start     Dose/Rate Route Frequency Ordered Stop   02/22/19 1630  fluconazole (DIFLUCAN) IVPB 100 mg     100 mg 50 mL/hr over 60 Minutes Intravenous Every 24 hours 02/22/19 1619 02/28/19 1900   02/22/19 1400  metroNIDAZOLE (FLAGYL) IVPB 500 mg  Status:  Discontinued     500 mg 100 mL/hr over 60 Minutes Intravenous Every 8 hours 02/22/19 0936 02/28/19 1345   02/22/19 1400  cefTRIAXone (ROCEPHIN) 2 g in sodium chloride 0.9 % 100 mL IVPB  Status:  Discontinued     2 g 200 mL/hr over 30 Minutes Intravenous Every 24 hours 02/22/19 0936 02/28/19 1345   02/22/19 0200  vancomycin (VANCOCIN) IVPB 1000 mg/200 mL premix  Status:  Discontinued     1,000 mg 200 mL/hr over 60 Minutes Intravenous Every 12 hours 02/21/19 1314 02/25/19 1423   02/21/19 1400  piperacillin-tazobactam (ZOSYN) IVPB 3.375 g  Status:  Discontinued     3.375 g 100 mL/hr over 30 Minutes Intravenous Every 8 hours 02/21/19 1228 02/21/19 1251   02/21/19 1400  piperacillin-tazobactam (ZOSYN) IVPB 3.375 g  Status:  Discontinued     3.375 g 12.5 mL/hr over 240 Minutes Intravenous Every 8 hours 02/21/19 1252 02/22/19 0936   02/21/19 1400  vancomycin (VANCOREADY) IVPB 2000 mg/400 mL     2,000 mg 200 mL/hr over 120 Minutes Intravenous  Once 02/21/19 1314 02/21/19 1615       Objective: Vitals:   03/08/19 0419 03/08/19 0437 03/08/19 0744 03/08/19 0816  BP:  (!) 144/91  114/70  Pulse:  (!) 107 (!) 107 99  Resp:  20 20 14   Temp:  99.2 F (37.3 C)  98.5 F (36.9 C)  TempSrc:  Oral  Oral  SpO2:  96% 91% 95%  Weight: 114 kg     Height:        Intake/Output Summary (Last 24 hours) at 03/08/2019 0916 Last data filed at 03/08/2019 0420 Gross per 24 hour  Intake --  Output 550 ml  Net -550 ml   Filed Weights   03/06/19 0311 03/07/19 0306 03/08/19 0419  Weight: 116.5 kg 114.4 kg 114 kg     Examination: General exam: Appears calm and comfortable  Respiratory system: Clear to auscultation anteriorly. Respiratory effort normal. Trach collar in place  Cardiovascular system: S1 & S2 heard, tachycardic, regular rhythm. No pedal edema. Gastrointestinal system: Abdomen is nondistended, soft and nontender. Normal bowel sounds heard. Central nervous system: Alert  Extremities: Symmetric in appearance bilaterally  Skin: No rashes, lesions or ulcers on exposed skin     Data Reviewed: I have personally reviewed following labs and imaging studies  CBC: Recent Labs  Lab 03/04/19 0800 03/05/19 1124 03/06/19 0540 03/07/19 0236  WBC 13.1* 12.8* 13.5* 11.2*  HGB 13.6 13.8 14.5 14.0  HCT 42.5 43.4 45.5 43.7  MCV 95.1 93.3 94.8 93.8  PLT 200 198 227 229   Basic Metabolic Panel: Recent Labs  Lab 03/04/19 0842 03/05/19 1124 03/06/19 0540  NA 143 142 142  K 3.7 4.0 3.8  CL 101 103 98  CO2 35* 34* 32  GLUCOSE 159* 151* 122*  BUN 9 13 12   CREATININE 0.89 0.86 0.85  CALCIUM 9.2 8.7* 8.9   GFR: Estimated Creatinine Clearance: 104.5 mL/min (by C-G formula based on SCr of 0.85 mg/dL). Liver Function Tests: Recent Labs  Lab 03/05/19 1124  AST 22  ALT 19  ALKPHOS 63  BILITOT 0.4  PROT 5.8*  ALBUMIN 1.8*   No results for input(s): LIPASE, AMYLASE in the last 168 hours. No results for input(s): AMMONIA in the last 168 hours. Coagulation Profile: No results for input(s): INR, PROTIME in the last 168 hours. Cardiac Enzymes: No results for input(s): CKTOTAL, CKMB, CKMBINDEX, TROPONINI in the last 168 hours. BNP (last 3 results) No results for input(s): PROBNP in the last 8760 hours. HbA1C: No results for input(s): HGBA1C in the last 72 hours. CBG: Recent Labs  Lab 03/07/19 1549 03/07/19 2007 03/07/19 2318 03/08/19 0438 03/08/19 0817  GLUCAP 116* 93 75 92 96   Lipid Profile: No results for input(s): CHOL, HDL, LDLCALC, TRIG, CHOLHDL, LDLDIRECT in the last 72  hours. Thyroid Function Tests: No results for input(s): TSH, T4TOTAL, FREET4, T3FREE, THYROIDAB in the last 72 hours. Anemia Panel: No results for input(s): VITAMINB12, FOLATE, FERRITIN, TIBC, IRON, RETICCTPCT in the last 72 hours. Sepsis Labs: Recent Labs  Lab 03/05/19 1124 03/06/19 0540 03/07/19 0236  PROCALCITON 0.22 0.22 0.23    Recent Results (from the past 240 hour(s))  Culture, Urine     Status: None   Collection Time: 03/02/19  8:49 AM   Specimen: Urine, Catheterized  Result Value Ref Range Status   Specimen Description URINE, CATHETERIZED  Final   Special Requests NONE  Final   Culture   Final    NO GROWTH Performed at [facility], [address], [place], [place] [address]    Report Status 03/03/2019 FINAL  Final  Culture, respiratory     Status: None   Collection Time: 03/03/19  7:18 PM   Specimen: Tracheal Aspirate  Result Value Ref Range Status   Specimen Description TRACHEAL ASPIRATE  Final   Special Requests NONE  Final   Gram Stain   Final    FEW SQUAMOUS EPITHELIAL CELLS PRESENT ABUNDANT WBC PRESENT, PREDOMINANTLY PMN ABUNDANT GRAM POSITIVE RODS RARE GRAM POSITIVE COCCI Performed at [facility], [address], [place], [place] [address]    Culture ABUNDANT STAPHYLOCOCCUS EPIDERMIDIS  Final   Report Status 03/05/2019 FINAL  Final   Organism ID, Bacteria STAPHYLOCOCCUS EPIDERMIDIS  Final      Susceptibility   Staphylococcus epidermidis - MIC*    CIPROFLOXACIN 1 SENSITIVE Sensitive     ERYTHROMYCIN >=8 RESISTANT Resistant     GENTAMICIN <=0.5 SENSITIVE Sensitive     OXACILLIN >=4 RESISTANT Resistant     TETRACYCLINE >=16 RESISTANT Resistant     VANCOMYCIN 1 SENSITIVE Sensitive     TRIMETH/SULFA 160 RESISTANT Resistant     CLINDAMYCIN <=0.25 SENSITIVE Sensitive     RIFAMPIN <=0.5 SENSITIVE Sensitive     Inducible Clindamycin NEGATIVE Sensitive     * ABUNDANT STAPHYLOCOCCUS EPIDERMIDIS      Radiology Studies: DG Abd Portable  1V  Result Date: 03/07/2019 CLINICAL DATA:  Abdominal pain. EXAM: PORTABLE ABDOMEN - 1 VIEW COMPARISON:  February 28, 2019 FINDINGS: The bowel gas pattern is normal. Radiopaque surgical clips are seen overlying the right upper quadrant. No radio-opaque calculi or other significant radiographic abnormality are seen. IMPRESSION: Negative. Electronically Signed   By: Aram Candela M.D.   On: 03/07/2019 19:22      Scheduled Meds: .  stroke: mapping our early stages of recovery book   Does not apply Once  . aspirin EC  325 mg Oral Daily  . atorvastatin  20 mg Oral q1800  . budesonide  0.5 mg Nebulization BID  . clopidogrel  75 mg Oral Daily  . digoxin  0.25 mg Oral Daily  . divalproex  500 mg Oral Q8H  . enoxaparin (LOVENOX) injection  60 mg Subcutaneous Q24H  . feeding supplement (ENSURE ENLIVE)  237 mL Oral BID BM  . insulin aspart  0-24 Units Subcutaneous Q4H  . insulin aspart  0-5 Units Subcutaneous QHS  . insulin glargine  10 Units Subcutaneous Daily  . ipratropium-albuterol  3 mL Nebulization BID  . lisinopril  10 mg Oral Daily  . metoprolol tartrate  12.5 mg Oral BID  . multivitamin with minerals  1 tablet Oral Daily  . Resource ThickenUp Clear   Oral Once  . sodium chloride flush  10-40 mL Intracatheter Q12H   Continuous Infusions: . sodium chloride Stopped (02/28/19 2112)     LOS: 17 days      Time spent: 20 minutes   Noralee Stain, DO [facility] 03/08/2019, 9:16 AM   Available via Epic secure chat 7am-7pm After these hours, please refer to coverage provider listed on [email or web address]

## 2019-03-08 NOTE — Progress Notes (Signed)
Pt conti to pulled gown, top sheet, and trach collar throughout the night.

## 2019-03-08 NOTE — Plan of Care (Signed)
  Problem: Education: Goal: Knowledge of General Education information will improve Description: Including pain rating scale, medication(s)/side effects and non-pharmacologic comfort measures Outcome: Progressing   Problem: Health Behavior/Discharge Planning: Goal: Ability to manage health-related needs will improve Outcome: Progressing   Problem: Clinical Measurements: Goal: Ability to maintain clinical measurements within normal limits will improve Outcome: Progressing Goal: Will remain free from infection Outcome: Progressing Goal: Diagnostic test results will improve Outcome: Progressing Goal: Respiratory complications will improve Outcome: Progressing Goal: Cardiovascular complication will be avoided Outcome: Progressing   Problem: Activity: Goal: Risk for activity intolerance will decrease Outcome: Progressing   Problem: Nutrition: Goal: Adequate nutrition will be maintained Outcome: Progressing   Problem: Coping: Goal: Level of anxiety will decrease Outcome: Progressing   Problem: Elimination: Goal: Will not experience complications related to bowel motility Outcome: Progressing Goal: Will not experience complications related to urinary retention Outcome: Progressing   Problem: Pain Managment: Goal: General experience of comfort will improve Outcome: Progressing   Problem: Safety: Goal: Ability to remain free from injury will improve Outcome: Progressing   Problem: Skin Integrity: Goal: Risk for impaired skin integrity will decrease Outcome: Progressing   Problem: Education: Goal: Knowledge of secondary prevention will improve Outcome: Progressing   Problem: Self-Care: Goal: Ability to participate in self-care as condition permits will improve Outcome: Progressing   Problem: Nutrition: Goal: Risk of aspiration will decrease Outcome: Progressing   Problem: Ischemic Stroke/TIA Tissue Perfusion: Goal: Complications of ischemic stroke/TIA will be  minimized Outcome: Progressing   Problem: Education: Goal: Knowledge of disease or condition will improve Outcome: Progressing Goal: Knowledge of secondary prevention will improve Outcome: Progressing Goal: Knowledge of patient specific risk factors addressed and post discharge goals established will improve Outcome: Progressing Goal: Individualized Educational Video(s) Outcome: Progressing   Problem: Coping: Goal: Will verbalize positive feelings about self Outcome: Progressing Goal: Will identify appropriate support needs Outcome: Progressing   Problem: Health Behavior/Discharge Planning: Goal: Ability to manage health-related needs will improve Outcome: Progressing   Problem: Self-Care: Goal: Ability to participate in self-care as condition permits will improve Outcome: Progressing Goal: Verbalization of feelings and concerns over difficulty with self-care will improve Outcome: Progressing Goal: Ability to communicate needs accurately will improve Outcome: Progressing   Problem: Nutrition: Goal: Risk of aspiration will decrease Outcome: Progressing   Problem: Ischemic Stroke/TIA Tissue Perfusion: Goal: Complications of ischemic stroke/TIA will be minimized Outcome: Progressing   Lillieanna Tuohy, BSN, RN 

## 2019-03-08 NOTE — Progress Notes (Signed)
Modified Barium Swallow Progress Note  Patient Details  Name: Tyrone Cunningham MRN: [id number] Date of Birth: [birth year]  Today's Date: 03/08/2019  Modified Barium Swallow completed.  Full report located under Chart Review in the Imaging Section.  Brief recommendations include the following:  Clinical Impression  Today's reassessment marks a decline in swallow function from MBSS on 12/28.  Study was completed with PMV in place for all bolus trials.  Pt presents with moderate-severe oropharyngeal dysphagia c/b lingual discoordination, premature spillage, delayed swallow initiation, decreased base of tongue retraction, incomplete laryngeal closure, reduced pharyngeal stripping wave, and diminished sensation.  These deficits resulted in penetration of all consistencies trialed which could not be cleared. Cued cough was weak and ineffective at clearing penetration when pt was able to follow commands.  There is suspected aspiration of thin liquid which was at least partially cleared by reflexive cough.  Pt had very poor positioning and visuzalization of pharynx, larynx, and trachea was impacted by body habitus and positioning.  Pt also had fair to poor participation in this study which prohibited trials of additional compensatory strategies and reduced the number of bolus trials. Based on pt's cognitive deficits and fluctuating mental stats, it is suspected that pt would not be able to consistently execute compensatory strategies. There was significant pharyngeal residue, with vallecular space filled and some spillover and unsensed penetration of residue.  Based on the results of this study, pt is at risk for aspiration with any PO diet.  Consider discussion around goals of care.  If agressive intervention is desired, recommend pt remain NPO with alternate means of nutrition, hydration, and medication. If oral feeding is pursued for comfort, with known risk of aspiration, consider puree and nectar thick liquids.    Swallow Evaluation Recommendations       SLP Diet Recommendations: (Will defer diet decision to team after discussion with family)   Liquid Administration via: Cup;Spoon   Medication Administration: Crushed with puree       Compensations: Slow rate;Small sips/bites;Minimize environmental distractions       Oral Care Recommendations: Oral care QID        Kerrie Pleasure, MA, CCC-SLP Acute Rehabilitation Services Office: [phone]  03/08/2019,12:57 PM

## 2019-03-08 NOTE — Progress Notes (Signed)
Nutrition Follow-up  DOCUMENTATION CODES:   Morbid obesity  INTERVENTION:  Diet advancement per MD and team as appropriate.   RD to continue to monitor.   NUTRITION DIAGNOSIS:   Inadequate oral intake related to inability to eat as evidenced by estimated needs; ongoing  GOAL:   Patient will meet greater than or equal to 90% of their needs; progressing  MONITOR:   PO intake, Supplement acceptance, Diet advancement, Labs, Weight trends, Skin, I & O's  REASON FOR ASSESSMENT:   Consult Enteral/tube feeding initiation and management  ASSESSMENT:   64 y.o. male with past medical history significant of chronic CHF, diabetes, chronic pain syndrome, chronic trach, was brought to ED via EMS from [facility] nursing facility for evaluation of altered mental status.CT head: Findings consistent with age related atrophy and chronic small vessel ischemia. Findings compatible with subacute extension of left PCA infarct. Chronic infarct right PCA territory unchanged. Chronic left PICA infarct unchanged. Negative for acute hemorrhage.  12/27- pulled out NGT 12/28- s/p MBSS- advanced to dysphagia 1 diet with thin liquids 1/3 - changed to NPO status for concern for aspiration and increased trach secretions  Per SLP, plan for MBS to evaluate pt swallow. RD to continue to monitor and order nutritional supplements as appropriate.   Labs and medications reviewed.   Diet Order:   Diet Order            Diet NPO time specified Except for: Sips with Meds  Diet effective now              EDUCATION NEEDS:   Not appropriate for education at this time  Skin:  Skin Assessment: Reviewed RN Assessment  Last BM:  1/4  Height:   Ht Readings from Last 1 Encounters:  02/19/19 5\' 7"  (1.702 m)    Weight:   Wt Readings from Last 1 Encounters:  03/08/19 114 kg    Ideal Body Weight:  67.27 kg  BMI:  Body mass index is 39.36 kg/m.  Estimated Nutritional Needs:   Kcal:   2000-2200  Protein:  105-125 grams  Fluid:  >/= 2 L/day    05/06/19, MS, RD, LDN Pager # [phone] After hours/ weekend pager # [phone]

## 2019-03-09 LAB — GLUCOSE, CAPILLARY
Glucose-Capillary: 75 mg/dL (ref 70–99)
Glucose-Capillary: 75 mg/dL (ref 70–99)
Glucose-Capillary: 64 mg/dL — ABNORMAL LOW (ref 70–99)
Glucose-Capillary: 133 mg/dL — ABNORMAL HIGH (ref 70–99)
Glucose-Capillary: 127 mg/dL — ABNORMAL HIGH (ref 70–99)
Glucose-Capillary: 79 mg/dL (ref 70–99)

## 2019-03-09 MED ORDER — ASPIRIN 300 MG RE SUPP
300.0000 mg | Freq: Every day | RECTAL | Status: DC
  Administered 2019-03-09 – 2019-03-11 (×3): 300 mg via RECTAL
  Filled 2019-03-09 (×3): qty 1

## 2019-03-09 MED ORDER — VALPROATE SODIUM 500 MG/5ML IV SOLN
500.0000 mg | Freq: Three times a day (TID) | INTRAVENOUS | Status: DC
  Administered 2019-03-09 – 2019-03-13 (×13): 500 mg via INTRAVENOUS
  Filled 2019-03-09 (×15): qty 5

## 2019-03-09 MED ORDER — DEXTROSE 50 % IV SOLN
INTRAVENOUS | Status: AC
  Administered 2019-03-09: 25 mL
  Filled 2019-03-09: qty 50

## 2019-03-09 NOTE — Plan of Care (Signed)
  Problem: Education: Goal: Knowledge of General Education information will improve Description: Including pain rating scale, medication(s)/side effects and non-pharmacologic comfort measures Outcome: Progressing   Problem: Health Behavior/Discharge Planning: Goal: Ability to manage health-related needs will improve Outcome: Progressing   Problem: Clinical Measurements: Goal: Ability to maintain clinical measurements within normal limits will improve Outcome: Progressing Goal: Will remain free from infection Outcome: Progressing Goal: Diagnostic test results will improve Outcome: Progressing Goal: Respiratory complications will improve Outcome: Progressing Goal: Cardiovascular complication will be avoided Outcome: Progressing   Problem: Activity: Goal: Risk for activity intolerance will decrease Outcome: Progressing   Problem: Nutrition: Goal: Adequate nutrition will be maintained Outcome: Progressing   Problem: Coping: Goal: Level of anxiety will decrease Outcome: Progressing   Problem: Elimination: Goal: Will not experience complications related to bowel motility Outcome: Progressing Goal: Will not experience complications related to urinary retention Outcome: Progressing   Problem: Pain Managment: Goal: General experience of comfort will improve Outcome: Progressing   Problem: Safety: Goal: Ability to remain free from injury will improve Outcome: Progressing   Problem: Skin Integrity: Goal: Risk for impaired skin integrity will decrease Outcome: Progressing   Problem: Education: Goal: Knowledge of secondary prevention will improve Outcome: Progressing   Problem: Self-Care: Goal: Ability to participate in self-care as condition permits will improve Outcome: Progressing   Problem: Nutrition: Goal: Risk of aspiration will decrease Outcome: Progressing   Problem: Ischemic Stroke/TIA Tissue Perfusion: Goal: Complications of ischemic stroke/TIA will be  minimized Outcome: Progressing   Problem: Education: Goal: Knowledge of disease or condition will improve Outcome: Progressing Goal: Knowledge of secondary prevention will improve Outcome: Progressing Goal: Knowledge of patient specific risk factors addressed and post discharge goals established will improve Outcome: Progressing Goal: Individualized Educational Video(s) Outcome: Progressing   Problem: Coping: Goal: Will verbalize positive feelings about self Outcome: Progressing Goal: Will identify appropriate support needs Outcome: Progressing   Problem: Health Behavior/Discharge Planning: Goal: Ability to manage health-related needs will improve Outcome: Progressing   Problem: Self-Care: Goal: Ability to participate in self-care as condition permits will improve Outcome: Progressing Goal: Verbalization of feelings and concerns over difficulty with self-care will improve Outcome: Progressing Goal: Ability to communicate needs accurately will improve Outcome: Progressing   Problem: Nutrition: Goal: Risk of aspiration will decrease Outcome: Progressing   Problem: Ischemic Stroke/TIA Tissue Perfusion: Goal: Complications of ischemic stroke/TIA will be minimized Outcome: Progressing   Lincoln Brigham, BSN, RN

## 2019-03-09 NOTE — Progress Notes (Signed)
RN attempted to administer medications. RN gave pt small amount of applesauce and pt immediately  began to cough violently. RN stopped administration and notified MD. MD advised to hold all PO medicine. Will continue to monitor.

## 2019-03-09 NOTE — Plan of Care (Signed)
  Problem: Activity: Goal: Risk for activity intolerance will decrease Outcome: Not Progressing   

## 2019-03-09 NOTE — Plan of Care (Signed)
  Problem: SLP Dysphagia Goals Goal: Patient will demonstrate readiness for PO's Description: Patient will demonstrate readiness for PO's and/or instrumental swallow study as evidenced by: Flowsheets (Taken 03/09/2019 1116) Patient will demonstrate readiness for PO's and/or instrumental swallow study as evidenced by: with mod assist Note: Downgraded d/t status change   Problem: SLP Language Goals Goal: Patient will communicate needs/wants with Flowsheets (Taken 03/09/2019 1116) Patient will communicate ____  needs/wants with: mod assist Note: Downgraded d/t status change Goal: Patient will follow commands during a functional ADL with Flowsheets (Taken 03/09/2019 1116) Patient will follow ____ commands during a functional ADL with ____:  1 step  mod assist Note: Downgraded d/t status change   Problem: SLP Language Goals Goal: Patient will follow commands during a functional ADL with Flowsheets (Taken 03/09/2019 1116) Patient will follow ____ commands during a functional ADL with ____:  1 step  mod assist Note: Downgraded d/t status change   Problem: SLP Language Goals Goal: Patient will follow commands during a functional ADL with Flowsheets (Taken 03/09/2019 1116) Patient will follow ____ commands during a functional ADL with ____:  1 step  mod assist Note: Downgraded d/t status change   Problem: SLP PMSV Goals Goal: Patient will utilize PMSV for minutes without signs Description: Patient will utilize PMSV for minutes without signs of distress or decline in vital signs with Flowsheets (Taken 03/09/2019 1116) Patient will utilize PMSV for ____ minutes without signs of distress or decline in vital signs with ____: min assist Note: Downgraded d/t status change

## 2019-03-09 NOTE — Progress Notes (Signed)
Per telemetry Rosey Bath) pt had 7 sec 2nd 2-1 block. Pt resting. Dr. Katherina Right informed.

## 2019-03-09 NOTE — Progress Notes (Signed)
PROGRESS NOTE    Tyrone Cunningham Cunningham  ZOX:[id number] DOB: 12/13/1955 DOA: 02/19/2019 PCP: System, Pcp Not In     Brief Narrative:  Tyrone Cunningham is a 64 y.o.malewith past medical history significant ofchronic CHF, diabetes, chronic pain syndrome,chronic trach, [facility] for evaluation of altered mental status.Per nursing home staff, pt appearsto have right sided facial drooping, right-sided weakness, andnot behaving himself this mornnig. Last known normal was day before admission. He is usually mostly bedbound but can walk on occasion. He is trach dependent. In the ED, head CT showed no acute changes and CTA showed no LVO. He was outside of the tPA window and not a candidate for thrombectomy because he did not have a large vessel occlusion.Neurology consulted. Hospitalization further complicated by aspiration and pneumonia. Currently NPO due to failed swallow eval. Palliative conversation ongoing.   New events last 24 hours / Subjective: No new issues overnight.  Failed modified barium swallow yesterday.  Remains n.p.o.  Assessment & Plan:   Principal Problem:   Stroke (cerebrum) (HCC) Active Problems:   Hypertension   Tracheostomy dependent (HCC)   DM (diabetes mellitus), type 2 with neurological complications (HCC)   Palliative care encounter   Chronic diastolic CHF (congestive heart failure) (HCC)   Hemiparesis of right dominant side (HCC)   Aspiration pneumonia of left lung (HCC)   Aspiration into airway   Acute on chronic respiratory failure (HCC)    Acute stroke -With associated right sided hemiplegia and facial droop. 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 Resultant cortical blindness. -Neurology recommendations: DAPT for 3 months followed by aspirin alone -PT/OT recommending SNF -Aspirin/Plavix, Lipitor   Dysphagia -In setting of stroke.  Failed swallow study and NG tube placed for feeding. Pulled out NG tube and does not want it reinserted. Patient amenable to PEG tube. MBS on 12/28 was favorable and patient started on a Dysphagia 1 diet.  But started aspirating again.  Repeat SLP evaluation and patient had failed modified barium swallow 1/5.  Now n.p.o. -Discussed over the phone with brother regarding PEG placement versus comfort feeds and hospice.  He will discuss with his other sibling and come back to us with a decision in the next 1 to 2 days.  OSA Acute on chronic respiratory failure with hypoxia and hypercapnia -Patient is s/p tracheostomy. Recently treated for RLL pneumonia and completed course of antibiotics. Chest x-ray obtained suggested moderately sized effusion. US thora ordered but with evidence of low amount of fluid. Lasix given  -Continue oxygen, wean as able  Acute metabolic encephalopathy -Waxing and wanes   Essential hypertension -Patient is on lisinopril and metoprolol as an outpatient. Held on admission for permissive hypertension. -Continue home metoprolol, lisinopril   Sepsis secondary to right lower lobe pneumonia -Sepsis secondary to pneumonia. Not present on admission. Resolved. -Patient empirically started on Vancomycin, Ceftriaxone and Flagyl. Completed course.  Abdominal pain -Unknown etiology. Associated distension. Abdominal x-ray unremarkable.  Hypernatremia -Iatrogenic in setting of normal saline. Resolved.   Positive blood culture -1/4 bottles positive with GPC now found to be coagulase negative staph species. Likely contamination. Vancomycin discontinued.  Chronic diastolic heart failure -Continue Digoxin. Digoxin level normal limit.   Diabetes mellitus, type 2 -Insulin dependent. Hemoglobin A1C of 8.3% -Continue Lantus and SSI   Lung nodule -Incidental finding. 4mm in size. Non-contrast CT in 12 months if high risk. Will need outpatient follow-up.  Goals of  care -Palliative care following  Second-degree heart block, intermittent -Decrease Lopressor dose   DVT prophylaxis: Lovenox Code Status: Full Family Communication: Discussed with brother over the phone Disposition Plan: Discussed with brother over the phone this morning regarding repeat SLP evaluation recommending alternative means of nutrition and remain n.p.o.  We discussed PEG tube, quality of life, comfort care and hospice.  He will discuss with his other siblings and get back to [place] in 1 to 2 days with a decision regarding PEG.   Consultants:   Neurology  Palliative care medicine    Antimicrobials:  Anti-infectives (From admission, onward)   Start     Dose/Rate Route Frequency Ordered Stop   02/22/19 1630  fluconazole (DIFLUCAN) IVPB 100 mg     100 mg 50 mL/hr over 60 Minutes Intravenous Every 24 hours 02/22/19 1619 02/28/19 1900   02/22/19 1400  metroNIDAZOLE (FLAGYL) IVPB 500 mg  Status:  Discontinued     500 mg 100 mL/hr over 60 Minutes Intravenous Every 8 hours 02/22/19 0936 02/28/19 1345   02/22/19 1400  cefTRIAXone (ROCEPHIN) 2 g in sodium chloride 0.9 % 100 mL IVPB  Status:  Discontinued     2 g 200 mL/hr over 30 Minutes Intravenous Every 24 hours 02/22/19 0936 02/28/19 1345   02/22/19 0200  vancomycin (VANCOCIN) IVPB 1000 mg/200 mL premix  Status:  Discontinued     1,000 mg 200 mL/hr over 60 Minutes Intravenous Every 12 hours 02/21/19 1314 02/25/19 1423   02/21/19 1400  piperacillin-tazobactam (ZOSYN) IVPB 3.375 g  Status:  Discontinued     3.375 g 100 mL/hr over 30 Minutes Intravenous Every 8 hours 02/21/19 1228 02/21/19 1251   02/21/19 1400  piperacillin-tazobactam (ZOSYN) IVPB 3.375 g  Status:  Discontinued     3.375 g 12.5 mL/hr over 240 Minutes Intravenous Every 8 hours 02/21/19 1252 02/22/19 0936   02/21/19 1400  vancomycin (VANCOREADY) IVPB 2000 mg/400 mL     2,000 mg 200 mL/hr over 120 Minutes Intravenous  Once 02/21/19 1314 02/21/19 1615        Objective: Vitals:   03/09/19 0331 03/09/19 0342 03/09/19 0748 03/09/19 0809  BP:    (!) 150/64  Pulse:  95 93 92  Resp:  18 18 14   Temp:    98.5 F (36.9 C)  TempSrc:    Oral  SpO2:  96% 93% 92%  Weight: 114.5 kg     Height:       No intake or output data in the 24 hours ending 03/09/19 0904 Filed Weights   03/07/19 0306 03/08/19 0419 03/09/19 0331  Weight: 114.4 kg 114 kg 114.5 kg    Examination: General exam: Appears calm and comfortable  Respiratory system: Clear to auscultation anteriorly.  Respiratory effort normal Cardiovascular system: S1 & S2 heard, RRR. No pedal edema. Gastrointestinal system: Abdomen is nondistended, soft and nontender. Normal bowel sounds heard. Central nervous system: Alert but does not interact with me in a meaningful way Extremities: Symmetric in appearance bilaterally  Skin: No rashes, lesions or ulcers on exposed skin     Data Reviewed: I have personally reviewed following labs and imaging studies  CBC: Recent Labs  Lab 03/04/19 0800 03/05/19 1124 03/06/19 0540 03/07/19 0236  WBC 13.1* 12.8* 13.5* 11.2*  HGB 13.6 13.8 14.5 14.0  HCT 42.5 43.4 45.5 43.7  MCV 95.1 93.3 94.8 93.8  PLT 200 198 227 400   Basic Metabolic Panel: Recent Labs  Lab 03/04/19 0842 03/05/19 1124 03/06/19 0540  NA  143 142 142  K 3.7 4.0 3.8  CL 101 103 98  CO2 35* 34* 32  GLUCOSE 159* 151* 122*  BUN 9 13 12   CREATININE 0.89 0.86 0.85  CALCIUM 9.2 8.7* 8.9   GFR: Estimated Creatinine Clearance: 104.8 mL/min (by C-G formula based on SCr of 0.85 mg/dL). Liver Function Tests: Recent Labs  Lab 03/05/19 1124  AST 22  ALT 19  ALKPHOS 63  BILITOT 0.4  PROT 5.8*  ALBUMIN 1.8*   No results for input(s): LIPASE, AMYLASE in the last 168 hours. No results for input(s): AMMONIA in the last 168 hours. Coagulation Profile: No results for input(s): INR, PROTIME in the last 168 hours. Cardiac Enzymes: No results for input(s): CKTOTAL, CKMB,  CKMBINDEX, TROPONINI in the last 168 hours. BNP (last 3 results) No results for input(s): PROBNP in the last 8760 hours. HbA1C: No results for input(s): HGBA1C in the last 72 hours. CBG: Recent Labs  Lab 03/08/19 1555 03/08/19 1938 03/08/19 2327 03/09/19 0315 03/09/19 0810  GLUCAP 117* 94 104* 75 75   Lipid Profile: No results for input(s): CHOL, HDL, LDLCALC, TRIG, CHOLHDL, LDLDIRECT in the last 72 hours. Thyroid Function Tests: No results for input(s): TSH, T4TOTAL, FREET4, T3FREE, THYROIDAB in the last 72 hours. Anemia Panel: No results for input(s): VITAMINB12, FOLATE, FERRITIN, TIBC, IRON, RETICCTPCT in the last 72 hours. Sepsis Labs: Recent Labs  Lab 03/05/19 1124 03/06/19 0540 03/07/19 0236  PROCALCITON 0.22 0.22 0.23    Recent Results (from the past 240 hour(s))  Culture, Urine     Status: None   Collection Time: 03/02/19  8:49 AM   Specimen: Urine, Catheterized  Result Value Ref Range Status   Specimen Description URINE, CATHETERIZED  Final   Special Requests NONE  Final   Culture   Final    NO GROWTH Performed at [facility], [address], [place], [place]    Report Status 03/03/2019 FINAL  Final  Culture, respiratory     Status: None   Collection Time: 03/03/19  7:18 PM   Specimen: Tracheal Aspirate  Result Value Ref Range Status   Specimen Description TRACHEAL ASPIRATE  Final   Special Requests NONE  Final   Gram Stain   Final    FEW SQUAMOUS EPITHELIAL CELLS PRESENT ABUNDANT WBC PRESENT, PREDOMINANTLY PMN ABUNDANT GRAM POSITIVE RODS RARE GRAM POSITIVE COCCI Performed at [facility], [address], [place], [place]    Culture ABUNDANT STAPHYLOCOCCUS EPIDERMIDIS  Final   Report Status 03/05/2019 FINAL  Final   Organism ID, Bacteria STAPHYLOCOCCUS EPIDERMIDIS  Final      Susceptibility   Staphylococcus epidermidis - MIC*    CIPROFLOXACIN 1 SENSITIVE Sensitive     ERYTHROMYCIN >=8 RESISTANT Resistant      GENTAMICIN <=0.5 SENSITIVE Sensitive     OXACILLIN >=4 RESISTANT Resistant     TETRACYCLINE >=16 RESISTANT Resistant     VANCOMYCIN 1 SENSITIVE Sensitive     TRIMETH/SULFA 160 RESISTANT Resistant     CLINDAMYCIN <=0.25 SENSITIVE Sensitive     RIFAMPIN <=0.5 SENSITIVE Sensitive     Inducible Clindamycin NEGATIVE Sensitive     * ABUNDANT STAPHYLOCOCCUS EPIDERMIDIS      Radiology Studies: DG Abd Portable 1V  Result Date: 03/07/2019 CLINICAL DATA:  Abdominal pain. EXAM: PORTABLE ABDOMEN - 1 VIEW COMPARISON:  February 28, 2019 FINDINGS: The bowel gas pattern is normal. Radiopaque surgical clips are seen overlying the right upper quadrant. No radio-opaque calculi or other  significant radiographic abnormality are seen. IMPRESSION: Negative. Electronically Signed   By: Aram Candela M.D.   On: 03/07/2019 19:22   DG Swallowing Func-Speech Pathology  Result Date: 03/08/2019 Objective Swallowing Evaluation: Type of Study: MBS-Modified Barium Swallow Study  Patient Details Name: Brixton Franko MRN: [id number] Date of Birth: 1955/06/09 Today's Date: 03/08/2019 Time: SLP Start Time (ACUTE ONLY): 1129 -SLP Stop Time (ACUTE ONLY): 1153 SLP Time Calculation (min) (ACUTE ONLY): 24 min Past Medical History: Past Medical History: Diagnosis Date . Bronchitis  . CHF (congestive heart failure) (HCC)   grade 1 diastolic with preserved EF in 4287 . COPD (chronic obstructive pulmonary disease) (HCC)  . Diabetes mellitus without complication (HCC)  . DM type 2 with diabetic peripheral neuropathy (HCC)  . Hearing loss  . Hypercholesteremia  . Hypertension  . Peripheral neuropathy  . Sleep apnea   Severe sleep apnea requiring tracheostomy Past Surgical History: Past Surgical History: Procedure Laterality Date . APPENDECTOMY   . CHOLECYSTECTOMY N/A 01/05/2014  Procedure: LAPAROSCOPIC CHOLECYSTECTOMY;  Surgeon: Emelia Loron, MD;  Location: [facility] ORS;  Service: General;  Laterality: N/A; . FEMUR HARDWARE REMOVAL  09/01/2003  Removal of  retained hardware, two interlocking distal femoral . FEMUR SURGERY   . HAND SURGERY   . MULTIPLE EXTRACTIONS WITH ALVEOLOPLASTY N/A 05/11/2012  Procedure: MULTIPLE EXTRACTION WITH ALVEOLOPLASTY;  Surgeon: [name], DDS;  Location: [facility] OR;  Service: Oral Surgery;  Laterality: N/A; . TRACHEOSTOMY   HPI: Rueben Kassim is a 64 y.o. male with medical history significant of CHF, diabetes, chronic pain syndrome, was brought to ED via EMS from [facility] nursing facility for evaluation of altered mental status. Per nursing home staff, pt appears to have right sided facial drooping, and right-sided weakness. 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  No previous ST notes in Epic.  Pt reported that he does not wear a PMV and usually finger occludes or mouths words to communicate.  MBS 02/22/19 with recs for NPO.  Repeat MBS 12/28 much improved; started on dys1, thin liquids.  Pt had functional difficulty with thins at mealtime, so liquids were thickened to nectar and he appeared to tolerate much better.  Pt made NPO 1/3 after status change  Subjective: awake, alert, poor positioning, fair participation Assessment / Plan / Recommendation CHL IP CLINICAL IMPRESSIONS 03/08/2019 Clinical Impression Today's reassessment marks a decline in swallow function from MBSS on 12/28.  Study was completed with PMV in place for all bolus trials.  Pt presents with moderate-severe oropharyngeal dysphagia c/b lingual discoordination, premature spillage, delayed swallow initiation, decreased base of tongue retraction, incomplete laryngeal closure, reduced pharyngeal stripping wave, and diminished sensation.  These deficits resulted in penetration of all consistencies trialed which could not be cleared. Cued cough was weak and ineffective at clearing penetration when pt was able to follow commands.  There is suspected aspiration of thin liquid which was at least partially cleared by reflexive cough.  Pt had  very poor positioning and visuzalization of pharynx, larynx, and trachea was impacted by body habitus and positioning.  Pt also had fair to poor participation in this study which prohibited trials of additional compensatory strategies and reduced the number of bolus trials. Based on pt's cognitive deficits and fluctuating mental stats, it is suspected that pt would not be able to consistently execute compensatory strategies. There was significant pharyngeal residue, with vallecular space filled and some spillover and unsensed penetration of residue.  Based on the results of this  study, pt is at risk for aspiration with any PO diet.  Consider discussion around goals of care.  If agressive intervention is desired, recommend pt remain NPO with alternate means of nutrition, hydration, and medication. If oral feeding is pursued for comfort, with known risk of aspiration, consider puree and nectar thick liquids. SLP Visit Diagnosis -- Attention and concentration deficit following -- Frontal lobe and executive function deficit following -- Impact on safety and function --   CHL IP TREATMENT RECOMMENDATION 03/08/2019 Treatment Recommendations Therapy as outlined in treatment plan below   Prognosis 03/08/2019 Prognosis for Safe Diet Advancement Fair Barriers to Reach Goals Cognitive deficits;Language deficits;Severity of deficits Barriers/Prognosis Comment -- CHL IP DIET RECOMMENDATION 03/08/2019 SLP Diet Recommendations (No Data) Liquid Administration via Cup;Spoon Medication Administration Crushed with puree Compensations Slow rate;Small sips/bites;Minimize environmental distractions Postural Changes --   CHL IP OTHER RECOMMENDATIONS 03/08/2019 Recommended Consults -- Oral Care Recommendations Oral care QID Other Recommendations --   CHL IP FOLLOW UP RECOMMENDATIONS 03/08/2019 Follow up Recommendations Skilled Nursing facility   [facility] IP FREQUENCY AND DURATION 03/08/2019 Speech Therapy Frequency (ACUTE ONLY) min 2x/week Treatment  Duration 2 weeks      CHL IP ORAL PHASE 03/08/2019 Oral Phase Impaired Oral - Pudding Teaspoon -- Oral - Pudding Cup -- Oral - Honey Teaspoon -- Oral - Honey Cup Right anterior bolus loss;Weak lingual manipulation;Holding of bolus;Reduced posterior propulsion;Piecemeal swallowing;Decreased bolus cohesion;Premature spillage Oral - Nectar Teaspoon Weak lingual manipulation;Reduced posterior propulsion;Premature spillage Oral - Nectar Cup Right anterior bolus loss;Weak lingual manipulation;Reduced posterior propulsion;Premature spillage;Decreased bolus cohesion Oral - Nectar Straw -- Oral - Thin Teaspoon -- Oral - Thin Cup Right anterior bolus loss;Weak lingual manipulation;Piecemeal swallowing;Decreased bolus cohesion;Premature spillage Oral - Thin Straw -- Oral - Puree Weak lingual manipulation;Reduced posterior propulsion;Delayed oral transit;Decreased bolus cohesion;Premature spillage;Lingual/palatal residue Oral - Mech Soft -- Oral - Regular -- Oral - Multi-Consistency -- Oral - Pill -- Oral Phase - Comment --  CHL IP PHARYNGEAL PHASE 03/08/2019 Pharyngeal Phase WFL Pharyngeal- Pudding Teaspoon -- Pharyngeal -- Pharyngeal- Pudding Cup -- Pharyngeal -- Pharyngeal- Honey Teaspoon -- Pharyngeal -- Pharyngeal- Honey Cup Delayed swallow initiation-vallecula;Reduced pharyngeal peristalsis;Reduced tongue base retraction;Reduced airway/laryngeal closure;Penetration/Aspiration before swallow;Penetration/Aspiration during swallow;Pharyngeal residue - valleculae Pharyngeal Material enters airway, remains ABOVE vocal cords and not ejected out Pharyngeal- Nectar Teaspoon -- Pharyngeal -- Pharyngeal- Nectar Cup Delayed swallow initiation-vallecula;Reduced pharyngeal peristalsis;Reduced tongue base retraction;Reduced airway/laryngeal closure;Penetration/Aspiration before swallow;Penetration/Aspiration during swallow;Pharyngeal residue - valleculae Pharyngeal Material enters airway, remains ABOVE vocal cords and not ejected out  Pharyngeal- Nectar Straw -- Pharyngeal -- Pharyngeal- Thin Teaspoon -- Pharyngeal -- Pharyngeal- Thin Cup Delayed swallow initiation-pyriform sinuses;Reduced anterior laryngeal mobility;Reduced laryngeal elevation;Reduced airway/laryngeal closure;Reduced tongue base retraction;Penetration/Aspiration before swallow;Penetration/Aspiration during swallow;Moderate aspiration;Pharyngeal residue - pyriform Pharyngeal Material enters airway, passes BELOW cords and not ejected out despite cough attempt by patient Pharyngeal- Thin Straw -- Pharyngeal -- Pharyngeal- Puree Delayed swallow initiation-vallecula;Reduced pharyngeal peristalsis;Reduced tongue base retraction;Penetration/Aspiration before swallow;Penetration/Aspiration during swallow;Penetration/Apiration after swallow;Pharyngeal residue - valleculae Pharyngeal Material enters airway, remains ABOVE vocal cords and not ejected out Pharyngeal- Mechanical Soft -- Pharyngeal -- Pharyngeal- Regular -- Pharyngeal -- Pharyngeal- Multi-consistency -- Pharyngeal -- Pharyngeal- Pill -- Pharyngeal -- Pharyngeal Comment --  No flowsheet data found. Kerrie Pleasure, MA, CCC-SLP Acute Rehabilitation Services Office: [phone] 03/08/2019, 12:58 PM                 Scheduled Meds: .  stroke: mapping our early stages of recovery book   Does not apply Once  . aspirin  EC  325 mg Oral Daily  . atorvastatin  20 mg Oral q1800  . budesonide  0.5 mg Nebulization BID  . clopidogrel  75 mg Oral Daily  . digoxin  0.25 mg Oral Daily  . divalproex  500 mg Oral Q8H  . enoxaparin (LOVENOX) injection  60 mg Subcutaneous Q24H  . feeding supplement (ENSURE ENLIVE)  237 mL Oral BID BM  . insulin aspart  0-24 Units Subcutaneous Q4H  . insulin aspart  0-5 Units Subcutaneous QHS  . insulin glargine  10 Units Subcutaneous Daily  . ipratropium-albuterol  3 mL Nebulization BID  . lisinopril  10 mg Oral Daily  . metoprolol tartrate  12.5 mg Oral BID  . multivitamin with minerals  1 tablet  Oral Daily  . Resource ThickenUp Clear   Oral Once  . sodium chloride flush  10-40 mL Intracatheter Q12H   Continuous Infusions: . sodium chloride Stopped (02/28/19 2112)     LOS: 18 days      Time spent: 30 minutes   Noralee StainJennifer Gilford Lardizabal, DO [facility] 03/09/2019, 9:04 AM   Available via Epic secure chat 7am-7pm After these hours, please refer to coverage provider listed on [email or web address]

## 2019-03-09 NOTE — Progress Notes (Signed)
  Speech Language Pathology Treatment: Dysphagia  Patient Details Name: Tyrone Cunningham MRN: [id number] DOB: Feb 16, 1956 Today's Date: 03/09/2019 Time: 4403-4742 SLP Time Calculation (min) (ACUTE ONLY): 16 min  Assessment / Plan / Recommendation Clinical Impression  Skilled treatment session focused on dysphagia goals. SLP recieved pt in bed, leaning to left, PMSV off with trach collar in place and mittens. Pt with eyes open but was not responsive to any verbal stimulation or to repositioning in bed, Pt with very minimal flinch with sternal rub. SLP provided oral care via suction that resulted in put biting toothbrush with no ability to follow directions to open his mouth. SLP able to get out of mouth after several seconds. PO trials were not attempted d/t continued severe deficits in mentation and concern that boluses may not be able to be suctioned out d/t pt clinching on toothbrush. PMSV also not donned d/t pt's level of lethargy. Based on pt's continued decreased mentation and high risk of aspiration continue to recommend NPO with alternate means of nutrition, hydration and medication. If oral feeding is pursued for comfort, with known risk of aspiraiton, consider puree and nectar thick liquids (per MBS report on 03/08/19).   Nursing made aware of pt's session and continued recommendation for NPO. Nursing with questions regarding medication administration. Recommend nursing provide oral care and provide trial tsp of applesauce and assess if pt is able to effectively transit bolus and then trial applesauce crushed in puree. Have suction available. ST to continue to follow for improvements in mentation and PO readiness.    HPI HPI: Tyrone Cunningham is a 64 y.o. male with medical history significant of CHF, diabetes, chronic pain syndrome, was brought to ED via EMS from [facility] nursing facility for evaluation of altered mental status. Per nursing home staff, pt appears to have right sided facial drooping, and  right-sided weakness. 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  No previous ST notes in Epic.  Pt reported that he does not wear a PMV and usually finger occludes or mouths words to communicate.  MBS 02/22/19 with recs for NPO.  Repeat MBS 12/28 much improved; started on dys1, thin liquids.  Pt had functional difficulty with thins at mealtime, so liquids were thickened to nectar and he appeared to tolerate much better.  Pt made NPO 1/3 after status change      SLP Plan  Continue with current plan of care;MBS       Recommendations  Diet recommendations: NPO Medication Administration: Crushed with puree Supervision: Full supervision/cueing for compensatory strategies Compensations: Slow rate;Small sips/bites;Minimize environmental distractions Postural Changes and/or Swallow Maneuvers: Seated upright 90 degrees      Patient may use Passy-Muir Speech Valve: Intermittently with supervision PMSV Supervision: Full         Oral Care Recommendations: Oral care QID;Staff/trained caregiver to provide oral care Follow up Recommendations: Skilled Nursing facility SLP Visit Diagnosis: Dysphagia, oropharyngeal phase (R13.12) Plan: Continue with current plan of care;MBS       GO                Tyrone Cunningham 03/09/2019, 11:00 AM

## 2019-03-09 NOTE — Progress Notes (Signed)
RN informed this RT that patient had a desat period earlier.  They moved his O2 from 8L to 10L.  I informed RN that she also had to move the trach collar FIO2 measurement if she moved the liter flow.  I showed RN what to do.  Patient is now at 10L, 40% FIO2 at this time.  O2 sat is now 94%.  RN stated patient had been in upper 80s.  No distress noted at this time, will continue to monitor.

## 2019-03-10 ENCOUNTER — Inpatient Hospital Stay (HOSPITAL_COMMUNITY): Payer: Medicare Other

## 2019-03-10 LAB — BASIC METABOLIC PANEL
Anion gap: 12 (ref 5–15)
BUN: 22 mg/dL (ref 8–23)
CO2: 34 mmol/L — ABNORMAL HIGH (ref 22–32)
Calcium: 8.2 mg/dL — ABNORMAL LOW (ref 8.9–10.3)
Chloride: 97 mmol/L — ABNORMAL LOW (ref 98–111)
Creatinine, Ser: 1.2 mg/dL (ref 0.61–1.24)
GFR calc Af Amer: >60 mL/min (ref >60)
GFR calc non Af Amer: >60 mL/min (ref >60)
Glucose, Bld: 83 mg/dL (ref 70–99)
Potassium: 4.3 mmol/L (ref 3.5–5.1)
Sodium: 143 mmol/L (ref 135–145)

## 2019-03-10 LAB — GLUCOSE, CAPILLARY
Glucose-Capillary: 121 mg/dL — ABNORMAL HIGH (ref 70–99)
Glucose-Capillary: 62 mg/dL — ABNORMAL LOW (ref 70–99)
Glucose-Capillary: 75 mg/dL (ref 70–99)
Glucose-Capillary: 76 mg/dL (ref 70–99)
Glucose-Capillary: 77 mg/dL (ref 70–99)
Glucose-Capillary: 90 mg/dL (ref 70–99)
Glucose-Capillary: 94 mg/dL (ref 70–99)
Glucose-Capillary: 97 mg/dL (ref 70–99)
Glucose-Capillary: 98 mg/dL (ref 70–99)

## 2019-03-10 LAB — CBC
HCT: 45.1 % (ref 39.0–52.0)
Hemoglobin: 14.5 g/dL (ref 13.0–17.0)
MCH: 30.2 pg (ref 26.0–34.0)
MCHC: 32.2 g/dL (ref 30.0–36.0)
MCV: 94 fL (ref 80.0–100.0)
Platelets: 240 10*3/uL (ref 150–400)
RBC: 4.8 MIL/uL (ref 4.22–5.81)
RDW: 13.4 % (ref 11.5–15.5)
WBC: 9.9 10*3/uL (ref 4.0–10.5)
nRBC: 0.2 % (ref 0.0–0.2)

## 2019-03-10 LAB — MAGNESIUM: Magnesium: 2.2 mg/dL (ref 1.7–2.4)

## 2019-03-10 MED ORDER — DEXTROSE 50 % IV SOLN
INTRAVENOUS | Status: AC
  Administered 2019-03-10: 25 mL
  Filled 2019-03-10: qty 50

## 2019-03-10 MED ORDER — DEXTROSE 10 % IV SOLN: INTRAVENOUS | Status: DC

## 2019-03-10 NOTE — Progress Notes (Signed)
RN was feeding PT ice cream with his dinner meal and pt starting coughing and brown chocolate ice cream starting oozing out of his trach. RN immediately  stopped feeding pt and suction pt, and ice cream was coming from inside pt's trach. RN called respiratory to assist. MD was called and stated he would consult PCCM. Awaiting orders.

## 2019-03-10 NOTE — Progress Notes (Signed)
Physical Therapy Treatment Patient Details Name: Tyrone Cunningham MRN: [id number] DOB: 1955-04-19 Today's Date: 03/10/2019    History of Present Illness Tyrone Cunningham is a 64 y.o. male with PMHx: chronic CHF, diabetes, chronic pain syndrome, chronic trach, was brought to ED via EMS from [facility] for AMS with R sided facial drooping, R-sided weakness. Pt is usually bedbound but can walk on occasion; Pt is trach dependent. 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 Incidental lung nodule found.    PT Comments    Pt performed rolling to position patient into maximove sling.  He required assistance to move OOB via mechanical lift to recliner chair.  He was able to sit in recliner x 28 min before returning back to bed.  He continues to benefit from return to SNF at this time.    Follow Up Recommendations  SNF     Equipment Recommendations  None recommended by PT    Recommendations for Other Services       Precautions / Restrictions Precautions Precautions: Fall;Other (comment) Precaution Comments: trach dependent, PMV Restrictions Weight Bearing Restrictions: No Other Position/Activity Restrictions: L posey mitt    Mobility  Bed Mobility Overal bed mobility: Needs Assistance Bed Mobility: Rolling Rolling: Total assist;+2 for physical assistance   Supine to sit: Max assist Sit to supine: Total assist   General bed mobility comments: Total +2 to place bed pads and maximove sling under patient.  He required +2 assistance for positioning to place pads.  Performed long sitting in bed with +2 total assistance,  Transfers Overall transfer level: Needs assistance Equipment used: (maximove from bed to recliner.)             General transfer comment: Total +2 via mechanical lift.  Pt sat in recliner chair for 28 min before returning to bed.  Ambulation/Gait             General Gait Details: unable to ambulate   Stairs              Wheelchair Mobility    Modified Rankin (Stroke Patients Only)       Balance Overall balance assessment: Needs assistance Sitting-balance support: Feet unsupported;Single extremity supported Sitting balance-Leahy Scale: Poor Sitting balance - Comments: required mod A for balance and support as well steadying on R side with bed rail     Standing balance-Leahy Scale: Zero Standing balance comment: unable                            Cognition Arousal/Alertness: Awake/alert Behavior During Therapy: Restless Overall Cognitive Status: No family/caregiver present to determine baseline cognitive functioning                                 General Comments: Pt more lethargic this pm.  He performed rolling and supine to long sitting during session.      Exercises Other Exercises Other Exercises: PROM of R UE in multiple planes, then positioned in elevation    General Comments        Pertinent Vitals/Pain Pain Assessment: Faces Faces Pain Scale: Hurts little more Pain Location: generalized, movement of R LE Pain Descriptors / Indicators: Grimacing;Moaning Pain Intervention(s): Monitored during session;Repositioned    Home Living Family/patient expects to be discharged to:: Skilled nursing facility     Type of Home: Skilled Nursing Facility  Prior Function            PT Goals (current goals can now be found in the care plan section) Acute Rehab PT Goals Patient Stated Goal: none stated Potential to Achieve Goals: Fair Progress towards PT goals: Progressing toward goals    Frequency    Min 2X/week      PT Plan Current plan remains appropriate    Co-evaluation              AM-PAC PT "6 Clicks" Mobility   Outcome Measure  Help needed turning from your back to your side while in a flat bed without using bedrails?: Total Help needed moving from lying on your back to sitting on the side of a flat bed without  using bedrails?: Total Help needed moving to and from a bed to a chair (including a wheelchair)?: Total Help needed standing up from a chair using your arms (e.g., wheelchair or bedside chair)?: Total Help needed to walk in hospital room?: Total Help needed climbing 3-5 steps with a railing? : Total 6 Click Score: 6    End of Session Equipment Utilized During Treatment: Oxygen Activity Tolerance: Patient limited by fatigue;Patient limited by lethargy Patient left: in bed;with call bell/phone within reach;with bed alarm set Nurse Communication: Mobility status PT Visit Diagnosis: Hemiplegia and hemiparesis;Muscle weakness (generalized) (M62.81);Other abnormalities of gait and mobility (R26.89) Hemiplegia - Right/Left: Right Hemiplegia - dominant/non-dominant: Non-dominant     Time: 2831-5176 PT Time Calculation (min) (ACUTE ONLY): 25 min  Charges:  $Therapeutic Activity: 23-37 mins                     Erasmo Leventhal , PTA Acute Rehabilitation Services Pager [phone] Office [phone]     Fabiano Ginley Eli Hose 03/10/2019, 3:11 PM

## 2019-03-10 NOTE — Progress Notes (Addendum)
Nutrition Follow-up  DOCUMENTATION CODES:   Morbid obesity  INTERVENTION:  Continue Ensure Enlive po BID as tolerated, each supplement provides 350 kcal and 20 grams of protein.  If enteral nutrition is warranted, Recommend Osmolite 1.5 formula @ 25 ml/hr and increase by 10 ml every 4 hours to goal rate of 55 ml/hr with 30 ml Prostat BID.   Provides 2180 kcal (100% of needs), 113 grams of protein, and 1003 ml of H2O.   NUTRITION DIAGNOSIS:   Inadequate oral intake related to inability to eat as evidenced by NPO status; ongoing  GOAL:   Patient will meet greater than or equal to 90% of their needs; progressing  MONITOR:   PO intake, Supplement acceptance, Diet advancement, Labs, Weight trends, Skin, I & O's  REASON FOR ASSESSMENT:   Consult Enteral/tube feeding initiation and management  ASSESSMENT:   64 y.o. male with past medical history significant of chronic CHF, diabetes, chronic pain syndrome, chronic trach, was brought to ED via EMS from [facility] nursing facility for evaluation of altered mental status.CT head: Findings consistent with age related atrophy and chronic small vessel ischemia. Findings compatible with subacute extension of left PCA infarct. Chronic infarct right PCA territory unchanged. Chronic left PICA infarct unchanged. Negative for acute hemorrhage.  12/27- pulled out NGT 12/28- s/p MBSS- advanced to dysphagia 1 diet with thin liquids 1/3 - changed to NPO status for concern for aspiration and increased trach secretions  Diet advanced to a dysphagia 1 diet with thin liquids. Per SLP, pt's aspiration risk likely high. MD agreeable to return to PO intake for comfort with aspiration precautions in place. Goals of care being discussed with family regarding PEG tube vs comfort feeds. Meal completion 20% at lunch today. RD to continue with Ensure orders as tolerated. Labs and medications reviewed.    Diet Order:   Diet Order            DIET - DYS 1 Room  service appropriate? No; Fluid consistency: Thin  Diet effective now              EDUCATION NEEDS:   Not appropriate for education at this time  Skin:  Skin Assessment: Reviewed RN Assessment  Last BM:  1/4  Height:   Ht Readings from Last 1 Encounters:  02/19/19 5\' 7"  (1.702 m)    Weight:   Wt Readings from Last 1 Encounters:  03/09/19 114.5 kg    Ideal Body Weight:  67.27 kg  BMI:  Body mass index is 39.54 kg/m.  Estimated Nutritional Needs:   Kcal:  2000-2200  Protein:  105-125 grams  Fluid:  >/= 2 L/day    05/07/19, MS, RD, LDN Pager # [phone] After hours/ weekend pager # [phone]

## 2019-03-10 NOTE — Progress Notes (Signed)
Lavaged patient, patient plugging off, Nurse called RT due to chocolate ice crease coming out of stoma. RT placed end tidal to make sure trach was till in place, good color change. RT removed inner canula and changed. RT, lavaged and suctioned patient, still WOB,  Increased 10L with a sat of 95%. Nurse consulted the physician to have a consult with ENT and an x-ray to make sure patient is not aspirating.

## 2019-03-10 NOTE — Progress Notes (Signed)
Occupational Therapy Treatment Patient Details Name: Tyrone Cunningham MRN: [id number] DOB: Nov 13, 1955 Today's Date: 03/10/2019    History of present illness Tyrone Cunningham is a 64 y.o. male with PMHx: chronic CHF, diabetes, chronic pain syndrome, chronic trach, was brought to ED via EMS from [facility] for AMS with R sided facial drooping, R-sided weakness. Pt is usually bedbound but can walk on occasion; Pt is trach dependent. 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 Incidental lung nodule found.   OT comments  Pt very talkative initially when RN present, then only nodding head to therapist's questions. Session focus on sitting balance and seated grooming tasks x 2 minutes. Pt sitting EOB with mod A for balance/support, pt holding head downward and not following commands to initiate UB dressing tasks. Unable to get pt to attend to R side. OT will continue to follow acutely  Follow Up Recommendations   SNF   Equipment Recommendations  None recommended by OT    Recommendations for Other Services      Precautions / Restrictions Precautions Precautions: Fall;Other (comment) Precaution Comments: trach dependent, PMV Restrictions Weight Bearing Restrictions: No Other Position/Activity Restrictions: L posey mitt       Mobility Bed Mobility Overal bed mobility: Needs Assistance Bed Mobility: Supine to Sit;Sit to Supine     Supine to sit: Max assist Sit to supine: Total assist   General bed mobility comments: pt able to hold onto therapists arm to assist with elevating trunk, pt required total A for LEs to EOB  Transfers                 General transfer comment: unable    Balance Overall balance assessment: Needs assistance Sitting-balance support: Feet unsupported;Single extremity supported Sitting balance-Leahy Scale: Poor Sitting balance - Comments: required mod A for balance and support as well steadying on R side with bed rail                                    ADL either performed or assessed with clinical judgement   ADL Overall ADL's : Needs assistance/impaired Eating/Feeding: NPO   Grooming: Wash/dry hands;Wash/dry face;Minimal assistance Grooming Details (indicate cue type and reason): hand over hand assist                               General ADL Comments: session focus on sitting balance and seated grooming tasks x 2 minutes. Pt sitting EOB with mod A for balance/support, pt holding head downward and not following commands to initiate UB dressing tasks     Vision Patient Visual Report: No change from baseline     Perception     Praxis      Cognition Arousal/Alertness: Awake/alert Behavior During Therapy: Restless Overall Cognitive Status: No family/caregiver present to determine baseline cognitive functioning                                 General Comments: pt very talkative initially when RN present, then only nodding head to therapist's questions        Exercises Other Exercises Other Exercises: PROM of R UE in multiple planes, then positioned in elevation   Shoulder Instructions       General Comments      Pertinent  Vitals/ Pain       Pain Assessment: Faces Faces Pain Scale: Hurts a little bit Pain Location: generalized, movement of R LE Pain Descriptors / Indicators: Grimacing;Moaning Pain Intervention(s): Monitored during session;Repositioned  Home Living Family/patient expects to be discharged to:: Skilled nursing facility     Type of Home: [place]                                  Prior Functioning/Environment              Frequency           Progress Toward Goals  OT Goals(current goals can now be found in the care plan section)  Progress towards OT goals: OT to reassess next treatment  Acute Rehab OT Goals Patient Stated Goal: none stated  Plan Discharge plan remains appropriate     Co-evaluation                 AM-PAC OT "6 Clicks" Daily Activity     Outcome Measure   Help from another person eating meals?: Total Help from another person taking care of personal grooming?: A Lot Help from another person toileting, which includes using toliet, bedpan, or urinal?: Total Help from another person bathing (including washing, rinsing, drying)?: Total Help from another person to put on and taking off regular upper body clothing?: A Lot Help from another person to put on and taking off regular lower body clothing?: Total 6 Click Score: 8    End of Session    OT Visit Diagnosis: Unsteadiness on feet (R26.81);Muscle weakness (generalized) (M62.81);Pain;Hemiplegia and hemiparesis Hemiplegia - Right/Left: Right Hemiplegia - caused by: Cerebral infarction Pain - Right/Left: Right Pain - part of body: Leg   Activity Tolerance     Patient Left in bed;with call bell/phone within reach   Nurse Communication          Time: 2458-0998 OT Time Calculation (min): 14 min  Charges: OT General Charges $OT Visit: 1 Visit OT Treatments $Therapeutic Activity: 8-22 mins     Britt Bottom 03/10/2019, 12:51 PM

## 2019-03-10 NOTE — Progress Notes (Signed)
[facility] PROGRESS NOTE    Progress Note  Tyrone Cunningham  HLK:[id number] DOB: Jul 22, 1955 DOA: 02/19/2019 PCP: System, Pcp Not In     Brief Narrative:   Tyrone Cunningham is an 64 y.o. male past medical history significant for chronic systolic heart failure, diabetes mellitus type 2, chronic pain syndrome, chronic trach brought into the ED from [facility] nursing facility for evaluation of confusion per nursing home staff he had a faint right facial droop and right-sided weakness last known normal was the night before.  Assessment/Plan:   Acute CVA: With associated right hemiplegia and right facial droop MRI of the brain showed acute left pontine infarct to the PCA territory. Neurology was consulted recommended DTaP for 3 months followed by aspirin alone. Physical therapy evaluated the patient recommended skilled nursing facility, continue Lipitor.  Dysphagia: Likely due to CVA, NG tube was placed patient pulled it out and refused for it to be reinserted, he is amenable for PEG, MBS on 1228 was favorable he was started on a dysphagia 1 diet per started aspirating again. Repeated MBS on 03/07/2018 showed significant aspiration now n.p.o. The previous physician spoke with the brother regarding a PEG tube placement versus comfort feeds stable, discuss among family and get back to [place] in 1 to 2 days.  OSA/acute on chronic respiratory failure with hypoxia and hypercapnia: Patient is status post trach recently treated for left lower lobe pneumonia and has completed his course of antibiotics. Continue oxygen.  Acute metabolic encephalopathy: He waxes and wanes need is resolving slowly.  Essential hypertension: Continue lisinopril and metoprolol.  Sepsis secondary to right lower lobe pneumonia: Treated empirically with antibiotics is completed course.  Abdominal pain: Unremarkable.  Hypernatremia, resolved with free water.  1 out of 4 blood cultures positive for GPC: Likely a  contaminant.  Chronic diastolic heart failure: Continue digoxin.  Diabetes mellitus type 2: Insulin-dependent, with a last A1c of 8.3, continue long-acting insulin plus sliding scale.  Incidental finding of lung nodule: 4 mm in size will need follow-up as an outpatient with PCP  Goals of care: Palliative care services involved.  DVT prophylaxis: loveno Family Communication:none Disposition Plan/Barrier to D/C: unable to determine Code Status:     Code Status Orders  (From admission, onward)         Start     Ordered   02/19/19 1700  Full code  Continuous     02/19/19 1701        Code Status History    Date Active Date Inactive Code Status Order ID Comments User Context   10/09/2017 1855 10/15/2017 2154 Full Code [id number]  Pearson Grippe, MD ED   08/04/2017 1726 08/14/2017 1743 Full Code [id number]  Jonah Blue, MD Inpatient   03/21/2015 1619 03/24/2015 1717 Full Code [id number]  Jerald Kief, MD ED   01/04/2014 0336 01/07/2014 1849 Full Code [id number]  Lorretta Harp, MD Inpatient   10/16/2011 1538 10/20/2011 1939 Full Code [id number]  America Brown, RN Inpatient   03/09/2011 2256 03/15/2011 1711 Full Code [id number]  Young, Anthoney Harada, RN Inpatient   Advance Care Planning Activity        IV Access:    Peripheral IV   Procedures and diagnostic studies:   DG Swallowing Func-Speech Pathology  Result Date: 03/08/2019 Objective Swallowing Evaluation: Type of Study: MBS-Modified Barium Swallow Study  Patient Details Name: Tyrone Cunningham MRN: [id number] Date of Birth: 1955-05-08 Today's Date: 03/08/2019 Time: SLP Start Time (ACUTE ONLY): 1129 -SLP Stop Time (ACUTE  ONLY): 1153 SLP Time Calculation (min) (ACUTE ONLY): 24 min Past Medical History: Past Medical History: Diagnosis Date . Bronchitis  . CHF (congestive heart failure) (HCC)   grade 1 diastolic with preserved EF in 2017 . COPD (chronic obstructive pulmonary disease) ([place])  . Diabetes mellitus without complication ([place])  . DM type 2  with diabetic peripheral neuropathy ([place])  . Hearing loss  . Hypercholesteremia  . Hypertension  . Peripheral neuropathy  . Sleep apnea   Severe sleep apnea requiring tracheostomy Past Surgical History: Past Surgical History: Procedure Laterality Date . APPENDECTOMY   . CHOLECYSTECTOMY N/A 01/05/2014  Procedure: LAPAROSCOPIC CHOLECYSTECTOMY;  Surgeon: Rolm Bookbinder, MD;  Location: [facility];  Service: General;  Laterality: N/A; . FEMUR HARDWARE REMOVAL  09/01/2003  Removal of retained hardware, two interlocking distal femoral . FEMUR SURGERY   . HAND SURGERY   . MULTIPLE EXTRACTIONS WITH ALVEOLOPLASTY N/A 05/11/2012  Procedure: MULTIPLE EXTRACTION WITH ALVEOLOPLASTY;  Surgeon: Gae Bon, DDS;  Location: [facility];  Service: Oral Surgery;  Laterality: N/A; . TRACHEOSTOMY   HPI: Tyrone Cunningham is a 64 y.o. male with medical history significant of CHF, diabetes, chronic pain syndrome, was brought to ED via EMS from [place] facility for evaluation of altered mental status. Per nursing home staff, pt appears to have right sided facial drooping, and right-sided weakness. 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  No previous ST notes in Epic.  Pt reported that he does not wear a PMV and usually finger occludes or mouths words to communicate.  MBS 02/22/19 with recs for NPO.  Repeat MBS 12/28 much improved; started on dys1, thin liquids.  Pt had functional difficulty with thins at mealtime, so liquids were thickened to nectar and he appeared to tolerate much better.  Pt made NPO 1/3 after status change  Subjective: awake, alert, poor positioning, fair participation Assessment / Plan / Recommendation CHL IP CLINICAL IMPRESSIONS 03/08/2019 Clinical Impression Today's reassessment marks a decline in swallow function from MBSS on 12/28.  Study was completed with PMV in place for all bolus trials.  Pt presents with moderate-severe oropharyngeal dysphagia c/b lingual discoordination,  premature spillage, delayed swallow initiation, decreased base of tongue retraction, incomplete laryngeal closure, reduced pharyngeal stripping wave, and diminished sensation.  These deficits resulted in penetration of all consistencies trialed which could not be cleared. Cued cough was weak and ineffective at clearing penetration when pt was able to follow commands.  There is suspected aspiration of thin liquid which was at least partially cleared by reflexive cough.  Pt had very poor positioning and visuzalization of pharynx, larynx, and trachea was impacted by body habitus and positioning.  Pt also had fair to poor participation in this study which prohibited trials of additional compensatory strategies and reduced the number of bolus trials. Based on pt's cognitive deficits and fluctuating mental stats, it is suspected that pt would not be able to consistently execute compensatory strategies. There was significant pharyngeal residue, with vallecular space filled and some spillover and unsensed penetration of residue.  Based on the results of this study, pt is at risk for aspiration with any PO diet.  Consider discussion around goals of care.  If agressive intervention is desired, recommend pt remain NPO with alternate means of nutrition, hydration, and medication. If oral feeding is pursued for comfort, with known risk of aspiration, consider puree and nectar thick liquids. SLP Visit Diagnosis -- Attention and concentration deficit following -- Frontal lobe and  executive function deficit following -- Impact on safety and function --   CHL IP TREATMENT RECOMMENDATION 03/08/2019 Treatment Recommendations Therapy as outlined in treatment plan below   Prognosis 03/08/2019 Prognosis for Safe Diet Advancement Fair Barriers to Reach Goals Cognitive deficits;Language deficits;Severity of deficits Barriers/Prognosis Comment -- CHL IP DIET RECOMMENDATION 03/08/2019 SLP Diet Recommendations (No Data) Liquid Administration via  Cup;Spoon Medication Administration Crushed with puree Compensations Slow rate;Small sips/bites;Minimize environmental distractions Postural Changes --   CHL IP OTHER RECOMMENDATIONS 03/08/2019 Recommended Consults -- Oral Care Recommendations Oral care QID Other Recommendations --   CHL IP FOLLOW UP RECOMMENDATIONS 03/08/2019 Follow up Recommendations Skilled Nursing facility   [facility] IP FREQUENCY AND DURATION 03/08/2019 Speech Therapy Frequency (ACUTE ONLY) min 2x/week Treatment Duration 2 weeks      CHL IP ORAL PHASE 03/08/2019 Oral Phase Impaired Oral - Pudding Teaspoon -- Oral - Pudding Cup -- Oral - Honey Teaspoon -- Oral - Honey Cup Right anterior bolus loss;Weak lingual manipulation;Holding of bolus;Reduced posterior propulsion;Piecemeal swallowing;Decreased bolus cohesion;Premature spillage Oral - Nectar Teaspoon Weak lingual manipulation;Reduced posterior propulsion;Premature spillage Oral - Nectar Cup Right anterior bolus loss;Weak lingual manipulation;Reduced posterior propulsion;Premature spillage;Decreased bolus cohesion Oral - Nectar Straw -- Oral - Thin Teaspoon -- Oral - Thin Cup Right anterior bolus loss;Weak lingual manipulation;Piecemeal swallowing;Decreased bolus cohesion;Premature spillage Oral - Thin Straw -- Oral - Puree Weak lingual manipulation;Reduced posterior propulsion;Delayed oral transit;Decreased bolus cohesion;Premature spillage;Lingual/palatal residue Oral - Mech Soft -- Oral - Regular -- Oral - Multi-Consistency -- Oral - Pill -- Oral Phase - Comment --  CHL IP PHARYNGEAL PHASE 03/08/2019 Pharyngeal Phase WFL Pharyngeal- Pudding Teaspoon -- Pharyngeal -- Pharyngeal- Pudding Cup -- Pharyngeal -- Pharyngeal- Honey Teaspoon -- Pharyngeal -- Pharyngeal- Honey Cup Delayed swallow initiation-vallecula;Reduced pharyngeal peristalsis;Reduced tongue base retraction;Reduced airway/laryngeal closure;Penetration/Aspiration before swallow;Penetration/Aspiration during swallow;Pharyngeal residue -  valleculae Pharyngeal Material enters airway, remains ABOVE vocal cords and not ejected out Pharyngeal- Nectar Teaspoon -- Pharyngeal -- Pharyngeal- Nectar Cup Delayed swallow initiation-vallecula;Reduced pharyngeal peristalsis;Reduced tongue base retraction;Reduced airway/laryngeal closure;Penetration/Aspiration before swallow;Penetration/Aspiration during swallow;Pharyngeal residue - valleculae Pharyngeal Material enters airway, remains ABOVE vocal cords and not ejected out Pharyngeal- Nectar Straw -- Pharyngeal -- Pharyngeal- Thin Teaspoon -- Pharyngeal -- Pharyngeal- Thin Cup Delayed swallow initiation-pyriform sinuses;Reduced anterior laryngeal mobility;Reduced laryngeal elevation;Reduced airway/laryngeal closure;Reduced tongue base retraction;Penetration/Aspiration before swallow;Penetration/Aspiration during swallow;Moderate aspiration;Pharyngeal residue - pyriform Pharyngeal Material enters airway, passes BELOW cords and not ejected out despite cough attempt by patient Pharyngeal- Thin Straw -- Pharyngeal -- Pharyngeal- Puree Delayed swallow initiation-vallecula;Reduced pharyngeal peristalsis;Reduced tongue base retraction;Penetration/Aspiration before swallow;Penetration/Aspiration during swallow;Penetration/Apiration after swallow;Pharyngeal residue - valleculae Pharyngeal Material enters airway, remains ABOVE vocal cords and not ejected out Pharyngeal- Mechanical Soft -- Pharyngeal -- Pharyngeal- Regular -- Pharyngeal -- Pharyngeal- Multi-consistency -- Pharyngeal -- Pharyngeal- Pill -- Pharyngeal -- Pharyngeal Comment --  No flowsheet data found. Kerrie Pleasure, MA, CCC-SLP Acute Rehabilitation Services Office: [phone] 03/08/2019, 12:58 PM                Medical Consultants:    None.  Anti-Infectives:   None  Subjective:    Tayton Decaire nonverbal but appears great.  Objective:    Vitals:   03/10/19 0005 03/10/19 0309 03/10/19 0416 03/10/19 0755  BP: (!) 157/65  125/61 (!) 150/63    Pulse: 91 92 89 89  Resp: 18 18 18 18   Temp: 98.9 F (37.2 C)  97.6 F (36.4 C) 98.6 F (37 C)  TempSrc: Oral  Oral Oral  SpO2: 97% 95% 94% 93%  Weight:  Height:       SpO2: 93 % O2 Flow Rate (L/min): 10 L/min FiO2 (%): 40 %   Intake/Output Summary (Last 24 hours) at 03/10/2019 0915 Last data filed at 03/09/2019 1700 Gross per 24 hour  Intake 0 ml  Output --  Net 0 ml   Filed Weights   03/07/19 0306 03/08/19 0419 03/09/19 0331  Weight: 114.4 kg 114 kg 114.5 kg    Exam: General exam: In no acute distress. Respiratory system: Good air movement and clear to auscultation. Cardiovascular system: S1 & S2 heard, RRR. No JVD. Gastrointestinal system: Abdomen is nondistended, soft and nontender.  Central nervous system: Alert and oriented. No focal neurological deficits. Extremities: No pedal edema. Skin: No rashes, lesions or ulcers Psychiatry: Judgement and insight appear normal. Mood & affect appropriate.    Data Reviewed:    Labs: Basic Metabolic Panel: Recent Labs  Lab 03/04/19 0842 03/05/19 1124 03/06/19 0540 03/10/19 0454  NA 143 142 142 143  K 3.7 4.0 3.8 4.3  CL 101 103 98 97*  CO2 35* 34* 32 34*  GLUCOSE 159* 151* 122* 83  BUN 9 13 12 22   CREATININE 0.89 0.86 0.85 1.20  CALCIUM 9.2 8.7* 8.9 8.2*  MG  --   --   --  2.2   GFR Estimated Creatinine Clearance: 74.2 mL/min (by C-G formula based on SCr of 1.2 mg/dL). Liver Function Tests: Recent Labs  Lab 03/05/19 1124  AST 22  ALT 19  ALKPHOS 63  BILITOT 0.4  PROT 5.8*  ALBUMIN 1.8*   No results for input(s): LIPASE, AMYLASE in the last 168 hours. No results for input(s): AMMONIA in the last 168 hours. Coagulation profile No results for input(s): INR, PROTIME in the last 168 hours. COVID-19 Labs  No results for input(s): DDIMER, FERRITIN, LDH, CRP in the last 72 hours.  Lab Results  Component Value Date   SARSCOV2NAA NEGATIVE 02/19/2019    CBC: Recent Labs  Lab 03/04/19 0800  03/05/19 1124 03/06/19 0540 03/07/19 0236 03/10/19 0558  WBC 13.1* 12.8* 13.5* 11.2* 9.9  HGB 13.6 13.8 14.5 14.0 14.5  HCT 42.5 43.4 45.5 43.7 45.1  MCV 95.1 93.3 94.8 93.8 94.0  PLT 200 198 227 229 240   Cardiac Enzymes: No results for input(s): CKTOTAL, CKMB, CKMBINDEX, TROPONINI in the last 168 hours. BNP (last 3 results) No results for input(s): PROBNP in the last 8760 hours. CBG: Recent Labs  Lab 03/09/19 1956 03/10/19 0002 03/10/19 0005 03/10/19 0332 03/10/19 0752  GLUCAP 79 76 75 77 62*   D-Dimer: No results for input(s): DDIMER in the last 72 hours. Hgb A1c: No results for input(s): HGBA1C in the last 72 hours. Lipid Profile: No results for input(s): CHOL, HDL, LDLCALC, TRIG, CHOLHDL, LDLDIRECT in the last 72 hours. Thyroid function studies: No results for input(s): TSH, T4TOTAL, T3FREE, THYROIDAB in the last 72 hours.  Invalid input(s): FREET3 Anemia work up: No results for input(s): VITAMINB12, FOLATE, FERRITIN, TIBC, IRON, RETICCTPCT in the last 72 hours. Sepsis Labs: Recent Labs  Lab 03/05/19 1124 03/06/19 0540 03/07/19 0236 03/10/19 0558  PROCALCITON 0.22 0.22 0.23  --   WBC 12.8* 13.5* 11.2* 9.9   Microbiology Recent Results (from the past 240 hour(s))  Culture, Urine     Status: None   Collection Time: 03/02/19  8:49 AM   Specimen: Urine, Catheterized  Result Value Ref Range Status   Specimen Description URINE, CATHETERIZED  Final   Special Requests NONE  Final  Culture   Final    NO GROWTH Performed at [facility], [address] [place], [place] [address]    Report Status 03/03/2019 FINAL  Final  Culture, respiratory     Status: None   Collection Time: 03/03/19  7:18 PM   Specimen: Tracheal Aspirate  Result Value Ref Range Status   Specimen Description TRACHEAL ASPIRATE  Final   Special Requests NONE  Final   Gram Stain   Final    FEW SQUAMOUS EPITHELIAL CELLS PRESENT ABUNDANT WBC PRESENT, PREDOMINANTLY PMN ABUNDANT GRAM  POSITIVE RODS RARE GRAM POSITIVE COCCI Performed at [facility], [address] [place], [place] [address]    Culture ABUNDANT STAPHYLOCOCCUS EPIDERMIDIS  Final   Report Status 03/05/2019 FINAL  Final   Organism ID, Bacteria STAPHYLOCOCCUS EPIDERMIDIS  Final      Susceptibility   Staphylococcus epidermidis - MIC*    CIPROFLOXACIN 1 SENSITIVE Sensitive     ERYTHROMYCIN >=8 RESISTANT Resistant     GENTAMICIN <=0.5 SENSITIVE Sensitive     OXACILLIN >=4 RESISTANT Resistant     TETRACYCLINE >=16 RESISTANT Resistant     VANCOMYCIN 1 SENSITIVE Sensitive     TRIMETH/SULFA 160 RESISTANT Resistant     CLINDAMYCIN <=0.25 SENSITIVE Sensitive     RIFAMPIN <=0.5 SENSITIVE Sensitive     Inducible Clindamycin NEGATIVE Sensitive     * ABUNDANT STAPHYLOCOCCUS EPIDERMIDIS     Medications:   .  stroke: mapping our early stages of recovery book   Does not apply Once  . aspirin  300 mg Rectal Daily  . atorvastatin  20 mg Oral q1800  . budesonide  0.5 mg Nebulization BID  . clopidogrel  75 mg Oral Daily  . digoxin  0.25 mg Oral Daily  . enoxaparin (LOVENOX) injection  60 mg Subcutaneous Q24H  . feeding supplement (ENSURE ENLIVE)  237 mL Oral BID BM  . insulin aspart  0-24 Units Subcutaneous Q4H  . insulin aspart  0-5 Units Subcutaneous QHS  . insulin glargine  10 Units Subcutaneous Daily  . ipratropium-albuterol  3 mL Nebulization BID  . lisinopril  10 mg Oral Daily  . Resource ThickenUp Clear   Oral Once  . sodium chloride flush  10-40 mL Intracatheter Q12H   Continuous Infusions: . sodium chloride Stopped (02/28/19 2112)  . valproate sodium 500 mg (03/10/19 0517)     LOS: 19 days   Marinda ElkAbraham Feliz Ortiz  [facility]  03/10/2019, 9:15 AM

## 2019-03-10 NOTE — Plan of Care (Signed)
  Problem: Nutrition: Goal: Adequate nutrition will be maintained Outcome: Progressing   

## 2019-03-10 NOTE — Progress Notes (Signed)
Physical Therapy Treatment Patient Details Name: Tyrone Cunningham MRN: [id number] DOB: 09/21/1955 Today's Date: 03/10/2019    History of Present Illness Tyrone Cunningham is a 64 y.o. male with PMHx: chronic CHF, diabetes, chronic pain syndrome, chronic trach, was brought to ED via EMS from [facility] for AMS with R sided facial drooping, R-sided weakness. Pt is usually bedbound but can walk on occasion; Pt is trach dependent. 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 Incidental lung nodule found.    PT Comments    Pt assisted to [facility] for positioning to receive PO intake.  He was slumped to R side in sidelying position.  Pt required assistance for rolling and pad placement and boosting to [facility].  Once in sitting used pillows to prop patient to reduce lean and better manage his secretion, will returnn to assist patient OOB to chair later this pm. Continue to recommend SNF placement at this time.     Follow Up Recommendations  SNF     Equipment Recommendations  None recommended by PT    Recommendations for Other Services       Precautions / Restrictions Precautions Precautions: Fall;Other (comment) Precaution Comments: trach dependent, PMV Restrictions Weight Bearing Restrictions: No Other Position/Activity Restrictions: L posey mitt    Mobility  Bed Mobility Overal bed mobility: Needs Assistance Bed Mobility: Supine to Sit;Sit to Supine Rolling: Total assist;+2 for physical assistance   Supine to sit: Max assist Sit to supine: Total assist   General bed mobility comments: Rolling performed to reposition padding.  PTA assisted in boosting to [facility].  Once in HPB utilized pillows to position patient and allow for improved secretion management and safer position for PO intake.  Transfers                 General transfer comment: unable  Ambulation/Gait                 Stairs             Wheelchair Mobility    Modified  Rankin (Stroke Patients Only)       Balance Overall balance assessment: Needs assistance Sitting-balance support: Feet unsupported;Single extremity supported Sitting balance-Leahy Scale: Poor Sitting balance - Comments: required mod A for balance and support as well steadying on R side with bed rail       Standing balance comment: unable                            Cognition Arousal/Alertness: Awake/alert Behavior During Therapy: Restless Overall Cognitive Status: No family/caregiver present to determine baseline cognitive functioning                                 General Comments: Continues to be restless. He required frequent redirection to focus on task.      Exercises Other Exercises Other Exercises: PROM of R UE in multiple planes, then positioned in elevation    General Comments        Pertinent Vitals/Pain Pain Assessment: Faces Faces Pain Scale: Hurts little more Pain Location: generalized, movement of R LE Pain Descriptors / Indicators: Grimacing;Moaning Pain Intervention(s): Monitored during session;Repositioned    Home Living Family/patient expects to be discharged to:: Skilled nursing facility     Type of Home: Skilled Nursing Facility  Prior Function            PT Goals (current goals can now be found in the care plan section) Acute Rehab PT Goals Patient Stated Goal: none stated Potential to Achieve Goals: Fair Progress towards PT goals: Progressing toward goals    Frequency    Min 2X/week      PT Plan Current plan remains appropriate    Co-evaluation              AM-PAC PT "6 Clicks" Mobility   Outcome Measure  Help needed turning from your back to your side while in a flat bed without using bedrails?: Total Help needed moving from lying on your back to sitting on the side of a flat bed without using bedrails?: Total Help needed moving to and from a bed to a chair (including a  wheelchair)?: Total Help needed standing up from a chair using your arms (e.g., wheelchair or bedside chair)?: Total Help needed to walk in hospital room?: Total Help needed climbing 3-5 steps with a railing? : Total 6 Click Score: 6    End of Session   Activity Tolerance: Patient limited by fatigue;Patient limited by lethargy Patient left: in bed;with call bell/phone within reach;with bed alarm set Nurse Communication: Mobility status PT Visit Diagnosis: Hemiplegia and hemiparesis;Muscle weakness (generalized) (M62.81);Other abnormalities of gait and mobility (R26.89) Hemiplegia - Right/Left: Right Hemiplegia - dominant/non-dominant: Non-dominant     Time: 1324-4010 PT Time Calculation (min) (ACUTE ONLY): 13 min  Charges:  $Therapeutic Activity: 8-22 mins                     Erasmo Leventhal , PTA Acute Rehabilitation Services Pager [phone] Office [phone]     Nasario Czerniak Eli Hose 03/10/2019, 3:01 PM

## 2019-03-10 NOTE — Progress Notes (Signed)
  Speech Language Pathology Treatment: Dysphagia;Passy Muir Speaking valve  Patient Details Name: Tyrone Cunningham MRN: [id number] DOB: 12/27/1955 Today's Date: 03/10/2019 Time: 1025-8527 SLP Time Calculation (min) (ACUTE ONLY): 17 min  Assessment / Plan / Recommendation Clinical Impression  Skilled treatment session targeted pt's ability to tolerate PMSV as well safety when consuming PO intake. Pt was alert and side-laying bed with PMSV off. Pt engaged with therapist and willing to attempt trials of ice chips, thin water and applesauce. At baseline (prior to eating/drinking) pt didn't have any secretions coming out of trach hub and he didn't have any audible secretions when breathing. Trials were administered with and without PMSV in place. Pt consumed ice chips, thin water via straw (6 oz challenging consecutive sips) and applesauce without any overt s/s of aspiration. While silent aspiration cannot be ruled out at bedside, pt presentation during this session appears commensurate with that of his MBS. Therefore recommend return to dysphagia 1 diet with thin liquids, medicine crushed in applesauce. I spoke with pt's nurse as well as MD. While pt's aspiration risk is likely high, MD is agreeable to return to PO intake with aspiration precautions in place.   PMSV donned for 16 minutes with no signs of respiratory distress, no air trapping and good ability to phonate. Pt spoke at the word level with ~ 90% intelligibility and decreased at the phrase level to ~ 50-75%. Pt with imprecise articulation at the phrase level that was likely related to lingual deviation to right and overall decreased cognitive awareness. Orders written for PMSV use.    HPI HPI: Tyrone Cunningham is a 64 y.o. male with medical history significant of CHF, diabetes, chronic pain syndrome, was brought to ED via EMS from [facility] nursing facility for evaluation of altered mental status. Per nursing home staff, pt appears to have right sided facial  drooping, and right-sided weakness. 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  No previous ST notes in Epic.  Pt reported that he does not wear a PMV and usually finger occludes or mouths words to communicate.  MBS 02/22/19 with recs for NPO.  Repeat MBS 12/28 much improved; started on dys1, thin liquids.  Pt had functional difficulty with thins at mealtime, so liquids were thickened to nectar and he appeared to tolerate much better.  Pt made NPO 1/3 after status change      SLP Plan  Continue with current plan of care;MBS       Recommendations  Diet recommendations: Dysphagia 1 (puree);Thin liquid Liquids provided via: Straw Medication Administration: Crushed with puree Supervision: Full supervision/cueing for compensatory strategies Compensations: Slow rate;Small sips/bites;Minimize environmental distractions Postural Changes and/or Swallow Maneuvers: Seated upright 90 degrees      Patient may use Passy-Muir Speech Valve: During all therapies with supervision;During PO intake/meals;Intermittently with supervision PMSV Supervision: Full         Oral Care Recommendations: Oral care BID;Staff/trained caregiver to provide oral care Follow up Recommendations: Skilled Nursing facility SLP Visit Diagnosis: Dysphagia, oropharyngeal phase (R13.12);Aphonia (R49.1) Plan: Continue with current plan of care;MBS       GO                Karlin Heilman 03/10/2019, 11:19 AM

## 2019-03-11 LAB — GLUCOSE, CAPILLARY
Glucose-Capillary: 109 mg/dL — ABNORMAL HIGH (ref 70–99)
Glucose-Capillary: 120 mg/dL — ABNORMAL HIGH (ref 70–99)
Glucose-Capillary: 70 mg/dL (ref 70–99)
Glucose-Capillary: 84 mg/dL (ref 70–99)
Glucose-Capillary: 86 mg/dL (ref 70–99)
Glucose-Capillary: 89 mg/dL (ref 70–99)
Glucose-Capillary: 91 mg/dL (ref 70–99)

## 2019-03-11 MED ORDER — DEXTROSE 50 % IV SOLN
INTRAVENOUS | Status: AC
  Filled 2019-03-11: qty 50

## 2019-03-11 NOTE — Progress Notes (Signed)
Daily Progress Note   Patient Name: Tyrone Cunningham       Date: 03/11/2019 DOB: 1955-11-12  Age: 64 y.o. MRN#: [id number] Attending Physician: Marinda Elk, MD Primary Care Physician: System, Pcp Not In Admit Date: 02/19/2019  Reason for Consultation/Follow-up: Establishing goals of care  I received a call from bedside RN Sam this morning.  Sam stated patient appears to be suffering.  He is swatting away attention from nurses and caregivers.  He is moaning, and no longer speaking.  Subjective: I spoke on the phone with the patient's brother [name].  [name] tells me that [facility] course has been very confusing.  He has waxed and waned in both his physical and mental presentations.  [name] and his brother Marita Kansas were agreeable to send Pantelis to hospice house as they felt his overall quality of life was very poor and not going to improve.  They understand he will continue to aspirate even with a PEG tube in place.  [name] understands Maureen is now bedbound and in chronic pain.    There was some confusion over whether or not a PEG tube would be beneficial.  Jeannett Senior is reconsidering the option.  [name] tried to gain the patient's opinion, but I think he now understands Quindarrius does not have the capacity to understand his situation.   [name] will speak with his brother Marita Kansas again tonight.  Dr. Renee Pain had requested the family make a decision regarding PEG tube within 24 hours.  [name] and I agreed to talk again at 8 in the morning for a final decision regarding PEG tube versus residential hospice.   Assessment: Patient with recurrent aspiration after disabling CVA. Now bedbound, significant blindness, hearing loss, right sided paresis. Chronic trach and disabled at baseline prior to CVA.  Family making decisions  regarding PEG vs Hospice House.     Patient Profile/HPI:  64 y.o. male  with past medical history of  COPD, chronic pain, OSA with chronic tracheostomy, HFpEF, DM, and hearing loss who was admitted on 02/19/2019 from SNF with altered mental status and right sided weakness.  He was found to have a stroke and pneumonia.  He has failed his swallow study and refuses a PEG tube.    Length of Stay: 20  Current Medications: Scheduled Meds:  .  stroke: mapping our early  stages of recovery book   Does not apply Once  . aspirin  300 mg Rectal Daily  . atorvastatin  20 mg Oral q1800  . budesonide  0.5 mg Nebulization BID  . clopidogrel  75 mg Oral Daily  . digoxin  0.25 mg Oral Daily  . enoxaparin (LOVENOX) injection  60 mg Subcutaneous Q24H  . feeding supplement (ENSURE ENLIVE)  237 mL Oral BID BM  . insulin aspart  0-24 Units Subcutaneous Q4H  . insulin aspart  0-5 Units Subcutaneous QHS  . insulin glargine  10 Units Subcutaneous Daily  . ipratropium-albuterol  3 mL Nebulization BID  . lisinopril  10 mg Oral Daily  . Resource ThickenUp Clear   Oral Once  . sodium chloride flush  10-40 mL Intracatheter Q12H    Continuous Infusions: . sodium chloride Stopped (02/28/19 2112)  . dextrose 25 mL/hr at 03/11/19 1125  . valproate sodium 500 mg (03/11/19 1507)    PRN Meds: sodium chloride, acetaminophen **OR** acetaminophen (TYLENOL) oral liquid 160 mg/5 mL **OR** acetaminophen, albuterol, haloperidol lactate, LORazepam, morphine injection, sodium chloride flush    Vital Signs: BP 121/65 (BP Location: Left Leg)   Pulse 89   Temp 98.6 F (37 C) (Oral)   Resp 20   Ht 5\' 7"  (1.702 m)   Wt 114.5 kg   SpO2 95%   BMI 39.54 kg/m  SpO2: SpO2: 95 % O2 Device: O2 Device: Tracheostomy Collar O2 Flow Rate: O2 Flow Rate (L/min): (S) 8 L/min  Intake/output summary:   Intake/Output Summary (Last 24 hours) at 03/11/2019 1522 Last data filed at 03/11/2019 1023 Gross per 24 hour  Intake 60 ml   Output -  Net 60 ml   LBM: Last BM Date: 03/07/19 Baseline Weight: Weight: 122.5 kg Most recent weight: Weight: 114.5 kg       Palliative Assessment/Data: 20%    Flowsheet Rows     Most Recent Value  Intake Tab  Referral Department  Hospitalist  Unit at Time of Referral  Med/Surg Unit  Palliative Care Primary Diagnosis  Neurology  Date Notified  02/24/19  Palliative Care Type  New Palliative care  Reason for referral  Clarify Goals of Care  Date of Admission  02/19/19  Date first seen by Palliative Care  02/25/19  # of days Palliative referral response time  1 Day(s)  # of days IP prior to Palliative referral  5  Clinical Assessment  Psychosocial & Spiritual Assessment  Palliative Care Outcomes      Patient Active Problem List   Diagnosis Date Noted  . CHF (congestive heart failure) ([place])   . Acute on chronic respiratory failure ([place])   . Aspiration into airway   . Aspiration pneumonia of left lung ([place])   . Hemiparesis of right dominant side ([place])   . Chronic diastolic CHF (congestive heart failure) ([place]) 02/23/2019  . Stroke (cerebrum) ([place]) 02/19/2019  . HCAP (healthcare-associated pneumonia) 10/09/2017  . Hypokalemia 10/09/2017  . Abnormal liver function 10/09/2017  . Severe protein-calorie malnutrition ([place]) 10/09/2017  . Cellulitis and abscess of left leg 08/04/2017  . Diabetic foot ulcer ([place]) 08/04/2017  . Acute renal failure (ARF) ([place]) 08/04/2017  . SOB (shortness of breath) 09/13/2016  . Peripheral vascular disease ([place]) 08/19/2016  . Tracheostomy care ([place]) 06/28/2015  . Obstructive sleep apnea 06/28/2015  . Chronic pain syndrome 04/18/2015  . Constipation 01/07/2014  . Constipation, chronic 01/04/2014  . Chronic respiratory failure ([place]) 01/06/2013  . Palliative care encounter 03/16/2012  .  Edema 03/16/2012  . Obesity 10/20/2011  . 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 Tracheostomy dependent (HCC)  10/16/2011  . [name] with frequent exacerbations   . Hypertension   . Hypercholesteremia   . Hearing loss   . Diabetic neuropathy [facility])     Palliative Care Plan    Recommendations/Plan:  PMT to follow up with family on 1/9 at 8:00 am regarding PEG vs Hospice House and comfort care.   Goals of Care and Additional Recommendations:  Limitations on Scope of Treatment: Full Scope Treatment  Code Status:  Full code  Prognosis:   < 2 weeks once artificial hydration and feeding is discontinued.  Possibly sooner if he has a bad episode of aspiration.   Discharge Planning:  To Be Determined  Care plan was discussed with Stevphen [place], RN Sam, Drs. Feliz-Ortiz and Rama.  Thank you for allowing the Palliative Medicine Team to assist in the care of this patient.  Total time spent:  35 min.     Greater than 50%  of this time was spent counseling and coordinating care related to the above assessment and plan.  Norvel Richards, PA-C Palliative Medicine  Please contact Palliative MedicineTeam phone at [phone] for questions and concerns between 7 am - 7 pm.   Please see AMION for individual provider pager numbers.

## 2019-03-11 NOTE — Progress Notes (Signed)
[facility] PROGRESS NOTE    Progress Note  Tyrone Cunningham  DVV:[id number] DOB: 03-27-1955 DOA: 02/19/2019 PCP: System, Pcp Not In     Brief Narrative:   Tyrone Cunningham is an 64 y.o. male past medical history significant for chronic systolic heart failure, diabetes mellitus type 2, chronic pain syndrome, chronic trach brought into the ED from [facility] nursing facility for evaluation of confusion per nursing home staff he had a faint right facial droop and right-sided weakness last known normal was the night before.  Assessment/Plan:   Acute CVA: With associated right hemiplegia and right facial droop MRI of the brain showed acute left pontine infarct to the PCA territory. Neurology was consulted recommended DTaP for 3 months followed by aspirin alone. Physical therapy evaluated the patient recommended skilled nursing facility, continue Lipitor.  Dysphagia: Likely due to CVA, NG tube was placed patient pulled it out and refused for it to be reinserted. MBS on 1228 was favorable he was started on a dysphagia 1 diet per started aspirating again. Repeated MBS on 03/07/2018 showed significant aspiration now n.p.o. The previous physician spoke with the brother regarding a PEG tube placement versus comfort feeds stable.  No undecided about PEG tube or moving towards comfort care. Palliative care will speak to the family about PEG and direction of care.  OSA/acute on chronic respiratory failure with hypoxia and hypercapnia: Patient is status post trach recently treated for left lower lobe pneumonia and has completed his course of antibiotics. Continue oxygen.  Acute metabolic encephalopathy: He waxes and wanes need is resolving slowly.  Essential hypertension: Continue lisinopril and metoprolol.  Sepsis secondary to right lower lobe pneumonia: Treated empirically with antibiotics is completed course.  Abdominal pain: Unremarkable.  Hypernatremia: Now resolved with free water.  1  out of 4 blood cultures positive for GPC: Likely a contaminant.  Chronic diastolic heart failure: Continue digoxin.  Diabetes mellitus type 2: Insulin-dependent, with a last A1c of 8.3, continue long-acting insulin plus sliding scale.  Incidental finding of lung nodule: 4 mm in size will need follow-up as an outpatient with PCP  Goals of care: Palliative care services involved.  DVT prophylaxis: lovenox Family Communication:none Disposition Plan/Barrier to D/C: unable to determine Code Status:     Code Status Orders  (From admission, onward)         Start     Ordered   02/19/19 1700  Full code  Continuous     02/19/19 1701        Code Status History    Date Active Date Inactive Code Status Order ID Comments User Context   10/09/2017 1855 10/15/2017 2154 Full Code [id number]  Pearson Grippe, MD ED   08/04/2017 1726 08/14/2017 1743 Full Code [id number]  Tyrone Blue, MD Inpatient   03/21/2015 1619 03/24/2015 1717 Full Code [id number]  Tyrone Kief, MD ED   01/04/2014 0336 01/07/2014 1849 Full Code [id number]  Tyrone Harp, MD Inpatient   10/16/2011 1538 10/20/2011 1939 Full Code [id number]  Tyrone Brown, RN Inpatient   03/09/2011 2256 03/15/2011 1711 Full Code [id number]  Young, Anthoney Harada, RN Inpatient   Advance Care Planning Activity        IV Access:    Peripheral IV   Procedures and diagnostic studies:   DG CHEST PORT 1 VIEW  Result Date: 03/10/2019 CLINICAL DATA:  Dyspnea. EXAM: PORTABLE CHEST 1 VIEW COMPARISON:  March 05, 2019 FINDINGS: There is stable tracheostomy tube positioning. A stable area of hazy opacification is  seen within the right lung base. The left lung remains clear. The heart size and mediastinal contours are within normal limits. The visualized skeletal structures are unremarkable. IMPRESSION: 1. Stable area of hazy right basilar opacification likely consistent with an area of atelectasis and/or infiltrate. An associated pleural effusion cannot be excluded.  Electronically Signed   By: Aram Candela M.D.   On: 03/10/2019 18:58     Medical Consultants:    None.  Anti-Infectives:   None  Subjective:    Tyrone Cunningham nonverbal no complaints.  Objective:    Vitals:   03/11/19 0311 03/11/19 0428 03/11/19 0754 03/11/19 0855  BP:  (!) 142/77 (!) 147/71   Pulse: 86 88 88 90  Resp: 18 19 17 18   Temp:  98.3 F (36.8 C) (!) 97.4 F (36.3 C)   TempSrc:  Oral Axillary   SpO2: 94% 93% 91% 91%  Weight:      Height:       SpO2: 91 % O2 Flow Rate (L/min): 10 L/min FiO2 (%): 40 %   Intake/Output Summary (Last 24 hours) at 03/11/2019 1001 Last data filed at 03/11/2019 0517 Gross per 24 hour  Intake 410 ml  Output --  Net 410 ml   Filed Weights   03/07/19 0306 03/08/19 0419 03/09/19 0331  Weight: 114.4 kg 114 kg 114.5 kg    Exam: General exam: In no acute distress trach in place. Respiratory system: Good air movement and clear to auscultation. Cardiovascular system: S1 & S2 heard, RRR. No JVD. Gastrointestinal system: Abdomen is nondistended, soft and nontender.  Central nervous system: Alert and oriented X 1. Extremities: No pedal edema. Skin: No rashes, lesions or ulcers   Data Reviewed:    Labs: Basic Metabolic Panel: Recent Labs  Lab 03/05/19 1124 03/06/19 0540 03/10/19 0454  NA 142 142 143  K 4.0 3.8 4.3  CL 103 98 97*  CO2 34* 32 34*  GLUCOSE 151* 122* 83  BUN 13 12 22   CREATININE 0.86 0.85 1.20  CALCIUM 8.7* 8.9 8.2*  MG  --   --  2.2   GFR Estimated Creatinine Clearance: 74.2 mL/min (by C-G formula based on SCr of 1.2 mg/dL). Liver Function Tests: Recent Labs  Lab 03/05/19 1124  AST 22  ALT 19  ALKPHOS 63  BILITOT 0.4  PROT 5.8*  ALBUMIN 1.8*   No results for input(s): LIPASE, AMYLASE in the last 168 hours. No results for input(s): AMMONIA in the last 168 hours. Coagulation profile No results for input(s): INR, PROTIME in the last 168 hours. COVID-19 Labs  No results for input(s):  DDIMER, FERRITIN, LDH, CRP in the last 72 hours.  Lab Results  Component Value Date   SARSCOV2NAA NEGATIVE 02/19/2019    CBC: Recent Labs  Lab 03/05/19 1124 03/06/19 0540 03/07/19 0236 03/10/19 0558  WBC 12.8* 13.5* 11.2* 9.9  HGB 13.8 14.5 14.0 14.5  HCT 43.4 45.5 43.7 45.1  MCV 93.3 94.8 93.8 94.0  PLT 198 227 229 240   Cardiac Enzymes: No results for input(s): CKTOTAL, CKMB, CKMBINDEX, TROPONINI in the last 168 hours. BNP (last 3 results) No results for input(s): PROBNP in the last 8760 hours. CBG: Recent Labs  Lab 03/10/19 1658 03/10/19 1953 03/10/19 2339 03/11/19 0428 03/11/19 0757  GLUCAP 98 121* 90 84 86   D-Dimer: No results for input(s): DDIMER in the last 72 hours. Hgb A1c: No results for input(s): HGBA1C in the last 72 hours. Lipid Profile: No results for input(s): CHOL, HDL,  LDLCALC, TRIG, CHOLHDL, LDLDIRECT in the last 72 hours. Thyroid function studies: No results for input(s): TSH, T4TOTAL, T3FREE, THYROIDAB in the last 72 hours.  Invalid input(s): FREET3 Anemia work up: No results for input(s): VITAMINB12, FOLATE, FERRITIN, TIBC, IRON, RETICCTPCT in the last 72 hours. Sepsis Labs: Recent Labs  Lab 03/05/19 1124 03/06/19 0540 03/07/19 0236 03/10/19 0558  PROCALCITON 0.22 0.22 0.23  --   WBC 12.8* 13.5* 11.2* 9.9   Microbiology Recent Results (from the past 240 hour(s))  Culture, Urine     Status: None   Collection Time: 03/02/19  8:49 AM   Specimen: Urine, Catheterized  Result Value Ref Range Status   Specimen Description URINE, CATHETERIZED  Final   Special Requests NONE  Final   Culture   Final    NO GROWTH Performed at [facility], [address] [place], [place] [address]    Report Status 03/03/2019 FINAL  Final  Culture, respiratory     Status: None   Collection Time: 03/03/19  7:18 PM   Specimen: Tracheal Aspirate  Result Value Ref Range Status   Specimen Description TRACHEAL ASPIRATE  Final   Special Requests NONE   Final   Gram Stain   Final    FEW SQUAMOUS EPITHELIAL CELLS PRESENT ABUNDANT WBC PRESENT, PREDOMINANTLY PMN ABUNDANT GRAM POSITIVE RODS RARE GRAM POSITIVE COCCI Performed at [facility], [address] [place], [place] [address]    Culture ABUNDANT STAPHYLOCOCCUS EPIDERMIDIS  Final   Report Status 03/05/2019 FINAL  Final   Organism ID, Bacteria STAPHYLOCOCCUS EPIDERMIDIS  Final      Susceptibility   Staphylococcus epidermidis - MIC*    CIPROFLOXACIN 1 SENSITIVE Sensitive     ERYTHROMYCIN >=8 RESISTANT Resistant     GENTAMICIN <=0.5 SENSITIVE Sensitive     OXACILLIN >=4 RESISTANT Resistant     TETRACYCLINE >=16 RESISTANT Resistant     VANCOMYCIN 1 SENSITIVE Sensitive     TRIMETH/SULFA 160 RESISTANT Resistant     CLINDAMYCIN <=0.25 SENSITIVE Sensitive     RIFAMPIN <=0.5 SENSITIVE Sensitive     Inducible Clindamycin NEGATIVE Sensitive     * ABUNDANT STAPHYLOCOCCUS EPIDERMIDIS     Medications:   .  stroke: mapping our early stages of recovery book   Does not apply Once  . aspirin  300 mg Rectal Daily  . atorvastatin  20 mg Oral q1800  . budesonide  0.5 mg Nebulization BID  . clopidogrel  75 mg Oral Daily  . digoxin  0.25 mg Oral Daily  . enoxaparin (LOVENOX) injection  60 mg Subcutaneous Q24H  . feeding supplement (ENSURE ENLIVE)  237 mL Oral BID BM  . insulin aspart  0-24 Units Subcutaneous Q4H  . insulin aspart  0-5 Units Subcutaneous QHS  . insulin glargine  10 Units Subcutaneous Daily  . ipratropium-albuterol  3 mL Nebulization BID  . lisinopril  10 mg Oral Daily  . Resource ThickenUp Clear   Oral Once  . sodium chloride flush  10-40 mL Intracatheter Q12H   Continuous Infusions: . sodium chloride Stopped (02/28/19 2112)  . dextrose    . valproate sodium 500 mg (03/11/19 0521)     LOS: 20 days   Charlynne Cousins  [facility]  03/11/2019, 10:01 AM

## 2019-03-11 NOTE — Progress Notes (Signed)
CCMD reported 5uns of VT. Denies any SOB, chest pain. Notified NP with no new order.

## 2019-03-12 DIAGNOSIS — Z66 Do not resuscitate: Secondary | ICD-10-CM

## 2019-03-12 LAB — GLUCOSE, CAPILLARY
Glucose-Capillary: 104 mg/dL — ABNORMAL HIGH (ref 70–99)
Glucose-Capillary: 122 mg/dL — ABNORMAL HIGH (ref 70–99)

## 2019-03-12 MED ORDER — ONDANSETRON HCL 4 MG/2ML IJ SOLN: 4.0000 mg | Freq: Four times a day (QID) | INTRAMUSCULAR | Status: DC | PRN

## 2019-03-12 MED ORDER — HALOPERIDOL LACTATE 2 MG/ML PO CONC
0.5000 mg | ORAL | Status: DC | PRN
  Filled 2019-03-12: qty 0.3

## 2019-03-12 MED ORDER — HALOPERIDOL LACTATE 5 MG/ML IJ SOLN: 0.5000 mg | INTRAMUSCULAR | Status: DC | PRN

## 2019-03-12 MED ORDER — MORPHINE SULFATE (PF) 2 MG/ML IV SOLN
2.0000 mg | Freq: Four times a day (QID) | INTRAVENOUS | Status: DC
  Administered 2019-03-12 – 2019-03-13 (×6): 2 mg via INTRAVENOUS
  Filled 2019-03-12 (×6): qty 1

## 2019-03-12 MED ORDER — GLYCOPYRROLATE 1 MG PO TABS
1.0000 mg | ORAL_TABLET | ORAL | Status: DC | PRN
  Filled 2019-03-12: qty 1

## 2019-03-12 MED ORDER — HALOPERIDOL 0.5 MG PO TABS: 0.5000 mg | ORAL_TABLET | ORAL | Status: DC | PRN

## 2019-03-12 MED ORDER — POLYVINYL ALCOHOL 1.4 % OP SOLN: 1.0000 [drp] | Freq: Four times a day (QID) | OPHTHALMIC | Status: DC | PRN

## 2019-03-12 MED ORDER — BIOTENE DRY MOUTH MT LIQD: 15.0000 mL | OROMUCOSAL | Status: DC | PRN

## 2019-03-12 MED ORDER — ONDANSETRON 4 MG PO TBDP: 4.0000 mg | ORAL_TABLET | Freq: Four times a day (QID) | ORAL | Status: DC | PRN

## 2019-03-12 MED ORDER — GLYCOPYRROLATE 0.2 MG/ML IJ SOLN
0.2000 mg | INTRAMUSCULAR | Status: DC | PRN
  Administered 2019-03-12 – 2019-03-13 (×3): 0.2 mg via INTRAVENOUS
  Filled 2019-03-12 (×3): qty 1

## 2019-03-12 MED ORDER — GLYCOPYRROLATE 0.2 MG/ML IJ SOLN: 0.2000 mg | INTRAMUSCULAR | Status: DC | PRN

## 2019-03-12 NOTE — Progress Notes (Addendum)
Civil engineer, contracting Documentation  Liaison received referral for pt to dc to [facility] for EOL care. [facility] does not currently have any beds available for transfer today.   Writer called pt's brother, [name], to confirm interest in [facility] and left a VM for brother to return call. Once confirmation is received, [facility] can move forward with determining eligibility for [facility].   Please outreach with any questions and thank you for the referral.    1510 addendum: Writer spoke with pt's brother, [name], who confirmed wishes to have pt transfer to [facility] for EOL care. Advised that [facility] will communicate with him and the hospital when a bed becomes available.  Trena Platt, RN [facility] Liaison  [phone]

## 2019-03-12 NOTE — Progress Notes (Signed)
PROGRESS NOTE    Dimitrious Micciche  ZOX:[id number] DOB: 1955/12/28 DOA: 02/19/2019 PCP: System, Pcp Not In   Brief Narrative:  Fernado Brigante is an 64 y.o. male past medical history significant for chronic systolic heart failure, diabetes mellitus type 2, chronic pain syndrome, chronic trach brought into the ED from [facility] nursing facility for evaluation of confusion per nursing home staff he had a faint right facial droop and right-sided weakness last known normal was the night before.   Assessment & Plan:   Principal Problem:   Stroke (cerebrum) (HCC) Active Problems:   Hypertension   Tracheostomy dependent (HCC)   DM (diabetes mellitus), type 2 with neurological complications (HCC)   Palliative care encounter   Chronic diastolic CHF (congestive heart failure) (HCC)   Hemiparesis of right dominant side (HCC)   Aspiration pneumonia of left lung (HCC)   Aspiration into airway   Acute on chronic respiratory failure (HCC)   Moving to comfort care with hospice, transition to hospice house pending  Was being treated as follows: Acute CVA: With associated right hemiplegia and right facial droop MRI of the brain showed acute left pontine infarct to the PCA territory. Neurology was consulted recommended DTaP for 3 months followed by aspirin alone. Physical therapy evaluated the patient recommended skilled nursing facility, continue Lipitor.  Dysphagia: Likely due to CVA, NG tube was placed patient pulled it out and refused for it to be reinserted. MBS on 1228 was favorable he was started on a dysphagia 1 diet per started aspirating again. Repeated MBS on 03/07/2018 showed significant aspiration now n.p.o. The previous physician spoke with the brother regarding a PEG tube placement versus comfort feeds stable.  No undecided about PEG tube or moving towards comfort care. Palliative care will speak to the family about PEG and direction of care.  OSA/acute on chronic respiratory failure with  hypoxia and hypercapnia: Patient is status post trach recently treated for left lower lobe pneumonia and has completed his course of antibiotics. Continue oxygen.  Acute metabolic encephalopathy: He waxes and wanes need is resolving slowly.  Essential hypertension: Continue lisinopril and metoprolol.  Sepsis secondary to right lower lobe pneumonia: Treated empirically with antibiotics is completed course.  Abdominal pain: Unremarkable.  Hypernatremia: Now resolved with free water.  1 out of 4 blood cultures positive for GPC: Likely a contaminant.  Chronic diastolic heart failure: Continue digoxin.  Diabetes mellitus type 2: Insulin-dependent, with a last A1c of 8.3, continue long-acting insulin plus sliding scale.  Incidental finding of lung nodule: 4 mm in size will need follow-up as an outpatient with PCP  Goals of care: Palliative care services involved.    DVT prophylaxis: Lovenox SQ  Code Status: DNR    Code Status Orders  (From admission, onward)         Start     Ordered   03/12/19 0818  Do not attempt resuscitation (DNR)  Continuous    Question Answer Comment  In the event of cardiac or respiratory ARREST Do not call a "code blue"   In the event of cardiac or respiratory ARREST Do not perform Intubation, CPR, defibrillation or ACLS   In the event of cardiac or respiratory ARREST Use medication by any route, position, wound care, and other measures to relive pain and suffering. May use oxygen, suction and manual treatment of airway obstruction as needed for comfort.      03/12/19 0825        Code Status History    Date  Active Date Inactive Code Status Order ID Comments User Context   03/12/2019 0817 03/12/2019 0825 DNR [id number]  Trevor Iha, PA-C Inpatient   02/19/2019 1701 03/12/2019 0817 Full Code [id number]  Emeline General, MD ED   10/09/2017 1855 10/15/2017 2154 Full Code [id number]  Pearson Grippe, MD ED   08/04/2017 1726 08/14/2017 1743 Full  Code [id number]  Jonah Blue, MD Inpatient   03/21/2015 1619 03/24/2015 1717 Full Code [id number]  Jerald Kief, MD ED   01/04/2014 0336 01/07/2014 1849 Full Code [id number]  Lorretta Harp, MD Inpatient   10/16/2011 1538 10/20/2011 1939 Full Code [id number]  America Brown, RN Inpatient   03/09/2011 2256 03/15/2011 1711 Full Code [id number]  Young, Anthoney Harada, RN Inpatient   Advance Care Planning Activity     Family Communication: None today palliative care discussed family's wishes today and they wish to pursue hospice care Disposition Plan:   Pending transition to hospice house Consults called: None Admission status: Inpatient   Consultants:   palliative care  Procedures:  CT Angio Head W or Wo Contrast  Result Date: 02/19/2019 CLINICAL DATA:  Right upper and lower extremity weakness. EXAM: CT ANGIOGRAPHY HEAD AND NECK TECHNIQUE: Multidetector CT imaging of the head and neck was performed using the standard protocol during bolus administration of intravenous contrast. Multiplanar CT image reconstructions and MIPs were obtained to evaluate the vascular anatomy. Carotid stenosis measurements (when applicable) are obtained utilizing NASCET criteria, using the distal internal carotid diameter as the denominator. CONTRAST:  60mL OMNIPAQUE IOHEXOL 350 MG/ML SOLN COMPARISON:  None. FINDINGS: CTA NECK FINDINGS Aortic arch: Standard 3 vessel aortic arch with moderate atherosclerotic plaque. No significant arch vessel origin stenosis. Right carotid system: Patent with scattered mild, predominantly calcified plaque in the common carotid and proximal internal carotid arteries. No evidence of significant stenosis or dissection. Left carotid system: Patent with scattered calcified and soft plaque in the common carotid artery and moderate, predominantly calcified plaque at the carotid bifurcation and in the carotid bulb. No evidence of significant stenosis or dissection. Vertebral arteries: Patent with the right  being mildly dominant. Calcified plaque at the vertebral artery origins results in severe stenosis on the right and mild stenosis on the left. Skeleton: Left canal wall up mastoidectomy. Congenital C2-3 fusion. Severe right facet arthrosis at C3-4. Other neck: No evidence of cervical lymphadenopathy or mass. Upper chest: Mild mosaic attenuation in the lung apices, possibly air trapping. Scattered small nodules in both lung apices with the largest measuring 4 mm in the right upper lobe (series 5, image 180). Review of the MIP images confirms the above findings CTA HEAD FINDINGS Anterior circulation: The internal carotid arteries are patent from skull base to carotid termini with calcified plaque resulting in mild-to-moderate left greater than right cavernous and mild left paraclinoid stenoses. ACAs and MCAs are patent with moderate distal branch vessel irregular narrowing but no evidence of proximal branch occlusion or significant proximal stenosis. No aneurysm is identified. Posterior circulation: The intracranial vertebral arteries are widely patent to the basilar. Patent right AICA and bilateral SCA origins are visualized. PICAs and a left AICA are not clearly identified. The basilar artery is patent with a mild-to-moderate stenosis in its midportion. There is a medium-sized left posterior communicating artery. Both PCAs are attenuated with moderate to severe P1 and severe P2 stenoses bilaterally. No aneurysm is identified. Venous sinuses: As permitted by contrast timing, patent. Anatomic variants: None. Review of the MIP images confirms the above findings IMPRESSION: 1.  No emergent large vessel occlusion. 2. Age advanced intracranial atherosclerosis including moderate to severe bilateral PCA stenoses in the setting of chronic bilateral PCA infarcts. 3. Mild-to-moderate basilar artery and bilateral intracranial ICA stenoses. 4. Severe right vertebral artery origin stenosis. 5. Small biapical lung nodules measuring  up to 4 mm. No follow-up needed if patient is low-risk (and has no known or suspected primary neoplasm). Non-contrast chest CT can be considered in 12 months if patient is high-risk. This recommendation follows the consensus statement: Guidelines for Management of Incidental Pulmonary Nodules Detected on CT Images: From the Fleischner Society 2017; Radiology 2017; 284:228-243. 6. Aortic Atherosclerosis (ICD10-I70.0). Electronically Signed   By: Sebastian Ache M.D.   On: 02/19/2019 17:03   CT HEAD WO CONTRAST  Result Date: 02/21/2019 CLINICAL DATA:  Stroke follow-up EXAM: CT HEAD WITHOUT CONTRAST TECHNIQUE: Contiguous axial images were obtained from the base of the skull through the vertex without intravenous contrast. COMPARISON:  CT head 02/19/2019, 11/26/2018 FINDINGS: Brain: Chronic right PCA infarct unchanged. This involves the right occipital lobe extending into the medial temporal lobe. Chronic left PICA infarct unchanged Hypodensity in the left occipital lobe is ill-defined and appears larger compared with 11/26/2018. No change from 02/19/2019. This is most compatible with subacute extension of infarct in the left PCA territory. Generalized atrophy. Negative for hydrocephalus. No acute hemorrhage or mass. Vascular: Negative for hyperdense vessel Skull: Negative Sinuses/Orbits: Chronic infarct left medial orbit. Mild mucosal edema in the paranasal sinuses. Negative orbit bilaterally. Other: None IMPRESSION: Left occipital hypodensity appears ill-defined and has progressed since 11/26/2018. No change from 02/19/2019. Findings compatible with subacute extension of left PCA infarct Chronic infarct right PCA territory unchanged. Chronic left PICA infarct unchanged. Negative for acute hemorrhage. Electronically Signed   By: Marlan Palau M.D.   On: 02/21/2019 09:14   CT HEAD WO CONTRAST  Result Date: 02/19/2019 CLINICAL DATA:  Right-sided weakness EXAM: CT HEAD WITHOUT CONTRAST TECHNIQUE: Contiguous axial  images were obtained from the base of the skull through the vertex without intravenous contrast. COMPARISON:  November 26, 2018. FINDINGS: Brain: No evidence of acute territorial infarction, hemorrhage, hydrocephalus,extra-axial collection or mass lesion/mass effect. There is dilatation the ventricles and sulci consistent with age-related atrophy. Low-attenuation changes in the deep white matter consistent with small vessel ischemia. Again noted is extensive areas of encephalomalacia involving the bilateral posterior parietooccipital lobes with continued evolution from the prior exam. Vascular: No hyperdense vessel or unexpected calcification. Skull: The skull is intact. No fracture or focal lesion identified. Sinuses/Orbits: The visualized paranasal sinuses and mastoid air cells are clear. The orbits and globes intact. Other: None IMPRESSION: Findings consistent with age related atrophy and chronic small vessel ischemia Continued evolution of encephalomalacia in the bilateral posterior parieto-occipital lobes. Electronically Signed   By: Jonna Clark M.D.   On: 02/19/2019 15:47   CT Angio Neck W and/or Wo Contrast  Result Date: 02/19/2019 CLINICAL DATA:  Right upper and lower extremity weakness. EXAM: CT ANGIOGRAPHY HEAD AND NECK TECHNIQUE: Multidetector CT imaging of the head and neck was performed using the standard protocol during bolus administration of intravenous contrast. Multiplanar CT image reconstructions and MIPs were obtained to evaluate the vascular anatomy. Carotid stenosis measurements (when applicable) are obtained utilizing NASCET criteria, using the distal internal carotid diameter as the denominator. CONTRAST:  63mL OMNIPAQUE IOHEXOL 350 MG/ML SOLN COMPARISON:  None. FINDINGS: CTA NECK FINDINGS Aortic arch: Standard 3 vessel aortic arch with moderate atherosclerotic plaque. No significant arch vessel origin stenosis. Right carotid  system: Patent with scattered mild, predominantly calcified  plaque in the common carotid and proximal internal carotid arteries. No evidence of significant stenosis or dissection. Left carotid system: Patent with scattered calcified and soft plaque in the common carotid artery and moderate, predominantly calcified plaque at the carotid bifurcation and in the carotid bulb. No evidence of significant stenosis or dissection. Vertebral arteries: Patent with the right being mildly dominant. Calcified plaque at the vertebral artery origins results in severe stenosis on the right and mild stenosis on the left. Skeleton: Left canal wall up mastoidectomy. Congenital C2-3 fusion. Severe right facet arthrosis at C3-4. Other neck: No evidence of cervical lymphadenopathy or mass. Upper chest: Mild mosaic attenuation in the lung apices, possibly air trapping. Scattered small nodules in both lung apices with the largest measuring 4 mm in the right upper lobe (series 5, image 180). Review of the MIP images confirms the above findings CTA HEAD FINDINGS Anterior circulation: The internal carotid arteries are patent from skull base to carotid termini with calcified plaque resulting in mild-to-moderate left greater than right cavernous and mild left paraclinoid stenoses. ACAs and MCAs are patent with moderate distal branch vessel irregular narrowing but no evidence of proximal branch occlusion or significant proximal stenosis. No aneurysm is identified. Posterior circulation: The intracranial vertebral arteries are widely patent to the basilar. Patent right AICA and bilateral SCA origins are visualized. PICAs and a left AICA are not clearly identified. The basilar artery is patent with a mild-to-moderate stenosis in its midportion. There is a medium-sized left posterior communicating artery. Both PCAs are attenuated with moderate to severe P1 and severe P2 stenoses bilaterally. No aneurysm is identified. Venous sinuses: As permitted by contrast timing, patent. Anatomic variants: None. Review of  the MIP images confirms the above findings IMPRESSION: 1. No emergent large vessel occlusion. 2. Age advanced intracranial atherosclerosis including moderate to severe bilateral PCA stenoses in the setting of chronic bilateral PCA infarcts. 3. Mild-to-moderate basilar artery and bilateral intracranial ICA stenoses. 4. Severe right vertebral artery origin stenosis. 5. Small biapical lung nodules measuring up to 4 mm. No follow-up needed if patient is low-risk (and has no known or suspected primary neoplasm). Non-contrast chest CT can be considered in 12 months if patient is high-risk. This recommendation follows the consensus statement: Guidelines for Management of Incidental Pulmonary Nodules Detected on CT Images: From the Fleischner Society 2017; Radiology 2017; 284:228-243. 6. Aortic Atherosclerosis (ICD10-I70.0). Electronically Signed   By: Logan Bores M.D.   On: 02/19/2019 17:03   MR BRAIN WO CONTRAST  Result Date: 02/22/2019 CLINICAL DATA:  Follow-up stroke. EXAM: MRI HEAD WITHOUT CONTRAST TECHNIQUE: Multiplanar, multiecho pulse sequences of the brain and surrounding structures were obtained without intravenous contrast. COMPARISON:  CT head 02/21/2019 FINDINGS: Brain: Acute infarct left PCA territory involving the left hippocampus with extension into the left lower occipital lobe. This corresponds to the hypodensity on CT. Acute infarct in the left pons, 10 x 15 mm. Chronic infarct right occipital lobe. Generalized atrophy. Negative for hemorrhage. Vascular: Normal arterial flow voids Skull and upper cervical spine: Negative Sinuses/Orbits: Negative Other: Incomplete study.  Patient not able to initial sequences. IMPRESSION: Acute infarct left PCA territory involving the hippocampus and occipital lobe Acute infarct left pons. Chronic infarct right occipital lobe. Incomplete study, degraded by motion. Electronically Signed   By: Franchot Gallo M.D.   On: 02/22/2019 14:48   [place] CHEST (PLEURAL  EFFUSION)  Result Date: 03/05/2019 CLINICAL DATA:  Acute on chronic respiratory failure. Evaluate  for left-sided pleural effusion and perform ultrasound-guided thoracentesis as indicated. EXAM: CHEST ULTRASOUND COMPARISON:  Chest radiograph-03/05/2019 FINDINGS: Sonographic evaluation of the left chest is negative for any significant pleural fluid. As such, ultrasound-guided thoracentesis was not attempted. IMPRESSION: No significant left-sided pleural effusion. Ultrasound-guided thoracentesis was not attempted. Electronically Signed   By: Simonne Come M.D.   On: 03/05/2019 10:01   DG CHEST PORT 1 VIEW  Result Date: 03/10/2019 CLINICAL DATA:  Dyspnea. EXAM: PORTABLE CHEST 1 VIEW COMPARISON:  March 05, 2019 FINDINGS: There is stable tracheostomy tube positioning. A stable area of hazy opacification is seen within the right lung base. The left lung remains clear. The heart size and mediastinal contours are within normal limits. The visualized skeletal structures are unremarkable. IMPRESSION: 1. Stable area of hazy right basilar opacification likely consistent with an area of atelectasis and/or infiltrate. An associated pleural effusion cannot be excluded. Electronically Signed   By: Aram Candela M.D.   On: 03/10/2019 18:58   DG CHEST PORT 1 VIEW  Result Date: 03/06/2019 CLINICAL DATA:  Aspiration into airway. EXAM: PORTABLE CHEST 1 VIEW COMPARISON:  Radiograph earlier this day FINDINGS: Tracheostomy tube tip at the thoracic inlet. Similar hazy opacity at the right lung base likely combination of airspace disease and pleural fluid. Left lung appears clear. Unchanged heart size and mediastinal contours. No pneumothorax. Multiple overlying monitoring devices. IMPRESSION: 1. No significant change from earlier this day. Unchanged hazy opacity at the right lung base, likely combination of airspace disease and pleural fluid. 2. Tracheostomy tube tip at the thoracic inlet. Electronically Signed   By: Narda Rutherford M.D.   On: 03/06/2019 01:07   DG CHEST PORT 1 VIEW  Result Date: 03/05/2019 CLINICAL DATA:  Acute on chronic respiratory failure. EXAM: PORTABLE CHEST 1 VIEW COMPARISON:  March 04, 2019. FINDINGS: Stable cardiomediastinal silhouette. Tracheostomy tube is unchanged in position. No pneumothorax is noted. Left lung is clear. Mild right pleural effusion is noted with associated atelectasis or infiltrate. Bony thorax is unremarkable. IMPRESSION: Stable mild right pleural effusion with associated atelectasis or infiltrate. Stable tracheostomy tube. No pneumothorax is noted. Electronically Signed   By: Lupita Raider M.D.   On: 03/05/2019 09:27   DG CHEST PORT 1 VIEW  Result Date: 03/04/2019 CLINICAL DATA:  Reason for exam: acute on chronic respiratory failure Patient non verbal throughout exam. Showed difficulty holding still for images. Hx of chf, bronchitis, copd, diabetes, htn. Hx alveoloplasty 2014, tracheostomy. Quit smoking 2001. EXAM: PORTABLE CHEST 1 VIEW COMPARISON:  Chest radiograph 02/21/2019 FINDINGS: Stable cardiomediastinal contours. Tracheostomy in place. Moderate size right layering pleural effusion. Persistent right basilar consolidative opacity could reflect atelectasis or infection. Left lung is clear. No pneumothorax. IMPRESSION: Small to moderate size right pleural effusion with persistent right basilar opacity possibly atelectasis or infection. Electronically Signed   By: Emmaline Kluver M.D.   On: 03/04/2019 11:40   DG CHEST PORT 1 VIEW  Result Date: 02/21/2019 CLINICAL DATA:  Altered mental status. History of bronchitis, congestive heart failure, COPD, diabetes, hypertension and sleep apnea. EXAM: PORTABLE CHEST 1 VIEW COMPARISON:  Radiographs 02/19/2019 and 10/19/2017. FINDINGS: 1159 hours. The tracheostomy appears unchanged. The heart size and mediastinal contours are stable with aortic atherosclerosis. Interval increased opacity at the right lung base which may reflect  worsening atelectasis or developing infiltrate. Probable mild left basilar scarring is unchanged. There may be a small amount of pleural fluid on the right. No pneumothorax or edema. The bones appear stable. IMPRESSION: 1.  Increased right basilar opacity may reflect worsening atelectasis or developing infiltrate. Possible small right pleural effusion. 2. No other significant changes. Electronically Signed   By: Carey Bullocks M.D.   On: 02/21/2019 12:23   DG Chest Portable 1 View  Result Date: 02/19/2019 CLINICAL DATA:  Altered mental status. Tracheostomy patient. History of hypertension, congestive heart failure, diabetes and COPD. EXAM: PORTABLE CHEST 1 VIEW COMPARISON:  Radiographs 10/19/2017 and 10/12/2017. FINDINGS: 1656 hours. Tracheostomy appears well positioned. The heart size and mediastinal contours are stable. There is interval improved aeration of the right lung base with probable mild chronic basilar scarring. No new airspace disease, edema, pleural effusion or pneumothorax. The bones appear unchanged. Telemetry leads overlie the chest. IMPRESSION: Interval improved aeration of the right lung base. No acute cardiopulmonary process. Electronically Signed   By: Carey Bullocks M.D.   On: 02/19/2019 17:13   DG Abd Portable 1V  Result Date: 03/07/2019 CLINICAL DATA:  Abdominal pain. EXAM: PORTABLE ABDOMEN - 1 VIEW COMPARISON:  February 28, 2019 FINDINGS: The bowel gas pattern is normal. Radiopaque surgical clips are seen overlying the right upper quadrant. No radio-opaque calculi or other significant radiographic abnormality are seen. IMPRESSION: Negative. Electronically Signed   By: Aram Candela M.D.   On: 03/07/2019 19:22   DG Abd Portable 1V  Result Date: 02/28/2019 CLINICAL DATA:  Abdominal distention EXAM: PORTABLE ABDOMEN - 1 VIEW COMPARISON:  02/23/2019 FINDINGS: Prior cholecystectomy. Nonobstructive bowel gas pattern. No organomegaly or free air. IMPRESSION: No acute findings.  No  evidence of bowel obstruction. Electronically Signed   By: Charlett Nose M.D.   On: 02/28/2019 12:20   DG Abd Portable 1V  Result Date: 02/23/2019 CLINICAL DATA:  Nasogastric tube placement EXAM: PORTABLE ABDOMEN - 1 VIEW COMPARISON:  Portable exam 1709 hours compared to 1525 hours FINDINGS: Tip of nasogastric tube projects over stomach, proximal side-port near the GE junction. Volume loss in the RIGHT hemithorax with mediastinal shift to RIGHT. RIGHT pleural effusion and basilar atelectasis. LEFT lung base clear. IMPRESSION: Tip of nasogastric tube projects over proximal stomach with proximal side-port near the GE junction. Electronically Signed   By: Ulyses Southward M.D.   On: 02/23/2019 17:41   DG Abd Portable 1V  Result Date: 02/23/2019 CLINICAL DATA:  NG tube placement. EXAM: PORTABLE ABDOMEN - 1 VIEW COMPARISON:  Chest radiograph 02/21/2019 FINDINGS: The patient is rotated to the right. A new enteric tube is in place with tip in the expected region of the GE junction or gastric cardia and side hole overlying the lower third of the thoracic esophagus. There is persistent right basilar lung opacity which may reflect a combination of airspace consolidation and pleural fluid, not grossly progressive from the previous chest radiograph. IMPRESSION: Enteric tube terminating in the region of the GE junction or gastric cardia. Advancement by 10 cm would ensure that the tip is well within the stomach and that the side hole is also in the stomach. These results will be called to the ordering clinician or representative by the Radiologist Assistant, and communication documented in the PACS or zVision Dashboard. Electronically Signed   By: Sebastian Ache M.D.   On: 02/23/2019 15:49   DG Swallowing Func-Speech Pathology  Result Date: 03/08/2019 Objective Swallowing Evaluation: Type of Study: MBS-Modified Barium Swallow Study  Patient Details Name: Thoren Hosang MRN: [id number] Date of Birth: June 30, 1955 Today's Date:  03/08/2019 Time: SLP Start Time (ACUTE ONLY): 1129 -SLP Stop Time (ACUTE ONLY): 1153 SLP Time Calculation (min) (ACUTE ONLY):  24 min Past Medical History: Past Medical History: Diagnosis Date . Bronchitis  . CHF (congestive heart failure) (HCC)   grade 1 diastolic with preserved EF in 0981 . COPD (chronic obstructive pulmonary disease) (HCC)  . Diabetes mellitus without complication (HCC)  . DM type 2 with diabetic peripheral neuropathy (HCC)  . Hearing loss  . Hypercholesteremia  . Hypertension  . Peripheral neuropathy  . Sleep apnea   Severe sleep apnea requiring tracheostomy Past Surgical History: Past Surgical History: Procedure Laterality Date . APPENDECTOMY   . CHOLECYSTECTOMY N/A 01/05/2014  Procedure: LAPAROSCOPIC CHOLECYSTECTOMY;  Surgeon: Emelia Loron, MD;  Location: [facility];  Service: General;  Laterality: N/A; . FEMUR HARDWARE REMOVAL  09/01/2003  Removal of retained hardware, two interlocking distal femoral . FEMUR SURGERY   . HAND SURGERY   . MULTIPLE EXTRACTIONS WITH ALVEOLOPLASTY N/A 05/11/2012  Procedure: MULTIPLE EXTRACTION WITH ALVEOLOPLASTY;  Surgeon: [name], DDS;  Location: [facility];  Service: Oral Surgery;  Laterality: N/A; . TRACHEOSTOMY   HPI: Leighton Brickley is a 64 y.o. male with medical history significant of CHF, diabetes, chronic pain syndrome, was brought to ED via EMS from [facility] nursing facility for evaluation of altered mental status. Per nursing home staff, pt appears to have right sided facial drooping, and right-sided weakness. 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  No previous ST notes in Epic.  Pt reported that he does not wear a PMV and usually finger occludes or mouths words to communicate.  MBS 02/22/19 with recs for NPO.  Repeat MBS 12/28 much improved; started on dys1, thin liquids.  Pt had functional difficulty with thins at mealtime, so liquids were thickened to nectar and he appeared to tolerate much better.  Pt made NPO 1/3  after status change  Subjective: awake, alert, poor positioning, fair participation Assessment / Plan / Recommendation CHL IP CLINICAL IMPRESSIONS 03/08/2019 Clinical Impression Today's reassessment marks a decline in swallow function from MBSS on 12/28.  Study was completed with PMV in place for all bolus trials.  Pt presents with moderate-severe oropharyngeal dysphagia c/b lingual discoordination, premature spillage, delayed swallow initiation, decreased base of tongue retraction, incomplete laryngeal closure, reduced pharyngeal stripping wave, and diminished sensation.  These deficits resulted in penetration of all consistencies trialed which could not be cleared. Cued cough was weak and ineffective at clearing penetration when pt was able to follow commands.  There is suspected aspiration of thin liquid which was at least partially cleared by reflexive cough.  Pt had very poor positioning and visuzalization of pharynx, larynx, and trachea was impacted by body habitus and positioning.  Pt also had fair to poor participation in this study which prohibited trials of additional compensatory strategies and reduced the number of bolus trials. Based on pt's cognitive deficits and fluctuating mental stats, it is suspected that pt would not be able to consistently execute compensatory strategies. There was significant pharyngeal residue, with vallecular space filled and some spillover and unsensed penetration of residue.  Based on the results of this study, pt is at risk for aspiration with any PO diet.  Consider discussion around goals of care.  If agressive intervention is desired, recommend pt remain NPO with alternate means of nutrition, hydration, and medication. If oral feeding is pursued for comfort, with known risk of aspiration, consider puree and nectar thick liquids. SLP Visit Diagnosis -- Attention and concentration deficit following -- Frontal lobe and executive function deficit following -- Impact on safety  and function --   CHL IP TREATMENT RECOMMENDATION 03/08/2019 Treatment Recommendations Therapy as outlined in treatment plan below   Prognosis 03/08/2019 Prognosis for Safe Diet Advancement Fair Barriers to Reach Goals Cognitive deficits;Language deficits;Severity of deficits Barriers/Prognosis Comment -- CHL IP DIET RECOMMENDATION 03/08/2019 SLP Diet Recommendations (No Data) Liquid Administration via Cup;Spoon Medication Administration Crushed with puree Compensations Slow rate;Small sips/bites;Minimize environmental distractions Postural Changes --   CHL IP OTHER RECOMMENDATIONS 03/08/2019 Recommended Consults -- Oral Care Recommendations Oral care QID Other Recommendations --   CHL IP FOLLOW UP RECOMMENDATIONS 03/08/2019 Follow up Recommendations [facility] D[facility] 03/08/2019 Speech Therapy Frequency (ACUTE ONLY) min 2x/week Treatment Duration 2 weeks      CHL IP ORAL PHASE 03/08/2019 Oral Phase Impaired Oral - Pudding Teaspoon -- Oral - Pudding Cup -- Oral - Honey Teaspoon -- Oral - Honey Cup Right anterior bolus loss;Weak lingual manipulation;Holding of bolus;Reduced posterior propulsion;Piecemeal swallowing;Decreased bolus cohesion;Premature spillage Oral - Nectar Teaspoon Weak lingual manipulation;Reduced posterior propulsion;Premature spillage Oral - Nectar Cup Right anterior bolus loss;Weak lingual manipulation;Reduced posterior propulsion;Premature spillage;Decreased bolus cohesion Oral - Nectar Straw -- Oral - Thin Teaspoon -- Oral - Thin Cup Right anterior bolus loss;Weak lingual manipulation;Piecemeal swallowing;Decreased bolus cohesion;Premature spillage Oral - Thin Straw -- Oral - Puree Weak lingual manipulation;Reduced posterior propulsion;Delayed oral transit;Decreased bolus cohesion;Premature spillage;Lingual/palatal residue Oral - Mech Soft -- Oral - Regular -- Oral - Multi-Consistency -- Oral - Pill -- Oral Phase - Comment --  CHL IP PHARYNGEAL PHASE 03/08/2019 Pharyngeal  Phase WFL Pharyngeal- Pudding Teaspoon -- Pharyngeal -- Pharyngeal- Pudding Cup -- Pharyngeal -- Pharyngeal- Honey Teaspoon -- Pharyngeal -- Pharyngeal- Honey Cup Delayed swallow initiation-vallecula;Reduced pharyngeal peristalsis;Reduced tongue base retraction;Reduced airway/laryngeal closure;Penetration/Aspiration before swallow;Penetration/Aspiration during swallow;Pharyngeal residue - valleculae Pharyngeal Material enters airway, remains ABOVE vocal cords and not ejected out Pharyngeal- Nectar Teaspoon -- Pharyngeal -- Pharyngeal- Nectar Cup Delayed swallow initiation-vallecula;Reduced pharyngeal peristalsis;Reduced tongue base retraction;Reduced airway/laryngeal closure;Penetration/Aspiration before swallow;Penetration/Aspiration during swallow;Pharyngeal residue - valleculae Pharyngeal Material enters airway, remains ABOVE vocal cords and not ejected out Pharyngeal- Nectar Straw -- Pharyngeal -- Pharyngeal- Thin Teaspoon -- Pharyngeal -- Pharyngeal- Thin Cup Delayed swallow initiation-pyriform sinuses;Reduced anterior laryngeal mobility;Reduced laryngeal elevation;Reduced airway/laryngeal closure;Reduced tongue base retraction;Penetration/Aspiration before swallow;Penetration/Aspiration during swallow;Moderate aspiration;Pharyngeal residue - pyriform Pharyngeal Material enters airway, passes BELOW cords and not ejected out despite cough attempt by patient Pharyngeal- Thin Straw -- Pharyngeal -- Pharyngeal- Puree Delayed swallow initiation-vallecula;Reduced pharyngeal peristalsis;Reduced tongue base retraction;Penetration/Aspiration before swallow;Penetration/Aspiration during swallow;Penetration/Apiration after swallow;Pharyngeal residue - valleculae Pharyngeal Material enters airway, remains ABOVE vocal cords and not ejected out Pharyngeal- Mechanical Soft -- Pharyngeal -- Pharyngeal- Regular -- Pharyngeal -- Pharyngeal- Multi-consistency -- Pharyngeal -- Pharyngeal- Pill -- Pharyngeal -- Pharyngeal Comment  --  No flowsheet data found. Kerrie PleasureLeigh E Borum, MA, CCC-SLP Acute Rehabilitation Services Office: [phone] 03/08/2019, 12:58 PM              DG Swallowing Func-Speech Pathology  Result Date: 02/28/2019 Objective Swallowing Evaluation: Type of Study: MBS-Modified Barium Swallow Study  Patient Details Name: [name] Ramaker MRN: [id number] Date of Birth: 11/19/1955 Today's Date: 02/28/2019 Time: SLP Start Time (ACUTE ONLY): 1440 -SLP Stop Time (ACUTE ONLY): 1502 SLP Time Calculation (min) (ACUTE ONLY): 22 min Past Medical History: Past Medical History: Diagnosis Date . Bronchitis  . CHF (congestive heart failure) (HCC)   grade 1 diastolic with preserved EF in [id number] . COPD (chronic obstructive pulmonary disease) (HCC)  . Diabetes mellitus without complication (HCC)  . DM type  2 with diabetic peripheral neuropathy (HCC)  . Hearing loss  . Hypercholesteremia  . Hypertension  . Peripheral neuropathy  . Sleep apnea   Severe sleep apnea requiring tracheostomy Past Surgical History: Past Surgical History: Procedure Laterality Date . APPENDECTOMY   . CHOLECYSTECTOMY N/A 01/05/2014  Procedure: LAPAROSCOPIC CHOLECYSTECTOMY;  Surgeon: Emelia Loron, MD;  Location: [facility];  Service: General;  Laterality: N/A; . FEMUR HARDWARE REMOVAL  09/01/2003  Removal of retained hardware, two interlocking distal femoral . FEMUR SURGERY   . HAND SURGERY   . MULTIPLE EXTRACTIONS WITH ALVEOLOPLASTY N/A 05/11/2012  Procedure: MULTIPLE EXTRACTION WITH ALVEOLOPLASTY;  Surgeon: [name], DDS;  Location: [facility];  Service: Oral Surgery;  Laterality: N/A; . TRACHEOSTOMY   HPI: Bretton Tandy is a 64 y.o. male with medical history significant of CHF, diabetes, chronic pain syndrome, was brought to ED via EMS from [facility] nursing facility for evaluation of altered mental status. Per nursing home staff, pt appears to have right sided facial drooping, and right-sided weakness. 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   No previous ST notes in Epic.  Pt reported that he does not wear a PMV and usually finger occludes or mouths words to communicate.  MBS 02/22/19 with recs for NPO with AMN  Subjective: alert Assessment / Plan / Recommendation CHL IP CLINICAL IMPRESSIONS 02/28/2019 Clinical Impression Pt presents with much improved swallow function marked by persisting but improved oral dysphagia, with only mild anterior loss. Pharyngeal phase significantly improved with improved timeliness of swallow onset, reliable laryngeal vestibule closure with large, successive swallows of nectar (no penetration/aspiration), and only one incident of penetration with thin liquids; no aspiration. Pt is safe to begin a dysphagia 1 diet with thin liquids; PMV must be in place for all POs; crush meds.   SLP Visit Diagnosis Dysphagia, oropharyngeal phase (R13.12);Aphonia (R49.1) Attention and concentration deficit following -- Frontal lobe and executive function deficit following -- Impact on safety and function Mild aspiration risk   CHL IP TREATMENT RECOMMENDATION 02/28/2019 Treatment Recommendations Therapy as outlined in treatment plan below   Prognosis 02/28/2019 Prognosis for Safe Diet Advancement Good Barriers to Reach Goals -- Barriers/Prognosis Comment -- CHL IP DIET RECOMMENDATION 02/28/2019 SLP Diet Recommendations Dysphagia 1 (Puree) solids;Thin liquid Liquid Administration via Cup Medication Administration Crushed with puree Compensations Minimize environmental distractions;Small sips/bites Postural Changes --   CHL IP OTHER RECOMMENDATIONS 02/28/2019 Recommended Consults -- Oral Care Recommendations Oral care BID Other Recommendations --   CHL IP FOLLOW UP RECOMMENDATIONS 02/23/2019 Follow up Recommendations Skilled Nursing facility   [facility] IP FREQUENCY AND DURATION 02/22/2019 Speech Therapy Frequency (ACUTE ONLY) min 2x/week Treatment Duration 2 weeks      CHL IP ORAL PHASE 02/28/2019 Oral Phase Impaired Oral - Pudding Teaspoon -- Oral -  Pudding Cup -- Oral - Honey Teaspoon -- Oral - Honey Cup -- Oral - Nectar Teaspoon -- Oral - Nectar Cup -- Oral - Nectar Straw -- Oral - Thin Teaspoon -- Oral - Thin Cup -- Oral - Thin Straw -- Oral - Puree -- Oral - Mech Soft -- Oral - Regular -- Oral - Multi-Consistency -- Oral - Pill -- Oral Phase - Comment --  CHL IP PHARYNGEAL PHASE 02/28/2019 Pharyngeal Phase Impaired Pharyngeal- Pudding Teaspoon -- Pharyngeal -- Pharyngeal- Pudding Cup -- Pharyngeal -- Pharyngeal- Honey Teaspoon -- Pharyngeal -- Pharyngeal- Honey Cup -- Pharyngeal -- Pharyngeal- Nectar Teaspoon -- Pharyngeal -- Pharyngeal- Nectar Cup Delayed swallow initiation-pyriform sinuses;Pharyngeal residue - valleculae  Pharyngeal Material does not enter airway Pharyngeal- Nectar Straw Delayed swallow initiation-vallecula;Pharyngeal residue - valleculae Pharyngeal Material does not enter airway Pharyngeal- Thin Teaspoon -- Pharyngeal -- Pharyngeal- Thin Cup Delayed swallow initiation-pyriform sinuses Pharyngeal Material does not enter airway Pharyngeal- Thin Straw Delayed swallow initiation-pyriform sinuses;Penetration/Aspiration before swallow Pharyngeal Material enters airway, remains ABOVE vocal cords and not ejected out Pharyngeal- Puree -- Pharyngeal -- Pharyngeal- Mechanical Soft -- Pharyngeal -- Pharyngeal- Regular -- Pharyngeal -- Pharyngeal- Multi-consistency -- Pharyngeal -- Pharyngeal- Pill -- Pharyngeal -- Pharyngeal Comment --  No flowsheet data found. Blenda Mounts Laurice 02/28/2019, 4:13 PM              DG Swallowing Func-Speech Pathology  Result Date: 02/22/2019 Objective Swallowing Evaluation: Type of Study: MBS-Modified Barium Swallow Study  Patient Details Name: Albaro Deviney MRN: [id number] Date of Birth: 1955/06/03 Today's Date: 02/22/2019 Time: SLP Start Time (ACUTE ONLY): 1320 -SLP Stop Time (ACUTE ONLY): 1350 SLP Time Calculation (min) (ACUTE ONLY): 30 min Past Medical History: Past Medical History: Diagnosis Date . Bronchitis   . CHF (congestive heart failure) (HCC)   grade 1 diastolic with preserved EF in 7829 . COPD (chronic obstructive pulmonary disease) (HCC)  . Diabetes mellitus without complication (HCC)  . DM type 2 with diabetic peripheral neuropathy (HCC)  . Hearing loss  . Hypercholesteremia  . Hypertension  . Peripheral neuropathy  . Sleep apnea   Severe sleep apnea requiring tracheostomy Past Surgical History: Past Surgical History: Procedure Laterality Date . APPENDECTOMY   . CHOLECYSTECTOMY N/A 01/05/2014  Procedure: LAPAROSCOPIC CHOLECYSTECTOMY;  Surgeon: Emelia Loron, MD;  Location: [facility];  Service: General;  Laterality: N/A; . FEMUR HARDWARE REMOVAL  09/01/2003  Removal of retained hardware, two interlocking distal femoral . FEMUR SURGERY   . HAND SURGERY   . MULTIPLE EXTRACTIONS WITH ALVEOLOPLASTY N/A 05/11/2012  Procedure: MULTIPLE EXTRACTION WITH ALVEOLOPLASTY;  Surgeon: [name], DDS;  Location: [facility];  Service: Oral Surgery;  Laterality: N/A; . TRACHEOSTOMY   HPI: Tyrone Cunningham is a 64 y.o. male with medical history significant of CHF, diabetes, chronic pain syndrome, was brought to ED via EMS from [facility] nursing facility for evaluation of altered mental status. Per nursing home staff, pt appears to have right sided facial drooping, and right-sided weakness. 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  No previous ST notes in Epic.  Pt reported that he does not wear a PMV and usually finger occludes or mouths words to communicate.   Subjective: alert Assessment / Plan / Recommendation CHL IP CLINICAL IMPRESSIONS 02/22/2019 Clinical Impression Pt participated in modified barium swallow - positioning and body habitus prevented adequate visualization of structures. PMV was worn during study. Pt presented with a moderate oral and pharyngeal dysphagia marked by poor oral control and organization, leading to diffuse spread of barium throughout oral cavity and passively into  pharynx.  There was continual lingual rocking noted in an effort to propel bolus, often ineffectually.  There was aspiration of thin and nectar liquids before, during, and after the swallow.  Laryngeal vestibule closure was generally incomplete, with poor arytenoid to epiglottic approximation and inadequate epiglottic inversion (on fewer than 10% of occasions, epiglottis did completely invert).  Aspiration elicited a consistent cough response - it did not effectively clear aspirate from the trachea/larynx.  There was pharyngeal residue post-swallow.  No solids were given due to poor ability to clear liquids through pharynx.  For now, recommend continued NPO; consider cortrak.  Will direct  therapeutic efforts at timing/strengthening/control of swallow.  Notified MD and RN re: results.  SLP Visit Diagnosis Dysphagia, oropharyngeal phase (R13.12) Attention and concentration deficit following -- Frontal lobe and executive function deficit following -- Impact on safety and function Severe aspiration risk   CHL IP TREATMENT RECOMMENDATION 02/22/2019 Treatment Recommendations Therapy as outlined in treatment plan below   Prognosis 02/22/2019 Prognosis for Safe Diet Advancement Fair Barriers to Reach Goals Language deficits Barriers/Prognosis Comment -- CHL IP DIET RECOMMENDATION 02/22/2019 SLP Diet Recommendations NPO;Alternative means - temporary Liquid Administration via -- Medication Administration Via alternative means Compensations -- Postural Changes --   CHL IP OTHER RECOMMENDATIONS 02/22/2019 Recommended Consults -- Oral Care Recommendations Oral care QID Other Recommendations --   CHL IP FOLLOW UP RECOMMENDATIONS 02/22/2019 Follow up Recommendations Skilled Nursing facility   [facility] IP FREQUENCY AND DURATION 02/22/2019 Speech Therapy Frequency (ACUTE ONLY) min 2x/week Treatment Duration 2 weeks      CHL IP ORAL PHASE 02/22/2019 Oral Phase Impaired Oral - Pudding Teaspoon -- Oral - Pudding Cup -- Oral - Honey Teaspoon  -- Oral - Honey Cup -- Oral - Nectar Teaspoon Right anterior bolus loss;Weak lingual manipulation;Lingual pumping;Reduced posterior propulsion;Right pocketing in lateral sulci;Pocketing in anterior sulcus;Piecemeal swallowing;Decreased bolus cohesion Oral - Nectar Cup Right anterior bolus loss;Weak lingual manipulation;Lingual pumping;Reduced posterior propulsion;Right pocketing in lateral sulci;Pocketing in anterior sulcus;Piecemeal swallowing;Decreased bolus cohesion Oral - Nectar Straw -- Oral - Thin Teaspoon -- Oral - Thin Cup Right anterior bolus loss;Weak lingual manipulation;Lingual pumping;Reduced posterior propulsion;Right pocketing in lateral sulci;Pocketing in anterior sulcus;Piecemeal swallowing;Decreased bolus cohesion Oral - Thin Straw Right anterior bolus loss;Weak lingual manipulation;Lingual pumping;Reduced posterior propulsion;Right pocketing in lateral sulci;Pocketing in anterior sulcus;Piecemeal swallowing;Decreased bolus cohesion Oral - Puree -- Oral - Mech Soft -- Oral - Regular -- Oral - Multi-Consistency -- Oral - Pill -- Oral Phase - Comment --  CHL IP PHARYNGEAL PHASE 02/22/2019 Pharyngeal Phase Impaired Pharyngeal- Pudding Teaspoon -- Pharyngeal -- Pharyngeal- Pudding Cup -- Pharyngeal -- Pharyngeal- Honey Teaspoon -- Pharyngeal -- Pharyngeal- Honey Cup -- Pharyngeal -- Pharyngeal- Nectar Teaspoon -- Pharyngeal -- Pharyngeal- Nectar Cup Delayed swallow initiation-pyriform sinuses;Reduced pharyngeal peristalsis;Reduced epiglottic inversion;Reduced anterior laryngeal mobility;Reduced laryngeal elevation;Reduced airway/laryngeal closure;Reduced tongue base retraction;Penetration/Aspiration before swallow;Penetration/Aspiration during swallow;Penetration/Apiration after swallow;Pharyngeal residue - valleculae Pharyngeal Material enters airway, passes BELOW cords and not ejected out despite cough attempt by patient Pharyngeal- Nectar Straw Delayed swallow initiation-pyriform sinuses;Reduced  pharyngeal peristalsis;Reduced epiglottic inversion;Reduced anterior laryngeal mobility;Reduced laryngeal elevation;Reduced airway/laryngeal closure;Reduced tongue base retraction;Penetration/Aspiration before swallow;Penetration/Aspiration during swallow;Penetration/Apiration after swallow;Pharyngeal residue - valleculae Pharyngeal Material enters airway, passes BELOW cords and not ejected out despite cough attempt by patient Pharyngeal- Thin Teaspoon -- Pharyngeal -- Pharyngeal- Thin Cup Delayed swallow initiation-pyriform sinuses;Reduced pharyngeal peristalsis;Reduced epiglottic inversion;Reduced anterior laryngeal mobility;Reduced laryngeal elevation;Reduced airway/laryngeal closure;Reduced tongue base retraction;Penetration/Aspiration before swallow;Penetration/Aspiration during swallow;Penetration/Apiration after swallow;Pharyngeal residue - valleculae Pharyngeal Material enters airway, passes BELOW cords and not ejected out despite cough attempt by patient Pharyngeal- Thin Straw Delayed swallow initiation-pyriform sinuses;Reduced pharyngeal peristalsis;Reduced epiglottic inversion;Reduced anterior laryngeal mobility;Reduced laryngeal elevation;Reduced airway/laryngeal closure;Reduced tongue base retraction;Penetration/Aspiration before swallow;Penetration/Aspiration during swallow;Penetration/Apiration after swallow;Pharyngeal residue - valleculae Pharyngeal Material enters airway, passes BELOW cords and not ejected out despite cough attempt by patient Pharyngeal- Puree -- Pharyngeal -- Pharyngeal- Mechanical Soft -- Pharyngeal -- Pharyngeal- Regular -- Pharyngeal -- Pharyngeal- Multi-consistency -- Pharyngeal -- Pharyngeal- Pill -- Pharyngeal -- Pharyngeal Comment --  No flowsheet data found. Blenda Mounts Laurice 02/22/2019, 2:58 PM  ECHOCARDIOGRAM COMPLETE  Result Date: 02/20/2019   ECHOCARDIOGRAM REPORT   Patient Name:   Tyrone Cunningham Date of Exam: 02/20/2019 Medical Rec #:  [id number]   Height:       67.0 in Accession #:    [id number] Weight:       270.0 lb Date of Birth:  04/12/1955   BSA:          2.30 m Patient Age:    63 years   BP:           88/67 mmHg Patient Gender: M          HR:           97 bpm. Exam Location:  Inpatient Procedure: 2D Echo, Color Doppler, Cardiac Doppler and Intracardiac            Opacification Agent Indications:    Stroke  History:        Patient has prior history of Echocardiogram examinations, most                 recent 04/25/2015. CHF, COPD; Risk Factors:Hypertension,                 Diabetes, Dyslipidemia and Sleep Apnea.  Sonographer:    Irving Burton Senior RDCS Referring Phys: [id number] Emeline General  Sonographer Comments: Technically challenging study due to poor, limited acoustic windows and patient is morbidly obese. IMPRESSIONS  1. Left ventricular ejection fraction, by visual estimation, is 55 to 60%. The left ventricle has normal function. There is no left ventricular hypertrophy.  2. Definity contrast agent was given IV to delineate the left ventricular endocardial borders.  3. Left ventricular diastolic function could not be evaluated.  4. The left ventricle has no regional wall motion abnormalities.  5. Global right ventricle was not well visualized.The right ventricular size is not well visualized. Right vetricular wall thickness was not assessed.  6. Left atrial size was not well visualized.  7. Right atrial size was not well visualized.  8. The mitral valve was not well visualized. No evidence of mitral valve regurgitation.  9. The tricuspid valve is not well visualized. Tricuspid valve regurgitation is not demonstrated. 10. The aortic valve was not well visualized. Aortic valve regurgitation is not visualized. 11. The pulmonic valve was not well visualized. Pulmonic valve regurgitation is not visualized. 12. The aortic root was not well visualized. 13. Very technically difficult study, even with use of echo contrast. Contrast shows normal EF without significant  wall motion abnormalities. Limited ability to assess any other features due to body habitus and limited echo windows. 14. The interatrial septum was not well visualized. FINDINGS  Left Ventricle: Left ventricular ejection fraction, by visual estimation, is 55 to 60%. The left ventricle has normal function. Definity contrast agent was given IV to delineate the left ventricular endocardial borders. The left ventricle has no regional wall motion abnormalities. There is no left ventricular hypertrophy. Left ventricular diastolic function could not be evaluated. Right Ventricle: The right ventricular size is not well visualized. Right vetricular wall thickness was not assessed. Global RV systolic function is was not well visualized. Left Atrium: Left atrial size was not well visualized. Right Atrium: Right atrial size was not well visualized Pericardium: There is no evidence of pericardial effusion. Mitral Valve: The mitral valve was not well visualized. No evidence of mitral valve regurgitation. Tricuspid Valve: The tricuspid valve is not well visualized. Tricuspid valve regurgitation is not demonstrated. Aortic Valve: The aortic valve was not  well visualized. Aortic valve regurgitation is not visualized. Pulmonic Valve: The pulmonic valve was not well visualized. Pulmonic valve regurgitation is not visualized. Pulmonic regurgitation is not visualized. Aorta: The aortic root was not well visualized. Venous: The inferior vena cava was not well visualized. IAS/Shunts: The interatrial septum was not well visualized.  RIGHT VENTRICLE RV S prime:     12.60 cm/s TAPSE (M-mode): 2.1 cm LEFT ATRIUM             Index       RIGHT ATRIUM           Index LA Vol (A2C):   26.7 ml 11.62 ml/m RA Area:     16.40 cm LA Vol (A4C):   40.4 ml 17.59 ml/m RA Volume:   43.30 ml  18.85 ml/m LA Biplane Vol: 33.6 ml 14.63 ml/m  AORTIC VALVE LVOT Vmax:   64.00 cm/s LVOT Vmean:  46.400 cm/s LVOT VTI:    0.117 m  SHUNTS Systemic VTI: 0.12 m   Jodelle Red MD Electronically signed by Jodelle Red MD Signature Date/Time: 02/20/2019/4:51:27 PM    Final      Antimicrobials:   None currently    Subjective: No acute events, pt minimally responsive  Objective: Vitals:   03/12/19 0443 03/12/19 0744 03/12/19 0824 03/12/19 0845  BP:  (!) 152/66    Pulse: 83 84    Resp: 16 20    Temp:  98.3 F (36.8 C)    TempSrc:  Oral    SpO2: 93%  95% 95%  Weight:      Height:        Intake/Output Summary (Last 24 hours) at 03/12/2019 1136 Last data filed at 03/12/2019 0353 Gross per 24 hour  Intake 530.57 ml  Output 300 ml  Net 230.57 ml   Filed Weights   03/08/19 0419 03/09/19 0331 03/12/19 0311  Weight: 114 kg 114.5 kg 116.1 kg    Examination:  General exam: minimally interactive, trach in place  Respiratory system: Clear to auscultation. Respiratory effort normal. Cardiovascular system: S1 & S2 heard, RRR. No JVD, murmurs, rubs, gallops or clicks. No pedal edema. Gastrointestinal system: Abdomen is nondistended, soft and nontender. No organomegaly or masses felt. Normal bowel sounds heard. Central nervous system: Alert Extremities:no pedal edema Skin: No rashes, lesions or ulcers Psychiatry: Judgement and insight impaired    Data Reviewed: I have personally reviewed following labs and imaging studies  CBC: Recent Labs  Lab 03/06/19 0540 03/07/19 0236 03/10/19 0558  WBC 13.5* 11.2* 9.9  HGB 14.5 14.0 14.5  HCT 45.5 43.7 45.1  MCV 94.8 93.8 94.0  PLT 227 229 240   Basic Metabolic Panel: Recent Labs  Lab 03/06/19 0540 03/10/19 0454  NA 142 143  K 3.8 4.3  CL 98 97*  CO2 32 34*  GLUCOSE 122* 83  BUN 12 22  CREATININE 0.85 1.20  CALCIUM 8.9 8.2*  MG  --  2.2   GFR: Estimated Creatinine Clearance: 74.7 mL/min (by C-G formula based on SCr of 1.2 mg/dL). Liver Function Tests: No results for input(s): AST, ALT, ALKPHOS, BILITOT, PROT, ALBUMIN in the last 168 hours. No results for  input(s): LIPASE, AMYLASE in the last 168 hours. No results for input(s): AMMONIA in the last 168 hours. Coagulation Profile: No results for input(s): INR, PROTIME in the last 168 hours. Cardiac Enzymes: No results for input(s): CKTOTAL, CKMB, CKMBINDEX, TROPONINI in the last 168 hours. BNP (last 3 results) No results for input(s): PROBNP in the  last 8760 hours. HbA1C: No results for input(s): HGBA1C in the last 72 hours. CBG: Recent Labs  Lab 03/11/19 1612 03/11/19 1957 03/11/19 2357 03/12/19 0313 03/12/19 0741  GLUCAP 91 120* 109* 104* 122*   Lipid Profile: No results for input(s): CHOL, HDL, LDLCALC, TRIG, CHOLHDL, LDLDIRECT in the last 72 hours. Thyroid Function Tests: No results for input(s): TSH, T4TOTAL, FREET4, T3FREE, THYROIDAB in the last 72 hours. Anemia Panel: No results for input(s): VITAMINB12, FOLATE, FERRITIN, TIBC, IRON, RETICCTPCT in the last 72 hours. Sepsis Labs: Recent Labs  Lab 03/06/19 0540 03/07/19 0236  PROCALCITON 0.22 0.23    Recent Results (from the past 240 hour(s))  Culture, respiratory     Status: None   Collection Time: 03/03/19  7:18 PM   Specimen: Tracheal Aspirate  Result Value Ref Range Status   Specimen Description TRACHEAL ASPIRATE  Final   Special Requests NONE  Final   Gram Stain   Final    FEW SQUAMOUS EPITHELIAL CELLS PRESENT ABUNDANT WBC PRESENT, PREDOMINANTLY PMN ABUNDANT GRAM POSITIVE RODS RARE GRAM POSITIVE COCCI Performed at [facility], [address] [place], [place] [address]    Culture ABUNDANT STAPHYLOCOCCUS EPIDERMIDIS  Final   Report Status 03/05/2019 FINAL  Final   Organism ID, Bacteria STAPHYLOCOCCUS EPIDERMIDIS  Final      Susceptibility   Staphylococcus epidermidis - MIC*    CIPROFLOXACIN 1 SENSITIVE Sensitive     ERYTHROMYCIN >=8 RESISTANT Resistant     GENTAMICIN <=0.5 SENSITIVE Sensitive     OXACILLIN >=4 RESISTANT Resistant     TETRACYCLINE >=16 RESISTANT Resistant     VANCOMYCIN 1  SENSITIVE Sensitive     TRIMETH/SULFA 160 RESISTANT Resistant     CLINDAMYCIN <=0.25 SENSITIVE Sensitive     RIFAMPIN <=0.5 SENSITIVE Sensitive     Inducible Clindamycin NEGATIVE Sensitive     * ABUNDANT STAPHYLOCOCCUS EPIDERMIDIS         Radiology Studies: DG CHEST PORT 1 VIEW  Result Date: 03/10/2019 CLINICAL DATA:  Dyspnea. EXAM: PORTABLE CHEST 1 VIEW COMPARISON:  March 05, 2019 FINDINGS: There is stable tracheostomy tube positioning. A stable area of hazy opacification is seen within the right lung base. The left lung remains clear. The heart size and mediastinal contours are within normal limits. The visualized skeletal structures are unremarkable. IMPRESSION: 1. Stable area of hazy right basilar opacification likely consistent with an area of atelectasis and/or infiltrate. An associated pleural effusion cannot be excluded. Electronically Signed   By: Aram Candelahaddeus  Houston M.D.   On: 03/10/2019 18:58        Scheduled Meds: .  stroke: mapping our early stages of recovery book   Does not apply Once  . digoxin  0.25 mg Oral Daily  . feeding supplement (ENSURE ENLIVE)  237 mL Oral BID BM  . insulin glargine  10 Units Subcutaneous Daily  . ipratropium-albuterol  3 mL Nebulization BID  .  morphine injection  2 mg Intravenous Q6H  . Resource ThickenUp Clear   Oral Once  . sodium chloride flush  10-40 mL Intracatheter Q12H   Continuous Infusions: . sodium chloride Stopped (02/28/19 2112)  . dextrose 25 mL/hr at 03/11/19 1125  . valproate sodium 500 mg (03/12/19 0501)     LOS: 21 days    Time spent: 35  min    Burke Keelshristopher Jeramiah Mccaughey, MD [facility]  If 7PM-7AM, please contact night-coverage  03/12/2019, 11:36 AM

## 2019-03-12 NOTE — Progress Notes (Addendum)
Palliative follow up.  Talked with brother Tyrone Cunningham this am.  He conferred with the rest of the family last evening.  They feel comfort measures and [facility] are most appropriate for Tyrone Cunningham at this point.  Talked with Tyrone Cunningham about [facility], answered questions regarding brother's health status, provided reassurance that this is the best possible decision the family could make for Tyrone Cunningham at this point.  Tyrone Cunningham is very comfortable that when Tyrone Cunningham is with the Tyrone Cunningham he will be happy and healed and the bad health he has gone thru will be over.  Code status discussed and changed to DNR.  Explained to Tyrone Cunningham that in order to go to [facility] he would need to be a DNR.  Tyrone Cunningham expressed that he felt that DNR was very appropriate.  "It would be horrible to make his suffer at the end of his life"  TOC order for [facility] placed.  1st choice is [place].  2nd Choice is [place].  Patient has family in both cities. Patient's orders adjusted to add comfort meds as needed and eliminate medications that will not benefit him at this point. Family contact is brother Tyrone Cunningham.  Discussed with Tyrone Cunningham of [facility].  Norvel Richards, PA-C Palliative Medicine Office:  [phone]  35 min.

## 2019-03-13 MED ORDER — INSULIN GLARGINE 100 UNIT/ML ~~LOC~~ SOLN: 5.0000 [IU] | Freq: Every day | SUBCUTANEOUS | Status: DC

## 2019-03-13 NOTE — Discharge Summary (Signed)
Physician Discharge Summary  Tyrone Cunningham NWG:[id number] DOB: 02/04/1956 DOA: 02/19/2019  PCP: System, Pcp Not In  Admit date: 02/19/2019 Discharge date: 03/13/2019  Admitted From: Inpatient Disposition: hospice house  1.   Home Health:No Equipment/Devices:none  Discharge Condition:Serious CODE STATUS:DNR Diet recommendation: for comfort as able  Brief/Interim Summary: Tyrone LericheMark Smithis an 64 y.o.malepast medical history significant for chronic systolic heart failure, diabetes mellitus type 2, chronic pain syndrome, chronic trach [facility] of confusion per nursing home staff he had a faint right facial droop and right-sided weakness last known normal was the night before admission  Moving to comfort care with hospice, transition to hospice house for EOL care, was being treated as follows:  Acute CVA: With associated right hemiplegia and right facial droop MRI of the brain showed acute left pontine infarct to the PCA territory. Neurology was consulted recommended DTaP for 3 months followed by aspirin alone. Physical therapy evaluated the patient recommended skilled nursing facility, continue Lipitor.  Dysphagia: Likely due to CVA, NG tube was placed patient pulled it out and refused for it to be reinserted. MBS on 1228 was favorable he was started on a dysphagia 1 diet per started aspirating again. Repeated MBS on 03/07/2018 showed significant aspiration now n.p.o. The previous physician spoke with the brother regarding a PEG tube placement versus comfort feeds stable.No undecided about PEG tube or moving towards comfort care. Palliative care will speak to the family about PEG and direction of care.  OSA/acute on chronic respiratory failure with hypoxia and hypercapnia: Patient is status post trach recently treated for left lower lobe pneumonia and has completed his course of antibiotics. Continue oxygen.  Acute metabolic  encephalopathy: He waxes and wanes need is resolving slowly.  Essential hypertension: Continue lisinopril and metoprolol.  Sepsis secondary to right lower lobe pneumonia: Treated empirically with antibiotics is completed course.  Abdominal pain: Unremarkable.  Hypernatremia: Nowresolved with free water.  1 out of 4 blood cultures positive for GPC: Likely a contaminant.  Chronic diastolic heart failure: Continue digoxin.  Diabetes mellitus type 2: Insulin-dependent, with a last A1c of 8.3, continue long-acting insulin plus sliding scale.  Incidental finding of lung nodule: 4 mm in size will need follow-up as an outpatient with PCP  Goals of care: Palliative care services involved.   Discharge Diagnoses:  Principal Problem:   Stroke (cerebrum) (HCC) Active Problems:   Hypertension   Tracheostomy dependent (HCC)   DM (diabetes mellitus), type 2 with neurological complications [facility](HCC)   Palliative care encounter   Chronic diastolic CHF (congestive heart failure) (HCC)   Hemiparesis of right dominant side (HCC)   Aspiration pneumonia of left lung (HCC)   Aspiration into airway   Acute on chronic respiratory failure (HCC)   DNR (do not resuscitate)    Discharge Instructions  Discharge Instructions    Diet - low sodium heart healthy   Complete by: As directed    Increase activity slowly   Complete by: As directed      Allergies as of 03/13/2019   No Known Allergies     Medication List    STOP taking these medications   budesonide 0.5 MG/2ML nebulizer solution Commonly known as: PULMICORT   clobetasol cream 0.05 % Commonly known as: TEMOVATE   clonazePAM 0.5 MG tablet Commonly known as: KLONOPIN   digoxin 0.25 MG tablet Commonly known as: LANOXIN   docusate sodium 100 MG capsule Commonly known as: COLACE   FLUoxetine 20  MG tablet Commonly known as: PROZAC   ipratropium-albuterol 0.5-2.5 (3) MG/3ML Soln Commonly known as: DUONEB    lisinopril 10 MG tablet Commonly known as: ZESTRIL   magnesium oxide 400 MG tablet Commonly known as: MAG-OX   metoprolol tartrate 25 MG tablet Commonly known as: LOPRESSOR   pantoprazole sodium 40 mg/20 mL Pack Commonly known as: PROTONIX   triamcinolone cream 0.5 % Commonly known as: KENALOG     TAKE these medications   divalproex 500 MG DR tablet Commonly known as: DEPAKOTE Take 500 mg by mouth 3 (three) times daily.   gabapentin 100 MG capsule Commonly known as: NEURONTIN Take 200 mg by mouth every 12 (twelve) hours.   insulin glargine 100 UNIT/ML injection Commonly known as: LANTUS Inject 20 Units into the skin daily.   QUEtiapine 200 MG tablet Commonly known as: SEROQUEL Take 200 mg by mouth at bedtime.       No Known Allergies  Consultations:  palliative care   Procedures/Studies: CT Angio Head W or Wo Contrast  Result Date: 02/19/2019 CLINICAL DATA:  Right upper and lower extremity weakness. EXAM: CT ANGIOGRAPHY HEAD AND NECK TECHNIQUE: Multidetector CT imaging of the head and neck was performed using the standard protocol during bolus administration of intravenous contrast. Multiplanar CT image reconstructions and MIPs were obtained to evaluate the vascular anatomy. Carotid stenosis measurements (when applicable) are obtained utilizing NASCET criteria, using the distal internal carotid diameter as the denominator. CONTRAST:  60mL OMNIPAQUE IOHEXOL 350 MG/ML SOLN COMPARISON:  None. FINDINGS: CTA NECK FINDINGS Aortic arch: Standard 3 vessel aortic arch with moderate atherosclerotic plaque. No significant arch vessel origin stenosis. Right carotid system: Patent with scattered mild, predominantly calcified plaque in the common carotid and proximal internal carotid arteries. No evidence of significant stenosis or dissection. Left carotid system: Patent with scattered calcified and soft plaque in the common carotid artery and moderate, predominantly calcified  plaque at the carotid bifurcation and in the carotid bulb. No evidence of significant stenosis or dissection. Vertebral arteries: Patent with the right being mildly dominant. Calcified plaque at the vertebral artery origins results in severe stenosis on the right and mild stenosis on the left. Skeleton: Left canal wall up mastoidectomy. Congenital C2-3 fusion. Severe right facet arthrosis at C3-4. Other neck: No evidence of cervical lymphadenopathy or mass. Upper chest: Mild mosaic attenuation in the lung apices, possibly air trapping. Scattered small nodules in both lung apices with the largest measuring 4 mm in the right upper lobe (series 5, image 180). Review of the MIP images confirms the above findings CTA HEAD FINDINGS Anterior circulation: The internal carotid arteries are patent from skull base to carotid termini with calcified plaque resulting in mild-to-moderate left greater than right cavernous and mild left paraclinoid stenoses. ACAs and MCAs are patent with moderate distal branch vessel irregular narrowing but no evidence of proximal branch occlusion or significant proximal stenosis. No aneurysm is identified. Posterior circulation: The intracranial vertebral arteries are widely patent to the basilar. Patent right AICA and bilateral SCA origins are visualized. PICAs and a left AICA are not clearly identified. The basilar artery is patent with a mild-to-moderate stenosis in its midportion. There is a medium-sized left posterior communicating artery. Both PCAs are attenuated with moderate to severe P1 and severe P2 stenoses bilaterally. No aneurysm is identified. Venous sinuses: As permitted by contrast timing, patent. Anatomic variants: None. Review of the MIP images confirms the above findings IMPRESSION: 1. No emergent large vessel occlusion. 2. Age advanced intracranial atherosclerosis  including moderate to severe bilateral PCA stenoses in the setting of chronic bilateral PCA infarcts. 3.  Mild-to-moderate basilar artery and bilateral intracranial ICA stenoses. 4. Severe right vertebral artery origin stenosis. 5. Small biapical lung nodules measuring up to 4 mm. No follow-up needed if patient is low-risk (and has no known or suspected primary neoplasm). Non-contrast chest CT can be considered in 12 months if patient is high-risk. This recommendation follows the consensus statement: Guidelines for Management of Incidental Pulmonary Nodules Detected on CT Images: From the Fleischner Society 2017; Radiology 2017; 284:228-243. 6. Aortic Atherosclerosis (ICD10-I70.0). Electronically Signed   By: Sebastian Ache M.D.   On: 02/19/2019 17:03   CT HEAD WO CONTRAST  Result Date: 02/21/2019 CLINICAL DATA:  Stroke follow-up EXAM: CT HEAD WITHOUT CONTRAST TECHNIQUE: Contiguous axial images were obtained from the base of the skull through the vertex without intravenous contrast. COMPARISON:  CT head 02/19/2019, 11/26/2018 FINDINGS: Brain: Chronic right PCA infarct unchanged. This involves the right occipital lobe extending into the medial temporal lobe. Chronic left PICA infarct unchanged Hypodensity in the left occipital lobe is ill-defined and appears larger compared with 11/26/2018. No change from 02/19/2019. This is most compatible with subacute extension of infarct in the left PCA territory. Generalized atrophy. Negative for hydrocephalus. No acute hemorrhage or mass. Vascular: Negative for hyperdense vessel Skull: Negative Sinuses/Orbits: Chronic infarct left medial orbit. Mild mucosal edema in the paranasal sinuses. Negative orbit bilaterally. Other: None IMPRESSION: Left occipital hypodensity appears ill-defined and has progressed since 11/26/2018. No change from 02/19/2019. Findings compatible with subacute extension of left PCA infarct Chronic infarct right PCA territory unchanged. Chronic left PICA infarct unchanged. Negative for acute hemorrhage. Electronically Signed   By: Marlan Palau M.D.   On:  02/21/2019 09:14   CT HEAD WO CONTRAST  Result Date: 02/19/2019 CLINICAL DATA:  Right-sided weakness EXAM: CT HEAD WITHOUT CONTRAST TECHNIQUE: Contiguous axial images were obtained from the base of the skull through the vertex without intravenous contrast. COMPARISON:  November 26, 2018. FINDINGS: Brain: No evidence of acute territorial infarction, hemorrhage, hydrocephalus,extra-axial collection or mass lesion/mass effect. There is dilatation the ventricles and sulci consistent with age-related atrophy. Low-attenuation changes in the deep white matter consistent with small vessel ischemia. Again noted is extensive areas of encephalomalacia involving the bilateral posterior parietooccipital lobes with continued evolution from the prior exam. Vascular: No hyperdense vessel or unexpected calcification. Skull: The skull is intact. No fracture or focal lesion identified. Sinuses/Orbits: The visualized paranasal sinuses and mastoid air cells are clear. The orbits and globes intact. Other: None IMPRESSION: Findings consistent with age related atrophy and chronic small vessel ischemia Continued evolution of encephalomalacia in the bilateral posterior parieto-occipital lobes. Electronically Signed   By: Jonna Clark M.D.   On: 02/19/2019 15:47   CT Angio Neck W and/or Wo Contrast  Result Date: 02/19/2019 CLINICAL DATA:  Right upper and lower extremity weakness. EXAM: CT ANGIOGRAPHY HEAD AND NECK TECHNIQUE: Multidetector CT imaging of the head and neck was performed using the standard protocol during bolus administration of intravenous contrast. Multiplanar CT image reconstructions and MIPs were obtained to evaluate the vascular anatomy. Carotid stenosis measurements (when applicable) are obtained utilizing NASCET criteria, using the distal internal carotid diameter as the denominator. CONTRAST:  47mL OMNIPAQUE IOHEXOL 350 MG/ML SOLN COMPARISON:  None. FINDINGS: CTA NECK FINDINGS Aortic arch: Standard 3 vessel  aortic arch with moderate atherosclerotic plaque. No significant arch vessel origin stenosis. Right carotid system: Patent with scattered mild, predominantly calcified plaque in  the common carotid and proximal internal carotid arteries. No evidence of significant stenosis or dissection. Left carotid system: Patent with scattered calcified and soft plaque in the common carotid artery and moderate, predominantly calcified plaque at the carotid bifurcation and in the carotid bulb. No evidence of significant stenosis or dissection. Vertebral arteries: Patent with the right being mildly dominant. Calcified plaque at the vertebral artery origins results in severe stenosis on the right and mild stenosis on the left. Skeleton: Left canal wall up mastoidectomy. Congenital C2-3 fusion. Severe right facet arthrosis at C3-4. Other neck: No evidence of cervical lymphadenopathy or mass. Upper chest: Mild mosaic attenuation in the lung apices, possibly air trapping. Scattered small nodules in both lung apices with the largest measuring 4 mm in the right upper lobe (series 5, image 180). Review of the MIP images confirms the above findings CTA HEAD FINDINGS Anterior circulation: The internal carotid arteries are patent from skull base to carotid termini with calcified plaque resulting in mild-to-moderate left greater than right cavernous and mild left paraclinoid stenoses. ACAs and MCAs are patent with moderate distal branch vessel irregular narrowing but no evidence of proximal branch occlusion or significant proximal stenosis. No aneurysm is identified. Posterior circulation: The intracranial vertebral arteries are widely patent to the basilar. Patent right AICA and bilateral SCA origins are visualized. PICAs and a left AICA are not clearly identified. The basilar artery is patent with a mild-to-moderate stenosis in its midportion. There is a medium-sized left posterior communicating artery. Both PCAs are attenuated with moderate  to severe P1 and severe P2 stenoses bilaterally. No aneurysm is identified. Venous sinuses: As permitted by contrast timing, patent. Anatomic variants: None. Review of the MIP images confirms the above findings IMPRESSION: 1. No emergent large vessel occlusion. 2. Age advanced intracranial atherosclerosis including moderate to severe bilateral PCA stenoses in the setting of chronic bilateral PCA infarcts. 3. Mild-to-moderate basilar artery and bilateral intracranial ICA stenoses. 4. Severe right vertebral artery origin stenosis. 5. Small biapical lung nodules measuring up to 4 mm. No follow-up needed if patient is low-risk (and has no known or suspected primary neoplasm). Non-contrast chest CT can be considered in 12 months if patient is high-risk. This recommendation follows the consensus statement: Guidelines for Management of Incidental Pulmonary Nodules Detected on CT Images: From the Fleischner Society 2017; Radiology 2017; 284:228-243. 6. Aortic Atherosclerosis (ICD10-I70.0). Electronically Signed   By: Sebastian Ache M.D.   On: 02/19/2019 17:03   MR BRAIN WO CONTRAST  Result Date: 02/22/2019 CLINICAL DATA:  Follow-up stroke. EXAM: MRI HEAD WITHOUT CONTRAST TECHNIQUE: Multiplanar, multiecho pulse sequences of the brain and surrounding structures were obtained without intravenous contrast. COMPARISON:  CT head 02/21/2019 FINDINGS: Brain: Acute infarct left PCA territory involving the left hippocampus with extension into the left lower occipital lobe. This corresponds to the hypodensity on CT. Acute infarct in the left pons, 10 x 15 mm. Chronic infarct right occipital lobe. Generalized atrophy. Negative for hemorrhage. Vascular: Normal arterial flow voids Skull and upper cervical spine: Negative Sinuses/Orbits: Negative Other: Incomplete study.  Patient not able to initial sequences. IMPRESSION: Acute infarct left PCA territory involving the hippocampus and occipital lobe Acute infarct left pons. Chronic  infarct right occipital lobe. Incomplete study, degraded by motion. Electronically Signed   By: Marlan Palau M.D.   On: 02/22/2019 14:48   [place] CHEST (PLEURAL EFFUSION)  Result Date: 03/05/2019 CLINICAL DATA:  Acute on chronic respiratory failure. Evaluate for left-sided pleural effusion and perform ultrasound-guided thoracentesis as indicated.  EXAM: CHEST ULTRASOUND COMPARISON:  Chest radiograph-03/05/2019 FINDINGS: Sonographic evaluation of the left chest is negative for any significant pleural fluid. As such, ultrasound-guided thoracentesis was not attempted. IMPRESSION: No significant left-sided pleural effusion. Ultrasound-guided thoracentesis was not attempted. Electronically Signed   By: Simonne Come M.D.   On: 03/05/2019 10:01   DG CHEST PORT 1 VIEW  Result Date: 03/10/2019 CLINICAL DATA:  Dyspnea. EXAM: PORTABLE CHEST 1 VIEW COMPARISON:  March 05, 2019 FINDINGS: There is stable tracheostomy tube positioning. A stable area of hazy opacification is seen within the right lung base. The left lung remains clear. The heart size and mediastinal contours are within normal limits. The visualized skeletal structures are unremarkable. IMPRESSION: 1. Stable area of hazy right basilar opacification likely consistent with an area of atelectasis and/or infiltrate. An associated pleural effusion cannot be excluded. Electronically Signed   By: Aram Candela M.D.   On: 03/10/2019 18:58   DG CHEST PORT 1 VIEW  Result Date: 03/06/2019 CLINICAL DATA:  Aspiration into airway. EXAM: PORTABLE CHEST 1 VIEW COMPARISON:  Radiograph earlier this day FINDINGS: Tracheostomy tube tip at the thoracic inlet. Similar hazy opacity at the right lung base likely combination of airspace disease and pleural fluid. Left lung appears clear. Unchanged heart size and mediastinal contours. No pneumothorax. Multiple overlying monitoring devices. IMPRESSION: 1. No significant change from earlier this day. Unchanged hazy opacity at the  right lung base, likely combination of airspace disease and pleural fluid. 2. Tracheostomy tube tip at the thoracic inlet. Electronically Signed   By: Narda Rutherford M.D.   On: 03/06/2019 01:07   DG CHEST PORT 1 VIEW  Result Date: 03/05/2019 CLINICAL DATA:  Acute on chronic respiratory failure. EXAM: PORTABLE CHEST 1 VIEW COMPARISON:  March 04, 2019. FINDINGS: Stable cardiomediastinal silhouette. Tracheostomy tube is unchanged in position. No pneumothorax is noted. Left lung is clear. Mild right pleural effusion is noted with associated atelectasis or infiltrate. Bony thorax is unremarkable. IMPRESSION: Stable mild right pleural effusion with associated atelectasis or infiltrate. Stable tracheostomy tube. No pneumothorax is noted. Electronically Signed   By: Lupita Raider M.D.   On: 03/05/2019 09:27   DG CHEST PORT 1 VIEW  Result Date: 03/04/2019 CLINICAL DATA:  Reason for exam: acute on chronic respiratory failure Patient non verbal throughout exam. Showed difficulty holding still for images. Hx of chf, bronchitis, copd, diabetes, htn. Hx alveoloplasty 2014, tracheostomy. Quit smoking 2001. EXAM: PORTABLE CHEST 1 VIEW COMPARISON:  Chest radiograph 02/21/2019 FINDINGS: Stable cardiomediastinal contours. Tracheostomy in place. Moderate size right layering pleural effusion. Persistent right basilar consolidative opacity could reflect atelectasis or infection. Left lung is clear. No pneumothorax. IMPRESSION: Small to moderate size right pleural effusion with persistent right basilar opacity possibly atelectasis or infection. Electronically Signed   By: Emmaline Kluver M.D.   On: 03/04/2019 11:40   DG CHEST PORT 1 VIEW  Result Date: 02/21/2019 CLINICAL DATA:  Altered mental status. History of bronchitis, congestive heart failure, COPD, diabetes, hypertension and sleep apnea. EXAM: PORTABLE CHEST 1 VIEW COMPARISON:  Radiographs 02/19/2019 and 10/19/2017. FINDINGS: 1159 hours. The tracheostomy appears  unchanged. The heart size and mediastinal contours are stable with aortic atherosclerosis. Interval increased opacity at the right lung base which may reflect worsening atelectasis or developing infiltrate. Probable mild left basilar scarring is unchanged. There may be a small amount of pleural fluid on the right. No pneumothorax or edema. The bones appear stable. IMPRESSION: 1. Increased right basilar opacity may reflect worsening atelectasis or developing  infiltrate. Possible small right pleural effusion. 2. No other significant changes. Electronically Signed   By: Carey Bullocks M.D.   On: 02/21/2019 12:23   DG Chest Portable 1 View  Result Date: 02/19/2019 CLINICAL DATA:  Altered mental status. Tracheostomy patient. History of hypertension, congestive heart failure, diabetes and COPD. EXAM: PORTABLE CHEST 1 VIEW COMPARISON:  Radiographs 10/19/2017 and 10/12/2017. FINDINGS: 1656 hours. Tracheostomy appears well positioned. The heart size and mediastinal contours are stable. There is interval improved aeration of the right lung base with probable mild chronic basilar scarring. No new airspace disease, edema, pleural effusion or pneumothorax. The bones appear unchanged. Telemetry leads overlie the chest. IMPRESSION: Interval improved aeration of the right lung base. No acute cardiopulmonary process. Electronically Signed   By: Carey Bullocks M.D.   On: 02/19/2019 17:13   DG Abd Portable 1V  Result Date: 03/07/2019 CLINICAL DATA:  Abdominal pain. EXAM: PORTABLE ABDOMEN - 1 VIEW COMPARISON:  February 28, 2019 FINDINGS: The bowel gas pattern is normal. Radiopaque surgical clips are seen overlying the right upper quadrant. No radio-opaque calculi or other significant radiographic abnormality are seen. IMPRESSION: Negative. Electronically Signed   By: Aram Candela M.D.   On: 03/07/2019 19:22   DG Abd Portable 1V  Result Date: 02/28/2019 CLINICAL DATA:  Abdominal distention EXAM: PORTABLE ABDOMEN - 1  VIEW COMPARISON:  02/23/2019 FINDINGS: Prior cholecystectomy. Nonobstructive bowel gas pattern. No organomegaly or free air. IMPRESSION: No acute findings.  No evidence of bowel obstruction. Electronically Signed   By: Charlett Nose M.D.   On: 02/28/2019 12:20   DG Abd Portable 1V  Result Date: 02/23/2019 CLINICAL DATA:  Nasogastric tube placement EXAM: PORTABLE ABDOMEN - 1 VIEW COMPARISON:  Portable exam 1709 hours compared to 1525 hours FINDINGS: Tip of nasogastric tube projects over stomach, proximal side-port near the GE junction. Volume loss in the RIGHT hemithorax with mediastinal shift to RIGHT. RIGHT pleural effusion and basilar atelectasis. LEFT lung base clear. IMPRESSION: Tip of nasogastric tube projects over proximal stomach with proximal side-port near the GE junction. Electronically Signed   By: Ulyses Southward M.D.   On: 02/23/2019 17:41   DG Abd Portable 1V  Result Date: 02/23/2019 CLINICAL DATA:  NG tube placement. EXAM: PORTABLE ABDOMEN - 1 VIEW COMPARISON:  Chest radiograph 02/21/2019 FINDINGS: The patient is rotated to the right. A new enteric tube is in place with tip in the expected region of the GE junction or gastric cardia and side hole overlying the lower third of the thoracic esophagus. There is persistent right basilar lung opacity which may reflect a combination of airspace consolidation and pleural fluid, not grossly progressive from the previous chest radiograph. IMPRESSION: Enteric tube terminating in the region of the GE junction or gastric cardia. Advancement by 10 cm would ensure that the tip is well within the stomach and that the side hole is also in the stomach. These results will be called to the ordering clinician or representative by the Radiologist Assistant, and communication documented in the PACS or zVision Dashboard. Electronically Signed   By: Sebastian Ache M.D.   On: 02/23/2019 15:49   DG Swallowing Func-Speech Pathology  Result Date: 03/08/2019 Objective  Swallowing Evaluation: Type of Study: MBS-Modified Barium Swallow Study  Patient Details Name: Geovany Trudo MRN: [id number] Date of Birth: 10/06/1955 Today's Date: 03/08/2019 Time: SLP Start Time (ACUTE ONLY): 1129 -SLP Stop Time (ACUTE ONLY): 1153 SLP Time Calculation (min) (ACUTE ONLY): 24 min Past Medical History: Past Medical History: Diagnosis Date .  Bronchitis  . CHF (congestive heart failure) (HCC)   grade 1 diastolic with preserved EF in 1610 . COPD (chronic obstructive pulmonary disease) (HCC)  . Diabetes mellitus without complication (HCC)  . DM type 2 with diabetic peripheral neuropathy (HCC)  . Hearing loss  . Hypercholesteremia  . Hypertension  . Peripheral neuropathy  . Sleep apnea   Severe sleep apnea requiring tracheostomy Past Surgical History: Past Surgical History: Procedure Laterality Date . APPENDECTOMY   . CHOLECYSTECTOMY N/A 01/05/2014  Procedure: LAPAROSCOPIC CHOLECYSTECTOMY;  Surgeon: Emelia Loron, MD;  Location: [facility];  Service: General;  Laterality: N/A; . FEMUR HARDWARE REMOVAL  09/01/2003  Removal of retained hardware, two interlocking distal femoral . FEMUR SURGERY   . HAND SURGERY   . MULTIPLE EXTRACTIONS WITH ALVEOLOPLASTY N/A 05/11/2012  Procedure: MULTIPLE EXTRACTION WITH ALVEOLOPLASTY;  Surgeon: [name], DDS;  Location: [facility];  Service: Oral Surgery;  Laterality: N/A; . TRACHEOSTOMY   HPI: Tyrone Cunningham is a 64 y.o. male with medical history significant of CHF, diabetes, chronic pain syndrome, was brought to ED via EMS from [facility] nursing facility for evaluation of altered mental status. Per nursing home staff, pt appears to have right sided facial drooping, and right-sided weakness. 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  No previous ST notes in Epic.  Pt reported that he does not wear a PMV and usually finger occludes or mouths words to communicate.  MBS 02/22/19 with recs for NPO.  Repeat MBS 12/28 much improved; started on dys1, thin  liquids.  Pt had functional difficulty with thins at mealtime, so liquids were thickened to nectar and he appeared to tolerate much better.  Pt made NPO 1/3 after status change  Subjective: awake, alert, poor positioning, fair participation Assessment / Plan / Recommendation CHL IP CLINICAL IMPRESSIONS 03/08/2019 Clinical Impression Today's reassessment marks a decline in swallow function from MBSS on 12/28.  Study was completed with PMV in place for all bolus trials.  Pt presents with moderate-severe oropharyngeal dysphagia c/b lingual discoordination, premature spillage, delayed swallow initiation, decreased base of tongue retraction, incomplete laryngeal closure, reduced pharyngeal stripping wave, and diminished sensation.  These deficits resulted in penetration of all consistencies trialed which could not be cleared. Cued cough was weak and ineffective at clearing penetration when pt was able to follow commands.  There is suspected aspiration of thin liquid which was at least partially cleared by reflexive cough.  Pt had very poor positioning and visuzalization of pharynx, larynx, and trachea was impacted by body habitus and positioning.  Pt also had fair to poor participation in this study which prohibited trials of additional compensatory strategies and reduced the number of bolus trials. Based on pt's cognitive deficits and fluctuating mental stats, it is suspected that pt would not be able to consistently execute compensatory strategies. There was significant pharyngeal residue, with vallecular space filled and some spillover and unsensed penetration of residue.  Based on the results of this study, pt is at risk for aspiration with any PO diet.  Consider discussion around goals of care.  If agressive intervention is desired, recommend pt remain NPO with alternate means of nutrition, hydration, and medication. If oral feeding is pursued for comfort, with known risk of aspiration, consider puree and nectar thick  liquids. SLP Visit Diagnosis -- Attention and concentration deficit following -- Frontal lobe and executive function deficit following -- Impact on safety and function --   CHL IP TREATMENT RECOMMENDATION 03/08/2019 Treatment  Recommendations Therapy as outlined in treatment plan below   Prognosis 03/08/2019 Prognosis for Safe Diet Advancement Fair Barriers to Reach Goals Cognitive deficits;Language deficits;Severity of deficits Barriers/Prognosis Comment -- CHL IP DIET RECOMMENDATION 03/08/2019 SLP Diet Recommendations (No Data) Liquid Administration via Cup;Spoon Medication Administration Crushed with puree Compensations Slow rate;Small sips/bites;Minimize environmental distractions Postural Changes --   CHL IP OTHER RECOMMENDATIONS 03/08/2019 Recommended Consults -- Oral Care Recommendations Oral care QID Other Recommendations --   CHL IP FOLLOW UP RECOMMENDATIONS 03/08/2019 Follow up Recommendations Skilled Nursing facility   [facility] IP FREQUENCY AND DURATION 03/08/2019 Speech Therapy Frequency (ACUTE ONLY) min 2x/week Treatment Duration 2 weeks      CHL IP ORAL PHASE 03/08/2019 Oral Phase Impaired Oral - Pudding Teaspoon -- Oral - Pudding Cup -- Oral - Honey Teaspoon -- Oral - Honey Cup Right anterior bolus loss;Weak lingual manipulation;Holding of bolus;Reduced posterior propulsion;Piecemeal swallowing;Decreased bolus cohesion;Premature spillage Oral - Nectar Teaspoon Weak lingual manipulation;Reduced posterior propulsion;Premature spillage Oral - Nectar Cup Right anterior bolus loss;Weak lingual manipulation;Reduced posterior propulsion;Premature spillage;Decreased bolus cohesion Oral - Nectar Straw -- Oral - Thin Teaspoon -- Oral - Thin Cup Right anterior bolus loss;Weak lingual manipulation;Piecemeal swallowing;Decreased bolus cohesion;Premature spillage Oral - Thin Straw -- Oral - Puree Weak lingual manipulation;Reduced posterior propulsion;Delayed oral transit;Decreased bolus cohesion;Premature spillage;Lingual/palatal  residue Oral - Mech Soft -- Oral - Regular -- Oral - Multi-Consistency -- Oral - Pill -- Oral Phase - Comment --  CHL IP PHARYNGEAL PHASE 03/08/2019 Pharyngeal Phase WFL Pharyngeal- Pudding Teaspoon -- Pharyngeal -- Pharyngeal- Pudding Cup -- Pharyngeal -- Pharyngeal- Honey Teaspoon -- Pharyngeal -- Pharyngeal- Honey Cup Delayed swallow initiation-vallecula;Reduced pharyngeal peristalsis;Reduced tongue base retraction;Reduced airway/laryngeal closure;Penetration/Aspiration before swallow;Penetration/Aspiration during swallow;Pharyngeal residue - valleculae Pharyngeal Material enters airway, remains ABOVE vocal cords and not ejected out Pharyngeal- Nectar Teaspoon -- Pharyngeal -- Pharyngeal- Nectar Cup Delayed swallow initiation-vallecula;Reduced pharyngeal peristalsis;Reduced tongue base retraction;Reduced airway/laryngeal closure;Penetration/Aspiration before swallow;Penetration/Aspiration during swallow;Pharyngeal residue - valleculae Pharyngeal Material enters airway, remains ABOVE vocal cords and not ejected out Pharyngeal- Nectar Straw -- Pharyngeal -- Pharyngeal- Thin Teaspoon -- Pharyngeal -- Pharyngeal- Thin Cup Delayed swallow initiation-pyriform sinuses;Reduced anterior laryngeal mobility;Reduced laryngeal elevation;Reduced airway/laryngeal closure;Reduced tongue base retraction;Penetration/Aspiration before swallow;Penetration/Aspiration during swallow;Moderate aspiration;Pharyngeal residue - pyriform Pharyngeal Material enters airway, passes BELOW cords and not ejected out despite cough attempt by patient Pharyngeal- Thin Straw -- Pharyngeal -- Pharyngeal- Puree Delayed swallow initiation-vallecula;Reduced pharyngeal peristalsis;Reduced tongue base retraction;Penetration/Aspiration before swallow;Penetration/Aspiration during swallow;Penetration/Apiration after swallow;Pharyngeal residue - valleculae Pharyngeal Material enters airway, remains ABOVE vocal cords and not ejected out Pharyngeal- Mechanical  Soft -- Pharyngeal -- Pharyngeal- Regular -- Pharyngeal -- Pharyngeal- Multi-consistency -- Pharyngeal -- Pharyngeal- Pill -- Pharyngeal -- Pharyngeal Comment --  No flowsheet data found. Kerrie Pleasure, MA, CCC-SLP Acute Rehabilitation Services Office: [phone] 03/08/2019, 12:58 PM              DG Swallowing Func-Speech Pathology  Result Date: 02/28/2019 Objective Swallowing Evaluation: Type of Study: MBS-Modified Barium Swallow Study  Patient Details Name: Maxamilian Amadon MRN: [id number] Date of Birth: [birth year] Today's Date: 02/28/2019 Time: SLP Start Time (ACUTE ONLY): 1440 -SLP Stop Time (ACUTE ONLY): 1502 SLP Time Calculation (min) (ACUTE ONLY): 22 min Past Medical History: Past Medical History: Diagnosis Date . Bronchitis  . CHF (congestive heart failure) (HCC)   grade 1 diastolic with preserved EF in 6578 . COPD (chronic obstructive pulmonary disease) (HCC)  . Diabetes mellitus without complication (HCC)  . DM type 2 with diabetic peripheral neuropathy (HCC)  . Hearing loss  .  Hypercholesteremia  . Hypertension  . Peripheral neuropathy  . Sleep apnea   Severe sleep apnea requiring tracheostomy Past Surgical History: Past Surgical History: Procedure Laterality Date . APPENDECTOMY   . CHOLECYSTECTOMY N/A 01/05/2014  Procedure: LAPAROSCOPIC CHOLECYSTECTOMY;  Surgeon: Emelia Loron, MD;  Location: [facility];  Service: General;  Laterality: N/A; . FEMUR HARDWARE REMOVAL  09/01/2003  Removal of retained hardware, two interlocking distal femoral . FEMUR SURGERY   . HAND SURGERY   . MULTIPLE EXTRACTIONS WITH ALVEOLOPLASTY N/A 05/11/2012  Procedure: MULTIPLE EXTRACTION WITH ALVEOLOPLASTY;  Surgeon: [name], DDS;  Location: [facility];  Service: Oral Surgery;  Laterality: N/A; . TRACHEOSTOMY   HPI: Tyrone Cunningham is a 64 y.o. male with medical history significant of CHF, diabetes, chronic pain syndrome, was brought to ED via EMS from [facility] nursing facility for evaluation of altered mental status. Per nursing home  staff, pt appears to have right sided facial drooping, and right-sided weakness. 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  No previous ST notes in Epic.  Pt reported that he does not wear a PMV and usually finger occludes or mouths words to communicate.  MBS 02/22/19 with recs for NPO with AMN  Subjective: alert Assessment / Plan / Recommendation CHL IP CLINICAL IMPRESSIONS 02/28/2019 Clinical Impression Pt presents with much improved swallow function marked by persisting but improved oral dysphagia, with only mild anterior loss. Pharyngeal phase significantly improved with improved timeliness of swallow onset, reliable laryngeal vestibule closure with large, successive swallows of nectar (no penetration/aspiration), and only one incident of penetration with thin liquids; no aspiration. Pt is safe to begin a dysphagia 1 diet with thin liquids; PMV must be in place for all POs; crush meds.   SLP Visit Diagnosis Dysphagia, oropharyngeal phase (R13.12);Aphonia (R49.1) Attention and concentration deficit following -- Frontal lobe and executive function deficit following -- Impact on safety and function Mild aspiration risk   CHL IP TREATMENT RECOMMENDATION 02/28/2019 Treatment Recommendations Therapy as outlined in treatment plan below   Prognosis 02/28/2019 Prognosis for Safe Diet Advancement Good Barriers to Reach Goals -- Barriers/Prognosis Comment -- CHL IP DIET RECOMMENDATION 02/28/2019 SLP Diet Recommendations Dysphagia 1 (Puree) solids;Thin liquid Liquid Administration via Cup Medication Administration Crushed with puree Compensations Minimize environmental distractions;Small sips/bites Postural Changes --   CHL IP OTHER RECOMMENDATIONS 02/28/2019 Recommended Consults -- Oral Care Recommendations Oral care BID Other Recommendations --   CHL IP FOLLOW UP RECOMMENDATIONS 02/23/2019 Follow up Recommendations Skilled Nursing facility   [facility] IP FREQUENCY AND DURATION 02/22/2019  Speech Therapy Frequency (ACUTE ONLY) min 2x/week Treatment Duration 2 weeks      CHL IP ORAL PHASE 02/28/2019 Oral Phase Impaired Oral - Pudding Teaspoon -- Oral - Pudding Cup -- Oral - Honey Teaspoon -- Oral - Honey Cup -- Oral - Nectar Teaspoon -- Oral - Nectar Cup -- Oral - Nectar Straw -- Oral - Thin Teaspoon -- Oral - Thin Cup -- Oral - Thin Straw -- Oral - Puree -- Oral - Mech Soft -- Oral - Regular -- Oral - Multi-Consistency -- Oral - Pill -- Oral Phase - Comment --  CHL IP PHARYNGEAL PHASE 02/28/2019 Pharyngeal Phase Impaired Pharyngeal- Pudding Teaspoon -- Pharyngeal -- Pharyngeal- Pudding Cup -- Pharyngeal -- Pharyngeal- Honey Teaspoon -- Pharyngeal -- Pharyngeal- Honey Cup -- Pharyngeal -- Pharyngeal- Nectar Teaspoon -- Pharyngeal -- Pharyngeal- Nectar Cup Delayed swallow initiation-pyriform sinuses;Pharyngeal residue - valleculae Pharyngeal Material does not enter airway Pharyngeal- Nectar Straw Delayed swallow initiation-vallecula;Pharyngeal  residue - valleculae Pharyngeal Material does not enter airway Pharyngeal- Thin Teaspoon -- Pharyngeal -- Pharyngeal- Thin Cup Delayed swallow initiation-pyriform sinuses Pharyngeal Material does not enter airway Pharyngeal- Thin Straw Delayed swallow initiation-pyriform sinuses;Penetration/Aspiration before swallow Pharyngeal Material enters airway, remains ABOVE vocal cords and not ejected out Pharyngeal- Puree -- Pharyngeal -- Pharyngeal- Mechanical Soft -- Pharyngeal -- Pharyngeal- Regular -- Pharyngeal -- Pharyngeal- Multi-consistency -- Pharyngeal -- Pharyngeal- Pill -- Pharyngeal -- Pharyngeal Comment --  No flowsheet data found. Blenda Mounts Cunningham 02/28/2019, 4:13 PM              DG Swallowing Func-Speech Pathology  Result Date: 02/22/2019 Objective Swallowing Evaluation: Type of Study: MBS-Modified Barium Swallow Study  Patient Details Name: Digby Groeneveld MRN: [id number] Date of Birth: [birth year] Today's Date: 02/22/2019 Time: SLP Start Time (ACUTE  ONLY): 1320 -SLP Stop Time (ACUTE ONLY): 1350 SLP Time Calculation (min) (ACUTE ONLY): 30 min Past Medical History: Past Medical History: Diagnosis Date . Bronchitis  . CHF (congestive heart failure) (HCC)   grade 1 diastolic with preserved EF in 4098 . COPD (chronic obstructive pulmonary disease) (HCC)  . Diabetes mellitus without complication (HCC)  . DM type 2 with diabetic peripheral neuropathy (HCC)  . Hearing loss  . Hypercholesteremia  . Hypertension  . Peripheral neuropathy  . Sleep apnea   Severe sleep apnea requiring tracheostomy Past Surgical History: Past Surgical History: Procedure Laterality Date . APPENDECTOMY   . CHOLECYSTECTOMY N/A 01/05/2014  Procedure: LAPAROSCOPIC CHOLECYSTECTOMY;  Surgeon: Emelia Loron, MD;  Location: [facility];  Service: General;  Laterality: N/A; . FEMUR HARDWARE REMOVAL  09/01/2003  Removal of retained hardware, two interlocking distal femoral . FEMUR SURGERY   . HAND SURGERY   . MULTIPLE EXTRACTIONS WITH ALVEOLOPLASTY N/A 05/11/2012  Procedure: MULTIPLE EXTRACTION WITH ALVEOLOPLASTY;  Surgeon: [name], DDS;  Location: [facility];  Service: Oral Surgery;  Laterality: N/A; . TRACHEOSTOMY   HPI: Yug Loria is a 64 y.o. male with medical history significant of CHF, diabetes, chronic pain syndrome, was brought to ED via EMS from [facility] nursing facility for evaluation of altered mental status. Per nursing home staff, pt appears to have right sided facial drooping, and right-sided weakness. 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  No previous ST notes in Epic.  Pt reported that he does not wear a PMV and usually finger occludes or mouths words to communicate.   Subjective: alert Assessment / Plan / Recommendation CHL IP CLINICAL IMPRESSIONS 02/22/2019 Clinical Impression Pt participated in modified barium swallow - positioning and body habitus prevented adequate visualization of structures. PMV was worn during study. Pt presented with a  moderate oral and pharyngeal dysphagia marked by poor oral control and organization, leading to diffuse spread of barium throughout oral cavity and passively into pharynx.  There was continual lingual rocking noted in an effort to propel bolus, often ineffectually.  There was aspiration of thin and nectar liquids before, during, and after the swallow.  Laryngeal vestibule closure was generally incomplete, with poor arytenoid to epiglottic approximation and inadequate epiglottic inversion (on fewer than 10% of occasions, epiglottis did completely invert).  Aspiration elicited a consistent cough response - it did not effectively clear aspirate from the trachea/larynx.  There was pharyngeal residue post-swallow.  No solids were given due to poor ability to clear liquids through pharynx.  For now, recommend continued NPO; consider cortrak.  Will direct therapeutic efforts at timing/strengthening/control of swallow.  Notified MD and RN re:  results.  SLP Visit Diagnosis Dysphagia, oropharyngeal phase (R13.12) Attention and concentration deficit following -- Frontal lobe and executive function deficit following -- Impact on safety and function Severe aspiration risk   CHL IP TREATMENT RECOMMENDATION 02/22/2019 Treatment Recommendations Therapy as outlined in treatment plan below   Prognosis 02/22/2019 Prognosis for Safe Diet Advancement Fair Barriers to Reach Goals Language deficits Barriers/Prognosis Comment -- CHL IP DIET RECOMMENDATION 02/22/2019 SLP Diet Recommendations NPO;Alternative means - temporary Liquid Administration via -- Medication Administration Via alternative means Compensations -- Postural Changes --   CHL IP OTHER RECOMMENDATIONS 02/22/2019 Recommended Consults -- Oral Care Recommendations Oral care QID Other Recommendations --   CHL IP FOLLOW UP RECOMMENDATIONS 02/22/2019 Follow up Recommendations Skilled Nursing facility   [facility] IP FREQUENCY AND DURATION 02/22/2019 Speech Therapy Frequency (ACUTE ONLY)  min 2x/week Treatment Duration 2 weeks      CHL IP ORAL PHASE 02/22/2019 Oral Phase Impaired Oral - Pudding Teaspoon -- Oral - Pudding Cup -- Oral - Honey Teaspoon -- Oral - Honey Cup -- Oral - Nectar Teaspoon Right anterior bolus loss;Weak lingual manipulation;Lingual pumping;Reduced posterior propulsion;Right pocketing in lateral sulci;Pocketing in anterior sulcus;Piecemeal swallowing;Decreased bolus cohesion Oral - Nectar Cup Right anterior bolus loss;Weak lingual manipulation;Lingual pumping;Reduced posterior propulsion;Right pocketing in lateral sulci;Pocketing in anterior sulcus;Piecemeal swallowing;Decreased bolus cohesion Oral - Nectar Straw -- Oral - Thin Teaspoon -- Oral - Thin Cup Right anterior bolus loss;Weak lingual manipulation;Lingual pumping;Reduced posterior propulsion;Right pocketing in lateral sulci;Pocketing in anterior sulcus;Piecemeal swallowing;Decreased bolus cohesion Oral - Thin Straw Right anterior bolus loss;Weak lingual manipulation;Lingual pumping;Reduced posterior propulsion;Right pocketing in lateral sulci;Pocketing in anterior sulcus;Piecemeal swallowing;Decreased bolus cohesion Oral - Puree -- Oral - Mech Soft -- Oral - Regular -- Oral - Multi-Consistency -- Oral - Pill -- Oral Phase - Comment --  CHL IP PHARYNGEAL PHASE 02/22/2019 Pharyngeal Phase Impaired Pharyngeal- Pudding Teaspoon -- Pharyngeal -- Pharyngeal- Pudding Cup -- Pharyngeal -- Pharyngeal- Honey Teaspoon -- Pharyngeal -- Pharyngeal- Honey Cup -- Pharyngeal -- Pharyngeal- Nectar Teaspoon -- Pharyngeal -- Pharyngeal- Nectar Cup Delayed swallow initiation-pyriform sinuses;Reduced pharyngeal peristalsis;Reduced epiglottic inversion;Reduced anterior laryngeal mobility;Reduced laryngeal elevation;Reduced airway/laryngeal closure;Reduced tongue base retraction;Penetration/Aspiration before swallow;Penetration/Aspiration during swallow;Penetration/Apiration after swallow;Pharyngeal residue - valleculae Pharyngeal Material  enters airway, passes BELOW cords and not ejected out despite cough attempt by patient Pharyngeal- Nectar Straw Delayed swallow initiation-pyriform sinuses;Reduced pharyngeal peristalsis;Reduced epiglottic inversion;Reduced anterior laryngeal mobility;Reduced laryngeal elevation;Reduced airway/laryngeal closure;Reduced tongue base retraction;Penetration/Aspiration before swallow;Penetration/Aspiration during swallow;Penetration/Apiration after swallow;Pharyngeal residue - valleculae Pharyngeal Material enters airway, passes BELOW cords and not ejected out despite cough attempt by patient Pharyngeal- Thin Teaspoon -- Pharyngeal -- Pharyngeal- Thin Cup Delayed swallow initiation-pyriform sinuses;Reduced pharyngeal peristalsis;Reduced epiglottic inversion;Reduced anterior laryngeal mobility;Reduced laryngeal elevation;Reduced airway/laryngeal closure;Reduced tongue base retraction;Penetration/Aspiration before swallow;Penetration/Aspiration during swallow;Penetration/Apiration after swallow;Pharyngeal residue - valleculae Pharyngeal Material enters airway, passes BELOW cords and not ejected out despite cough attempt by patient Pharyngeal- Thin Straw Delayed swallow initiation-pyriform sinuses;Reduced pharyngeal peristalsis;Reduced epiglottic inversion;Reduced anterior laryngeal mobility;Reduced laryngeal elevation;Reduced airway/laryngeal closure;Reduced tongue base retraction;Penetration/Aspiration before swallow;Penetration/Aspiration during swallow;Penetration/Apiration after swallow;Pharyngeal residue - valleculae Pharyngeal Material enters airway, passes BELOW cords and not ejected out despite cough attempt by patient Pharyngeal- Puree -- Pharyngeal -- Pharyngeal- Mechanical Soft -- Pharyngeal -- Pharyngeal- Regular -- Pharyngeal -- Pharyngeal- Multi-consistency -- Pharyngeal -- Pharyngeal- Pill -- Pharyngeal -- Pharyngeal Comment --  No flowsheet data found. Tyrone Cunningham 02/22/2019, 2:58 PM               ECHOCARDIOGRAM COMPLETE  Result Date: 02/20/2019   ECHOCARDIOGRAM REPORT  Patient Name:   TUDOR CHANDLEY Date of Exam: 02/20/2019 Medical Rec #:  [id number]  Height:       67.0 in Accession #:    [id number] Weight:       270.0 lb Date of Birth:  January 24, 1956   BSA:          2.30 m Patient Age:    63 years   BP:           88/67 mmHg Patient Gender: M          HR:           97 bpm. Exam Location:  Inpatient Procedure: 2D Echo, Color Doppler, Cardiac Doppler and Intracardiac            Opacification Agent Indications:    Stroke  History:        Patient has prior history of Echocardiogram examinations, most                 recent 04/25/2015. CHF, COPD; Risk Factors:Hypertension,                 Diabetes, Dyslipidemia and Sleep Apnea.  Sonographer:    Irving Burton Senior RDCS Referring Phys: [id number] [facility] General  Sonographer Comments: Technically challenging study due to poor, limited acoustic windows and patient is morbidly obese. IMPRESSIONS  1. Left ventricular ejection fraction, by visual estimation, is 55 to 60%. The left ventricle has normal function. There is no left ventricular hypertrophy.  2. Definity contrast agent was given IV to delineate the left ventricular endocardial borders.  3. Left ventricular diastolic function could not be evaluated.  4. The left ventricle has no regional wall motion abnormalities.  5. Global right ventricle was not well visualized.The right ventricular size is not well visualized. Right vetricular wall thickness was not assessed.  6. Left atrial size was not well visualized.  7. Right atrial size was not well visualized.  8. The mitral valve was not well visualized. No evidence of mitral valve regurgitation.  9. The tricuspid valve is not well visualized. Tricuspid valve regurgitation is not demonstrated. 10. The aortic valve was not well visualized. Aortic valve regurgitation is not visualized. 11. The pulmonic valve was not well visualized. Pulmonic valve regurgitation is not visualized.  12. The aortic root was not well visualized. 13. Very technically difficult study, even with use of echo contrast. Contrast shows normal EF without significant wall motion abnormalities. Limited ability to assess any other features due to body habitus and limited echo windows. 14. The interatrial septum was not well visualized. FINDINGS  Left Ventricle: Left ventricular ejection fraction, by visual estimation, is 55 to 60%. The left ventricle has normal function. Definity contrast agent was given IV to delineate the left ventricular endocardial borders. The left ventricle has no regional wall motion abnormalities. There is no left ventricular hypertrophy. Left ventricular diastolic function could not be evaluated. Right Ventricle: The right ventricular size is not well visualized. Right vetricular wall thickness was not assessed. Global RV systolic function is was not well visualized. Left Atrium: Left atrial size was not well visualized. Right Atrium: Right atrial size was not well visualized Pericardium: There is no evidence of pericardial effusion. Mitral Valve: The mitral valve was not well visualized. No evidence of mitral valve regurgitation. Tricuspid Valve: The tricuspid valve is not well visualized. Tricuspid valve regurgitation is not demonstrated. Aortic Valve: The aortic valve was not well visualized. Aortic valve regurgitation is not visualized. Pulmonic Valve: The pulmonic valve  was not well visualized. Pulmonic valve regurgitation is not visualized. Pulmonic regurgitation is not visualized. Aorta: The aortic root was not well visualized. Venous: The inferior vena cava was not well visualized. IAS/Shunts: The interatrial septum was not well visualized.  RIGHT VENTRICLE RV S prime:     12.60 cm/s TAPSE (M-mode): 2.1 cm LEFT ATRIUM             Index       RIGHT ATRIUM           Index LA Vol (A2C):   26.7 ml 11.62 ml/m RA Area:     16.40 cm LA Vol (A4C):   40.4 ml 17.59 ml/m RA Volume:   43.30 ml   18.85 ml/m LA Biplane Vol: 33.6 ml 14.63 ml/m  AORTIC VALVE LVOT Vmax:   64.00 cm/s LVOT Vmean:  46.400 cm/s LVOT VTI:    0.117 m  SHUNTS Systemic VTI: 0.12 m  Jodelle Red MD Electronically signed by Jodelle Red MD Signature Date/Time: 02/20/2019/4:51:27 PM    Final        Subjective:   Discharge Exam: Vitals:   03/13/19 0815 03/13/19 1129  BP:  (!) 108/51  Pulse:  94  Resp:  18  Temp:  98.7 F (37.1 C)  SpO2: 94%    Vitals:   03/13/19 0018 03/13/19 0447 03/13/19 0815 03/13/19 1129  BP:    (!) 108/51  Pulse: (!) 101 100  94  Resp: 14 16  18   Temp:    98.7 F (37.1 C)  TempSrc:    Oral  SpO2: 92% 92% 94%   Weight:      Height:        General: Pt is alert, awake, not in acute distress Cardiovascular: RRR, S1/S2 +, no rubs, no gallops Respiratory: CTA bilaterally, no wheezing, no rhonchi Abdominal: Soft, NT, ND, bowel sounds + Extremities: no edema, no cyanosis    The results of significant diagnostics from this hospitalization (including imaging, microbiology, ancillary and laboratory) are listed below for reference.     Microbiology: Recent Results (from the past 240 hour(s))  Culture, respiratory     Status: None   Collection Time: 03/03/19  7:18 PM   Specimen: Tracheal Aspirate  Result Value Ref Range Status   Specimen Description TRACHEAL ASPIRATE  Final   Special Requests NONE  Final   Gram Stain   Final    FEW SQUAMOUS EPITHELIAL CELLS PRESENT ABUNDANT WBC PRESENT, PREDOMINANTLY PMN ABUNDANT GRAM POSITIVE RODS RARE GRAM POSITIVE COCCI Performed at [facility], [address], [place], [place] [address]    Culture ABUNDANT STAPHYLOCOCCUS EPIDERMIDIS  Final   Report Status 03/05/2019 FINAL  Final   Organism ID, Bacteria STAPHYLOCOCCUS EPIDERMIDIS  Final      Susceptibility   Staphylococcus epidermidis - MIC*    CIPROFLOXACIN 1 SENSITIVE Sensitive     ERYTHROMYCIN >=8 RESISTANT Resistant     GENTAMICIN <=0.5 SENSITIVE  Sensitive     OXACILLIN >=4 RESISTANT Resistant     TETRACYCLINE >=16 RESISTANT Resistant     VANCOMYCIN 1 SENSITIVE Sensitive     TRIMETH/SULFA 160 RESISTANT Resistant     CLINDAMYCIN <=0.25 SENSITIVE Sensitive     RIFAMPIN <=0.5 SENSITIVE Sensitive     Inducible Clindamycin NEGATIVE Sensitive     * ABUNDANT STAPHYLOCOCCUS EPIDERMIDIS     Labs: BNP (last 3 results) No results for input(s): BNP in the last 8760 hours. Basic Metabolic Panel: Recent Labs  Lab 03/10/19 0454  NA  143  K 4.3  CL 97*  CO2 34*  GLUCOSE 83  BUN 22  CREATININE 1.20  CALCIUM 8.2*  MG 2.2   Liver Function Tests: No results for input(s): AST, ALT, ALKPHOS, BILITOT, PROT, ALBUMIN in the last 168 hours. No results for input(s): LIPASE, AMYLASE in the last 168 hours. No results for input(s): AMMONIA in the last 168 hours. CBC: Recent Labs  Lab 03/07/19 0236 03/10/19 0558  WBC 11.2* 9.9  HGB 14.0 14.5  HCT 43.7 45.1  MCV 93.8 94.0  PLT 229 240   Cardiac Enzymes: No results for input(s): CKTOTAL, CKMB, CKMBINDEX, TROPONINI in the last 168 hours. BNP: Invalid input(s): POCBNP CBG: Recent Labs  Lab 03/11/19 1612 03/11/19 1957 03/11/19 2357 03/12/19 0313 03/12/19 0741  GLUCAP 91 120* 109* 104* 122*   D-Dimer No results for input(s): DDIMER in the last 72 hours. Hgb A1c No results for input(s): HGBA1C in the last 72 hours. Lipid Profile No results for input(s): CHOL, HDL, LDLCALC, TRIG, CHOLHDL, LDLDIRECT in the last 72 hours. Thyroid function studies No results for input(s): TSH, T4TOTAL, T3FREE, THYROIDAB in the last 72 hours.  Invalid input(s): FREET3 Anemia work up No results for input(s): VITAMINB12, FOLATE, FERRITIN, TIBC, IRON, RETICCTPCT in the last 72 hours. Urinalysis    Component Value Date/Time   COLORURINE YELLOW 02/19/2019 2050   APPEARANCEUR CLEAR 02/19/2019 2050   LABSPEC 1.041 (H) 02/19/2019 2050   PHURINE 5.0 02/19/2019 2050   GLUCOSEU 50 (A) 02/19/2019 2050    HGBUR SMALL (A) 02/19/2019 2050   BILIRUBINUR NEGATIVE 02/19/2019 2050   BILIRUBINUR neg 01/03/2014 1527   KETONESUR NEGATIVE 02/19/2019 2050   PROTEINUR 100 (A) 02/19/2019 2050   UROBILINOGEN 1.0 01/03/2014 1814   NITRITE NEGATIVE 02/19/2019 2050   LEUKOCYTESUR NEGATIVE 02/19/2019 2050   Sepsis Labs Invalid input(s): PROCALCITONIN,  WBC,  LACTICIDVEN Microbiology Recent Results (from the past 240 hour(s))  Culture, respiratory     Status: None   Collection Time: 03/03/19  7:18 PM   Specimen: Tracheal Aspirate  Result Value Ref Range Status   Specimen Description TRACHEAL ASPIRATE  Final   Special Requests NONE  Final   Gram Stain   Final    FEW SQUAMOUS EPITHELIAL CELLS PRESENT ABUNDANT WBC PRESENT, PREDOMINANTLY PMN ABUNDANT GRAM POSITIVE RODS RARE GRAM POSITIVE COCCI Performed at [facility], [address] [place], [place] [address]    Culture ABUNDANT STAPHYLOCOCCUS EPIDERMIDIS  Final   Report Status 03/05/2019 FINAL  Final   Organism ID, Bacteria STAPHYLOCOCCUS EPIDERMIDIS  Final      Susceptibility   Staphylococcus epidermidis - MIC*    CIPROFLOXACIN 1 SENSITIVE Sensitive     ERYTHROMYCIN >=8 RESISTANT Resistant     GENTAMICIN <=0.5 SENSITIVE Sensitive     OXACILLIN >=4 RESISTANT Resistant     TETRACYCLINE >=16 RESISTANT Resistant     VANCOMYCIN 1 SENSITIVE Sensitive     TRIMETH/SULFA 160 RESISTANT Resistant     CLINDAMYCIN <=0.25 SENSITIVE Sensitive     RIFAMPIN <=0.5 SENSITIVE Sensitive     Inducible Clindamycin NEGATIVE Sensitive     * ABUNDANT STAPHYLOCOCCUS EPIDERMIDIS     Time coordinating discharge: Over 30 minutes  SIGNED:   Burke Keelshristopher Lynnette Pote, MD  [facility] 03/13/2019, 11:34 AM Pager   If 7PM-7AM, please contact night-coverage [email or web address] Password [email or web address]

## 2019-03-13 NOTE — Progress Notes (Signed)
Daily Progress Note   Patient Name: Tyrone Cunningham       Date: 03/13/2019 DOB: 1955/08/03  Age: 64 y.o. MRN#: [id number] Attending Physician: Marzetta Board* Primary Care Physician: System, Pcp Not In Admit Date: 02/19/2019  Reason for Consultation/Follow-up: Establishing goals of care, Non pain symptom management and Psychosocial/spiritual support  Subjective: Patient indicates that he is not in pain and there is nothing I can get for him.  I noticed him touching his mouth - mouth and lips are very dry.  I utilized the green sponges to wet his mouth and apply moisturizing gel.   Put his PMV on to allow him to speak to me, but he did not.   Patient appeared sedate.   I conferenced his brother Tyrone Cunningham on via FaceTime.  Tyrone Cunningham prayed with Tyrone Cunningham and asked him to accept the Lord into his heart as his savior.  Tyrone Cunningham expressed appreciation for the call.   Assessment: Patient declining.  Not vocal (which is a change).  Seems frustrated.   Patient Profile/HPI:   64 y.o. male  with past medical history of  COPD, chronic pain, OSA with chronic tracheostomy, HFpEF, DM, and hearing loss who was admitted on 02/19/2019 from SNF with altered mental status and right sided weakness.  He was found to have a stroke and pneumonia.  He has failed his swallow study and refuses a PEG tube.  Length of Stay: 22  Current Medications: Scheduled Meds:  .  stroke: mapping our early stages of recovery book   Does not apply Once  . digoxin  0.25 mg Oral Daily  . feeding supplement (ENSURE ENLIVE)  237 mL Oral BID BM  . insulin glargine  10 Units Subcutaneous Daily  . ipratropium-albuterol  3 mL Nebulization BID  .  morphine injection  2 mg Intravenous Q6H  . Resource ThickenUp Clear   Oral Once  . sodium chloride  flush  10-40 mL Intracatheter Q12H    Continuous Infusions: . sodium chloride Stopped (02/28/19 2112)  . dextrose 25 mL/hr at 03/12/19 2305  . valproate sodium 500 mg (03/13/19 0512)    PRN Meds: sodium chloride, acetaminophen **OR** acetaminophen (TYLENOL) oral liquid 160 mg/5 mL **OR** acetaminophen, albuterol, antiseptic oral rinse, glycopyrrolate **OR** glycopyrrolate **OR** glycopyrrolate, haloperidol **OR** haloperidol **OR** haloperidol lactate, haloperidol lactate, LORazepam, morphine injection, ondansetron **  OR** ondansetron (ZOFRAN) IV, polyvinyl alcohol, sodium chloride flush  Physical Exam        Obese gentleman with Chronic trach.  Not verbal even when PMV applied. (this is different), patient is clean and dry. Appears dehydrated (skin on hands and arms as well as patient's mouth and tongue) CV rrr resp no distress  Abdomen obese, nt, nd  Vital Signs: BP (!) 142/55 (BP Location: Right Arm)   Pulse 100   Temp 98.4 F (36.9 C) (Axillary)   Resp 16   Ht 5\' 7"  (1.702 m)   Wt 116.1 kg   SpO2 94%   BMI 40.10 kg/m  SpO2: SpO2: 94 % O2 Device: O2 Device: Tracheostomy Collar O2 Flow Rate: O2 Flow Rate (L/min): 8 L/min  Intake/output summary:   Intake/Output Summary (Last 24 hours) at 03/13/2019 1128 Last data filed at 03/13/2019 0446 Gross per 24 hour  Intake --  Output 250 ml  Net -250 ml   LBM: Last BM Date: 03/12/19 Baseline Weight: Weight: 122.5 kg Most recent weight: Weight: 116.1 kg       Palliative Assessment/Data: 10%    Flowsheet Rows     Most Recent Value  Intake Tab  Referral Department  Hospitalist  Unit at Time of Referral  Med/Surg Unit  Palliative Care Primary Diagnosis  Neurology  Date Notified  02/24/19  Palliative Care Type  New Palliative care  Reason for referral  Clarify Goals of Care  Date of Admission  02/19/19  Date first seen by Palliative Care  02/25/19  # of days Palliative referral response time  1 Day(s)  # of days IP prior  to Palliative referral  5  Clinical Assessment  Psychosocial & Spiritual Assessment  Palliative Care Outcomes      Patient Active Problem List   Diagnosis Date Noted  . CHF (congestive heart failure) (HCC)   . DNR (do not resuscitate)   . Acute on chronic respiratory failure (HCC)   . Aspiration into airway   . Aspiration pneumonia of left lung (HCC)   . Hemiparesis of right dominant side (HCC)   . Chronic diastolic CHF (congestive heart failure) (HCC) 02/23/2019  . 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 HCAP (healthcare-associated pneumonia) 10/09/2017  . Hypokalemia 10/09/2017  . Abnormal liver function 10/09/2017  . Severe protein-calorie malnutrition (HCC) 10/09/2017  . Cellulitis and abscess of left leg 08/04/2017  . Diabetic foot ulcer (HCC) 08/04/2017  . Acute renal failure (ARF) (HCC) 08/04/2017  . SOB (shortness of breath) 09/13/2016  . Peripheral vascular disease (HCC) 08/19/2016  . Tracheostomy care (HCC) 06/28/2015  . Obstructive sleep apnea 06/28/2015  . Chronic pain syndrome 04/18/2015  . Constipation 01/07/2014  . Constipation, chronic 01/04/2014  . Chronic respiratory failure (HCC) 01/06/2013  . Palliative care encounter 03/16/2012  . Edema 03/16/2012  . Obesity 10/20/2011  . 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 [name] with frequent exacerbations   . Hypertension   . Hypercholesteremia   . Hearing loss   . Diabetic neuropathy [facility])     Palliative Care Plan    Recommendations/Plan:  Continue all measures to make the patient as comfortable as possible.  Will increase frequency of oral care  Please utilize PMV with giving any comfort feeds or medications.  It helps him avoid aspiration.  Will DC to [facility] when bed available.  Goals of Care and Additional Recommendations:  Limitations on Scope of Treatment:  Full Comfort Care  Code Status:   DNR  Prognosis:  Less than 2 weeks.  Discharge Planning:  Hospice facility  Care plan was discussed with brother Tyrone Cunningham.  Thank you for allowing the Palliative Medicine Team to assist in the care of this patient.  Total time spent:  35 min.     Greater than 50%  of this time was spent counseling and coordinating care related to the above assessment and plan.  Florentina Jenny, PA-C Palliative Medicine  Please contact Palliative MedicineTeam phone at [phone] for questions and concerns between 7 am - 7 pm.   Please see AMION for individual provider pager numbers.

## 2019-03-13 NOTE — Progress Notes (Signed)
Gave report to Hospice nurse and pt was transported to [facility] via [place]. Gave discharge summary to PTAR workers.

## 2019-03-13 NOTE — TOC Initial Note (Signed)
Transition of Care [facility]) - Initial/Assessment Note    Patient Details  Name: Tyrone Cunningham MRN: [id number] Date of Birth: [birth year]  Transition of Care [facility]) CM/SW Contact:    Gelene Mink, [place] Phone Number: 03/13/2019, 9:29 AM  Clinical Narrative:                  TOC following for transition to [facility] once a bed is available. Hospice referral was made on 03/12/2019.   [name] called Bradd Canary with [facility]. She stated she had not heard from [facility] yet as far as bed availability. The patient is second on the waitlist. Delsa Sale stated she would call the [name] once she has an understanding of bed availability.   [name] will continue to follow.   Expected Discharge Plan: [place] Barriers to Discharge: Hospice Bed not available   Patient Goals and CMS Choice Patient states their goals for this hospitalization and ongoing recovery are:: Pt will transition to [facility] Medicare.gov Compare Post Acute Care list provided to:: Patient Represenative (must comment) Choice offered to / list presented to : Sibling  Expected Discharge Plan and Services Expected Discharge Plan: [place] In-house Referral: Clinical Social Work Discharge Planning Services: Other - See comment(Made hospice referral) Post Acute Care Choice: Hospice Living arrangements for the past 2 months: [place]                 DME Arranged: N/A DME Agency: NA       HH Arranged: NA [place] Agency: NA        Prior Living Arrangements/Services Living arrangements for the past 2 months: [place] Lives with:: Facility Resident Patient language and need for interpreter reviewed:: No Do you feel safe going back to the place where you live?: Yes      Need for Family Participation in Patient Care: Yes (Comment) Care giver support system in place?: Yes (comment)   Criminal Activity/Legal Involvement Pertinent to Current Situation/Hospitalization: No -  Comment as needed  Activities of Daily Living      Permission Sought/Granted Permission sought to share information with : Case Manager Permission granted to share information with : Yes, Verbal Permission Granted  Share Information with NAME: Jylan Loeza  Permission granted to share info w AGENCY: [place] granted to share info w Relationship: brother  Permission granted to share info w Contact Information: [phone]  Emotional Assessment Appearance:: Appears stated age Attitude/Demeanor/Rapport: Unable to Assess Affect (typically observed): Unable to Assess Orientation: : Fluctuating Orientation (Suspected and/or reported Sundowners) Alcohol / Substance Use: Not Applicable Psych Involvement: No (comment)  Admission diagnosis:  SOB (shortness of breath) [R06.02] Stroke (cerebrum) (HCC) [I63.9] Hemiparesis of right dominant side, unspecified hemiparesis etiology ([place]) [G81.91] Patient Active Problem List   Diagnosis Date Noted  . DNR (do not resuscitate)   . Acute on chronic respiratory failure ([place])   . Aspiration into airway   . Aspiration pneumonia of left lung ([place])   . Hemiparesis of right dominant side ([place])   . Chronic diastolic CHF (congestive heart failure) ([place]) 02/23/2019  . Stroke (cerebrum) ([place]) 02/19/2019  . HCAP (healthcare-associated pneumonia) 10/09/2017  . Hypokalemia 10/09/2017  . Abnormal liver function 10/09/2017  . Severe protein-calorie malnutrition ([place]) 10/09/2017  . Cellulitis and abscess of left leg 08/04/2017  . Diabetic foot ulcer ([place]) 08/04/2017  . Acute renal failure (ARF) ([place]) 08/04/2017  . SOB (shortness of breath) 09/13/2016  . Peripheral vascular disease ([place])  08/19/2016  . Tracheostomy care (HCC) 06/28/2015  . Obstructive sleep apnea 06/28/2015  . Chronic pain syndrome 04/18/2015  . Constipation 01/07/2014  . Constipation, chronic 01/04/2014  . Chronic respiratory failure (HCC) 01/06/2013  . Palliative care  encounter 03/16/2012  . Edema 03/16/2012  . Obesity 10/20/2011  . 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 [name] with frequent exacerbations   . CHF (congestive heart failure) (HCC)   . Hypertension   . Hypercholesteremia   . Hearing loss   . Diabetic neuropathy [facility])    PCP:  System, Pcp Not In Pharmacy:   [facility] [phone] - [place], [place] - [address] AT [address] & [address]  [address] Phone: [phone] Fax: [phone]  [facility] - [address] [place] - [address] [place] [address] Phone: [phone] Fax: [phone]     Social Determinants of Health (SDOH) Interventions    Readmission Risk Interventions Readmission Risk Prevention Plan 02/23/2019 10/15/2017  Transportation Screening Complete Complete  PCP or Specialist Appt within 5-7 Days Not Complete -  Not Complete comments pt is SNF resident -  Home Care Screening Not Complete Complete  Home Care Screening Not Completed Comments pt is SNF resident -  Medication Review (RN CM) Referral to Pharmacy -  HRI or Home Care Consult - Complete  Medication Review (RN Care Manager) - Complete  Some recent data might be hidden

## 2019-03-13 NOTE — TOC Transition Note (Signed)
Transition of Care [facility]) - CM/SW Discharge Note   Patient Details  Name: Tyrone Cunningham MRN: [id number] Date of Birth: Jun 08, 1955  [facility]) CM/SW Contact:  Gwenlyn Fudge, LCSWA Phone Number: 03/13/2019, 12:34 PM   Clinical Narrative:     Please call [facility] at [phone] to give report to charge nurse. PTAR has been called and arranged.   Family has been notified of discharge.   Final next level of care: Hospice Medical Facility Barriers to Discharge: No Barriers Identified   Patient Goals and CMS Choice Patient states their goals for this hospitalization and ongoing recovery are:: Pt is transitioning to hospice care CMS Medicare.gov Compare Post Acute Care list provided to:: Patient Represenative (must comment) Choice offered to / list presented to : Sibling  Discharge Placement              Patient chooses bed at: Other - please specify in the comment section below:([facility]) Patient to be transferred to facility by: [facility] Name of family member notified: [name] (brother) Patient and family notified of of transfer: 03/13/19  Discharge Plan and Services In-house Referral: Clinical Social Work Discharge Planning Services: Other - See comment(Made hospice referral) Post Acute Care Choice: Hospice          DME Arranged: N/A DME Agency: NA       HH Arranged: NA HH Agency: NA        Social Determinants of Health (SDOH) Interventions     Readmission Risk Interventions Readmission Risk Prevention Plan 02/23/2019 10/15/2017  Transportation Screening Complete Complete  PCP or Specialist Appt within 5-7 Days Not Complete -  Not Complete comments pt is SNF resident -  Home Care Screening Not Complete Complete  Home Care Screening Not Completed Comments pt is SNF resident -  Medication Review (RN CM) Referral to Pharmacy -  HRI or Home Care Consult - Complete  Medication Review (RN Care Manager) - Complete  Some recent data might be hidden

## 2019-03-13 NOTE — Progress Notes (Signed)
[facility] Documentation   Place does have a bed available for pt today. Pts medical chart reviewed by [facility] hospice for eligibility and it was determined that pt is eligible for placement at [facility]. All paperwork has been completed and pt is ready to be accepted into [facility] care.   Please call [facility] at [phone] to give charge nurse report and fax discharge summary to [phone].   Please dc any lines. May leave catheter in place if pt has one. Please send pt to [facility] with DNR paperwork.     Thank you,  Trena Platt, RN

## 2019-04-04 DEATH — deceased
